# Patient Record
Sex: Female | Born: 1940 | Race: White | Hispanic: No | Marital: Married | Smoking: Never smoker
Health system: Southern US, Community
[De-identification: ages and names within clinical notes are randomized; demographics above are authoritative.]

## PROBLEM LIST (undated history)

## (undated) DIAGNOSIS — E785 Hyperlipidemia, unspecified: Secondary | ICD-10-CM

## (undated) DIAGNOSIS — I5189 Other ill-defined heart diseases: Secondary | ICD-10-CM

## (undated) DIAGNOSIS — I10 Essential (primary) hypertension: Secondary | ICD-10-CM

## (undated) DIAGNOSIS — K760 Fatty (change of) liver, not elsewhere classified: Secondary | ICD-10-CM

## (undated) DIAGNOSIS — I2510 Atherosclerotic heart disease of native coronary artery without angina pectoris: Secondary | ICD-10-CM

## (undated) DIAGNOSIS — M1990 Unspecified osteoarthritis, unspecified site: Secondary | ICD-10-CM

## (undated) DIAGNOSIS — I700 Atherosclerosis of aorta: Secondary | ICD-10-CM

## (undated) DIAGNOSIS — C801 Malignant (primary) neoplasm, unspecified: Secondary | ICD-10-CM

## (undated) DIAGNOSIS — Z87442 Personal history of urinary calculi: Secondary | ICD-10-CM

## (undated) DIAGNOSIS — K219 Gastro-esophageal reflux disease without esophagitis: Secondary | ICD-10-CM

## (undated) DIAGNOSIS — M5416 Radiculopathy, lumbar region: Secondary | ICD-10-CM

## (undated) DIAGNOSIS — E119 Type 2 diabetes mellitus without complications: Secondary | ICD-10-CM

## (undated) DIAGNOSIS — I739 Peripheral vascular disease, unspecified: Secondary | ICD-10-CM

## (undated) DIAGNOSIS — R471 Dysarthria and anarthria: Secondary | ICD-10-CM

## (undated) DIAGNOSIS — N200 Calculus of kidney: Secondary | ICD-10-CM

## (undated) DIAGNOSIS — G8929 Other chronic pain: Secondary | ICD-10-CM

## (undated) DIAGNOSIS — G458 Other transient cerebral ischemic attacks and related syndromes: Secondary | ICD-10-CM

## (undated) DIAGNOSIS — N183 Chronic kidney disease, stage 3 unspecified: Secondary | ICD-10-CM

## (undated) DIAGNOSIS — F325 Major depressive disorder, single episode, in full remission: Secondary | ICD-10-CM

## (undated) DIAGNOSIS — I6529 Occlusion and stenosis of unspecified carotid artery: Secondary | ICD-10-CM

## (undated) DIAGNOSIS — I209 Angina pectoris, unspecified: Secondary | ICD-10-CM

## (undated) DIAGNOSIS — Z90710 Acquired absence of both cervix and uterus: Secondary | ICD-10-CM

## (undated) HISTORY — DX: Occlusion and stenosis of unspecified carotid artery: I65.29

## (undated) HISTORY — PX: ABDOMINAL HYSTERECTOMY: SHX81

## (undated) HISTORY — DX: Essential (primary) hypertension: I10

## (undated) HISTORY — DX: Unspecified osteoarthritis, unspecified site: M19.90

## (undated) HISTORY — DX: Calculus of kidney: N20.0

## (undated) HISTORY — DX: Acquired absence of both cervix and uterus: Z90.710

## (undated) HISTORY — PX: CHOLECYSTECTOMY: SHX55

## (undated) HISTORY — DX: Gastro-esophageal reflux disease without esophagitis: K21.9

## (undated) HISTORY — DX: Type 2 diabetes mellitus without complications: E11.9

## (undated) HISTORY — DX: Other transient cerebral ischemic attacks and related syndromes: G45.8

---

## 1978-08-08 HISTORY — PX: AUGMENTATION MAMMAPLASTY: SUR837

## 2000-12-12 ENCOUNTER — Encounter: Payer: Self-pay | Admitting: Neurosurgery

## 2000-12-12 ENCOUNTER — Ambulatory Visit (HOSPITAL_COMMUNITY): Admission: RE | Admit: 2000-12-12 | Discharge: 2000-12-12 | Payer: Self-pay | Admitting: Neurosurgery

## 2000-12-22 ENCOUNTER — Encounter: Admission: RE | Admit: 2000-12-22 | Discharge: 2000-12-22 | Payer: Self-pay | Admitting: Neurosurgery

## 2000-12-22 ENCOUNTER — Encounter: Payer: Self-pay | Admitting: Neurosurgery

## 2001-01-05 ENCOUNTER — Encounter: Admission: RE | Admit: 2001-01-05 | Discharge: 2001-01-05 | Payer: Self-pay | Admitting: Neurosurgery

## 2001-01-05 ENCOUNTER — Encounter: Payer: Self-pay | Admitting: Neurosurgery

## 2001-01-18 ENCOUNTER — Encounter: Payer: Self-pay | Admitting: Neurosurgery

## 2001-01-18 ENCOUNTER — Encounter: Admission: RE | Admit: 2001-01-18 | Discharge: 2001-01-18 | Payer: Self-pay | Admitting: Neurosurgery

## 2004-07-21 ENCOUNTER — Ambulatory Visit: Payer: Self-pay | Admitting: Unknown Physician Specialty

## 2004-12-21 ENCOUNTER — Ambulatory Visit: Payer: Self-pay | Admitting: Unknown Physician Specialty

## 2004-12-23 ENCOUNTER — Ambulatory Visit: Payer: Self-pay | Admitting: Unknown Physician Specialty

## 2005-04-12 ENCOUNTER — Ambulatory Visit: Payer: Self-pay | Admitting: Unknown Physician Specialty

## 2005-08-03 ENCOUNTER — Ambulatory Visit: Payer: Self-pay | Admitting: Unknown Physician Specialty

## 2005-09-29 ENCOUNTER — Encounter: Admission: RE | Admit: 2005-09-29 | Discharge: 2005-09-29 | Payer: Self-pay | Admitting: Neurosurgery

## 2005-10-07 ENCOUNTER — Encounter: Admission: RE | Admit: 2005-10-07 | Discharge: 2005-10-07 | Payer: Self-pay | Admitting: Urology

## 2005-10-19 ENCOUNTER — Encounter: Admission: RE | Admit: 2005-10-19 | Discharge: 2005-10-19 | Payer: Self-pay | Admitting: Neurosurgery

## 2006-08-25 ENCOUNTER — Ambulatory Visit: Payer: Self-pay | Admitting: Unknown Physician Specialty

## 2007-09-19 ENCOUNTER — Ambulatory Visit: Payer: Self-pay | Admitting: Unknown Physician Specialty

## 2008-09-25 ENCOUNTER — Ambulatory Visit: Payer: Self-pay | Admitting: Unknown Physician Specialty

## 2009-09-30 ENCOUNTER — Ambulatory Visit: Payer: Self-pay | Admitting: Unknown Physician Specialty

## 2009-10-29 ENCOUNTER — Ambulatory Visit: Payer: Self-pay | Admitting: Ophthalmology

## 2009-11-11 ENCOUNTER — Ambulatory Visit: Payer: Self-pay | Admitting: Ophthalmology

## 2009-12-15 ENCOUNTER — Ambulatory Visit: Payer: Self-pay | Admitting: Unknown Physician Specialty

## 2010-01-06 DIAGNOSIS — I779 Disorder of arteries and arterioles, unspecified: Secondary | ICD-10-CM | POA: Insufficient documentation

## 2010-02-01 DIAGNOSIS — I6529 Occlusion and stenosis of unspecified carotid artery: Secondary | ICD-10-CM

## 2010-02-01 HISTORY — DX: Occlusion and stenosis of unspecified carotid artery: I65.29

## 2010-08-29 ENCOUNTER — Encounter: Payer: Self-pay | Admitting: Urology

## 2010-10-21 ENCOUNTER — Ambulatory Visit: Payer: Self-pay | Admitting: Unknown Physician Specialty

## 2010-11-18 ENCOUNTER — Ambulatory Visit: Payer: Self-pay | Admitting: Unknown Physician Specialty

## 2011-11-11 ENCOUNTER — Ambulatory Visit: Payer: Self-pay | Admitting: Internal Medicine

## 2012-05-11 ENCOUNTER — Ambulatory Visit: Payer: Self-pay

## 2012-11-14 ENCOUNTER — Ambulatory Visit: Payer: Self-pay

## 2013-01-29 ENCOUNTER — Ambulatory Visit: Payer: Self-pay | Admitting: Physician Assistant

## 2013-07-29 ENCOUNTER — Ambulatory Visit: Payer: Self-pay | Admitting: Podiatry

## 2013-11-15 ENCOUNTER — Ambulatory Visit: Payer: Self-pay

## 2014-01-15 ENCOUNTER — Ambulatory Visit: Payer: Self-pay | Admitting: Ophthalmology

## 2014-02-03 ENCOUNTER — Ambulatory Visit: Payer: Self-pay | Admitting: Ophthalmology

## 2014-04-19 ENCOUNTER — Emergency Department: Payer: Self-pay | Admitting: Emergency Medicine

## 2014-04-19 LAB — COMPREHENSIVE METABOLIC PANEL
Albumin: 4 g/dL (ref 3.4–5.0)
Alkaline Phosphatase: 65 U/L
Anion Gap: 8 (ref 7–16)
BUN: 21 mg/dL — ABNORMAL HIGH (ref 7–18)
Bilirubin,Total: 0.6 mg/dL (ref 0.2–1.0)
CHLORIDE: 102 mmol/L (ref 98–107)
CO2: 26 mmol/L (ref 21–32)
Calcium, Total: 8.6 mg/dL (ref 8.5–10.1)
Creatinine: 1.09 mg/dL (ref 0.60–1.30)
EGFR (Non-African Amer.): 50 — ABNORMAL LOW
GFR CALC AF AMER: 58 — AB
GLUCOSE: 131 mg/dL — AB (ref 65–99)
OSMOLALITY: 277 (ref 275–301)
POTASSIUM: 3.5 mmol/L (ref 3.5–5.1)
SGOT(AST): 26 U/L (ref 15–37)
SGPT (ALT): 71 U/L — ABNORMAL HIGH
SODIUM: 136 mmol/L (ref 136–145)
TOTAL PROTEIN: 8.1 g/dL (ref 6.4–8.2)

## 2014-04-19 LAB — URINALYSIS, COMPLETE
BLOOD: NEGATIVE
Bacteria: NONE SEEN
Bilirubin,UR: NEGATIVE
GLUCOSE, UR: NEGATIVE mg/dL (ref 0–75)
Hyaline Cast: 9
KETONE: NEGATIVE
Leukocyte Esterase: NEGATIVE
Nitrite: NEGATIVE
Ph: 5 (ref 4.5–8.0)
RBC,UR: 4 /HPF (ref 0–5)
SPECIFIC GRAVITY: 1.022 (ref 1.003–1.030)
WBC UR: 4 /HPF (ref 0–5)

## 2014-04-19 LAB — CBC WITH DIFFERENTIAL/PLATELET
BASOS PCT: 0.4 %
Basophil #: 0.1 10*3/uL (ref 0.0–0.1)
EOS PCT: 0.1 %
Eosinophil #: 0 10*3/uL (ref 0.0–0.7)
HCT: 46.4 % (ref 35.0–47.0)
HGB: 15.3 g/dL (ref 12.0–16.0)
Lymphocyte #: 1.8 10*3/uL (ref 1.0–3.6)
Lymphocyte %: 13.9 %
MCH: 30.5 pg (ref 26.0–34.0)
MCHC: 33 g/dL (ref 32.0–36.0)
MCV: 93 fL (ref 80–100)
MONO ABS: 0.8 x10 3/mm (ref 0.2–0.9)
MONOS PCT: 6.2 %
NEUTROS ABS: 10.5 10*3/uL — AB (ref 1.4–6.5)
Neutrophil %: 79.4 %
PLATELETS: 289 10*3/uL (ref 150–440)
RBC: 5.02 10*6/uL (ref 3.80–5.20)
RDW: 13.5 % (ref 11.5–14.5)
WBC: 13.2 10*3/uL — AB (ref 3.6–11.0)

## 2014-04-19 LAB — LIPASE, BLOOD: Lipase: 161 U/L (ref 73–393)

## 2014-11-18 ENCOUNTER — Ambulatory Visit
Admit: 2014-11-18 | Disposition: A | Discharge status: Home / Self Care | Payer: Self-pay | Attending: Infectious Diseases | Admitting: Infectious Diseases

## 2014-11-29 NOTE — Op Note (Signed)
PATIENT NAME:  Cassidy Hernandez, Cassidy Hernandez MR#:  [id number] DATE OF BIRTH:  [birth year]  DATE OF PROCEDURE:  02/03/2014  PREOPERATIVE DIAGNOSIS:  Cataract, right eye.   POSTOPERATIVE DIAGNOSIS:  Cataract, right eye.  PROCEDURE PERFORMED:  Extracapsular cataract extraction using phacoemulsification with placement of an Alcon [id number], 23.0-diopter posterior chamber lens, serial #[id number].  SURGEON:  Loura Back. Dingeldein, MD  ASSISTANT:  None.  ANESTHESIA:  4% lidocaine and 0.75% Marcaine in a 50/50 mixture with 10 units/mL of Hylenex added, given as a peribulbar.   ANESTHESIOLOGIST:  Dr. Julianne Handler   COMPLICATIONS:  None.  ESTIMATED BLOOD LOSS:  Less than 1 ml.  DESCRIPTION OF PROCEDURE:  The patient was brought to the operating room and given a peribulbar block.  The patient was then prepped and draped in the usual fashion.  The vertical rectus muscles were imbricated using 5-0 silk sutures.  These sutures were then clamped to the sterile drapes as bridle sutures.  A limbal peritomy was performed extending two clock hours and hemostasis was obtained with cautery.  A partial thickness scleral groove was made at the surgical limbus and dissected anteriorly in a lamellar dissection using an Alcon crescent knife.  The anterior chamber was entered superonasally with a Superblade and through the lamellar dissection with a 2.6 mm keratome.  DisCoVisc was used to replace the aqueous and a continuous tear capsulorrhexis was carried out.  Hydrodissection and hydrodelineation were carried out with balanced salt and a 27 gauge canula.  The nucleus was rotated to confirm the effectiveness of the hydrodissection.  Phacoemulsification was carried out using a divide-and-conquer technique.  Total ultrasound time was 1 minute and 6.1 seconds with an average power of 18.3 percent and CDE of 19.69.  Irrigation/aspiration was used to remove the residual cortex.  DisCoVisc was used to inflate the capsule and the  internal incision was enlarged to 3 mm with the crescent knife.  The intraocular lens was folded and inserted into the capsular bag using the AcrySert delivery system. Irrigation/aspiration was used to remove the residual DisCoVisc.  Miostat was injected into the anterior chamber through the paracentesis track to inflate the anterior chamber and induce miosis.  A tenth of a milliliter of cefuroxime was injected via the paracentesis tract containing 1 mg of drug. The wound was checked for leaks and none were found. The conjunctiva was closed with cautery and the bridle sutures were removed.  Two drops of 0.3% Vigamox were placed on the eye.   An eye shield was placed on the eye.  The patient was discharged to the recovery room in good condition.  ____________________________ Loura Back Dingeldein, MD sad:sb D: 02/03/2014 13:32:12 ET T: 02/03/2014 14:20:12 ET JOB#: [id number]  cc: Remo Lipps A. Dingeldein, MD, <Dictator> Martie Lee MD ELECTRONICALLY SIGNED 02/05/2014 11:28

## 2014-12-24 HISTORY — PX: CARDIAC CATHETERIZATION: SHX172

## 2014-12-29 ENCOUNTER — Encounter: Payer: Self-pay | Admitting: *Deleted

## 2014-12-29 ENCOUNTER — Encounter: Payer: Medicare Other | Attending: Internal Medicine | Admitting: *Deleted

## 2014-12-29 VITALS — Ht 61.1 in | Wt 182.3 lb

## 2014-12-29 DIAGNOSIS — I208 Other forms of angina pectoris: Secondary | ICD-10-CM | POA: Diagnosis present

## 2014-12-29 NOTE — Patient Instructions (Signed)
Stable Angina Exercise Prescription  Frequency: 3-7 d/wk Intensity: HR 10-15 contractions/min below ischemic threshold; threshold at which symptoms occur  Time: 10-60 minutes per exercise bout Type: Aerobic activity: walking, biking, NuStep  A prolonged warm-up and cool-down (10 min or more) has been shown to have an antianginal effect.   It is very important that the patient be able to define angina, describe his or her unique symptoms, identify provoking factors, describe the immediate treatment of angina (nitroglycerin protocol), and understand his or her upper limits of exercise (including HR, RPE, and angina).

## 2014-12-29 NOTE — Progress Notes (Signed)
Cardiac Individual Treatment Plan  Patient Details  Name: Cassidy Hernandez MRN: [id number] Date of Birth: [birth year] Referring Provider:  Luana Shu  Initial Encounter Date:  12/29/2014  Patient's Home Medications on Admission:  Current outpatient prescriptions:  .  amLODipine (NORVASC) 5 MG tablet, Take by mouth., Disp: , Rfl:  .  atenolol (TENORMIN) 50 MG tablet, Take by mouth., Disp: , Rfl:  .  chlorthalidone (HYGROTON) 25 MG tablet, TAKE 1/2 TABLET EVERY DAY, Disp: , Rfl:  .  nitroGLYCERIN (NITROSTAT) 0.4 MG SL tablet, Place under the tongue., Disp: , Rfl:  .  rosuvastatin (CRESTOR) 20 MG tablet, Take by mouth., Disp: , Rfl:  .  aspirin EC 81 MG tablet, Take by mouth., Disp: , Rfl:  .  losartan (COZAAR) 100 MG tablet, Take by mouth., Disp: , Rfl:  .  Multiple Vitamin (MULTIVITAMIN) capsule, Take by mouth., Disp: , Rfl:  .  omeprazole (PRILOSEC) 20 MG capsule, Take by mouth., Disp: , Rfl:  .  zolpidem (AMBIEN) 10 MG tablet, Take by mouth., Disp: , Rfl:   Past Medical History: No past medical history on file.  Tobacco Use: History  Smoking status  . Not on file  Smokeless tobacco  . Not on file    Labs: Recent Review Flowsheet Data    There is no flowsheet data to display.       Exercise Target Goals:    Exercise Program Goal: Individual exercise prescription set with THRR, safety & activity barriers. Participant demonstrates ability to understand and report RPE using BORG scale, to self-measure pulse accurately, and to acknowledge the importance of the exercise prescription.  Exercise Prescription Goal: Starting with aerobic activity 30 plus minutes a day, 3 days per week for initial exercise prescription. Provide home exercise prescription and guidelines that participant acknowledges understanding prior to discharge.  Activity Barriers & Risk Stratification:     Activity Barriers & Risk Stratification - 12/29/14 1422    Activity Barriers & Risk Stratification    Activity Barriers None   Risk Stratification High      6 Minute Walk:   Initial Exercise Prescription:   Exercise Prescription Changes:   Discharge Exercise Prescription:   Nutrition:  Target Goals: Understanding of nutrition guidelines, daily intake of sodium 1500mg , cholesterol 200mg , calories 30% from fat and 7% or less from saturated fats, daily to have 5 or more servings of fruits and vegetables.  Biometrics:    Nutrition Therapy Plan and Nutrition Goals:   Nutrition Discharge: Rate Your Plate Scores:   Nutrition Goals Re-Evaluation:   Psychosocial: Target Goals: Acknowledge presence or absence of depression, maximize coping skills, provide positive support system. Participant is able to verbalize types and ability to use techniques and skills needed for reducing stress and depression.  Initial Review & Psychosocial Screening:   Quality of Life Scores:   PHQ-9:     Recent Review Flowsheet Data    There is no flowsheet data to display.      Psychosocial Evaluation and Intervention:   Psychosocial Re-Evaluation:   Vocational Rehabilitation: Provide vocational rehab assistance to qualifying candidates.   Vocational Rehab Evaluation & Intervention:     Vocational Rehab - 12/29/14 1423    Initial Vocational Rehab Evaluation & Intervention   Assessment shows need for Vocational Rehabilitation No      Education: Education Goals: Education classes will be provided on a weekly basis, covering required topics. Participant will state understanding/return demonstration of topics presented.  Learning Barriers/Preferences:  Learning Barriers/Preferences - 12/29/14 1423    Learning Barriers/Preferences   Learning Barriers None;Reading      Education Topics: General Nutrition Guidelines/Fats and Fiber: -Group instruction provided by verbal, written material, models and posters to present the general guidelines for heart healthy nutrition.  Gives an explanation and review of dietary fats and fiber.   Controlling Sodium/Reading Food Labels: -Group verbal and written material supporting the discussion of sodium use in heart healthy nutrition. Review and explanation with models, verbal and written materials for utilization of the food label.   Exercise Physiology & Risk Factors: - Group verbal and written instruction with models to review the exercise physiology of the cardiovascular system and associated critical values. Details cardiovascular disease risk factors and the goals associated with each risk factor.   Aerobic Exercise & Resistance Training: - Gives group verbal and written discussion on the health impact of inactivity. On the components of aerobic and resistive training programs and the benefits of this training and how to safely progress through these programs.   Flexibility, Balance, General Exercise Guidelines: - Provides group verbal and written instruction on the benefits of flexibility and balance training programs. Provides general exercise guidelines with specific guidelines to those with heart or lung disease. Demonstration and skill practice provided.   Stress Management: - Provides group verbal and written instruction about the health risks of elevated stress, cause of high stress, and healthy ways to reduce stress.   Depression: - Provides group verbal and written instruction on the correlation between heart/lung disease and depressed mood, treatment options, and the stigmas associated with seeking treatment.   Anatomy & Physiology of the Heart: - Group verbal and written instruction and models provide basic cardiac anatomy and physiology, with the coronary electrical and arterial systems. Review of: AMI, Angina, Valve disease, Heart Failure, Cardiac Arrhythmia, Pacemakers, and the ICD.   Cardiac Procedures: - Group verbal and written instruction and models to describe the testing methods done to  diagnose heart disease. Reviews the outcomes of the test results. Describes the treatment choices: Medical Management, Angioplasty, or Coronary Bypass Surgery.   Cardiac Medications: - Group verbal and written instruction to review commonly prescribed medications for heart disease. Reviews the medication, class of the drug, and side effects. Includes the steps to properly store meds and maintain the prescription regimen.   Go Sex-Intimacy & Heart Disease, Get SMART - Goal Setting: - Group verbal and written instruction through game format to discuss heart disease and the return to sexual intimacy. Provides group verbal and written material to discuss and apply goal setting through the application of the S.M.A.R.T. Method.   Other Matters of the Heart: - Provides group verbal, written materials and models to describe Heart Failure, Angina, Valve Disease, and Diabetes in the realm of heart disease. Includes description of the disease process and treatment options available to the cardiac patient.   Exercise & Equipment Safety: - Individual verbal instruction and demonstration of equipment use and safety with use of the equipment.          Most Recent Value   Date  12/29/14   Educator  [email or web address]   Instruction Review Code  2- meets goals/outcomes      Infection Prevention: - Provides verbal and written material to individual with discussion of infection control including proper hand washing and proper equipment cleaning during exercise session.      Most Recent Value   Date  12/29/14   Educator  C.[place]   Instruction Review Code  2- meets goals/outcomes      Falls Prevention: - Provides verbal and written material to individual with discussion of falls prevention and safety.      Most Recent Value   Date  12/29/14   Educator  C. [place]   Instruction Review Code  2- meets goals/outcomes      Diabetes: - Individual verbal and written instruction to review  signs/symptoms of diabetes, desired ranges of glucose level fasting, after meals and with exercise. Advice that pre and post exercise glucose checks will be done for 3 sessions at entry of program.    Knowledge Questionnaire Score:   Personal Goals and Risk Factors at Admission:     Personal Goals and Risk Factors at Admission - 12/29/14 1445    Personal Goals and Risk Factors on Admission    Weight Management Yes   Intervention Learn and follow the exercise and diet guidelines while in the program. Utilize the nutrition and education classes to help gain knowledge of the diet and exercise expectations in the program   Diabetes No   Hypertension Yes   Goal Participant will see blood pressure controlled within the values of 140/29mm/Hg or within value directed by their physician.   Intervention Provide nutrition & aerobic exercise along with prescribed medications to achieve BP 140/90 or less.   Lipids Yes   Goal Cholesterol controlled with medications as prescribed, with individualized exercise RX and with personalized nutrition plan. Value goals: LDL < 70mg , HDL > 40mg . Participant states understanding of desired cholesterol values and following prescriptions.   Intervention Provide nutrition & aerobic exercise along with prescribed medications to achieve LDL 70mg , HDL >40mg .      Personal Goals and Risk Factors Review:    Personal Goals Discharge:     Comments: 5/23/

## 2014-12-30 ENCOUNTER — Encounter: Payer: Self-pay | Admitting: *Deleted

## 2014-12-30 NOTE — Progress Notes (Signed)
Cardiac Individual Treatment Plan  Patient Details  Name: Cassidy Hernandez MRN: [id number] Date of Birth: [birth year] Referring Provider:Dr. Loleta Chance Initial Encounter Date:  12/29/2014 DIagnosis Stable Angina  Patient's Home Medications on Admission:  Current outpatient prescriptions:  .  amLODipine (NORVASC) 5 MG tablet, Take by mouth., Disp: , Rfl:  .  aspirin EC 81 MG tablet, Take by mouth., Disp: , Rfl:  .  atenolol (TENORMIN) 50 MG tablet, Take by mouth., Disp: , Rfl:  .  chlorthalidone (HYGROTON) 25 MG tablet, TAKE 1/2 TABLET EVERY DAY, Disp: , Rfl:  .  losartan (COZAAR) 100 MG tablet, Take by mouth., Disp: , Rfl:  .  Multiple Vitamin (MULTIVITAMIN) capsule, Take by mouth., Disp: , Rfl:  .  nitroGLYCERIN (NITROSTAT) 0.4 MG SL tablet, Place under the tongue., Disp: , Rfl:  .  omeprazole (PRILOSEC) 20 MG capsule, Take by mouth., Disp: , Rfl:  .  rosuvastatin (CRESTOR) 20 MG tablet, Take by mouth., Disp: , Rfl:  .  zolpidem (AMBIEN) 10 MG tablet, Take by mouth., Disp: , Rfl:   Past Medical History: Past Medical History  Diagnosis Date  . Hypertension   . Kidney stone   . Subclavian steal syndrome   . 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 Degenerative arthritis   . GERD (gastroesophageal reflux disease)   . H/O: hysterectomy     Tobacco Use: History  Smoking status  . Never Smoker   Smokeless tobacco  . Not on file    Labs: Recent Review Flowsheet Data    There is no flowsheet data to display.       Exercise Target Goals:    Exercise Program Goal: Individual exercise prescription set with THRR, safety & activity barriers. Participant demonstrates ability to understand and report RPE using BORG scale, to self-measure pulse accurately, and to acknowledge the importance of the exercise prescription.  Exercise Prescription Goal: Starting with aerobic activity 30 plus minutes a day, 3 days per week for initial exercise prescription. Provide home exercise  prescription and guidelines that participant acknowledges understanding prior to discharge.  Activity Barriers & Risk Stratification:     Activity Barriers & Risk Stratification - 12/29/14 1422    Activity Barriers & Risk Stratification   Activity Barriers None   Risk Stratification High      6 Minute Walk:     6 Minute Walk      12/29/14 1510       6 Minute Walk   Phase Initial     Distance 1180 feet     Walk Time 6 minutes     Resting HR 65 bpm     Resting BP 142/78 mmHg     Max Ex. HR 95 bpm     Max Ex. BP 158/68 mmHg     RPE 11     Symptoms No        Initial Exercise Prescription:     Initial Exercise Prescription - 12/29/14 1500    Date of Initial Exercise Prescription   Date 12/29/14   Treadmill   MPH 2.2   Grade 0   Minutes 10   Bike   Level 0.4   Watts 12   Minutes 10   Recumbant Bike   Level 3   RPM 40   Watts 25   Minutes 15   NuStep   Level 3   Watts 40   Minutes 15   Arm Ergometer   Level 1   Watts 8  Minutes 10   Arm/Foot Ergometer   Level 4   Watts 12   Minutes 10   Cybex   Level 2   RPM 50   Minutes 10   Recumbant Elliptical   Level 1   Watts 10   Minutes 10   Elliptical   Level 1   Speed 3   Minutes 1   REL-XR   Level 2   Watts 30   Minutes 15   Prescription Details   Frequency (times per week) 3   Duration Progress to 30 minutes of continuous aerobic without signs/symptoms of physical distress   Intensity   THRR REST +  --  10-15 contractions below ischemic threshold    Ratings of Perceived Exertion 11-15   Progression Continue progressive overload as per policy without signs/symptoms or physical distress.   Resistance Training   Training Prescription Yes   Weight 2   Reps 10-12      Exercise Prescription Changes:   Discharge Exercise Prescription:   Nutrition:  Target Goals: Understanding of nutrition guidelines, daily intake of sodium 1500mg , cholesterol 200mg , calories 30% from fat and 7% or  less from saturated fats, daily to have 5 or more servings of fruits and vegetables.  Biometrics:     Pre Biometrics - 12/29/14 1511    Pre Biometrics   Height 5' 1.1" (1.552 m)   Weight 182 lb 4.8 oz (82.691 kg)   Waist Circumference 40.25 inches   Hip Circumference 45.5 inches   Waist to Hip Ratio 0.88 %   BMI (Calculated) 34.4       Nutrition Therapy Plan and Nutrition Goals:   Nutrition Discharge: Rate Your Plate Scores:   Nutrition Goals Re-Evaluation:   Psychosocial: Target Goals: Acknowledge presence or absence of depression, maximize coping skills, provide positive support system. Participant is able to verbalize types and ability to use techniques and skills needed for reducing stress and depression.  Initial Review & Psychosocial Screening:     Initial Psych Review & Screening - 12/29/14 [place]? Yes   Barriers   Psychosocial barriers to participate in program There are no identifiable barriers or psychosocial needs.   Screening Interventions   Interventions Encouraged to exercise      Quality of Life Scores:     Quality of Life - 12/29/14 1502    Quality of Life Scores   Health/Function Pre 26.57 %   Socioeconomic Pre 28.21 %   Psych/Spiritual Pre 29.14 %   Family Pre 30 %   GLOBAL Pre 27.92 %      PHQ-9:     Recent Review Flowsheet Data    Depression screen [facility] 2/9 12/29/2014   Decreased Interest 0   Down, Depressed, Hopeless 0   PHQ - 2 Score 0   Altered sleeping 0   Tired, decreased energy 1   Change in appetite 2   Feeling bad or failure about yourself  0   Trouble concentrating 0   Moving slowly or fidgety/restless 0   Suicidal thoughts 0   PHQ-9 Score 3   Difficult doing work/chores Not difficult at all      Psychosocial Evaluation and Intervention:   Psychosocial Re-Evaluation:   Vocational Rehabilitation: Provide vocational rehab assistance to qualifying candidates.   Vocational  Rehab Evaluation & Intervention:     Vocational Rehab - 12/29/14 1423    Initial Vocational Rehab Evaluation & Intervention   Assessment shows need for  Vocational Rehabilitation No      Education: Education Goals: Education classes will be provided on a weekly basis, covering required topics. Participant will state understanding/return demonstration of topics presented.  Learning Barriers/Preferences:     Learning Barriers/Preferences - 12/29/14 1423    Learning Barriers/Preferences   Learning Barriers None;Reading      Education Topics: General Nutrition Guidelines/Fats and Fiber: -Group instruction provided by verbal, written material, models and posters to present the general guidelines for heart healthy nutrition. Gives an explanation and review of dietary fats and fiber.   Controlling Sodium/Reading Food Labels: -Group verbal and written material supporting the discussion of sodium use in heart healthy nutrition. Review and explanation with models, verbal and written materials for utilization of the food label.   Exercise Physiology & Risk Factors: - Group verbal and written instruction with models to review the exercise physiology of the cardiovascular system and associated critical values. Details cardiovascular disease risk factors and the goals associated with each risk factor.   Aerobic Exercise & Resistance Training: - Gives group verbal and written discussion on the health impact of inactivity. On the components of aerobic and resistive training programs and the benefits of this training and how to safely progress through these programs.   Flexibility, Balance, General Exercise Guidelines: - Provides group verbal and written instruction on the benefits of flexibility and balance training programs. Provides general exercise guidelines with specific guidelines to those with heart or lung disease. Demonstration and skill practice provided.   Stress Management: -  Provides group verbal and written instruction about the health risks of elevated stress, cause of high stress, and healthy ways to reduce stress.   Depression: - Provides group verbal and written instruction on the correlation between heart/lung disease and depressed mood, treatment options, and the stigmas associated with seeking treatment.   Anatomy & Physiology of the Heart: - Group verbal and written instruction and models provide basic cardiac anatomy and physiology, with the coronary electrical and arterial systems. Review of: AMI, Angina, Valve disease, Heart Failure, Cardiac Arrhythmia, Pacemakers, and the ICD.   Cardiac Procedures: - Group verbal and written instruction and models to describe the testing methods done to diagnose heart disease. Reviews the outcomes of the test results. Describes the treatment choices: Medical Management, Angioplasty, or Coronary Bypass Surgery.   Cardiac Medications: - Group verbal and written instruction to review commonly prescribed medications for heart disease. Reviews the medication, class of the drug, and side effects. Includes the steps to properly store meds and maintain the prescription regimen.   Go Sex-Intimacy & Heart Disease, Get SMART - Goal Setting: - Group verbal and written instruction through game format to discuss heart disease and the return to sexual intimacy. Provides group verbal and written material to discuss and apply goal setting through the application of the S.M.A.R.T. Method.   Other Matters of the Heart: - Provides group verbal, written materials and models to describe Heart Failure, Angina, Valve Disease, and Diabetes in the realm of heart disease. Includes description of the disease process and treatment options available to the cardiac patient.   Exercise & Equipment Safety: - Individual verbal instruction and demonstration of equipment use and safety with use of the equipment.   Infection Prevention: -  Provides verbal and written material to individual with discussion of infection control including proper hand washing and proper equipment cleaning during exercise session.   Falls Prevention: - Provides verbal and written material to individual with discussion of falls prevention and safety.  Diabetes: - Individual verbal and written instruction to review signs/symptoms of diabetes, desired ranges of glucose level fasting, after meals and with exercise. Advice that pre and post exercise glucose checks will be done for 3 sessions at entry of program.    Knowledge Questionnaire Score:     Knowledge Questionnaire Score - 12/29/14 1504    Knowledge Questionnaire Score   Pre Score 27      Personal Goals and Risk Factors at Admission:     Personal Goals and Risk Factors at Admission - 12/29/14 1445    Personal Goals and Risk Factors on Admission    Weight Management Yes   Intervention Learn and follow the exercise and diet guidelines while in the program. Utilize the nutrition and education classes to help gain knowledge of the diet and exercise expectations in the program   Diabetes No   Hypertension Yes   Goal Participant will see blood pressure controlled within the values of 140/75mm/Hg or within value directed by their physician.   Intervention Provide nutrition & aerobic exercise along with prescribed medications to achieve BP 140/90 or less.   Lipids Yes   Goal Cholesterol controlled with medications as prescribed, with individualized exercise RX and with personalized nutrition plan. Value goals: LDL < 70mg , HDL > 40mg . Participant states understanding of desired cholesterol values and following prescriptions.   Intervention Provide nutrition & aerobic exercise along with prescribed medications to achieve LDL 70mg , HDL >40mg .      Personal Goals and Risk Factors Review:    Personal Goals Discharge:     Comments: 30 day review  Orientation completed.  To start program  soon.

## 2015-01-01 ENCOUNTER — Other Ambulatory Visit: Payer: Self-pay | Admitting: *Deleted

## 2015-01-01 DIAGNOSIS — I208 Other forms of angina pectoris: Secondary | ICD-10-CM | POA: Diagnosis not present

## 2015-01-01 NOTE — Progress Notes (Deleted)
Discharge Summary  Patient Details  Name: Cassidy Hernandez MRN: [id number] Date of Birth: 1941/02/23 Referring Provider:  Rusty Aus, MD   Number of Visits: 36  Reason for Discharge:  Patient reached a stable level of exercise. Patient independent in their exercise.  Smoking History:  History  Smoking status  . Never Smoker   Smokeless tobacco  . Not on file    Diagnosis:  Stable angina  Initial Exercise Prescription:     Initial Exercise Prescription - 12/29/14 1500    Date of Initial Exercise Prescription   Date 12/29/14   Treadmill   MPH 2.2   Grade 0   Minutes 10   Bike   Level 0.4   Watts 12   Minutes 10   Recumbant Bike   Level 3   RPM 40   Watts 25   Minutes 15   NuStep   Level 3   Watts 40   Minutes 15   Arm Ergometer   Level 1   Watts 8   Minutes 10   Arm/Foot Ergometer   Level 4   Watts 12   Minutes 10   Cybex   Level 2   RPM 50   Minutes 10   Recumbant Elliptical   Level 1   Watts 10   Minutes 10   Elliptical   Level 1   Speed 3   Minutes 1   REL-XR   Level 2   Watts 30   Minutes 15   Prescription Details   Frequency (times per week) 3   Duration Progress to 30 minutes of continuous aerobic without signs/symptoms of physical distress   Intensity   THRR REST +  --  10-15 contractions below ischemic threshold    Ratings of Perceived Exertion 11-15   Progression Continue progressive overload as per policy without signs/symptoms or physical distress.   Resistance Training   Training Prescription Yes   Weight 2   Reps 10-12      Discharge Exercise Prescription:   Functional Capacity:     6 Minute Walk      12/29/14 1510       6 Minute Walk   Phase Initial     Distance 1180 feet     Walk Time 6 minutes     Resting HR 65 bpm     Resting BP 142/78 mmHg     Max Ex. HR 95 bpm     Max Ex. BP 158/68 mmHg     RPE 11     Symptoms No        Psychological, QOL, Others - Outcomes: PHQ 2/9: Depression screen PHQ  2/9 12/29/2014  Decreased Interest 0  Down, Depressed, Hopeless 0  PHQ - 2 Score 0  Altered sleeping 0  Tired, decreased energy 1  Change in appetite 2  Feeling bad or failure about yourself  0  Trouble concentrating 0  Moving slowly or fidgety/restless 0  Suicidal thoughts 0  PHQ-9 Score 3  Difficult doing work/chores Not difficult at all    Quality of Life:     Quality of Life - 12/29/14 1502    Quality of Life Scores   Health/Function Pre 26.57 %   Socioeconomic Pre 28.21 %   Psych/Spiritual Pre 29.14 %   Family Pre 30 %   GLOBAL Pre 27.92 %      Personal Goals: Goals established at orientation with interventions provided to work toward goal.     Personal Goals  and Risk Factors at Admission - 12/29/14 1445    Personal Goals and Risk Factors on Admission    Weight Management Yes   Intervention Learn and follow the exercise and diet guidelines while in the program. Utilize the nutrition and education classes to help gain knowledge of the diet and exercise expectations in the program   Diabetes No   Hypertension Yes   Goal Participant will see blood pressure controlled within the values of 140/24mm/Hg or within value directed by their physician.   Intervention Provide nutrition & aerobic exercise along with prescribed medications to achieve BP 140/90 or less.   Lipids Yes   Goal Cholesterol controlled with medications as prescribed, with individualized exercise RX and with personalized nutrition plan. Value goals: LDL < 70mg , HDL > 40mg . Participant states understanding of desired cholesterol values and following prescriptions.   Intervention Provide nutrition & aerobic exercise along with prescribed medications to achieve LDL 70mg , HDL >40mg .       Personal Goals Discharge:   Nutrition & Weight - Outcomes:     Pre Biometrics - 12/29/14 1511    Pre Biometrics   Height 5' 1.1" (1.552 m)   Weight 182 lb 4.8 oz (82.691 kg)   Waist Circumference 40.25 inches   Hip  Circumference 45.5 inches   Waist to Hip Ratio 0.88 %   BMI (Calculated) 34.4       Nutrition:   Nutrition Discharge:     Rate Your Plate - 07/31/81 5003    Rate Your Plate Scores   Pre Score 70      Education Questionnaire Score:     Knowledge Questionnaire Score - 12/29/14 1504    Knowledge Questionnaire Score   Pre Score 27      Goals reviewed with patient; copy given to patient.

## 2015-01-01 NOTE — Progress Notes (Signed)
Daily Session Note  Patient Details  Name: Cassidy Hernandez MRN: [id number] Date of Birth: June 19, 1941 Referring Provider:  Rusty Aus, MD  Encounter Date: 01/01/2015  Check In:     Session Check In - 01/01/15 1637    Check-In   Staff Present Lestine Box BS, ACSM EP-C, Exercise Physiologist;Diane Joya Gaskins RN, BSN;Carroll Enterkin RN, BSN   ER physicians immediately available to respond to emergencies See telemetry face sheet for immediately available ER MD   Medication changes reported     No   Fall or balance concerns reported    No   Warm-up and Cool-down Performed on first and last piece of equipment   VAD Patient? No   Pain Assessment   Currently in Pain? No/denies         Goals Met:  Proper associated with RPD/PD & O2 Sat Exercise tolerated well No report of cardiac concerns or symptoms Strength training completed today  Goals Unmet:  Not Applicable  Goals Comments:    Dr. Emily Filbert is Medical Director for [facility] and [facility].

## 2015-01-07 ENCOUNTER — Encounter: Payer: Medicare Other | Attending: Internal Medicine | Admitting: *Deleted

## 2015-01-07 DIAGNOSIS — I208 Other forms of angina pectoris: Secondary | ICD-10-CM | POA: Diagnosis present

## 2015-01-07 NOTE — Progress Notes (Signed)
Daily Session Note  Patient Details  Name: Cassidy Hernandez MRN: [id number] Date of Birth: 04/29/1941 Referring Provider:  Miller, Mark F, MD  Encounter Date: 01/07/2015  Check In:     Session Check In - 01/07/15 1638    Check-In   Staff Present   MS, ACSM CEP Exercise Physiologist;Carroll Enterkin RN, BSN;Diane Wright RN, BSN   ER physicians immediately available to respond to emergencies See telemetry face sheet for immediately available ER MD   Medication changes reported     No   Fall or balance concerns reported    No   Warm-up and Cool-down Performed on first and last piece of equipment   VAD Patient? No   Pain Assessment   Currently in Pain? No/denies   Multiple Pain Sites No         Goals Met:  Independence with exercise equipment Exercise tolerated well Personal goals reviewed No report of cardiac concerns or symptoms Strength training completed today  Goals Unmet:  Not Applicable  Goals Comments:   Dr. Mark Miller is Medical Director for [facility] and [facility]. 

## 2015-01-08 ENCOUNTER — Encounter: Payer: Medicare Other | Admitting: *Deleted

## 2015-01-08 DIAGNOSIS — I208 Other forms of angina pectoris: Secondary | ICD-10-CM

## 2015-01-08 NOTE — Progress Notes (Signed)
Daily Session Note ° °Patient Details  °Name: Cassidy Hernandez °MRN: [id number] °Date of Birth: 11/17/1940 °Referring Provider:  Miller, Mark F, MD ° °Encounter Date: 01/08/2015 ° °Check In: °  °  °Session Check In - 01/08/15 1738   ° Check-In  ° Staff Present Mary Jo Abernethy RN;Susanne Bice RN, BSN, CCRP;Diane Wright RN, BSN  ° ER physicians immediately available to respond to emergencies See telemetry face sheet for immediately available ER MD  ° Medication changes reported     No  ° Fall or balance concerns reported    No  ° Warm-up and Cool-down Performed on first and last piece of equipment  ° VAD Patient? No  ° Pain Assessment  ° Currently in Pain? No/denies  °  ° ° ° ° ° °Goals Met:  °Independence with exercise equipment °Exercise tolerated well °No report of cardiac concerns or symptoms °Strength training completed today ° °Goals Unmet:  °Not Applicable ° °Goals Comments:  ° ° °Dr. Mark Miller is Medical Director for [facility] and [facility]. °

## 2015-01-12 DIAGNOSIS — I208 Other forms of angina pectoris: Secondary | ICD-10-CM

## 2015-01-12 NOTE — Progress Notes (Signed)
Daily Session Note  Patient Details  Name: JAYLEE FREEZE MRN: [id number] Date of Birth: 1941/04/19 Referring Provider:  Rusty Aus, MD  Encounter Date: 01/12/2015  Check In:     Session Check In - 01/12/15 1624    Check-In   Staff Present Heath Lark RN, BSN, CCRP;Carmita Boom BS, ACSM EP-C, Exercise Physiologist;Carroll Radio producer, BSN   ER physicians immediately available to respond to emergencies See telemetry face sheet for immediately available ER MD   Medication changes reported     No   Fall or balance concerns reported    No   Warm-up and Cool-down Performed on first and last piece of equipment   VAD Patient? No   Pain Assessment   Currently in Pain? No/denies         Goals Met:  Proper associated with RPD/PD & O2 Sat Exercise tolerated well No report of cardiac concerns or symptoms Strength training completed today  Goals Unmet:  Not Applicable  Goals Comments:    Dr. Emily Filbert is Medical Director for [employer] and [facility].

## 2015-01-14 ENCOUNTER — Encounter: Payer: Medicare Other | Admitting: *Deleted

## 2015-01-14 DIAGNOSIS — I208 Other forms of angina pectoris: Secondary | ICD-10-CM | POA: Diagnosis not present

## 2015-01-14 NOTE — Progress Notes (Signed)
Daily Session Note  Patient Details  Name: Cassidy Hernandez MRN: [id number] Date of Birth: October 12, 1940 Referring Provider:  Rusty Aus, MD  Encounter Date: 01/14/2015  Check In:     Session Check In - 01/14/15 1657    Check-In   Staff Present Gerlene Burdock RN, BSN;Diane Joya Gaskins RN, BSN;Renee Dillard [place] MS, ACSM CEP Exercise Physiologist   ER physicians immediately available to respond to emergencies See telemetry face sheet for immediately available ER MD   Medication changes reported     No   Fall or balance concerns reported    No   Warm-up and Cool-down Performed on first and last piece of equipment   VAD Patient? No   Pain Assessment   Currently in Pain? No/denies         Goals Met:  Proper associated with RPD/PD & O2 Sat Exercise tolerated well  Goals Unmet:  Not Applicable  Goals Comments:    Dr. Emily Filbert is Medical Director for [facility] and [facility].

## 2015-01-15 DIAGNOSIS — I208 Other forms of angina pectoris: Secondary | ICD-10-CM | POA: Diagnosis not present

## 2015-01-15 NOTE — Progress Notes (Signed)
Cardiac Individual Treatment Plan  Patient Details  Name: Cassidy Hernandez MRN: [id number] Date of Birth: 12/29/1940 Referring Provider:  Rusty Aus, MD  Initial Encounter Date:    Visit Diagnosis: Stable angina  Patient's Home Medications on Admission:  Current outpatient prescriptions:  .  amLODipine (NORVASC) 5 MG tablet, Take by mouth., Disp: , Rfl:  .  aspirin EC 81 MG tablet, Take by mouth., Disp: , Rfl:  .  atenolol (TENORMIN) 50 MG tablet, Take by mouth., Disp: , Rfl:  .  chlorthalidone (HYGROTON) 25 MG tablet, TAKE 1/2 TABLET EVERY DAY, Disp: , Rfl:  .  losartan (COZAAR) 100 MG tablet, Take by mouth., Disp: , Rfl:  .  Multiple Vitamin (MULTIVITAMIN) capsule, Take by mouth., Disp: , Rfl:  .  nitroGLYCERIN (NITROSTAT) 0.4 MG SL tablet, Place under the tongue., Disp: , Rfl:  .  omeprazole (PRILOSEC) 20 MG capsule, Take by mouth., Disp: , Rfl:  .  rosuvastatin (CRESTOR) 20 MG tablet, Take by mouth., Disp: , Rfl:  .  zolpidem (AMBIEN) 10 MG tablet, Take by mouth., Disp: , Rfl:   Past Medical History: Past Medical History  Diagnosis Date  . Hypertension   . Kidney stone   . Subclavian steal syndrome   . 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 Degenerative arthritis   . GERD (gastroesophageal reflux disease)   . H/O: hysterectomy     Tobacco Use: History  Smoking status  . Never Smoker   Smokeless tobacco  . Not on file    Labs: Recent Review Flowsheet Data    There is no flowsheet data to display.       Exercise Target Goals:    Exercise Program Goal: Individual exercise prescription set with THRR, safety & activity barriers. Participant demonstrates ability to understand and report RPE using BORG scale, to self-measure pulse accurately, and to acknowledge the importance of the exercise prescription.  Exercise Prescription Goal: Starting with aerobic activity 30 plus minutes a day, 3 days per week for initial exercise prescription. Provide home  exercise prescription and guidelines that participant acknowledges understanding prior to discharge.  Activity Barriers & Risk Stratification:     Activity Barriers & Risk Stratification - 12/29/14 1422    Activity Barriers & Risk Stratification   Activity Barriers None   Risk Stratification High      6 Minute Walk:     6 Minute Walk      12/29/14 1510       6 Minute Walk   Phase Initial     Distance 1180 feet     Walk Time 6 minutes     Resting HR 65 bpm     Resting BP 142/78 mmHg     Max Ex. HR 95 bpm     Max Ex. BP 158/68 mmHg     RPE 11     Symptoms No        Initial Exercise Prescription:     Initial Exercise Prescription - 12/29/14 1500    Date of Initial Exercise Prescription   Date 12/29/14   Treadmill   MPH 2.2   Grade 0   Minutes 10   Bike   Level 0.4   Watts 12   Minutes 10   Recumbant Bike   Level 3   RPM 40   Watts 25   Minutes 15   NuStep   Level 3   Watts 40   Minutes 15   Arm Ergometer  Level 1   Watts 8   Minutes 10   Arm/Foot Ergometer   Level 4   Watts 12   Minutes 10   Cybex   Level 2   RPM 50   Minutes 10   Recumbant Elliptical   Level 1   Watts 10   Minutes 10   Elliptical   Level 1   Speed 3   Minutes 1   REL-XR   Level 2   Watts 30   Minutes 15   Prescription Details   Frequency (times per week) 3   Duration Progress to 30 minutes of continuous aerobic without signs/symptoms of physical distress   Intensity   THRR REST +  --  10-15 contractions below ischemic threshold    Ratings of Perceived Exertion 11-15   Progression Continue progressive overload as per policy without signs/symptoms or physical distress.   Resistance Training   Training Prescription Yes   Weight 2   Reps 10-12      Exercise Prescription Changes:     Exercise Prescription Changes      01/07/15 1600 01/14/15 1600         Exercise Review   Progression  Yes      Treadmill   MPH  2.5      Grade  0      NuStep   Level 3  3      Watts 45 45      Minutes 20 20         Discharge Exercise Prescription:   Nutrition:  Target Goals: Understanding of nutrition guidelines, daily intake of sodium 1500mg , cholesterol 200mg , calories 30% from fat and 7% or less from saturated fats, daily to have 5 or more servings of fruits and vegetables.  Biometrics:     Pre Biometrics - 12/29/14 1511    Pre Biometrics   Height 5' 1.1" (1.552 m)   Weight 182 lb 4.8 oz (82.691 kg)   Waist Circumference 40.25 inches   Hip Circumference 45.5 inches   Waist to Hip Ratio 0.88 %   BMI (Calculated) 34.4       Nutrition Therapy Plan and Nutrition Goals:   Nutrition Discharge: Rate Your Plate Scores:     Rate Your Plate - 35/36/14 4315    Rate Your Plate Scores   Pre Score 70      Nutrition Goals Re-Evaluation:     Nutrition Goals Re-Evaluation      01/15/15 1719           Personal Goal #1 Re-Evaluation   Personal Goal #1 Appt made with dietician. Schuyler said she goes out to eat since her husband died 2 years ago. Eats out alot with her friend. She said she used to be "very small".        Goal Progress Seen Yes          Psychosocial: Target Goals: Acknowledge presence or absence of depression, maximize coping skills, provide positive support system. Participant is able to verbalize types and ability to use techniques and skills needed for reducing stress and depression.  Initial Review & Psychosocial Screening:     Initial Psych Review & Screening - 12/29/14 [place]? Yes   Barriers   Psychosocial barriers to participate in program There are no identifiable barriers or psychosocial needs.   Screening Interventions   Interventions Encouraged to exercise      Quality of Life Scores:  Quality of Life - 12/29/14 1502    Quality of Life Scores   Health/Function Pre 26.57 %   Socioeconomic Pre 28.21 %   Psych/Spiritual Pre 29.14 %   Family Pre 30 %   GLOBAL  Pre 27.92 %      PHQ-9:     Recent Review Flowsheet Data    Depression screen [facility] 2/9 12/29/2014   Decreased Interest 0   Down, Depressed, Hopeless 0   PHQ - 2 Score 0   Altered sleeping 0   Tired, decreased energy 1   Change in appetite 2   Feeling bad or failure about yourself  0   Trouble concentrating 0   Moving slowly or fidgety/restless 0   Suicidal thoughts 0   PHQ-9 Score 3   Difficult doing work/chores Not difficult at all      Psychosocial Evaluation and Intervention:   Psychosocial Re-Evaluation:   Vocational Rehabilitation: Provide vocational rehab assistance to qualifying candidates.   Vocational Rehab Evaluation & Intervention:     Vocational Rehab - 12/29/14 1423    Initial Vocational Rehab Evaluation & Intervention   Assessment shows need for Vocational Rehabilitation No      Education: Education Goals: Education classes will be provided on a weekly basis, covering required topics. Participant will state understanding/return demonstration of topics presented.  Learning Barriers/Preferences:     Learning Barriers/Preferences - 12/29/14 1423    Learning Barriers/Preferences   Learning Barriers None;Reading      Education Topics: General Nutrition Guidelines/Fats and Fiber: -Group instruction provided by verbal, written material, models and posters to present the general guidelines for heart healthy nutrition. Gives an explanation and review of dietary fats and fiber.   Controlling Sodium/Reading Food Labels: -Group verbal and written material supporting the discussion of sodium use in heart healthy nutrition. Review and explanation with models, verbal and written materials for utilization of the food label.   Exercise Physiology & Risk Factors: - Group verbal and written instruction with models to review the exercise physiology of the cardiovascular system and associated critical values. Details cardiovascular disease risk factors and the  goals associated with each risk factor.   Aerobic Exercise & Resistance Training: - Gives group verbal and written discussion on the health impact of inactivity. On the components of aerobic and resistive training programs and the benefits of this training and how to safely progress through these programs.   Flexibility, Balance, General Exercise Guidelines: - Provides group verbal and written instruction on the benefits of flexibility and balance training programs. Provides general exercise guidelines with specific guidelines to those with heart or lung disease. Demonstration and skill practice provided.   Stress Management: - Provides group verbal and written instruction about the health risks of elevated stress, cause of high stress, and healthy ways to reduce stress.   Depression: - Provides group verbal and written instruction on the correlation between heart/lung disease and depressed mood, treatment options, and the stigmas associated with seeking treatment.   Anatomy & Physiology of the Heart: - Group verbal and written instruction and models provide basic cardiac anatomy and physiology, with the coronary electrical and arterial systems. Review of: AMI, Angina, Valve disease, Heart Failure, Cardiac Arrhythmia, Pacemakers, and the ICD.   Cardiac Procedures: - Group verbal and written instruction and models to describe the testing methods done to diagnose heart disease. Reviews the outcomes of the test results. Describes the treatment choices: Medical Management, Angioplasty, or Coronary Bypass Surgery.   Cardiac Medications: - Group verbal  and written instruction to review commonly prescribed medications for heart disease. Reviews the medication, class of the drug, and side effects. Includes the steps to properly store meds and maintain the prescription regimen.   Go Sex-Intimacy & Heart Disease, Get SMART - Goal Setting: - Group verbal and written instruction through game  format to discuss heart disease and the return to sexual intimacy. Provides group verbal and written material to discuss and apply goal setting through the application of the S.M.A.R.T. Method.   Other Matters of the Heart: - Provides group verbal, written materials and models to describe Heart Failure, Angina, Valve Disease, and Diabetes in the realm of heart disease. Includes description of the disease process and treatment options available to the cardiac patient.   Exercise & Equipment Safety: - Individual verbal instruction and demonstration of equipment use and safety with use of the equipment.   Infection Prevention: - Provides verbal and written material to individual with discussion of infection control including proper hand washing and proper equipment cleaning during exercise session.   Falls Prevention: - Provides verbal and written material to individual with discussion of falls prevention and safety.   Diabetes: - Individual verbal and written instruction to review signs/symptoms of diabetes, desired ranges of glucose level fasting, after meals and with exercise. Advice that pre and post exercise glucose checks will be done for 3 sessions at entry of program.    Knowledge Questionnaire Score:     Knowledge Questionnaire Score - 12/29/14 1504    Knowledge Questionnaire Score   Pre Score 27      Personal Goals and Risk Factors at Admission:     Personal Goals and Risk Factors at Admission - 12/29/14 1445    Personal Goals and Risk Factors on Admission    Weight Management Yes   Intervention Learn and follow the exercise and diet guidelines while in the program. Utilize the nutrition and education classes to help gain knowledge of the diet and exercise expectations in the program   Diabetes No   Hypertension Yes   Goal Participant will see blood pressure controlled within the values of 140/59mm/Hg or within value directed by their physician.   Intervention  Provide nutrition & aerobic exercise along with prescribed medications to achieve BP 140/90 or less.   Lipids Yes   Goal Cholesterol controlled with medications as prescribed, with individualized exercise RX and with personalized nutrition plan. Value goals: LDL < 70mg , HDL > 40mg . Participant states understanding of desired cholesterol values and following prescriptions.   Intervention Provide nutrition & aerobic exercise along with prescribed medications to achieve LDL 70mg , HDL >40mg .      Personal Goals and Risk Factors Review:      Goals and Risk Factor Review      01/15/15 1720           Increase Aerobic Exercise and Physical Activity   Goals Progress/Improvement seen  Yes          Personal Goals Discharge:     Comments: Has an appt with Cardiac Rehab Registered dietician.

## 2015-01-15 NOTE — Progress Notes (Signed)
Daily Session Note  Patient Details  Name: TAESHA GOODELL MRN: [id number] Date of Birth: 31-Aug-1940 Referring Provider:  Rusty Aus, MD  Encounter Date: 01/15/2015  Check In:     Session Check In - 01/15/15 1615    Check-In   Staff Present Lestine Box BS, ACSM EP-C, Exercise Physiologist;Diane Joya Gaskins RN, BSN;Carroll Enterkin RN, BSN   ER physicians immediately available to respond to emergencies See telemetry face sheet for immediately available ER MD   Medication changes reported     No   Fall or balance concerns reported    No   Warm-up and Cool-down Performed on first and last piece of equipment   Pain Assessment   Currently in Pain? No/denies         Goals Met:  Proper associated with RPD/PD & O2 Sat Exercise tolerated well No report of cardiac concerns or symptoms Strength training completed today  Goals Unmet:  Not Applicable  Goals Comments:    Dr. Emily Filbert is Medical Director for [place] and [facility].

## 2015-01-19 DIAGNOSIS — I208 Other forms of angina pectoris: Secondary | ICD-10-CM

## 2015-01-19 NOTE — Progress Notes (Signed)
Daily Session Note  Patient Details  Name: Cassidy Hernandez MRN: [id number] Date of Birth: [birth year] Referring Provider:  Rusty Aus, MD  Encounter Date: 01/19/2015  Check In:     Session Check In - 01/19/15 1620    Check-In   Staff Present Heath Lark RN, BSN, CCRP;Carroll Enterkin RN, BSN;Justino Boze BS, ACSM EP-C, Exercise Physiologist   ER physicians immediately available to respond to emergencies See telemetry face sheet for immediately available ER MD   Medication changes reported     No   Fall or balance concerns reported    No   Warm-up and Cool-down Performed on first and last piece of equipment   VAD Patient? No   Pain Assessment   Currently in Pain? No/denies         Goals Met:  Proper associated with RPD/PD & O2 Sat Exercise tolerated well No report of cardiac concerns or symptoms Strength training completed today  Goals Unmet:  Not Applicable  Goals Comments:    Dr. Emily Filbert is Medical Director for [place] and [facility].

## 2015-01-19 NOTE — Progress Notes (Signed)
Daily Session Note  Patient Details  Name: Cassidy Hernandez MRN: [id number] Date of Birth: 08/22/1940 Referring Provider:  Rusty Aus, MD  Encounter Date: 01/19/2015  Check In:     Session Check In - 01/19/15 1620    Check-In   Staff Present Heath Lark RN, BSN, CCRP;Carroll Enterkin RN, BSN;Steven Way BS, ACSM EP-C, Exercise Physiologist   ER physicians immediately available to respond to emergencies See telemetry face sheet for immediately available ER MD   Medication changes reported     No   Fall or balance concerns reported    No   Warm-up and Cool-down Performed on first and last piece of equipment   VAD Patient? No   Pain Assessment   Currently in Pain? No/denies         Goals Met:  Exercise tolerated well No report of cardiac concerns or symptoms  Goals Unmet:  Not Applicable  Goals Comments:    Dr. Emily Filbert is Medical Director for [place] and [facility].

## 2015-01-21 ENCOUNTER — Encounter: Payer: Medicare Other | Admitting: *Deleted

## 2015-01-21 DIAGNOSIS — I208 Other forms of angina pectoris: Secondary | ICD-10-CM | POA: Diagnosis not present

## 2015-01-21 NOTE — Progress Notes (Signed)
Daily Session Note  Patient Details  Name: Cassidy Hernandez MRN: [id number] Date of Birth: 1941/03/14 Referring Provider:  Rusty Aus, MD  Encounter Date: 01/21/2015  Check In:     Session Check In - 01/21/15 1725    Check-In   Staff Present Nyoka Cowden RN;Carroll Enterkin RN, Drusilla Kanner MS, ACSM CEP Exercise Physiologist   ER physicians immediately available to respond to emergencies See telemetry face sheet for immediately available ER MD   Medication changes reported     No   Fall or balance concerns reported    No   Warm-up and Cool-down Performed on first and last piece of equipment   VAD Patient? No   Pain Assessment   Currently in Pain? No/denies   Multiple Pain Sites No           Exercise Prescription Changes - 01/21/15 1700    Exercise Review   Progression Yes   Resistance Training   Training Prescription Yes   Weight 2   Reps 10-15   Treadmill   MPH 2.5   Grade 0   NuStep   Level 4   Watts 60   Minutes 20      Goals Met:  Independence with exercise equipment Exercise tolerated well Personal goals reviewed No report of cardiac concerns or symptoms Strength training completed today  Goals Unmet:  Not Applicable  Goals Comments:   Dr. Emily Filbert is Medical Director for [place] and [facility].

## 2015-01-22 DIAGNOSIS — I208 Other forms of angina pectoris: Secondary | ICD-10-CM | POA: Diagnosis not present

## 2015-01-22 NOTE — Progress Notes (Signed)
Daily Session Note  Patient Details  Name: Cassidy Hernandez MRN: [id number] Date of Birth: [birth year] Referring Provider:  Rusty Aus, MD  Encounter Date: 01/22/2015  Check In:     Session Check In - 01/22/15 1641    Check-In   Staff Present Lestine Box BS, ACSM EP-C, Exercise Physiologist;Carroll Enterkin RN, BSN;Diane Joya Gaskins RN, BSN   ER physicians immediately available to respond to emergencies See telemetry face sheet for immediately available ER MD   Medication changes reported     No   Fall or balance concerns reported    No   Warm-up and Cool-down Performed on first and last piece of equipment   VAD Patient? No   Pain Assessment   Currently in Pain? No/denies           Exercise Prescription Changes - 01/21/15 1700    Exercise Review   Progression Yes   Resistance Training   Training Prescription Yes   Weight 2   Reps 10-15   Treadmill   MPH 2.5   Grade 0   NuStep   Level 4   Watts 60   Minutes 20      Goals Met:  Proper associated with RPD/PD & O2 Sat Exercise tolerated well No report of cardiac concerns or symptoms Strength training completed today  Goals Unmet:  Not Applicable  Goals Comments:    Dr. Emily Filbert is Medical Director for [place] and [facility].

## 2015-01-28 ENCOUNTER — Encounter: Payer: Self-pay | Admitting: *Deleted

## 2015-01-28 DIAGNOSIS — I208 Other forms of angina pectoris: Secondary | ICD-10-CM

## 2015-01-28 NOTE — Progress Notes (Signed)
Cardiac Individual Treatment Plan  Patient Details  Name: Cassidy Hernandez MRN: [id number] Date of Birth: October 08, 1940 Referring Provider:  Dr. Emily Filbert Initial Encounter Date:  12/29/2014  Visit Diagnosis: Stable angina  Patient's Home Medications on Admission:  Current outpatient prescriptions:  .  amLODipine (NORVASC) 5 MG tablet, Take by mouth., Disp: , Rfl:  .  aspirin EC 81 MG tablet, Take by mouth., Disp: , Rfl:  .  atenolol (TENORMIN) 50 MG tablet, Take by mouth., Disp: , Rfl:  .  chlorthalidone (HYGROTON) 25 MG tablet, TAKE 1/2 TABLET EVERY DAY, Disp: , Rfl:  .  losartan (COZAAR) 100 MG tablet, Take by mouth., Disp: , Rfl:  .  Multiple Vitamin (MULTIVITAMIN) capsule, Take by mouth., Disp: , Rfl:  .  nitroGLYCERIN (NITROSTAT) 0.4 MG SL tablet, Place under the tongue., Disp: , Rfl:  .  omeprazole (PRILOSEC) 20 MG capsule, Take by mouth., Disp: , Rfl:  .  rosuvastatin (CRESTOR) 20 MG tablet, Take by mouth., Disp: , Rfl:  .  zolpidem (AMBIEN) 10 MG tablet, Take by mouth., Disp: , Rfl:   Past Medical History: Past Medical History  Diagnosis Date  . Hypertension   . Kidney stone   . Subclavian steal syndrome   . 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 Degenerative arthritis   . GERD (gastroesophageal reflux disease)   . H/O: hysterectomy     Tobacco Use: History  Smoking status  . Never Smoker   Smokeless tobacco  . Not on file    Labs: Recent Review Flowsheet Data    There is no flowsheet data to display.       Exercise Target Goals:    Exercise Program Goal: Individual exercise prescription set with THRR, safety & activity barriers. Participant demonstrates ability to understand and report RPE using BORG scale, to self-measure pulse accurately, and to acknowledge the importance of the exercise prescription.  Exercise Prescription Goal: Starting with aerobic activity 30 plus minutes a day, 3 days per week for initial exercise prescription. Provide  home exercise prescription and guidelines that participant acknowledges understanding prior to discharge.  Activity Barriers & Risk Stratification:     Activity Barriers & Risk Stratification - 12/29/14 1422    Activity Barriers & Risk Stratification   Activity Barriers None   Risk Stratification High      6 Minute Walk:     6 Minute Walk      12/29/14 1510       6 Minute Walk   Phase Initial     Distance 1180 feet     Walk Time 6 minutes     Resting HR 65 bpm     Resting BP 142/78 mmHg     Max Ex. HR 95 bpm     Max Ex. BP 158/68 mmHg     RPE 11     Symptoms No        Initial Exercise Prescription:     Initial Exercise Prescription - 12/29/14 1500    Date of Initial Exercise Prescription   Date 12/29/14   Treadmill   MPH 2.2   Grade 0   Minutes 10   Bike   Level 0.4   Watts 12   Minutes 10   Recumbant Bike   Level 3   RPM 40   Watts 25   Minutes 15   NuStep   Level 3   Watts 40   Minutes 15   Arm Ergometer   Level 1  Watts 8   Minutes 10   Arm/Foot Ergometer   Level 4   Watts 12   Minutes 10   Cybex   Level 2   RPM 50   Minutes 10   Recumbant Elliptical   Level 1   Watts 10   Minutes 10   Elliptical   Level 1   Speed 3   Minutes 1   REL-XR   Level 2   Watts 30   Minutes 15   Prescription Details   Frequency (times per week) 3   Duration Progress to 30 minutes of continuous aerobic without signs/symptoms of physical distress   Intensity   THRR REST +  --  10-15 contractions below ischemic threshold    Ratings of Perceived Exertion 11-15   Progression Continue progressive overload as per policy without signs/symptoms or physical distress.   Resistance Training   Training Prescription Yes   Weight 2   Reps 10-12      Exercise Prescription Changes:     Exercise Prescription Changes      01/07/15 1600 01/14/15 1600 01/21/15 1700 01/27/15 1600     Exercise Review   Progression  Yes Yes Yes    Response to Exercise    Blood Pressure (Admit)    136/72 mmHg    Blood Pressure (Exercise)    144/78 mmHg    Blood Pressure (Exit)    158/70 mmHg    Heart Rate (Admit)    74 bpm    Heart Rate (Exercise)    97 bpm    Heart Rate (Exit)    66 bpm    Rating of Perceived Exertion (Exercise)    11    Progression    Continue progressive overload as per policy without signs/symptoms or physical distress.    Resistance Training   Training Prescription   Yes Yes    Weight   2 2    Reps   10-15 10-15    Treadmill   MPH  2.5 2.5 2.5    Grade  0 0 0    NuStep   Level '3 3 4 4    ' Watts 45 45 60 60    Minutes '20 20 20 20       ' Discharge Exercise Prescription (Final Exercise Prescription Changes):     Exercise Prescription Changes - 01/27/15 1600    Exercise Review   Progression Yes   Response to Exercise   Blood Pressure (Admit) 136/72 mmHg   Blood Pressure (Exercise) 144/78 mmHg   Blood Pressure (Exit) 158/70 mmHg   Heart Rate (Admit) 74 bpm   Heart Rate (Exercise) 97 bpm   Heart Rate (Exit) 66 bpm   Rating of Perceived Exertion (Exercise) 11   Progression Continue progressive overload as per policy without signs/symptoms or physical distress.   Resistance Training   Training Prescription Yes   Weight 2   Reps 10-15   Treadmill   MPH 2.5   Grade 0   NuStep   Level 4   Watts 60   Minutes 20      Nutrition:  Target Goals: Understanding of nutrition guidelines, daily intake of sodium <1570m, cholesterol <2041m calories 30% from fat and 7% or less from saturated fats, daily to have 5 or more servings of fruits and vegetables.  Biometrics:     Pre Biometrics - 12/29/14 1511    Pre Biometrics   Height 5' 1.1" (1.552 m)   Weight 182 lb 4.8 oz (82.691 kg)  Waist Circumference 40.25 inches   Hip Circumference 45.5 inches   Waist to Hip Ratio 0.88 %   BMI (Calculated) 34.4       Nutrition Therapy Plan and Nutrition Goals:     Nutrition Therapy & Goals - 01/21/15 1609    Nutrition Therapy    Diet DASH diet guidelines   Fruits and Vegetables 5 servings/day   Personal Nutrition Goals   Personal Goal #1 Try a sugar-free mix-in flavoring for water, or Propel flavored water to make sure you take in enough fluid.   Personal Goal #2 Choose fruit or carrots for at least one snack per day.   Personal Goal #3 Try yogurt and frozen fruit in place of ice cream, or sunchips or unsalted pretzels in place of potato chips.   Comments Patient reports some difficulty with stress eating and eats out frequently since husband's death 2 years ago.      Nutrition Discharge: Rate Your Plate Scores:     Rate Your Plate - 37/16/96 7893    Rate Your Plate Scores   Pre Score 64   Pre Score % 71 %      Nutrition Goals Re-Evaluation:     Nutrition Goals Re-Evaluation      01/15/15 1719           Personal Goal #1 Re-Evaluation   Personal Goal #1 Appt made with dietician. Zarriah said she goes out to eat since her husband died 2 years ago. Eats out alot with her friend. She said she used to be "very small".        Goal Progress Seen Yes          Psychosocial: Target Goals: Acknowledge presence or absence of depression, maximize coping skills, provide positive support system. Participant is able to verbalize types and ability to use techniques and skills needed for reducing stress and depression.  Initial Review & Psychosocial Screening:     Initial Psych Review & Screening - 12/29/14 [place]? Yes   Barriers   Psychosocial barriers to participate in program There are no identifiable barriers or psychosocial needs.   Screening Interventions   Interventions Encouraged to exercise      Quality of Life Scores:     Quality of Life - 12/29/14 1502    Quality of Life Scores   Health/Function Pre 26.57 %   Socioeconomic Pre 28.21 %   Psych/Spiritual Pre 29.14 %   Family Pre 30 %   GLOBAL Pre 27.92 %      PHQ-9:     Recent Review Flowsheet  Data    Depression screen [facility] 2/9 12/29/2014   Decreased Interest 0   Down, Depressed, Hopeless 0   PHQ - 2 Score 0   Altered sleeping 0   Tired, decreased energy 1   Change in appetite 2   Feeling bad or failure about yourself  0   Trouble concentrating 0   Moving slowly or fidgety/restless 0   Suicidal thoughts 0   PHQ-9 Score 3   Difficult doing work/chores Not difficult at all      Psychosocial Evaluation and Intervention:     Psychosocial Evaluation - 01/21/15 1703    Psychosocial Evaluation & Interventions   Interventions Stress management education;Relaxation education;Encouraged to exercise with the program and follow exercise prescription   Comments Counselor met with Ms. Ludolph today for initial psychosocial evaluation.  She is a 74 year old who has a  strong support system and is actively involved in her faith community.  She denies a history of depression or current symptoms and states that her mood is generally positive most of the time.  Ms. Learn states that her appetite is "too good" currently and has met with the dietician to begin working on changing this and losing some weight as one of her goals for the program.  She also reports that she has not been sleeping well the past two weeks with approx. 4 hours per night.  Counselor recommended speaking with Dr. or pharmacist about a natural OTC sleep aid that is sustained release since she reports no problem getting to sleep, only staying asleep.  Counselor will follow up with Ms. Manahan about this in the near future.     Continued Psychosocial Services Needed Yes  Ms. Crittendon will benefit from the psychoeducational components of this program, consistent exercise and improved sleep, which is being addressed.       Psychosocial Re-Evaluation:   Vocational Rehabilitation: Provide vocational rehab assistance to qualifying candidates.   Vocational Rehab Evaluation & Intervention:     Vocational Rehab - 12/29/14 1423     Initial Vocational Rehab Evaluation & Intervention   Assessment shows need for Vocational Rehabilitation No      Education: Education Goals: Education classes will be provided on a weekly basis, covering required topics. Participant will state understanding/return demonstration of topics presented.  Learning Barriers/Preferences:     Learning Barriers/Preferences - 12/29/14 1423    Learning Barriers/Preferences   Learning Barriers None;Reading      Education Topics: General Nutrition Guidelines/Fats and Fiber: -Group instruction provided by verbal, written material, models and posters to present the general guidelines for heart healthy nutrition. Gives an explanation and review of dietary fats and fiber.   Controlling Sodium/Reading Food Labels: -Group verbal and written material supporting the discussion of sodium use in heart healthy nutrition. Review and explanation with models, verbal and written materials for utilization of the food label.   Exercise Physiology & Risk Factors: - Group verbal and written instruction with models to review the exercise physiology of the cardiovascular system and associated critical values. Details cardiovascular disease risk factors and the goals associated with each risk factor.   Aerobic Exercise & Resistance Training: - Gives group verbal and written discussion on the health impact of inactivity. On the components of aerobic and resistive training programs and the benefits of this training and how to safely progress through these programs.   Flexibility, Balance, General Exercise Guidelines: - Provides group verbal and written instruction on the benefits of flexibility and balance training programs. Provides general exercise guidelines with specific guidelines to those with heart or lung disease. Demonstration and skill practice provided.   Stress Management: - Provides group verbal and written instruction about the health risks of  elevated stress, cause of high stress, and healthy ways to reduce stress.   Depression: - Provides group verbal and written instruction on the correlation between heart/lung disease and depressed mood, treatment options, and the stigmas associated with seeking treatment.   Anatomy & Physiology of the Heart: - Group verbal and written instruction and models provide basic cardiac anatomy and physiology, with the coronary electrical and arterial systems. Review of: AMI, Angina, Valve disease, Heart Failure, Cardiac Arrhythmia, Pacemakers, and the ICD.   Cardiac Procedures: - Group verbal and written instruction and models to describe the testing methods done to diagnose heart disease. Reviews the outcomes of the test results. Describes the  treatment choices: Medical Management, Angioplasty, or Coronary Bypass Surgery.   Cardiac Medications: - Group verbal and written instruction to review commonly prescribed medications for heart disease. Reviews the medication, class of the drug, and side effects. Includes the steps to properly store meds and maintain the prescription regimen.   Go Sex-Intimacy & Heart Disease, Get SMART - Goal Setting: - Group verbal and written instruction through game format to discuss heart disease and the return to sexual intimacy. Provides group verbal and written material to discuss and apply goal setting through the application of the S.M.A.R.T. Method.   Other Matters of the Heart: - Provides group verbal, written materials and models to describe Heart Failure, Angina, Valve Disease, and Diabetes in the realm of heart disease. Includes description of the disease process and treatment options available to the cardiac patient.   Exercise & Equipment Safety: - Individual verbal instruction and demonstration of equipment use and safety with use of the equipment.   Infection Prevention: - Provides verbal and written material to individual with discussion of  infection control including proper hand washing and proper equipment cleaning during exercise session.   Falls Prevention: - Provides verbal and written material to individual with discussion of falls prevention and safety.   Diabetes: - Individual verbal and written instruction to review signs/symptoms of diabetes, desired ranges of glucose level fasting, after meals and with exercise. Advice that pre and post exercise glucose checks will be done for 3 sessions at entry of program.    Knowledge Questionnaire Score:     Knowledge Questionnaire Score - 12/29/14 1504    Knowledge Questionnaire Score   Pre Score 27      Personal Goals and Risk Factors at Admission:     Personal Goals and Risk Factors at Admission - 12/29/14 1445    Personal Goals and Risk Factors on Admission    Weight Management Yes   Intervention Learn and follow the exercise and diet guidelines while in the program. Utilize the nutrition and education classes to help gain knowledge of the diet and exercise expectations in the program   Diabetes No   Hypertension Yes   Goal Participant will see blood pressure controlled within the values of 140/85m/Hg or within value directed by their physician.   Intervention Provide nutrition & aerobic exercise along with prescribed medications to achieve BP 140/90 or less.   Lipids Yes   Goal Cholesterol controlled with medications as prescribed, with individualized exercise RX and with personalized nutrition plan. Value goals: LDL < 716m HDL > 4055mParticipant states understanding of desired cholesterol values and following prescriptions.   Intervention Provide nutrition & aerobic exercise along with prescribed medications to achieve LDL <46m27mDL >40mg42m   Personal Goals and Risk Factors Review:      Goals and Risk Factor Review      01/15/15 1720           Increase Aerobic Exercise and Physical Activity   Goals Progress/Improvement seen  Yes           Personal Goals Discharge:     Comments: 30 day review. Continue with ITP.

## 2015-02-11 ENCOUNTER — Encounter: Payer: Medicare Other | Attending: Internal Medicine | Admitting: *Deleted

## 2015-02-11 DIAGNOSIS — I208 Other forms of angina pectoris: Secondary | ICD-10-CM | POA: Diagnosis present

## 2015-02-11 NOTE — Progress Notes (Signed)
Daily Session Note  Patient Details  Name: Cassidy Hernandez MRN: [id number] Date of Birth: Jan 03, 1941 Referring Provider:  Rusty Aus, MD  Encounter Date: 02/11/2015  Check In:     Session Check In - 02/11/15 1728    Check-In   Staff Present Heath Lark RN, BSN, CCRP;Amaiyah Nordhoff RN, Drusilla Kanner MS, ACSM CEP Exercise Physiologist   ER physicians immediately available to respond to emergencies See telemetry face sheet for immediately available ER MD   Medication changes reported     No   Fall or balance concerns reported    No   Warm-up and Cool-down Performed on first and last piece of equipment   VAD Patient? No   Pain Assessment   Currently in Pain? No/denies         Goals Met:  Proper associated with RPD/PD & O2 Sat Exercise tolerated well  Goals Unmet:  Not Applicable  Goals Comments:    Dr. Emily Filbert is Medical Director for [place] and [facility].

## 2015-02-12 DIAGNOSIS — I208 Other forms of angina pectoris: Secondary | ICD-10-CM

## 2015-02-12 NOTE — Progress Notes (Signed)
Daily Session Note  Patient Details  Name: Cassidy Hernandez MRN: [id number] Date of Birth: [birth year] Referring Provider:  Rusty Aus, MD  Encounter Date: 02/12/2015  Check In:     Session Check In - 02/11/15 1728    Check-In   Staff Present Heath Lark RN, BSN, CCRP;Carroll Enterkin RN, Drusilla Kanner MS, ACSM CEP Exercise Physiologist   ER physicians immediately available to respond to emergencies See telemetry face sheet for immediately available ER MD   Medication changes reported     No   Fall or balance concerns reported    No   Warm-up and Cool-down Performed on first and last piece of equipment   VAD Patient? No   Pain Assessment   Currently in Pain? No/denies         Goals Met:  Proper associated with RPD/PD & O2 Sat Exercise tolerated well No report of cardiac concerns or symptoms Strength training completed today  Goals Unmet:  Not Applicable  Goals Comments:    Dr. Emily Filbert is Medical Director for [place] and [facility].

## 2015-02-16 DIAGNOSIS — I208 Other forms of angina pectoris: Secondary | ICD-10-CM

## 2015-02-16 NOTE — Progress Notes (Signed)
Daily Session Note  Patient Details  Name: SORAYAH SCHRODT MRN: [id number] Date of Birth: 1941-08-03 Referring Provider:  Rusty Aus, MD  Encounter Date: 02/16/2015  Check In:     Session Check In - 02/16/15 1609    Check-In   Staff Present Heath Lark RN, BSN, CCRP;Calyse Murcia BS, ACSM EP-C, Exercise Physiologist;Other   ER physicians immediately available to respond to emergencies See telemetry face sheet for immediately available ER MD   Medication changes reported     No   Fall or balance concerns reported    No   Warm-up and Cool-down Performed on first and last piece of equipment   VAD Patient? No   Pain Assessment   Currently in Pain? No/denies         Goals Met:  Proper associated with RPD/PD & O2 Sat Exercise tolerated well No report of cardiac concerns or symptoms Strength training completed today  Goals Unmet:  Not Applicable  Goals Comments:    Dr. Emily Filbert is Medical Director for [place] [facility].

## 2015-02-17 ENCOUNTER — Encounter: Payer: Self-pay | Admitting: *Deleted

## 2015-02-18 ENCOUNTER — Encounter: Payer: Medicare Other | Admitting: *Deleted

## 2015-02-18 DIAGNOSIS — I208 Other forms of angina pectoris: Secondary | ICD-10-CM | POA: Diagnosis not present

## 2015-02-18 NOTE — Progress Notes (Signed)
Daily Session Note  Patient Details  Name: Cassidy Hernandez MRN: [id number] Date of Birth: 1941-04-13 Referring Provider:  Rusty Aus, MD  Encounter Date: 02/18/2015  Check In:     Session Check In - 02/18/15 1613    Check-In   Staff Present Gerlene Burdock RN, BSN;Renee Dillard [place] MS, ACSM CEP Exercise Physiologist;Llewelyn Sheaffer Mariana Arn, BSN   ER physicians immediately available to respond to emergencies See telemetry face sheet for immediately available ER MD   Medication changes reported     No   Fall or balance concerns reported    No   Warm-up and Cool-down Performed on first and last piece of equipment   VAD Patient? No   Pain Assessment   Currently in Pain? No/denies   Multiple Pain Sites No         Goals Met:  Exercise tolerated well No report of cardiac concerns or symptoms Strength training completed today  Goals Unmet:  Not Applicable  Goals Comments:    Dr. Emily Filbert is Medical Director for [place] [facility].

## 2015-02-19 DIAGNOSIS — I208 Other forms of angina pectoris: Secondary | ICD-10-CM

## 2015-02-19 NOTE — Progress Notes (Signed)
Daily Session Note  Patient Details  Name: FRANKLIN CLAPSADDLE MRN: [id number] Date of Birth: 1940-11-06 Referring Provider:  Rusty Aus, MD  Encounter Date: 02/19/2015  Check In:     Session Check In - 02/19/15 1643    Check-In   Staff Present Lestine Box BS, ACSM EP-C, Exercise Physiologist;Carroll Enterkin RN, BSN;Diane Joya Gaskins RN, BSN   ER physicians immediately available to respond to emergencies See telemetry face sheet for immediately available ER MD   Medication changes reported     No   Fall or balance concerns reported    No   Warm-up and Cool-down Performed on first and last piece of equipment   VAD Patient? No   Pain Assessment   Currently in Pain? No/denies         Goals Met:  Proper associated with RPD/PD & O2 Sat Exercise tolerated well No report of cardiac concerns or symptoms Strength training completed today  Goals Unmet:  Not Applicable  Goals Comments:    Dr. Emily Filbert is Medical Director for [place] [facility].

## 2015-02-23 DIAGNOSIS — I208 Other forms of angina pectoris: Secondary | ICD-10-CM

## 2015-02-23 NOTE — Progress Notes (Signed)
Daily Session Note  Patient Details  Name: HANSIKA LEAMING MRN: [id number] Date of Birth: [birth year] Referring Provider:  Rusty Aus, MD  Encounter Date: 02/23/2015  Check In:     Session Check In - 02/23/15 [place]    Check-In   Staff Present Nyoka Cowden RN;Susanne Bice RN, BSN, CCRP;Steven Way BS, ACSM EP-C, Exercise Physiologist   Medication changes reported     No   Fall or balance concerns reported    No   Warm-up and Cool-down Performed on first and last piece of equipment   VAD Patient? No   Pain Assessment   Currently in Pain? No/denies         Goals Met:  Independence with exercise equipment Exercise tolerated well No report of cardiac concerns or symptoms  Goals Unmet:  Not Applicable  Goals Comments: Progressing well with her exercise prescription.    Dr. Emily Filbert is Medical Director for [place] and [facility].

## 2015-02-24 ENCOUNTER — Encounter: Payer: Self-pay | Admitting: *Deleted

## 2015-02-24 DIAGNOSIS — I208 Other forms of angina pectoris: Secondary | ICD-10-CM

## 2015-02-24 NOTE — Progress Notes (Signed)
Cardiac Individual Treatment Plan  Patient Details  Name: Cassidy Hernandez MRN: [id number] Date of Birth: 1940/12/21 Referring Provider: Dr. Nicholaus Bloom Initial Encounter Date:  12/29/2014  Visit Diagnosis: Stable angina  Patient's Home Medications on Admission:  Current outpatient prescriptions:  .  amLODipine (NORVASC) 5 MG tablet, Take by mouth., Disp: , Rfl:  .  aspirin EC 81 MG tablet, Take by mouth., Disp: , Rfl:  .  atenolol (TENORMIN) 50 MG tablet, Take by mouth., Disp: , Rfl:  .  chlorthalidone (HYGROTON) 25 MG tablet, TAKE 1/2 TABLET EVERY DAY, Disp: , Rfl:  .  losartan (COZAAR) 100 MG tablet, Take by mouth., Disp: , Rfl:  .  Multiple Vitamin (MULTIVITAMIN) capsule, Take by mouth., Disp: , Rfl:  .  nitroGLYCERIN (NITROSTAT) 0.4 MG SL tablet, Place under the tongue., Disp: , Rfl:  .  omeprazole (PRILOSEC) 20 MG capsule, Take by mouth., Disp: , Rfl:  .  rosuvastatin (CRESTOR) 20 MG tablet, Take by mouth., Disp: , Rfl:  .  zolpidem (AMBIEN) 10 MG tablet, Take by mouth., Disp: , Rfl:   Past Medical History: Past Medical History  Diagnosis Date  . Hypertension   . Kidney stone   . Subclavian steal syndrome   . 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 Degenerative arthritis   . GERD (gastroesophageal reflux disease)   . H/O: hysterectomy     Tobacco Use: History  Smoking status  . Never Smoker   Smokeless tobacco  . Not on file    Labs: Recent Review Flowsheet Data    There is no flowsheet data to display.       Exercise Target Goals:    Exercise Program Goal: Individual exercise prescription set with THRR, safety & activity barriers. Participant demonstrates ability to understand and report RPE using BORG scale, to self-measure pulse accurately, and to acknowledge the importance of the exercise prescription.  Exercise Prescription Goal: Starting with aerobic activity 30 plus minutes a day, 3 days per week for initial exercise prescription. Provide home  exercise prescription and guidelines that participant acknowledges understanding prior to discharge.  Activity Barriers & Risk Stratification:     Activity Barriers & Risk Stratification - 12/29/14 1422    Activity Barriers & Risk Stratification   Activity Barriers None   Risk Stratification High      6 Minute Walk:     6 Minute Walk      12/29/14 1510       6 Minute Walk   Phase Initial     Distance 1180 feet     Walk Time 6 minutes     Resting HR 65 bpm     Resting BP 142/78 mmHg     Max Ex. HR 95 bpm     Max Ex. BP 158/68 mmHg     RPE 11     Symptoms No        Initial Exercise Prescription:     Initial Exercise Prescription - 12/29/14 1500    Date of Initial Exercise Prescription   Date 12/29/14   Treadmill   MPH 2.2   Grade 0   Minutes 10   Bike   Level 0.4   Watts 12   Minutes 10   Recumbant Bike   Level 3   RPM 40   Watts 25   Minutes 15   NuStep   Level 3   Watts 40   Minutes 15   Arm Ergometer   Level 1  Watts 8   Minutes 10   Arm/Foot Ergometer   Level 4   Watts 12   Minutes 10   Cybex   Level 2   RPM 50   Minutes 10   Recumbant Elliptical   Level 1   Watts 10   Minutes 10   Elliptical   Level 1   Speed 3   Minutes 1   REL-XR   Level 2   Watts 30   Minutes 15   Prescription Details   Frequency (times per week) 3   Duration Progress to 30 minutes of continuous aerobic without signs/symptoms of physical distress   Intensity   THRR REST +  --  10-15 contractions below ischemic threshold    Ratings of Perceived Exertion 11-15   Progression Continue progressive overload as per policy without signs/symptoms or physical distress.   Resistance Training   Training Prescription Yes   Weight 2   Reps 10-12      Exercise Prescription Changes:     Exercise Prescription Changes      01/07/15 1600 01/14/15 1600 01/21/15 1700 01/27/15 1600 02/17/15 0700   Exercise Review   Progression  Yes Yes Yes Yes   Response to  Exercise   Blood Pressure (Admit)    136/72 mmHg 122/72 mmHg   Blood Pressure (Exercise)    144/78 mmHg 142/64 mmHg   Blood Pressure (Exit)    158/70 mmHg 130/60 mmHg   Heart Rate (Admit)    74 bpm 61 bpm   Heart Rate (Exercise)    97 bpm 82 bpm   Heart Rate (Exit)    66 bpm 62 bpm   Rating of Perceived Exertion (Exercise)    11 13   Symptoms     No   Frequency     Add 1 additional day to program exercise sessions.   Duration     Progress to 30 minutes of continuous aerobic without signs/symptoms of physical distress   Intensity     Other (comment)  REST + 20; 10-15 beats below ischemic threshold   Progression    Continue progressive overload as per policy without signs/symptoms or physical distress. Continue progressive overload as per policy without signs/symptoms or physical distress.   Resistance Training   Training Prescription   Yes Yes Yes   Weight   _0 Reps   10-15 10-15 10-15   Interval Training   Interval Training     No   Treadmill   MPH  2.5 2.5 2.5 2.5   Grade  0 0 0 0   NuStep   Level _1 Watts 45 45 60 60 60   Minutes _2 Discharge Exercise Prescription (Final Exercise Prescription Changes):     Exercise Prescription Changes - 02/17/15 0700    Exercise Review   Progression Yes   Response to Exercise   Blood Pressure (Admit) 122/72 mmHg   Blood Pressure (Exercise) 142/64 mmHg   Blood Pressure (Exit) 130/60 mmHg   Heart Rate (Admit) 61 bpm   Heart Rate (Exercise) 82 bpm   Heart Rate (Exit) 62 bpm   Rating of Perceived Exertion (Exercise) 13   Symptoms No   Frequency Add 1 additional day to program exercise sessions.   Duration Progress to 30 minutes of continuous aerobic without signs/symptoms of physical distress   Intensity Other (comment)  REST + 20; 10-15 beats below  ischemic threshold   Progression Continue progressive overload as per policy without signs/symptoms or physical distress.   Resistance Training   Training  Prescription Yes   Weight 2   Reps 10-15   Interval Training   Interval Training No   Treadmill   MPH 2.5   Grade 0   NuStep   Level 4   Watts 60   Minutes 20      Nutrition:  Target Goals: Understanding of nutrition guidelines, daily intake of sodium <1545m, cholesterol <2021m calories 30% from fat and 7% or less from saturated fats, daily to have 5 or more servings of fruits and vegetables.  Biometrics:     Pre Biometrics - 12/29/14 1511    Pre Biometrics   Height 5' 1.1" (1.552 m)   Weight 182 lb 4.8 oz (82.691 kg)   Waist Circumference 40.25 inches   Hip Circumference 45.5 inches   Waist to Hip Ratio 0.88 %   BMI (Calculated) 34.4       Nutrition Therapy Plan and Nutrition Goals:     Nutrition Therapy & Goals - 01/21/15 1609    Nutrition Therapy   Diet DASH diet guidelines   Fruits and Vegetables 5 servings/day   Personal Nutrition Goals   Personal Goal #1 Try a sugar-free mix-in flavoring for water, or Propel flavored water to make sure you take in enough fluid.   Personal Goal #2 Choose fruit or carrots for at least one snack per day.   Personal Goal #3 Try yogurt and frozen fruit in place of ice cream, or sunchips or unsalted pretzels in place of potato chips.   Comments Patient reports some difficulty with stress eating and eats out frequently since husband's death 2 years ago.      Nutrition Discharge: Rate Your Plate Scores:     Rate Your Plate - 0615/52/0860223  Rate Your Plate Scores   Pre Score 64   Pre Score % 71 %      Nutrition Goals Re-Evaluation:     Nutrition Goals Re-Evaluation      01/15/15 1719 02/11/15 1730         Personal Goal #1 Re-Evaluation   Personal Goal #1 Appt made with dietician. LeTequitaaid she goes out to eat since her husband died 2 years ago. Eats out alot with her friend. She said she used to be "very small".  Has been out of town lately. Hard to eat healthy then.      Goal Progress Seen Yes No          Psychosocial: Target Goals: Acknowledge presence or absence of depression, maximize coping skills, provide positive support system. Participant is able to verbalize types and ability to use techniques and skills needed for reducing stress and depression.  Initial Review & Psychosocial Screening:     Initial Psych Review & Screening - 12/29/14 15[place]Yes   Barriers   Psychosocial barriers to participate in program There are no identifiable barriers or psychosocial needs.   Screening Interventions   Interventions Encouraged to exercise      Quality of Life Scores:     Quality of Life - 12/29/14 1502    Quality of Life Scores   Health/Function Pre 26.57 %   Socioeconomic Pre 28.21 %   Psych/Spiritual Pre 29.14 %   Family Pre 30 %   GLOBAL Pre 27.92 %      PHQ-9:  Recent Review Flowsheet Data    Depression screen [facility] 2/9 12/29/2014   Decreased Interest 0   Down, Depressed, Hopeless 0   PHQ - 2 Score 0   Altered sleeping 0   Tired, decreased energy 1   Change in appetite 2   Feeling bad or failure about yourself  0   Trouble concentrating 0   Moving slowly or fidgety/restless 0   Suicidal thoughts 0   PHQ-9 Score 3   Difficult doing work/chores Not difficult at all      Psychosocial Evaluation and Intervention:     Psychosocial Evaluation - 01/21/15 1703    Psychosocial Evaluation & Interventions   Interventions Stress management education;Relaxation education;Encouraged to exercise with the program and follow exercise prescription   Comments Counselor met with Cassidy Hernandez today for initial psychosocial evaluation.  She is a 74 year old who has a strong support system and is actively involved in her faith community.  She denies a history of depression or current symptoms and states that her mood is generally positive most of the time.  Cassidy Hernandez states that her appetite is "too good" currently and has met with the  dietician to begin working on changing this and losing some weight as one of her goals for the program.  She also reports that she has not been sleeping well the past two weeks with approx. 4 hours per night.  Counselor recommended speaking with Dr. or pharmacist about a natural OTC sleep aid that is sustained release since she reports no problem getting to sleep, only staying asleep.  Counselor will follow up with Cassidy Hernandez about this in the near future.     Continued Psychosocial Services Needed Yes  Cassidy Hernandez will benefit from the psychoeducational components of this program, consistent exercise and improved sleep, which is being addressed.       Psychosocial Re-Evaluation:   Vocational Rehabilitation: Provide vocational rehab assistance to qualifying candidates.   Vocational Rehab Evaluation & Intervention:     Vocational Rehab - 12/29/14 1423    Initial Vocational Rehab Evaluation & Intervention   Assessment shows need for Vocational Rehabilitation No      Education: Education Goals: Education classes will be provided on a weekly basis, covering required topics. Participant will state understanding/return demonstration of topics presented.  Learning Barriers/Preferences:     Learning Barriers/Preferences - 12/29/14 1423    Learning Barriers/Preferences   Learning Barriers None;Reading      Education Topics: General Nutrition Guidelines/Fats and Fiber: -Group instruction provided by verbal, written material, models and posters to present the general guidelines for heart healthy nutrition. Gives an explanation and review of dietary fats and fiber.   Controlling Sodium/Reading Food Labels: -Group verbal and written material supporting the discussion of sodium use in heart healthy nutrition. Review and explanation with models, verbal and written materials for utilization of the food label.   Exercise Physiology & Risk Factors: - Group verbal and written instruction with  models to review the exercise physiology of the cardiovascular system and associated critical values. Details cardiovascular disease risk factors and the goals associated with each risk factor.          Cardiac Rehab from 02/23/2015 in [facility] Cardiac Rehab   Date  02/18/15   Educator  R.M.    Instruction Review Code  2- meets goals/outcomes      Aerobic Exercise & Resistance Training: - Gives group verbal and written discussion on the health impact of inactivity. On the components  of aerobic and resistive training programs and the benefits of this training and how to safely progress through these programs.      Cardiac Rehab from 02/23/2015 in [facility] Cardiac Rehab   Date  02/23/15   Educator  SW   Instruction Review Code  2- meets goals/outcomes      Flexibility, Balance, General Exercise Guidelines: - Provides group verbal and written instruction on the benefits of flexibility and balance training programs. Provides general exercise guidelines with specific guidelines to those with heart or lung disease. Demonstration and skill practice provided.   Stress Management: - Provides group verbal and written instruction about the health risks of elevated stress, cause of high stress, and healthy ways to reduce stress.      Cardiac Rehab from 02/16/2015 in [facility] Cardiac Rehab   Date  01/07/15   Educator  [facility]   Instruction Review Code  2- meets goals/outcomes      Depression: - Provides group verbal and written instruction on the correlation between heart/lung disease and depressed mood, treatment options, and the stigmas associated with seeking treatment.   Anatomy & Physiology of the Heart: - Group verbal and written instruction and models provide basic cardiac anatomy and physiology, with the coronary electrical and arterial systems. Review of: AMI, Angina, Valve disease, Heart Failure, Cardiac Arrhythmia, Pacemakers, and the ICD.      Cardiac Rehab from 02/16/2015 in [facility] Cardiac Rehab    Date  01/12/15   Educator  SB   Instruction Review Code  2- meets goals/outcomes      Cardiac Procedures: - Group verbal and written instruction and models to describe the testing methods done to diagnose heart disease. Reviews the outcomes of the test results. Describes the treatment choices: Medical Management, Angioplasty, or Coronary Bypass Surgery.      Cardiac Rehab from 02/16/2015 in [facility] Cardiac Rehab   Date  02/11/15   Educator  C. Enterkin, RN   Instruction Review Code  2- meets goals/outcomes      Cardiac Medications: - Group verbal and written instruction to review commonly prescribed medications for heart disease. Reviews the medication, class of the drug, and side effects. Includes the steps to properly store meds and maintain the prescription regimen.      Cardiac Rehab from 02/16/2015 in [facility] Cardiac Rehab   Date  02/16/15   Educator  SB   Instruction Review Code  2- meets goals/outcomes      Go Sex-Intimacy & Heart Disease, Get SMART - Goal Setting: - Group verbal and written instruction through game format to discuss heart disease and the return to sexual intimacy. Provides group verbal and written material to discuss and apply goal setting through the application of the S.M.A.R.T. Method.      Cardiac Rehab from 02/16/2015 in [facility] Cardiac Rehab   Date  02/11/15   Educator  C. Enterkin, RN   Instruction Review Code  2- meets goals/outcomes      Other Matters of the Heart: - Provides group verbal, written materials and models to describe Heart Failure, Angina, Valve Disease, and Diabetes in the realm of heart disease. Includes description of the disease process and treatment options available to the cardiac patient.      Cardiac Rehab from 02/16/2015 in [facility] Cardiac Rehab   Date  01/21/15   Educator  CE   Instruction Review Code  2- meets goals/outcomes      Exercise & Equipment Safety: - Individual verbal instruction and demonstration of equipment use  and  safety with use of the equipment.      Cardiac Rehab from 02/16/2015 in [facility] Cardiac Rehab   Date  12/29/14   Educator  C.EnterkinRN   Instruction Review Code  2- meets goals/outcomes      Infection Prevention: - Provides verbal and written material to individual with discussion of infection control including proper hand washing and proper equipment cleaning during exercise session.      Cardiac Rehab from 02/16/2015 in [facility] Cardiac Rehab   Date  12/29/14   Educator  C.EnterkinRN   Instruction Review Code  2- meets goals/outcomes      Falls Prevention: - Provides verbal and written material to individual with discussion of falls prevention and safety.      Cardiac Rehab from 02/16/2015 in [facility] Cardiac Rehab   Date  12/29/14   Educator  C. McNeil   Instruction Review Code  2- meets goals/outcomes      Diabetes: - Individual verbal and written instruction to review signs/symptoms of diabetes, desired ranges of glucose level fasting, after meals and with exercise. Advice that pre and post exercise glucose checks will be done for 3 sessions at entry of program.    Knowledge Questionnaire Score:     Knowledge Questionnaire Score - 12/29/14 1504    Knowledge Questionnaire Score   Pre Score 27      Personal Goals and Risk Factors at Admission:     Personal Goals and Risk Factors at Admission - 12/29/14 1445    Personal Goals and Risk Factors on Admission    Weight Management Yes   Intervention Learn and follow the exercise and diet guidelines while in the program. Utilize the nutrition and education classes to help gain knowledge of the diet and exercise expectations in the program   Diabetes No   Hypertension Yes   Goal Participant will see blood pressure controlled within the values of 140/3m/Hg or within value directed by their physician.   Intervention Provide nutrition & aerobic exercise along with prescribed medications to achieve BP 140/90 or less.   Lipids Yes    Goal Cholesterol controlled with medications as prescribed, with individualized exercise RX and with personalized nutrition plan. Value goals: LDL < 724m HDL > 4039mParticipant states understanding of desired cholesterol values and following prescriptions.   Intervention Provide nutrition & aerobic exercise along with prescribed medications to achieve LDL <77m35mDL >40mg41m   Personal Goals and Risk Factors Review:      Goals and Risk Factor Review      01/15/15 1720 02/11/15 1730         Increase Aerobic Exercise and Physical Activity   Goals Progress/Improvement seen  Yes Yes         Personal Goals Discharge:     Comments: 30 day review. Continue with ITP.

## 2015-02-25 ENCOUNTER — Encounter: Payer: Medicare Other | Admitting: *Deleted

## 2015-02-25 ENCOUNTER — Other Ambulatory Visit: Payer: Self-pay | Admitting: *Deleted

## 2015-02-25 DIAGNOSIS — I208 Other forms of angina pectoris: Secondary | ICD-10-CM | POA: Diagnosis not present

## 2015-02-25 NOTE — Progress Notes (Signed)
Daily Session Note  Patient Details  Name: SHAHIDAH NESBITT MRN: [id number] Date of Birth: 07-07-1941 Referring Provider:  Rusty Aus, MD  Encounter Date: 02/25/2015  Check In:     Session Check In - 02/25/15 1617    Check-In   Staff Present Nyoka Cowden RN;Renee Madison MS, ACSM CEP Exercise Physiologist;Lakshya Mcgillicuddy Mariana Arn, BSN   ER physicians immediately available to respond to emergencies See telemetry face sheet for immediately available ER MD   Medication changes reported     No   Fall or balance concerns reported    No   Warm-up and Cool-down Performed on first and last piece of equipment   VAD Patient? No   Pain Assessment   Currently in Pain? No/denies   Multiple Pain Sites No         Goals Met:  Independence with exercise equipment Exercise tolerated well No report of cardiac concerns or symptoms Strength training completed today Exercise goals reviewed with pt.  Weights increased to 3 pounds today.   Goals Unmet:  Not Applicable  Goals Comments:    Dr. Emily Filbert is Medical Director for [place] and [facility].

## 2015-02-26 DIAGNOSIS — I208 Other forms of angina pectoris: Secondary | ICD-10-CM | POA: Diagnosis not present

## 2015-02-26 NOTE — Progress Notes (Signed)
Daily Session Note  Patient Details  Name: Cassidy Hernandez MRN: [id number] Date of Birth: 1941-01-25 Referring Provider:  Rusty Aus, MD  Encounter Date: 02/26/2015  Check In:     Session Check In - 02/26/15 1600    Check-In   Staff Present Lestine Box BS, ACSM EP-C, Exercise Physiologist;Carroll Enterkin RN, BSN;Diane Joya Gaskins RN, BSN   ER physicians immediately available to respond to emergencies See telemetry face sheet for immediately available ER MD   Medication changes reported     No   Fall or balance concerns reported    No   Warm-up and Cool-down Performed on first and last piece of equipment   VAD Patient? No   Pain Assessment   Currently in Pain? No/denies         Goals Met:  Proper associated with RPD/PD & O2 Sat Exercise tolerated well No report of cardiac concerns or symptoms Strength training completed today  Goals Unmet:  Not Applicable  Goals Comments:    Dr. Emily Filbert is Medical Director for [place] and [facility].

## 2015-03-02 ENCOUNTER — Encounter: Payer: Medicare Other | Admitting: *Deleted

## 2015-03-02 DIAGNOSIS — I208 Other forms of angina pectoris: Secondary | ICD-10-CM

## 2015-03-02 NOTE — Progress Notes (Signed)
Daily Session Note  Patient Details  Name: Cassidy Hernandez MRN: [id number] Date of Birth: [birth year] Referring Provider:  Rusty Aus, MD  Encounter Date: 03/02/2015  Check In:     Session Check In - 03/02/15 1837    Check-In   Staff Present Gerlene Burdock RN, BSN;Graves Nipp Joya Gaskins RN, BSN;Other  Other; Hessie Knows, Exercise Specialist   ER physicians immediately available to respond to emergencies See telemetry face sheet for immediately available ER MD   Medication changes reported     No   Fall or balance concerns reported    No   Warm-up and Cool-down Performed on first and last piece of equipment   VAD Patient? No   VAD patient   Has back up controller? No           Exercise Prescription Changes - 03/02/15 1800    Exercise Review   Progression Yes   Response to Exercise   Intensity --  REST + 20; 10-15 beats below ischemic threshold   Resistance Training   Training Prescription Yes   Weight 3   Reps 10-15   Interval Training   Interval Training Yes   Equipment NuStep   Comments NuStep L 4-5/40-60W   Treadmill   MPH 2.5   Grade 0   NuStep   Level --   Watts --   Minutes --      Goals Met:  Exercise tolerated well No report of cardiac concerns or symptoms Strength training completed today  Exercise goals reviewed with patient.    Goals Unmet:  Not Applicable  Goals Comments:    Dr. Emily Filbert is Medical Director for [place] and [facility].

## 2015-03-04 ENCOUNTER — Encounter: Payer: Medicare Other | Admitting: *Deleted

## 2015-03-04 DIAGNOSIS — I208 Other forms of angina pectoris: Secondary | ICD-10-CM | POA: Diagnosis not present

## 2015-03-04 NOTE — Progress Notes (Signed)
Daily Session Note  Patient Details  Name: Cassidy Hernandez MRN: [id number] Date of Birth: [birth year] Referring Provider:  Rusty Aus, MD  Encounter Date: 03/04/2015  Check In:     Session Check In - 03/04/15 1613    Check-In   Staff Present Candiss Norse MS, ACSM CEP Exercise Physiologist;Susanne Bice RN, BSN, CCRP;Carroll Enterkin RN, BSN   ER physicians immediately available to respond to emergencies See telemetry face sheet for immediately available ER MD   Medication changes reported     No   Fall or balance concerns reported    No   Warm-up and Cool-down Performed on first and last piece of equipment   VAD Patient? No   VAD patient   Has back up controller? No   Pain Assessment   Currently in Pain? No/denies         Goals Met:  Independence with exercise equipment Exercise tolerated well Personal goals reviewed No report of cardiac concerns or symptoms Strength training completed today  Goals Unmet:  Not Applicable  Goals Comments:   Dr. Emily Filbert is Medical Director for [place] and [facility].

## 2015-03-05 DIAGNOSIS — I208 Other forms of angina pectoris: Secondary | ICD-10-CM | POA: Diagnosis not present

## 2015-03-05 NOTE — Progress Notes (Signed)
Daily Session Note  Patient Details  Name: Cassidy Hernandez MRN: [id number] Date of Birth: [birth year] Referring Provider:  Rusty Aus, MD  Encounter Date: 03/05/2015  Check In:     Session Check In - 03/05/15 1611    Check-In   Staff Present Lestine Box BS, ACSM EP-C, Exercise Physiologist;Carroll Enterkin RN, BSN;Diane Joya Gaskins RN, BSN   ER physicians immediately available to respond to emergencies See telemetry face sheet for immediately available ER MD   Medication changes reported     No   Fall or balance concerns reported    No   Warm-up and Cool-down Performed on first and last piece of equipment   VAD Patient? No   Pain Assessment   Currently in Pain? No/denies         Goals Met:  Proper associated with RPD/PD & O2 Sat Exercise tolerated well No report of cardiac concerns or symptoms Strength training completed today  Goals Unmet:  Not Applicable  Goals Comments:    Dr. Emily Filbert is Medical Director for [place] and [facility].

## 2015-03-16 ENCOUNTER — Encounter: Payer: Medicare Other | Attending: Internal Medicine | Admitting: *Deleted

## 2015-03-16 ENCOUNTER — Encounter: Payer: Self-pay | Admitting: *Deleted

## 2015-03-16 DIAGNOSIS — I208 Other forms of angina pectoris: Secondary | ICD-10-CM

## 2015-03-16 NOTE — Progress Notes (Signed)
Daily Session Note  Patient Details  Name: Cassidy Hernandez MRN: [id number] Date of Birth: July 19, 1941 Referring Provider:  Rusty Aus, MD  Encounter Date: 03/16/2015  Check In:     Session Check In - 03/16/15 1816    Check-In   Staff Present Heath Lark RN, BSN, CCRP;Carroll Enterkin RN, BSN;Kelly Amedeo Plenty BS, ACSM CEP Exercise Physiologist   ER physicians immediately available to respond to emergencies See telemetry face sheet for immediately available ER MD   Medication changes reported     No   Fall or balance concerns reported    No   Warm-up and Cool-down Performed on first and last piece of equipment   VAD Patient? No   Pain Assessment   Currently in Pain? No/denies   Multiple Pain Sites No           Exercise Prescription Changes - 03/16/15 0700    Exercise Review   Progression Yes   Response to Exercise   Blood Pressure (Admit) 140/72 mmHg   Blood Pressure (Exercise) 144/74 mmHg   Blood Pressure (Exit) 126/68 mmHg   Heart Rate (Admit) 81 bpm   Heart Rate (Exercise) 100 bpm   Heart Rate (Exit) 68 bpm   Rating of Perceived Exertion (Exercise) 12   Symptoms No   Frequency Add 1 additional day to program exercise sessions.   Duration Progress to 30 minutes of continuous aerobic without signs/symptoms of physical distress   Intensity --  REST + 20; 10-15 beats below ischemic threshold   Progression Continue progressive overload as per policy without signs/symptoms or physical distress.   Resistance Training   Training Prescription Yes   Weight 3   Reps 10-15   Interval Training   Interval Training Yes   Equipment NuStep   Comments NuStep L 4-5/40-60W   Treadmill   MPH 2.5   Grade 0   NuStep   Level 5   Watts 60   Minutes 20      Goals Met:  Independence with exercise equipment Exercise tolerated well No report of cardiac concerns or symptoms Strength training completed today  Goals Unmet:  Not Applicable  Goals Comments: Doing well with exercise  prescription progression.    Dr. Emily Filbert is Medical Director for [place] and [facility].

## 2015-03-18 ENCOUNTER — Encounter: Payer: Medicare Other | Admitting: *Deleted

## 2015-03-18 VITALS — Ht 61.1 in | Wt 182.6 lb

## 2015-03-18 DIAGNOSIS — I208 Other forms of angina pectoris: Secondary | ICD-10-CM | POA: Diagnosis not present

## 2015-03-18 NOTE — Patient Instructions (Signed)
Discharge Instructions  Patient Details  Name: Cassidy Hernandez MRN: [id number] Date of Birth: [birth year] Referring Provider:  Rusty Aus, MD   Number of Visits: 36  Reason for Discharge:  Patient reached a stable level of exercise. Patient independent in their exercise.  Smoking History:  History  Smoking status  . Never Smoker   Smokeless tobacco  . Not on file    Diagnosis:  Stable angina  Initial Exercise Prescription:     Initial Exercise Prescription - 12/29/14 1500    Date of Initial Exercise Prescription   Date 12/29/14   Treadmill   MPH 2.2   Grade 0   Minutes 10   Bike   Level 0.4   Watts 12   Minutes 10   Recumbant Bike   Level 3   RPM 40   Watts 25   Minutes 15   NuStep   Level 3   Watts 40   Minutes 15   Arm Ergometer   Level 1   Watts 8   Minutes 10   Arm/Foot Ergometer   Level 4   Watts 12   Minutes 10   Cybex   Level 2   RPM 50   Minutes 10   Recumbant Elliptical   Level 1   Watts 10   Minutes 10   Elliptical   Level 1   Speed 3   Minutes 1   REL-XR   Level 2   Watts 30   Minutes 15   Prescription Details   Frequency (times per week) 3   Duration Progress to 30 minutes of continuous aerobic without signs/symptoms of physical distress   Intensity   THRR REST +  --  10-15 contractions below ischemic threshold    Ratings of Perceived Exertion 11-15   Progression Continue progressive overload as per policy without signs/symptoms or physical distress.   Resistance Training   Training Prescription Yes   Weight 2   Reps 10-12      Discharge Exercise Prescription (Final Exercise Prescription Changes):     Exercise Prescription Changes - 03/18/15 1700    Exercise Review   Progression Yes   Response to Exercise   Blood Pressure (Admit) 140/72 mmHg   Blood Pressure (Exercise) 144/74 mmHg   Blood Pressure (Exit) 126/68 mmHg   Heart Rate (Admit) 81 bpm   Heart Rate (Exercise) 100 bpm   Heart Rate (Exit) 68 bpm    Rating of Perceived Exertion (Exercise) 12   Symptoms No   Frequency Add 1 additional day to program exercise sessions.   Duration Progress to 30 minutes of continuous aerobic without signs/symptoms of physical distress   Intensity --  REST + 20; 10-15 beats below ischemic threshold   Progression Continue progressive overload as per policy without signs/symptoms or physical distress.   Resistance Training   Training Prescription Yes   Weight 3   Reps 10-15   Interval Training   Interval Training Yes   Equipment NuStep   Comments NuStep L 4-5/40-60W   Treadmill   MPH 2.5   Grade 0   NuStep   Level 5   Watts 60   Minutes 20   Home Exercise Plan   Plans to continue exercise at [facility] (comment)  [employer] [facility]      Functional Capacity:     6 Minute Walk      12/29/14 1510 03/18/15 1709     6 Minute Walk   Phase Initial Discharge  Distance 1180 feet 1420 feet    Distance % Change  20 %    Walk Time 6 minutes 6 minutes    Resting HR 65 bpm 69 bpm    Resting BP 142/78 mmHg 120/62 mmHg    Max Ex. HR 95 bpm 92 bpm    Max Ex. BP 158/68 mmHg 144/62 mmHg    RPE 11 12    Symptoms No No       Quality of Life:     Quality of Life - 03/11/15 1458    Quality of Life Scores   Health/Function Post 28.71 %   Health/Function % Change 8.1 %   Socioeconomic Post 28.21 %   Socioeconomic % Change 0 %   Psych/Spiritual Post 28.29 %   Psych/Spiritual % Change -2.9 %   Family Post 30 %   Family % Change 0 %   GLOBAL Post 28.63 %   GLOBAL % Change 2.5 %      Personal Goals: Goals established at orientation with interventions provided to work toward goal.     Personal Goals and Risk Factors at Admission - 12/29/14 1445    Personal Goals and Risk Factors on Admission    Weight Management Yes   Intervention Learn and follow the exercise and diet guidelines while in the program. Utilize the nutrition and education classes to help gain knowledge of the diet  and exercise expectations in the program   Diabetes No   Hypertension Yes   Goal Participant will see blood pressure controlled within the values of 140/26mm/Hg or within value directed by their physician.   Intervention Provide nutrition & aerobic exercise along with prescribed medications to achieve BP 140/90 or less.   Lipids Yes   Goal Cholesterol controlled with medications as prescribed, with individualized exercise RX and with personalized nutrition plan. Value goals: LDL < 70mg , HDL > 40mg . Participant states understanding of desired cholesterol values and following prescriptions.   Intervention Provide nutrition & aerobic exercise along with prescribed medications to achieve LDL 70mg , HDL >40mg .       Personal Goals Discharge:   Nutrition & Weight - Outcomes:     Pre Biometrics - 12/29/14 1511    Pre Biometrics   Height 5' 1.1" (1.552 m)   Weight 182 lb 4.8 oz (82.691 kg)   Waist Circumference 40.25 inches   Hip Circumference 45.5 inches   Waist to Hip Ratio 0.88 %   BMI (Calculated) 34.4         Post Biometrics - 03/18/15 1708     Post  Biometrics   Height 5' 1.1" (1.552 m)   Weight 182 lb 9.6 oz (82.827 kg)   Waist Circumference 39.25 inches   Hip Circumference 44.5 inches   Waist to Hip Ratio 0.88 %   BMI (Calculated) 34.5      Nutrition:     Nutrition Therapy & Goals - 01/21/15 1609    Nutrition Therapy   Diet DASH diet guidelines   Fruits and Vegetables 5 servings/day   Personal Nutrition Goals   Personal Goal #1 Try a sugar-free mix-in flavoring for water, or Propel flavored water to make sure you take in enough fluid.   Personal Goal #2 Choose fruit or carrots for at least one snack per day.   Personal Goal #3 Try yogurt and frozen fruit in place of ice cream, or sunchips or unsalted pretzels in place of potato chips.   Comments Patient reports some difficulty with stress eating and  eats out frequently since husband's death 2 years ago.       Nutrition Discharge:     Rate Your Plate - 09/90/68 9340    Rate Your Plate Scores   Post Score 63   Post Score % 70 %      Education Questionnaire Score:     Knowledge Questionnaire Score - 03/11/15 1501    Knowledge Questionnaire Score   Post Score 28/28      Goals reviewed with patient; copy given to patient.

## 2015-03-18 NOTE — Progress Notes (Signed)
Cardiac Individual Treatment Plan  Patient Details  Name: Cassidy Hernandez MRN: [id number] Date of Birth: 1940/11/01 Referring Provider:  Rusty Aus, MD  Initial Encounter Date:    Visit Diagnosis: Stable angina  Patient's Home Medications on Admission:  Current outpatient prescriptions:  .  amLODipine (NORVASC) 5 MG tablet, Take by mouth., Disp: , Rfl:  .  aspirin EC 81 MG tablet, Take by mouth., Disp: , Rfl:  .  atenolol (TENORMIN) 50 MG tablet, Take by mouth., Disp: , Rfl:  .  chlorthalidone (HYGROTON) 25 MG tablet, TAKE 1/2 TABLET EVERY DAY, Disp: , Rfl:  .  losartan (COZAAR) 100 MG tablet, Take by mouth., Disp: , Rfl:  .  Multiple Vitamin (MULTIVITAMIN) capsule, Take by mouth., Disp: , Rfl:  .  nitroGLYCERIN (NITROSTAT) 0.4 MG SL tablet, Place under the tongue., Disp: , Rfl:  .  omeprazole (PRILOSEC) 20 MG capsule, Take by mouth., Disp: , Rfl:  .  rosuvastatin (CRESTOR) 20 MG tablet, Take by mouth., Disp: , Rfl:  .  zolpidem (AMBIEN) 10 MG tablet, Take by mouth., Disp: , Rfl:   Past Medical History: Past Medical History  Diagnosis Date  . Hypertension   . Kidney stone   . Subclavian steal syndrome   . 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 Degenerative arthritis   . GERD (gastroesophageal reflux disease)   . H/O: hysterectomy     Tobacco Use: History  Smoking status  . Never Smoker   Smokeless tobacco  . Not on file    Labs: Recent Review Flowsheet Data    There is no flowsheet data to display.       Exercise Target Goals:    Exercise Program Goal: Individual exercise prescription set with THRR, safety & activity barriers. Participant demonstrates ability to understand and report RPE using BORG scale, to self-measure pulse accurately, and to acknowledge the importance of the exercise prescription.  Exercise Prescription Goal: Starting with aerobic activity 30 plus minutes a day, 3 days per week for initial exercise prescription. Provide home  exercise prescription and guidelines that participant acknowledges understanding prior to discharge.  Activity Barriers & Risk Stratification:     Activity Barriers & Risk Stratification - 12/29/14 1422    Activity Barriers & Risk Stratification   Activity Barriers None   Risk Stratification High      6 Minute Walk:     6 Minute Walk      12/29/14 1510 03/18/15 1709     6 Minute Walk   Phase Initial Discharge    Distance 1180 feet 1420 feet    Distance % Change  20 %    Walk Time 6 minutes 6 minutes    Resting HR 65 bpm 69 bpm    Resting BP 142/78 mmHg 120/62 mmHg    Max Ex. HR 95 bpm 92 bpm    Max Ex. BP 158/68 mmHg 144/62 mmHg    RPE 11 12    Symptoms No No       Initial Exercise Prescription:     Initial Exercise Prescription - 12/29/14 1500    Date of Initial Exercise Prescription   Date 12/29/14   Treadmill   MPH 2.2   Grade 0   Minutes 10   Bike   Level 0.4   Watts 12   Minutes 10   Recumbant Bike   Level 3   RPM 40   Watts 25   Minutes 15   NuStep   Level  3   Watts 40   Minutes 15   Arm Ergometer   Level 1   Watts 8   Minutes 10   Arm/Foot Ergometer   Level 4   Watts 12   Minutes 10   Cybex   Level 2   RPM 50   Minutes 10   Recumbant Elliptical   Level 1   Watts 10   Minutes 10   Elliptical   Level 1   Speed 3   Minutes 1   REL-XR   Level 2   Watts 30   Minutes 15   Prescription Details   Frequency (times per week) 3   Duration Progress to 30 minutes of continuous aerobic without signs/symptoms of physical distress   Intensity   THRR REST +  --  10-15 contractions below ischemic threshold    Ratings of Perceived Exertion 11-15   Progression Continue progressive overload as per policy without signs/symptoms or physical distress.   Resistance Training   Training Prescription Yes   Weight 2   Reps 10-12      Exercise Prescription Changes:     Exercise Prescription Changes      01/07/15 1600 01/14/15 1600 01/21/15  1700 01/27/15 1600 02/17/15 0700   Exercise Review   Progression  Yes Yes Yes Yes   Response to Exercise   Blood Pressure (Admit)    136/72 mmHg 122/72 mmHg   Blood Pressure (Exercise)    144/78 mmHg 142/64 mmHg   Blood Pressure (Exit)    158/70 mmHg 130/60 mmHg   Heart Rate (Admit)    74 bpm 61 bpm   Heart Rate (Exercise)    97 bpm 82 bpm   Heart Rate (Exit)    66 bpm 62 bpm   Rating of Perceived Exertion (Exercise)    11 13   Symptoms     No   Frequency     Add 1 additional day to program exercise sessions.   Duration     Progress to 30 minutes of continuous aerobic without signs/symptoms of physical distress   Intensity     Other (comment)  REST + 20; 10-15 beats below ischemic threshold   Progression    Continue progressive overload as per policy without signs/symptoms or physical distress. Continue progressive overload as per policy without signs/symptoms or physical distress.   Resistance Training   Training Prescription   Yes Yes Yes   Weight   '2 2 2   ' Reps   10-15 10-15 10-15   Interval Training   Interval Training     No   Treadmill   MPH  2.5 2.5 2.5 2.5   Grade  0 0 0 0   NuStep   Level '3 3 4 4 4   ' Watts 45 45 60 60 60   Minutes '20 20 20 20 20     ' 02/25/15 1600 03/02/15 1800 03/16/15 0700 03/18/15 1700     Exercise Review   Progression Yes Yes Yes Yes    Response to Exercise   Blood Pressure (Admit)   140/72 mmHg 140/72 mmHg    Blood Pressure (Exercise)   144/74 mmHg 144/74 mmHg    Blood Pressure (Exit)   126/68 mmHg 126/68 mmHg    Heart Rate (Admit)   81 bpm 81 bpm    Heart Rate (Exercise)   100 bpm 100 bpm    Heart Rate (Exit)   68 bpm 68 bpm    Rating of Perceived Exertion (  Exercise)   12 12    Symptoms   No No    Frequency   Add 1 additional day to program exercise sessions. Add 1 additional day to program exercise sessions.    Duration   Progress to 30 minutes of continuous aerobic without signs/symptoms of physical distress Progress to 30 minutes of  continuous aerobic without signs/symptoms of physical distress    Intensity --  REST + 20; 10-15 beats below ischemic threshold --  REST + 20; 10-15 beats below ischemic threshold --  REST + 20; 10-15 beats below ischemic threshold --  REST + 20; 10-15 beats below ischemic threshold    Progression   Continue progressive overload as per policy without signs/symptoms or physical distress. Continue progressive overload as per policy without signs/symptoms or physical distress.    Resistance Training   Training Prescription Yes Yes Yes Yes    Weight '3 3 3 3    ' Reps 10-15 10-15 10-15 10-15    Interval Training   Interval Training No Yes Yes Yes    Equipment  NuStep NuStep NuStep    Comments  NuStep L 4-5/40-60W NuStep L 4-5/40-60W NuStep L 4-5/40-60W    Treadmill   MPH 2.5 2.5 2.5 2.5    Grade 0 0 0 0    NuStep   Level 4 -- 5 5    Watts 60 -- 60 60    Minutes 20 -- 20 20    Home Exercise Plan   Plans to continue exercise at    [employer] (comment)  [facility]       Discharge Exercise Prescription (Final Exercise Prescription Changes):     Exercise Prescription Changes - 03/18/15 1700    Exercise Review   Progression Yes   Response to Exercise   Blood Pressure (Admit) 140/72 mmHg   Blood Pressure (Exercise) 144/74 mmHg   Blood Pressure (Exit) 126/68 mmHg   Heart Rate (Admit) 81 bpm   Heart Rate (Exercise) 100 bpm   Heart Rate (Exit) 68 bpm   Rating of Perceived Exertion (Exercise) 12   Symptoms No   Frequency Add 1 additional day to program exercise sessions.   Duration Progress to 30 minutes of continuous aerobic without signs/symptoms of physical distress   Intensity --  REST + 20; 10-15 beats below ischemic threshold   Progression Continue progressive overload as per policy without signs/symptoms or physical distress.   Resistance Training   Training Prescription Yes   Weight 3   Reps 10-15   Interval Training   Interval Training Yes   Equipment  NuStep   Comments NuStep L 4-5/40-60W   Treadmill   MPH 2.5   Grade 0   NuStep   Level 5   Watts 60   Minutes 20   Home Exercise Plan   Plans to continue exercise at [employer] (comment)  [employer] [place]      Nutrition:  Target Goals: Understanding of nutrition guidelines, daily intake of sodium <1549m, cholesterol <2047m calories 30% from fat and 7% or less from saturated fats, daily to have 5 or more servings of fruits and vegetables.  Biometrics:     Pre Biometrics - 12/29/14 1511    Pre Biometrics   Height 5' 1.1" (1.552 m)   Weight 182 lb 4.8 oz (82.691 kg)   Waist Circumference 40.25 inches   Hip Circumference 45.5 inches   Waist to Hip Ratio 0.88 %   BMI (Calculated) 34.4  Post Biometrics - 03/18/15 1708     Post  Biometrics   Height 5' 1.1" (1.552 m)   Weight 182 lb 9.6 oz (82.827 kg)   Waist Circumference 39.25 inches   Hip Circumference 44.5 inches   Waist to Hip Ratio 0.88 %   BMI (Calculated) 34.5      Nutrition Therapy Plan and Nutrition Goals:     Nutrition Therapy & Goals - 01/21/15 1609    Nutrition Therapy   Diet DASH diet guidelines   Fruits and Vegetables 5 servings/day   Personal Nutrition Goals   Personal Goal #1 Try a sugar-free mix-in flavoring for water, or Propel flavored water to make sure you take in enough fluid.   Personal Goal #2 Choose fruit or carrots for at least one snack per day.   Personal Goal #3 Try yogurt and frozen fruit in place of ice cream, or sunchips or unsalted pretzels in place of potato chips.   Comments Patient reports some difficulty with stress eating and eats out frequently since husband's death 2 years ago.      Nutrition Discharge: Rate Your Plate Scores:     Rate Your Plate - 48/54/62 7035    Rate Your Plate Scores   Post Score 63   Post Score % 70 %      Nutrition Goals Re-Evaluation:     Nutrition Goals Re-Evaluation      01/15/15 1719 02/11/15 1730 03/18/15 1834        Personal Goal #1 Re-Evaluation   Personal Goal #1 Appt made with dietician. Cassidy Hernandez said she goes out to eat since her husband died 2 years ago. Eats out alot with her friend. She said she used to be "very small".  Has been out of town lately. Hard to eat healthy then. Eating healthier     Goal Progress Seen Yes No Yes     Personal Goal #2 Re-Evaluation   Personal Goal #2   Yes eating more fruits     Goal Progress Seen   Yes     Personal Goal #3 Re-Evaluation   Goal Progress Seen   Yes        Psychosocial: Target Goals: Acknowledge presence or absence of depression, maximize coping skills, provide positive support system. Participant is able to verbalize types and ability to use techniques and skills needed for reducing stress and depression.  Initial Review & Psychosocial Screening:     Initial Psych Review & Screening - 12/29/14 [place]? Yes   Barriers   Psychosocial barriers to participate in program There are no identifiable barriers or psychosocial needs.   Screening Interventions   Interventions Encouraged to exercise      Quality of Life Scores:     Quality of Life - 03/11/15 1458    Quality of Life Scores   Health/Function Post 28.71 %   Health/Function % Change 8.1 %   Socioeconomic Post 28.21 %   Socioeconomic % Change 0 %   Psych/Spiritual Post 28.29 %   Psych/Spiritual % Change -2.9 %   Family Post 30 %   Family % Change 0 %   GLOBAL Post 28.63 %   GLOBAL % Change 2.5 %      PHQ-9:     Recent Review Flowsheet Data    Depression screen [facility] 2/9 03/11/2015 12/29/2014   Decreased Interest 0 0   Down, Depressed, Hopeless 0 0   PHQ - 2 Score 0  0   Altered sleeping 0 0   Tired, decreased energy 0 1   Change in appetite 1 2   Feeling bad or failure about yourself  0 0   Trouble concentrating 0 0   Moving slowly or fidgety/restless 0 0   Suicidal thoughts 0 0   PHQ-9 Score 1 3   Difficult doing work/chores Not  difficult at all Not difficult at all      Psychosocial Evaluation and Intervention:     Psychosocial Evaluation - 01/21/15 1703    Psychosocial Evaluation & Interventions   Interventions Stress management education;Relaxation education;Encouraged to exercise with the program and follow exercise prescription   Comments Counselor met with Cassidy Hernandez today for initial psychosocial evaluation.  She is a 74 year old who has a strong support system and is actively involved in her faith community.  She denies a history of depression or current symptoms and states that her mood is generally positive most of the time.  Cassidy Hernandez states that her appetite is "too good" currently and has met with the dietician to begin working on changing this and losing some weight as one of her goals for the program.  She also reports that she has not been sleeping well the past two weeks with approx. 4 hours per night.  Counselor recommended speaking with Dr. or pharmacist about a natural OTC sleep aid that is sustained release since she reports no problem getting to sleep, only staying asleep.  Counselor will follow up with Cassidy Hernandez about this in the near future.     Continued Psychosocial Services Needed Yes  Cassidy Hernandez will benefit from the psychoeducational components of this program, consistent exercise and improved sleep, which is being addressed.       Psychosocial Re-Evaluation:   Vocational Rehabilitation: Provide vocational rehab assistance to qualifying candidates.   Vocational Rehab Evaluation & Intervention:     Vocational Rehab - 12/29/14 1423    Initial Vocational Rehab Evaluation & Intervention   Assessment shows need for Vocational Rehabilitation No      Education: Education Goals: Education classes will be provided on a weekly basis, covering required topics. Participant will state understanding/return demonstration of topics presented.  Learning Barriers/Preferences:     Learning  Barriers/Preferences - 12/29/14 1423    Learning Barriers/Preferences   Learning Barriers None;Reading      Education Topics: General Nutrition Guidelines/Fats and Fiber: -Group instruction provided by verbal, written material, models and posters to present the general guidelines for heart healthy nutrition. Gives an explanation and review of dietary fats and fiber.   Controlling Sodium/Reading Food Labels: -Group verbal and written material supporting the discussion of sodium use in heart healthy nutrition. Review and explanation with models, verbal and written materials for utilization of the food label.   Exercise Physiology & Risk Factors: - Group verbal and written instruction with models to review the exercise physiology of the cardiovascular system and associated critical values. Details cardiovascular disease risk factors and the goals associated with each risk factor.          Cardiac Rehab from 03/18/2015 in [facility] Cardiac Rehab   Date  02/18/15   Educator  R.M.    Instruction Review Code  2- meets goals/outcomes      Aerobic Exercise & Resistance Training: - Gives group verbal and written discussion on the health impact of inactivity. On the components of aerobic and resistive training programs and the benefits of this training and how to safely progress  through these programs.      Cardiac Rehab from 03/18/2015 in [facility] Cardiac Rehab   Date  02/23/15   Educator  SW   Instruction Review Code  2- meets goals/outcomes      Flexibility, Balance, General Exercise Guidelines: - Provides group verbal and written instruction on the benefits of flexibility and balance training programs. Provides general exercise guidelines with specific guidelines to those with heart or lung disease. Demonstration and skill practice provided.      Cardiac Rehab from 03/18/2015 in [facility] Cardiac Rehab   Date  03/02/15   Educator  KM   Instruction Review Code  2- meets goals/outcomes      Stress  Management: - Provides group verbal and written instruction about the health risks of elevated stress, cause of high stress, and healthy ways to reduce stress.      Cardiac Rehab from 03/18/2015 in [facility] Cardiac Rehab   Date  03/04/15   Educator  [facility]   Instruction Review Code  2- meets goals/outcomes      Depression: - Provides group verbal and written instruction on the correlation between heart/lung disease and depressed mood, treatment options, and the stigmas associated with seeking treatment.   Anatomy & Physiology of the Heart: - Group verbal and written instruction and models provide basic cardiac anatomy and physiology, with the coronary electrical and arterial systems. Review of: AMI, Angina, Valve disease, Heart Failure, Cardiac Arrhythmia, Pacemakers, and the ICD.      Cardiac Rehab from 03/18/2015 in [facility] Cardiac Rehab   Date  01/12/15   Educator  SB   Instruction Review Code  2- meets goals/outcomes      Cardiac Procedures: - Group verbal and written instruction and models to describe the testing methods done to diagnose heart disease. Reviews the outcomes of the test results. Describes the treatment choices: Medical Management, Angioplasty, or Coronary Bypass Surgery.      Cardiac Rehab from 03/18/2015 in [facility] Cardiac Rehab   Date  02/11/15   Educator  C. Aliza Moret, RN   Instruction Review Code  2- meets goals/outcomes      Cardiac Medications: - Group verbal and written instruction to review commonly prescribed medications for heart disease. Reviews the medication, class of the drug, and side effects. Includes the steps to properly store meds and maintain the prescription regimen.      Cardiac Rehab from 03/18/2015 in [facility] Cardiac Rehab   Date  02/16/15   Educator  SB   Instruction Review Code  2- meets goals/outcomes      Go Sex-Intimacy & Heart Disease, Get SMART - Goal Setting: - Group verbal and written instruction through game format to discuss heart disease and  the return to sexual intimacy. Provides group verbal and written material to discuss and apply goal setting through the application of the S.M.A.R.T. Method.      Cardiac Rehab from 03/18/2015 in [facility] Cardiac Rehab   Date  02/11/15   Educator  C. Aryah Doering, RN   Instruction Review Code  2- meets goals/outcomes      Other Matters of the Heart: - Provides group verbal, written materials and models to describe Heart Failure, Angina, Valve Disease, and Diabetes in the realm of heart disease. Includes description of the disease process and treatment options available to the cardiac patient.      Cardiac Rehab from 03/18/2015 in [facility] Cardiac Rehab   Date  03/18/15   Educator  DW   Instruction Review Code  2- meets  goals/outcomes      Exercise & Equipment Safety: - Individual verbal instruction and demonstration of equipment use and safety with use of the equipment.      Cardiac Rehab from 03/18/2015 in  [facility] Cardiac Rehab   Date  12/29/14   Educator  C.EnterkinRN   Instruction Review Code  2- meets goals/outcomes      Infection Prevention: - Provides verbal and written material to individual with discussion of infection control including proper hand washing and proper equipment cleaning during exercise session.      Cardiac Rehab from 03/18/2015 in [facility] Cardiac Rehab   Date  12/29/14   Educator  C.EnterkinRN   Instruction Review Code  2- meets goals/outcomes      Falls Prevention: - Provides verbal and written material to individual with discussion of falls prevention and safety.      Cardiac Rehab from 03/18/2015 in [facility] Cardiac Rehab   Date  12/29/14   Educator  C. [place]   Instruction Review Code  2- meets goals/outcomes      Diabetes: - Individual verbal and written instruction to review signs/symptoms of diabetes, desired ranges of glucose level fasting, after meals and with exercise. Advice that pre and post exercise glucose checks will be done for 3 sessions at entry of  program.    Knowledge Questionnaire Score:     Knowledge Questionnaire Score - 03/11/15 1501    Knowledge Questionnaire Score   Post Score 28/28      Personal Goals and Risk Factors at Admission:     Personal Goals and Risk Factors at Admission - 12/29/14 1445    Personal Goals and Risk Factors on Admission    Weight Management Yes   Intervention Learn and follow the exercise and diet guidelines while in the program. Utilize the nutrition and education classes to help gain knowledge of the diet and exercise expectations in the program   Diabetes No   Hypertension Yes   Goal Participant will see blood pressure controlled within the values of 140/11m/Hg or within value directed by their physician.   Intervention Provide nutrition & aerobic exercise along with prescribed medications to achieve BP 140/90 or less.   Lipids Yes   Goal Cholesterol controlled with medications as prescribed, with individualized exercise RX and with personalized nutrition plan. Value goals: LDL < 758m HDL > 4040mParticipant states understanding of desired cholesterol values and following prescriptions.   Intervention Provide nutrition & aerobic exercise along with prescribed medications to achieve LDL <57m54mDL >40mg57m   Personal Goals and Risk Factors Review:      Goals and Risk Factor Review      01/15/15 1720 02/11/15 1730 03/18/15 1835       Increase Aerobic Exercise and Physical Activity   Goals Progress/Improvement seen  Yes Yes Yes        Personal Goals Discharge:     Comments: Ready for discharge. Will get a personal trainer here before she joins her local gym.

## 2015-03-18 NOTE — Progress Notes (Signed)
Daily Session Note  Patient Details  Name: Cassidy Hernandez MRN: [id number] Date of Birth: August 08, 1941 Referring Provider:  Rusty Aus, MD  Encounter Date: 03/18/2015  Check In:     Session Check In - 03/18/15 1623    Check-In   Staff Present Diane Joya Gaskins RN, BSN;Kanija Remmel Dillard Essex MS, ACSM CEP Exercise Physiologist;Carroll Enterkin RN, BSN   ER physicians immediately available to respond to emergencies See telemetry face sheet for immediately available ER MD   Medication changes reported     No   Fall or balance concerns reported    No   Warm-up and Cool-down Performed on first and last piece of equipment   VAD Patient? No   VAD patient   Has back up controller? No   Pain Assessment   Currently in Pain? No/denies   Multiple Pain Sites No         Goals Met:  Independence with exercise equipment Exercise tolerated well No report of cardiac concerns or symptoms Strength training completed today  Goals Unmet:  Not Applicable  Goals Comments: Patient is approaching graduation of the program and home exercise plans were discussed. Details of the patient's exercise prescription and what they need to do in order to continue the prescription and progress with exercise were outlined and the patient verbalized understanding. The patient plans to complete all exercise at [facility] in [place].  20% increase on post 6MW. Honesty was very encouraged by this.   Dr. Emily Filbert is Medical Director for [place] and [facility].

## 2015-03-18 NOTE — Progress Notes (Signed)
Discharge Summary  Patient Details  Name: Cassidy Hernandez MRN: [id number] Date of Birth: [birth year] Referring Provider:  Rusty Aus, MD   Number of Visits: 36  Reason for Discharge:  Patient reached a stable level of exercise. Patient independent in their exercise.  Smoking History:  History  Smoking status  . Never Smoker   Smokeless tobacco  . Not on file    Diagnosis:  Stable angina  ADL [facility]:   Initial Exercise Prescription:     Initial Exercise Prescription - 12/29/14 1500    Date of Initial Exercise Prescription   Date 12/29/14   Treadmill   MPH 2.2   Grade 0   Minutes 10   Bike   Level 0.4   Watts 12   Minutes 10   Recumbant Bike   Level 3   RPM 40   Watts 25   Minutes 15   NuStep   Level 3   Watts 40   Minutes 15   Arm Ergometer   Level 1   Watts 8   Minutes 10   Arm/Foot Ergometer   Level 4   Watts 12   Minutes 10   Cybex   Level 2   RPM 50   Minutes 10   Recumbant Elliptical   Level 1   Watts 10   Minutes 10   Elliptical   Level 1   Speed 3   Minutes 1   REL-XR   Level 2   Watts 30   Minutes 15   Prescription Details   Frequency (times per week) 3   Duration Progress to 30 minutes of continuous aerobic without signs/symptoms of physical distress   Intensity   THRR REST +  --  10-15 contractions below ischemic threshold    Ratings of Perceived Exertion 11-15   Progression Continue progressive overload as per policy without signs/symptoms or physical distress.   Resistance Training   Training Prescription Yes   Weight 2   Reps 10-12      Discharge Exercise Prescription (Final Exercise Prescription Changes):     Exercise Prescription Changes - 03/18/15 1700    Exercise Review   Progression Yes   Response to Exercise   Blood Pressure (Admit) 140/72 mmHg   Blood Pressure (Exercise) 144/74 mmHg   Blood Pressure (Exit) 126/68 mmHg   Heart Rate (Admit) 81 bpm   Heart Rate (Exercise) 100 bpm   Heart Rate (Exit) 68  bpm   Rating of Perceived Exertion (Exercise) 12   Symptoms No   Frequency Add 1 additional day to program exercise sessions.   Duration Progress to 30 minutes of continuous aerobic without signs/symptoms of physical distress   Intensity --  REST + 20; 10-15 beats below ischemic threshold   Progression Continue progressive overload as per policy without signs/symptoms or physical distress.   Resistance Training   Training Prescription Yes   Weight 3   Reps 10-15   Interval Training   Interval Training Yes   Equipment NuStep   Comments NuStep L 4-5/40-60W   Treadmill   MPH 2.5   Grade 0   NuStep   Level 5   Watts 60   Minutes 20   Home Exercise Plan   Plans to continue exercise at [facility] (comment)  [employer] [facility]      Functional Capacity:     6 Minute Walk      12/29/14 1510 03/18/15 1709     6 Minute Walk  Phase Initial Discharge    Distance 1180 feet 1420 feet    Distance % Change  20 %    Walk Time 6 minutes 6 minutes    Resting HR 65 bpm 69 bpm    Resting BP 142/78 mmHg 120/62 mmHg    Max Ex. HR 95 bpm 92 bpm    Max Ex. BP 158/68 mmHg 144/62 mmHg    RPE 11 12    Symptoms No No       Psychological, QOL, Others - Outcomes: PHQ 2/9: Depression screen [facility] 2/9 03/11/2015 12/29/2014  Decreased Interest 0 0  Down, Depressed, Hopeless 0 0  PHQ - 2 Score 0 0  Altered sleeping 0 0  Tired, decreased energy 0 1  Change in appetite 1 2  Feeling bad or failure about yourself  0 0  Trouble concentrating 0 0  Moving slowly or fidgety/restless 0 0  Suicidal thoughts 0 0  PHQ-9 Score 1 3  Difficult doing work/chores Not difficult at all Not difficult at all    Quality of Life:     Quality of Life - 03/11/15 1458    Quality of Life Scores   Health/Function Post 28.71 %   Health/Function % Change 8.1 %   Socioeconomic Post 28.21 %   Socioeconomic % Change 0 %   Psych/Spiritual Post 28.29 %   Psych/Spiritual % Change -2.9 %   Family Post 30  %   Family % Change 0 %   GLOBAL Post 28.63 %   GLOBAL % Change 2.5 %      Personal Goals: Goals established at orientation with interventions provided to work toward goal.     Personal Goals and Risk Factors at Admission - 12/29/14 1445    Personal Goals and Risk Factors on Admission    Weight Management Yes   Intervention Learn and follow the exercise and diet guidelines while in the program. Utilize the nutrition and education classes to help gain knowledge of the diet and exercise expectations in the program   Diabetes No   Hypertension Yes   Goal Participant will see blood pressure controlled within the values of 140/76mm/Hg or within value directed by their physician.   Intervention Provide nutrition & aerobic exercise along with prescribed medications to achieve BP 140/90 or less.   Lipids Yes   Goal Cholesterol controlled with medications as prescribed, with individualized exercise RX and with personalized nutrition plan. Value goals: LDL < 70mg , HDL > 40mg . Participant states understanding of desired cholesterol values and following prescriptions.   Intervention Provide nutrition & aerobic exercise along with prescribed medications to achieve LDL 70mg , HDL >40mg .       Personal Goals Discharge:   Nutrition & Weight - Outcomes:     Pre Biometrics - 12/29/14 1511    Pre Biometrics   Height 5' 1.1" (1.552 m)   Weight 182 lb 4.8 oz (82.691 kg)   Waist Circumference 40.25 inches   Hip Circumference 45.5 inches   Waist to Hip Ratio 0.88 %   BMI (Calculated) 34.4         Post Biometrics - 03/18/15 1708     Post  Biometrics   Height 5' 1.1" (1.552 m)   Weight 182 lb 9.6 oz (82.827 kg)   Waist Circumference 39.25 inches   Hip Circumference 44.5 inches   Waist to Hip Ratio 0.88 %   BMI (Calculated) 34.5      Nutrition:     Nutrition Therapy & Goals -  01/21/15 1609    Nutrition Therapy   Diet DASH diet guidelines   Fruits and Vegetables 5 servings/day    Personal Nutrition Goals   Personal Goal #1 Try a sugar-free mix-in flavoring for water, or Propel flavored water to make sure you take in enough fluid.   Personal Goal #2 Choose fruit or carrots for at least one snack per day.   Personal Goal #3 Try yogurt and frozen fruit in place of ice cream, or sunchips or unsalted pretzels in place of potato chips.   Comments Patient reports some difficulty with stress eating and eats out frequently since husband's death 2 years ago.      Nutrition Discharge:     Rate Your Plate - 70/48/88 9169    Rate Your Plate Scores   Post Score 63   Post Score % 70 %      Education Questionnaire Score:     Knowledge Questionnaire Score - 03/11/15 1501    Knowledge Questionnaire Score   Post Score 28/28      Goals reviewed with patient; copy given to patient.

## 2015-03-23 DIAGNOSIS — I208 Other forms of angina pectoris: Secondary | ICD-10-CM

## 2015-03-23 NOTE — Progress Notes (Signed)
Daily Session Note  Patient Details  Name: Cassidy Hernandez MRN: [id number] Date of Birth: 19-Jan-1941 Referring Provider:  Rusty Aus, MD  Encounter Date: 03/23/2015  Check In:     Session Check In - 03/23/15 1612    Check-In   Staff Present Lestine Box BS, ACSM EP-C, Exercise Physiologist;Carroll Enterkin RN, BSN;Mary Kellie Shropshire RN   ER physicians immediately available to respond to emergencies See telemetry face sheet for immediately available ER MD   Medication changes reported     No   Fall or balance concerns reported    No   Warm-up and Cool-down Performed on first and last piece of equipment   VAD Patient? No   VAD patient   Has back up controller? No   Pain Assessment   Currently in Pain? No/denies         Goals Met:  Proper associated with RPD/PD & O2 Sat Exercise tolerated well No report of cardiac concerns or symptoms Strength training completed today  Goals Unmet:  Not Applicable  Goals Comments:    Dr. Emily Filbert is Medical Director for [facility] and [facility].

## 2015-03-25 ENCOUNTER — Other Ambulatory Visit: Payer: Self-pay | Admitting: *Deleted

## 2015-03-25 DIAGNOSIS — I2089 Other forms of angina pectoris: Secondary | ICD-10-CM

## 2015-03-25 DIAGNOSIS — I208 Other forms of angina pectoris: Secondary | ICD-10-CM

## 2015-03-25 NOTE — Progress Notes (Signed)
Cardiac Individual Treatment Plan  Patient Details  Name: Cassidy Hernandez MRN: [id number] Date of Birth: 10/04/1940 Referring Provider:  Rusty Aus, MD  Initial Encounter Date:    Visit Diagnosis: Stable angina  Patient's Home Medications on Admission:  Current outpatient prescriptions:  .  amLODipine (NORVASC) 5 MG tablet, Take by mouth., Disp: , Rfl:  .  aspirin EC 81 MG tablet, Take by mouth., Disp: , Rfl:  .  atenolol (TENORMIN) 50 MG tablet, Take by mouth., Disp: , Rfl:  .  chlorthalidone (HYGROTON) 25 MG tablet, TAKE 1/2 TABLET EVERY DAY, Disp: , Rfl:  .  losartan (COZAAR) 100 MG tablet, Take by mouth., Disp: , Rfl:  .  Multiple Vitamin (MULTIVITAMIN) capsule, Take by mouth., Disp: , Rfl:  .  nitroGLYCERIN (NITROSTAT) 0.4 MG SL tablet, Place under the tongue., Disp: , Rfl:  .  omeprazole (PRILOSEC) 20 MG capsule, Take by mouth., Disp: , Rfl:  .  rosuvastatin (CRESTOR) 20 MG tablet, Take by mouth., Disp: , Rfl:  .  zolpidem (AMBIEN) 10 MG tablet, Take by mouth., Disp: , Rfl:   Past Medical History: Past Medical History  Diagnosis Date  . Hypertension   . Kidney stone   . Subclavian steal syndrome   . 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 Degenerative arthritis   . GERD (gastroesophageal reflux disease)   . H/O: hysterectomy     Tobacco Use: History  Smoking status  . Never Smoker   Smokeless tobacco  . Not on file    Labs: Recent Review Flowsheet Data    There is no flowsheet data to display.       Exercise Target Goals:    Exercise Program Goal: Individual exercise prescription set with THRR, safety & activity barriers. Participant demonstrates ability to understand and report RPE using BORG scale, to self-measure pulse accurately, and to acknowledge the importance of the exercise prescription.  Exercise Prescription Goal: Starting with aerobic activity 30 plus minutes a day, 3 days per week for initial exercise prescription. Provide home  exercise prescription and guidelines that participant acknowledges understanding prior to discharge.  Activity Barriers & Risk Stratification:     Activity Barriers & Risk Stratification - 12/29/14 1422    Activity Barriers & Risk Stratification   Activity Barriers None   Risk Stratification High      6 Minute Walk:     6 Minute Walk      12/29/14 1510 03/18/15 1709     6 Minute Walk   Phase Initial Discharge    Distance 1180 feet 1420 feet    Distance % Change  20 %    Walk Time 6 minutes 6 minutes    Resting HR 65 bpm 69 bpm    Resting BP 142/78 mmHg 120/62 mmHg    Max Ex. HR 95 bpm 92 bpm    Max Ex. BP 158/68 mmHg 144/62 mmHg    RPE 11 12    Symptoms No No       Initial Exercise Prescription:     Initial Exercise Prescription - 12/29/14 1500    Date of Initial Exercise Prescription   Date 12/29/14   Treadmill   MPH 2.2   Grade 0   Minutes 10   Bike   Level 0.4   Watts 12   Minutes 10   Recumbant Bike   Level 3   RPM 40   Watts 25   Minutes 15   NuStep   Level  3   Watts 40   Minutes 15   Arm Ergometer   Level 1   Watts 8   Minutes 10   Arm/Foot Ergometer   Level 4   Watts 12   Minutes 10   Cybex   Level 2   RPM 50   Minutes 10   Recumbant Elliptical   Level 1   Watts 10   Minutes 10   Elliptical   Level 1   Speed 3   Minutes 1   REL-XR   Level 2   Watts 30   Minutes 15   Prescription Details   Frequency (times per week) 3   Duration Progress to 30 minutes of continuous aerobic without signs/symptoms of physical distress   Intensity   THRR REST +  --  10-15 contractions below ischemic threshold    Ratings of Perceived Exertion 11-15   Progression Continue progressive overload as per policy without signs/symptoms or physical distress.   Resistance Training   Training Prescription Yes   Weight 2   Reps 10-12      Exercise Prescription Changes:     Exercise Prescription Changes      01/07/15 1600 01/14/15 1600 01/21/15  1700 01/27/15 1600 02/17/15 0700   Exercise Review   Progression  Yes Yes Yes Yes   Response to Exercise   Blood Pressure (Admit)    136/72 mmHg 122/72 mmHg   Blood Pressure (Exercise)    144/78 mmHg 142/64 mmHg   Blood Pressure (Exit)    158/70 mmHg 130/60 mmHg   Heart Rate (Admit)    74 bpm 61 bpm   Heart Rate (Exercise)    97 bpm 82 bpm   Heart Rate (Exit)    66 bpm 62 bpm   Rating of Perceived Exertion (Exercise)    11 13   Symptoms     No   Frequency     Add 1 additional day to program exercise sessions.   Duration     Progress to 30 minutes of continuous aerobic without signs/symptoms of physical distress   Intensity     Other (comment)  REST + 20; 10-15 beats below ischemic threshold   Progression    Continue progressive overload as per policy without signs/symptoms or physical distress. Continue progressive overload as per policy without signs/symptoms or physical distress.   Resistance Training   Training Prescription   Yes Yes Yes   Weight   '2 2 2   ' Reps   10-15 10-15 10-15   Interval Training   Interval Training     No   Treadmill   MPH  2.5 2.5 2.5 2.5   Grade  0 0 0 0   NuStep   Level '3 3 4 4 4   ' Watts 45 45 60 60 60   Minutes '20 20 20 20 20     ' 02/25/15 1600 03/02/15 1800 03/16/15 0700 03/18/15 1700     Exercise Review   Progression Yes Yes Yes Yes    Response to Exercise   Blood Pressure (Admit)   140/72 mmHg 140/72 mmHg    Blood Pressure (Exercise)   144/74 mmHg 144/74 mmHg    Blood Pressure (Exit)   126/68 mmHg 126/68 mmHg    Heart Rate (Admit)   81 bpm 81 bpm    Heart Rate (Exercise)   100 bpm 100 bpm    Heart Rate (Exit)   68 bpm 68 bpm    Rating of Perceived Exertion (  Exercise)   12 12    Symptoms   No No    Frequency   Add 1 additional day to program exercise sessions. Add 1 additional day to program exercise sessions.    Duration   Progress to 30 minutes of continuous aerobic without signs/symptoms of physical distress Progress to 30 minutes of  continuous aerobic without signs/symptoms of physical distress    Intensity --  REST + 20; 10-15 beats below ischemic threshold --  REST + 20; 10-15 beats below ischemic threshold --  REST + 20; 10-15 beats below ischemic threshold --  REST + 20; 10-15 beats below ischemic threshold    Progression   Continue progressive overload as per policy without signs/symptoms or physical distress. Continue progressive overload as per policy without signs/symptoms or physical distress.    Resistance Training   Training Prescription Yes Yes Yes Yes    Weight '3 3 3 3    ' Reps 10-15 10-15 10-15 10-15    Interval Training   Interval Training No Yes Yes Yes    Equipment  NuStep NuStep NuStep    Comments  NuStep L 4-5/40-60W NuStep L 4-5/40-60W NuStep L 4-5/40-60W    Treadmill   MPH 2.5 2.5 2.5 2.5    Grade 0 0 0 0    NuStep   Level 4 -- 5 5    Watts 60 -- 60 60    Minutes 20 -- 20 20    Home Exercise Plan   Plans to continue exercise at    [employer] (comment)  [place]       Discharge Exercise Prescription (Final Exercise Prescription Changes):     Exercise Prescription Changes - 03/18/15 1700    Exercise Review   Progression Yes   Response to Exercise   Blood Pressure (Admit) 140/72 mmHg   Blood Pressure (Exercise) 144/74 mmHg   Blood Pressure (Exit) 126/68 mmHg   Heart Rate (Admit) 81 bpm   Heart Rate (Exercise) 100 bpm   Heart Rate (Exit) 68 bpm   Rating of Perceived Exertion (Exercise) 12   Symptoms No   Frequency Add 1 additional day to program exercise sessions.   Duration Progress to 30 minutes of continuous aerobic without signs/symptoms of physical distress   Intensity --  REST + 20; 10-15 beats below ischemic threshold   Progression Continue progressive overload as per policy without signs/symptoms or physical distress.   Resistance Training   Training Prescription Yes   Weight 3   Reps 10-15   Interval Training   Interval Training Yes   Equipment  NuStep   Comments NuStep L 4-5/40-60W   Treadmill   MPH 2.5   Grade 0   NuStep   Level 5   Watts 60   Minutes 20   Home Exercise Plan   Plans to continue exercise at [employer] (comment)  [employer] [place]      Nutrition:  Target Goals: Understanding of nutrition guidelines, daily intake of sodium <1531m, cholesterol <2010m calories 30% from fat and 7% or less from saturated fats, daily to have 5 or more servings of fruits and vegetables.  Biometrics:     Pre Biometrics - 12/29/14 1511    Pre Biometrics   Height 5' 1.1" (1.552 m)   Weight 182 lb 4.8 oz (82.691 kg)   Waist Circumference 40.25 inches   Hip Circumference 45.5 inches   Waist to Hip Ratio 0.88 %   BMI (Calculated) 34.4  Post Biometrics - 03/18/15 1708     Post  Biometrics   Height 5' 1.1" (1.552 m)   Weight 182 lb 9.6 oz (82.827 kg)   Waist Circumference 39.25 inches   Hip Circumference 44.5 inches   Waist to Hip Ratio 0.88 %   BMI (Calculated) 34.5      Nutrition Therapy Plan and Nutrition Goals:     Nutrition Therapy & Goals - 01/21/15 1609    Nutrition Therapy   Diet DASH diet guidelines   Fruits and Vegetables 5 servings/day   Personal Nutrition Goals   Personal Goal #1 Try a sugar-free mix-in flavoring for water, or Propel flavored water to make sure you take in enough fluid.   Personal Goal #2 Choose fruit or carrots for at least one snack per day.   Personal Goal #3 Try yogurt and frozen fruit in place of ice cream, or sunchips or unsalted pretzels in place of potato chips.   Comments Patient reports some difficulty with stress eating and eats out frequently since husband's death 2 years ago.      Nutrition Discharge: Rate Your Plate Scores:     Rate Your Plate - 60/73/71 0626    Rate Your Plate Scores   Post Score 63   Post Score % 70 %      Nutrition Goals Re-Evaluation:     Nutrition Goals Re-Evaluation      01/15/15 1719 02/11/15 1730 03/18/15 1834  03/25/15 1124     Personal Goal #1 Re-Evaluation   Personal Goal #1 Appt made with dietician. Chasitty said she goes out to eat since her husband died 2 years ago. Eats out alot with her friend. She said she used to be "very small".  Has been out of town lately. Hard to eat healthy then. Eating healthier Doing well with her eating healthy.    Goal Progress Seen Yes No Yes Yes    Personal Goal #2 Re-Evaluation   Personal Goal #2   Yes eating more fruits     Goal Progress Seen   Yes     Personal Goal #3 Re-Evaluation   Goal Progress Seen   Yes        Psychosocial: Target Goals: Acknowledge presence or absence of depression, maximize coping skills, provide positive support system. Participant is able to verbalize types and ability to use techniques and skills needed for reducing stress and depression.  Initial Review & Psychosocial Screening:     Initial Psych Review & Screening - 12/29/14 [place]? Yes   Barriers   Psychosocial barriers to participate in program There are no identifiable barriers or psychosocial needs.   Screening Interventions   Interventions Encouraged to exercise      Quality of Life Scores:     Quality of Life - 03/11/15 1458    Quality of Life Scores   Health/Function Post 28.71 %   Health/Function % Change 8.1 %   Socioeconomic Post 28.21 %   Socioeconomic % Change 0 %   Psych/Spiritual Post 28.29 %   Psych/Spiritual % Change -2.9 %   Family Post 30 %   Family % Change 0 %   GLOBAL Post 28.63 %   GLOBAL % Change 2.5 %      PHQ-9:     Recent Review Flowsheet Data    Depression screen [facility] 2/9 03/11/2015 12/29/2014   Decreased Interest 0 0   Down, Depressed, Hopeless 0 0  PHQ - 2 Score 0 0   Altered sleeping 0 0   Tired, decreased energy 0 1   Change in appetite 1 2   Feeling bad or failure about yourself  0 0   Trouble concentrating 0 0   Moving slowly or fidgety/restless 0 0   Suicidal thoughts 0 0    PHQ-9 Score 1 3   Difficult doing work/chores Not difficult at all Not difficult at all      Psychosocial Evaluation and Intervention:     Psychosocial Evaluation - 01/21/15 1703    Psychosocial Evaluation & Interventions   Interventions Stress management education;Relaxation education;Encouraged to exercise with the program and follow exercise prescription   Comments Counselor met with Ms. Deshong today for initial psychosocial evaluation.  She is a 74 year old who has a strong support system and is actively involved in her faith community.  She denies a history of depression or current symptoms and states that her mood is generally positive most of the time.  Ms. Strollo states that her appetite is "too good" currently and has met with the dietician to begin working on changing this and losing some weight as one of her goals for the program.  She also reports that she has not been sleeping well the past two weeks with approx. 4 hours per night.  Counselor recommended speaking with Dr. or pharmacist about a natural OTC sleep aid that is sustained release since she reports no problem getting to sleep, only staying asleep.  Counselor will follow up with Ms. Gieger about this in the near future.     Continued Psychosocial Services Needed Yes  Ms. Coonrod will benefit from the psychoeducational components of this program, consistent exercise and improved sleep, which is being addressed.       Psychosocial Re-Evaluation:   Vocational Rehabilitation: Provide vocational rehab assistance to qualifying candidates.   Vocational Rehab Evaluation & Intervention:     Vocational Rehab - 12/29/14 1423    Initial Vocational Rehab Evaluation & Intervention   Assessment shows need for Vocational Rehabilitation No      Education: Education Goals: Education classes will be provided on a weekly basis, covering required topics. Participant will state understanding/return demonstration of topics  presented.  Learning Barriers/Preferences:     Learning Barriers/Preferences - 12/29/14 1423    Learning Barriers/Preferences   Learning Barriers None;Reading      Education Topics: General Nutrition Guidelines/Fats and Fiber: -Group instruction provided by verbal, written material, models and posters to present the general guidelines for heart healthy nutrition. Gives an explanation and review of dietary fats and fiber.          Cardiac Rehab from 03/23/2015 in [facility]   Date  03/23/15   Educator  PI   Instruction Review Code  2- meets goals/outcomes      Controlling Sodium/Reading Food Labels: -Group verbal and written material supporting the discussion of sodium use in heart healthy nutrition. Review and explanation with models, verbal and written materials for utilization of the food label.   Exercise Physiology & Risk Factors: - Group verbal and written instruction with models to review the exercise physiology of the cardiovascular system and associated critical values. Details cardiovascular disease risk factors and the goals associated with each risk factor.      Cardiac Rehab from 03/23/2015 in [facility] Cardiac Rehab   Date  02/18/15   Educator  R.M.    Instruction Review Code  2- meets goals/outcomes  Aerobic Exercise & Resistance Training: - Gives group verbal and written discussion on the health impact of inactivity. On the components of aerobic and resistive training programs and the benefits of this training and how to safely progress through these programs.      Cardiac Rehab from 03/23/2015 in [facility] Cardiac Rehab   Date  02/23/15   Educator  SW   Instruction Review Code  2- meets goals/outcomes      Flexibility, Balance, General Exercise Guidelines: - Provides group verbal and written instruction on the benefits of flexibility and balance training programs. Provides general exercise guidelines with specific guidelines to those with heart or lung  disease. Demonstration and skill practice provided.      Cardiac Rehab from 03/23/2015 in [facility] Cardiac Rehab   Date  03/02/15   Educator  KM   Instruction Review Code  2- meets goals/outcomes      Stress Management: - Provides group verbal and written instruction about the health risks of elevated stress, cause of high stress, and healthy ways to reduce stress.      Cardiac Rehab from 03/23/2015 in [facility] Cardiac Rehab   Date  03/04/15   Educator  [facility]   Instruction Review Code  2- meets goals/outcomes      Depression: - Provides group verbal and written instruction on the correlation between heart/lung disease and depressed mood, treatment options, and the stigmas associated with seeking treatment.   Anatomy & Physiology of the Heart: - Group verbal and written instruction and models provide basic cardiac anatomy and physiology, with the coronary electrical and arterial systems. Review of: AMI, Angina, Valve disease, Heart Failure, Cardiac Arrhythmia, Pacemakers, and the ICD.      Cardiac Rehab from 03/23/2015 in [facility] Cardiac Rehab   Date  01/12/15   Educator  SB   Instruction Review Code  2- meets goals/outcomes      Cardiac Procedures: - Group verbal and written instruction and models to describe the testing methods done to diagnose heart disease. Reviews the outcomes of the test results. Describes the treatment choices: Medical Management, Angioplasty, or Coronary Bypass Surgery.      Cardiac Rehab from 03/23/2015 in [facility] Cardiac Rehab   Date  02/11/15   Educator  C. Sevastian Witczak, RN   Instruction Review Code  2- meets goals/outcomes      Cardiac Medications: - Group verbal and written instruction to review commonly prescribed medications for heart disease. Reviews the medication, class of the drug, and side effects. Includes the steps to properly store meds and maintain the prescription regimen.      Cardiac Rehab from 03/23/2015 in [facility] Cardiac Rehab   Date  02/16/15   Educator   SB   Instruction Review Code  2- meets goals/outcomes      Go Sex-Intimacy & Heart Disease, Get SMART - Goal Setting: - Group verbal and written instruction through game format to discuss heart disease and the return to sexual intimacy. Provides group verbal and written material to discuss and apply goal setting through the application of the S.M.A.R.T. Method.      Cardiac Rehab from 03/23/2015 in [facility] Cardiac Rehab   Date  02/11/15   Educator  C. Eloni Darius, RN   Instruction Review Code  2- meets goals/outcomes      Other Matters of the Heart: - Provides group verbal, written materials and models to describe Heart Failure, Angina, Valve Disease, and Diabetes in the realm of heart disease. Includes description of the disease process and  treatment options available to the cardiac patient.      Cardiac Rehab from 03/23/2015 in [facility] Cardiac Rehab   Date  03/18/15   Educator  DW   Instruction Review Code  2- meets goals/outcomes      Exercise & Equipment Safety: - Individual verbal instruction and demonstration of equipment use and safety with use of the equipment.      Cardiac Rehab from 03/23/2015 in [facility] Cardiac Rehab   Date  12/29/14   Educator  C.EnterkinRN   Instruction Review Code  2- meets goals/outcomes      Infection Prevention: - Provides verbal and written material to individual with discussion of infection control including proper hand washing and proper equipment cleaning during exercise session.      Cardiac Rehab from 03/23/2015 in [facility] Cardiac Rehab   Date  12/29/14   Educator  C.EnterkinRN   Instruction Review Code  2- meets goals/outcomes      Falls Prevention: - Provides verbal and written material to individual with discussion of falls prevention and safety.      Cardiac Rehab from 03/23/2015 in [facility] Cardiac Rehab   Date  12/29/14   Educator  C. Newton   Instruction Review Code  2- meets goals/outcomes      Diabetes: - Individual verbal and written  instruction to review signs/symptoms of diabetes, desired ranges of glucose level fasting, after meals and with exercise. Advice that pre and post exercise glucose checks will be done for 3 sessions at entry of program.    Knowledge Questionnaire Score:     Knowledge Questionnaire Score - 03/11/15 1501    Knowledge Questionnaire Score   Post Score 28/28      Personal Goals and Risk Factors at Admission:     Personal Goals and Risk Factors at Admission - 12/29/14 1445    Personal Goals and Risk Factors on Admission    Weight Management Yes   Intervention Learn and follow the exercise and diet guidelines while in the program. Utilize the nutrition and education classes to help gain knowledge of the diet and exercise expectations in the program   Diabetes No   Hypertension Yes   Goal Participant will see blood pressure controlled within the values of 140/40m/Hg or within value directed by their physician.   Intervention Provide nutrition & aerobic exercise along with prescribed medications to achieve BP 140/90 or less.   Lipids Yes   Goal Cholesterol controlled with medications as prescribed, with individualized exercise RX and with personalized nutrition plan. Value goals: LDL < 761m HDL > 4060mParticipant states understanding of desired cholesterol values and following prescriptions.   Intervention Provide nutrition & aerobic exercise along with prescribed medications to achieve LDL <41m45mDL >40mg84m   Personal Goals and Risk Factors Review:      Goals and Risk Factor Review      01/15/15 1720 02/11/15 1730 03/18/15 1835       Increase Aerobic Exercise and Physical Activity   Goals Progress/Improvement seen  Yes Yes Yes        Personal Goals Discharge:      Personal Goals at Discharge - 03/25/15 1125    Increase Aerobic Exercise and Physical Activity   Goals Progress/Improvement seen  Yes       Comments: Discharged since completed 36/36 session on 03/23/2015.  Did well in Cardiac rehab.

## 2015-03-25 NOTE — Progress Notes (Signed)
Discharge Summary  Patient Details  Name: Cassidy Hernandez MRN: [id number] Date of Birth: 1940/11/06 Referring Provider:  Rusty Aus, MD   Number of Visits: 36  Reason for Discharge:  Patient reached a stable level of exercise. Patient independent in their exercise.  Smoking History:  History  Smoking status  . Never Smoker   Smokeless tobacco  . Not on file    Diagnosis:  Stable angina  ADL UCSD:   Initial Exercise Prescription:     Initial Exercise Prescription - 12/29/14 1500    Date of Initial Exercise Prescription   Date 12/29/14   Treadmill   MPH 2.2   Grade 0   Minutes 10   Bike   Level 0.4   Watts 12   Minutes 10   Recumbant Bike   Level 3   RPM 40   Watts 25   Minutes 15   NuStep   Level 3   Watts 40   Minutes 15   Arm Ergometer   Level 1   Watts 8   Minutes 10   Arm/Foot Ergometer   Level 4   Watts 12   Minutes 10   Cybex   Level 2   RPM 50   Minutes 10   Recumbant Elliptical   Level 1   Watts 10   Minutes 10   Elliptical   Level 1   Speed 3   Minutes 1   REL-XR   Level 2   Watts 30   Minutes 15   Prescription Details   Frequency (times per week) 3   Duration Progress to 30 minutes of continuous aerobic without signs/symptoms of physical distress   Intensity   THRR REST +  --  10-15 contractions below ischemic threshold    Ratings of Perceived Exertion 11-15   Progression Continue progressive overload as per policy without signs/symptoms or physical distress.   Resistance Training   Training Prescription Yes   Weight 2   Reps 10-12      Discharge Exercise Prescription (Final Exercise Prescription Changes):     Exercise Prescription Changes - 03/18/15 1700    Exercise Review   Progression Yes   Response to Exercise   Blood Pressure (Admit) 140/72 mmHg   Blood Pressure (Exercise) 144/74 mmHg   Blood Pressure (Exit) 126/68 mmHg   Heart Rate (Admit) 81 bpm   Heart Rate (Exercise) 100 bpm   Heart Rate (Exit) 68  bpm   Rating of Perceived Exertion (Exercise) 12   Symptoms No   Frequency Add 1 additional day to program exercise sessions.   Duration Progress to 30 minutes of continuous aerobic without signs/symptoms of physical distress   Intensity --  REST + 20; 10-15 beats below ischemic threshold   Progression Continue progressive overload as per policy without signs/symptoms or physical distress.   Resistance Training   Training Prescription Yes   Weight 3   Reps 10-15   Interval Training   Interval Training Yes   Equipment NuStep   Comments NuStep L 4-5/40-60W   Treadmill   MPH 2.5   Grade 0   NuStep   Level 5   Watts 60   Minutes 20   Home Exercise Plan   Plans to continue exercise at [facility] (comment)  [employer]      Functional Capacity:     6 Minute Walk      12/29/14 1510 03/18/15 1709     6 Minute Walk  Phase Initial Discharge    Distance 1180 feet 1420 feet    Distance % Change  20 %    Walk Time 6 minutes 6 minutes    Resting HR 65 bpm 69 bpm    Resting BP 142/78 mmHg 120/62 mmHg    Max Ex. HR 95 bpm 92 bpm    Max Ex. BP 158/68 mmHg 144/62 mmHg    RPE 11 12    Symptoms No No       Psychological, QOL, Others - Outcomes: PHQ 2/9: Depression screen [facility] 2/9 03/11/2015 12/29/2014  Decreased Interest 0 0  Down, Depressed, Hopeless 0 0  PHQ - 2 Score 0 0  Altered sleeping 0 0  Tired, decreased energy 0 1  Change in appetite 1 2  Feeling bad or failure about yourself  0 0  Trouble concentrating 0 0  Moving slowly or fidgety/restless 0 0  Suicidal thoughts 0 0  PHQ-9 Score 1 3  Difficult doing work/chores Not difficult at all Not difficult at all    Quality of Life:     Quality of Life - 03/11/15 1458    Quality of Life Scores   Health/Function Post 28.71 %   Health/Function % Change 8.1 %   Socioeconomic Post 28.21 %   Socioeconomic % Change 0 %   Psych/Spiritual Post 28.29 %   Psych/Spiritual % Change -2.9 %   Family Post 30  %   Family % Change 0 %   GLOBAL Post 28.63 %   GLOBAL % Change 2.5 %      Personal Goals: Goals established at orientation with interventions provided to work toward goal.     Personal Goals and Risk Factors at Admission - 12/29/14 1445    Personal Goals and Risk Factors on Admission    Weight Management Yes   Intervention Learn and follow the exercise and diet guidelines while in the program. Utilize the nutrition and education classes to help gain knowledge of the diet and exercise expectations in the program   Diabetes No   Hypertension Yes   Goal Participant will see blood pressure controlled within the values of 140/10mm/Hg or within value directed by their physician.   Intervention Provide nutrition & aerobic exercise along with prescribed medications to achieve BP 140/90 or less.   Lipids Yes   Goal Cholesterol controlled with medications as prescribed, with individualized exercise RX and with personalized nutrition plan. Value goals: LDL < 70mg , HDL > 40mg . Participant states understanding of desired cholesterol values and following prescriptions.   Intervention Provide nutrition & aerobic exercise along with prescribed medications to achieve LDL 70mg , HDL >40mg .       Personal Goals Discharge:     Personal Goals at Discharge - 03/25/15 1125    Increase Aerobic Exercise and Physical Activity   Goals Progress/Improvement seen  Yes      Nutrition & Weight - Outcomes:     Pre Biometrics - 12/29/14 1511    Pre Biometrics   Height 5' 1.1" (1.552 m)   Weight 182 lb 4.8 oz (82.691 kg)   Waist Circumference 40.25 inches   Hip Circumference 45.5 inches   Waist to Hip Ratio 0.88 %   BMI (Calculated) 34.4         Post Biometrics - 03/18/15 1708     Post  Biometrics   Height 5' 1.1" (1.552 m)   Weight 182 lb 9.6 oz (82.827 kg)   Waist Circumference 39.25 inches   Hip Circumference  44.5 inches   Waist to Hip Ratio 0.88 %   BMI (Calculated) 34.5       Nutrition:     Nutrition Therapy & Goals - 01/21/15 1609    Nutrition Therapy   Diet DASH diet guidelines   Fruits and Vegetables 5 servings/day   Personal Nutrition Goals   Personal Goal #1 Try a sugar-free mix-in flavoring for water, or Propel flavored water to make sure you take in enough fluid.   Personal Goal #2 Choose fruit or carrots for at least one snack per day.   Personal Goal #3 Try yogurt and frozen fruit in place of ice cream, or sunchips or unsalted pretzels in place of potato chips.   Comments Patient reports some difficulty with stress eating and eats out frequently since husband's death 2 years ago.      Nutrition Discharge:     Rate Your Plate - 97/02/63 7858    Rate Your Plate Scores   Post Score 63   Post Score % 70 %      Education Questionnaire Score:     Knowledge Questionnaire Score - 03/11/15 1501    Knowledge Questionnaire Score   Post Score 28/28      Goals reviewed with patient; copy given to patient.

## 2015-05-26 ENCOUNTER — Other Ambulatory Visit: Payer: Self-pay | Admitting: Infectious Diseases

## 2015-05-26 DIAGNOSIS — R198 Other specified symptoms and signs involving the digestive system and abdomen: Secondary | ICD-10-CM

## 2015-05-26 DIAGNOSIS — R1011 Right upper quadrant pain: Secondary | ICD-10-CM

## 2015-05-26 DIAGNOSIS — R1032 Left lower quadrant pain: Secondary | ICD-10-CM

## 2015-06-02 ENCOUNTER — Ambulatory Visit
Admission: RE | Admit: 2015-06-02 | Discharge: 2015-06-02 | Disposition: A | Discharge status: Home / Self Care | Payer: Medicare Other | Source: Ambulatory Visit | Attending: Infectious Diseases | Admitting: Infectious Diseases

## 2015-06-02 DIAGNOSIS — K5730 Diverticulosis of large intestine without perforation or abscess without bleeding: Secondary | ICD-10-CM | POA: Insufficient documentation

## 2015-06-02 DIAGNOSIS — N281 Cyst of kidney, acquired: Secondary | ICD-10-CM

## 2015-06-02 DIAGNOSIS — I2510 Atherosclerotic heart disease of native coronary artery without angina pectoris: Secondary | ICD-10-CM | POA: Insufficient documentation

## 2015-06-02 DIAGNOSIS — R1011 Right upper quadrant pain: Secondary | ICD-10-CM | POA: Diagnosis present

## 2015-06-02 DIAGNOSIS — R1032 Left lower quadrant pain: Secondary | ICD-10-CM | POA: Diagnosis present

## 2015-06-02 DIAGNOSIS — R198 Other specified symptoms and signs involving the digestive system and abdomen: Secondary | ICD-10-CM

## 2015-06-02 HISTORY — DX: Cyst of kidney, acquired: N28.1

## 2015-06-02 MED ORDER — IOHEXOL 300 MG/ML  SOLN
100.0000 mL | Freq: Once | INTRAMUSCULAR | Status: AC | PRN
  Administered 2015-06-02: 100 mL via INTRAVENOUS

## 2015-06-03 ENCOUNTER — Other Ambulatory Visit: Payer: Self-pay | Admitting: Infectious Diseases

## 2015-06-03 DIAGNOSIS — N2889 Other specified disorders of kidney and ureter: Secondary | ICD-10-CM

## 2015-06-16 ENCOUNTER — Ambulatory Visit
Admission: RE | Admit: 2015-06-16 | Discharge: 2015-06-16 | Disposition: A | Discharge status: Home / Self Care | Payer: Medicare Other | Source: Ambulatory Visit | Attending: Infectious Diseases | Admitting: Infectious Diseases

## 2015-06-16 DIAGNOSIS — R935 Abnormal findings on diagnostic imaging of other abdominal regions, including retroperitoneum: Secondary | ICD-10-CM | POA: Diagnosis present

## 2015-06-16 DIAGNOSIS — N2889 Other specified disorders of kidney and ureter: Secondary | ICD-10-CM | POA: Diagnosis not present

## 2015-06-16 MED ORDER — GADOBENATE DIMEGLUMINE 529 MG/ML IV SOLN
20.0000 mL | Freq: Once | INTRAVENOUS | Status: AC | PRN
  Administered 2015-06-16: 20 mL via INTRAVENOUS

## 2015-09-11 ENCOUNTER — Other Ambulatory Visit: Payer: Self-pay | Admitting: Infectious Diseases

## 2015-09-11 DIAGNOSIS — Z1231 Encounter for screening mammogram for malignant neoplasm of breast: Secondary | ICD-10-CM

## 2015-11-19 ENCOUNTER — Other Ambulatory Visit: Payer: Self-pay | Admitting: Infectious Diseases

## 2015-11-19 ENCOUNTER — Ambulatory Visit
Admission: RE | Admit: 2015-11-19 | Discharge: 2015-11-19 | Disposition: A | Discharge status: Home / Self Care | Payer: Medicare Other | Source: Ambulatory Visit | Attending: Infectious Diseases | Admitting: Infectious Diseases

## 2015-11-19 DIAGNOSIS — Z1231 Encounter for screening mammogram for malignant neoplasm of breast: Secondary | ICD-10-CM | POA: Insufficient documentation

## 2015-11-19 DIAGNOSIS — Z9882 Breast implant status: Secondary | ICD-10-CM | POA: Diagnosis not present

## 2015-11-23 DIAGNOSIS — K760 Fatty (change of) liver, not elsewhere classified: Secondary | ICD-10-CM | POA: Insufficient documentation

## 2016-02-11 ENCOUNTER — Encounter
Admission: RE | Disposition: A | Discharge status: Home / Self Care | Payer: Self-pay | Source: Ambulatory Visit | Attending: Unknown Physician Specialty

## 2016-02-11 ENCOUNTER — Ambulatory Visit: Payer: Medicare Other | Admitting: Anesthesiology

## 2016-02-11 ENCOUNTER — Encounter: Payer: Self-pay | Admitting: *Deleted

## 2016-02-11 ENCOUNTER — Ambulatory Visit
Admission: RE | Admit: 2016-02-11 | Discharge: 2016-02-11 | Disposition: A | Discharge status: Home / Self Care | Payer: Medicare Other | Source: Ambulatory Visit | Attending: Unknown Physician Specialty | Admitting: Unknown Physician Specialty

## 2016-02-11 DIAGNOSIS — K219 Gastro-esophageal reflux disease without esophagitis: Secondary | ICD-10-CM | POA: Insufficient documentation

## 2016-02-11 DIAGNOSIS — D120 Benign neoplasm of cecum: Secondary | ICD-10-CM | POA: Insufficient documentation

## 2016-02-11 DIAGNOSIS — Z9049 Acquired absence of other specified parts of digestive tract: Secondary | ICD-10-CM | POA: Insufficient documentation

## 2016-02-11 DIAGNOSIS — Z79899 Other long term (current) drug therapy: Secondary | ICD-10-CM | POA: Diagnosis not present

## 2016-02-11 DIAGNOSIS — Z90710 Acquired absence of both cervix and uterus: Secondary | ICD-10-CM | POA: Diagnosis not present

## 2016-02-11 DIAGNOSIS — Z1211 Encounter for screening for malignant neoplasm of colon: Secondary | ICD-10-CM | POA: Insufficient documentation

## 2016-02-11 DIAGNOSIS — Z7982 Long term (current) use of aspirin: Secondary | ICD-10-CM | POA: Diagnosis not present

## 2016-02-11 DIAGNOSIS — I10 Essential (primary) hypertension: Secondary | ICD-10-CM | POA: Insufficient documentation

## 2016-02-11 DIAGNOSIS — K5730 Diverticulosis of large intestine without perforation or abscess without bleeding: Secondary | ICD-10-CM | POA: Insufficient documentation

## 2016-02-11 DIAGNOSIS — D125 Benign neoplasm of sigmoid colon: Secondary | ICD-10-CM | POA: Diagnosis not present

## 2016-02-11 DIAGNOSIS — Z8249 Family history of ischemic heart disease and other diseases of the circulatory system: Secondary | ICD-10-CM | POA: Insufficient documentation

## 2016-02-11 DIAGNOSIS — Z87442 Personal history of urinary calculi: Secondary | ICD-10-CM | POA: Insufficient documentation

## 2016-02-11 DIAGNOSIS — K640 First degree hemorrhoids: Secondary | ICD-10-CM | POA: Insufficient documentation

## 2016-02-11 DIAGNOSIS — Z882 Allergy status to sulfonamides status: Secondary | ICD-10-CM | POA: Insufficient documentation

## 2016-02-11 HISTORY — PX: COLONOSCOPY: SHX5424

## 2016-02-11 SURGERY — COLONOSCOPY
Anesthesia: General

## 2016-02-11 MED ORDER — FENTANYL CITRATE (PF) 100 MCG/2ML IJ SOLN
INTRAMUSCULAR | Status: DC | PRN
  Administered 2016-02-11: 50 ug via INTRAVENOUS

## 2016-02-11 MED ORDER — SODIUM CHLORIDE 0.9 % IV SOLN: INTRAVENOUS | Status: DC

## 2016-02-11 MED ORDER — PHENYLEPHRINE HCL 10 MG/ML IJ SOLN
INTRAMUSCULAR | Status: DC | PRN
  Administered 2016-02-11 (×3): 100 ug via INTRAVENOUS

## 2016-02-11 MED ORDER — SODIUM CHLORIDE 0.9 % IV SOLN
INTRAVENOUS | Status: DC
  Administered 2016-02-11: 13:00:00 via INTRAVENOUS
  Administered 2016-02-11: 1000 mL via INTRAVENOUS

## 2016-02-11 MED ORDER — LIDOCAINE 2% (20 MG/ML) 5 ML SYRINGE
INTRAMUSCULAR | Status: DC | PRN
Start: 2016-02-11 — End: 2016-02-11
  Administered 2016-02-11: 40 mg via INTRAVENOUS

## 2016-02-11 MED ORDER — PROPOFOL 500 MG/50ML IV EMUL
INTRAVENOUS | Status: DC | PRN
  Administered 2016-02-11: 160 ug/kg/min via INTRAVENOUS

## 2016-02-11 MED ORDER — MIDAZOLAM HCL 5 MG/5ML IJ SOLN
INTRAMUSCULAR | Status: DC | PRN
  Administered 2016-02-11: 1 mg via INTRAVENOUS

## 2016-02-11 MED ORDER — PROPOFOL 10 MG/ML IV BOLUS
INTRAVENOUS | Status: DC | PRN
  Administered 2016-02-11: 100 mg via INTRAVENOUS

## 2016-02-11 NOTE — Transfer of Care (Signed)
Immediate Anesthesia Transfer of Care Note  Patient: Cassidy Hernandez  Procedure(s) Performed: Procedure(s): COLONOSCOPY (N/A)  Patient Location: PACU and Endoscopy Unit  Anesthesia Type:General  Level of Consciousness: awake and patient cooperative  Airway & Oxygen Therapy: Patient Spontanous Breathing and Patient connected to nasal cannula oxygen  Post-op Assessment: Report given to RN and Post -op Vital signs reviewed and stable  Post vital signs: stable  Last Vitals:  Filed Vitals:   02/11/16 1237  BP: 146/81  Pulse: 68  Temp: 36.9 C  Resp: 19    Last Pain: There were no vitals filed for this visit.       Complications: No apparent anesthesia complications

## 2016-02-11 NOTE — Anesthesia Preprocedure Evaluation (Signed)
Anesthesia Evaluation  Patient identified by MRN, date of birth, ID band Patient awake    Reviewed: Allergy & Precautions, H&P , NPO status , Patient's Chart, lab work & pertinent test results  History of Anesthesia Complications Negative for: history of anesthetic complications  Airway Mallampati: III  TM Distance: <3 FB Neck ROM: limited    Dental  (+) Poor Dentition   Pulmonary neg pulmonary ROS, neg shortness of breath,    Pulmonary exam normal breath sounds clear to auscultation       Cardiovascular Exercise Tolerance: Good hypertension, (-) angina+ Peripheral Vascular Disease  (-) Past MI and (-) DOE Normal cardiovascular exam Rhythm:regular Rate:Normal     Neuro/Psych negative neurological ROS  negative psych ROS   GI/Hepatic Neg liver ROS, GERD  Controlled,  Endo/Other  negative endocrine ROS  Renal/GU Renal disease  negative genitourinary   Musculoskeletal   Abdominal   Peds  Hematology negative hematology ROS (+)   Anesthesia Other Findings Past Medical History:   Hypertension                                                 Kidney stone                                                 Subclavian steal syndrome                                    Carotid artery stenosis                         60% right*   Degenerative arthritis                                       GERD (gastroesophageal reflux disease)                       H/O: hysterectomy                                           Past Surgical History:   CHOLECYSTECTOMY                                               ABDOMINAL HYSTERECTOMY                                        CARDIAC CATHETERIZATION                          12/24/2014      Comment:60% RCA large Proximal   AUGMENTATION MAMMAPLASTY  Bilateral 1980        BMI    Body Mass Index   31.89 kg/m 2    Signs and symptoms suggestive of sleep apnea    Reproductive/Obstetrics negative OB ROS                             Anesthesia Physical Anesthesia Plan  ASA: III  Anesthesia Plan: General   Post-op Pain Management:    Induction:   Airway Management Planned:   Additional Equipment:   Intra-op Plan:   Post-operative Plan:   Informed Consent: I have reviewed the patients History and Physical, chart, labs and discussed the procedure including the risks, benefits and alternatives for the proposed anesthesia with the patient or authorized representative who has indicated his/her understanding and acceptance.   Dental Advisory Given  Plan Discussed with: Anesthesiologist, CRNA and Surgeon  Anesthesia Plan Comments:         Anesthesia Quick Evaluation

## 2016-02-11 NOTE — Op Note (Signed)
[facility] Gastroenterology Patient Name: Cassidy Hernandez Procedure Date: 02/11/2016 1:49 PM MRN: [id number] Account #: [id number] Date of Birth: 1940/12/30 Admit Type: Outpatient Age: 75 Room: [facility] ENDO ROOM 1 Gender: Female Note Status: Finalized Procedure:            Colonoscopy Indications:          Screening for colorectal malignant neoplasm Providers:            Manya Silvas, MD Referring MD:         Adrian Prows (Referring MD) Medicines:            Propofol per Anesthesia Complications:        No immediate complications. Procedure:            Pre-Anesthesia Assessment:                       - After reviewing the risks and benefits, the patient                        was deemed in satisfactory condition to undergo the                        procedure.                       After obtaining informed consent, the colonoscope was                        passed under direct vision. Throughout the procedure,                        the patient's blood pressure, pulse, and oxygen                        saturations were monitored continuously. The                        Colonoscope was introduced through the anus and                        advanced to the the cecum, identified by appendiceal                        orifice and ileocecal valve. The colonoscopy was                        performed with difficulty due to restricted mobility of                        the colon and a tortuous colon. Successful completion                        of the procedure was aided by withdrawing the scope and                        replacing with the pediatric endoscope. The patient                        tolerated the procedure well. The quality of the bowel  preparation was good. Findings:      The colonoscope could not be passed beyond mid sigmoid, swithched to an       upper scope and got beyond there to likely mid transverse colon. Then       examined  the colon and removed the sigmoid polyp. Then I repearted an       attempt to pass the colon scope and was successful to the cecum where       the cecal polyp was removed.      A small polyp was found in the sigmoid colon. The polyp was sessile. The       polyp was removed with a hot snare. Resection and retrieval were       complete.      A small polyp was found in the cecum. The polyp was sessile. The polyp       was removed with a hot snare. Resection and retrieval were complete.      A few small-mouthed diverticula were found in the sigmoid colon.      Internal hemorrhoids were found during endoscopy. The hemorrhoids were       small and Grade I (internal hemorrhoids that do not prolapse). Impression:           - One small polyp in the sigmoid colon, removed with a                        hot snare. Resected and retrieved.                       - One small polyp in the cecum, removed with a hot                        snare. Resected and retrieved.                       - Diverticulosis in the sigmoid colon.                       - Internal hemorrhoids. Recommendation:       - Await pathology results. Manya Silvas, MD 02/11/2016 2:55:18 PM This report has been signed electronically. Number of Addenda: 0 Note Initiated On: 02/11/2016 1:49 PM Scope Withdrawal Time: 0 hours 7 minutes 57 seconds  Total Procedure Duration: 0 hours 31 minutes 15 seconds       [facility]

## 2016-02-11 NOTE — H&P (Signed)
Primary Care Physician:  Leonel Ramsay, MD Primary Gastroenterologist:  Dr. Vira Agar  Pre-Procedure History & Physical: HPI:  Cassidy Hernandez is a 75 y.o. female is here for an colonoscopy.   Past Medical History  Diagnosis Date  . Hypertension   . Kidney stone   . Subclavian steal syndrome   . 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 Degenerative arthritis   . GERD (gastroesophageal reflux disease)   . H/O: hysterectomy     Past Surgical History  Procedure Laterality Date  . Cholecystectomy    . Abdominal hysterectomy    . Cardiac catheterization  12/24/2014    60% RCA large Proximal  . Augmentation mammaplasty Bilateral 1980    Prior to Admission medications   Medication Sig Start Date End Date Taking? Authorizing Provider  amLODipine (NORVASC) 5 MG tablet Take by mouth. 12/04/14  Yes Historical Provider, MD  aspirin EC 81 MG tablet Take by mouth.   Yes Historical Provider, MD  atenolol (TENORMIN) 50 MG tablet Take by mouth. 09/26/14  Yes Historical Provider, MD  chlorthalidone (HYGROTON) 25 MG tablet TAKE 1/2 TABLET EVERY DAY 09/23/14  Yes Historical Provider, MD  losartan (COZAAR) 100 MG tablet Take by mouth.   Yes Historical Provider, MD  Multiple Vitamin (MULTIVITAMIN) capsule Take by mouth.   Yes Historical Provider, MD  nitroGLYCERIN (NITROSTAT) 0.4 MG SL tablet Place under the tongue. 05/26/14  Yes Historical Provider, MD  omeprazole (PRILOSEC) 20 MG capsule Take by mouth.   Yes Historical Provider, MD  rosuvastatin (CRESTOR) 20 MG tablet Take by mouth. 12/01/14  Yes Historical Provider, MD  zolpidem (AMBIEN) 10 MG tablet Take by mouth.   Yes Historical Provider, MD    Allergies as of 01/15/2016 - Review Complete 06/02/2015  Allergen Reaction Noted  . Sulfa antibiotics Other (See Comments) 12/29/2014    Family History  Problem Relation Age of Onset  . Heart attack Mother   . Heart disease Mother   . Heart attack Father   . Heart disease Father   .  Breast cancer Neg Hx     Social History   Social History  . Marital Status: Married    Spouse Name: N/A  . Number of Children: N/A  . Years of Education: N/A   Occupational History  . Not on file.   Social History Main Topics  . Smoking status: Never Smoker   . Smokeless tobacco: Not on file  . Alcohol Use: Not on file  . Drug Use: Not on file  . Sexual Activity: Not on file   Other Topics Concern  . Not on file   Social History Narrative    Review of Systems: See HPI, otherwise negative ROS  Physical Exam: BP 146/81 mmHg  Pulse 68  Temp(Src) 98.5 F (36.9 C) (Tympanic)  Resp 19  Ht 5\' 3"  (1.6 m)  Wt 81.647 kg (180 lb)  BMI 31.89 kg/m2  SpO2 97% General:   Alert,  pleasant and cooperative in NAD Head:  Normocephalic and atraumatic. Neck:  Supple; no masses or thyromegaly. Lungs:  Clear throughout to auscultation.    Heart:  Regular rate and rhythm. Abdomen:  Soft, nontender and nondistended. Normal bowel sounds, without guarding, and without rebound.   Neurologic:  Alert and  oriented x4;  grossly normal neurologically.  Impression/Plan: Cassidy Hernandez is here for an colonoscopy to be performed for screening colonoscopy  Risks, benefits, limitations, and alternatives regarding  colonoscopy have been reviewed with the  patient.  Questions have been answered.  All parties agreeable.   Gaylyn Cheers, MD  02/11/2016, 1:49 PM

## 2016-02-12 ENCOUNTER — Encounter: Payer: Self-pay | Admitting: Unknown Physician Specialty

## 2016-02-15 LAB — SURGICAL PATHOLOGY

## 2016-02-15 NOTE — Anesthesia Postprocedure Evaluation (Signed)
Anesthesia Post Note  Patient: Cassidy Hernandez  Procedure(s) Performed: Procedure(s) (LRB): COLONOSCOPY (N/A)  Patient location during evaluation: PACU Anesthesia Type: General Level of consciousness: awake Pain management: pain level controlled Vital Signs Assessment: post-procedure vital signs reviewed and stable Respiratory status: spontaneous breathing Cardiovascular status: blood pressure returned to baseline Anesthetic complications: no    Last Vitals:  Filed Vitals:   02/11/16 1526 02/11/16 1536  BP: 87/62 105/82  Pulse: 58 64  Temp:    Resp: 17 16    Last Pain:  Filed Vitals:   02/12/16 0732  PainSc: 0-No pain                 VAN STAVEREN,Sheilyn Boehlke

## 2016-12-07 ENCOUNTER — Other Ambulatory Visit: Payer: Self-pay | Admitting: Infectious Diseases

## 2016-12-07 DIAGNOSIS — Z1231 Encounter for screening mammogram for malignant neoplasm of breast: Secondary | ICD-10-CM

## 2016-12-07 DIAGNOSIS — G8929 Other chronic pain: Secondary | ICD-10-CM | POA: Insufficient documentation

## 2016-12-26 ENCOUNTER — Ambulatory Visit: Payer: Medicare Other | Attending: Unknown Physician Specialty | Admitting: Speech Pathology

## 2016-12-26 ENCOUNTER — Encounter: Payer: Self-pay | Admitting: Speech Pathology

## 2016-12-26 DIAGNOSIS — R490 Dysphonia: Secondary | ICD-10-CM

## 2016-12-26 NOTE — Therapy (Signed)
[place] MAIN [facility] SERVICES [address] [place], [place], [address] Phone: [phone]   Fax:  [phone]  Speech Language Pathology Evaluation  Patient Details  Name: Cassidy Hernandez MRN: [id number] Date of Birth: 21-Oct-1940 Referring Provider: Dr. 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          SLP Evaluation [facility] - 12/26/16 0001      SLP Visit Information   SLP Received On 12/26/16   Referring Provider Dr. Tami Ribas   Onset Date 12/19/2016   Medical Diagnosis Dysphonia     Subjective   Subjective "My voice just shuts off"   Patient/Family Stated Goal "I just want to be able to talk"     General Information   HPI 76 year old woman referred by Dr. Tami Ribas for voice therapy.  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  The patient describes her voice symptoms as "my voice just shuts off"     Prior Functional Status   Cognitive/Linguistic Baseline Within functional limits     Oral  Motor/Sensory Function   Overall Oral Motor/Sensory Function Appears within functional limits for tasks assessed     Motor Speech   Overall Motor Speech Impaired   Respiration Impaired   Level of Impairment Conversation   Phonation Hoarse;Other (comment)  Glottal fry, low habitual pitch   Resonance Within functional limits   Articulation Within functional limitis   Intelligibility Intelligible   Phonation Impaired   Vocal Abuses Habitual Hyperphonia;Vocal Fold Dehydration   Tension Present Jaw;Neck;Shoulder   Volume Soft   Pitch Low     Standardized Assessments   Standardized Assessments  Other Assessment  Perceptual Voice Evaluation       Perceptual Voice Evaluation Voice checklist:  Health risks: GERD   Characteristic voice use: no longer sings   Environmental risks: no significant environmental risks  Misuse: poor breath support, low habitual pitch  Abuse: some coughing/throat clearing  Vocal characteristics: breathy, hoarse, limited voice range, poor vocal projection, excessive pharyngeal resonance Maximum phonation time for sustained ah: 7 seconds Average fundamental frequency during sustained ah: 213 Hz (1 STD below average for age and gender) Highest dynamic pitch when altering pitch from a low note to a high note: 416 Hz Lowest dynamic pitch when altering from a high note to a low note: 132 Hz Highest dynamic pitch in conversational speech: 393 Hz Lowest dynamic  pitch in conversational speech: 86 Hz Average time patient was able to sustain /s/: 3.7 seconds Average time patient was able to sustain /z/: 3 seconds s/z ratio : 1.2 Visi-Pitch: Multi-Dimensional Voice Program (MDVP)  MDVP extracts objective quantitative values (Relative Average Perturbation, Shimmer, Voice Turbulence Index, and Noise to Harmonic Ratio) on sustained phonation, which are displayed graphically and numerically in comparison to a built-in normative database.  The patient exhibited  values outside the norm for Relative Average Perturbation and Voice Turbulence Index.  Average fundamental frequency was 1 STD below the average for age and gender. Perceptually, her voice was muffled. Patient improved all parameters with trial Resonant Therapy techniques.      SLP Education - 12/26/16 1100    Education provided Yes   Education Details Paient instructed breath support exercises and resonant therapy techniques   Person(s) Educated Patient   Methods Explanation;Demonstration;Handout   Comprehension Verbalized understanding            SLP Long Term Goals - 12/26/16 1107      SLP LONG TERM GOAL #1   Title The patient will demonstrate independent understanding of vocal hygiene concepts and extrinsic laryngeal muscle stretches.     Time 8   Period Weeks   Status New     SLP LONG TERM GOAL #2   Title The patient will be independent for abdominal breathing and breath support exercises.   Time 8   Period Weeks   Status New     SLP LONG TERM GOAL #3   Title The patient will maximize voice quality and loudness using breath support/oral resonance for paragraph length recitation with 80% accuracy.   Time 8   Period Weeks   Status New          Plan - 12/26/16 1102    Clinical Impression Statement This 76 year old woman, with voice breaks, is presenting with moderate dysphonia.  The patient demonstrates hoarse/glottal vocal quality, excessive pharyngeal resonance, strained/tense phonation, limited pitch range, and laryngeal tension. She will benefit from voice therapy for education, to improve breath control, reduce laryngeal tension, and improve tone focus.  Patient was able to improve vocal quality and resonance using Resonant Voice Therapy techniques.   Speech Therapy Frequency 2x / week   Duration Other (comment)  8 weeks   Treatment/Interventions Patient/family education;SLP instruction and feedback;Other (comment)  Voice therapy   Potential to Achieve Goals  Good   Potential Considerations Ability to learn/carryover information;Co-morbidities;Cooperation/participation level;Medical prognosis;Pain level;Previous level of function;Severity of impairments;Family/community support   SLP Home Exercise Plan Breath support, resonant voice   Consulted and Agree with Plan of Care Patient      Patient will benefit from skilled therapeutic intervention in order to improve the following deficits and impairments:   Dysphonia - Plan: SLP plan of care cert/re-cert      G-Codes - 70/26/37 1106    Functional Assessment Tool Used Perceptual Voice Evaluation, clinical judgment   Functional Limitations Voice   Voice Current Status (G9171) At least 40 percent but less than 60 percent impaired, limited or restricted   Voice Goal Status (G9172) At least 1 percent but less than 20 percent impaired, limited or restricted      Problem List There are no active problems to display for this patient.  [name], MS/CCC- SLP  Lou Miner 12/26/2016, 11:11 AM  [place] MAIN [facility] SERVICES [address] [place], [place], [address] Phone: [phone]   Fax:  [phone]  Name: YESIKA RISPOLI MRN: [id number] Date of Birth: November 09, 1940

## 2016-12-28 ENCOUNTER — Ambulatory Visit
Admission: RE | Admit: 2016-12-28 | Discharge: 2016-12-28 | Disposition: A | Discharge status: Home / Self Care | Payer: Medicare Other | Source: Ambulatory Visit | Attending: Infectious Diseases | Admitting: Infectious Diseases

## 2016-12-28 DIAGNOSIS — Z1231 Encounter for screening mammogram for malignant neoplasm of breast: Secondary | ICD-10-CM | POA: Diagnosis present

## 2017-01-03 ENCOUNTER — Encounter: Payer: Self-pay | Admitting: Speech Pathology

## 2017-01-03 ENCOUNTER — Ambulatory Visit: Payer: Medicare Other | Admitting: Speech Pathology

## 2017-01-03 DIAGNOSIS — R490 Dysphonia: Secondary | ICD-10-CM

## 2017-01-03 NOTE — Therapy (Signed)
[place] MAIN [facility] SERVICES [address] [place], [place], [address] Phone: [phone]   Fax:  [phone]  Speech Language Pathology Treatment  Patient Details  Name: Cassidy Hernandez MRN: [id number] Date of Birth: 1940-09-22 Referring Provider: Dr. Tami Ribas  Encounter Date: 01/03/2017      End of Session - 01/03/17 1528    Visit Number 2   Number of Visits 17   Date for SLP Re-Evaluation 02/25/17   SLP Start Time 1400   SLP Stop Time  1450   SLP Time Calculation (min) 50 min   Activity Tolerance Patient tolerated treatment well      Past Medical History:  Diagnosis Date  . 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 Degenerative arthritis   . GERD (gastroesophageal reflux disease)   . H/O: hysterectomy   . Hypertension   . Kidney stone   . Subclavian steal syndrome     Past Surgical History:  Procedure Laterality Date  . ABDOMINAL HYSTERECTOMY    . AUGMENTATION MAMMAPLASTY Bilateral 1980  . 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 CHOLECYSTECTOMY    . COLONOSCOPY N/A 02/11/2016   Procedure: COLONOSCOPY;  Surgeon: Manya Silvas, MD;  Location: [facility] END[facility]COPY;  Service: Endoscopy;  Laterality: N/A;    There were no vitals filed for this visit.      Subjective Assessment - 01/03/17 1525    Subjective "My voice just gives out on me"   Currently in Pain? No/denies               ADULT SLP TREATMENT - 01/03/17 0001      General Information   Behavior/Cognition Alert;Cooperative;Pleasant mood   HPI 76 year old woman referred by Dr. Tami Ribas for voice therapy.  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  The patient describes her voice symptoms as "my voice just shuts off"     Treatment Provided   Treatment provided Cognitive-Linquistic     Pain Assessment   Pain Assessment No/denies pain     Cognitive-Linquistic Treatment   Treatment focused on  Voice   Skilled Treatment The patient was provided with written and verbal teaching regarding neck, shoulder, tongue, and throat stretches exercises to promote relaxed phonation. The patient was provided with written and verbal teaching regarding breath support exercises.  Patient instructed in relaxed phonation / oral resonance. Maintains oral resonance in hum and initial /m/ words with 70% accuracy.     Assessment / Recommendations / Plan   Plan Continue with current plan of care     Progression Toward Goals   Progression toward goals Progressing toward goals          SLP Education - 01/03/17 1527    Education provided Yes   Education Details Basic voice production   Person(s) Educated Patient   Methods Explanation   Comprehension Verbalized understanding            SLP Long Term Goals - 12/26/16 1107      SLP LONG TERM GOAL #1   Title The patient will demonstrate independent understanding of vocal hygiene concepts and extrinsic laryngeal muscle stretches.     Time 8   Period Weeks   Status New     SLP LONG TERM GOAL #2   Title The patient will be independent for abdominal breathing and breath support exercises.   Time 8   Period Weeks   Status New  SLP LONG TERM GOAL #3   Title The patient will maximize voice quality and loudness using breath support/oral resonance for paragraph length recitation with 80% accuracy.   Time 8   Period Weeks   Status New          Plan - 01/03/17 1531    Clinical Impression Statement  Patient able to improve vocal quality with nasality to improve oral resonance and loudness to decrease laryngeal strain.   Speech Therapy Frequency 2x / week   Duration Other (comment)   Treatment/Interventions Patient/family education;SLP instruction and feedback;Other (comment)  Voice therapy   Potential to Achieve Goals Good   Potential Considerations Ability to learn/carryover information;Co-morbidities;Cooperation/participation  level;Medical prognosis;Pain level;Previous level of function;Severity of impairments;Family/community support   SLP Home Exercise Plan Breath support, resonant voice   Consulted and Agree with Plan of Care Patient      Patient will benefit from skilled therapeutic intervention in order to improve the following deficits and impairments:   Dysphonia    Problem List There are no active problems to display for this patient.  [name], [place], Susie 01/03/2017, 3:32 PM  [place] MAIN [facility] SERVICES [address] [place], [place], [address] Phone: [phone]   Fax:  [phone]   Name: Cassidy Hernandez MRN: [id number] Date of Birth: [birth year]

## 2017-01-04 ENCOUNTER — Encounter: Payer: Self-pay | Admitting: Anesthesiology

## 2017-01-04 ENCOUNTER — Ambulatory Visit: Payer: Medicare Other | Attending: Anesthesiology | Admitting: Anesthesiology

## 2017-01-04 VITALS — BP 160/64 | HR 62 | Temp 98.2°F | Resp 18 | Ht 63.0 in | Wt 180.0 lb

## 2017-01-04 DIAGNOSIS — M5136 Other intervertebral disc degeneration, lumbar region: Secondary | ICD-10-CM

## 2017-01-04 DIAGNOSIS — Z7982 Long term (current) use of aspirin: Secondary | ICD-10-CM | POA: Diagnosis not present

## 2017-01-04 DIAGNOSIS — Z90710 Acquired absence of both cervix and uterus: Secondary | ICD-10-CM | POA: Diagnosis not present

## 2017-01-04 DIAGNOSIS — K219 Gastro-esophageal reflux disease without esophagitis: Secondary | ICD-10-CM | POA: Insufficient documentation

## 2017-01-04 DIAGNOSIS — G458 Other transient cerebral ischemic attacks and related syndromes: Secondary | ICD-10-CM | POA: Insufficient documentation

## 2017-01-04 DIAGNOSIS — M4696 Unspecified inflammatory spondylopathy, lumbar region: Secondary | ICD-10-CM

## 2017-01-04 DIAGNOSIS — M47816 Spondylosis without myelopathy or radiculopathy, lumbar region: Secondary | ICD-10-CM

## 2017-01-04 DIAGNOSIS — Z8249 Family history of ischemic heart disease and other diseases of the circulatory system: Secondary | ICD-10-CM | POA: Insufficient documentation

## 2017-01-04 DIAGNOSIS — G894 Chronic pain syndrome: Secondary | ICD-10-CM | POA: Insufficient documentation

## 2017-01-04 DIAGNOSIS — M5432 Sciatica, left side: Secondary | ICD-10-CM | POA: Diagnosis not present

## 2017-01-04 DIAGNOSIS — I10 Essential (primary) hypertension: Secondary | ICD-10-CM | POA: Diagnosis not present

## 2017-01-04 DIAGNOSIS — Z87442 Personal history of urinary calculi: Secondary | ICD-10-CM | POA: Insufficient documentation

## 2017-01-04 DIAGNOSIS — M545 Low back pain: Secondary | ICD-10-CM | POA: Diagnosis present

## 2017-01-04 DIAGNOSIS — I6529 Occlusion and stenosis of unspecified carotid artery: Secondary | ICD-10-CM | POA: Insufficient documentation

## 2017-01-04 NOTE — Progress Notes (Signed)
Safety precautions to be maintained throughout the outpatient stay will include: orient to surroundings, keep bed in low position, maintain call bell within reach at all times, provide assistance with transfer out of bed and ambulation.  

## 2017-01-04 NOTE — Patient Instructions (Signed)
Epidural Steroid Injection Patient Information  Description: The epidural space surrounds the nerves as they exit the spinal cord.  In some patients, the nerves can be compressed and inflamed by a bulging disc or a tight spinal canal (spinal stenosis).  By injecting steroids into the epidural space, we can bring irritated nerves into direct contact with a potentially helpful medication.  These steroids act directly on the irritated nerves and can reduce swelling and inflammation which often leads to decreased pain.  Epidural steroids may be injected anywhere along the spine and from the neck to the low back depending upon the location of your pain.   After numbing the skin with local anesthetic (like Novocaine), a small needle is passed into the epidural space slowly.  You may experience a sensation of pressure while this is being done.  The entire block usually last less than 10 minutes.  Conditions which may be treated by epidural steroids:   Low back and leg pain  Neck and arm pain  Spinal stenosis  Post-laminectomy syndrome  Herpes zoster (shingles) pain  Pain from compression fractures  Preparation for the injection:  1. Do not eat any solid food or dairy products within 8 hours of your appointment.  2. You may drink clear liquids up to 3 hours before appointment.  Clear liquids include water, black coffee, juice or soda.  No milk or cream please. 3. You may take your regular medication, including pain medications, with a sip of water before your appointment  Diabetics should hold regular insulin (if taken separately) and take 1/2 normal NPH dos the morning of the procedure.  Carry some sugar containing items with you to your appointment. 4. A driver must accompany you and be prepared to drive you home after your procedure.  5. Bring all your current medications with your. 6. An IV may be inserted and sedation may be given at the discretion of the physician.   7. A blood pressure  cuff, EKG and other monitors will often be applied during the procedure.  Some patients may need to have extra oxygen administered for a short period. 8. You will be asked to provide medical information, including your allergies, prior to the procedure.  We must know immediately if you are taking blood thinners (like Coumadin/Warfarin)  Or if you are allergic to IV iodine contrast (dye). We must know if you could possible be pregnant.  Possible side-effects:  Bleeding from needle site  Infection (rare, may require surgery)  Nerve injury (rare)  Numbness & tingling (temporary)  Difficulty urinating (rare, temporary)  Spinal headache ( a headache worse with upright posture)  Light -headedness (temporary)  Pain at injection site (several days)  Decreased blood pressure (temporary)  Weakness in arm/leg (temporary)  Pressure sensation in back/neck (temporary)  Call if you experience:  Fever/chills associated with headache or increased back/neck pain.  Headache worsened by an upright position.  New onset weakness or numbness of an extremity below the injection site  Hives or difficulty breathing (go to the emergency room)  Inflammation or drainage at the infection site  Severe back/neck pain  Any new symptoms which are concerning to you  Please note:  Although the local anesthetic injected can often make your back or neck feel good for several hours after the injection, the pain will likely return.  It takes 3-7 days for steroids to work in the epidural space.  You may not notice any pain relief for at least that one week.    If effective, we will often do a series of three injections spaced 3-6 weeks apart to maximally decrease your pain.  After the initial series, we generally will wait several months before considering a repeat injection of the same type.  If you have any questions, please call [phone] [facility] 

## 2017-01-05 ENCOUNTER — Other Ambulatory Visit: Payer: Self-pay | Admitting: Anesthesiology

## 2017-01-05 ENCOUNTER — Ambulatory Visit: Payer: Medicare Other | Admitting: Speech Pathology

## 2017-01-05 ENCOUNTER — Ambulatory Visit (HOSPITAL_BASED_OUTPATIENT_CLINIC_OR_DEPARTMENT_OTHER): Payer: Medicare Other | Admitting: Anesthesiology

## 2017-01-05 ENCOUNTER — Encounter: Payer: Self-pay | Admitting: Anesthesiology

## 2017-01-05 ENCOUNTER — Ambulatory Visit
Admission: RE | Admit: 2017-01-05 | Discharge: 2017-01-05 | Disposition: A | Discharge status: Home / Self Care | Payer: Medicare Other | Source: Ambulatory Visit | Attending: Anesthesiology | Admitting: Anesthesiology

## 2017-01-05 VITALS — BP 154/74 | HR 63 | Temp 98.0°F | Resp 18 | Ht 63.0 in | Wt 180.0 lb

## 2017-01-05 DIAGNOSIS — Z79899 Other long term (current) drug therapy: Secondary | ICD-10-CM | POA: Insufficient documentation

## 2017-01-05 DIAGNOSIS — M5432 Sciatica, left side: Secondary | ICD-10-CM | POA: Insufficient documentation

## 2017-01-05 DIAGNOSIS — K219 Gastro-esophageal reflux disease without esophagitis: Secondary | ICD-10-CM | POA: Diagnosis not present

## 2017-01-05 DIAGNOSIS — Z90710 Acquired absence of both cervix and uterus: Secondary | ICD-10-CM | POA: Diagnosis not present

## 2017-01-05 DIAGNOSIS — Z8249 Family history of ischemic heart disease and other diseases of the circulatory system: Secondary | ICD-10-CM | POA: Diagnosis not present

## 2017-01-05 DIAGNOSIS — G894 Chronic pain syndrome: Secondary | ICD-10-CM

## 2017-01-05 DIAGNOSIS — R52 Pain, unspecified: Secondary | ICD-10-CM

## 2017-01-05 DIAGNOSIS — I6529 Occlusion and stenosis of unspecified carotid artery: Secondary | ICD-10-CM | POA: Diagnosis not present

## 2017-01-05 DIAGNOSIS — M5136 Other intervertebral disc degeneration, lumbar region: Secondary | ICD-10-CM

## 2017-01-05 DIAGNOSIS — Z9049 Acquired absence of other specified parts of digestive tract: Secondary | ICD-10-CM | POA: Diagnosis not present

## 2017-01-05 DIAGNOSIS — M51369 Other intervertebral disc degeneration, lumbar region without mention of lumbar back pain or lower extremity pain: Secondary | ICD-10-CM

## 2017-01-05 DIAGNOSIS — Z7982 Long term (current) use of aspirin: Secondary | ICD-10-CM | POA: Diagnosis not present

## 2017-01-05 DIAGNOSIS — Z87442 Personal history of urinary calculi: Secondary | ICD-10-CM | POA: Diagnosis not present

## 2017-01-05 MED ORDER — SODIUM CHLORIDE 0.9% FLUSH
10.0000 mL | Freq: Once | INTRAVENOUS | Status: AC
  Administered 2017-01-05: 5 mL

## 2017-01-05 MED ORDER — SODIUM CHLORIDE 0.9 % IJ SOLN
INTRAMUSCULAR | Status: AC
  Filled 2017-01-05: qty 10

## 2017-01-05 MED ORDER — ROPIVACAINE HCL 2 MG/ML IJ SOLN
10.0000 mL | Freq: Once | INTRAMUSCULAR | Status: AC
  Administered 2017-01-05: 1 mL via EPIDURAL
  Filled 2017-01-05: qty 10

## 2017-01-05 MED ORDER — TRIAMCINOLONE ACETONIDE 40 MG/ML IJ SUSP
INTRAMUSCULAR | Status: AC
  Filled 2017-01-05: qty 1

## 2017-01-05 MED ORDER — LIDOCAINE HCL (PF) 1 % IJ SOLN
5.0000 mL | Freq: Once | INTRAMUSCULAR | Status: AC
  Administered 2017-01-05: 5 mL via SUBCUTANEOUS
  Filled 2017-01-05: qty 5

## 2017-01-05 MED ORDER — TRIAMCINOLONE ACETONIDE 40 MG/ML IJ SUSP
40.0000 mg | Freq: Once | INTRAMUSCULAR | Status: AC
  Administered 2017-01-05: 40 mg
  Filled 2017-01-05: qty 1

## 2017-01-05 MED ORDER — IOPAMIDOL (ISOVUE-M 200) INJECTION 41%
20.0000 mL | Freq: Once | INTRAMUSCULAR | Status: DC | PRN
  Administered 2017-01-05: 10 mL
  Filled 2017-01-05: qty 20

## 2017-01-05 NOTE — Progress Notes (Signed)
Safety precautions to be maintained throughout the outpatient stay will include: orient to surroundings, keep bed in low position, maintain call bell within reach at all times, provide assistance with transfer out of bed and ambulation.  

## 2017-01-05 NOTE — Patient Instructions (Signed)
Pain Management Discharge Instructions  General Discharge Instructions :  If you need to reach your doctor call: Monday-Friday 8:00 am - 4:00 pm at [phone] or toll free [phone].  After clinic hours [phone] to have operator reach doctor.  Bring all of your medication bottles to all your appointments in the pain clinic.  To cancel or reschedule your appointment with Pain Management please remember to call 24 hours in advance to avoid a fee.  Refer to the educational materials which you have been given on: General Risks, I had my Procedure. Discharge Instructions, Post Sedation.  Post Procedure Instructions:  The drugs you were given will stay in your system until tomorrow, so for the next 24 hours you should not drive, make any legal decisions or drink any alcoholic beverages.  You may eat anything you prefer, but it is better to start with liquids then soups and crackers, and gradually work up to solid foods.  Please notify your doctor immediately if you have any unusual bleeding, trouble breathing or pain that is not related to your normal pain.  Depending on the type of procedure that was done, some parts of your body may feel week and/or numb.  This usually clears up by tonight or the next day.  Walk with the use of an assistive device or accompanied by an adult for the 24 hours.  You may use ice on the affected area for the first 24 hours.  Put ice in a Ziploc bag and cover with a towel and place against area 15 minutes on 15 minutes off.  You may switch to heat after 24 hours.GENERAL RISKS AND COMPLICATIONS  What are the risk, side effects and possible complications? Generally speaking, most procedures are safe.  However, with any procedure there are risks, side effects, and the possibility of complications.  The risks and complications are dependent upon the sites that are lesioned, or the type of nerve block to be performed.  The closer the procedure is to the spine,  the more serious the risks are.  Great care is taken when placing the radio frequency needles, block needles or lesioning probes, but sometimes complications can occur. 1. Infection: Any time there is an injection through the skin, there is a risk of infection.  This is why sterile conditions are used for these blocks.  There are four possible types of infection. 1. Localized skin infection. 2. Central Nervous System Infection-This can be in the form of Meningitis, which can be deadly. 3. Epidural Infections-This can be in the form of an epidural abscess, which can cause pressure inside of the spine, causing compression of the spinal cord with subsequent paralysis. This would require an emergency surgery to decompress, and there are no guarantees that the patient would recover from the paralysis. 4. Discitis-This is an infection of the intervertebral discs.  It occurs in about 1% of discography procedures.  It is difficult to treat and it may lead to surgery.        2. Pain: the needles have to go through skin and soft tissues, will cause soreness.       3. Damage to internal structures:  The nerves to be lesioned may be near blood vessels or    other nerves which can be potentially damaged.       4. Bleeding: Bleeding is more common if the patient is taking blood thinners such as  aspirin, Coumadin, Ticiid, Plavix, etc., or if he/she have some genetic predisposition  such as   hemophilia. Bleeding into the spinal canal can cause compression of the spinal  cord with subsequent paralysis.  This would require an emergency surgery to  decompress and there are no guarantees that the patient would recover from the  paralysis.       5. Pneumothorax:  Puncturing of a lung is a possibility, every time a needle is introduced in  the area of the chest or upper back.  Pneumothorax refers to free air around the  collapsed lung(s), inside of the thoracic cavity (chest cavity).  Another two possible  complications  related to a similar event would include: Hemothorax and Chylothorax.   These are variations of the Pneumothorax, where instead of air around the collapsed  lung(s), you may have blood or chyle, respectively.       6. Spinal headaches: They may occur with any procedures in the area of the spine.       7. Persistent CSF (Cerebro-Spinal Fluid) leakage: This is a rare problem, but may occur  with prolonged intrathecal or epidural catheters either due to the formation of a fistulous  track or a dural tear.       8. Nerve damage: By working so close to the spinal cord, there is always a possibility of  nerve damage, which could be as serious as a permanent spinal cord injury with  paralysis.       9. Death:  Although rare, severe deadly allergic reactions known as "Anaphylactic  reaction" can occur to any of the medications used.      10. Worsening of the symptoms:  We can always make thing worse.  What are the chances of something like this happening? Chances of any of this occuring are extremely low.  By statistics, you have more of a chance of getting killed in a motor vehicle accident: while driving to the hospital than any of the above occurring .  Nevertheless, you should be aware that they are possibilities.  In general, it is similar to taking a shower.  Everybody knows that you can slip, hit your head and get killed.  Does that mean that you should not shower again?  Nevertheless always keep in mind that statistics do not mean anything if you happen to be on the wrong side of them.  Even if a procedure has a 1 (one) in a 1,000,000 (million) chance of going wrong, it you happen to be that one..Also, keep in mind that by statistics, you have more of a chance of having something go wrong when taking medications.  Who should not have this procedure? If you are on a blood thinning medication (e.g. Coumadin, Plavix, see list of "Blood Thinners"), or if you have an active infection going on, you should not  have the procedure.  If you are taking any blood thinners, please inform your physician.  How should I prepare for this procedure?  Do not eat or drink anything at least six hours prior to the procedure.  Bring a driver with you .  It cannot be a taxi.  Come accompanied by an adult that can drive you back, and that is strong enough to help you if your legs get weak or numb from the local anesthetic.  Take all of your medicines the morning of the procedure with just enough water to swallow them.  If you have diabetes, make sure that you are scheduled to have your procedure done first thing in the morning, whenever possible.  If you have diabetes,   take only half of your insulin dose and notify our nurse that you have done so as soon as you arrive at the clinic.  If you are diabetic, but only take blood sugar pills (oral hypoglycemic), then do not take them on the morning of your procedure.  You may take them after you have had the procedure.  Do not take aspirin or any aspirin-containing medications, at least eleven (11) days prior to the procedure.  They may prolong bleeding.  Wear loose fitting clothing that may be easy to take off and that you would not mind if it got stained with Betadine or blood.  Do not wear any jewelry or perfume  Remove any nail coloring.  It will interfere with some of our monitoring equipment.  NOTE: Remember that this is not meant to be interpreted as a complete list of all possible complications.  Unforeseen problems may occur.  BLOOD THINNERS The following drugs contain aspirin or other products, which can cause increased bleeding during surgery and should not be taken for 2 weeks prior to and 1 week after surgery.  If you should need take something for relief of minor pain, you may take acetaminophen which is found in Tylenol,m Datril, Anacin-3 and Panadol. It is not blood thinner. The products listed below are.  Do not take any of the products listed below  in addition to any listed on your instruction sheet.  A.P.C or A.P.C with Codeine Codeine Phosphate Capsules #3 Ibuprofen Ridaura  ABC compound Congesprin Imuran rimadil  Advil Cope Indocin Robaxisal  Alka-Seltzer Effervescent Pain Reliever and Antacid Coricidin or Coricidin-D  Indomethacin Rufen  Alka-Seltzer plus Cold Medicine Cosprin Ketoprofen S-A-C Tablets  Anacin Analgesic Tablets or Capsules Coumadin Korlgesic Salflex  Anacin Extra Strength Analgesic tablets or capsules CP-2 Tablets Lanoril Salicylate  Anaprox Cuprimine Capsules Levenox Salocol  Anexsia-D Dalteparin Magan Salsalate  Anodynos Darvon compound Magnesium Salicylate Sine-off  Ansaid Dasin Capsules Magsal Sodium Salicylate  Anturane Depen Capsules Marnal Soma  APF Arthritis pain formula Dewitt's Pills Measurin Stanback  Argesic Dia-Gesic Meclofenamic Sulfinpyrazone  Arthritis Bayer Timed Release Aspirin Diclofenac Meclomen Sulindac  Arthritis pain formula Anacin Dicumarol Medipren Supac  Analgesic (Safety coated) Arthralgen Diffunasal Mefanamic Suprofen  Arthritis Strength Bufferin Dihydrocodeine Mepro Compound Suprol  Arthropan liquid Dopirydamole Methcarbomol with Aspirin Synalgos  ASA tablets/Enseals Disalcid Micrainin Tagament  Ascriptin Doan's Midol Talwin  Ascriptin A/D Dolene Mobidin Tanderil  Ascriptin Extra Strength Dolobid Moblgesic Ticlid  Ascriptin with Codeine Doloprin or Doloprin with Codeine Momentum Tolectin  Asperbuf Duoprin Mono-gesic Trendar  Aspergum Duradyne Motrin or Motrin IB Triminicin  Aspirin plain, buffered or enteric coated Durasal Myochrisine Trigesic  Aspirin Suppositories Easprin Nalfon Trillsate  Aspirin with Codeine Ecotrin Regular or Extra Strength Naprosyn Uracel  Atromid-S Efficin Naproxen Ursinus  Auranofin Capsules Elmiron Neocylate Vanquish  Axotal Emagrin Norgesic Verin  Azathioprine Empirin or Empirin with Codeine Normiflo Vitamin E  Azolid Emprazil Nuprin Voltaren  Bayer  Aspirin plain, buffered or children's or timed BC Tablets or powders Encaprin Orgaran Warfarin Sodium  Buff-a-Comp Enoxaparin Orudis Zorpin  Buff-a-Comp with Codeine Equegesic Os-Cal-Gesic   Buffaprin Excedrin plain, buffered or Extra Strength Oxalid   Bufferin Arthritis Strength Feldene Oxphenbutazone   Bufferin plain or Extra Strength Feldene Capsules Oxycodone with Aspirin   Bufferin with Codeine Fenoprofen Fenoprofen Pabalate or Pabalate-SF   Buffets II Flogesic Panagesic   Buffinol plain or Extra Strength Florinal or Florinal with Codeine Panwarfarin   Buf-Tabs Flurbiprofen Penicillamine   Butalbital Compound Four-way cold tablets   Penicillin   Butazolidin Fragmin Pepto-Bismol   Carbenicillin Geminisyn Percodan   Carna Arthritis Reliever Geopen Persantine   Carprofen Gold's salt Persistin   Chloramphenicol Goody's Phenylbutazone   Chloromycetin Haltrain Piroxlcam   Clmetidine heparin Plaquenil   Cllnoril Hyco-pap Ponstel   Clofibrate Hydroxy chloroquine Propoxyphen         Before stopping any of these medications, be sure to consult the physician who ordered them.  Some, such as Coumadin (Warfarin) are ordered to prevent or treat serious conditions such as "deep thrombosis", "pumonary embolisms", and other heart problems.  The amount of time that you may need off of the medication may also vary with the medication and the reason for which you were taking it.  If you are taking any of these medications, please make sure you notify your pain physician before you undergo any procedures.         Epidural Steroid Injection Patient Information  Description: The epidural space surrounds the nerves as they exit the spinal cord.  In some patients, the nerves can be compressed and inflamed by a bulging disc or a tight spinal canal (spinal stenosis).  By injecting steroids into the epidural space, we can bring irritated nerves into direct contact with a potentially helpful medication.   These steroids act directly on the irritated nerves and can reduce swelling and inflammation which often leads to decreased pain.  Epidural steroids may be injected anywhere along the spine and from the neck to the low back depending upon the location of your pain.   After numbing the skin with local anesthetic (like Novocaine), a small needle is passed into the epidural space slowly.  You may experience a sensation of pressure while this is being done.  The entire block usually last less than 10 minutes.  Conditions which may be treated by epidural steroids:   Low back and leg pain  Neck and arm pain  Spinal stenosis  Post-laminectomy syndrome  Herpes zoster (shingles) pain  Pain from compression fractures  Preparation for the injection:  1. Do not eat any solid food or dairy products within 8 hours of your appointment.  2. You may drink clear liquids up to 3 hours before appointment.  Clear liquids include water, black coffee, juice or soda.  No milk or cream please. 3. You may take your regular medication, including pain medications, with a sip of water before your appointment  Diabetics should hold regular insulin (if taken separately) and take 1/2 normal NPH dos the morning of the procedure.  Carry some sugar containing items with you to your appointment. 4. A driver must accompany you and be prepared to drive you home after your procedure.  5. Bring all your current medications with your. 6. An IV may be inserted and sedation may be given at the discretion of the physician.   7. A blood pressure cuff, EKG and other monitors will often be applied during the procedure.  Some patients may need to have extra oxygen administered for a short period. 8. You will be asked to provide medical information, including your allergies, prior to the procedure.  We must know immediately if you are taking blood thinners (like Coumadin/Warfarin)  Or if you are allergic to IV iodine contrast (dye). We  must know if you could possible be pregnant.  Possible side-effects:  Bleeding from needle site  Infection (rare, may require surgery)  Nerve injury (rare)  Numbness & tingling (temporary)  Difficulty urinating (rare, temporary)  Spinal headache (   a headache worse with upright posture)  Light -headedness (temporary)  Pain at injection site (several days)  Decreased blood pressure (temporary)  Weakness in arm/leg (temporary)  Pressure sensation in back/neck (temporary)  Call if you experience:  Fever/chills associated with headache or increased back/neck pain.  Headache worsened by an upright position.  New onset weakness or numbness of an extremity below the injection site  Hives or difficulty breathing (go to the emergency room)  Inflammation or drainage at the infection site  Severe back/neck pain  Any new symptoms which are concerning to you  Please note:  Although the local anesthetic injected can often make your back or neck feel good for several hours after the injection, the pain will likely return.  It takes 3-7 days for steroids to work in the epidural space.  You may not notice any pain relief for at least that one week.  If effective, we will often do a series of three injections spaced 3-6 weeks apart to maximally decrease your pain.  After the initial series, we generally will wait several months before considering a repeat injection of the same type.  If you have any questions, please call [phone] [facility] 

## 2017-01-06 ENCOUNTER — Telehealth: Payer: Self-pay

## 2017-01-06 NOTE — Telephone Encounter (Signed)
Post procedure phone call.  Patient states she is doing real good.  

## 2017-01-06 NOTE — Progress Notes (Signed)
Subjective:  Patient ID: Cassidy Hernandez, female    DOB: 06/16/1941  Age: 76 y.o. MRN: [id number]  CC: Back Pain (left hip and down the left leg.)   Service Provided on Last Visit: Evaluation PROCEDURE:L5-S1 epidural steroid under fluoroscopic as moderate sedation #1  HPI Cassidy Hernandez presents for her first epidural steroid injection today. The quality characteristic and distribution of her pain are unchanged from yesterday evaluation. Otherwise, she is in her usual state of health today.  By history she is a pleasant 76 year old white female with long-standing history of low back pain for 2 years. She describes a pain that starts in the left lower back with radiation in the posterior and lateral left leg radiating into the calf. The pain seems to be worse with standing or walking and has gradually gotten worse over the past several months. Her maximum pain score is an 8 and best a 1. It is worse with activity and aggravated by factor such as standing and walking. Alleviating factors include rest and relaxation. The quality of the pain is described as a shooting throbbing pain primarily in the left lumbar region radiating into the left calf with cramping associated. She has not experienced any numbness or tingling or weakness or problems with bowel or bladder dysfunction. 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   She has a past surgical history that includes Cholecystectomy; Abdominal hysterectomy; Cardiac catheterization (12/24/2014); Colonoscopy (N/A, 02/11/2016); and Augmentation mammaplasty (Bilateral, 1980).    Her family history includes Heart attack in her father and mother; Heart disease in her father and mother.She reports that she has never smoked. She has never used smokeless tobacco. Her alcohol and drug histories are not on file.    No results found for: TOXASSSELUR  Outpatient Medications Prior to Visit  Medication Sig Dispense Refill  . amLODipine (NORVASC) 5 MG tablet Take by mouth daily.     [name] aspirin EC 81 MG tablet Take by mouth daily.     [name] atenolol (TENORMIN) 50 MG tablet Take by mouth 2 (two) times daily.     . chlorthalidone (HYGROTON) 25 MG tablet TAKE 1/2 TABLET EVERY DAY    . losartan (COZAAR) 100 MG tablet Take 100 mg by mouth daily.     . Multiple Vitamin (MULTIVITAMIN) capsule Take by mouth daily.     . nitroGLYCERIN (NITROSTAT) 0.4 MG SL tablet Place under the tongue.    [name] omeprazole (PRILOSEC) 20 MG capsule Take by mouth daily.     . rosuvastatin (CRESTOR) 20 MG tablet Take by mouth daily.     [name] zolpidem (AMBIEN) 10 MG tablet Take by mouth at bedtime as needed.      No facility-administered medications prior to visit.    Lab Results  Component Value Date   WBC 13.2 (H) 04/19/2014   HGB 15.3 04/19/2014   HCT 46.4 04/19/2014   PLT 289 04/19/2014   GLUCOSE 131 (H) 04/19/2014   ALT 71 (H) 04/19/2014   AST 26 04/19/2014   NA 136 04/19/2014   K 3.5 04/19/2014   CL 102 04/19/2014   CREATININE 1.09 04/19/2014   BUN 21 (  H) 04/19/2014   CO2 26 04/19/2014    --------------------------------------------------------------------------------------------------------------------- Dg C-arm 1-60 Min-no Report  Result Date: 01/05/2017 Fluoroscopy was utilized by the requesting physician.  No radiographic interpretation.       ---------------------------------------------------------------------------------------------------------------------- Past Medical History:  Diagnosis Date  . 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 Degenerative arthritis   . GERD  (gastroesophageal reflux disease)   . H/O: hysterectomy   . Hypertension   . Kidney stone   . Subclavian steal syndrome     Past Surgical History:  Procedure Laterality Date  . ABDOMINAL HYSTERECTOMY    . AUGMENTATION MAMMAPLASTY Bilateral 1980  . 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 CHOLECYSTECTOMY    . COLONOSCOPY N/A 02/11/2016   Procedure: COLONOSCOPY;  Surgeon: Manya Silvas, MD;  Location: [facility] ENDOSCOPY;  Service: Endoscopy;  Laterality: N/A;    Family History  Problem Relation Age of Onset  . Heart attack Mother   . Heart disease Mother   . Heart attack Father   . Heart disease Father   . Breast cancer Neg Hx     Social History  Substance Use Topics  . Smoking status: Never Smoker  . Smokeless tobacco: Never Used  . Alcohol use Not on file    ---------------------------------------------------------------------------------------------------------------------  BP (!) 154/74   Pulse 63   Temp 98 F (36.7 C) (Oral)   Resp 18   Ht 5\' 3"  (1.6 m)   Wt 180 lb (81.6 kg)   SpO2 95%   BMI 31.89 kg/m    BP Readings from Last 3 Encounters:  01/05/17 (!) 154/74  01/04/17 (!) 160/64  02/11/16 105/82     Wt Readings from Last 3 Encounters:  01/05/17 180 lb (81.6 kg)  01/04/17 180 lb (81.6 kg)  02/11/16 180 lb (81.6 kg)     ----------------------------------------------------------------------------------------------------------------------  ROS Review of Systems  Cardiac: No chest pain Pulmonary: No shortness of breath or cyanosis Neurologic: As above  Objective:  BP (!) 154/74   Pulse 63   Temp 98 F (36.7 C) (Oral)   Resp 18   Ht 5\' 3"  (1.6 m)   Wt 180 lb (81.6 kg)   SpO2 95%   BMI 31.89 kg/m   Physical Exam Patient is alert oriented cooperative compliant and a good historian. Pupils are equally round reactive to light extraocular muscles intact Heart is regular rate and rhythm without murmur Lungs are clear  to auscultation No changes noted on musculoskeletal exam    Assessment & Plan:   Cassidy Hernandez was seen today for back pain.  Diagnoses and all orders for this visit:  DDD (degenerative disc disease), lumbar -     Lumbar Epidural Injection  Sciatica of left side -     triamcinolone acetonide (KENALOG-40) injection 40 mg; 1 mL (40 mg total) by Other route once. -     sodium chloride flush (NS) 0.9 % injection 10 mL; 10 mLs by Other route once. -     ropivacaine (PF) 2 mg/mL (0.2%) (NAROPIN) injection 10 mL; 10 mLs by Epidural route once. -     lidocaine (PF) (XYLOCAINE) 1 % injection 5 mL; Inject 5 mLs into the skin once. -     iopamidol (ISOVUE-M) 41 % intrathecal injection 20 mL; 20 mLs by Other route once as needed for contrast. -     Lumbar Epidural Injection -     Lumbar Epidural Injection; Future  Chronic pain syndrome     ----------------------------------------------------------------------------------------------------------------------  Problem List Items Addressed This Visit    None    Visit Diagnoses    DDD (degenerative disc disease), lumbar    -  Primary   Relevant Medications   triamcinolone acetonide (KENALOG-40) injection 40 mg (Completed)   Sciatica of left side       Relevant Medications   triamcinolone acetonide (KENALOG-40) injection 40 mg (Completed)   sodium chloride flush (NS) 0.9 % injection 10 mL (Completed)   ropivacaine (PF) 2 mg/mL (0.2%) (NAROPIN) injection 10 mL (Completed)   lidocaine (PF) (XYLOCAINE) 1 % injection 5 mL (Completed)   iopamidol (ISOVUE-M) 41 % intrathecal injection 20 mL   Other Relevant Orders   Lumbar Epidural Injection   Chronic pain syndrome          ----------------------------------------------------------------------------------------------------------------------  1. Chronic pain syndrome   2. DDD (degenerative disc disease), lumbar We'll proceed with first lumbar epidural steroid today. The risks and benefits of an  once again reviewed. Plan is for return to clinic in 1 month for reevaluation possible repeat injection. Continue with core strengthening exercises. 3. Facet arthritis of lumbar region ([place])   4. Sciatica of left side  - Lumbar Epidural Injection; Future    ----------------------------------------------------------------------------------------------------------------------  I am having Cassidy Hernandez maintain her amLODipine, aspirin EC, atenolol, chlorthalidone, losartan, multivitamin, nitroGLYCERIN, omeprazole, rosuvastatin, and zolpidem. We administered triamcinolone acetonide, sodium chloride flush, ropivacaine (PF) 2 mg/mL (0.2%), lidocaine (PF), and iopamidol.   Meds ordered this encounter  Medications  . triamcinolone acetonide (KENALOG-40) injection 40 mg  . sodium chloride flush (NS) 0.9 % injection 10 mL  . ropivacaine (PF) 2 mg/mL (0.2%) (NAROPIN) injection 10 mL  . lidocaine (PF) (XYLOCAINE) 1 % injection 5 mL  . iopamidol (ISOVUE-M) 41 % intrathecal injection 20 mL    Procedure: #1 L5-S1 epidural steroid under fluoroscopic guidance with no sedation   Procedure: L5-S1 LESI with fluoroscopic guidance and moderate sedation  NOTE: The risks, benefits, and expectations of the procedure have been discussed and explained to the patient who was understanding and in agreement with suggested treatment plan. No guarantees were made.  DESCRIPTION OF PROCEDURE: Lumbar epidural steroid injection with IV Versed, EKG, blood pressure, pulse, and pulse oximetry monitoring. The procedure was performed with the patient in the prone position under fluoroscopic guidance. I injected subcutaneous lidocaine overlying the L5-S1 site after its fluoroscopic identifictation.  Using strict aseptic technique, I then advanced an 18-gauge Tuohy epidural needle in the midline using interlaminar approach via loss-of-resistance to saline technique. There was negative aspiration for heme or  CSF.  I then confirmed  position with both AP and Lateral fluoroscan. 2 cc of Isovue were injected and a  total of 5 mL of Preservative-Free normal saline mixed with 40 mg of Kenalog and 1cc Ropicaine 0.2 percent were injected incrementally via the  epidurally placed needle. The needle was removed. The patient tolerated the injection well and was convalesced and discharged to home in stable condition. Should the patient have any post procedure difficulty they have been instructed on how to contact us for assistance.     Follow-up: Return for evaluation, procedure.    Molli Barrows, MD 9:18 AM  The [facility] practitioner database for opioid medications on this patient has been reviewed by me and my staff   Greater than 50% of the total encounter time was spent in counseling and / or coordination of care.     This dictation was performed utilizing Systems analyst.  Please excuse any  unintentional or mistaken typographical errors as a result.

## 2017-01-06 NOTE — Progress Notes (Signed)
Subjective:  Patient ID: Cassidy Hernandez, female    DOB: 1941/03/08  Age: 76 y.o. MRN: [id number]  CC: Back Pain (left, lower)     PROCEDURE:None  HPI [name] presents for a new patient evaluation. Patient is a pleasant 76 year old white female with long-standing history of low back pain for 2 years. She describes a pain that starts in the left lower back with radiation in the posterior and lateral left leg radiating into the calf. The pain seems to be worse with standing or walking and has gradually gotten worse over the past several months. Her maximum pain score is an 8 and best a 1. It is worse with activity and aggravated by factor such as standing and walking. Alleviating factors include rest and relaxation. The quality of the pain is described as a shooting throbbing pain primarily in the left lumbar region radiating into the left calf with cramping associated. She has not experienced any numbness or tingling or weakness or problems with bowel or bladder dysfunction. 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   She has a past surgical history that includes Cholecystectomy; Abdominal hysterectomy; Cardiac catheterization (12/24/2014); Colonoscopy (N/A, 02/11/2016); and Augmentation mammaplasty (Bilateral, 1980).   Her family history includes Heart attack in her father and mother; Heart disease in her father and mother.She reports that she has never smoked. She has never used smokeless tobacco. Her alcohol and drug histories are not on file.    No results found for: TOXASSSELUR  Outpatient Medications  Prior to Visit  Medication Sig Dispense Refill  . amLODipine (NORVASC) 5 MG tablet Take by mouth daily.     [name] aspirin EC 81 MG tablet Take by mouth daily.     [name] atenolol (TENORMIN) 50 MG tablet Take by mouth 2 (two) times daily.     . chlorthalidone (HYGROTON) 25 MG tablet TAKE 1/2 TABLET EVERY DAY    . losartan (COZAAR) 100 MG tablet Take 100 mg by mouth daily.     . Multiple Vitamin (MULTIVITAMIN) capsule Take by mouth daily.     . nitroGLYCERIN (NITROSTAT) 0.4 MG SL tablet Place under the tongue.    [name] omeprazole (PRILOSEC) 20 MG capsule Take by mouth daily.     . rosuvastatin (CRESTOR) 20 MG tablet Take by mouth daily.     [name] zolpidem (AMBIEN) 10 MG tablet Take by mouth at bedtime as needed.      No facility-administered medications prior to visit.    Lab Results  Component Value Date   WBC 13.2 (H) 04/19/2014   HGB 15.3 04/19/2014   HCT 46.4 04/19/2014   PLT 289 04/19/2014   GLUCOSE 131 (H) 04/19/2014   ALT 71 (H) 04/19/2014   AST 26 04/19/2014   NA 136 04/19/2014   K 3.5 04/19/2014   CL 102 04/19/2014   CREATININE 1.09 04/19/2014   BUN 21 (H) 04/19/2014   CO2 26 04/19/2014    --------------------------------------------------------------------------------------------------------------------- Dg C-arm 1-60 Min-no Report  Result Date: 01/05/2017 Fluoroscopy was utilized by the requesting physician.  No radiographic interpretation.       ---------------------------------------------------------------------------------------------------------------------- Past Medical History:  Diagnosis Date  .  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 Degenerative arthritis   . GERD (gastroesophageal reflux disease)   . H/O: hysterectomy   . Hypertension   . Kidney stone   . Subclavian steal syndrome     Past Surgical History:  Procedure Laterality Date  . ABDOMINAL HYSTERECTOMY    . AUGMENTATION MAMMAPLASTY Bilateral 1980  . CARDIAC CATHETERIZATION  12/24/2014   60%  RCA large Proximal  . CHOLECYSTECTOMY    . COLONOSCOPY N/A 02/11/2016   Procedure: COLONOSCOPY;  Surgeon: Manya Silvas, MD;  Location: [facility] ENDOSCOPY;  Service: Endoscopy;  Laterality: N/A;    Family History  Problem Relation Age of Onset  . Heart attack Mother   . Heart disease Mother   . Heart attack Father   . Heart disease Father   . Breast cancer Neg Hx     Social History  Substance Use Topics  . Smoking status: Never Smoker  . Smokeless tobacco: Never Used  . Alcohol use Not on file    ---------------------------------------------------------------------------------------------------------------------  BP (!) 160/64   Pulse 62   Temp 98.2 F (36.8 C) (Oral)   Resp 18   Ht 5\' 3"  (1.6 m)   Wt 180 lb (81.6 kg)   SpO2 95%   BMI 31.89 kg/m    BP Readings from Last 3 Encounters:  01/05/17 (!) 154/74  01/04/17 (!) 160/64  02/11/16 105/82     Wt Readings from Last 3 Encounters:  01/05/17 180 lb (81.6 kg)  01/04/17 180 lb (81.6 kg)  02/11/16 180 lb (81.6 kg)     ----------------------------------------------------------------------------------------------------------------------  ROS Review of Systems  Cardiac: No chest pain Pulmonary: No shortness of breath or cyanosis Neurologic: As above Psychologic: No history of drug abuse for anxiety or depression GI: Positive reflux  Objective:  BP (!) 160/64   Pulse 62   Temp 98.2 F (36.8 C) (Oral)   Resp 18   Ht 5\' 3"  (1.6 m)   Wt 180 lb (81.6 kg)   SpO2 95%   BMI 31.89 kg/m   Physical Exam Patient is alert oriented cooperative compliant and a good historian. Pupils are equally round reactive to light extraocular muscles intact Heart is regular rate and rhythm without murmur Lungs are clear to auscultation Inspection of the back reveals some paraspinous muscle tenderness but no overt trigger points. She does have a positive straight leg raise at approximately 45 on the left side and her strength  is slightly diminished at 5 minus over 5 to flexion at the left knee compared to right. Otherwise her muscle tone and bulk is good and she has slightly diminished sensation to base of the left foot.     Assessment & Plan:   Francies was seen today for back pain.  Diagnoses and all orders for this visit:  Chronic pain syndrome -     ToxASSURE Select 13 (MW), Urine  DDD (degenerative disc disease), lumbar -     Lumbar Epidural Injection; Future  Facet arthritis of lumbar region [facility])  Sciatica of left side -     Lumbar Epidural Injection; Future     ----------------------------------------------------------------------------------------------------------------------  Problem List Items Addressed This Visit    None    Visit Diagnoses    Chronic pain syndrome    -  Primary   Relevant Orders   ToxASSURE Select 13 (MW), Urine   DDD (degenerative disc disease), lumbar       Relevant Orders   Lumbar Epidural Injection   Facet  arthritis of lumbar region [facility])       Sciatica of left side       Relevant Orders   Lumbar Epidural Injection      ----------------------------------------------------------------------------------------------------------------------  1. Chronic pain syndrome   2. DDD (degenerative disc disease), lumbar We'll proceed with a series of lumbar epidural steroids as described to the patient. The risks and benefits of an reviewed with her in full detail and we'll try and worker and as soon as possible for her first injection. I have talked about some exercises including some stretches that might help with management of her low back pain. We have also talked about efforts at weight loss and core strengthening. - Lumbar Epidural Injection; Future  3. Facet arthritis of lumbar region ([place])   4. Sciatica of left side  - Lumbar Epidural Injection;  Future    ----------------------------------------------------------------------------------------------------------------------  I am having Ms. Schriner maintain her amLODipine, aspirin EC, atenolol, chlorthalidone, losartan, multivitamin, nitroGLYCERIN, omeprazole, rosuvastatin, and zolpidem.   No orders of the defined types were placed in this encounter.      Follow-up: Return in about 2 weeks (around 01/18/2017) for evaluation, procedure.    Molli Barrows, MD 9:02 AM  The [place] practitioner database for opioid medications on this patient has been reviewed by me and my staff   Greater than 50% of the total encounter time was spent in counseling and / or coordination of care.     This dictation was performed utilizing Systems analyst.  Please excuse any unintentional or mistaken typographical errors as a result.

## 2017-01-08 LAB — TOXASSURE SELECT 13 (MW), URINE

## 2017-01-10 ENCOUNTER — Ambulatory Visit: Payer: Medicare Other | Attending: Unknown Physician Specialty | Admitting: Speech Pathology

## 2017-01-10 DIAGNOSIS — R490 Dysphonia: Secondary | ICD-10-CM | POA: Diagnosis not present

## 2017-01-11 ENCOUNTER — Encounter: Payer: Self-pay | Admitting: Speech Pathology

## 2017-01-11 NOTE — Therapy (Signed)
[place] MAIN [facility] SERVICES [address] [place], [place], [address] Phone: [phone]   Fax:  [phone]  Speech Language Pathology Treatment  Patient Details  Name: TARONDA COMACHO MRN: [id number] Date of Birth: 1941-05-06 Referring Provider: Dr. Tami Ribas  Encounter Date: 01/10/2017      End of Session - 01/11/17 1249    Visit Number 3   Number of Visits 17   Date for SLP Re-Evaluation 02/25/17   SLP Start Time 1400   SLP Stop Time  1450   SLP Time Calculation (min) 50 min   Activity Tolerance Patient tolerated treatment well      Past Medical History:  Diagnosis Date  . 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 Degenerative arthritis   . GERD (gastroesophageal reflux disease)   . H/O: hysterectomy   . Hypertension   . Kidney stone   . Subclavian steal syndrome     Past Surgical History:  Procedure Laterality Date  . ABDOMINAL HYSTERECTOMY    . AUGMENTATION MAMMAPLASTY Bilateral 1980  . 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 CHOLECYSTECTOMY    . COLONOSCOPY N/A 02/11/2016   Procedure: COLONOSCOPY;  Surgeon: Manya Silvas, MD;  Location: [facility] ENDOSCOPY;  Service: Endoscopy;  Laterality: N/A;    There were no vitals filed for this visit.             ADULT SLP TREATMENT - 01/11/17 0001      General Information   Behavior/Cognition Alert;Cooperative;Pleasant mood   HPI 76 year old woman referred by Dr. Tami Ribas for voice therapy.  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  The patient describes her voice symptoms as "my voice just shuts off"     Treatment Provided   Treatment provided Cognitive-Linquistic     Pain Assessment   Pain Assessment No/denies pain     Cognitive-Linquistic Treatment   Treatment focused on Voice   Skilled Treatment The patient was provided with written and verbal teaching regarding neck, shoulder, tongue, and throat  stretches exercises to promote relaxed phonation. The patient was provided with written and verbal teaching regarding breath support exercises.  Patient observed to speak on residual air vs. taking replenishing breaths when needed.  Patient able to take deep breath before reading a sentence, exhale, and take a deep breath before reading next sentence with 50% accuracy.  As she continued the exercises, her self-monitoring improved.  Patient able to take deep breath before answering miscellaneous questions with 70% accuracy.     Assessment / Recommendations / Plan   Plan Continue with current plan of care     Progression Toward Goals   Progression toward goals Progressing toward goals          SLP Education - 01/11/17 1248    Education provided Yes   Education Details Role of good breath support and breathing habits to maintain phonation   Person(s) Educated Patient   Methods Explanation   Comprehension Verbalized understanding            SLP Long Term Goals - 12/26/16 1107      SLP LONG TERM GOAL #1   Title The patient will demonstrate independent understanding of vocal hygiene concepts and extrinsic laryngeal muscle stretches.     Time 8   Period Weeks   Status New     SLP LONG TERM GOAL #2   Title The patient will be independent for abdominal  breathing and breath support exercises.   Time 8   Period Weeks   Status New     SLP LONG TERM GOAL #3   Title The patient will maximize voice quality and loudness using breath support/oral resonance for paragraph length recitation with 80% accuracy.   Time 8   Period Weeks   Status New          Plan - 01/11/17 1250    Clinical Impression Statement  Patient able to improve vocal quality and vocal endurance with good breath support practices.  She appears to be habitually speaking on residual air vs. taking replenishing breaths as needed. She reports that her voice "gives out on me" while talking and walking.  Will plan to focus  on breathing and talking while walking next session.   Speech Therapy Frequency 2x / week   Duration Other (comment)   Treatment/Interventions Patient/family education;SLP instruction and feedback;Other (comment)  Voice therapy   Potential to Achieve Goals Good   Potential Considerations Ability to learn/carryover information;Co-morbidities;Cooperation/participation level;Medical prognosis;Pain level;Previous level of function;Severity of impairments;Family/community support   SLP Home Exercise Plan Breath support, resonant voice   Consulted and Agree with Plan of Care Patient      Patient will benefit from skilled therapeutic intervention in order to improve the following deficits and impairments:   Dysphonia    Problem List There are no active problems to display for this patient.  [name], [place], Susie 01/11/2017, 12:51 PM  [place] MAIN [facility] SERVICES [address] [place], [place], [address] Phone: [phone]   Fax:  [phone]   Name: MATHA MASSE MRN: [id number] Date of Birth: 11/25/1940

## 2017-01-12 ENCOUNTER — Ambulatory Visit: Payer: Medicare Other | Admitting: Speech Pathology

## 2017-01-12 DIAGNOSIS — R490 Dysphonia: Secondary | ICD-10-CM

## 2017-01-13 ENCOUNTER — Ambulatory Visit: Payer: No Typology Code available for payment source | Admitting: Speech Pathology

## 2017-01-13 ENCOUNTER — Encounter: Payer: Self-pay | Admitting: Speech Pathology

## 2017-01-13 NOTE — Therapy (Signed)
[place] MAIN [facility] SERVICES [address] [place], [place], [address] Phone: [phone]   Fax:  [phone]  Speech Language Pathology Treatment  Patient Details  Name: MARIBELLA KUNA MRN: [id number] Date of Birth: 03-01-1941 Referring Provider: Dr. Tami Ribas  Encounter Date: 01/12/2017      End of Session - 01/13/17 1110    Visit Number 4   Number of Visits 17   Date for SLP Re-Evaluation 02/25/17   SLP Start Time 1400   SLP Stop Time  1445   SLP Time Calculation (min) 45 min   Activity Tolerance Patient tolerated treatment well      Past Medical History:  Diagnosis Date  . 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 Degenerative arthritis   . GERD (gastroesophageal reflux disease)   . H/O: hysterectomy   . Hypertension   . Kidney stone   . Subclavian steal syndrome     Past Surgical History:  Procedure Laterality Date  . ABDOMINAL HYSTERECTOMY    . AUGMENTATION MAMMAPLASTY Bilateral 1980  . 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 CHOLECYSTECTOMY    . COLONOSCOPY N/A 02/11/2016   Procedure: COLONOSCOPY;  Surgeon: Manya Silvas, MD;  Location: [facility] ENDOSCOPY;  Service: Endoscopy;  Laterality: N/A;    There were no vitals filed for this visit.      Subjective Assessment - 01/13/17 1108    Subjective "I didn't loose my voice with my daughter"   Currently in Pain? No/denies               ADULT SLP TREATMENT - 01/13/17 0001      General Information   Behavior/Cognition Alert;Cooperative;Pleasant mood   HPI 76 year old woman referred by Dr. Tami Ribas for voice therapy.  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  The patient describes her voice symptoms as "my voice just shuts off"     Treatment Provided   Treatment provided Cognitive-Linquistic     Pain Assessment   Pain Assessment No/denies pain     Cognitive-Linquistic Treatment   Treatment  focused on Voice   Skilled Treatment The patient was provided with written and verbal teaching regarding neck, shoulder, tongue, and throat stretches exercises to promote relaxed phonation. The patient was provided with written and verbal teaching regarding breath support exercises.  Patient observed to speak on residual air vs. taking replenishing breaths when needed.  Patient able to take deep breath before reading a sentence, exhale, and take a deep breath before reading next sentence with 90% accuracy.  Patient able to take deep breath before answering miscellaneous questions with 90% accuracy.  Patient able to maintain good breath support for sitting conversation with 90% accuracy and reports doing well in conversation with her daughter.  Patient became out of breath while walking and talking.     Assessment / Recommendations / Plan   Plan Continue with current plan of care     Progression Toward Goals   Progression toward goals Progressing toward goals          SLP Education - 01/13/17 1109    Education provided Yes   Education Details Role of good breath support and breathing habits to maintain phonation   Person(s) Educated Patient   Methods Explanation   Comprehension Verbalized understanding            SLP Long Term Goals - 12/26/16 1107      SLP LONG  TERM GOAL #1   Title The patient will demonstrate independent understanding of vocal hygiene concepts and extrinsic laryngeal muscle stretches.     Time 8   Period Weeks   Status New     SLP LONG TERM GOAL #2   Title The patient will be independent for abdominal breathing and breath support exercises.   Time 8   Period Weeks   Status New     SLP LONG TERM GOAL #3   Title The patient will maximize voice quality and loudness using breath support/oral resonance for paragraph length recitation with 80% accuracy.   Time 8   Period Weeks   Status New          Plan - 01/13/17 1110    Clinical Impression Statement   Patient able to improve vocal quality and vocal endurance with good breath support practices.  She appears to improvement in habitual speaking on residual air vs. taking replenishing breaths as needed. Patient able to maintain good breath support for sitting conversation with 90% accuracy and reports doing well in conversation with her daughter.  Patient became out of breath while walking and talking.   Speech Therapy Frequency 2x / week   Duration Other (comment)   Treatment/Interventions Patient/family education;SLP instruction and feedback;Other (comment)   Potential to Achieve Goals Good   Potential Considerations Ability to learn/carryover information;Co-morbidities;Cooperation/participation level;Medical prognosis;Pain level;Previous level of function;Severity of impairments;Family/community support   SLP Home Exercise Plan Breath support, resonant voice   Consulted and Agree with Plan of Care Patient      Patient will benefit from skilled therapeutic intervention in order to improve the following deficits and impairments:   Dysphonia    Problem List There are no active problems to display for this patient.  [name], MS/CCC- SLP  Lou Miner 01/13/2017, 11:11 AM  [place] MAIN [facility] SERVICES [address] [place], [place], [address] Phone: [phone]   Fax:  [phone]   Name: PATRCIA SCHNEPP MRN: [id number] Date of Birth: 1940/09/14

## 2017-01-17 ENCOUNTER — Ambulatory Visit: Payer: Medicare Other | Admitting: Speech Pathology

## 2017-01-17 ENCOUNTER — Encounter: Payer: Self-pay | Admitting: Speech Pathology

## 2017-01-17 DIAGNOSIS — R490 Dysphonia: Secondary | ICD-10-CM | POA: Diagnosis not present

## 2017-01-17 NOTE — Therapy (Signed)
[place] MAIN [facility] SERVICES [address] [place], [place], [address] Phone: [phone]   Fax:  [phone]  Speech Language Pathology Treatment  Patient Details  Name: Cassidy Hernandez MRN: [id number] Date of Birth: Dec 01, 1940 Referring Provider: Dr. Tami Ribas  Encounter Date: 01/17/2017      End of Session - 01/17/17 1647    Visit Number 5   Number of Visits 17   Date for SLP Re-Evaluation 02/25/17   SLP Start Time 1330   SLP Stop Time  4315   SLP Time Calculation (min) 45 min   Activity Tolerance Patient tolerated treatment well      Past Medical History:  Diagnosis Date  . 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 Degenerative arthritis   . GERD (gastroesophageal reflux disease)   . H/O: hysterectomy   . Hypertension   . Kidney stone   . Subclavian steal syndrome     Past Surgical History:  Procedure Laterality Date  . ABDOMINAL HYSTERECTOMY    . AUGMENTATION MAMMAPLASTY Bilateral 1980  . 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 CHOLECYSTECTOMY    . COLONOSCOPY N/A 02/11/2016   Procedure: COLONOSCOPY;  Surgeon: Manya Silvas, MD;  Location: [facility] ENDOSCOPY;  Service: Endoscopy;  Laterality: N/A;    There were no vitals filed for this visit.      Subjective Assessment - 01/17/17 1646    Subjective Patient will be traveling for the next 2 weeks   Currently in Pain? No/denies               ADULT SLP TREATMENT - 01/17/17 0001      General Information   Behavior/Cognition Alert;Cooperative;Pleasant mood   HPI 76 year old woman referred by Dr. Tami Ribas for voice therapy.  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  The patient describes her voice symptoms as "my voice just shuts off"     Treatment Provided   Treatment provided Cognitive-Linquistic     Pain Assessment   Pain Assessment No/denies pain     Cognitive-Linquistic Treatment   Treatment focused on Voice   Skilled Treatment The patient was provided with written and verbal teaching regarding neck, shoulder, tongue, and throat stretches exercises to promote relaxed phonation. The patient was provided with written and verbal teaching regarding breath support exercises.  Patient observed to speak on residual air vs. taking replenishing breaths when needed.  Patient able to take deep breath before reading a sentence, exhale, and take a deep breath before reading next sentence with 90% accuracy.  Patient able to take deep breath before answering miscellaneous questions with 90% accuracy.  Patient able to maintain good breath support for sitting and walking conversation with 90% accuracy and reports doing well in conversation with her daughter.  Patient did not became out of breath while walking and talking.     Assessment / Recommendations / Plan   Plan Continue with current plan of care     Progression Toward Goals   Progression toward goals Progressing toward goals          SLP Education - 01/17/17 1647    Education provided Yes   Education Details Role of good breath support and breathing habits to maintain phonation   Person(s) Educated Patient   Methods Explanation   Comprehension Verbalized understanding            SLP Long Term Goals - 12/26/16 1107  SLP LONG TERM GOAL #1   Title The patient will demonstrate independent understanding of vocal hygiene concepts and extrinsic laryngeal muscle stretches.     Time 8   Period Weeks   Status New     SLP LONG TERM GOAL #2   Title The patient will be independent for abdominal breathing and breath support exercises.   Time 8   Period Weeks   Status New     SLP LONG TERM GOAL #3   Title The patient will maximize voice quality and loudness using breath support/oral resonance for paragraph length recitation with 80% accuracy.   Time 8   Period Weeks   Status New          Plan - 01/17/17 1647     Clinical Impression Statement  Patient able to improve vocal quality and vocal endurance with good breath support practices.  She demonstrates improvement in habitual speaking on residual air vs. taking replenishing breaths as needed. Patient able to maintain good breath support for sitting and walking conversation with 90% accuracy and reports doing well in conversation with her daughter.     Speech Therapy Frequency 2x / week   Duration Other (comment)   Treatment/Interventions Patient/family education;SLP instruction and feedback;Other (comment)  Voice therapy   Potential to Achieve Goals Good   Potential Considerations Ability to learn/carryover information;Co-morbidities;Cooperation/participation level;Medical prognosis;Pain level;Previous level of function;Severity of impairments;Family/community support   SLP Home Exercise Plan Breath support, resonant voice   Consulted and Agree with Plan of Care Patient      Patient will benefit from skilled therapeutic intervention in order to improve the following deficits and impairments:   Dysphonia    Problem List There are no active problems to display for this patient.  [name], MS/CCC- SLP  Lou Miner 01/17/2017, 4:48 PM  [place] MAIN [facility] SERVICES [address] [place], [place], [address] Phone: [phone]   Fax:  [phone]   Name: Cassidy Hernandez MRN: [id number] Date of Birth: 09-Jan-1941

## 2017-01-19 ENCOUNTER — Ambulatory Visit: Payer: No Typology Code available for payment source | Admitting: Speech Pathology

## 2017-01-24 ENCOUNTER — Ambulatory Visit: Payer: No Typology Code available for payment source | Admitting: Speech Pathology

## 2017-02-03 ENCOUNTER — Ambulatory Visit: Payer: No Typology Code available for payment source | Admitting: Speech Pathology

## 2017-02-06 ENCOUNTER — Ambulatory Visit: Payer: Medicare Other | Attending: Unknown Physician Specialty | Admitting: Speech Pathology

## 2017-02-06 ENCOUNTER — Encounter: Payer: Self-pay | Admitting: Speech Pathology

## 2017-02-06 DIAGNOSIS — R490 Dysphonia: Secondary | ICD-10-CM | POA: Insufficient documentation

## 2017-02-06 NOTE — Therapy (Signed)
[place] MAIN [facility] SERVICES [address] [place], [place], [address] Phone: [phone]   Fax:  [phone]  Speech Language Pathology Discharge Summary  Patient Details  Name: Cassidy Hernandez MRN: [id number] Date of Birth: June 10, 1941 Referring Provider: Dr. Tami Ribas  Encounter Date: 02/06/2017      End of Session - 02/06/17 1508    Visit Number 6   Number of Visits 17   Date for SLP Re-Evaluation 02/25/17   SLP Start Time 7   SLP Stop Time  1430   SLP Time Calculation (min) 30 min   Activity Tolerance Patient tolerated treatment well      Past Medical History:  Diagnosis Date  . 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 Degenerative arthritis   . GERD (gastroesophageal reflux disease)   . H/O: hysterectomy   . Hypertension   . Kidney stone   . Subclavian steal syndrome     Past Surgical History:  Procedure Laterality Date  . ABDOMINAL HYSTERECTOMY    . AUGMENTATION MAMMAPLASTY Bilateral 1980  . 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 CHOLECYSTECTOMY    . COLONOSCOPY N/A 02/11/2016   Procedure: COLONOSCOPY;  Surgeon: Manya Silvas, MD;  Location: [facility] ENDOSCOPY;  Service: Endoscopy;  Laterality: N/A;    There were no vitals filed for this visit.      Subjective Assessment - 02/06/17 1506    Subjective Patient said she is able to talk longer now than she could before speech therapy and is ready to be discharged.   Currently in Pain? No/denies               ADULT SLP TREATMENT - 02/06/17 0001      General Information   Behavior/Cognition Alert;Cooperative;Pleasant mood   HPI 76 year old woman referred by Dr. Tami Ribas for voice therapy.  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  The patient describes her voice symptoms as "my voice just shuts off"     Treatment Provided   Treatment provided Cognitive-Linquistic     Pain Assessment   Pain  Assessment No/denies pain     Cognitive-Linquistic Treatment   Treatment focused on Voice   Skilled Treatment  The patient was provided with written and verbal teaching regarding neck, shoulder, tongue, and throat stretches and exercises to promote relaxed phonation. The patient was provided with written and verbal teaching regarding breath support exercises. Patient able to take deep breath before reading a sentence, exhale, and take a deep breath before reading next sentence with 90% accuracy.  Patient able to take deep breath before answering miscellaneous questions with 90% accuracy. Patient able to maintain good breath support for conversational speech. Patient was able to read aloud for 10 minutes without becoming out of breath. Patient engaged in conversation with clinician for a total of 20 minutes and did not become out of breath.      Assessment / Recommendations / Plan   Plan All goals met;Discharge SLP treatment due to (comment)     Progression Toward Goals   Progression toward goals Goals met, education completed, patient discharged from SLP          SLP Education - 02/06/17 1507    Education provided Yes   Education Details Breath support in conversation   Person(s) Educated Patient   Methods Explanation   Comprehension Verbalized understanding            SLP Long  Term Goals - 02/06/17 1547      SLP LONG TERM GOAL #1   Title The patient will demonstrate independent understanding of vocal hygiene concepts and extrinsic laryngeal muscle stretches.     Time 8   Period Weeks   Status Achieved     SLP LONG TERM GOAL #2   Title The patient will be independent for abdominal breathing and breath support exercises.   Time 8   Period Weeks   Status Achieved     SLP LONG TERM GOAL #3   Title The patient will maximize voice quality and loudness using breath support/oral resonance for paragraph length recitation with 80% accuracy.   Time 8   Period Weeks   Status  Achieved          Plan - 02/06/17 1508    Clinical Impression Statement Patient was given written and verbal education on stretches and exercises to promote relaxed phonation and better breath support. Patient has continued to practice these exercises. Patient increased the time on her sustained "ah" and "s" sounds. Patient shows improvement in habitual speaking on residual air vs. taking replenishing breaths as needed. Patient says her voice no longer feels "tired" when she talks and she is able to talk for a longer period of time. She does not get out of breath while reading or while engaging in conversation. Patient and clinician agree that patient is ready to be discharged.   Speech Therapy Frequency Other (comment)   Duration Other (comment)   Treatment/Interventions Patient/family education;SLP instruction and feedback;Other (comment)   Potential to Achieve Goals Good   Potential Considerations Ability to learn/carryover information;Co-morbidities;Cooperation/participation level;Medical prognosis;Pain level;Previous level of function;Severity of impairments;Family/community support   SLP Home Exercise Plan Breath support, resonant voice   Consulted and Agree with Plan of Care Patient      Patient will benefit from skilled therapeutic intervention in order to improve the following deficits and impairments:   Dysphonia    Problem List There are no active problems to display for this patient.   Aline August 02/06/2017, 3:48 PM  [place] MAIN [facility] SERVICES [address] [place], [place], [address] Phone: [phone]   Fax:  [phone]   Name: Cassidy Hernandez MRN: [id number] Date of Birth: 29-May-1941

## 2017-02-09 ENCOUNTER — Ambulatory Visit: Payer: No Typology Code available for payment source | Admitting: Speech Pathology

## 2017-03-01 ENCOUNTER — Ambulatory Visit
Admission: RE | Admit: 2017-03-01 | Discharge: 2017-03-01 | Disposition: A | Discharge status: Home / Self Care | Payer: Medicare Other | Source: Ambulatory Visit | Attending: Anesthesiology | Admitting: Anesthesiology

## 2017-03-01 ENCOUNTER — Ambulatory Visit (HOSPITAL_BASED_OUTPATIENT_CLINIC_OR_DEPARTMENT_OTHER): Payer: Medicare Other | Admitting: Anesthesiology

## 2017-03-01 ENCOUNTER — Encounter: Payer: Self-pay | Admitting: Anesthesiology

## 2017-03-01 ENCOUNTER — Other Ambulatory Visit: Payer: Self-pay | Admitting: Anesthesiology

## 2017-03-01 VITALS — BP 118/80 | HR 61 | Temp 98.6°F | Resp 16 | Ht 63.0 in | Wt 180.0 lb

## 2017-03-01 DIAGNOSIS — Z90710 Acquired absence of both cervix and uterus: Secondary | ICD-10-CM | POA: Insufficient documentation

## 2017-03-01 DIAGNOSIS — Z87442 Personal history of urinary calculi: Secondary | ICD-10-CM | POA: Insufficient documentation

## 2017-03-01 DIAGNOSIS — M5432 Sciatica, left side: Secondary | ICD-10-CM

## 2017-03-01 DIAGNOSIS — G894 Chronic pain syndrome: Secondary | ICD-10-CM

## 2017-03-01 DIAGNOSIS — M25552 Pain in left hip: Secondary | ICD-10-CM | POA: Diagnosis not present

## 2017-03-01 DIAGNOSIS — M1990 Unspecified osteoarthritis, unspecified site: Secondary | ICD-10-CM | POA: Diagnosis not present

## 2017-03-01 DIAGNOSIS — Z7982 Long term (current) use of aspirin: Secondary | ICD-10-CM | POA: Diagnosis not present

## 2017-03-01 DIAGNOSIS — Z8249 Family history of ischemic heart disease and other diseases of the circulatory system: Secondary | ICD-10-CM | POA: Diagnosis not present

## 2017-03-01 DIAGNOSIS — G458 Other transient cerebral ischemic attacks and related syndromes: Secondary | ICD-10-CM | POA: Diagnosis not present

## 2017-03-01 DIAGNOSIS — M5136 Other intervertebral disc degeneration, lumbar region: Secondary | ICD-10-CM | POA: Insufficient documentation

## 2017-03-01 DIAGNOSIS — R52 Pain, unspecified: Secondary | ICD-10-CM

## 2017-03-01 DIAGNOSIS — K219 Gastro-esophageal reflux disease without esophagitis: Secondary | ICD-10-CM | POA: Diagnosis not present

## 2017-03-01 DIAGNOSIS — Z79899 Other long term (current) drug therapy: Secondary | ICD-10-CM | POA: Insufficient documentation

## 2017-03-01 DIAGNOSIS — I10 Essential (primary) hypertension: Secondary | ICD-10-CM | POA: Insufficient documentation

## 2017-03-01 DIAGNOSIS — Z9049 Acquired absence of other specified parts of digestive tract: Secondary | ICD-10-CM | POA: Diagnosis not present

## 2017-03-01 DIAGNOSIS — M4686 Other specified inflammatory spondylopathies, lumbar region: Secondary | ICD-10-CM | POA: Insufficient documentation

## 2017-03-01 DIAGNOSIS — M47816 Spondylosis without myelopathy or radiculopathy, lumbar region: Secondary | ICD-10-CM

## 2017-03-01 DIAGNOSIS — Z9889 Other specified postprocedural states: Secondary | ICD-10-CM | POA: Diagnosis not present

## 2017-03-01 DIAGNOSIS — M4696 Unspecified inflammatory spondylopathy, lumbar region: Secondary | ICD-10-CM

## 2017-03-01 MED ORDER — LIDOCAINE HCL (PF) 1 % IJ SOLN
5.0000 mL | Freq: Once | INTRAMUSCULAR | Status: AC
  Administered 2017-03-01: 5 mL via SUBCUTANEOUS

## 2017-03-01 MED ORDER — IOPAMIDOL (ISOVUE-M 200) INJECTION 41%
INTRAMUSCULAR | Status: AC
  Filled 2017-03-01: qty 10

## 2017-03-01 MED ORDER — LIDOCAINE HCL (PF) 1 % IJ SOLN
INTRAMUSCULAR | Status: AC
  Filled 2017-03-01: qty 5

## 2017-03-01 MED ORDER — TRIAMCINOLONE ACETONIDE 40 MG/ML IJ SUSP
40.0000 mg | Freq: Once | INTRAMUSCULAR | Status: AC
  Administered 2017-03-01: 40 mg

## 2017-03-01 MED ORDER — SODIUM CHLORIDE 0.9 % IJ SOLN
INTRAMUSCULAR | Status: AC
  Filled 2017-03-01: qty 10

## 2017-03-01 MED ORDER — ROPIVACAINE HCL 2 MG/ML IJ SOLN
10.0000 mL | Freq: Once | INTRAMUSCULAR | Status: AC
  Administered 2017-03-01: 1 mL via EPIDURAL

## 2017-03-01 MED ORDER — IOPAMIDOL (ISOVUE-M 200) INJECTION 41%
20.0000 mL | Freq: Once | INTRAMUSCULAR | Status: DC | PRN
  Administered 2017-03-01: 10 mL
  Filled 2017-03-01: qty 20

## 2017-03-01 MED ORDER — ROPIVACAINE HCL 2 MG/ML IJ SOLN
INTRAMUSCULAR | Status: AC
  Filled 2017-03-01: qty 10

## 2017-03-01 MED ORDER — TRIAMCINOLONE ACETONIDE 40 MG/ML IJ SUSP
INTRAMUSCULAR | Status: AC
  Filled 2017-03-01: qty 1

## 2017-03-01 MED ORDER — SODIUM CHLORIDE 0.9% FLUSH
10.0000 mL | Freq: Once | INTRAVENOUS | Status: AC
  Administered 2017-03-01: 10 mL

## 2017-03-01 NOTE — Progress Notes (Signed)
Subjective:  Patient ID: Cassidy Hernandez, female    DOB: 1941-03-27  Age: 76 y.o. MRN: [id number]  CC: Back Pain (low) and Hip Pain (left)   Service Provided on Last Visit: Procedure (lesi) Procedure: L3 4 epidural steroid No. 2 under fluoroscopic guidance with moderate sedation  Previous PROCEDURE:L5-S1 epidural steroid under fluoroscopic as moderate sedation #1  HPI Cassidy Hernandez presents today for a return visit. At her last visit she had an L5-S1 epidural steroid injection that did help with her left hip and low back pain. She is no longer experiencing significant left lower extremity pain or calf pain and this seems to have improved. Her primary complaint today is left lower back pain with radiation into the left hip. This is similar to what she has originally presented with. This is been problematic for her despite conservative management and stretching and strengthening do not seem to help. Medication management Has been insufficient. Otherwise no change in bowel or bladder function or lower extremity strength or function is noted at this time.   By history she is a pleasant 76 year old white female with long-standing history of low back pain for 2 years. She describes a pain that starts in the left lower back with radiation in the posterior and lateral left leg radiating into the calf. The pain seems to be worse with standing or walking and has gradually gotten worse over the past several months. Her maximum pain score is an 8 and best a 1. It is worse with activity and aggravated by factor such as standing and walking. Alleviating factors include rest and relaxation. The quality of the pain is described as a shooting throbbing pain primarily in the left lumbar region radiating into the left calf with cramping associated. She has not experienced any numbness or tingling or weakness or problems with bowel or bladder dysfunction. 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   She has a past surgical history that includes Cholecystectomy; Abdominal hysterectomy; Cardiac catheterization (12/24/2014); Colonoscopy (N/A, 02/11/2016); and Augmentation mammaplasty (Bilateral, 1980).   Her family history includes Heart attack in her father and mother; Heart disease in her father and mother.She reports that she has never smoked. She has never used smokeless tobacco. Her alcohol and drug histories are not on file.    No results found for: TOXASSSELUR  Outpatient Medications Prior to Visit  Medication Sig Dispense Refill  . amLODipine (NORVASC) 5 MG tablet Take by mouth daily.     [name] aspirin EC 81 MG tablet Take by mouth daily.     [name] atenolol (TENORMIN) 50 MG tablet Take by mouth 2 (two) times daily.     . chlorthalidone (HYGROTON) 25 MG tablet TAKE 1/2 TABLET EVERY DAY    . losartan (COZAAR) 100 MG tablet Take 100 mg by mouth daily.     . Multiple Vitamin (MULTIVITAMIN) capsule Take by mouth daily.     . nitroGLYCERIN (NITROSTAT) 0.4 MG SL tablet Place under the tongue.    [name] omeprazole (PRILOSEC) 20 MG capsule Take by mouth daily.     . rosuvastatin (CRESTOR) 20 MG tablet Take by mouth daily.     [name]  zolpidem (AMBIEN) 10 MG tablet Take by mouth at bedtime as needed.      No facility-administered medications prior to visit.    Lab Results  Component Value Date   WBC 13.2 (H) 04/19/2014   HGB 15.3 04/19/2014   HCT 46.4 04/19/2014   PLT 289 04/19/2014   GLUCOSE 131 (H) 04/19/2014   ALT 71 (H) 04/19/2014   AST 26 04/19/2014   NA 136 04/19/2014   K 3.5 04/19/2014   CL 102 04/19/2014   CREATININE 1.09  04/19/2014   BUN 21 (H) 04/19/2014   CO2 26 04/19/2014    --------------------------------------------------------------------------------------------------------------------- Dg C-arm 1-60 Min-no Report  Result Date: 01/05/2017 Fluoroscopy was utilized by the requesting physician.  No radiographic interpretation.       ---------------------------------------------------------------------------------------------------------------------- Past Medical History:  Diagnosis Date  . 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 Degenerative arthritis   . GERD (gastroesophageal reflux disease)   . H/O: hysterectomy   . Hypertension   . Kidney stone   . Subclavian steal syndrome     Past Surgical History:  Procedure Laterality Date  . ABDOMINAL HYSTERECTOMY    . AUGMENTATION MAMMAPLASTY Bilateral 1980  . 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 CHOLECYSTECTOMY    . COLONOSCOPY N/A 02/11/2016   Procedure: COLONOSCOPY;  Surgeon: Manya Silvas, MD;  Location: [facility] ENDOSCOPY;  Service: Endoscopy;  Laterality: N/A;    Family History  Problem Relation Age of Onset  . Heart attack Mother   . Heart disease Mother   . Heart attack Father   . Heart disease Father   . Breast cancer Neg Hx     Social History  Substance Use Topics  . Smoking status: Never Smoker  . Smokeless tobacco: Never Used  . Alcohol use Not on file    ---------------------------------------------------------------------------------------------------------------------  BP 118/80   Pulse 61   Temp 98.6 F (37 C) (Oral)   Resp 16   Ht 5\' 3"  (1.6 m)   Wt 180 lb (81.6 kg)   SpO2 98%   BMI 31.89 kg/m    BP Readings from Last 3 Encounters:  03/01/17 118/80  01/05/17 (!) 154/74  01/04/17 (!) 160/64     Wt Readings from Last 3 Encounters:  03/01/17 180 lb (81.6 kg)  01/05/17 180 lb (81.6 kg)  01/04/17 180 lb (81.6 kg)      ----------------------------------------------------------------------------------------------------------------------  ROS Review of Systems  Cardiac: No chest pain or ectopy GI: No constipation  Objective:  BP 118/80   Pulse 61   Temp 98.6 F (37 C) (Oral)   Resp 16   Ht 5\' 3"  (1.6 m)   Wt 180 lb (81.6 kg)   SpO2 98%   BMI 31.89 kg/m   Physical Exam Lungs are clear to auscultation Heart is regular rate and rhythm Inspection low back reveals some paraspinous muscle tenderness and some pain with extension in standing and left lateral rotation. Her muscle tone and bulk is unchanged.    Assessment & Plan:   Kaylean was seen today for back pain and hip pain.  Diagnoses and all orders for this visit:  DDD (degenerative disc disease), lumbar  Sciatica of left side -     Lumbar Epidural Injection -     triamcinolone acetonide (KENALOG-40) injection 40 mg; 1 mL (40 mg total) by Other route once. -     sodium chloride flush (NS) 0.9 % injection 10 mL; 10 mLs by Other route once. -  ropivacaine (PF) 2 mg/mL (0.2%) (NAROPIN) injection 10 mL; 10 mLs by Epidural route once. -     lidocaine (PF) (XYLOCAINE) 1 % injection 5 mL; Inject 5 mLs into the skin once. -     iopamidol (ISOVUE-M) 41 % intrathecal injection 20 mL; 20 mLs by Other route once as needed for contrast.  Facet arthritis of lumbar region ([place]) -     LUMBAR FACET(MEDIAL BRANCH NERVE BLOCK) MBNB; Future  Chronic pain syndrome     ----------------------------------------------------------------------------------------------------------------------  Problem List Items Addressed This Visit    None    Visit Diagnoses    DDD (degenerative disc disease), lumbar    -  Primary   Relevant Medications   triamcinolone acetonide (KENALOG-40) injection 40 mg (Completed)   Sciatica of left side       Relevant Medications   triamcinolone acetonide (KENALOG-40) injection 40 mg (Completed)   sodium chloride flush  (NS) 0.9 % injection 10 mL (Completed)   ropivacaine (PF) 2 mg/mL (0.2%) (NAROPIN) injection 10 mL (Completed)   lidocaine (PF) (XYLOCAINE) 1 % injection 5 mL (Completed)   iopamidol (ISOVUE-M) 41 % intrathecal injection 20 mL   Facet arthritis of lumbar region ([facility])       Relevant Medications   triamcinolone acetonide (KENALOG-40) injection 40 mg (Completed)   Other Relevant Orders   LUMBAR FACET(MEDIAL BRANCH NERVE BLOCK) MBNB   Chronic pain syndrome          ----------------------------------------------------------------------------------------------------------------------  1. Chronic pain syndrome   2. DDD (degenerative disc disease), lumbar She continues to have persistent pain in the left hip and buttock region. I'm going to perform a L3-4 epidural steroid today to see if this can get her better coverage and reduce the inflammation of the nerve roots. Much of her symptom complex seems to reside in the L2-L3 region.  3. Facet arthritis of lumbar region [facility]) We'll have her return to clinic in 1 month for reevaluation and we may proceed with a diagnostic lumbar facet block at that time. About over the risks and benefits of the procedure with her in full detail and all of her questions about answered. In the meantime on her to continue with her current medication regimen.   4. Sciatica of left side  - Lumbar Epidural Injection; Future    ----------------------------------------------------------------------------------------------------------------------  I am having Ms. Litchford maintain her amLODipine, aspirin EC, atenolol, chlorthalidone, losartan, multivitamin, nitroGLYCERIN, omeprazole, rosuvastatin, and zolpidem. We administered triamcinolone acetonide, sodium chloride flush, ropivacaine (PF) 2 mg/mL (0.2%), lidocaine (PF), and iopamidol.   Meds ordered this encounter  Medications  . triamcinolone acetonide (KENALOG-40) injection 40 mg  . sodium chloride flush (NS) 0.9  % injection 10 mL  . ropivacaine (PF) 2 mg/mL (0.2%) (NAROPIN) injection 10 mL  . lidocaine (PF) (XYLOCAINE) 1 % injection 5 mL  . iopamidol (ISOVUE-M) 41 % intrathecal injection 20 mL  Lumbar epidural steroid at L3-4 under fluoroscopic guidance without sedation.   Procedure: L3-L4 LESI with fluoroscopic guidance and moderate sedation  NOTE: The risks, benefits, and expectations of the procedure have been discussed and explained to the patient who was understanding and in agreement with suggested treatment plan. No guarantees were made.  DESCRIPTION OF PROCEDURE: Lumbar epidural steroid injection with no IV Versed, EKG, blood pressure, pulse, and pulse oximetry monitoring. The procedure was performed with the patient in the prone position under fluoroscopic guidance. I injected subcutaneous lidocaine overlying the L3-L4 site after its fluoroscopic identifictation.  Using strict aseptic technique, I then advanced  an 18-gauge Tuohy epidural needle in the midline using interlaminar approach via loss-of-resistance to saline technique. There was negative aspiration for heme or  CSF.  I then confirmed position with both AP and Lateral fluoroscan. 2 cc of Isovue were injected and a  total of 5 mL of Preservative-Free normal saline mixed with 40 mg of Kenalog and 1cc Ropicaine 0.2 percent were injected incrementally via the  epidurally placed needle. The needle was removed. The patient tolerated the injection well and was convalesced and discharged to home in stable condition. Should the patient have any post procedure difficulty they have been instructed on how to contact us for assistance.    Follow-up: Return for evaluation, procedure.    Molli Barrows, MD 3:46 PM  The [place] practitioner database for opioid medications on this patient has been reviewed by me and my staff   Greater than 50% of the total encounter time was spent in counseling and / or coordination of care.     This dictation was  performed utilizing Systems analyst.  Please excuse any unintentional or mistaken typographical errors as a result.

## 2017-03-01 NOTE — Patient Instructions (Signed)
Facet Joint Block The facet joints connect the bones of the spine (vertebrae). They make it possible for you to bend, twist, and make other movements with your spine. They also keep you from bending too far, twisting too far, and making other excessive movements. A facet joint block is a procedure where a numbing medicine (anesthetic) is injected into a facet joint. Often, a type of anti-inflammatory medicine called a steroid is also injected. A facet joint block may be done to diagnose neck or back pain. If the pain gets better after a facet joint block, it means the pain is probably coming from the facet joint. If the pain does not get better, it means the pain is probably not coming from the facet joint. A facet joint block may also be done to relieve neck or back pain caused by an inflamed facet joint. A facet joint block is only done to relieve pain if the pain does not improve with other methods, such as medicine, exercise programs, and physical therapy. Tell a health care provider about:  Any allergies you have.  All medicines you are taking, including vitamins, herbs, eye drops, creams, and over-the-counter medicines.  Any problems you or family members have had with anesthetic medicines.  Any blood disorders you have.  Any surgeries you have had.  Any medical conditions you have.  Whether you are pregnant or may be pregnant. What are the risks? Generally, this is a safe procedure. However, problems may occur, including:  Bleeding.  Injury to a nerve near the injection site.  Pain at the injection site.  Weakness or numbness in areas controlled by nerves near the injection site.  Infection.  Temporary fluid retention.  Allergic reactions to medicines or dyes.  Injury to other structures or organs near the injection site.  What happens before the procedure?  Follow instructions from your health care provider about eating or drinking restrictions.  Ask your health care  provider about: ? Changing or stopping your regular medicines. This is especially important if you are taking diabetes medicines or blood thinners. ? Taking medicines such as aspirin and ibuprofen. These medicines can thin your blood. Do not take these medicines before your procedure if your health care provider instructs you not to.  Do not take any new dietary supplements or medicines without asking your health care provider first.  Plan to have someone take you home after the procedure. What happens during the procedure?  You may need to remove your clothing and dress in an open-back gown.  The procedure will be done while you are lying on an X-ray table. You will most likely be asked to lie on your stomach, but you may be asked to lie in a different position if an injection will be made in your neck.  Machines will be used to monitor your oxygen levels, heart rate, and blood pressure.  If an injection will be made in your neck, an IV tube will be inserted into one of your veins. Fluids and medicine will flow directly into your body through the IV tube.  The area over the facet joint where the injection will be made will be cleaned with soap. The surrounding skin will be covered with clean drapes.  A numbing medicine (local anesthetic) will be applied to your skin. Your skin may sting or burn for a moment.  A video X-ray machine (fluoroscopy) will be used to locate the joint. In some cases, a CT scan may be used.  A  contrast dye may be injected into the facet joint area to help locate the joint.  When the joint is located, an anesthetic will be injected into the joint through the needle.  Your health care provider will ask you whether you feel pain relief. If you do feel relief, a steroid may be injected to provide pain relief for a longer period of time. If you do not feel relief or feel only partial relief, additional injections of an anesthetic may be made in other facet  joints.  The needle will be removed.  Your skin will be cleaned.  A bandage (dressing) will be applied over each injection site. The procedure may vary among health care providers and hospitals. What happens after the procedure?  You will be observed for 15-30 minutes before being allowed to go home. This information is not intended to replace advice given to you by your health care provider. Make sure you discuss any questions you have with your health care provider. Document Released: 12/14/2006 Document Revised: 08/26/2015 Document Reviewed: 04/20/2015 [employer] Interactive Patient Education  2018 [employer] GENERAL RISKS AND COMPLICATIONS  What are the risk, side effects and possible complications? Generally speaking, most procedures are safe.  However, with any procedure there are risks, side effects, and the possibility of complications.  The risks and complications are dependent upon the sites that are lesioned, or the type of nerve block to be performed.  The closer the procedure is to the spine, the more serious the risks are.  Great care is taken when placing the radio frequency needles, block needles or lesioning probes, but sometimes complications can occur. 1. Infection: Any time there is an injection through the skin, there is a risk of infection.  This is why sterile conditions are used for these blocks.  There are four possible types of infection. 1. Localized skin infection. 2. Central Nervous System Infection-This can be in the form of Meningitis, which can be deadly. 3. Epidural Infections-This can be in the form of an epidural abscess, which can cause pressure inside of the spine, causing compression of the spinal cord with subsequent paralysis. This would require an emergency surgery to decompress, and there are no guarantees that the patient would recover from the paralysis. 4. Discitis-This is an infection of the intervertebral discs.  It occurs in about 1% of  discography procedures.  It is difficult to treat and it may lead to surgery.        2. Pain: the needles have to go through skin and soft tissues, will cause soreness.       3. Damage to internal structures:  The nerves to be lesioned may be near blood vessels or    other nerves which can be potentially damaged.       4. Bleeding: Bleeding is more common if the patient is taking blood thinners such as  aspirin, Coumadin, Ticiid, Plavix, etc., or if he/she have some genetic predisposition  such as hemophilia. Bleeding into the spinal canal can cause compression of the spinal  cord with subsequent paralysis.  This would require an emergency surgery to  decompress and there are no guarantees that the patient would recover from the  paralysis.       5. Pneumothorax:  Puncturing of a lung is a possibility, every time a needle is introduced in  the area of the chest or upper back.  Pneumothorax refers to free air around the  collapsed lung(s), inside of the thoracic cavity (chest cavity).    Another two possible  complications related to a similar event would include: Hemothorax and Chylothorax.   These are variations of the Pneumothorax, where instead of air around the collapsed  lung(s), you may have blood or chyle, respectively.       6. Spinal headaches: They may occur with any procedures in the area of the spine.       7. Persistent CSF (Cerebro-Spinal Fluid) leakage: This is a rare problem, but may occur  with prolonged intrathecal or epidural catheters either due to the formation of a fistulous  track or a dural tear.       8. Nerve damage: By working so close to the spinal cord, there is always a possibility of  nerve damage, which could be as serious as a permanent spinal cord injury with  paralysis.       9. Death:  Although rare, severe deadly allergic reactions known as "Anaphylactic  reaction" can occur to any of the medications used.      10. Worsening of the symptoms:  We can always make thing  worse.  What are the chances of something like this happening? Chances of any of this occuring are extremely low.  By statistics, you have more of a chance of getting killed in a motor vehicle accident: while driving to the hospital than any of the above occurring .  Nevertheless, you should be aware that they are possibilities.  In general, it is similar to taking a shower.  Everybody knows that you can slip, hit your head and get killed.  Does that mean that you should not shower again?  Nevertheless always keep in mind that statistics do not mean anything if you happen to be on the wrong side of them.  Even if a procedure has a 1 (one) in a 1,000,000 (million) chance of going wrong, it you happen to be that one..Also, keep in mind that by statistics, you have more of a chance of having something go wrong when taking medications.  Who should not have this procedure? If you are on a blood thinning medication (e.g. Coumadin, Plavix, see list of "Blood Thinners"), or if you have an active infection going on, you should not have the procedure.  If you are taking any blood thinners, please inform your physician.  How should I prepare for this procedure?  Do not eat or drink anything at least six hours prior to the procedure.  Bring a driver with you .  It cannot be a taxi.  Come accompanied by an adult that can drive you back, and that is strong enough to help you if your legs get weak or numb from the local anesthetic.  Take all of your medicines the morning of the procedure with just enough water to swallow them.  If you have diabetes, make sure that you are scheduled to have your procedure done first thing in the morning, whenever possible.  If you have diabetes, take only half of your insulin dose and notify our nurse that you have done so as soon as you arrive at the clinic.  If you are diabetic, but only take blood sugar pills (oral hypoglycemic), then do not take them on the morning of your  procedure.  You may take them after you have had the procedure.  Do not take aspirin or any aspirin-containing medications, at least eleven (11) days prior to the procedure.  They may prolong bleeding.  Wear loose fitting clothing that may be easy to   take off and that you would not mind if it got stained with Betadine or blood.  Do not wear any jewelry or perfume  Remove any nail coloring.  It will interfere with some of our monitoring equipment.  NOTE: Remember that this is not meant to be interpreted as a complete list of all possible complications.  Unforeseen problems may occur.  BLOOD THINNERS The following drugs contain aspirin or other products, which can cause increased bleeding during surgery and should not be taken for 2 weeks prior to and 1 week after surgery.  If you should need take something for relief of minor pain, you may take acetaminophen which is found in Tylenol,m Datril, Anacin-3 and Panadol. It is not blood thinner. The products listed below are.  Do not take any of the products listed below in addition to any listed on your instruction sheet.  A.P.C or A.P.C with Codeine Codeine Phosphate Capsules #3 Ibuprofen Ridaura  ABC compound Congesprin Imuran rimadil  Advil Cope Indocin Robaxisal  Alka-Seltzer Effervescent Pain Reliever and Antacid Coricidin or Coricidin-D  Indomethacin Rufen  Alka-Seltzer plus Cold Medicine Cosprin Ketoprofen S-A-C Tablets  Anacin Analgesic Tablets or Capsules Coumadin Korlgesic Salflex  Anacin Extra Strength Analgesic tablets or capsules CP-2 Tablets Lanoril Salicylate  Anaprox Cuprimine Capsules Levenox Salocol  Anexsia-D Dalteparin Magan Salsalate  Anodynos Darvon compound Magnesium Salicylate Sine-off  Ansaid Dasin Capsules Magsal Sodium Salicylate  Anturane Depen Capsules Marnal Soma  APF Arthritis pain formula Dewitt's Pills Measurin Stanback  Argesic Dia-Gesic Meclofenamic Sulfinpyrazone  Arthritis Bayer Timed Release Aspirin  Diclofenac Meclomen Sulindac  Arthritis pain formula Anacin Dicumarol Medipren Supac  Analgesic (Safety coated) Arthralgen Diffunasal Mefanamic Suprofen  Arthritis Strength Bufferin Dihydrocodeine Mepro Compound Suprol  Arthropan liquid Dopirydamole Methcarbomol with Aspirin Synalgos  ASA tablets/Enseals Disalcid Micrainin Tagament  Ascriptin Doan's Midol Talwin  Ascriptin A/D Dolene Mobidin Tanderil  Ascriptin Extra Strength Dolobid Moblgesic Ticlid  Ascriptin with Codeine Doloprin or Doloprin with Codeine Momentum Tolectin  Asperbuf Duoprin Mono-gesic Trendar  Aspergum Duradyne Motrin or Motrin IB Triminicin  Aspirin plain, buffered or enteric coated Durasal Myochrisine Trigesic  Aspirin Suppositories Easprin Nalfon Trillsate  Aspirin with Codeine Ecotrin Regular or Extra Strength Naprosyn Uracel  Atromid-S Efficin Naproxen Ursinus  Auranofin Capsules Elmiron Neocylate Vanquish  Axotal Emagrin Norgesic Verin  Azathioprine Empirin or Empirin with Codeine Normiflo Vitamin E  Azolid Emprazil Nuprin Voltaren  Bayer Aspirin plain, buffered or children's or timed BC Tablets or powders Encaprin Orgaran Warfarin Sodium  Buff-a-Comp Enoxaparin Orudis Zorpin  Buff-a-Comp with Codeine Equegesic Os-Cal-Gesic   Buffaprin Excedrin plain, buffered or Extra Strength Oxalid   Bufferin Arthritis Strength Feldene Oxphenbutazone   Bufferin plain or Extra Strength Feldene Capsules Oxycodone with Aspirin   Bufferin with Codeine Fenoprofen Fenoprofen Pabalate or Pabalate-SF   Buffets II Flogesic Panagesic   Buffinol plain or Extra Strength Florinal or Florinal with Codeine Panwarfarin   Buf-Tabs Flurbiprofen Penicillamine   Butalbital Compound Four-way cold tablets Penicillin   Butazolidin Fragmin Pepto-Bismol   Carbenicillin Geminisyn Percodan   Carna Arthritis Reliever Geopen Persantine   Carprofen Gold's salt Persistin   Chloramphenicol Goody's Phenylbutazone   Chloromycetin Haltrain Piroxlcam    Clmetidine heparin Plaquenil   Cllnoril Hyco-pap Ponstel   Clofibrate Hydroxy chloroquine Propoxyphen         Before stopping any of these medications, be sure to consult the physician who ordered them.  Some, such as Coumadin (Warfarin) are ordered to prevent or treat serious conditions such as "deep thrombosis", "pumonary   embolisms", and other heart problems.  The amount of time that you may need off of the medication may also vary with the medication and the reason for which you were taking it.  If you are taking any of these medications, please make sure you notify your pain physician before you undergo any procedures.

## 2017-03-01 NOTE — Progress Notes (Signed)
Safety precautions to be maintained throughout the outpatient stay will include: orient to surroundings, keep bed in low position, maintain call bell within reach at all times, provide assistance with transfer out of bed and ambulation.  

## 2017-03-02 ENCOUNTER — Telehealth: Payer: Self-pay | Admitting: *Deleted

## 2017-03-02 NOTE — Telephone Encounter (Signed)
Attempted to call patient for post procedure follow-up. Unable to leave message.

## 2017-04-04 ENCOUNTER — Ambulatory Visit
Admission: RE | Admit: 2017-04-04 | Discharge: 2017-04-04 | Disposition: A | Discharge status: Home / Self Care | Payer: Medicare Other | Source: Ambulatory Visit | Attending: Anesthesiology | Admitting: Anesthesiology

## 2017-04-04 ENCOUNTER — Encounter: Payer: Self-pay | Admitting: Anesthesiology

## 2017-04-04 ENCOUNTER — Other Ambulatory Visit: Payer: Self-pay | Admitting: Anesthesiology

## 2017-04-04 ENCOUNTER — Ambulatory Visit (HOSPITAL_BASED_OUTPATIENT_CLINIC_OR_DEPARTMENT_OTHER): Payer: Medicare Other | Admitting: Anesthesiology

## 2017-04-04 VITALS — BP 134/63 | HR 71 | Temp 97.5°F | Resp 16 | Ht 63.0 in | Wt 180.0 lb

## 2017-04-04 DIAGNOSIS — I10 Essential (primary) hypertension: Secondary | ICD-10-CM | POA: Insufficient documentation

## 2017-04-04 DIAGNOSIS — Z8249 Family history of ischemic heart disease and other diseases of the circulatory system: Secondary | ICD-10-CM | POA: Insufficient documentation

## 2017-04-04 DIAGNOSIS — M47816 Spondylosis without myelopathy or radiculopathy, lumbar region: Secondary | ICD-10-CM | POA: Insufficient documentation

## 2017-04-04 DIAGNOSIS — M5136 Other intervertebral disc degeneration, lumbar region: Secondary | ICD-10-CM | POA: Insufficient documentation

## 2017-04-04 DIAGNOSIS — M4696 Unspecified inflammatory spondylopathy, lumbar region: Secondary | ICD-10-CM | POA: Diagnosis not present

## 2017-04-04 DIAGNOSIS — M545 Low back pain: Secondary | ICD-10-CM | POA: Diagnosis present

## 2017-04-04 DIAGNOSIS — Z79899 Other long term (current) drug therapy: Secondary | ICD-10-CM | POA: Diagnosis not present

## 2017-04-04 DIAGNOSIS — M5432 Sciatica, left side: Secondary | ICD-10-CM | POA: Insufficient documentation

## 2017-04-04 DIAGNOSIS — R52 Pain, unspecified: Secondary | ICD-10-CM

## 2017-04-04 DIAGNOSIS — Z87442 Personal history of urinary calculi: Secondary | ICD-10-CM | POA: Insufficient documentation

## 2017-04-04 DIAGNOSIS — K219 Gastro-esophageal reflux disease without esophagitis: Secondary | ICD-10-CM | POA: Diagnosis not present

## 2017-04-04 DIAGNOSIS — I6523 Occlusion and stenosis of bilateral carotid arteries: Secondary | ICD-10-CM | POA: Diagnosis not present

## 2017-04-04 DIAGNOSIS — Z7982 Long term (current) use of aspirin: Secondary | ICD-10-CM | POA: Insufficient documentation

## 2017-04-04 DIAGNOSIS — G894 Chronic pain syndrome: Secondary | ICD-10-CM | POA: Diagnosis not present

## 2017-04-04 DIAGNOSIS — M1990 Unspecified osteoarthritis, unspecified site: Secondary | ICD-10-CM | POA: Diagnosis not present

## 2017-04-04 MED ORDER — MIDAZOLAM HCL 5 MG/5ML IJ SOLN
5.0000 mg | Freq: Once | INTRAMUSCULAR | Status: AC
  Administered 2017-04-04: 1.5 mg via INTRAVENOUS
  Filled 2017-04-04: qty 5

## 2017-04-04 MED ORDER — LIDOCAINE HCL (PF) 1 % IJ SOLN
5.0000 mL | Freq: Once | INTRAMUSCULAR | Status: AC
  Administered 2017-04-04: 5 mL via SUBCUTANEOUS
  Filled 2017-04-04: qty 5

## 2017-04-04 MED ORDER — ROPIVACAINE HCL 2 MG/ML IJ SOLN
10.0000 mL | Freq: Once | INTRAMUSCULAR | Status: AC
  Administered 2017-04-04: 9 mL via EPIDURAL
  Filled 2017-04-04: qty 10

## 2017-04-04 MED ORDER — LACTATED RINGERS IV SOLN
1000.0000 mL | INTRAVENOUS | Status: DC
  Administered 2017-04-04: 1000 mL via INTRAVENOUS

## 2017-04-04 MED ORDER — LIDOCAINE HCL 2 % IJ SOLN
INTRAMUSCULAR | Status: AC
  Filled 2017-04-04: qty 20

## 2017-04-04 MED ORDER — SODIUM CHLORIDE 0.9% FLUSH: 10.0000 mL | Freq: Once | INTRAVENOUS | Status: DC

## 2017-04-04 MED ORDER — TRIAMCINOLONE ACETONIDE 40 MG/ML IJ SUSP
40.0000 mg | Freq: Once | INTRAMUSCULAR | Status: AC
  Administered 2017-04-04: 40 mg
  Filled 2017-04-04: qty 1

## 2017-04-04 NOTE — Progress Notes (Signed)
Subjective:  Patient ID: Cassidy Hernandez, female    DOB: May 05, 1941  Age: 76 y.o. MRN: [id number]  CC: Back Pain (low, bilateral)   Service Provided on Last Visit: Procedure (LESI) Procedure: Left side L2-3 L3-4 L4-5 L5-S1 medial branch block to the facets under fluoroscopic guidance with moderate sedation  Previous procedure L3 4 epidural steroid No. 2 under fluoroscopic guidance with moderate sedation  Previous PROCEDURE:L5-S1 epidural steroid under fluoroscopic as moderate sedation #1  HPI Mrs. Rogoff presents for reevaluation last seen a few months ago. At that point she had an L3-4 epidural that seems to have given her good relief in her lower extremity pain. She is no longer experiencing the gnawing aching cramping pain in her left leg. She still having low back pain worse on the left than right side. This is worse with prolonged standing and as the day progresses. She occasionally takes medications for pain relief but generally relies on rest for the pain to resolve. It primarily radiates into the left hip and posterior buttock and is worse with standing or prolonged walking. The strength in her lower extremities appears to be at baseline.   By history she is a pleasant 76 year old white female with long-standing history of low back pain for 2 years. She describes a pain that starts in the left lower back with radiation in the posterior and lateral left leg radiating into the calf. The pain seems to be worse with standing or walking and has gradually gotten worse over the past several months. Her maximum pain score is an 8 and best a 1. It is worse with activity and aggravated by factor such as standing and walking. Alleviating factors include rest and relaxation. The quality of the pain is described as a shooting throbbing pain primarily in the left lumbar region radiating into the left calf with cramping associated. She has not experienced any numbness or tingling or weakness or problems with  bowel or bladder dysfunction. 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   She has a past surgical history that includes Cholecystectomy; Abdominal hysterectomy; Cardiac catheterization (12/24/2014); Colonoscopy (N/A, 02/11/2016); and Augmentation mammaplasty (Bilateral, 1980).   Her family history includes Heart attack in her father and mother; Heart disease in her father and mother.She reports that she has never smoked. She has never used smokeless tobacco. Her alcohol and drug histories are not on file.    No results found for: TOXASSSELUR  Outpatient Medications Prior to Visit  Medication Sig Dispense Refill  . amLODipine (NORVASC) 5 MG tablet Take by mouth daily.     [name] aspirin EC 81 MG tablet Take by mouth daily.     [name] atenolol (TENORMIN) 50 MG tablet Take by mouth 2 (two) times daily.     . chlorthalidone (HYGROTON) 25 MG tablet TAKE 1/2 TABLET EVERY DAY    . losartan (COZAAR) 100 MG tablet Take 100 mg by mouth daily.     . Multiple Vitamin (MULTIVITAMIN) capsule Take by mouth daily.     . nitroGLYCERIN (NITROSTAT) 0.4 MG SL tablet Place under the tongue.    [name] omeprazole (PRILOSEC) 20 MG capsule Take by  mouth daily.     . rosuvastatin (CRESTOR) 20 MG tablet Take by mouth daily.     [name] zolpidem (AMBIEN) 10 MG tablet Take by mouth at bedtime as needed.      No facility-administered medications prior to visit.    Lab Results  Component Value Date   WBC 13.2 (H) 04/19/2014   HGB 15.3 04/19/2014   HCT 46.4 04/19/2014   PLT 289 04/19/2014   GLUCOSE 131 (H) 04/19/2014   ALT 71 (H) 04/19/2014   AST 26  04/19/2014   NA 136 04/19/2014   K 3.5 04/19/2014   CL 102 04/19/2014   CREATININE 1.09 04/19/2014   BUN 21 (H) 04/19/2014   CO2 26 04/19/2014    --------------------------------------------------------------------------------------------------------------------- Dg C-arm 1-60 Min-no Report  Result Date: 01/05/2017 Fluoroscopy was utilized by the requesting physician.  No radiographic interpretation.       ---------------------------------------------------------------------------------------------------------------------- Past Medical History:  Diagnosis Date  . 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 Degenerative arthritis   . GERD (gastroesophageal reflux disease)   . H/O: hysterectomy   . Hypertension   . Kidney stone   . Subclavian steal syndrome     Past Surgical History:  Procedure Laterality Date  . ABDOMINAL HYSTERECTOMY    . AUGMENTATION MAMMAPLASTY Bilateral 1980  . 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 CHOLECYSTECTOMY    . COLONOSCOPY N/A 02/11/2016   Procedure: COLONOSCOPY;  Surgeon: Manya Silvas, MD;  Location: [facility] ENDOSCOPY;  Service: Endoscopy;  Laterality: N/A;    Family History  Problem Relation Age of Onset  . Heart attack Mother   . Heart disease Mother   . Heart attack Father   . Heart disease Father   . Breast cancer Neg Hx     Social History  Substance Use Topics  . Smoking status: Never Smoker  . Smokeless tobacco: Never Used  . Alcohol use Not on file    ---------------------------------------------------------------------------------------------------------------------  BP 123/83   Pulse 71   Temp (!) 97.5 F (36.4 C)   Resp 20   Ht 5\' 3"  (1.6 m)   Wt 180 lb (81.6 kg)   SpO2 95%   BMI 31.89 kg/m    BP Readings from Last 3 Encounters:  04/04/17 123/83  03/01/17 118/80  01/05/17 (!) 154/74     Wt Readings from Last 3 Encounters:  04/04/17 180 lb (81.6 kg)  03/01/17 180 lb (81.6  kg)  01/05/17 180 lb (81.6 kg)     ----------------------------------------------------------------------------------------------------------------------  ROS Review of Systems  Cardiac: No chest pain or ectopy Pulmonary: No shortness of breath GI: No constipation  Objective:  BP 123/83   Pulse 71   Temp (!) 97.5 F (36.4 C)   Resp 20   Ht 5\' 3"  (1.6 m)   Wt 180 lb (81.6 kg)   SpO2 95%   BMI 31.89 kg/m   Physical Exam Lungs are clear to auscultation Heart is regular rate and rhythm Low back exam reveals some paraspinous muscle tenderness but no overt trigger points. She does have pain with extension and left lateral rotation greater than right lateral rotation in the low back. This radiates into the hip and buttock left side. Muscle tone and bulk is at baseline  Assessment & Plan:   Crytal was seen today for back pain.  Diagnoses and all orders for this visit:  Facet arthritis of lumbar region ([place]) -     [place]) MBNB -  triamcinolone acetonide (KENALOG-40) injection 40 mg; 1 mL (40 mg total) by Other route once. -     sodium chloride flush (NS) 0.9 % injection 10 mL; 10 mLs by Other route once. -     ropivacaine (PF) 2 mg/mL (0.2%) (NAROPIN) injection 10 mL; 10 mLs by Epidural route once. -     midazolam (VERSED) 5 MG/5ML injection 5 mg; Inject 5 mLs (5 mg total) into the vein once. -     lidocaine (PF) (XYLOCAINE) 1 % injection 5 mL; Inject 5 mLs into the skin once. -     lactated ringers infusion 1,000 mL; Inject 1,000 mLs into the vein continuous. -     LUMBAR FACET(MEDIAL BRANCH NERVE BLOCK) MBNB; Future  Chronic pain syndrome  Sciatica of left side  DDD (degenerative disc disease), lumbar     ----------------------------------------------------------------------------------------------------------------------  Problem List Items Addressed This Visit    None    Visit Diagnoses    Facet arthritis of lumbar region [facility])     -  Primary   Relevant Medications   triamcinolone acetonide (KENALOG-40) injection 40 mg (Completed)   sodium chloride flush (NS) 0.9 % injection 10 mL   ropivacaine (PF) 2 mg/mL (0.2%) (NAROPIN) injection 10 mL (Completed)   midazolam (VERSED) 5 MG/5ML injection 5 mg (Completed)   lidocaine (PF) (XYLOCAINE) 1 % injection 5 mL (Completed)   lactated ringers infusion 1,000 mL   Other Relevant Orders   LUMBAR FACET(MEDIAL BRANCH NERVE BLOCK) MBNB   Chronic pain syndrome       Sciatica of left side       Relevant Medications   midazolam (VERSED) 5 MG/5ML injection 5 mg (Completed)   DDD (degenerative disc disease), lumbar       Relevant Medications   triamcinolone acetonide (KENALOG-40) injection 40 mg (Completed)      ----------------------------------------------------------------------------------------------------------------------  1. Chronic pain syndromeContinue efforts at stretching strengthening   2. DDD (degenerative disc disease), lumbar Her lower extremity pain seems to be better with some resolution in her sciatica. We will defer on repeat epidural injection today.  3. Facet arthritis of lumbar region [facility] clinical findings that feel like facet genic pain is present and we will proceed with her first diagnostic lumbar facet block today. The risks and benefits of an reviewed and all questions answered. She is to return to clinic in 1 month for reevaluation and possible repeat injection at that time. 4. Sciatica of left side      ----------------------------------------------------------------------------------------------------------------------  I am having Ms. Milner maintain her amLODipine, aspirin EC, atenolol, chlorthalidone, losartan, multivitamin, nitroGLYCERIN, omeprazole, rosuvastatin, zolpidem, and losartan. We administered triamcinolone acetonide, ropivacaine (PF) 2 mg/mL (0.2%), midazolam, lidocaine (PF), and lactated ringers.   Meds ordered  this encounter  Medications  . losartan (COZAAR) 100 MG tablet    Sig: Take by mouth.  . triamcinolone acetonide (KENALOG-40) injection 40 mg  . sodium chloride flush (NS) 0.9 % injection 10 mL  . ropivacaine (PF) 2 mg/mL (0.2%) (NAROPIN) injection 10 mL  . midazolam (VERSED) 5 MG/5ML injection 5 mg  . lidocaine (PF) (XYLOCAINE) 1 % injection 5 mL  . lactated ringers infusion 1,000 mL  Procedure: Left side diagnostic L2-3 L3-4 L4-5 L5-S1 medial branch block under fluoroscopic guidance and moderate sedation   Patient was taken to the fluoroscopy suite and placed in prone position. A total dose of 1.5 mg of Versed with 0 cc of fentanyl were titrated for moderate sedation. Vital signs were stable throughout the procedure.  The  overlying area of skin on the  left side was prepped with Betadine 3 and strict aseptic technique was utilized throughout the procedure. Flouroscopy was used to I identified the areas overlying the aforementioned facets at L2-3 L3-4 L4-5 and L5-S1.   1% lidocaine 1 cc was infiltrated subcutaneously and into the fascia with a 25-gauge needle at each of these sites. I then advanced a 22-gauge 3-1/2 inch Quinckie needle with the needle tip to lie at the "[facility]" portion of the [employer] dog". There was negative aspiration for heme or CSF and  no paresthesia. I then injected 2 cc of ropivacaine 0.2% mixed with10 mg of triamcinolone at each of the aforementioned sites. These needles were withdrawn.   The patient was convalesced discharged home stable condition for follow-up as mentioned. Follow-up: Return for evaluation, procedure.    Molli Barrows, MD 2:02 PM  The [place] practitioner database for opioid medications on this patient has been reviewed by me and my staff   Greater than 50% of the total encounter time was spent in counseling and / or coordination of care.     This dictation was performed utilizing Systems analyst.  Please excuse any  unintentional or mistaken typographical errors as a result.

## 2017-04-04 NOTE — Patient Instructions (Signed)
Pain Management Discharge Instructions  General Discharge Instructions :  If you need to reach your doctor call: Monday-Friday 8:00 am - 4:00 pm at [phone] or toll free [phone].  After clinic hours [phone] to have operator reach doctor.  Bring all of your medication bottles to all your appointments in the pain clinic.  To cancel or reschedule your appointment with Pain Management please remember to call 24 hours in advance to avoid a fee.  Refer to the educational materials which you have been given on: General Risks, I had my Procedure. Discharge Instructions, Post Sedation.  Post Procedure Instructions:  The drugs you were given will stay in your system until tomorrow, so for the next 24 hours you should not drive, make any legal decisions or drink any alcoholic beverages.  You may eat anything you prefer, but it is better to start with liquids then soups and crackers, and gradually work up to solid foods.  Please notify your doctor immediately if you have any unusual bleeding, trouble breathing or pain that is not related to your normal pain.  Depending on the type of procedure that was done, some parts of your body may feel week and/or numb.  This usually clears up by tonight or the next day.  Walk with the use of an assistive device or accompanied by an adult for the 24 hours.  You may use ice on the affected area for the first 24 hours.  Put ice in a Ziploc bag and cover with a towel and place against area 15 minutes on 15 minutes off.  You may switch to heat after 24 hours.Facet Joint Block The facet joints connect the bones of the spine (vertebrae). They make it possible for you to bend, twist, and make other movements with your spine. They also keep you from bending too far, twisting too far, and making other excessive movements. A facet joint block is a procedure where a numbing medicine (anesthetic) is injected into a facet joint. Often, a type of  anti-inflammatory medicine called a steroid is also injected. A facet joint block may be done to diagnose neck or back pain. If the pain gets better after a facet joint block, it means the pain is probably coming from the facet joint. If the pain does not get better, it means the pain is probably not coming from the facet joint. A facet joint block may also be done to relieve neck or back pain caused by an inflamed facet joint. A facet joint block is only done to relieve pain if the pain does not improve with other methods, such as medicine, exercise programs, and physical therapy. Tell a health care provider about:  Any allergies you have.  All medicines you are taking, including vitamins, herbs, eye drops, creams, and over-the-counter medicines.  Any problems you or family members have had with anesthetic medicines.  Any blood disorders you have.  Any surgeries you have had.  Any medical conditions you have.  Whether you are pregnant or may be pregnant. What are the risks? Generally, this is a safe procedure. However, problems may occur, including:  Bleeding.  Injury to a nerve near the injection site.  Pain at the injection site.  Weakness or numbness in areas controlled by nerves near the injection site.  Infection.  Temporary fluid retention.  Allergic reactions to medicines or dyes.  Injury to other structures or organs near the injection site.  What happens before the procedure?  Follow instructions from your health   care provider about eating or drinking restrictions.  Ask your health care provider about: ? Changing or stopping your regular medicines. This is especially important if you are taking diabetes medicines or blood thinners. ? Taking medicines such as aspirin and ibuprofen. These medicines can thin your blood. Do not take these medicines before your procedure if your health care provider instructs you not to.  Do not take any new dietary supplements or  medicines without asking your health care provider first.  Plan to have someone take you home after the procedure. What happens during the procedure?  You may need to remove your clothing and dress in an open-back gown.  The procedure will be done while you are lying on an X-ray table. You will most likely be asked to lie on your stomach, but you may be asked to lie in a different position if an injection will be made in your neck.  Machines will be used to monitor your oxygen levels, heart rate, and blood pressure.  If an injection will be made in your neck, an IV tube will be inserted into one of your veins. Fluids and medicine will flow directly into your body through the IV tube.  The area over the facet joint where the injection will be made will be cleaned with soap. The surrounding skin will be covered with clean drapes.  A numbing medicine (local anesthetic) will be applied to your skin. Your skin may sting or burn for a moment.  A video X-ray machine (fluoroscopy) will be used to locate the joint. In some cases, a CT scan may be used.  A contrast dye may be injected into the facet joint area to help locate the joint.  When the joint is located, an anesthetic will be injected into the joint through the needle.  Your health care provider will ask you whether you feel pain relief. If you do feel relief, a steroid may be injected to provide pain relief for a longer period of time. If you do not feel relief or feel only partial relief, additional injections of an anesthetic may be made in other facet joints.  The needle will be removed.  Your skin will be cleaned.  A bandage (dressing) will be applied over each injection site. The procedure may vary among health care providers and hospitals. What happens after the procedure?  You will be observed for 15-30 minutes before being allowed to go home. This information is not intended to replace advice given to you by your health care  provider. Make sure you discuss any questions you have with your health care provider. Document Released: 12/14/2006 Document Revised: 08/26/2015 Document Reviewed: 04/20/2015 Elsevier Interactive Patient Education  2018 [place] Facet Joint Block, Care After Refer to this sheet in the next few weeks. These instructions provide you with information about caring for yourself after your procedure. Your health care provider may also give you more specific instructions. Your treatment has been planned according to current medical practices, but problems sometimes occur. Call your health care provider if you have any problems or questions after your procedure. What can I expect after the procedure? After the procedure, it is common to have:  Some tenderness over the injection sites for 2 days after the procedure.  A temporary increase in blood sugar if you have diabetes.  Follow these instructions at home:  Keep track of the amount of pain relief you feel and how long it lasts.  Take over-the-counter and prescription medicines  only as told by your health care provider. You may need to limit pain medicine within the first 4-6 hours after the procedure.  Remove your bandages (dressings) the morning after the procedure.  For the first 24 hours after the procedure: ? Do not apply heat near or over the injection sites. ? Do not take a bath or soak in water, such as in a pool or lake. ? Do not drive or operate heavy machinery unless approved by your health care provider. ? Avoid activities that require a lot of energy.  If the injection site is tender, try applying ice to the area. To do this: ? Put ice in a plastic bag. ? Place a towel between your skin and the bag. ? Leave the ice on for 20 minutes, 2-3 times a day.  Keep all follow-up visits as told by your health care provider. This is important. Contact a health care provider if:  Fluid is coming from an injection site.  There is  significant bleeding or swelling at an injection site.  You have diabetes and your blood sugar is above 180 mg/dL. Get help right away if:  You have a fever.  You have worsening pain or swelling around an injection site.  There are red streaks around an injection site.  You develop severe pain that is not controlled by your medicines.  You develop a headache, stiff neck, nausea, or vomiting.  Your eyes become very sensitive to light.  You have weakness, paralysis, or tingling in your arms or legs that was not present before the procedure.  You have difficulty urinating or breathing. This information is not intended to replace advice given to you by your health care provider. Make sure you discuss any questions you have with your health care provider. Document Released: 07/11/2012 Document Revised: 12/09/2015 Document Reviewed: 04/20/2015 Elsevier Interactive Patient Education  [employer].

## 2017-04-05 ENCOUNTER — Telehealth: Payer: Self-pay | Admitting: *Deleted

## 2017-04-05 NOTE — Telephone Encounter (Signed)
Attempted to call patient for post procedure follow-up. Unable to leave a message.

## 2017-05-10 ENCOUNTER — Encounter: Payer: Self-pay | Admitting: Anesthesiology

## 2017-05-10 ENCOUNTER — Other Ambulatory Visit: Payer: Self-pay | Admitting: Anesthesiology

## 2017-05-10 ENCOUNTER — Ambulatory Visit
Admission: RE | Admit: 2017-05-10 | Discharge: 2017-05-10 | Disposition: A | Discharge status: Home / Self Care | Payer: Medicare Other | Source: Ambulatory Visit | Attending: Anesthesiology | Admitting: Anesthesiology

## 2017-05-10 ENCOUNTER — Ambulatory Visit (HOSPITAL_BASED_OUTPATIENT_CLINIC_OR_DEPARTMENT_OTHER): Payer: Medicare Other | Admitting: Anesthesiology

## 2017-05-10 VITALS — BP 116/64 | HR 71 | Temp 98.3°F | Resp 16 | Ht 63.0 in | Wt 180.0 lb

## 2017-05-10 DIAGNOSIS — M5432 Sciatica, left side: Secondary | ICD-10-CM

## 2017-05-10 DIAGNOSIS — M545 Low back pain, unspecified: Secondary | ICD-10-CM

## 2017-05-10 DIAGNOSIS — M4696 Unspecified inflammatory spondylopathy, lumbar region: Secondary | ICD-10-CM

## 2017-05-10 DIAGNOSIS — M25552 Pain in left hip: Secondary | ICD-10-CM | POA: Insufficient documentation

## 2017-05-10 DIAGNOSIS — M5136 Other intervertebral disc degeneration, lumbar region: Secondary | ICD-10-CM | POA: Insufficient documentation

## 2017-05-10 DIAGNOSIS — R52 Pain, unspecified: Secondary | ICD-10-CM

## 2017-05-10 DIAGNOSIS — M47816 Spondylosis without myelopathy or radiculopathy, lumbar region: Secondary | ICD-10-CM

## 2017-05-10 DIAGNOSIS — G894 Chronic pain syndrome: Secondary | ICD-10-CM

## 2017-05-10 DIAGNOSIS — G8929 Other chronic pain: Secondary | ICD-10-CM

## 2017-05-10 MED ORDER — MIDAZOLAM HCL 5 MG/5ML IJ SOLN
5.0000 mg | Freq: Once | INTRAMUSCULAR | Status: AC
  Administered 2017-05-10: 2 mg via INTRAVENOUS

## 2017-05-10 MED ORDER — DEXAMETHASONE SODIUM PHOSPHATE 10 MG/ML IJ SOLN
4.0000 mg | Freq: Once | INTRAMUSCULAR | Status: AC
  Administered 2017-05-10: 4 mg

## 2017-05-10 MED ORDER — DEXAMETHASONE SODIUM PHOSPHATE 10 MG/ML IJ SOLN
INTRAMUSCULAR | Status: AC
  Filled 2017-05-10: qty 1

## 2017-05-10 MED ORDER — SODIUM CHLORIDE 0.9 % IJ SOLN
INTRAMUSCULAR | Status: AC
  Filled 2017-05-10: qty 10

## 2017-05-10 MED ORDER — TRIAMCINOLONE ACETONIDE 40 MG/ML IJ SUSP
40.0000 mg | Freq: Once | INTRAMUSCULAR | Status: AC
  Administered 2017-05-10: 40 mg

## 2017-05-10 MED ORDER — ROPIVACAINE HCL 2 MG/ML IJ SOLN
INTRAMUSCULAR | Status: AC
  Filled 2017-05-10: qty 10

## 2017-05-10 MED ORDER — LIDOCAINE HCL (PF) 1 % IJ SOLN
INTRAMUSCULAR | Status: AC
  Filled 2017-05-10: qty 5

## 2017-05-10 MED ORDER — MIDAZOLAM HCL 5 MG/5ML IJ SOLN
INTRAMUSCULAR | Status: AC
  Filled 2017-05-10: qty 5

## 2017-05-10 MED ORDER — SODIUM CHLORIDE 0.9% FLUSH: 10.0000 mL | Freq: Once | INTRAVENOUS | Status: DC

## 2017-05-10 MED ORDER — TRIAMCINOLONE ACETONIDE 40 MG/ML IJ SUSP
INTRAMUSCULAR | Status: AC
  Filled 2017-05-10: qty 1

## 2017-05-10 MED ORDER — ROPIVACAINE HCL 2 MG/ML IJ SOLN
10.0000 mL | Freq: Once | INTRAMUSCULAR | Status: AC
  Administered 2017-05-10: 10 mL via EPIDURAL

## 2017-05-10 MED ORDER — LIDOCAINE HCL (PF) 1 % IJ SOLN
5.0000 mL | Freq: Once | INTRAMUSCULAR | Status: AC
  Administered 2017-05-10: 5 mL via SUBCUTANEOUS
  Filled 2017-05-10: qty 5

## 2017-05-10 MED ORDER — LACTATED RINGERS IV SOLN
1000.0000 mL | INTRAVENOUS | Status: DC
  Administered 2017-05-10: 1000 mL via INTRAVENOUS

## 2017-05-10 NOTE — Progress Notes (Signed)
Subjective:  Patient ID: Cassidy Hernandez, female    DOB: 06/18/1941  Age: 76 y.o. MRN: [id number]  CC: Back Pain (lower) and Hip Pain (left)   Procedure: Left side medial branch block to the facets under fluoroscopic guidance at L2-3 L3-4 L4-5 L5-S1 with moderate sedation and left posterior buttock trigger point injection 1   HPI Cassidy Hernandez presents for reevaluation. She continues to have primarily left side lower back pain. This pain has been quite recalcitrant and has failed conservative therapy. She did have a previous facet block that did give her about a week to 2 weeks of relief rated at 50% improvement. Furthermore she's had previous epidural steroid injections that have been beneficial at eliminating her right side lower back pain and her left lower leg pain. Unfortunately she continues to have left-sided lower back pain it's been quite problematic. No change in quality characteristic or distribution of her noted. She's been taking over-the-counter medications as needed for pain control. She has tried to do her stretching exercises but these frequently cause an exacerbation in her left lower back pain. No change in bowel bladder function or strength are noted.  Outpatient Medications Prior to Visit  Medication Sig Dispense Refill  . amLODipine (NORVASC) 5 MG tablet Take by mouth daily.     [name] aspirin EC 81 MG tablet Take by mouth daily.     [name] atenolol (TENORMIN) 50 MG tablet Take by mouth 2 (two) times daily.     . chlorthalidone (HYGROTON) 25 MG tablet TAKE 1/2 TABLET EVERY DAY    . losartan (COZAAR) 100 MG tablet Take 100 mg by mouth daily.     [name] losartan (COZAAR) 100 MG tablet Take by mouth.    . Multiple Vitamin (MULTIVITAMIN) capsule Take by mouth daily.     . nitroGLYCERIN (NITROSTAT) 0.4 MG SL tablet Place under the tongue.    [name] omeprazole (PRILOSEC) 20 MG capsule Take by mouth daily.     . rosuvastatin (CRESTOR) 20 MG tablet Take by mouth daily.     [name] zolpidem (AMBIEN) 10 MG  tablet Take by mouth at bedtime as needed.      No facility-administered medications prior to visit.     Review of Systems CNS: No confusion or dizziness GI: No constipation or abdominal pain Cardiac: No angina or palpitations  Objective:  BP 116/64   Pulse 71   Temp 98.3 F (36.8 C) (Oral)   Resp 16   Ht 5\' 3"  (1.6 m)   Wt 180 lb (81.6 kg)   SpO2 99%   BMI 31.89 kg/m    BP Readings from Last 3 Encounters:  05/10/17 116/64  04/04/17 134/63  03/01/17 118/80     Wt Readings from Last 3 Encounters:  05/10/17 180 lb (81.6 kg)  04/04/17 180 lb (81.6 kg)  03/01/17 180 lb (81.6 kg)     Physical Exam Pt is alert and oriented PERRL EOMI HEART IS RRR no murmur or rub LCTA no wheezing or rhales MUSCULOSKELETALReveals some persistent sid reveals some persistent left-sided lower back pain with a trigger point noted in the left posterior buttocks. She has pain with extension and left lateral rotation and loading in the standing position. Her muscle tone and bulk to the lower extremities remains at baseline.  Labs  No results found for: HGBA1C Lab Results  Component Value Date   CREATININE 1.09 04/19/2014    -------------------------------------------------------------------------------------------------------------------- Lab Results  Component Value Date   WBC 13.2 (H) 04/19/2014  HGB 15.3 04/19/2014   HCT 46.4 04/19/2014   PLT 289 04/19/2014   GLUCOSE 131 (H) 04/19/2014   ALT 71 (H) 04/19/2014   AST 26 04/19/2014   NA 136 04/19/2014   K 3.5 04/19/2014   CL 102 04/19/2014   CREATININE 1.09 04/19/2014   BUN 21 (H) 04/19/2014   CO2 26 04/19/2014    --------------------------------------------------------------------------------------------------------------------- Dg C-arm 1-60 Min-no Report  Result Date: 05/10/2017 Fluoroscopy was utilized by the requesting physician.  No radiographic interpretation.     Assessment & Plan:   Cassidy Hernandez was seen today for  back pain and hip pain.  Diagnoses and all orders for this visit:  Facet arthritis of lumbar region ([place]) -     sodium chloride flush (NS) 0.9 % injection 10 mL; 10 mLs by Other route once. -     ropivacaine (PF) 2 mg/mL (0.2%) (NAROPIN) injection 10 mL; 10 mLs by Epidural route once. -     midazolam (VERSED) 5 MG/5ML injection 5 mg; Inject 5 mLs (5 mg total) into the vein once. -     lidocaine (PF) (XYLOCAINE) 1 % injection 5 mL; Inject 5 mLs into the skin once. -     lactated ringers infusion 1,000 mL; Inject 1,000 mLs into the vein continuous. -     dexamethasone (DECADRON) injection 4 mg; 0.4 mLs (4 mg total) by Other route once.  Chronic pain syndrome  Sciatica of left side  DDD (degenerative disc disease), lumbar  Chronic left-sided low back pain without sciatica -     triamcinolone acetonide (KENALOG-40) injection 40 mg; 1 mL (40 mg total) by Other route once.        ----------------------------------------------------------------------------------------------------------------------  Problem List Items Addressed This Visit    None    Visit Diagnoses    Facet arthritis of lumbar region [facility])    -  Primary   Relevant Medications   triamcinolone acetonide (KENALOG-40) injection 40 mg (Completed)   sodium chloride flush (NS) 0.9 % injection 10 mL   ropivacaine (PF) 2 mg/mL (0.2%) (NAROPIN) injection 10 mL (Completed)   midazolam (VERSED) 5 MG/5ML injection 5 mg (Completed)   lidocaine (PF) (XYLOCAINE) 1 % injection 5 mL (Completed)   lactated ringers infusion 1,000 mL   dexamethasone (DECADRON) injection 4 mg (Completed)   Chronic pain syndrome       Sciatica of left side       Relevant Medications   midazolam (VERSED) 5 MG/5ML injection 5 mg (Completed)   DDD (degenerative disc disease), lumbar       Relevant Medications   triamcinolone acetonide (KENALOG-40) injection 40 mg (Completed)   dexamethasone (DECADRON) injection 4 mg (Completed)   Chronic left-sided low  back pain without sciatica       Relevant Medications   triamcinolone acetonide (KENALOG-40) injection 40 mg (Completed)   dexamethasone (DECADRON) injection 4 mg (Completed)        ----------------------------------------------------------------------------------------------------------------------  1. Facet arthritis of lumbar region [facility]) I reviewed some possible options with Cassidy Hernandez today. Unfortunately she continues to have persistent left side lower back pain. Fortunately the left lower leg pain seems to be better and the pain on her right side seems to have improved. In regards to the persistent left-sided lower back pain she is decided pursue a repeat left side facet block to see if this can further improve her pain relief. In the meantime on her to continue with her stretching exercises and massage for the left lower back pain. We'll have her  return to clinic in 1 month for reevaluation. - sodium chloride flush (NS) 0.9 % injection 10 mL; 10 mLs by Other route once. - ropivacaine (PF) 2 mg/mL (0.2%) (NAROPIN) injection 10 mL; 10 mLs by Epidural route once. - midazolam (VERSED) 5 MG/5ML injection 5 mg; Inject 5 mLs (5 mg total) into the vein once. - lidocaine (PF) (XYLOCAINE) 1 % injection 5 mL; Inject 5 mLs into the skin once. - lactated ringers infusion 1,000 mL; Inject 1,000 mLs into the vein continuous. - dexamethasone (DECADRON) injection 4 mg; 0.4 mLs (4 mg total) by Other route once.  2. Chronic pain syndrome As above  3. Sciatica of left side Return to clinic in 1 month for possible lumbar epidural steroid depending on how she responds to today's therapy.  4. DDD (degenerative disc disease), lumbar As above  5. Chronic left-sided low back pain without sciatica We'll also perform a trigger point injection to the after mentioned trigger point in the left gluteal region today as some benefits of all these procedures been reviewed with her in detail. - triamcinolone  acetonide (KENALOG-40) injection 40 mg; 1 mL (40 mg total) by Other route once.    ----------------------------------------------------------------------------------------------------------------------  I am having Cassidy Hernandez maintain her amLODipine, aspirin EC, atenolol, chlorthalidone, losartan, multivitamin, nitroGLYCERIN, omeprazole, rosuvastatin, zolpidem, and losartan. We administered triamcinolone acetonide, ropivacaine (PF) 2 mg/mL (0.2%), midazolam, lidocaine (PF), lactated ringers, and dexamethasone.   Meds ordered this encounter  Medications  . triamcinolone acetonide (KENALOG-40) injection 40 mg  . sodium chloride flush (NS) 0.9 % injection 10 mL  . ropivacaine (PF) 2 mg/mL (0.2%) (NAROPIN) injection 10 mL  . midazolam (VERSED) 5 MG/5ML injection 5 mg  . lidocaine (PF) (XYLOCAINE) 1 % injection 5 mL  . lactated ringers infusion 1,000 mL  . dexamethasone (DECADRON) injection 4 mg   Patient's Medications  New Prescriptions   No medications on file  Previous Medications   AMLODIPINE (NORVASC) 5 MG TABLET    Take by mouth daily.    ASPIRIN EC 81 MG TABLET    Take by mouth daily.    ATENOLOL (TENORMIN) 50 MG TABLET    Take by mouth 2 (two) times daily.    CHLORTHALIDONE (HYGROTON) 25 MG TABLET    TAKE 1/2 TABLET EVERY DAY   LOSARTAN (COZAAR) 100 MG TABLET    Take 100 mg by mouth daily.    LOSARTAN (COZAAR) 100 MG TABLET    Take by mouth.   MULTIPLE VITAMIN (MULTIVITAMIN) CAPSULE    Take by mouth daily.    NITROGLYCERIN (NITROSTAT) 0.4 MG SL TABLET    Place under the tongue.   OMEPRAZOLE (PRILOSEC) 20 MG CAPSULE    Take by mouth daily.    ROSUVASTATIN (CRESTOR) 20 MG TABLET    Take by mouth daily.    ZOLPIDEM (AMBIEN) 10 MG TABLET    Take by mouth at bedtime as needed.   Modified Medications   No medications on file  Discontinued Medications   No medications on file    ---------------------------------------------------------------------------------------------------------------------- Procedure: Left side medial branch block to the facets under fluoroscopic guidance at L2-3 L3-4 L4-5 and L5-S1   Patient was taken to the fluoroscopy suite and placed in prone position. A total dose of 2 mg of Versed with 0 cc of fentanyl were titrated for moderate sedation. Vital signs were stable throughout the procedure. The  overlying area of skin on the  left side was prepped with Betadine 3 and strict aseptic technique was utilized  throughout the procedure. Flouroscopy was used to I identified the areas overlying the aforementioned facets at L2-3 L3-4 L4-5 and L5-S1.   1% lidocaine 1 cc was infiltrated subcutaneously and into the fascia with a 25-gauge needle at each of these sites. I then advanced a 22-gauge 3-1/2 inch Quinckie needle with the needle tip to lie at the "[facility]" portion of the [employer] dog". There was negative aspiration for heme or CSF and  no paresthesia. I then injected 2 cc of ropivacaine 0.2% mixed with10 mg of triamcinolone at each of the aforementioned sites. These needles were withdrawn.   The patient was convalesced discharged home stable condition for follow-up as mentioned.  Trigger point injection to the left gluteal trigger point 1  Trigger point injection: The area overlying the aforementioned trigger points were prepped with alcohol. They were then injected with a 25-gauge needle with 6 cc of ropivacaine 0.2% and Decadron 4 mg at each site after negative aspiration for heme. This was performed after informed consent was obtained and risks and benefits reviewed. She tolerated this procedure without difficulty and was convalesced and discharged to home in stable condition for follow-up as mentioned.  @Shun Pletz, MD@ Follow-up: Return for evaluation, procedure.    Molli Barrows, MD

## 2017-05-10 NOTE — Progress Notes (Signed)
Safety precautions to be maintained throughout the outpatient stay will include: orient to surroundings, keep bed in low position, maintain call bell within reach at all times, provide assistance with transfer out of bed and ambulation.  

## 2017-05-10 NOTE — Patient Instructions (Signed)
Pain Management Discharge Instructions  General Discharge Instructions :  If you need to reach your doctor call: Monday-Friday 8:00 am - 4:00 pm at [phone] or toll free [phone].  After clinic hours [phone] to have operator reach doctor.  Bring all of your medication bottles to all your appointments in the pain clinic.  To cancel or reschedule your appointment with Pain Management please remember to call 24 hours in advance to avoid a fee.  Refer to the educational materials which you have been given on: General Risks, I had my Procedure. Discharge Instructions, Post Sedation.  Post Procedure Instructions:  The drugs you were given will stay in your system until tomorrow, so for the next 24 hours you should not drive, make any legal decisions or drink any alcoholic beverages.  You may eat anything you prefer, but it is better to start with liquids then soups and crackers, and gradually work up to solid foods.  Please notify your doctor immediately if you have any unusual bleeding, trouble breathing or pain that is not related to your normal pain.  Depending on the type of procedure that was done, some parts of your body may feel week and/or numb.  This usually clears up by tonight or the next day.  Walk with the use of an assistive device or accompanied by an adult for the 24 hours.  You may use ice on the affected area for the first 24 hours.  Put ice in a Ziploc bag and cover with a towel and place against area 15 minutes on 15 minutes off.  You may switch to heat after 24 hours.GENERAL RISKS AND COMPLICATIONS  What are the risk, side effects and possible complications? Generally speaking, most procedures are safe.  However, with any procedure there are risks, side effects, and the possibility of complications.  The risks and complications are dependent upon the sites that are lesioned, or the type of nerve block to be performed.  The closer the procedure is to the spine,  the more serious the risks are.  Great care is taken when placing the radio frequency needles, block needles or lesioning probes, but sometimes complications can occur. 1. Infection: Any time there is an injection through the skin, there is a risk of infection.  This is why sterile conditions are used for these blocks.  There are four possible types of infection. 1. Localized skin infection. 2. Central Nervous System Infection-This can be in the form of Meningitis, which can be deadly. 3. Epidural Infections-This can be in the form of an epidural abscess, which can cause pressure inside of the spine, causing compression of the spinal cord with subsequent paralysis. This would require an emergency surgery to decompress, and there are no guarantees that the patient would recover from the paralysis. 4. Discitis-This is an infection of the intervertebral discs.  It occurs in about 1% of discography procedures.  It is difficult to treat and it may lead to surgery.        2. Pain: the needles have to go through skin and soft tissues, will cause soreness.       3. Damage to internal structures:  The nerves to be lesioned may be near blood vessels or    other nerves which can be potentially damaged.       4. Bleeding: Bleeding is more common if the patient is taking blood thinners such as  aspirin, Coumadin, Ticiid, Plavix, etc., or if he/she have some genetic predisposition  such as   hemophilia. Bleeding into the spinal canal can cause compression of the spinal  cord with subsequent paralysis.  This would require an emergency surgery to  decompress and there are no guarantees that the patient would recover from the  paralysis.       5. Pneumothorax:  Puncturing of a lung is a possibility, every time a needle is introduced in  the area of the chest or upper back.  Pneumothorax refers to free air around the  collapsed lung(s), inside of the thoracic cavity (chest cavity).  Another two possible  complications  related to a similar event would include: Hemothorax and Chylothorax.   These are variations of the Pneumothorax, where instead of air around the collapsed  lung(s), you may have blood or chyle, respectively.       6. Spinal headaches: They may occur with any procedures in the area of the spine.       7. Persistent CSF (Cerebro-Spinal Fluid) leakage: This is a rare problem, but may occur  with prolonged intrathecal or epidural catheters either due to the formation of a fistulous  track or a dural tear.       8. Nerve damage: By working so close to the spinal cord, there is always a possibility of  nerve damage, which could be as serious as a permanent spinal cord injury with  paralysis.       9. Death:  Although rare, severe deadly allergic reactions known as "Anaphylactic  reaction" can occur to any of the medications used.      10. Worsening of the symptoms:  We can always make thing worse.  What are the chances of something like this happening? Chances of any of this occuring are extremely low.  By statistics, you have more of a chance of getting killed in a motor vehicle accident: while driving to the hospital than any of the above occurring .  Nevertheless, you should be aware that they are possibilities.  In general, it is similar to taking a shower.  Everybody knows that you can slip, hit your head and get killed.  Does that mean that you should not shower again?  Nevertheless always keep in mind that statistics do not mean anything if you happen to be on the wrong side of them.  Even if a procedure has a 1 (one) in a 1,000,000 (million) chance of going wrong, it you happen to be that one..Also, keep in mind that by statistics, you have more of a chance of having something go wrong when taking medications.  Who should not have this procedure? If you are on a blood thinning medication (e.g. Coumadin, Plavix, see list of "Blood Thinners"), or if you have an active infection going on, you should not  have the procedure.  If you are taking any blood thinners, please inform your physician.  How should I prepare for this procedure?  Do not eat or drink anything at least six hours prior to the procedure.  Bring a driver with you .  It cannot be a taxi.  Come accompanied by an adult that can drive you back, and that is strong enough to help you if your legs get weak or numb from the local anesthetic.  Take all of your medicines the morning of the procedure with just enough water to swallow them.  If you have diabetes, make sure that you are scheduled to have your procedure done first thing in the morning, whenever possible.  If you have diabetes,   take only half of your insulin dose and notify our nurse that you have done so as soon as you arrive at the clinic.  If you are diabetic, but only take blood sugar pills (oral hypoglycemic), then do not take them on the morning of your procedure.  You may take them after you have had the procedure.  Do not take aspirin or any aspirin-containing medications, at least eleven (11) days prior to the procedure.  They may prolong bleeding.  Wear loose fitting clothing that may be easy to take off and that you would not mind if it got stained with Betadine or blood.  Do not wear any jewelry or perfume  Remove any nail coloring.  It will interfere with some of our monitoring equipment.  NOTE: Remember that this is not meant to be interpreted as a complete list of all possible complications.  Unforeseen problems may occur.  BLOOD THINNERS The following drugs contain aspirin or other products, which can cause increased bleeding during surgery and should not be taken for 2 weeks prior to and 1 week after surgery.  If you should need take something for relief of minor pain, you may take acetaminophen which is found in Tylenol,m Datril, Anacin-3 and Panadol. It is not blood thinner. The products listed below are.  Do not take any of the products listed below  in addition to any listed on your instruction sheet.  A.P.C or A.P.C with Codeine Codeine Phosphate Capsules #3 Ibuprofen Ridaura  ABC compound Congesprin Imuran rimadil  Advil Cope Indocin Robaxisal  Alka-Seltzer Effervescent Pain Reliever and Antacid Coricidin or Coricidin-D  Indomethacin Rufen  Alka-Seltzer plus Cold Medicine Cosprin Ketoprofen S-A-C Tablets  Anacin Analgesic Tablets or Capsules Coumadin Korlgesic Salflex  Anacin Extra Strength Analgesic tablets or capsules CP-2 Tablets Lanoril Salicylate  Anaprox Cuprimine Capsules Levenox Salocol  Anexsia-D Dalteparin Magan Salsalate  Anodynos Darvon compound Magnesium Salicylate Sine-off  Ansaid Dasin Capsules Magsal Sodium Salicylate  Anturane Depen Capsules Marnal Soma  APF Arthritis pain formula Dewitt's Pills Measurin Stanback  Argesic Dia-Gesic Meclofenamic Sulfinpyrazone  Arthritis Bayer Timed Release Aspirin Diclofenac Meclomen Sulindac  Arthritis pain formula Anacin Dicumarol Medipren Supac  Analgesic (Safety coated) Arthralgen Diffunasal Mefanamic Suprofen  Arthritis Strength Bufferin Dihydrocodeine Mepro Compound Suprol  Arthropan liquid Dopirydamole Methcarbomol with Aspirin Synalgos  ASA tablets/Enseals Disalcid Micrainin Tagament  Ascriptin Doan's Midol Talwin  Ascriptin A/D Dolene Mobidin Tanderil  Ascriptin Extra Strength Dolobid Moblgesic Ticlid  Ascriptin with Codeine Doloprin or Doloprin with Codeine Momentum Tolectin  Asperbuf Duoprin Mono-gesic Trendar  Aspergum Duradyne Motrin or Motrin IB Triminicin  Aspirin plain, buffered or enteric coated Durasal Myochrisine Trigesic  Aspirin Suppositories Easprin Nalfon Trillsate  Aspirin with Codeine Ecotrin Regular or Extra Strength Naprosyn Uracel  Atromid-S Efficin Naproxen Ursinus  Auranofin Capsules Elmiron Neocylate Vanquish  Axotal Emagrin Norgesic Verin  Azathioprine Empirin or Empirin with Codeine Normiflo Vitamin E  Azolid Emprazil Nuprin Voltaren  Bayer  Aspirin plain, buffered or children's or timed BC Tablets or powders Encaprin Orgaran Warfarin Sodium  Buff-a-Comp Enoxaparin Orudis Zorpin  Buff-a-Comp with Codeine Equegesic Os-Cal-Gesic   Buffaprin Excedrin plain, buffered or Extra Strength Oxalid   Bufferin Arthritis Strength Feldene Oxphenbutazone   Bufferin plain or Extra Strength Feldene Capsules Oxycodone with Aspirin   Bufferin with Codeine Fenoprofen Fenoprofen Pabalate or Pabalate-SF   Buffets II Flogesic Panagesic   Buffinol plain or Extra Strength Florinal or Florinal with Codeine Panwarfarin   Buf-Tabs Flurbiprofen Penicillamine   Butalbital Compound Four-way cold tablets   Penicillin   Butazolidin Fragmin Pepto-Bismol   Carbenicillin Geminisyn Percodan   Carna Arthritis Reliever Geopen Persantine   Carprofen Gold's salt Persistin   Chloramphenicol Goody's Phenylbutazone   Chloromycetin Haltrain Piroxlcam   Clmetidine heparin Plaquenil   Cllnoril Hyco-pap Ponstel   Clofibrate Hydroxy chloroquine Propoxyphen         Before stopping any of these medications, be sure to consult the physician who ordered them.  Some, such as Coumadin (Warfarin) are ordered to prevent or treat serious conditions such as "deep thrombosis", "pumonary embolisms", and other heart problems.  The amount of time that you may need off of the medication may also vary with the medication and the reason for which you were taking it.  If you are taking any of these medications, please make sure you notify your pain physician before you undergo any procedures.         Epidural Steroid Injection An epidural steroid injection is a shot of steroid medicine and numbing medicine that is given into the space between the spinal cord and the bones in your back (epidural space). The shot helps relieve pain caused by an irritated or swollen nerve root. The amount of pain relief you get from the injection depends on what is causing the nerve to be swollen and  irritated, and how long your pain lasts. You are more likely to benefit from this injection if your pain is strong and comes on suddenly rather than if you have had pain for a long time. Tell a health care provider about:  Any allergies you have.  All medicines you are taking, including vitamins, herbs, eye drops, creams, and over-the-counter medicines.  Any problems you or family members have had with anesthetic medicines.  Any blood disorders you have.  Any surgeries you have had.  Any medical conditions you have.  Whether you are pregnant or may be pregnant. What are the risks? Generally, this is a safe procedure. However, problems may occur, including:  Headache.  Bleeding.  Infection.  Allergic reaction to medicines.  Damage to your nerves.  What happens before the procedure? Staying hydrated Follow instructions from your health care provider about hydration, which may include:  Up to 2 hours before the procedure - you may continue to drink clear liquids, such as water, clear fruit juice, black coffee, and plain tea.  Eating and drinking restrictions Follow instructions from your health care provider about eating and drinking, which may include:  8 hours before the procedure - stop eating heavy meals or foods such as meat, fried foods, or fatty foods.  6 hours before the procedure - stop eating light meals or foods, such as toast or cereal.  6 hours before the procedure - stop drinking milk or drinks that contain milk.  2 hours before the procedure - stop drinking clear liquids.  Medicine  You may be given medicines to lower anxiety.  Ask your health care provider about: ? Changing or stopping your regular medicines. This is especially important if you are taking diabetes medicines or blood thinners. ? Taking medicines such as aspirin and ibuprofen. These medicines can thin your blood. Do not take these medicines before your procedure if your health care  provider instructs you not to. General instructions  Plan to have someone take you home from the hospital or clinic. What happens during the procedure?  You may receive a medicine to help you relax (sedative).  You will be asked to lie on your abdomen.  The  injection site will be cleaned.  A numbing medicine (local anesthetic) will be used to numb the injection site.  A needle will be inserted through your skin into the epidural space. You may feel some discomfort when this happens. An X-ray machine will be used to make sure the needle is put as close as possible to the affected nerve.  A steroid medicine and a local anesthetic will be injected into the epidural space.  The needle will be removed.  A bandage (dressing) will be put over the injection site. What happens after the procedure?  Your blood pressure, heart rate, breathing rate, and blood oxygen level will be monitored until the medicines you were given have worn off.  Your arm or leg may feel weak or numb for a few hours.  The injection site may feel sore.  Do not drive for 24 hours if you received a sedative. This information is not intended to replace advice given to you by your health care provider. Make sure you discuss any questions you have with your health care provider. Document Released: 11/01/2007 Document Revised: 01/06/2016 Document Reviewed: 11/10/2015 Elsevier Interactive Patient Education  2017 [employer].

## 2017-05-11 ENCOUNTER — Telehealth: Payer: Self-pay

## 2017-05-11 NOTE — Telephone Encounter (Signed)
Post procedure phone call.  States she is doing OK.   

## 2017-06-19 ENCOUNTER — Other Ambulatory Visit: Payer: Self-pay | Admitting: Anesthesiology

## 2017-06-19 ENCOUNTER — Other Ambulatory Visit: Payer: Self-pay

## 2017-06-19 ENCOUNTER — Ambulatory Visit (HOSPITAL_BASED_OUTPATIENT_CLINIC_OR_DEPARTMENT_OTHER): Payer: Medicare Other | Admitting: Anesthesiology

## 2017-06-19 ENCOUNTER — Encounter: Payer: Self-pay | Admitting: Anesthesiology

## 2017-06-19 ENCOUNTER — Ambulatory Visit
Admission: RE | Admit: 2017-06-19 | Discharge: 2017-06-19 | Disposition: A | Discharge status: Home / Self Care | Payer: Medicare Other | Source: Ambulatory Visit | Attending: Anesthesiology | Admitting: Anesthesiology

## 2017-06-19 VITALS — BP 172/104 | HR 77 | Temp 96.9°F | Resp 24 | Ht 63.0 in | Wt 180.0 lb

## 2017-06-19 DIAGNOSIS — M545 Low back pain, unspecified: Secondary | ICD-10-CM

## 2017-06-19 DIAGNOSIS — M5432 Sciatica, left side: Secondary | ICD-10-CM | POA: Diagnosis not present

## 2017-06-19 DIAGNOSIS — G894 Chronic pain syndrome: Secondary | ICD-10-CM | POA: Diagnosis not present

## 2017-06-19 DIAGNOSIS — R52 Pain, unspecified: Secondary | ICD-10-CM

## 2017-06-19 DIAGNOSIS — G8929 Other chronic pain: Secondary | ICD-10-CM

## 2017-06-19 DIAGNOSIS — M4696 Unspecified inflammatory spondylopathy, lumbar region: Secondary | ICD-10-CM

## 2017-06-19 DIAGNOSIS — M5136 Other intervertebral disc degeneration, lumbar region: Secondary | ICD-10-CM | POA: Diagnosis not present

## 2017-06-19 DIAGNOSIS — M47816 Spondylosis without myelopathy or radiculopathy, lumbar region: Secondary | ICD-10-CM

## 2017-06-19 MED ORDER — DEXAMETHASONE SODIUM PHOSPHATE 10 MG/ML IJ SOLN
10.0000 mg | Freq: Once | INTRAMUSCULAR | Status: AC
Start: 2017-06-19 — End: 2017-06-19
  Administered 2017-06-19: 10 mg

## 2017-06-19 MED ORDER — LIDOCAINE HCL (PF) 1 % IJ SOLN
5.0000 mL | Freq: Once | INTRAMUSCULAR | Status: AC
  Administered 2017-06-19: 5 mL via SUBCUTANEOUS

## 2017-06-19 MED ORDER — IOPAMIDOL (ISOVUE-M 200) INJECTION 41%
20.0000 mL | Freq: Once | INTRAMUSCULAR | Status: DC | PRN
  Administered 2017-06-19: 10 mL
  Filled 2017-06-19: qty 20

## 2017-06-19 MED ORDER — DEXAMETHASONE SODIUM PHOSPHATE 10 MG/ML IJ SOLN
INTRAMUSCULAR | Status: AC
  Filled 2017-06-19: qty 1

## 2017-06-19 MED ORDER — LIDOCAINE HCL (PF) 1 % IJ SOLN
INTRAMUSCULAR | Status: AC
  Filled 2017-06-19: qty 5

## 2017-06-19 MED ORDER — IOPAMIDOL (ISOVUE-M 200) INJECTION 41%
INTRAMUSCULAR | Status: AC
  Filled 2017-06-19: qty 10

## 2017-06-19 MED ORDER — TRIAMCINOLONE ACETONIDE 40 MG/ML IJ SUSP
40.0000 mg | Freq: Once | INTRAMUSCULAR | Status: AC
  Administered 2017-06-19: 40 mg

## 2017-06-19 MED ORDER — ROPIVACAINE HCL 2 MG/ML IJ SOLN
INTRAMUSCULAR | Status: AC
  Filled 2017-06-19: qty 20

## 2017-06-19 MED ORDER — TRIAMCINOLONE ACETONIDE 40 MG/ML IJ SUSP
INTRAMUSCULAR | Status: AC
  Filled 2017-06-19: qty 1

## 2017-06-19 MED ORDER — ROPIVACAINE HCL 2 MG/ML IJ SOLN
10.0000 mL | Freq: Once | INTRAMUSCULAR | Status: AC
  Administered 2017-06-19: 10 mL via EPIDURAL

## 2017-06-19 MED ORDER — SODIUM CHLORIDE 0.9% FLUSH
10.0000 mL | Freq: Once | INTRAVENOUS | Status: AC
  Administered 2017-06-19: 10 mL

## 2017-06-19 MED ORDER — SODIUM CHLORIDE 0.9 % IJ SOLN
INTRAMUSCULAR | Status: AC
  Filled 2017-06-19: qty 10

## 2017-06-19 NOTE — Progress Notes (Signed)
Safety precautions to be maintained throughout the outpatient stay will include: orient to surroundings, keep bed in low position, maintain call bell within reach at all times, provide assistance with transfer out of bed and ambulation.  

## 2017-06-19 NOTE — Patient Instructions (Signed)

## 2017-06-19 NOTE — Progress Notes (Signed)
Subjective:  Patient ID: Cassidy Hernandez, female    DOB: [birth year]  Age: 76 y.o. MRN: [id number]  CC: Back Pain (lower) and Hip Pain (left)   Procedure: L4-5 epidural steroid under fluoroscopic guidance out sedation Left gluteal trigger point injection x1  HPI [name] presents for reevaluation last seen in October.  At that point she had a left side facet block and feels that she has made approximately 50% overall improvement.  Her primary problem is a persistent left gluteal and left hip with less posterior lateral leg pain.  Her low back pain is approximate 50% improved overall.  No change in bowel or bladder function or lower extremity strength or function are noted at this time.  She presents today requesting a repeat epidural steroid injection as previously reviewed at her last visit.  Outpatient Medications Prior to Visit  Medication Sig Dispense Refill  . amLODipine (NORVASC) 5 MG tablet Take by mouth daily.     [name] aspirin EC 81 MG tablet Take by mouth daily.     [name] atenolol (TENORMIN) 50 MG tablet Take by mouth 2 (two) times daily.     . chlorthalidone (HYGROTON) 25 MG tablet TAKE 1/2 TABLET EVERY DAY    . losartan (COZAAR) 100 MG tablet Take 100 mg by mouth daily.     . Multiple Vitamin (MULTIVITAMIN) capsule Take by mouth daily.     . nitroGLYCERIN (NITROSTAT) 0.4 MG SL tablet Place under the tongue.    [name] omeprazole (PRILOSEC) 20 MG capsule Take by mouth daily.     . rosuvastatin (CRESTOR) 20 MG tablet Take by mouth daily.     [name] zolpidem (AMBIEN) 10 MG tablet Take by mouth at bedtime as needed.     [name] losartan (COZAAR) 100 MG tablet Take by mouth.     No facility-administered medications prior to visit.     Review of Systems CNS: No confusion or sedation Cardiac: No angina or palpitations GI: No constipation or abdominal pain Musculoskeletal: No lower extremity numbness or tingling  Objective:  BP (!) 172/104   Pulse 77   Temp (!) 96.9 F (36.1 C)   Resp (!) 24    Ht 5\' 3"  (1.6 m)   Wt 180 lb (81.6 kg)   SpO2 92%   BMI 31.89 kg/m    BP Readings from Last 3 Encounters:  06/19/17 (!) 172/104  05/10/17 116/64  04/04/17 134/63     Wt Readings from Last 3 Encounters:  06/19/17 180 lb (81.6 kg)  05/10/17 180 lb (81.6 kg)  04/04/17 180 lb (81.6 kg)     Physical Exam Pt is alert and oriented PERRL EOMI HEART IS RRR no murmur or rub LCTA no wheezing or rhales MUSCULOSKELETAL reveals a gluteal trigger point overlying the left SI joint.  Her muscle tone and bulk in the lower extremities is at baseline.  She continues to have a positive straight leg raise on the left side.  Negative on the right with no evidence of any trigger points  in the midline paraspinous muscles.  Labs  No results found for: HGBA1C Lab Results  Component Value Date   CREATININE 1.09 04/19/2014    -------------------------------------------------------------------------------------------------------------------- Lab Results  Component Value Date   WBC 13.2 (H) 04/19/2014   HGB 15.3 04/19/2014   HCT 46.4 04/19/2014   PLT 289 04/19/2014   GLUCOSE 131 (H) 04/19/2014   ALT 71 (H) 04/19/2014   AST 26 04/19/2014   NA 136 04/19/2014  K 3.5 04/19/2014   CL 102 04/19/2014   CREATININE 1.09 04/19/2014   BUN 21 (H) 04/19/2014   CO2 26 04/19/2014    --------------------------------------------------------------------------------------------------------------------- Dg C-arm 1-60 Min-no Report  Result Date: 06/19/2017 Fluoroscopy was utilized by the requesting physician.  No radiographic interpretation.     Assessment & Plan:   [name] was seen today for back pain and hip pain.  Diagnoses and all orders for this visit:  Facet arthritis of lumbar region [facility])  Chronic pain syndrome  Sciatica of left side -     triamcinolone acetonide (KENALOG-40) injection 40 mg -     sodium chloride flush (NS) 0.9 % injection 10 mL -     ropivacaine (PF) 2 mg/mL (0.2%)  (NAROPIN) injection 10 mL -     lidocaine (PF) (XYLOCAINE) 1 % injection 5 mL -     iopamidol (ISOVUE-M) 41 % intrathecal injection 20 mL  DDD (degenerative disc disease), lumbar -     dexamethasone (DECADRON) injection 10 mg  Chronic left-sided low back pain without sciatica        ----------------------------------------------------------------------------------------------------------------------  Problem List Items Addressed This Visit    None    Visit Diagnoses    Facet arthritis of lumbar region [facility])    -  Primary   Relevant Medications   triamcinolone acetonide (KENALOG-40) injection 40 mg (Completed)   dexamethasone (DECADRON) injection 10 mg (Completed)   Chronic pain syndrome       Sciatica of left side       Relevant Medications   triamcinolone acetonide (KENALOG-40) injection 40 mg (Completed)   sodium chloride flush (NS) 0.9 % injection 10 mL (Completed)   ropivacaine (PF) 2 mg/mL (0.2%) (NAROPIN) injection 10 mL (Completed)   lidocaine (PF) (XYLOCAINE) 1 % injection 5 mL (Completed)   iopamidol (ISOVUE-M) 41 % intrathecal injection 20 mL   DDD (degenerative disc disease), lumbar       Relevant Medications   triamcinolone acetonide (KENALOG-40) injection 40 mg (Completed)   dexamethasone (DECADRON) injection 10 mg (Completed)   Chronic left-sided low back pain without sciatica       Relevant Medications   triamcinolone acetonide (KENALOG-40) injection 40 mg (Completed)   dexamethasone (DECADRON) injection 10 mg (Completed)        ----------------------------------------------------------------------------------------------------------------------  1. Facet arthritis of lumbar region [facility]) Continue with core strengthening with return to clinic in 2 months for reevaluation.  2. Chronic pain syndrome Continue with conservative medication management and as above  3. Sciatica of left side Proceed with a L4-5 epidural steroid injection today to see if we  can help with some of the hip and posterior leg radicular symptoms.  We gone over the risk benefits of the procedure in full detail and all questions answered.  We will have her back in the clinic in 2 months for reevaluation. - triamcinolone acetonide (KENALOG-40) injection 40 mg - sodium chloride flush (NS) 0.9 % injection 10 mL - ropivacaine (PF) 2 mg/mL (0.2%) (NAROPIN) injection 10 mL - lidocaine (PF) (XYLOCAINE) 1 % injection 5 mL - iopamidol (ISOVUE-M) 41 % intrathecal injection 20 mL  4. DDD (degenerative disc disease), lumbar As above - dexamethasone (DECADRON) injection 10 mg  5. Chronic left-sided low back pain without sciatica As above    ----------------------------------------------------------------------------------------------------------------------  I am having Elfredia Nevins maintain her amLODipine, aspirin EC, atenolol, chlorthalidone, losartan, multivitamin, nitroGLYCERIN, omeprazole, rosuvastatin, zolpidem, and losartan. We administered triamcinolone acetonide, sodium chloride flush, ropivacaine (PF) 2 mg/mL (0.2%), lidocaine (PF), iopamidol,  and dexamethasone.   Meds ordered this encounter  Medications  . triamcinolone acetonide (KENALOG-40) injection 40 mg  . sodium chloride flush (NS) 0.9 % injection 10 mL  . ropivacaine (PF) 2 mg/mL (0.2%) (NAROPIN) injection 10 mL  . lidocaine (PF) (XYLOCAINE) 1 % injection 5 mL  . iopamidol (ISOVUE-M) 41 % intrathecal injection 20 mL  . dexamethasone (DECADRON) injection 10 mg     Medication List        Accurate as of 06/19/17  3:45 PM. Always use your most recent med list.          amLODipine 5 MG tablet Commonly known as:  NORVASC   aspirin EC 81 MG tablet   atenolol 50 MG tablet Commonly known as:  TENORMIN   chlorthalidone 25 MG tablet Commonly known as:  HYGROTON   * losartan 100 MG tablet Commonly known as:  COZAAR   * losartan 100 MG tablet Commonly known as:  COZAAR   multivitamin capsule     nitroGLYCERIN 0.4 MG SL tablet Commonly known as:  NITROSTAT   omeprazole 20 MG capsule Commonly known as:  PRILOSEC   rosuvastatin 20 MG tablet Commonly known as:  CRESTOR   zolpidem 10 MG tablet Commonly known as:  AMBIEN      * This list has 2 medication(s) that are the same as other medications prescribed for you. Read the directions carefully, and ask your doctor or other care provider to review them with you.         ---------------------------------------------------------------------------------------------------------------------- Procedure: L4-5 epidural steroid under fluoroscopic guidance without sedation and left gluteal SI trigger point injection   Procedure: L4-5 LESI with fluoroscopic guidance and moderate sedation  NOTE: The risks, benefits, and expectations of the procedure have been discussed and explained to the patient who was understanding and in agreement with suggested treatment plan. No guarantees were made.  DESCRIPTION OF PROCEDURE: Lumbar epidural steroid injection with IV Versed, EKG, blood pressure, pulse, and pulse oximetry monitoring. The procedure was performed with the patient in the prone position under fluoroscopic guidance. I injected subcutaneous lidocaine overlying the L4-5 site after its fluoroscopic identifictation.  Using strict aseptic technique, I then advanced an 18-gauge Tuohy epidural needle in the midline using interlaminar approach via loss-of-resistance to saline technique. There was negative aspiration for heme or  CSF.  I then confirmed position with both AP and Lateral fluoroscan.  2 cc of Isovue were injected and a  total of 5 mL of Preservative-Free normal saline mixed with 40 mg of Kenalog and 1cc Ropicaine 0.2 percent were injected incrementally via the  epidurally placed needle. The needle was removed. The patient tolerated the injection well and was convalesced and discharged to home in stable condition. Should the patient have any  post procedure difficulty they have been instructed on how to contact us for assistance.   Trigger point injection: The area overlying the aforementioned trigger points were prepped with alcohol. They were then injected with a 25-gauge needle with 7cc of ropivacaine 0.2% and Decadron 10 mg at  site after negative aspiration for heme. This was performed after informed consent was obtained and risks and benefits reviewed. She tolerated this procedure without difficulty and was convalesced and discharged to home in stable condition for follow-up as mentioned.  @James  Adams, MD@  Follow-up: Return in about 2 months (around 08/19/2017) for evaluation.    Molli Barrows, MD

## 2017-09-08 ENCOUNTER — Other Ambulatory Visit: Payer: Self-pay | Admitting: Internal Medicine

## 2017-09-08 DIAGNOSIS — R4781 Slurred speech: Secondary | ICD-10-CM

## 2017-09-13 ENCOUNTER — Ambulatory Visit: Payer: Medicare Other | Attending: Anesthesiology | Admitting: Anesthesiology

## 2017-09-13 ENCOUNTER — Other Ambulatory Visit: Payer: Self-pay

## 2017-09-13 ENCOUNTER — Encounter: Payer: Self-pay | Admitting: Anesthesiology

## 2017-09-13 VITALS — BP 150/85 | HR 72 | Temp 98.4°F | Resp 18 | Ht 63.0 in | Wt 180.0 lb

## 2017-09-13 DIAGNOSIS — Z7982 Long term (current) use of aspirin: Secondary | ICD-10-CM | POA: Diagnosis not present

## 2017-09-13 DIAGNOSIS — G8929 Other chronic pain: Secondary | ICD-10-CM | POA: Diagnosis not present

## 2017-09-13 DIAGNOSIS — Z79899 Other long term (current) drug therapy: Secondary | ICD-10-CM | POA: Insufficient documentation

## 2017-09-13 DIAGNOSIS — M545 Low back pain, unspecified: Secondary | ICD-10-CM

## 2017-09-13 DIAGNOSIS — M5432 Sciatica, left side: Secondary | ICD-10-CM | POA: Insufficient documentation

## 2017-09-13 DIAGNOSIS — G894 Chronic pain syndrome: Secondary | ICD-10-CM

## 2017-09-13 DIAGNOSIS — M533 Sacrococcygeal disorders, not elsewhere classified: Secondary | ICD-10-CM | POA: Diagnosis not present

## 2017-09-13 DIAGNOSIS — M47816 Spondylosis without myelopathy or radiculopathy, lumbar region: Secondary | ICD-10-CM

## 2017-09-13 DIAGNOSIS — M5136 Other intervertebral disc degeneration, lumbar region: Secondary | ICD-10-CM | POA: Insufficient documentation

## 2017-09-13 DIAGNOSIS — M4696 Unspecified inflammatory spondylopathy, lumbar region: Secondary | ICD-10-CM | POA: Diagnosis not present

## 2017-09-13 NOTE — Progress Notes (Signed)
Safety precautions to be maintained throughout the outpatient stay will include: orient to surroundings, keep bed in low position, maintain call bell within reach at all times, provide assistance with transfer out of bed and ambulation.  

## 2017-09-13 NOTE — Progress Notes (Signed)
Subjective:  Patient ID: Cassidy Hernandez, female    DOB: September 10, 1940  Age: 77 y.o. MRN: [id number]  CC: Back Pain (low)   Procedure: None  HPI Cassidy Hernandez presents for reevaluation.  She was last seen 2 months ago.  She has continued to have left hip low back pain of the same quality characteristic and distribution.  She has had this since 2002 and it previously responded well to injection therapy.  I reviewed her chart today and she reports that she had good success with an L3 selective nerve root block as reviewed with her and with her epidural steroids.  We have also reviewed her CT myelogram once again.  No lower extremity weakness or bowel bladder dysfunction is noted.  Is primarily just pain of the same quality characteristic and distribution affecting the left lower back radiating into the left hip when she is been standing for approximately 1 hour or more.  She can take Tylenol to keep the pain under decent control but generally breaks through and this limits her.  She sleeps well at night.  Otherwise she is in her usual state of health at this time.  She has had some reported speech dysfunction and is scheduled for a cerebral MRI to rule out any type of stroke pathology.  Outpatient Medications Prior to Visit  Medication Sig Dispense Refill  . amLODipine (NORVASC) 5 MG tablet Take by mouth daily.     [name] aspirin EC 81 MG tablet Take by mouth daily.     [name] atenolol (TENORMIN) 50 MG tablet Take by mouth 2 (two) times daily.     . chlorthalidone (HYGROTON) 25 MG tablet TAKE 1/2 TABLET EVERY DAY    . losartan (COZAAR) 100 MG tablet Take 100 mg by mouth daily.     [name] losartan (COZAAR) 100 MG tablet Take by mouth.    . Multiple Vitamin (MULTIVITAMIN) capsule Take by mouth daily.     . nitroGLYCERIN (NITROSTAT) 0.4 MG SL tablet Place under the tongue.    [name] omeprazole (PRILOSEC) 20 MG capsule Take by mouth daily.     . rosuvastatin (CRESTOR) 20 MG tablet Take by mouth daily.     [name] zolpidem  (AMBIEN) 10 MG tablet Take by mouth at bedtime as needed.      No facility-administered medications prior to visit.     Review of Systems CNS: No confusion or sedation she does report mildly slurred speech Cardiac: No angina or palpitations GI: No abdominal pain or constipation Constitutional: No nausea vomiting fever chills  Objective:  BP (!) 150/85   Pulse 72   Temp 98.4 F (36.9 C)   Resp 18   Ht 5\' 3"  (1.6 m)   Wt 180 lb (81.6 kg)   SpO2 92%   BMI 31.89 kg/m    BP Readings from Last 3 Encounters:  09/13/17 (!) 150/85  06/19/17 (!) 172/104  05/10/17 116/64     Wt Readings from Last 3 Encounters:  09/13/17 180 lb (81.6 kg)  06/19/17 180 lb (81.6 kg)  05/10/17 180 lb (81.6 kg)     Physical Exam Pt is alert and oriented PERRL EOMI HEART IS RRR no murmur or rub LCTA no wheezing or rales MUSCULOSKELETAL reveals no significant pain with the patient standing and with lumbar extension upon left or right lateral extension.  She has no significant paraspinous muscle tenderness.  With the patient supine she does not have any pain with compression over the anterior superior iliac  crest bilaterally.  She has good range of motion at the left hip with figure 4 motion and abduction abduction of the left hip.  This does not produce pain.  Her muscle tone and bulk and strength is good.  Labs  No results found for: HGBA1C Lab Results  Component Value Date   CREATININE 1.09 04/19/2014    -------------------------------------------------------------------------------------------------------------------- Lab Results  Component Value Date   WBC 13.2 (H) 04/19/2014   HGB 15.3 04/19/2014   HCT 46.4 04/19/2014   PLT 289 04/19/2014   GLUCOSE 131 (H) 04/19/2014   ALT 71 (H) 04/19/2014   AST 26 04/19/2014   NA 136 04/19/2014   K 3.5 04/19/2014   CL 102 04/19/2014   CREATININE 1.09 04/19/2014   BUN 21 (H) 04/19/2014   CO2 26 04/19/2014     --------------------------------------------------------------------------------------------------------------------- Dg C-arm 1-60 Min-no Report  Result Date: 06/19/2017 Fluoroscopy was utilized by the requesting physician.  No radiographic interpretation.     Assessment & Plan:   Cassidy Hernandez was seen today for back pain.  Diagnoses and all orders for this visit:  Facet arthritis of lumbar region [facility])  Chronic pain syndrome  Sciatica of left side  DDD (degenerative disc disease), lumbar  Chronic left-sided low back pain without sciatica  Sacro-iliac pain -     SACROILIAC JOINT INJECTION; Future        ----------------------------------------------------------------------------------------------------------------------  Problem List Items Addressed This Visit    None    Visit Diagnoses    Facet arthritis of lumbar region [facility])    -  Primary   Chronic pain syndrome       Sciatica of left side       DDD (degenerative disc disease), lumbar       Chronic left-sided low back pain without sciatica       Sacro-iliac pain       Relevant Orders   SACROILIAC JOINT INJECTION        ----------------------------------------------------------------------------------------------------------------------  1. Facet arthritis of lumbar region [facility]) Continue core stretching strengthening and efforts at weight loss.  2. Chronic pain syndrome Continue Tylenol as primary management for her pain  3. Sciatica of left side She ultimately may be a candidate for a left L3 selective nerve root block as described to her in detail today.  4. DDD (degenerative disc disease), lumbar   5. Chronic left-sided low back pain without sciatica   6. Sacro-iliac pain I am scheduling her for left side diagnostic SI joint injection at her next visit in approximately 2-3 weeks after she has had her cerebral MRI.  We have gone over the risks and benefits of the procedure with her in full detail  and all of her questions have been answered. - SACROILIAC JOINT INJECTION; Future    ----------------------------------------------------------------------------------------------------------------------  I am having Cassidy Hernandez maintain her amLODipine, aspirin EC, atenolol, chlorthalidone, losartan, multivitamin, nitroGLYCERIN, omeprazole, rosuvastatin, zolpidem, and losartan.   No orders of the defined types were placed in this encounter.  Patient's Medications  New Prescriptions   No medications on file  Previous Medications   AMLODIPINE (NORVASC) 5 MG TABLET    Take by mouth daily.    ASPIRIN EC 81 MG TABLET    Take by mouth daily.    ATENOLOL (TENORMIN) 50 MG TABLET    Take by mouth 2 (two) times daily.    CHLORTHALIDONE (HYGROTON) 25 MG TABLET    TAKE 1/2 TABLET EVERY DAY   LOSARTAN (COZAAR) 100 MG TABLET    Take  100 mg by mouth daily.    LOSARTAN (COZAAR) 100 MG TABLET    Take by mouth.   MULTIPLE VITAMIN (MULTIVITAMIN) CAPSULE    Take by mouth daily.    NITROGLYCERIN (NITROSTAT) 0.4 MG SL TABLET    Place under the tongue.   OMEPRAZOLE (PRILOSEC) 20 MG CAPSULE    Take by mouth daily.    ROSUVASTATIN (CRESTOR) 20 MG TABLET    Take by mouth daily.    ZOLPIDEM (AMBIEN) 10 MG TABLET    Take by mouth at bedtime as needed.   Modified Medications   No medications on file  Discontinued Medications   No medications on file   ----------------------------------------------------------------------------------------------------------------------  Follow-up: Return in about 3 weeks (around 10/04/2017) for evaluation, procedure.    Molli Barrows, MD

## 2017-09-18 ENCOUNTER — Ambulatory Visit
Admission: RE | Admit: 2017-09-18 | Discharge: 2017-09-18 | Disposition: A | Discharge status: Home / Self Care | Payer: Medicare Other | Source: Ambulatory Visit | Attending: Internal Medicine | Admitting: Internal Medicine

## 2017-09-18 DIAGNOSIS — R4781 Slurred speech: Secondary | ICD-10-CM | POA: Diagnosis present

## 2017-09-18 DIAGNOSIS — R9089 Other abnormal findings on diagnostic imaging of central nervous system: Secondary | ICD-10-CM | POA: Diagnosis not present

## 2017-10-09 ENCOUNTER — Ambulatory Visit
Admission: RE | Admit: 2017-10-09 | Discharge: 2017-10-09 | Disposition: A | Discharge status: Home / Self Care | Payer: Medicare Other | Source: Ambulatory Visit | Attending: Anesthesiology | Admitting: Anesthesiology

## 2017-10-09 ENCOUNTER — Other Ambulatory Visit: Payer: Self-pay

## 2017-10-09 ENCOUNTER — Encounter: Payer: Self-pay | Admitting: Anesthesiology

## 2017-10-09 ENCOUNTER — Other Ambulatory Visit: Payer: Self-pay | Admitting: Anesthesiology

## 2017-10-09 ENCOUNTER — Ambulatory Visit (HOSPITAL_BASED_OUTPATIENT_CLINIC_OR_DEPARTMENT_OTHER): Payer: Medicare Other | Admitting: Anesthesiology

## 2017-10-09 VITALS — BP 200/81 | HR 67 | Temp 98.0°F | Resp 18 | Ht 63.0 in | Wt 180.0 lb

## 2017-10-09 DIAGNOSIS — M5432 Sciatica, left side: Secondary | ICD-10-CM | POA: Diagnosis not present

## 2017-10-09 DIAGNOSIS — G894 Chronic pain syndrome: Secondary | ICD-10-CM

## 2017-10-09 DIAGNOSIS — M5136 Other intervertebral disc degeneration, lumbar region: Secondary | ICD-10-CM | POA: Insufficient documentation

## 2017-10-09 DIAGNOSIS — M47816 Spondylosis without myelopathy or radiculopathy, lumbar region: Secondary | ICD-10-CM

## 2017-10-09 DIAGNOSIS — M4696 Unspecified inflammatory spondylopathy, lumbar region: Secondary | ICD-10-CM

## 2017-10-09 DIAGNOSIS — Z79899 Other long term (current) drug therapy: Secondary | ICD-10-CM | POA: Insufficient documentation

## 2017-10-09 DIAGNOSIS — R52 Pain, unspecified: Secondary | ICD-10-CM

## 2017-10-09 DIAGNOSIS — M533 Sacrococcygeal disorders, not elsewhere classified: Secondary | ICD-10-CM

## 2017-10-09 DIAGNOSIS — M51369 Other intervertebral disc degeneration, lumbar region without mention of lumbar back pain or lower extremity pain: Secondary | ICD-10-CM

## 2017-10-09 MED ORDER — TRIAMCINOLONE ACETONIDE 40 MG/ML IJ SUSP: 40.0000 mg | Freq: Once | INTRAMUSCULAR | Status: DC

## 2017-10-09 MED ORDER — IOPAMIDOL (ISOVUE-M 200) INJECTION 41%
20.0000 mL | Freq: Once | INTRAMUSCULAR | Status: DC | PRN
  Administered 2017-10-09: 10 mL
  Filled 2017-10-09: qty 20

## 2017-10-09 MED ORDER — LIDOCAINE HCL (PF) 1 % IJ SOLN: 5.0000 mL | Freq: Once | INTRAMUSCULAR | Status: DC

## 2017-10-09 MED ORDER — TRIAMCINOLONE ACETONIDE 40 MG/ML IJ SUSP
INTRAMUSCULAR | Status: AC
  Filled 2017-10-09: qty 1

## 2017-10-09 MED ORDER — SODIUM CHLORIDE 0.9 % IJ SOLN
INTRAMUSCULAR | Status: AC
  Filled 2017-10-09: qty 10

## 2017-10-09 MED ORDER — ROPIVACAINE HCL 2 MG/ML IJ SOLN
INTRAMUSCULAR | Status: AC
  Filled 2017-10-09: qty 10

## 2017-10-09 MED ORDER — ROPIVACAINE HCL 2 MG/ML IJ SOLN
10.0000 mL | Freq: Once | INTRAMUSCULAR | Status: AC
  Administered 2017-10-09: 10 mL via EPIDURAL

## 2017-10-09 MED ORDER — IOPAMIDOL (ISOVUE-M 200) INJECTION 41%
INTRAMUSCULAR | Status: AC
  Filled 2017-10-09: qty 10

## 2017-10-09 MED ORDER — DEXAMETHASONE SODIUM PHOSPHATE 10 MG/ML IJ SOLN
10.0000 mg | Freq: Once | INTRAMUSCULAR | Status: AC
  Administered 2017-10-09: 10 mg
  Filled 2017-10-09: qty 1

## 2017-10-09 MED ORDER — SODIUM CHLORIDE 0.9% FLUSH: 10.0000 mL | Freq: Once | INTRAVENOUS | Status: DC

## 2017-10-09 NOTE — Patient Instructions (Signed)
Pain Management Discharge Instructions  General Discharge Instructions :  If you need to reach your doctor call: Monday-Friday 8:00 am - 4:00 pm at [phone] or toll free [phone].  After clinic hours [phone] to have operator reach doctor.  Bring all of your medication bottles to all your appointments in the pain clinic.  To cancel or reschedule your appointment with Pain Management please remember to call 24 hours in advance to avoid a fee.  Refer to the educational materials which you have been given on: General Risks, I had my Procedure. Discharge Instructions, Post Sedation.  Post Procedure Instructions:  Please notify your doctor immediately if you have any unusual bleeding, trouble breathing or pain that is not related to your normal pain.  Depending on the type of procedure that was done, some parts of your body may feel week and/or numb.  This usually clears up by tonight or the next day.  Walk with the use of an assistive device or accompanied by an adult for the 24 hours.  You may use ice on the affected area for the first 24 hours.  Put ice in a Ziploc bag and cover with a towel and place against area 15 minutes on 15 minutes off.  You may switch to heat after 24 hours. 

## 2017-10-09 NOTE — Progress Notes (Signed)
Subjective:  Patient ID: Cassidy Hernandez, female    DOB: 1941/03/30  Age: 77 y.o. MRN: [id number]  CC: Back Pain (lower)  Procedure: L4 left side selective nerve root block without sedation under fluoroscopic guidance  HPI Cassidy Hernandez presents for reevaluation.  Cassidy Hernandez continues to have severe pain that is unremitting in nature radiating from her low back into the left hip and down the posterior lateral aspect of the leg.  This is all left-sided with occasional cramping in her left calf and at the base of the left foot.  In the past at a previous pain clinic she had a selective nerve root block at L4 with significant improvement in her symptoms lasting almost a year.  She is requesting consideration for repeat L4 selective nerve root block today.  Otherwise no changes in lower extremity strength or function or bowel bladder function are noted at this time.  She had previous epidural steroid injections that have given her some temporary relief from a intralaminar approach but nothing sustained.    Outpatient Medications Prior to Visit  Medication Sig Dispense Refill  . amLODipine (NORVASC) 5 MG tablet Take by mouth daily.     [name] aspirin EC 81 MG tablet Take by mouth daily.     [name] atenolol (TENORMIN) 50 MG tablet Take by mouth 2 (two) times daily.     . chlorthalidone (HYGROTON) 25 MG tablet TAKE 1/2 TABLET EVERY DAY    . losartan (COZAAR) 100 MG tablet Take 100 mg by mouth daily.     [name] losartan (COZAAR) 100 MG tablet Take by mouth.    . Multiple Vitamin (MULTIVITAMIN) capsule Take by mouth daily.     [name] omeprazole (PRILOSEC) 20 MG capsule Take by mouth daily.     . rosuvastatin (CRESTOR) 20 MG tablet Take by mouth daily.     [name] zolpidem (AMBIEN) 10 MG tablet Take by mouth at bedtime as needed.     . nitroGLYCERIN (NITROSTAT) 0.4 MG SL tablet Place under the tongue.     No facility-administered medications prior to visit.     Review of Systems CNS: No confusion or sedation Cardiac: No  angina or palpitations GI: No abdominal pain or constipation   Objective:  BP (!) 200/81   Pulse 67   Temp 98 F (36.7 C)   Resp 18   Ht 5\' 3"  (1.6 m)   Wt 180 lb (81.6 kg)   SpO2 96%   BMI 31.89 kg/m    BP Readings from Last 3 Encounters:  10/09/17 (!) 200/81  09/13/17 (!) 150/85  06/19/17 (!) 172/104     Wt Readings from Last 3 Encounters:  10/09/17 180 lb (81.6 kg)  09/13/17 180 lb (81.6 kg)  06/19/17 180 lb (81.6 kg)     Physical Exam Pt is alert and oriented PERRL EOMI HEART IS RRR no murmur or rub LCTA no wheezing or rales MUSCULOSKELETAl Positive SLR Left and neg right.  Muscle tone and bulk is at baseline.   Labs  No results found for: HGBA1C Lab Results  Component Value Date   CREATININE 1.09 04/19/2014    -------------------------------------------------------------------------------------------------------------------- Lab Results  Component Value Date   WBC 13.2 (H) 04/19/2014   HGB 15.3 04/19/2014   HCT 46.4 04/19/2014   PLT 289 04/19/2014   GLUCOSE 131 (H) 04/19/2014   ALT 71 (H) 04/19/2014   AST 26 04/19/2014   NA 136 04/19/2014   K 3.5 04/19/2014   CL 102 04/19/2014  CREATININE 1.09 04/19/2014   BUN 21 (H) 04/19/2014   CO2 26 04/19/2014    --------------------------------------------------------------------------------------------------------------------- Dg C-arm 1-60 Min-no Report  Result Date: 10/09/2017 Fluoroscopy was utilized by the requesting physician.  No radiographic interpretation.     Assessment & Plan:   Cassidy Hernandez was seen today for back pain.  Diagnoses and all orders for this visit:  Sciatica of left side  Chronic pain syndrome  Facet arthritis of lumbar region ([place])  DDD (degenerative disc disease), lumbar  Sacro-iliac pain  Other orders -     triamcinolone acetonide (KENALOG-40) injection 40 mg -     sodium chloride flush (NS) 0.9 % injection 10 mL -     ropivacaine (PF) 2 mg/mL (0.2%) (NAROPIN)  injection 10 mL -     lidocaine (PF) (XYLOCAINE) 1 % injection 5 mL -     iopamidol (ISOVUE-M) 41 % intrathecal injection 20 mL -     dexamethasone (DECADRON) injection 10 mg        ----------------------------------------------------------------------------------------------------------------------  Problem List Items Addressed This Visit    None    Visit Diagnoses    Sciatica of left side    -  Primary   Chronic pain syndrome       Facet arthritis of lumbar region [facility])       Relevant Medications   triamcinolone acetonide (KENALOG-40) injection 40 mg   dexamethasone (DECADRON) injection 10 mg (Completed)   DDD (degenerative disc disease), lumbar       Relevant Medications   triamcinolone acetonide (KENALOG-40) injection 40 mg   dexamethasone (DECADRON) injection 10 mg (Completed)   Sacro-iliac pain       Relevant Medications   triamcinolone acetonide (KENALOG-40) injection 40 mg   dexamethasone (DECADRON) injection 10 mg (Completed)        ----------------------------------------------------------------------------------------------------------------------  1. Sciatica of left side We will proceed with a selective nerve root block left at L4.   The risks and benefits of the procedure were described in detail .  Plan is for RTC 2 months.    2. Chronic pain syndrome   3. Facet arthritis of lumbar region ([place])   4. DDD (degenerative disc disease), lumbar   5. Sacro-iliac pain     ----------------------------------------------------------------------------------------------------------------------  I am having Cassidy Hernandez maintain her amLODipine, aspirin EC, atenolol, chlorthalidone, losartan, multivitamin, nitroGLYCERIN, omeprazole, rosuvastatin, zolpidem, and losartan. We administered ropivacaine (PF) 2 mg/mL (0.2%), iopamidol, and dexamethasone.   Meds ordered this encounter  Medications  . triamcinolone acetonide (KENALOG-40) injection 40 mg  . sodium  chloride flush (NS) 0.9 % injection 10 mL  . ropivacaine (PF) 2 mg/mL (0.2%) (NAROPIN) injection 10 mL  . lidocaine (PF) (XYLOCAINE) 1 % injection 5 mL  . iopamidol (ISOVUE-M) 41 % intrathecal injection 20 mL  . dexamethasone (DECADRON) injection 10 mg   Patient's Medications  New Prescriptions   No medications on file  Previous Medications   AMLODIPINE (NORVASC) 5 MG TABLET    Take by mouth daily.    ASPIRIN EC 81 MG TABLET    Take by mouth daily.    ATENOLOL (TENORMIN) 50 MG TABLET    Take by mouth 2 (two) times daily.    CHLORTHALIDONE (HYGROTON) 25 MG TABLET    TAKE 1/2 TABLET EVERY DAY   LOSARTAN (COZAAR) 100 MG TABLET    Take 100 mg by mouth daily.    LOSARTAN (COZAAR) 100 MG TABLET    Take by mouth.   MULTIPLE VITAMIN (MULTIVITAMIN) CAPSULE  Take by mouth daily.    NITROGLYCERIN (NITROSTAT) 0.4 MG SL TABLET    Place under the tongue.   OMEPRAZOLE (PRILOSEC) 20 MG CAPSULE    Take by mouth daily.    ROSUVASTATIN (CRESTOR) 20 MG TABLET    Take by mouth daily.    ZOLPIDEM (AMBIEN) 10 MG TABLET    Take by mouth at bedtime as needed.   Modified Medications   No medications on file  Discontinued Medications   No medications on file   ----------------------------------------------------------------------------------------------------------------------  Follow-up: Return in about 2 months (around 12/09/2017) for evaluation, procedure.   Procedure: L4 Selective nerve root block fluoroscopic guidance  NOTE: The risks, benefits, and expectations of the procedure have been discussed and explained to the patient who was understanding and in agreement with suggested treatment plan. No guarantees were made.  DESCRIPTION OF PROCEDURE:  L4 Selective Nerve root block with steroid injection with no IV Versed, EKG, blood pressure, pulse, and pulse oximetry monitoring. The procedure was performed with the patient in the prone position under fluoroscopic guidance.  Sterile prep x3 was initiated and  I then injected subcutaneous lidocaine to the overlying skin above the   Left L4L5 Facet joint site after its fluoroscopic identifictation.  Using strict aseptic technique, I then advanced an 22 gauge quincke needle using a lateral  Approach.. There was negative aspiration for heme or  CSF, neg paresthesia with needle confirmation in the AP Lat and oblique views..  1.5.  cc of Isovue were injected yielding a good spread along the L4 nerve root and no evidence of vascular uptake.  This was followed by an injection of a  0.5cc Ropicaine 0.2 percent with 10mg  Decadron  injected incrementally. The needle was removed. The patient tolerated the injection well and was convalesced and discharged to home in stable condition. Should the patient have any post procedure difficulty they have been instructed on how to contact us for assistance.   Molli Barrows, MD No

## 2017-10-11 ENCOUNTER — Encounter: Payer: Self-pay | Admitting: Neurology

## 2017-10-18 ENCOUNTER — Other Ambulatory Visit: Payer: Self-pay | Admitting: Infectious Diseases

## 2017-10-18 DIAGNOSIS — Z1239 Encounter for other screening for malignant neoplasm of breast: Secondary | ICD-10-CM

## 2017-12-01 ENCOUNTER — Other Ambulatory Visit: Payer: Self-pay | Admitting: Student

## 2017-12-01 DIAGNOSIS — M5416 Radiculopathy, lumbar region: Secondary | ICD-10-CM

## 2017-12-01 DIAGNOSIS — G8929 Other chronic pain: Secondary | ICD-10-CM

## 2017-12-01 DIAGNOSIS — M545 Low back pain: Principal | ICD-10-CM

## 2017-12-07 ENCOUNTER — Ambulatory Visit: Payer: No Typology Code available for payment source

## 2017-12-07 ENCOUNTER — Ambulatory Visit: Payer: Medicare Other | Attending: Student | Admitting: Physical Therapy

## 2017-12-07 DIAGNOSIS — M5442 Lumbago with sciatica, left side: Secondary | ICD-10-CM | POA: Insufficient documentation

## 2017-12-07 DIAGNOSIS — G8929 Other chronic pain: Secondary | ICD-10-CM | POA: Insufficient documentation

## 2017-12-07 NOTE — Therapy (Signed)
[facility] [address] [place], [address] Phone: [phone]   Fax:  [phone]  Physical Therapy Treatment  Patient Details  Name: Cassidy Hernandez MRN: [id number] Date of Birth: [birth year] Referring Provider: Marin Olp   Encounter Date: 12/07/2017  PT End of Session - 12/07/17 1324    Visit Number  1    Number of Visits  17    Date for PT Re-Evaluation  02/01/18    PT Start Time  0104    PT Stop Time  0210    PT Time Calculation (min)  66 min    Activity Tolerance  Patient tolerated treatment well    Behavior During Therapy  [facility] for tasks assessed/performed       Past Medical History:  Diagnosis Date  . 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 Degenerative arthritis   . GERD (gastroesophageal reflux disease)   . H/O: hysterectomy   . Hypertension   . Kidney stone   . Subclavian steal syndrome     Past Surgical History:  Procedure Laterality Date  . ABDOMINAL HYSTERECTOMY    . AUGMENTATION MAMMAPLASTY Bilateral 1980  . 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 CHOLECYSTECTOMY    . COLONOSCOPY N/A 02/11/2016   Procedure: COLONOSCOPY;  Surgeon: Manya Silvas, MD;  Location: [facility] ENDOSCOPY;  Service: Endoscopy;  Laterality: N/A;    There were no vitals filed for this visit.  Subjective Assessment - 12/07/17 1309    Subjective  LBP w/ left side sciatica     Pertinent History  Patient is a 77 year old female with LBP that she reports radiates down the L hip and reports the radiating pain is "electrical" burning and shooting. 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 Patient reports pain has been going on for 20 years, but has been exacerbated over the past 3-4 months after lifting a sofa. Patient reports she is scheduled for an MRI Monday for the back pain. Patient reports deficits in prolonged sitting  and standing. Patient reports her worst pain in the past week 10/10 best: 0/10 at rest. Pt denies N/V, unexplained weight fluctuation, saddle paresthesia, fever, night sweats, or unrelenting night pain at this time.    Limitations  Lifting;Standing;Walking;House hold activities    How long can you sit comfortably?  Reports she can only sit in a soft chair for 45 min before pain    How long can you stand comfortably?  10-14min    How long can you walk comfortably?  73min    Diagnostic tests  MRI scheduled 12/11/17; Xray 01/17/17: Grade 1 anterolisthesis @ L4-5, disc space narrowing in L3-5, and facet arthropathy in lower lumbar spine    Patient Stated Goals  Decrease pain, walk for exercise without pain, travel without pain    Currently in Pain?  Yes    Pain Score  0-No pain    Pain Location  Back    Pain Orientation  Lower;Mid    Pain Descriptors / Indicators  Aching;Dull    Pain Type  Chronic pain    Pain Radiating Towards  Down posterior L LE with burning/shooting sensation    Pain Onset  More than a month ago    Pain Frequency  Intermittent    Aggravating Factors   Prolonged walking and standing    Pain Relieving Factors  Tylenol gives short term  pain relief     Effect of Pain on Daily Activities  Unable travel (planning trip to [place] in July), complete errands    Multiple Pain Sites  No         [facility] PT Assessment - 12/07/17 0001      Assessment   Medical Diagnosis  low back pain    Referring Provider  Marin Olp    Onset Date/Surgical Date  07/09/17    Hand Dominance  Right    Next MD Visit  MRI 12/11/17    Prior Therapy  none      Precautions   Precautions  -- Spondy      Balance Screen   Has the patient fallen in the past 6 months  No    Has the patient had a decrease in activity level because of a fear of falling?   No    Is the patient reluctant to leave their home because of a fear of falling?   No      Home Social worker  Private residence     Living Arrangements  Alone    Available Help at Discharge  Family      Prior Function   Level of [place]  Retired    Leisure  -- Traveling, walking, shopping       ROM Lumbar flexion 50% limited  Lumbar rotation R: wnl L: 25% limited with pain in L glute Lumbar side bending wnl with pain in L glute with L sidebending Lumbar ext not tested d/t anterolisthesis Hip IR 50% limited bilat Hip ext 50% limited bilat Hip add 25% limited bilat  FADIR limited with "pulling" in bilat glutes Hip flex + knee ext limited by hamstring tightness  Strength Hip flex: 4+/5 bilar Hip abd :4-/5 bilat Hip Ext in prone: L 3/5 R 4/5 Hip IR: 4+/5 bilat Hip ER: 4/5 bilat      Special Tests/ Other Reflexes L patellar/acheilles 1+ R patellar/acheilles 2+ (+) slump on L (-) on R (-) SLR with "some pain in L groin area (-) Crossed SLR (+) Elys bilaton L for hip flexor tightness (+) 90/90 bilat (+) FABER on L (+) Obers bilat (-) Scour bilat 5xSTS 15sec 6MWT 119ft   Ther-Ex -Seated piriformis stretch 4x  30sec holds (HEP) -Seated bilat glute stretch 4x 30sec holds (HEP) -Seated bilat hamstring stretch 4x 30sec holds (HEP) -Bilat Standing hip flexor stretch 4x 30sec holds (HEP) -PT assisted patient with supine sciatic nerve glide which centralized pain elicited from 6MWT -Education on PT role and there-ex role on pain and strengthening muscles surrounding the spine to increase stability and decrease pain                       PT Education - 12/07/17 1323    Education provided  Yes    Education Details  Patient was educated on diagnosis, anatomy and pathology involved, prognosis, role of PT, and was given an HEP, demonstrating exercise with proper form following verbal and tactile cues, and was given a paper hand out to continue exercise at home. Pt was educated on and agreed to plan of care.    Person(s) Educated  Patient    Methods   Explanation;Demonstration;Tactile cues;Verbal cues;Handout    Comprehension  Verbal cues required;Tactile cues required;Returned demonstration;Verbalized understanding       PT Short Term Goals - 12/07/17 1510      PT SHORT TERM  GOAL #1   Title  Pt will be independent with HEP in order to improve strength and balance in order to decrease fall risk and improve function at home     Time  2    Period  Weeks    Status  New        PT Long Term Goals - 12/07/17 1510      PT LONG TERM GOAL #1   Title   Pt will increase 6MWT by at least 47m (144ft) in order to demonstrate clinically significant improvement in cardiopulmonary endurance and community ambulation    Baseline  12/07/17: 1134ft with L radiating hip pain following    Time  8    Period  Weeks    Status  New      PT LONG TERM GOAL #2   Title  Pt will decrease worst pain as reported on NPRS by at least 3 points in order to demonstrate clinically significant reduction in pain.    Baseline  12/07/17: worst pain 10/10 with walking    Time  8    Period  Weeks    Status  New      PT LONG TERM GOAL #3   Title  Pt will decrease 5TSTS by at least 3 seconds in order to demonstrate clinically significant improvement in LE strength    Baseline  12/07/17: 15sec    Time  8    Period  Weeks    Status  New      PT LONG TERM GOAL #4   Title  Patient will increase FOTO score to 55 to demonstrate predicted increase in functional mobility to complete ADLs    Baseline  12/07/17 41    Time  8    Period  Weeks    Status  New            Plan - 12/07/17 1452    Clinical Impression Statement  Pt is a 77 year old female with chronic LBP with referral into LLE past knee to proximal calf/foot. Activity limitations in prolonged walking, prolonged standing, squatting, bending to retrieve items. Impairments including decreased truncal and hip ROM,  LBP with LLE referral, piriformis/glute/hip flexor tightness/hypertonicity, decreased core strength, and  decreased hip extensor strength. Participation restriction: walking for exercise and traveling (patient has a trip to [place] this July w/ family), and household chores (cooking, doing hair d/t prolonged standing). Pt will benefit from skilled PT intervention to address the aforementioned impairments and activity limitations for best return to PLOF.    History and Personal Factors relevant to plan of care:  HTN, Grade 1 anterolisthesis @ L4-5, disc space narrowing in L3-5, facet arthropathy in lower lumbar spine    Clinical Presentation  Evolving    Clinical Presentation due to:  2 personal factors/comorbidities, 3 body systems/activity limitations/participation restrictions     Clinical Decision Making  Moderate    Rehab Potential  Good    Clinical Impairments Affecting Rehab Potential  (+) motivation, social support (-) age, chronicity of pain, fairly sedentary lifestyle    PT Frequency  2x / week    PT Duration  8 weeks    PT Treatment/Interventions  Aquatic Therapy;Moist Heat;Functional mobility training;Dry needling;Manual techniques;Neuromuscular re-education;Passive range of motion;Traction;Ultrasound;Iontophoresis 4mg /ml Dexamethasone;ADLs/Self Care Home Management;Cryotherapy;Electrical Stimulation;Therapeutic exercise;Therapeutic activities;Patient/family education;Taping    PT Next Visit Plan  Pain management (manual and modalities), glute/hip extensor and core strengthening as tolerated    PT Home Exercise Plan  Seated piriformis, glute, and hamstring stretch, standing  hip flexor stretch, heat application 82-50 min 3x/day    Consulted and Agree with Plan of Care  Patient       Patient will benefit from skilled therapeutic intervention in order to improve the following deficits and impairments:  Increased fascial restricitons, Pain, Improper body mechanics, Impaired tone, Postural dysfunction, Decreased mobility, Decreased endurance, Decreased range of motion, Decreased strength, Decreased  activity tolerance, Difficulty walking, Impaired flexibility  Visit Diagnosis: Chronic left-sided low back pain with left-sided sciatica          Problem List There are no active problems to display for this patient.  Shelton Silvas PT, DPT Shelton Silvas 12/07/2017, 3:17 PM  [facility] [address] [place], [address] Phone: [phone]   Fax:  [phone]  Name: Cassidy Hernandez MRN: [id number] Date of Birth: 1941/08/03

## 2017-12-08 ENCOUNTER — Ambulatory Visit: Payer: No Typology Code available for payment source

## 2017-12-11 ENCOUNTER — Ambulatory Visit
Admission: RE | Admit: 2017-12-11 | Discharge: 2017-12-11 | Disposition: A | Discharge status: Home / Self Care | Payer: Medicare Other | Source: Ambulatory Visit | Attending: Student | Admitting: Student

## 2017-12-11 DIAGNOSIS — M5416 Radiculopathy, lumbar region: Secondary | ICD-10-CM

## 2017-12-11 DIAGNOSIS — G8929 Other chronic pain: Secondary | ICD-10-CM | POA: Diagnosis not present

## 2017-12-11 DIAGNOSIS — M545 Low back pain: Secondary | ICD-10-CM | POA: Insufficient documentation

## 2017-12-11 DIAGNOSIS — M48061 Spinal stenosis, lumbar region without neurogenic claudication: Secondary | ICD-10-CM | POA: Diagnosis not present

## 2017-12-12 ENCOUNTER — Ambulatory Visit: Payer: Medicare Other | Admitting: Physical Therapy

## 2017-12-12 ENCOUNTER — Encounter: Payer: Self-pay | Admitting: Physical Therapy

## 2017-12-12 ENCOUNTER — Ambulatory Visit: Payer: No Typology Code available for payment source | Admitting: Anesthesiology

## 2017-12-12 DIAGNOSIS — M5442 Lumbago with sciatica, left side: Secondary | ICD-10-CM | POA: Diagnosis not present

## 2017-12-12 DIAGNOSIS — G8929 Other chronic pain: Secondary | ICD-10-CM

## 2017-12-12 NOTE — Therapy (Signed)
[facility] [address] [place], [address] Phone: [phone]   Fax:  [phone]  Physical Therapy Treatment  Patient Details  Name: Cassidy Hernandez MRN: [id number] Date of Birth: 1941/02/11 Referring Provider: Marin Olp   Encounter Date: 12/12/2017  PT End of Session - 12/12/17 1216    Visit Number  2    Number of Visits  17    Date for PT Re-Evaluation  02/01/18    PT Start Time  1116    PT Stop Time  1158    PT Time Calculation (min)  42 min    Activity Tolerance  Patient tolerated treatment well    Behavior During Therapy  [facility] for tasks assessed/performed       Past Medical History:  Diagnosis Date  . 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 Degenerative arthritis   . GERD (gastroesophageal reflux disease)   . H/O: hysterectomy   . Hypertension   . Kidney stone   . Subclavian steal syndrome     Past Surgical History:  Procedure Laterality Date  . ABDOMINAL HYSTERECTOMY    . AUGMENTATION MAMMAPLASTY Bilateral 1980  . 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 CHOLECYSTECTOMY    . COLONOSCOPY N/A 02/11/2016   Procedure: COLONOSCOPY;  Surgeon: Manya Silvas, MD;  Location: [facility] ENDOSCOPY;  Service: Endoscopy;  Laterality: N/A;    There were no vitals filed for this visit.  Subjective Assessment - 12/12/17 1125    Subjective  Patient reports her pain increased Sunday following exercises to 10/10 in the hip with "nerve pain" down posterior leg that disallowed her from being able to go to church. Patient reports she is continuing to have this pain today 8/10 with walking. Patient reports she thinks the heat is the most helpful for her pain.     Pertinent History  Patient is a 77 year old female with LBP that she reports radiates down the L hip and reports the radiating pain is "electrical" burning and shooting. 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 Patient reports pain has been going on for 20 years, but has been exacerbated over the past 3-4 months after lifting a sofa. Patient reports she is scheduled for an MRI Monday for the back pain. Patient reports deficits in prolonged sitting and standing. Patient reports her worst pain in the past week 10/10 best: 0/10 at rest. Pt denies N/V, unexplained weight fluctuation, saddle paresthesia, fever, night sweats, or unrelenting night pain at this time.    Limitations  Lifting;Standing;Walking;House hold activities    How long can you sit comfortably?  Reports she can only sit in a soft chair for 45 min before pain    How long can you stand comfortably?  10-44min    How long can you walk comfortably?  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    Patient Stated Goals  Decrease pain, walk for exercise without pain, travel without pain    Pain Onset  More than a month ago            Manual -CPA mobs GI-II at L4-S3 30sec bouts 6 bouts each segment for pain relief at scaitic nerve roots -STM w/ trigger point release at L piriformis and glute (  prolonged time spent here as this is chief compliant) with 75% tension relief following -Manual 90/90 stretch with patient performing ankle pumps for sciatic nerve glide 4x 30sec holds increasing ROM each bout -Manual supine glute stretch 3x 30sec holds -Manual ober's stretch 3x 30sec holds Following manual techniques pt reports only 3/10 "managable pain" with ambulation    Ther-Ex -Seated glute stretch 2x 30sec holds HEP review -Standing hip flexor stretch 2x 30sec holds HEP review -Seated sciatic nerve glide x15 with extensive education on HEP revision to discontinue hamstring and piriformis at this time, adding in nerve glide, and keeping hip flexor stretch and  glute stretch. Patient required clarification on frequency of stretching and hold duration. PT educated patient to attempt seated nerve glide as "nerve pain" comes on. PT educated patient to continue utilizing heat as this is helping her pain. Education on sciatic nerve tension causes, symptoms and treatments                  PT Education - 12/12/17 1214    Education provided  Yes    Education Details  HEP update    Person(s) Educated  Patient    Methods  Explanation;Verbal cues    Comprehension  Verbalized understanding;Returned demonstration;Verbal cues required       PT Short Term Goals - 12/07/17 1510      PT SHORT TERM GOAL #1   Title  Pt will be independent with HEP in order to improve strength and balance in order to decrease fall risk and improve function at home     Time  2    Period  Weeks    Status  New        PT Long Term Goals - 12/07/17 1510      PT LONG TERM GOAL #1   Title   Pt will increase 6MWT by at least 43m (129ft) in order to demonstrate clinically significant improvement in cardiopulmonary endurance and community ambulation    Baseline  12/07/17: 1126ft with L radiating hip pain following    Time  8    Period  Weeks    Status  New      PT LONG TERM GOAL #2   Title  Pt will decrease worst pain as reported on NPRS by at least 3 points in order to demonstrate clinically significant reduction in pain.    Baseline  12/07/17: worst pain 10/10 with walking    Time  8    Period  Weeks    Status  New      PT LONG TERM GOAL #3   Title  Pt will decrease 5TSTS by at least 3 seconds in order to demonstrate clinically significant improvement in LE strength    Baseline  12/07/17: 15sec    Time  8    Period  Weeks    Status  New      PT LONG TERM GOAL #4   Title  Patient will increase FOTO score to 55 to demonstrate predicted increase in functional mobility to complete ADLs    Baseline  12/07/17 41    Time  8    Period  Weeks    Status  New             Plan - 12/12/17 1217    Clinical Impression Statement  Pt demonstratd increased tightness in L piriformis/glute which required prolonged manual techniques to subside. Pt responded well to lumbar mobilization with centralization noted. Following manual techniques patient is able  to ambulate 59ft with only 3/10 pain which she reports is "very manageable". PT reviewd HEP with patient, and noted the hamstring stretch is the most "painful" to pt. If pt is experiencing true sciatica, stretching the sciatic nerve may be causing more irritation, so PT replaced this with seated sciatic nerve glide which patient reports is not painful. Will follow up with effectiveness of new HEP next visit.                                          Clinical Impairments Affecting Rehab Potential  (+) motivation, social support (-) age, chronicity of pain, fairly sedentary lifestyle    PT Frequency  2x / week    PT Duration  8 weeks    PT Treatment/Interventions  Aquatic Therapy;Moist Heat;Functional mobility training;Dry needling;Manual techniques;Neuromuscular re-education;Passive range of motion;Traction;Ultrasound;Iontophoresis 4mg /ml Dexamethasone;ADLs/Self Care Home Management;Cryotherapy;Electrical Stimulation;Therapeutic exercise;Therapeutic activities;Patient/family education;Taping    PT Next Visit Plan  Pain management (manual and modalities), glute/hip extensor and core strengthening as tolerated    PT Home Exercise Plan  seated sciatic nerve glide, seated glute stretch,  standing hip flexor stretch, heat application 48-54 min 3x/day    Consulted and Agree with Plan of Care  Patient       Patient will benefit from skilled therapeutic intervention in order to improve the following deficits and impairments:  Increased fascial restricitons, Pain, Improper body mechanics, Impaired tone, Postural dysfunction, Decreased mobility, Decreased endurance, Decreased range of motion, Decreased strength, Decreased  activity tolerance, Difficulty walking, Impaired flexibility  Visit Diagnosis: Chronic left-sided low back pain with left-sided sciatica     Problem List There are no active problems to display for this patient.   Shelton Silvas 12/12/2017, 3:05 PM  [facility] [address], [place], [address] Phone: [phone]   Fax:  [phone]  Name: JAZIA FARACI MRN: [id number] Date of Birth: 01-May-1941

## 2017-12-14 ENCOUNTER — Ambulatory Visit: Payer: Medicare Other

## 2017-12-15 ENCOUNTER — Encounter: Payer: No Typology Code available for payment source | Admitting: Physical Therapy

## 2017-12-19 ENCOUNTER — Ambulatory Visit: Payer: Medicare Other | Admitting: Physical Therapy

## 2017-12-19 ENCOUNTER — Encounter: Payer: Self-pay | Admitting: Physical Therapy

## 2017-12-19 DIAGNOSIS — G8929 Other chronic pain: Secondary | ICD-10-CM

## 2017-12-19 DIAGNOSIS — M5442 Lumbago with sciatica, left side: Principal | ICD-10-CM

## 2017-12-19 NOTE — Therapy (Signed)
[facility] [address] [place], [address] Phone: [phone]   Fax:  [phone]  Physical Therapy Treatment  Patient Details  Name: Cassidy Hernandez MRN: [id number] Date of Birth: 1940-10-25 Referring Provider: Marin Olp   Encounter Date: 12/19/2017  PT End of Session - 12/19/17 0946    Visit Number  3    Number of Visits  17    Date for PT Re-Evaluation  02/01/18    Activity Tolerance  Patient tolerated treatment well    Behavior During Therapy  [facility] for tasks assessed/performed       Past Medical History:  Diagnosis Date  . 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 Degenerative arthritis   . GERD (gastroesophageal reflux disease)   . H/O: hysterectomy   . Hypertension   . Kidney stone   . Subclavian steal syndrome     Past Surgical History:  Procedure Laterality Date  . ABDOMINAL HYSTERECTOMY    . AUGMENTATION MAMMAPLASTY Bilateral 1980  . 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 CHOLECYSTECTOMY    . COLONOSCOPY N/A 02/11/2016   Procedure: COLONOSCOPY;  Surgeon: Manya Silvas, MD;  Location: [facility] ENDOSCOPY;  Service: Endoscopy;  Laterality: N/A;    There were no vitals filed for this visit.  Subjective Assessment - 12/19/17 0934    Subjective  Patient reports she recieved results of her MRI yesterday, which revealed moderate spinal stenosis moderate spinal stenosis L3-4 with subarticular stenosis on the right and moderate spinal stenosis L4-5 with severe subarticular foraminal. Patient reports over the past week her pain has continued to increase at LB and left posterior hip into calf, in the am, but subsides after "a while of walking around". Patient reports 9/10 pain when she wakes up in the morning, and that it comes down to about a 3 at the lowest after walking around for a while. Patient reports 4/10 pain walking into the clinic today. Patient reports  compliance with her HEP    Pertinent History  Patient is a 77 year old female with LBP that she reports radiates down the L hip and reports the radiating pain is "electrical" burning and shooting. 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 Patient reports pain has been going on for 20 years, but has been exacerbated over the past 3-4 months after lifting a sofa. Patient reports she is scheduled for an MRI Monday for the back pain. Patient reports deficits in prolonged sitting and standing. Patient reports her worst pain in the past week 10/10 best: 0/10 at rest. Pt denies N/V, unexplained weight fluctuation, saddle paresthesia, fever, night sweats, or unrelenting night pain at this time.    Limitations  Lifting;Standing;Walking;House hold activities    How long can you sit comfortably?  Reports she can only sit in a soft chair for 45 min before pain    How long can you stand comfortably?  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    Patient Stated Goals  Decrease pain, walk for exercise without pain, travel without pain    Pain Onset  More than a month ago  Manual -STM w/ trigger point release at L piriformis and glute (prolonged time spent here as this is chief compliant) with 75% tension relief following -STM w/ trigger point release at bilat lumbar paraspinals L>R with 75% tension relief following -Manual 90/90 stretch bilat 30sec holds x3 -L hamstring contract-relax 10sec contract 20sec relax with stretch -Manual supine L glute stretch 3x 30sec holds  Ther-Ex -Education on repeated flexion for pain reduction with spinal stenosis. Patient ambulated 3ft  with pain increased to 4/10 and after 15 repeated ext reported only 1/10 pain. Education given on anatomy and physiology involved, and the importance of core strengthening and posterior spine/hip tension release on proper length/tension relationship -Posterior pelvic 3x 15 with 3 sec holds and cuing for eccentric control and TC needed initially for proper form -TA marches 3x 10 (5e foot) with demo and TC initially needed for proper form -Lower lumbar rotations 20x                          PT Education - 12/19/17 1028    Education provided  Yes    Education Details  Stenosis anatomy and physiology involved and purpose of core strengthening; and repeated flexion    Person(s) Educated  Patient    Methods  Explanation;Verbal cues;Handout;Tactile cues;Demonstration    Comprehension  Verbal cues required;Returned demonstration;Verbalized understanding;Tactile cues required       PT Short Term Goals - 12/07/17 1510      PT SHORT TERM GOAL #1   Title  Pt will be independent with HEP in order to improve strength and balance in order to decrease fall risk and improve function at home     Time  2    Period  Weeks    Status  New        PT Long Term Goals - 12/07/17 1510      PT LONG TERM GOAL #1   Title   Pt will increase 6MWT by at least 36m (142ft) in order to demonstrate clinically significant improvement in cardiopulmonary endurance and community ambulation    Baseline  12/07/17: 1166ft with L radiating hip pain following    Time  8    Period  Weeks    Status  New      PT LONG TERM GOAL #2   Title  Pt will decrease worst pain as reported on NPRS by at least 3 points in order to demonstrate clinically significant reduction in pain.    Baseline  12/07/17: worst pain 10/10 with walking    Time  8    Period  Weeks    Status  New      PT LONG TERM GOAL #3   Title  Pt will decrease 5TSTS by at least 3 seconds in order to demonstrate clinically significant improvement in  LE strength    Baseline  12/07/17: 15sec    Time  8    Period  Weeks    Status  New      PT LONG TERM GOAL #4   Title  Patient will increase FOTO score to 55 to demonstrate predicted increase in functional mobility to complete ADLs    Baseline  12/07/17 41    Time  8    Period  Weeks    Status  New              Patient will benefit from skilled therapeutic intervention in order to improve the following deficits and impairments:  Visit Diagnosis: Chronic left-sided low back pain with left-sided sciatica     Problem List There are no active problems to display for this patient.  Shelton Silvas PT, DPT Shelton Silvas 12/19/2017, 12:29 PM  [facility] [address], [place], [address] Phone: [phone]   Fax:  [phone]  Name: HARRIET SUTPHEN MRN: [id number] Date of Birth: Aug 10, 1940

## 2017-12-21 ENCOUNTER — Encounter: Payer: No Typology Code available for payment source | Admitting: Physical Therapy

## 2017-12-25 ENCOUNTER — Encounter: Payer: Self-pay | Admitting: Physical Therapy

## 2017-12-25 ENCOUNTER — Ambulatory Visit: Payer: Medicare Other | Admitting: Physical Therapy

## 2017-12-25 DIAGNOSIS — G8929 Other chronic pain: Secondary | ICD-10-CM

## 2017-12-25 DIAGNOSIS — M5442 Lumbago with sciatica, left side: Principal | ICD-10-CM

## 2017-12-25 NOTE — Therapy (Signed)
[facility] [address], [place], [address] Phone: [phone]   Fax:  [phone]  Physical Therapy Treatment  Patient Details  Name: Cassidy Hernandez MRN: [id number] Date of Birth: 03/08/1941 Referring Provider: Marin Olp   Encounter Date: 12/25/2017  PT End of Session - 12/25/17 1408    Visit Number  4    Number of Visits  17    Date for PT Re-Evaluation  02/01/18    PT Start Time  0145    PT Stop Time  0230    PT Time Calculation (min)  45 min    Activity Tolerance  Patient tolerated treatment well    Behavior During Therapy  [facility] for tasks assessed/performed       Past Medical History:  Diagnosis Date  . 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 Degenerative arthritis   . GERD (gastroesophageal reflux disease)   . H/O: hysterectomy   . Hypertension   . Kidney stone   . Subclavian steal syndrome     Past Surgical History:  Procedure Laterality Date  . ABDOMINAL HYSTERECTOMY    . AUGMENTATION MAMMAPLASTY Bilateral 1980  . 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 CHOLECYSTECTOMY    . COLONOSCOPY N/A 02/11/2016   Procedure: COLONOSCOPY;  Surgeon: Manya Silvas, MD;  Location: [facility] ENDOSCOPY;  Service: Endoscopy;  Laterality: N/A;    There were no vitals filed for this visit.  Subjective Assessment - 12/25/17 1354    Subjective  Patient reports she was able to walk around a little better at her grandson's graduation this weekend. Patient reports she is having less pain in the morning, which she reports is an improvement. Patient reports her worst pain over the weekend was a 5/10.     Pertinent History  Patient is a 77 year old female with LBP that she reports radiates down the L hip and reports the radiating pain is "electrical" burning and shooting. 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 Patient  reports pain has been going on for 20 years, but has been exacerbated over the past 3-4 months after lifting a sofa. Patient reports she is scheduled for an MRI Monday for the back pain. Patient reports deficits in prolonged sitting and standing. Patient reports her worst pain in the past week 10/10 best: 0/10 at rest. Pt denies N/V, unexplained weight fluctuation, saddle paresthesia, fever, night sweats, or unrelenting night pain at this time.    Limitations  Lifting;Standing;Walking;House hold activities    How long can you sit comfortably?  Reports she can only sit in a soft chair for 45 min before pain    How long can you stand comfortably?  10-48min    How long can you walk comfortably?  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    Patient Stated Goals  Decrease pain, walk for exercise without pain, travel without pain    Pain Onset  More than a month ago       Manual -STM w/trigger point releaseat L piriformis and glute (prolonged time spent here as this is chief compliant) with 75% tension relief following -STM  w/trigger point releaseat bilat lumbar paraspinals L>R with 75% tension relief following -L hamstring contract-relax 10sec contract 20sec relax with stretch -Manual supine L glute stretch 3x 30sec holds -Manual FABER stretch 2x 80min w/ PT overpressure  Ther-Ex -Bridge exercise 3x 8 with patient reporting some paraspinal activation with first set so PT instructed prior 5x glute set, which patient reports decreases paraspinal activation -TA marches 3x 10each  with demo from PT for form correction -Lower lumbar rotations 20x with min cuing to keep pelvis in contact with  mat                        PT Education - 12/25/17 1404    Education provided  Yes    Education Details  Exercise form    Person(s) Educated  Patient    Methods  Explanation;Verbal cues    Comprehension  Verbal cues required;Verbalized understanding       PT Short Term Goals - 12/07/17 1510      PT SHORT TERM GOAL #1   Title  Pt will be independent with HEP in order to improve strength and balance in order to decrease fall risk and improve function at home     Time  2    Period  Weeks    Status  New        PT Long Term Goals - 12/07/17 1510      PT LONG TERM GOAL #1   Title   Pt will increase 6MWT by at least 4m (128ft) in order to demonstrate clinically significant improvement in cardiopulmonary endurance and community ambulation    Baseline  12/07/17: 1186ft with L radiating hip pain following    Time  8    Period  Weeks    Status  New      PT LONG TERM GOAL #2   Title  Pt will decrease worst pain as reported on NPRS by at least 3 points in order to demonstrate clinically significant reduction in pain.    Baseline  12/07/17: worst pain 10/10 with walking    Time  8    Period  Weeks    Status  New      PT LONG TERM GOAL #3   Title  Pt will decrease 5TSTS by at least 3 seconds in order to demonstrate clinically significant improvement in LE strength    Baseline  12/07/17: 15sec    Time  8    Period  Weeks    Status  New      PT LONG TERM GOAL #4   Title  Patient will increase FOTO score to 55 to demonstrate predicted increase in functional mobility to complete ADLs    Baseline  12/07/17 41    Time  8    Period  Weeks    Status  New            Plan - 12/25/17 1424    Clinical Impression Statement  Pt returns today with increased pain following grandson's graduation over the weekend, requiring prolonged manual techniques to modulate pain. PT was still able to progress therex following manual techniques, which patient was able to tolerate well  with no increased pain only noted fatigue, with min cuing needed for proper form    PT Treatment/Interventions  Aquatic Therapy;Moist Heat;Functional mobility training;Dry needling;Manual techniques;Neuromuscular re-education;Passive range of motion;Traction;Ultrasound;Iontophoresis 4mg /ml Dexamethasone;ADLs/Self Care Home Management;Cryotherapy;Electrical Stimulation;Therapeutic exercise;Therapeutic activities;Patient/family education;Taping    PT Next Visit Plan  Pain management (manual  and modalities), glute/hip extensor and core strengthening as tolerated    PT Home Exercise Plan  5/14: repeated flexion; eval: seated sciatic nerve glide, seated glute stretch,  standing hip flexor stretch, heat application 74-25 min 3x/day    Consulted and Agree with Plan of Care  Patient       Patient will benefit from skilled therapeutic intervention in order to improve the following deficits and impairments:  Increased fascial restricitons, Pain, Improper body mechanics, Impaired tone, Postural dysfunction, Decreased mobility, Decreased endurance, Decreased range of motion, Decreased strength, Decreased activity tolerance, Difficulty walking, Impaired flexibility  Visit Diagnosis: Chronic left-sided low back pain with left-sided sciatica     Problem List There are no active problems to display for this patient.  Shelton Silvas PT, DPT Shelton Silvas 12/25/2017, 2:34 PM  [facility] [address] [place], [address] Phone: [phone]   Fax:  [phone]  Name: NAOMIE CROW MRN: [id number] Date of Birth: 02-14-1941

## 2017-12-28 ENCOUNTER — Encounter: Payer: Self-pay | Admitting: Physical Therapy

## 2017-12-28 ENCOUNTER — Ambulatory Visit: Payer: Medicare Other | Admitting: Physical Therapy

## 2017-12-28 DIAGNOSIS — M5442 Lumbago with sciatica, left side: Principal | ICD-10-CM

## 2017-12-28 DIAGNOSIS — G8929 Other chronic pain: Secondary | ICD-10-CM

## 2017-12-28 NOTE — Therapy (Signed)
[facility] [address], [place], [address] Phone: [phone]   Fax:  [phone]  Physical Therapy Treatment  Patient Details  Name: Cassidy Hernandez MRN: [id number] Date of Birth: 07/12/1941 Referring Provider: Marin Olp   Encounter Date: 12/28/2017  PT End of Session - 12/28/17 1433    Visit Number  5    Number of Visits  17    Date for PT Re-Evaluation  02/01/18    PT Start Time  0145    PT Stop Time  0230    PT Time Calculation (min)  45 min    Activity Tolerance  Patient tolerated treatment well    Behavior During Therapy  [facility] for tasks assessed/performed       Past Medical History:  Diagnosis Date  . 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 Degenerative arthritis   . GERD (gastroesophageal reflux disease)   . H/O: hysterectomy   . Hypertension   . Kidney stone   . Subclavian steal syndrome     Past Surgical History:  Procedure Laterality Date  . ABDOMINAL HYSTERECTOMY    . AUGMENTATION MAMMAPLASTY Bilateral 1980  . 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 CHOLECYSTECTOMY    . COLONOSCOPY N/A 02/11/2016   Procedure: COLONOSCOPY;  Surgeon: Manya Silvas, MD;  Location: [facility] ENDOSCOPY;  Service: Endoscopy;  Laterality: N/A;    There were no vitals filed for this visit.  Subjective Assessment - 12/28/17 1351    Subjective  Patient reports her pain is increased today, and reports she had a lot of pain getting in/out of the shower this am and had to lay down for 10-15 mins following. Patient reports only 3/10 pain this afternoon. Patient reports she had decreased pain following Tuesday's session for 24 hours. Patient reports she is getting "some pain relief" with repeated flexion    Pertinent History  Patient is a 77 year old female with LBP that she reports radiates down the L hip and reports the radiating pain is "electrical" burning and shooting. 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 Patient reports pain has been going on for 20 years, but has been exacerbated over the past 3-4 months after lifting a sofa. Patient reports she is scheduled for an MRI Monday for the back pain. Patient reports deficits in prolonged sitting and standing. Patient reports her worst pain in the past week 10/10 best: 0/10 at rest. Pt denies N/V, unexplained weight fluctuation, saddle paresthesia, fever, night sweats, or unrelenting night pain at this time.    Limitations  Lifting;Standing;Walking;House hold activities    How long can you sit comfortably?  Reports she can only sit in a soft chair for 45 min before pain    How long can you stand comfortably?  10-65min    How long can you walk comfortably?  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    Patient Stated Goals  Decrease pain, walk for exercise without pain, travel without pain    Pain Onset  More than a month ago          Manual -STM w/trigger point releaseat  L piriformis and glute (prolonged time spent here as this is chief compliant) with 75% tension relief following -STM w/trigger point releaseatbilat lumbar paraspinals L>Rwith 75% tension relief following - L4-S3 CPA grade I-II 30sec bouts 4 bouts each segment for pain modulation - L4-S3 R UPA grade III-IV 30sec bouts 4 bouts each segment to increase L lumbar rotation -Manual supine glute stretch (following therex) 3x 30sec holds  Ther-Ex -Prone hip ext 3x 8e with cuing for eccentric control, patient able to utilize 50% of available passive ROM with cuing for proper glute contraction without lumbar  involvement -Bridge 3x 9 with min cuing for proper ROM, with patient reporting no lumbar activation, only glute                         PT Education - 12/28/17 1353    Education provided  Yes    Education Details  Exercise form    Person(s) Educated  Patient    Methods  Explanation;Verbal cues    Comprehension  Verbalized understanding;Returned demonstration       PT Short Term Goals - 12/07/17 1510      PT SHORT TERM GOAL #1   Title  Pt will be independent with HEP in order to improve strength and balance in order to decrease fall risk and improve function at home     Time  2    Period  Weeks    Status  New        PT Long Term Goals - 12/07/17 1510      PT LONG TERM GOAL #1   Title   Pt will increase 6MWT by at least 1m (171ft) in order to demonstrate clinically significant improvement in cardiopulmonary endurance and community ambulation    Baseline  12/07/17: 1161ft with L radiating hip pain following    Time  8    Period  Weeks    Status  New      PT LONG TERM GOAL #2   Title  Pt will decrease worst pain as reported on NPRS by at least 3 points in order to demonstrate clinically significant reduction in pain.    Baseline  12/07/17: worst pain 10/10 with walking    Time  8    Period  Weeks    Status  New      PT LONG TERM GOAL #3   Title  Pt will decrease 5TSTS by at least 3 seconds in order to demonstrate clinically significant improvement in LE strength    Baseline  12/07/17: 15sec    Time  8    Period  Weeks    Status  New      PT LONG TERM GOAL #4   Title  Patient will increase FOTO score to 55 to demonstrate predicted increase in functional mobility to complete ADLs    Baseline  12/07/17 41    Time  8    Period  Weeks    Status  New            Plan - 12/28/17 1434    Clinical Impression Statement  Patient reports no pain following manual techniques, reporting it is "a miracle". PT encouraged patient to continue HEP diligently to  maintain this progress. PT led patient through slight progression in hip ext therex, which patient was able to complete with no increased pain, only muscle fatigue. PT spent time educating patient on the importance of supportive footwear, and advised patient to not  wear her heels for a few days to see if this helps her pain.     Rehab Potential  Good    Clinical Impairments Affecting Rehab Potential  (+) motivation, social support (-) age, chronicity of pain, fairly sedentary lifestyle    PT Frequency  2x / week    PT Duration  8 weeks    PT Treatment/Interventions  Aquatic Therapy;Moist Heat;Functional mobility training;Dry needling;Manual techniques;Neuromuscular re-education;Passive range of motion;Traction;Ultrasound;Iontophoresis 4mg /ml Dexamethasone;ADLs/Self Care Home Management;Cryotherapy;Electrical Stimulation;Therapeutic exercise;Therapeutic activities;Patient/family education;Taping    PT Next Visit Plan  Assess pain response to ceasing wearing heels, Pain management (manual and modalities), glute/hip extensor and core strengthening as tolerated    PT Home Exercise Plan  5/14: repeated flexion; eval: seated sciatic nerve glide, seated glute stretch,  standing hip flexor stretch, heat application 09-81 min 3x/day    Consulted and Agree with Plan of Care  Patient       Patient will benefit from skilled therapeutic intervention in order to improve the following deficits and impairments:  Increased fascial restricitons, Pain, Improper body mechanics, Impaired tone, Postural dysfunction, Decreased mobility, Decreased endurance, Decreased range of motion, Decreased strength, Decreased activity tolerance, Difficulty walking, Impaired flexibility  Visit Diagnosis: Chronic left-sided low back pain with left-sided sciatica     Problem List There are no active problems to display for this patient.  Shelton Silvas PT, DPT  Shelton Silvas 12/28/2017, 2:37 PM  [facility] [address] [place], [address] Phone: [phone]   Fax:  [phone]  Name: GREIDY SHERARD MRN: [id number] Date of Birth: 1941-01-23

## 2018-01-02 ENCOUNTER — Encounter: Payer: Self-pay | Admitting: Physical Therapy

## 2018-01-02 ENCOUNTER — Ambulatory Visit: Payer: Medicare Other | Admitting: Physical Therapy

## 2018-01-02 ENCOUNTER — Ambulatory Visit
Admission: RE | Admit: 2018-01-02 | Discharge: 2018-01-02 | Disposition: A | Discharge status: Home / Self Care | Payer: Medicare Other | Source: Ambulatory Visit | Attending: Infectious Diseases | Admitting: Infectious Diseases

## 2018-01-02 DIAGNOSIS — Z1231 Encounter for screening mammogram for malignant neoplasm of breast: Secondary | ICD-10-CM | POA: Insufficient documentation

## 2018-01-02 DIAGNOSIS — G8929 Other chronic pain: Secondary | ICD-10-CM

## 2018-01-02 DIAGNOSIS — Z1239 Encounter for other screening for malignant neoplasm of breast: Secondary | ICD-10-CM

## 2018-01-02 DIAGNOSIS — M5442 Lumbago with sciatica, left side: Secondary | ICD-10-CM | POA: Diagnosis not present

## 2018-01-02 NOTE — Therapy (Signed)
[facility] [address], [place], [address] Phone: [phone]   Fax:  [phone]  Physical Therapy Treatment  Patient Details  Name: Cassidy Hernandez MRN: [id number] Date of Birth: 1940-11-11 Referring Provider: Marin Olp   Encounter Date: 01/02/2018  PT End of Session - 01/02/18 1308    Visit Number  6    Number of Visits  17    Date for PT Re-Evaluation  02/01/18    PT Start Time  1307    PT Stop Time  1348    PT Time Calculation (min)  41 min    Activity Tolerance  Patient tolerated treatment well    Behavior During Therapy  [facility] for tasks assessed/performed       Past Medical History:  Diagnosis Date  . 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 Degenerative arthritis   . GERD (gastroesophageal reflux disease)   . H/O: hysterectomy   . Hypertension   . Kidney stone   . Subclavian steal syndrome     Past Surgical History:  Procedure Laterality Date  . ABDOMINAL HYSTERECTOMY    . AUGMENTATION MAMMAPLASTY Bilateral 1980  . 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 CHOLECYSTECTOMY    . COLONOSCOPY N/A 02/11/2016   Procedure: COLONOSCOPY;  Surgeon: Manya Silvas, MD;  Location: [facility] ENDOSCOPY;  Service: Endoscopy;  Laterality: N/A;    There were no vitals filed for this visit.  Subjective Assessment - 01/02/18 1310    Subjective  Pt reports her back and L hip are bothering her today.  Usually by this time of day her pain is decreasing.  Pt denies doing any unusual or different activity to cause pain to linger.  Pt has been completing her HEP each day but has not yet done these today.      Pertinent History  Patient is a 77 year old female with LBP that she reports radiates down the L hip and reports the radiating pain is "electrical" burning and shooting. 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 Patient  reports pain has been going on for 20 years, but has been exacerbated over the past 3-4 months after lifting a sofa. Patient reports she is scheduled for an MRI Monday for the back pain. Patient reports deficits in prolonged sitting and standing. Patient reports her worst pain in the past week 10/10 best: 0/10 at rest. Pt denies N/V, unexplained weight fluctuation, saddle paresthesia, fever, night sweats, or unrelenting night pain at this time.    Limitations  Lifting;Standing;Walking;House hold activities    How long can you sit comfortably?  Reports she can only sit in a soft chair for 45 min before pain    How long can you stand comfortably?  10-30min    How long can you walk comfortably?  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  Yes    Pain Score  4     Pain Location  Back and L hip    Pain Orientation  Left    Pain Descriptors / Indicators  Aching    Pain Type  Chronic pain    Pain Onset  More than a month ago        TREATMENT   STM w/ trigger point release at L piriformis and glute with referred pain down posterior LLE down to her ankle, thus more time spent on this region as it reproduced pt's pain.   STM w/ trigger point release at bilat lumbar paraspinals L>R   Prone hip ext 2x10 each LE with cuing for glute squeeze before extension for greater glute recruitment   Manual piriformis stretch with pt in supine 3x30 seconds LLE   Single knee to chest with 10 second holds x10 each LE   Bridge x15 with cues for glute squeeze for greater glute recruitment   Lower lumbar trunk rotations x10 L and R  with cues to control movement to work on motor control while performing a gentle stretch   Bil hip Abd/ER with RTB around knees with cues for core activation throughout with pt performing posterior pelvic tilt x20   Log roll technique for supine>sit with cues for back protection. Verbal instructions for technique to return to supine. Pt declined handout with instructions.   Prayer stretch forward onto theraball in sitting with 10 second holds x10     Pt reports she no longer has pain in her back at the end of the session, only some pain in her L buttocks region.       PT Education - 01/02/18 1308    Education provided  Yes    Education Details  Exercise technique    Person(s) Educated  Patient    Methods  Explanation;Demonstration;Verbal cues    Comprehension  Verbalized understanding;Returned demonstration;Verbal cues required;Need further instruction       PT Short Term Goals - 12/07/17 1510      PT SHORT TERM GOAL #1   Title  Pt will be independent with HEP in order to improve strength and balance in order to decrease fall risk and improve function at home     Time  2    Period  Weeks    Status  New        PT Long Term Goals - 12/07/17 1510      PT LONG TERM GOAL #1   Title   Pt will increase 6MWT by at least 59m (129ft) in order to demonstrate clinically significant improvement in cardiopulmonary endurance and community ambulation    Baseline  12/07/17: 1168ft with L radiating hip pain following    Time  8    Period  Weeks    Status  New      PT LONG TERM GOAL #2   Title  Pt will decrease worst pain as reported on NPRS by at least 3 points in order to demonstrate clinically significant reduction in pain.    Baseline  12/07/17: worst pain 10/10 with walking    Time  8    Period  Weeks    Status  New      PT LONG TERM GOAL #3   Title  Pt will decrease 5TSTS by at least 3 seconds in order to demonstrate clinically significant improvement in LE strength    Baseline   12/07/17: 15sec    Time  8    Period  Weeks    Status  New      PT LONG TERM GOAL #4   Title  Patient will  increase FOTO score to 55 to demonstrate predicted increase in functional mobility to complete ADLs    Baseline  12/07/17 41    Time  8    Period  Weeks    Status  New            Plan - 01/02/18 1313    Clinical Impression Statement  Pt demonstrated significant trigger points and increased tension in L glute and piriformis musculature with pain reproduced with trigger point and STM to this region. Pt reported partial relief of her pain following this. Pt with very minimal tightness in Bil lumbar paraspinals so not much time focused on STM to this region, this appears to have improved since evaluation.  Pt continues to demonstrate significant glute weakness BLE and will benefit from continued efforts with strengthening.  Pt demonstrates tightness in lower back and hips and thus focused efforts on stretching to reduce tension placed on low back and hip.  Pt will benefit from continued skilled PT interventions for decreased pain and improved QOL.     Rehab Potential  Good    Clinical Impairments Affecting Rehab Potential  (+) motivation, social support (-) age, chronicity of pain, fairly sedentary lifestyle    PT Frequency  2x / week    PT Duration  8 weeks    PT Treatment/Interventions  Aquatic Therapy;Moist Heat;Functional mobility training;Dry needling;Manual techniques;Neuromuscular re-education;Passive range of motion;Traction;Ultrasound;Iontophoresis 4mg /ml Dexamethasone;ADLs/Self Care Home Management;Cryotherapy;Electrical Stimulation;Therapeutic exercise;Therapeutic activities;Patient/family education;Taping    PT Next Visit Plan  Assess pain response to ceasing wearing heels, Pain management (manual and modalities), glute/hip extensor and core strengthening as tolerated    PT Home Exercise Plan  5/14: repeated flexion; eval: seated sciatic nerve glide, seated glute stretch,   standing hip flexor stretch, heat application 25-42 min 3x/day    Consulted and Agree with Plan of Care  Patient       Patient will benefit from skilled therapeutic intervention in order to improve the following deficits and impairments:  Increased fascial restricitons, Pain, Improper body mechanics, Impaired tone, Postural dysfunction, Decreased mobility, Decreased endurance, Decreased range of motion, Decreased strength, Decreased activity tolerance, Difficulty walking, Impaired flexibility  Visit Diagnosis: Chronic left-sided low back pain with left-sided sciatica     Problem List There are no active problems to display for this patient.   Collie Siad PT, DPT 01/02/2018, 1:46 PM  [facility] [address] [place], [address] Phone: [phone]   Fax:  [phone]  Name: Cassidy Hernandez MRN: [id number] Date of Birth: Apr 10, 1941

## 2018-01-04 ENCOUNTER — Ambulatory Visit: Payer: Medicare Other | Admitting: Physical Therapy

## 2018-01-08 ENCOUNTER — Ambulatory Visit: Payer: Medicare Other | Admitting: Physical Therapy

## 2018-01-09 ENCOUNTER — Encounter: Payer: Self-pay | Admitting: Anesthesiology

## 2018-01-09 ENCOUNTER — Other Ambulatory Visit: Payer: Self-pay | Admitting: Anesthesiology

## 2018-01-09 ENCOUNTER — Other Ambulatory Visit: Payer: Self-pay

## 2018-01-09 ENCOUNTER — Ambulatory Visit: Admission: RE | Admit: 2018-01-09 | Payer: Medicare Other | Source: Ambulatory Visit

## 2018-01-09 ENCOUNTER — Ambulatory Visit: Payer: Medicare Other | Attending: Anesthesiology | Admitting: Anesthesiology

## 2018-01-09 VITALS — BP 183/66 | HR 73 | Temp 97.9°F | Resp 18 | Ht 63.0 in | Wt 180.0 lb

## 2018-01-09 DIAGNOSIS — M5136 Other intervertebral disc degeneration, lumbar region: Secondary | ICD-10-CM | POA: Diagnosis not present

## 2018-01-09 DIAGNOSIS — M5432 Sciatica, left side: Secondary | ICD-10-CM | POA: Diagnosis not present

## 2018-01-09 DIAGNOSIS — Z79899 Other long term (current) drug therapy: Secondary | ICD-10-CM | POA: Diagnosis not present

## 2018-01-09 DIAGNOSIS — M5116 Intervertebral disc disorders with radiculopathy, lumbar region: Secondary | ICD-10-CM | POA: Diagnosis not present

## 2018-01-09 DIAGNOSIS — M47817 Spondylosis without myelopathy or radiculopathy, lumbosacral region: Secondary | ICD-10-CM

## 2018-01-09 DIAGNOSIS — G894 Chronic pain syndrome: Secondary | ICD-10-CM | POA: Diagnosis not present

## 2018-01-09 DIAGNOSIS — M47816 Spondylosis without myelopathy or radiculopathy, lumbar region: Secondary | ICD-10-CM

## 2018-01-09 DIAGNOSIS — M51369 Other intervertebral disc degeneration, lumbar region without mention of lumbar back pain or lower extremity pain: Secondary | ICD-10-CM

## 2018-01-09 DIAGNOSIS — Z7982 Long term (current) use of aspirin: Secondary | ICD-10-CM | POA: Insufficient documentation

## 2018-01-09 DIAGNOSIS — M545 Low back pain: Secondary | ICD-10-CM | POA: Diagnosis present

## 2018-01-09 DIAGNOSIS — R52 Pain, unspecified: Secondary | ICD-10-CM

## 2018-01-09 NOTE — Progress Notes (Signed)
Subjective:  Patient ID: Cassidy Hernandez, female    DOB: 15-Apr-1941  Age: 77 y.o. MRN: [id number]  CC: Back Pain (low) and Hip Pain (left)   Procedure: None  HPI [name] presents for reevaluation.  This borders had a very difficult time with her low back and left hip and left posterior lateral leg pain.  In the past she has had previous facet injections that gave her 50% improvement in her left lower leg pain.  She most recently had a selective nerve root block on the left side which seemed to aggravate her pain.  In the past she is also had epidural steroids that gave her some transient relief but nothing is been as effective as the facet injection she reports.  Otherwise she is in her usual state of health today.  The pain bothers her most notably for the first hour she is up in the morning and throughout the day but seems to be better late at night.  She seems to sleep okay.  She is not taking any significant pain medications for relief at this point.  She has been doing physical therapy exercises without significant improvement in her pain and she has failed conservative therapy.  Outpatient Medications Prior to Visit  Medication Sig Dispense Refill  . amLODipine (NORVASC) 5 MG tablet Take by mouth daily.     [name] aspirin EC 81 MG tablet Take by mouth daily.     [name] atenolol (TENORMIN) 50 MG tablet Take by mouth 2 (two) times daily.     . chlorthalidone (HYGROTON) 25 MG tablet TAKE 1/2 TABLET EVERY DAY    . losartan (COZAAR) 100 MG tablet Take 100 mg by mouth daily.     . Multiple Vitamin (MULTIVITAMIN) capsule Take by mouth daily.     . nitroGLYCERIN (NITROSTAT) 0.4 MG SL tablet Place under the tongue.    [name] omeprazole (PRILOSEC) 20 MG capsule Take by mouth daily.     . rosuvastatin (CRESTOR) 20 MG tablet Take by mouth daily.     . diphenhydramine-acetaminophen (TYLENOL PM EXTRA STRENGTH) 25-500 MG TABS tablet Take by mouth.    . losartan (COZAAR) 100 MG tablet Take by mouth.    .  zolpidem (AMBIEN) 10 MG tablet Take by mouth at bedtime as needed.      No facility-administered medications prior to visit.     Review of Systems CNS: No confusion or sedation Cardiac: No angina or palpitations GI: No abdominal pain or constipation Constitutional: No nausea vomiting fevers or chills  Objective:  BP (!) 183/66   Pulse 73   Temp 97.9 F (36.6 C)   Resp 18   Ht 5\' 3"  (1.6 m)   Wt 180 lb (81.6 kg)   SpO2 96%   BMI 31.89 kg/m    BP Readings from Last 3 Encounters:  01/09/18 (!) 183/66  10/09/17 (!) 200/81  09/13/17 (!) 150/85     Wt Readings from Last 3 Encounters:  01/09/18 180 lb (81.6 kg)  10/09/17 180 lb (81.6 kg)  09/13/17 180 lb (81.6 kg)     Physical Exam Pt is alert and oriented PERRL EOMI HEART IS RRR no murmur or rub LCTA no wheezing or rales MUSCULOSKELETAL reveals persistent paraspinous muscle tenderness in the lumbar region.  She walks with a mildly antalgic gait with slight difficulty going from seated to standing.  Her muscle tone and bulk is good.  Labs  No results found for: HGBA1C Lab Results  Component  Value Date   CREATININE 1.09 04/19/2014    -------------------------------------------------------------------------------------------------------------------- Lab Results  Component Value Date   WBC 13.2 (H) 04/19/2014   HGB 15.3 04/19/2014   HCT 46.4 04/19/2014   PLT 289 04/19/2014   GLUCOSE 131 (H) 04/19/2014   ALT 71 (H) 04/19/2014   AST 26 04/19/2014   NA 136 04/19/2014   K 3.5 04/19/2014   CL 102 04/19/2014   CREATININE 1.09 04/19/2014   BUN 21 (H) 04/19/2014   CO2 26 04/19/2014    --------------------------------------------------------------------------------------------------------------------- Mm Screening Breast W/implant Tomo Bilateral  Result Date: 01/02/2018 CLINICAL DATA:  Screening. EXAM: DIGITAL SCREENING BILATERAL MAMMOGRAM WITH IMPLANTS, CAD AND TOMO The patient has retroglandular implants.  Standard and implant displaced views were performed. COMPARISON:  Previous exam(s). ACR Breast Density Category b: There are scattered areas of fibroglandular density. FINDINGS: There are no findings suspicious for malignancy. Images were processed with CAD. IMPRESSION: No mammographic evidence of malignancy. A result letter of this screening mammogram will be mailed directly to the patient. RECOMMENDATION: Screening mammogram in one year. (Code:SM-B-01Y) BI-RADS CATEGORY  1:  Negative. Electronically Signed   By: Margarette Canada M.D.   On: 01/02/2018 16:06     Assessment & Plan:   Naiyana was seen today for back pain and hip pain.  Diagnoses and all orders for this visit:  Sciatica of left side  Chronic pain syndrome  Facet arthritis of lumbar region -     LUMBAR FACET(MEDIAL BRANCH NERVE BLOCK) MBNB; Future  DDD (degenerative disc disease), lumbar  Lumbosacral spondylosis without myelopathy -     LUMBAR FACET(MEDIAL BRANCH NERVE BLOCK) MBNB; Future        ----------------------------------------------------------------------------------------------------------------------  Problem List Items Addressed This Visit    None    Visit Diagnoses    Sciatica of left side    -  Primary   Relevant Medications   diphenhydramine-acetaminophen (TYLENOL PM EXTRA STRENGTH) 25-500 MG TABS tablet   Chronic pain syndrome       Facet arthritis of lumbar region       Relevant Orders   LUMBAR FACET(MEDIAL BRANCH NERVE BLOCK) MBNB   DDD (degenerative disc disease), lumbar       Lumbosacral spondylosis without myelopathy       Relevant Orders   LUMBAR FACET(MEDIAL BRANCH NERVE BLOCK) MBNB        ----------------------------------------------------------------------------------------------------------------------  1. Sciatica of left side I want her to continue with her stretching strengthening exercises as previously reviewed.  I have discussed options regarding neurosurgical evaluation with  possible intervention.  She refuses this.  2. Chronic pain syndrome Continue with current physical therapy exercises and non-opioid pain medications as reviewed  3. Facet arthritis of lumbar region We will schedule her for a return to clinic in 4 weeks for a lumbar facet therapeutic block.  I have talked her about options for possible radiofrequency.  She has most notable L4-5 severe facet degeneration left side on her most recent MRI. - LUMBAR FACET(MEDIAL BRANCH NERVE BLOCK) MBNB; Future  4. DDD (degenerative disc disease), lumbar As above  5. Lumbosacral spondylosis without myelopathy As above - LUMBAR FACET(MEDIAL BRANCH NERVE BLOCK) MBNB; Future    ----------------------------------------------------------------------------------------------------------------------  I am having Elfredia Nevins maintain her amLODipine, aspirin EC, atenolol, chlorthalidone, losartan, multivitamin, nitroGLYCERIN, omeprazole, rosuvastatin, zolpidem, losartan, and diphenhydramine-acetaminophen.   No orders of the defined types were placed in this encounter.  Patient's Medications  New Prescriptions   No medications on file  Previous Medications   AMLODIPINE (NORVASC)  5 MG TABLET    Take by mouth daily.    ASPIRIN EC 81 MG TABLET    Take by mouth daily.    ATENOLOL (TENORMIN) 50 MG TABLET    Take by mouth 2 (two) times daily.    CHLORTHALIDONE (HYGROTON) 25 MG TABLET    TAKE 1/2 TABLET EVERY DAY   DIPHENHYDRAMINE-ACETAMINOPHEN (TYLENOL PM EXTRA STRENGTH) 25-500 MG TABS TABLET    Take by mouth.   LOSARTAN (COZAAR) 100 MG TABLET    Take 100 mg by mouth daily.    LOSARTAN (COZAAR) 100 MG TABLET    Take by mouth.   MULTIPLE VITAMIN (MULTIVITAMIN) CAPSULE    Take by mouth daily.    NITROGLYCERIN (NITROSTAT) 0.4 MG SL TABLET    Place under the tongue.   OMEPRAZOLE (PRILOSEC) 20 MG CAPSULE    Take by mouth daily.    ROSUVASTATIN (CRESTOR) 20 MG TABLET    Take by mouth daily.    ZOLPIDEM (AMBIEN) 10 MG  TABLET    Take by mouth at bedtime as needed.   Modified Medications   No medications on file  Discontinued Medications   No medications on file   ----------------------------------------------------------------------------------------------------------------------  Follow-up: Return in about 1 month (around 02/08/2018) for evaluation, procedure.    Molli Barrows, MD

## 2018-01-10 ENCOUNTER — Ambulatory Visit: Payer: Medicare Other | Admitting: Physical Therapy

## 2018-01-15 ENCOUNTER — Ambulatory Visit: Payer: Medicare Other | Attending: Student | Admitting: Physical Therapy

## 2018-01-15 ENCOUNTER — Encounter: Payer: Self-pay | Admitting: Physical Therapy

## 2018-01-15 DIAGNOSIS — G8929 Other chronic pain: Secondary | ICD-10-CM | POA: Diagnosis present

## 2018-01-15 DIAGNOSIS — M5442 Lumbago with sciatica, left side: Secondary | ICD-10-CM | POA: Diagnosis present

## 2018-01-15 NOTE — Therapy (Signed)
[facility] [address], [place], [address] Phone: [phone]   Fax:  [phone]  Physical Therapy Treatment  Patient Details  Name: Cassidy Hernandez MRN: [id number] Date of Birth: 1940-12-10 Referring Provider: Marin Olp   Encounter Date: 01/15/2018  PT End of Session - 01/15/18 1506    Visit Number  7    Number of Visits  17    Date for PT Re-Evaluation  02/01/18    PT Start Time  0230    PT Stop Time  0315    PT Time Calculation (min)  45 min    Activity Tolerance  Patient tolerated treatment well    Behavior During Therapy  [facility] for tasks assessed/performed       Past Medical History:  Diagnosis Date  . 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 Degenerative arthritis   . GERD (gastroesophageal reflux disease)   . H/O: hysterectomy   . Hypertension   . Kidney stone   . Subclavian steal syndrome     Past Surgical History:  Procedure Laterality Date  . ABDOMINAL HYSTERECTOMY    . AUGMENTATION MAMMAPLASTY Bilateral 1980  . 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 CHOLECYSTECTOMY    . COLONOSCOPY N/A 02/11/2016   Procedure: COLONOSCOPY;  Surgeon: Manya Silvas, MD;  Location: [facility] ENDOSCOPY;  Service: Endoscopy;  Laterality: N/A;    There were no vitals filed for this visit.  Subjective Assessment - 01/15/18 1439    Subjective  Patient reports she is having 8/10 pain in the low back and L hip today. Patient does report that her pain is not radiating down to the L foot, but that it is radiating into the L hip/buttock.     Pertinent History  Patient is a 77 year old female with LBP that she reports radiates down the L hip and reports the radiating pain is "electrical" burning and shooting. 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 Patient reports pain has been going on for 20 years, but has been  exacerbated over the past 3-4 months after lifting a sofa. Patient reports she is scheduled for an MRI Monday for the back pain. Patient reports deficits in prolonged sitting and standing. Patient reports her worst pain in the past week 10/10 best: 0/10 at rest. Pt denies N/V, unexplained weight fluctuation, saddle paresthesia, fever, night sweats, or unrelenting night pain at this time.    Limitations  Lifting;Standing;Walking;House hold activities    How long can you sit comfortably?  Reports she can only sit in a soft chair for 45 min before pain    How long can you stand comfortably?  10-4min    How long can you walk comfortably?  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 with subarticular stenosis on the right; Moderate spinal stenosis L4-5 with severe subarticular foraminal stenosis on the left; Xray 01/17/17: Grade 1 anterolisthesis @ L4-5, disc space narrowing in L3-5, and facet arthropathy in lower lumbar spine    Patient Stated Goals  Decrease pain, walk for exercise without pain, travel without pain    Pain Onset  More than a month ago          Manual -STM w/trigger point releaseat L piriformis and glute (prolonged time spent here as this is chief compliant) with 75% tension relief following -STM w/trigger point releaseatbilat  lumbar paraspinals L>Rwith 75% tension relief following following prolonged techniques - L4-S3 CPA grade I-II 30sec bouts 4 bouts each segment for pain modulation - L4-S3 R UPA grade III-IV 30sec bouts 4 bouts each segment to increase L lumbar rotation -Manual glute stretch 3x 30sec hold -Manual hamstring stretch 3x 30sec hold  Ther-Ex -Hooklying lower lumbar rotation x20 for increased lumbar rotation following manual techniques -Posterior pelvic tilt 3x 10 with max TC and VC with demonstration needed initially for proper form with core activation. Patient able to demonstrate posterior pelvic tilt in sitting as well following  exercise with patient educating patient to attempt to find neutral pelvic alignment with sitting/standing  Heat pack at the end of session 45min unbilled                      PT Education - 01/15/18 1504    Education provided  Yes    Education Details  Exercise form    Person(s) Educated  Patient    Methods  Explanation;Tactile cues;Verbal cues;Demonstration    Comprehension  Verbalized understanding;Returned demonstration;Verbal cues required;Tactile cues required       PT Short Term Goals - 12/07/17 1510      PT SHORT TERM GOAL #1   Title  Pt will be independent with HEP in order to improve strength and balance in order to decrease fall risk and improve function at home     Time  2    Period  Weeks    Status  New        PT Long Term Goals - 12/07/17 1510      PT LONG TERM GOAL #1   Title   Pt will increase 6MWT by at least 22m (122ft) in order to demonstrate clinically significant improvement in cardiopulmonary endurance and community ambulation    Baseline  12/07/17: 1153ft with L radiating hip pain following    Time  8    Period  Weeks    Status  New      PT LONG TERM GOAL #2   Title  Pt will decrease worst pain as reported on NPRS by at least 3 points in order to demonstrate clinically significant reduction in pain.    Baseline  12/07/17: worst pain 10/10 with walking    Time  8    Period  Weeks    Status  New      PT LONG TERM GOAL #3   Title  Pt will decrease 5TSTS by at least 3 seconds in order to demonstrate clinically significant improvement in LE strength    Baseline  12/07/17: 15sec    Time  8    Period  Weeks    Status  New      PT LONG TERM GOAL #4   Title  Patient will increase FOTO score to 55 to demonstrate predicted increase in functional mobility to complete ADLs    Baseline  12/07/17 41    Time  8    Period  Weeks    Status  New            Plan - 01/15/18 1535    Clinical Impression Statement  Pt with increased pain today,  but more centralized than at previous sessions. Following session patient reports no pain with walking, which she is happy about. PT continued to encourage patient to wear more supportive shoes, but patient reports that she "likes her pretty shoes",, but patient compromised to wear more supportive shows around  the house. PT educated and encouraged patient to sit and stand with core active, which patient reported she would try to focus on over the next weekend.     Rehab Potential  Good    Clinical Impairments Affecting Rehab Potential  (+) motivation, social support (-) age, chronicity of pain, fairly sedentary lifestyle    PT Frequency  2x / week    PT Treatment/Interventions  Aquatic Therapy;Moist Heat;Functional mobility training;Dry needling;Manual techniques;Neuromuscular re-education;Passive range of motion;Traction;Ultrasound;Iontophoresis 4mg /ml Dexamethasone;ADLs/Self Care Home Management;Cryotherapy;Electrical Stimulation;Therapeutic exercise;Therapeutic activities;Patient/family education;Taping    PT Next Visit Plan  Assess pain response to ceasing wearing heels, Pain management (manual and modalities), glute/hip extensor and core strengthening as tolerated    PT Home Exercise Plan  5/14: repeated flexion; eval: seated sciatic nerve glide, seated glute stretch,  standing hip flexor stretch, heat application 59-45 min 3x/day    Consulted and Agree with Plan of Care  Patient       Patient will benefit from skilled therapeutic intervention in order to improve the following deficits and impairments:  Increased fascial restricitons, Pain, Improper body mechanics, Impaired tone, Postural dysfunction, Decreased mobility, Decreased endurance, Decreased range of motion, Decreased strength, Decreased activity tolerance, Difficulty walking, Impaired flexibility  Visit Diagnosis: Chronic left-sided low back pain with left-sided sciatica     Problem List There are no active problems to display for  this patient.  Shelton Silvas PT, DPT Shelton Silvas 01/15/2018, 3:55 PM  [facility] [address], [place], [address] Phone: [phone]   Fax:  [phone]  Name: Cassidy Hernandez MRN: [id number] Date of Birth: 05-29-1941

## 2018-01-17 ENCOUNTER — Ambulatory Visit: Payer: Medicare Other | Admitting: Physical Therapy

## 2018-01-17 ENCOUNTER — Encounter: Payer: Self-pay | Admitting: Physical Therapy

## 2018-01-17 DIAGNOSIS — G8929 Other chronic pain: Secondary | ICD-10-CM

## 2018-01-17 DIAGNOSIS — M5442 Lumbago with sciatica, left side: Secondary | ICD-10-CM | POA: Diagnosis not present

## 2018-01-17 NOTE — Therapy (Signed)
[facility] [address] [place], [address] Phone: [phone]   Fax:  [phone]  Physical Therapy Treatment  Patient Details  Name: Cassidy Hernandez MRN: [id number] Date of Birth: 11-14-1940 Referring Provider: Marin Olp   Encounter Date: 01/17/2018  PT End of Session - 01/17/18 1400    Visit Number  8    Number of Visits  17    Date for PT Re-Evaluation  02/01/18    PT Start Time  0145    PT Stop Time  0230    PT Time Calculation (min)  45 min    Activity Tolerance  Patient tolerated treatment well    Behavior During Therapy  [facility] for tasks assessed/performed       Past Medical History:  Diagnosis Date  . 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 Degenerative arthritis   . GERD (gastroesophageal reflux disease)   . H/O: hysterectomy   . Hypertension   . Kidney stone   . Subclavian steal syndrome     Past Surgical History:  Procedure Laterality Date  . ABDOMINAL HYSTERECTOMY    . AUGMENTATION MAMMAPLASTY Bilateral 1980  . 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 CHOLECYSTECTOMY    . COLONOSCOPY N/A 02/11/2016   Procedure: COLONOSCOPY;  Surgeon: Manya Silvas, MD;  Location: [facility] ENDOSCOPY;  Service: Endoscopy;  Laterality: N/A;    There were no vitals filed for this visit.  Subjective Assessment - 01/17/18 1353    Subjective  Patient reports her pain has been decreased following last session, and is only having 2/10 pain today. Patient reports she is feeling better after each session.     Pertinent History  Patient is a 77 year old female with LBP that she reports radiates down the L hip and reports the radiating pain is "electrical" burning and shooting. 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 Patient reports pain has been going on for 20 years, but has been exacerbated over the past 3-4 months  after lifting a sofa. Patient reports she is scheduled for an MRI Monday for the back pain. Patient reports deficits in prolonged sitting and standing. Patient reports her worst pain in the past week 10/10 best: 0/10 at rest. Pt denies N/V, unexplained weight fluctuation, saddle paresthesia, fever, night sweats, or unrelenting night pain at this time.    Limitations  Lifting;Standing;Walking;House hold activities    How long can you sit comfortably?  Reports she can only sit in a soft chair for 45 min before pain    How long can you stand comfortably?  10-69min    How long can you walk comfortably?  35min    Diagnostic tests  MRI 12/11/17; Moderate spinal stenosis L3-4 with subarticular stenosis on the right; Moderate spinal stenosis L4-5 with severe subarticular foraminal stenosis on the left; Xray 01/17/17: Grade 1 anterolisthesis @ L4-5, disc space narrowing in L3-5, and facet arthropathy in lower lumbar spine    Patient Stated Goals  Decrease pain, walk for exercise without pain, travel without pain    Pain Onset  More than a month ago              Manual -STM w/trigger point releaseat L piriformis and glute (prolonged time spent here as this is chief compliant) with 75% tension relief following -STM w/trigger point releaseatbilat lumbar paraspinals L>Rwith 75% tension relief following following  prolonged techniques -L4-S3 CPA grade I-II 30sec bouts 4 bouts each segment for pain modulation - L4-S3 R UPA grade III-IV 30sec bouts 4 bouts each segment to increase L lumbar rotation   Ther-Ex -Hooklying lower lumbar rotation 3x 12 with oblique contraction to return to neutral; with max cuing needed initially for proper oblique contraction with TC/VC to draw "hip bone to rib cage" -Posterior pelvic tilt 3x 10 with 75% carry over from previous session, requiring only min TC and VC with demonstration needed initially for proper form with core activation.  -TA marches 3x 5/6/6each with max  cuing initially to maintain posterior pelvic tilt throughout leg lifts -Seated glute stretch 2x 30sec hold -Seated hamstring stretch 3x 30sec hold                     PT Education - 01/17/18 1407    Education provided  Yes    Education Details  Exercise form    Person(s) Educated  Patient    Methods  Explanation;Demonstration;Tactile cues;Verbal cues    Comprehension  Returned demonstration;Verbalized understanding;Verbal cues required;Tactile cues required       PT Short Term Goals - 12/07/17 1510      PT SHORT TERM GOAL #1   Title  Pt will be independent with HEP in order to improve strength and balance in order to decrease fall risk and improve function at home     Time  2    Period  Weeks    Status  New        PT Long Term Goals - 12/07/17 1510      PT LONG TERM GOAL #1   Title   Pt will increase 6MWT by at least 61m (132ft) in order to demonstrate clinically significant improvement in cardiopulmonary endurance and community ambulation    Baseline  12/07/17: 1185ft with L radiating hip pain following    Time  8    Period  Weeks    Status  New      PT LONG TERM GOAL #2   Title  Pt will decrease worst pain as reported on NPRS by at least 3 points in order to demonstrate clinically significant reduction in pain.    Baseline  12/07/17: worst pain 10/10 with walking    Time  8    Period  Weeks    Status  New      PT LONG TERM GOAL #3   Title  Pt will decrease 5TSTS by at least 3 seconds in order to demonstrate clinically significant improvement in LE strength    Baseline  12/07/17: 15sec    Time  8    Period  Weeks    Status  New      PT LONG TERM GOAL #4   Title  Patient will increase FOTO score to 55 to demonstrate predicted increase in functional mobility to complete ADLs    Baseline  12/07/17 41    Time  8    Period  Weeks    Status  New            Plan - 01/17/18 1415    Clinical Impression Statement  Pt is continuing to report decreased  pain allowing PT to progress therex with min cuing needed without pain. Patient demonstrated good carry over of proper core contraction allowing PT ot further challenge core stability with LE movement. Patient is progressing well, PT will continue to progress core strengthening as able, and utilizing manual techniques  to control pain.     Rehab Potential  Good    Clinical Impairments Affecting Rehab Potential  (+) motivation, social support (-) age, chronicity of pain, fairly sedentary lifestyle    PT Frequency  2x / week    PT Duration  8 weeks    PT Treatment/Interventions  Aquatic Therapy;Moist Heat;Functional mobility training;Dry needling;Manual techniques;Neuromuscular re-education;Passive range of motion;Traction;Ultrasound;Iontophoresis 4mg /ml Dexamethasone;ADLs/Self Care Home Management;Cryotherapy;Electrical Stimulation;Therapeutic exercise;Therapeutic activities;Patient/family education;Taping    PT Next Visit Plan  Assess pain response to ceasing wearing heels, Pain management (manual and modalities), glute/hip extensor and core strengthening as tolerated    PT Home Exercise Plan  5/14: repeated flexion; eval: seated sciatic nerve glide, seated glute stretch,  standing hip flexor stretch, heat application 79-02 min 3x/day    Consulted and Agree with Plan of Care  Patient       Patient will benefit from skilled therapeutic intervention in order to improve the following deficits and impairments:  Increased fascial restricitons, Pain, Improper body mechanics, Impaired tone, Postural dysfunction, Decreased mobility, Decreased endurance, Decreased range of motion, Decreased strength, Decreased activity tolerance, Difficulty walking, Impaired flexibility  Visit Diagnosis: Chronic left-sided low back pain with left-sided sciatica     Problem List There are no active problems to display for this patient.  Shelton Silvas PT, DPT Shelton Silvas 01/17/2018, 2:29 PM  [facility] [address], [place], [address] Phone: [phone]   Fax:  [phone]  Name: Cassidy Hernandez MRN: [id number] Date of Birth: 1941-02-01

## 2018-01-22 ENCOUNTER — Ambulatory Visit: Payer: Medicare Other | Admitting: Physical Therapy

## 2018-01-22 ENCOUNTER — Encounter: Payer: Self-pay | Admitting: Physical Therapy

## 2018-01-22 DIAGNOSIS — M5442 Lumbago with sciatica, left side: Secondary | ICD-10-CM | POA: Diagnosis not present

## 2018-01-22 DIAGNOSIS — G8929 Other chronic pain: Secondary | ICD-10-CM

## 2018-01-22 NOTE — Therapy (Signed)
[facility] [address], [place], [address] Phone: [phone]   Fax:  [phone]  Physical Therapy Treatment  Patient Details  Name: Cassidy Hernandez MRN: [id number] Date of Birth: [birth year] Referring Provider: Marin Olp   Encounter Date: 01/22/2018  PT End of Session - 01/22/18 1400    Visit Number  9    Number of Visits  17    Date for PT Re-Evaluation  02/01/18    PT Start Time  0148    PT Stop Time  0230    PT Time Calculation (min)  42 min    Activity Tolerance  Patient tolerated treatment well    Behavior During Therapy  [facility] for tasks assessed/performed       Past Medical History:  Diagnosis Date  . 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 Degenerative arthritis   . GERD (gastroesophageal reflux disease)   . H/O: hysterectomy   . Hypertension   . Kidney stone   . Subclavian steal syndrome     Past Surgical History:  Procedure Laterality Date  . ABDOMINAL HYSTERECTOMY    . AUGMENTATION MAMMAPLASTY Bilateral 1980  . 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 CHOLECYSTECTOMY    . COLONOSCOPY N/A 02/11/2016   Procedure: COLONOSCOPY;  Surgeon: Manya Silvas, MD;  Location: [facility] ENDOSCOPY;  Service: Endoscopy;  Laterality: N/A;    There were no vitals filed for this visit.  Subjective Assessment - 01/22/18 1358    Subjective  Patient reports her pain is continuing to decrease. Patient reports her pain is only radiating down to the R posterior knee, as opposed to down to the foot, which she is pleased with. Patinet reports compliance with HEP.     Pertinent History  Patient is a 77 year old female with LBP that she reports radiates down the L hip and reports the radiating pain is "electrical" burning and shooting. 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 Patient reports pain has been going on for 20  years, but has been exacerbated over the past 3-4 months after lifting a sofa. Patient reports she is scheduled for an MRI Monday for the back pain. Patient reports deficits in prolonged sitting and standing. Patient reports her worst pain in the past week 10/10 best: 0/10 at rest. Pt denies N/V, unexplained weight fluctuation, saddle paresthesia, fever, night sweats, or unrelenting night pain at this time.    Limitations  Lifting;Standing;Walking;House hold activities    How long can you sit comfortably?  Reports she can only sit in a soft chair for 45 min before pain    How long can you stand comfortably?  10-75min    How long can you walk comfortably?  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    Patient Stated Goals  Decrease pain, walk for exercise without pain, travel without pain    Pain Onset  More than a month ago           Manual -STM w/trigger point releaseat L piriformis and glute (prolonged time spent here as this is chief compliant) with 75% tension relief following -STM w/trigger point  releaseatbilat lumbar paraspinals L>Rwith 75% tension relief followingfollowing prolonged techniques -L4-S3 CPA grade I-II 30sec bouts 4 bouts each segment for pain modulation    Ther-Ex -Hooklying lower lumbar rotation 3x 5 each  with oblique contraction to return to neutral; with manual resistance from PT  -Posterior pelvic tilt 1x 10 with good carry over from previous session,  -TA marches 3x 6/7/7each with max cuing initially to maintain posterior pelvic tilt throughout leg lifts -Bridge 3x 8 with min cuing for maintained core activation and eccentric control  -Seated glute stretch 2x 30sec hold -Seated  hamstring stretch 2x 30sec hold                      PT Education - 01/22/18 1412    Education provided  Yes    Education Details  Exercise form    Person(s) Educated  Patient    Methods  Explanation;Demonstration;Tactile cues    Comprehension  Returned demonstration;Verbalized understanding;Tactile cues required       PT Short Term Goals - 12/07/17 1510      PT SHORT TERM GOAL #1   Title  Pt will be independent with HEP in order to improve strength and balance in order to decrease fall risk and improve function at home     Time  2    Period  Weeks    Status  New        PT Long Term Goals - 12/07/17 1510      PT LONG TERM GOAL #1   Title   Pt will increase 6MWT by at least 87m (171ft) in order to demonstrate clinically significant improvement in cardiopulmonary endurance and community ambulation    Baseline  12/07/17: 1158ft with L radiating hip pain following    Time  8    Period  Weeks    Status  New      PT LONG TERM GOAL #2   Title  Pt will decrease worst pain as reported on NPRS by at least 3 points in order to demonstrate clinically significant reduction in pain.    Baseline  12/07/17: worst pain 10/10 with walking    Time  8    Period  Weeks    Status  New      PT LONG TERM GOAL #3   Title  Pt will decrease 5TSTS by at least 3 seconds in order to demonstrate clinically significant improvement in LE strength    Baseline  12/07/17: 15sec    Time  8    Period  Weeks    Status  New      PT LONG TERM GOAL #4   Title  Patient will increase FOTO score to 55 to demonstrate predicted increase in functional mobility to complete ADLs    Baseline  12/07/17 41    Time  8    Period  Weeks    Status  New            Plan - 01/22/18 1423    Clinical Impression Statement  Pt is continuing to report decreased pain, and a better ability to manage pain on her own. PT lead patient through therex progression utilizing bridge exercise to combine glute and core  activation, which patient was able to comoplete with minimal cuing from PT. Patient is demonst of core activationbetween session, requiring less cuing for proper activation. Following session patient reports no pain. rating good carry-ove    Rehab Potential  Good    Clinical  Impairments Affecting Rehab Potential  (+) motivation, social support (-) age, chronicity of pain, fairly sedentary lifestyle    PT Treatment/Interventions  Aquatic Therapy;Moist Heat;Functional mobility training;Dry needling;Manual techniques;Neuromuscular re-education;Passive range of motion;Traction;Ultrasound;Iontophoresis 4mg /ml Dexamethasone;ADLs/Self Care Home Management;Cryotherapy;Electrical Stimulation;Therapeutic exercise;Therapeutic activities;Patient/family education;Taping    PT Next Visit Plan  Assess pain response to ceasing wearing heels, Pain management (manual and modalities), glute/hip extensor and core strengthening as tolerated    PT Home Exercise Plan  5/14: repeated flexion; eval: seated sciatic nerve glide, seated glute stretch,  standing hip flexor stretch, heat application 62-69 min 3x/day    Consulted and Agree with Plan of Care  Patient       Patient will benefit from skilled therapeutic intervention in order to improve the following deficits and impairments:  Increased fascial restricitons, Pain, Improper body mechanics, Impaired tone, Postural dysfunction, Decreased mobility, Decreased endurance, Decreased range of motion, Decreased strength, Decreased activity tolerance, Difficulty walking, Impaired flexibility  Visit Diagnosis: Chronic left-sided low back pain with left-sided sciatica     Problem List There are no active problems to display for this patient.  Shelton Silvas PT, DPT Shelton Silvas 01/22/2018, 2:31 PM  [facility] [address] [place], [address] Phone: [phone]   Fax:  [phone]  Name: Cassidy Hernandez MRN: [id number] Date of Birth: 09-25-1940

## 2018-01-25 ENCOUNTER — Ambulatory Visit: Payer: Medicare Other | Admitting: Physical Therapy

## 2018-01-25 ENCOUNTER — Encounter: Payer: Self-pay | Admitting: Physical Therapy

## 2018-01-25 DIAGNOSIS — M5442 Lumbago with sciatica, left side: Secondary | ICD-10-CM | POA: Diagnosis not present

## 2018-01-25 DIAGNOSIS — G8929 Other chronic pain: Secondary | ICD-10-CM

## 2018-01-25 NOTE — Therapy (Signed)
[facility] [address] [place], [address] Phone: [phone]   Fax:  [phone]  Physical Therapy Treatment  Patient Details  Name: TANNIS BURSTEIN MRN: [id number] Date of Birth: 1940/12/20 Referring Provider: Marin Olp   Encounter Date: 01/25/2018  PT End of Session - 01/25/18 1414    Visit Number  10    Number of Visits  17    Date for PT Re-Evaluation  02/01/18    PT Start Time  0145    PT Stop Time  0230    PT Time Calculation (min)  45 min    Activity Tolerance  Patient tolerated treatment well    Behavior During Therapy  [facility] for tasks assessed/performed       Past Medical History:  Diagnosis Date  . 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 Degenerative arthritis   . GERD (gastroesophageal reflux disease)   . H/O: hysterectomy   . Hypertension   . Kidney stone   . Subclavian steal syndrome     Past Surgical History:  Procedure Laterality Date  . ABDOMINAL HYSTERECTOMY    . AUGMENTATION MAMMAPLASTY Bilateral 1980  . 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 CHOLECYSTECTOMY    . COLONOSCOPY N/A 02/11/2016   Procedure: COLONOSCOPY;  Surgeon: Manya Silvas, MD;  Location: [facility] ENDOSCOPY;  Service: Endoscopy;  Laterality: N/A;    There were no vitals filed for this visit.  Subjective Assessment - 01/25/18 1351    Subjective  Patient reports pain is 2/10 pain today. She reports she is having little to no pain at rest but that she is continuing to have some pain with walking, reports pain comes from LB and radiates down posterior leg but not past knee. Patient reports she is encouraged by this. Patient reports compliance with HEP with no questions or concerns.     Pertinent History  Patient is a 77 year old female with LBP that she reports radiates down the L hip and reports the radiating pain is "electrical" burning and shooting. 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 Patient reports pain has been going on for 20 years, but has been exacerbated over the past 3-4 months after lifting a sofa. Patient reports she is scheduled for an MRI Monday for the back pain. Patient reports deficits in prolonged sitting and standing. Patient reports her worst pain in the past week 10/10 best: 0/10 at rest. Pt denies N/V, unexplained weight fluctuation, saddle paresthesia, fever, night sweats, or unrelenting night pain at this time.    Limitations  Lifting;Standing;Walking;House hold activities    How long can you sit comfortably?  Reports she can only sit in a soft chair for 45 min before pain    How long can you stand comfortably?  10-64min    How long can you walk comfortably?  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    Patient Stated Goals  Decrease pain, walk for exercise without pain, travel without pain    Pain Onset  More than a month ago          Manual -STM w/trigger point releaseat  L piriformis and glute (prolonged time spent here as this is chief compliant) with 75% tension relief following -STM w/trigger point releaseatbilat lumbar paraspinals L>Rwith 75% tension relief followingfollowing prolonged techniques -L4-S3 CPA grade I-II 30sec bouts 4 bouts each segment for pain modulation - Supine manual glute stretch 3x 45sec holds   Ther-Ex -Bridge exercise 3x 10 with min cuing for full glute contraction with full hip ext -Posterior pelvic tilt x10 with 3 sec holds -TA marches 3x 6/7/8 each with cuing to decrease pelvic rotation -Hooklying abduction 3x 10 with cuing for eccentric control and symmetrical LE abd   -Seated glute stretch 2x 45sec hold with opposite rotation cuing to intensify stretch                       PT Education - 01/25/18 1420    Education provided  Yes    Education Details  Exercise form    Person(s) Educated  Patient    Methods  Explanation;Demonstration;Tactile cues;Verbal cues    Comprehension  Verbal cues required;Returned demonstration;Verbalized understanding;Tactile cues required       PT Short Term Goals - 12/07/17 1510      PT SHORT TERM GOAL #1   Title  Pt will be independent with HEP in order to improve strength and balance in order to decrease fall risk and improve function at home     Time  2    Period  Weeks    Status  New        PT Long Term Goals - 12/07/17 1510      PT LONG TERM GOAL #1   Title   Pt will increase 6MWT by at least 24m (151ft) in order to demonstrate clinically significant improvement in cardiopulmonary endurance and community ambulation    Baseline  12/07/17: 1136ft with L radiating hip pain following    Time  8    Period  Weeks    Status  New      PT LONG TERM GOAL #2   Title  Pt will decrease worst pain as reported on NPRS by at least 3 points in order to demonstrate clinically significant reduction in pain.    Baseline  12/07/17: worst pain 10/10 with walking    Time  8    Period  Weeks    Status  New      PT LONG TERM GOAL #3   Title  Pt will decrease 5TSTS by at least 3 seconds in order to demonstrate clinically significant improvement in LE strength    Baseline  12/07/17: 15sec    Time  8    Period  Weeks    Status  New      PT LONG TERM GOAL #4   Title  Patient will increase FOTO score to 55 to demonstrate predicted increase in functional mobility to complete ADLs    Baseline  12/07/17 41    Time  8    Period  Weeks    Status  New            Plan - 01/25/18 1509    Clinical Impression Statement  Pt is continuing to report decreased pain, with lingering pain with walking in LBP and R hip. PT  led patient through progressive therex today, which patient was able to tolerate with no increased pain, requiring cuing for proper form/muscle activation. Patient and PT are pleased with progress so far and will continue working toward hip and core stabiilization to  decrease LBP.     Clinical Impairments Affecting Rehab Potential  (+) motivation, social support (-) age, chronicity of pain, fairly sedentary lifestyle    PT Frequency  2x / week    PT Duration  8 weeks    PT Treatment/Interventions  Aquatic Therapy;Moist Heat;Functional mobility training;Dry needling;Manual techniques;Neuromuscular re-education;Passive range of motion;Traction;Ultrasound;Iontophoresis 4mg /ml Dexamethasone;ADLs/Self Care Home Management;Cryotherapy;Electrical Stimulation;Therapeutic exercise;Therapeutic activities;Patient/family education;Taping    PT Next Visit Plan  Assess pain response to ceasing wearing heels, Pain management (manual and modalities), glute/hip extensor and core strengthening as tolerated    PT Home Exercise Plan  5/14: repeated flexion; eval: seated sciatic nerve glide, seated glute stretch,  standing hip flexor stretch, heat application 32-54 min 3x/day    Consulted and Agree with Plan of Care  Patient       Patient will benefit from skilled therapeutic intervention in order to improve the following deficits and impairments:  Increased fascial restricitons, Pain, Improper body mechanics, Impaired tone, Postural dysfunction, Decreased mobility, Decreased endurance, Decreased range of motion, Decreased strength, Decreased activity tolerance, Difficulty walking, Impaired flexibility  Visit Diagnosis: Chronic left-sided low back pain with left-sided sciatica     Problem List There are no active problems to display for this patient.  Shelton Silvas PT, DPT Shelton Silvas 01/25/2018, 3:16 PM  [facility] [address] [place], [address] Phone: [phone]   Fax:  [phone]  Name: ALYZA ARTIAGA MRN: [id number] Date of Birth: [birth year]

## 2018-01-30 ENCOUNTER — Ambulatory Visit: Payer: Medicare Other | Admitting: Physical Therapy

## 2018-01-30 ENCOUNTER — Encounter: Payer: Self-pay | Admitting: Physical Therapy

## 2018-01-30 DIAGNOSIS — E1122 Type 2 diabetes mellitus with diabetic chronic kidney disease: Secondary | ICD-10-CM | POA: Insufficient documentation

## 2018-01-30 DIAGNOSIS — G8929 Other chronic pain: Secondary | ICD-10-CM

## 2018-01-30 DIAGNOSIS — M5442 Lumbago with sciatica, left side: Principal | ICD-10-CM

## 2018-01-30 NOTE — Therapy (Signed)
[facility] [address], [place], [address] Phone: [phone]   Fax:  [phone]  Physical Therapy Treatment  Patient Details  Name: Cassidy Hernandez MRN: [id number] Date of Birth: [birth year] Referring Provider: Marin Olp   Encounter Date: 01/30/2018  PT End of Session - 01/30/18 1527    Visit Number  11    Number of Visits  17    Date for PT Re-Evaluation  02/01/18    PT Start Time  0315    PT Stop Time  0400    PT Time Calculation (min)  45 min    Activity Tolerance  Patient tolerated treatment well    Behavior During Therapy  [facility] for tasks assessed/performed       Past Medical History:  Diagnosis Date  . 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 Degenerative arthritis   . GERD (gastroesophageal reflux disease)   . H/O: hysterectomy   . Hypertension   . Kidney stone   . Subclavian steal syndrome     Past Surgical History:  Procedure Laterality Date  . ABDOMINAL HYSTERECTOMY    . AUGMENTATION MAMMAPLASTY Bilateral 1980  . 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 CHOLECYSTECTOMY    . COLONOSCOPY N/A 02/11/2016   Procedure: COLONOSCOPY;  Surgeon: Manya Silvas, MD;  Location: [facility] ENDOSCOPY;  Service: Endoscopy;  Laterality: N/A;    There were no vitals filed for this visit.  Ther-Ex Subjective Assessment - 01/30/18 1524    Subjective  Patient reports no pain today. Patient reports she thinks she is able to walk further before pain comes on. Patient reports no pain radiating down LEs. Patient reports compliance with her HEP.     Pertinent History  Patient is a 77 year old female with LBP that she reports radiates down the L hip and reports the radiating pain is "electrical" burning and shooting. 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 Patient reports pain has been going on for 20 years, but has  been exacerbated over the past 3-4 months after lifting a sofa. Patient reports she is scheduled for an MRI Monday for the back pain. Patient reports deficits in prolonged sitting and standing. Patient reports her worst pain in the past week 10/10 best: 0/10 at rest. Pt denies N/V, unexplained weight fluctuation, saddle paresthesia, fever, night sweats, or unrelenting night pain at this time.    Limitations  Lifting;Standing;Walking;House hold activities    How long can you sit comfortably?  Reports she can only sit in a soft chair for 45 min before pain    How long can you stand comfortably?  10-73min    How long can you walk comfortably?  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    Patient Stated Goals  Decrease pain, walk for exercise without pain, travel without pain    Pain Onset  More than a month ago     -Led patient through 6MWT where patient is able to ambulate with noramlized gait pattern with bilat hip/LBP noted after 5 min of ambulation patient reported 4/10 which is quickly resolved after 10x repeated flexion -5x STS 2  trials with patient able to stand w/o use of UE in 11 sec and 10sec  -SLR 2x 12 each with min cuing for proper ROM -Sidelying hip abd 2x 12 each side with min cuing to prevent hip rotation -Bridge review -Print out given of bridge, SLR, and  sidelying abd with education on continuing these exercises at rep range of 26-83 in order to elicit endurance gains, for patient to be able to walk further distances. Education on the difference of power strength needed for STS vs. Endurance strength needed for prolonged aerobic activity (ie walking)                          PT Education -  01/30/18 1527    Education provided  Yes    Education Details  endurance aimed therex approach    Person(s) Educated  Patient    Methods  Explanation;Demonstration;Tactile cues;Verbal cues    Comprehension  Verbal cues required;Returned demonstration;Verbalized understanding       PT Short Term Goals - 01/30/18 1528      PT SHORT TERM GOAL #1   Title  Pt will be independent with HEP in order to improve strength and balance in order to decrease fall risk and improve function at home     Time  2    Period  Weeks    Status  Achieved        PT Long Term Goals - 01/30/18 1531      PT LONG TERM GOAL #1   Title   Pt will increase 6MWT by at least 82m (140ft) in order to demonstrate clinically significant improvement in cardiopulmonary endurance and community ambulation    Baseline  01/30/18 1278ft with 4/10 pain in LBP/bilat hips without radiation     Time  8    Period  Weeks    Status  On-going      PT LONG TERM GOAL #2   Title  Pt will decrease worst pain as reported on NPRS by at least 79 points in order to demonstrate clinically significant reduction in pain.    Baseline  01/30/18 worst pain 3/10    Period  Weeks    Status  Achieved      PT LONG TERM GOAL #3   Title  Pt will decrease 5TSTS by at least 3 seconds in order to demonstrate clinically significant improvement in LE strength    Baseline  01/30/18 10.5 seconds    Time  8    Period  Weeks    Status  Achieved      PT LONG TERM GOAL #4   Title  Patient will increase FOTO score to 55 to demonstrate predicted increase in functional mobility to complete ADLs    Baseline  01/30/18 55    Time  8    Period  Weeks    Status  Achieved            Plan - 01/30/18 1628    Clinical Impression Statement  Patient was led through reassessment today, where she is demonstrating improvement in decreased pain, increased LE strngth, with improvement with lingering deficits in endurance in walking without pain. PT educated patient on  these findings, adding endurance therex to HEP, educating patient on rep/set range needed to make muscular endurance improvements. Patient will continue to benefit from skilled PT to address remaining impairments to return to PLOF and for pain management as needed.     Rehab Potential  Good    Clinical Impairments Affecting Rehab Potential  (+) motivation, social support (-) age, chronicity of pain, fairly sedentary lifestyle    PT Frequency  2x / week    PT Duration  8 weeks    PT Treatment/Interventions  Aquatic Therapy;Moist Heat;Functional mobility training;Dry needling;Manual techniques;Neuromuscular re-education;Passive range of motion;Traction;Ultrasound;Iontophoresis 4mg /ml Dexamethasone;ADLs/Self Care Home Management;Cryotherapy;Electrical Stimulation;Therapeutic exercise;Therapeutic activities;Patient/family education;Taping    PT Next Visit Plan  , Pain management (manual and modalities), LE strenghtening (hip focus) at endurance rep/set range and core strengthening as tolerated    PT Home Exercise Plan  01/30/18: SLR, sidelying hip abd, bridge 5/14: repeated flexion; eval: seated sciatic nerve glide, seated glute stretch,  standing hip flexor stretch, heat application 53-66 min 3x/day    Consulted and Agree with Plan of Care  Patient       Patient will benefit from skilled therapeutic intervention in order to improve the following deficits and impairments:  Increased fascial restricitons, Pain, Improper body mechanics, Impaired tone, Postural dysfunction, Decreased mobility, Decreased endurance, Decreased range of motion, Decreased strength, Decreased activity tolerance, Difficulty walking, Impaired flexibility  Visit Diagnosis: Chronic left-sided low back pain with left-sided sciatica     Problem List There are no active problems to display for this patient.   Shelton Silvas 01/30/2018, 4:31 PM   [facility] [address], [place], [address] Phone: [phone]   Fax:  [phone]  Name: LYNIAH FUJITA MRN: [id number] Date of Birth: [birth year]

## 2018-02-05 ENCOUNTER — Encounter: Payer: Self-pay | Admitting: Physical Therapy

## 2018-02-05 ENCOUNTER — Ambulatory Visit: Payer: Medicare Other | Attending: Student | Admitting: Physical Therapy

## 2018-02-05 DIAGNOSIS — M5442 Lumbago with sciatica, left side: Secondary | ICD-10-CM | POA: Insufficient documentation

## 2018-02-05 DIAGNOSIS — G8929 Other chronic pain: Secondary | ICD-10-CM

## 2018-02-05 NOTE — Therapy (Signed)
[facility] [address] [place], [address] Phone: [phone]   Fax:  [phone]  Physical Therapy Treatment  Patient Details  Name: Cassidy Hernandez MRN: [id number] Date of Birth: Oct 23, 1940 Referring Provider: Marin Olp   Encounter Date: 02/05/2018  PT End of Session - 02/05/18 1403    Visit Number  12    Number of Visits  29    Date for PT Re-Evaluation  03/13/18    PT Start Time  0145    PT Stop Time  0230    PT Time Calculation (min)  45 min    Activity Tolerance  Patient tolerated treatment well    Behavior During Therapy  [facility] for tasks assessed/performed       Past Medical History:  Diagnosis Date  . 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 Degenerative arthritis   . GERD (gastroesophageal reflux disease)   . H/O: hysterectomy   . Hypertension   . Kidney stone   . Subclavian steal syndrome     Past Surgical History:  Procedure Laterality Date  . ABDOMINAL HYSTERECTOMY    . AUGMENTATION MAMMAPLASTY Bilateral 1980  . 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 CHOLECYSTECTOMY    . COLONOSCOPY N/A 02/11/2016   Procedure: COLONOSCOPY;  Surgeon: Manya Silvas, MD;  Location: [facility] ENDOSCOPY;  Service: Endoscopy;  Laterality: N/A;    There were no vitals filed for this visit.  Subjective Assessment - 02/05/18 1353    Subjective  Patient reports she does not think her LBP is getting much better, but she is not having any pain down the LE (with some L buttock). Patient reports LBP pain is 2/10 now and over the weekend she had increased pain to 4-5/10 pain with prolonged walking. Reposrts diligence with her HEP with no questions/concerns.     Pertinent History  Patient is a 77 year old female with LBP that she reports radiates down the L hip and reports the radiating pain is "electrical" burning and shooting. 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 Patient reports pain has been going on for 20 years, but has been exacerbated over the past 3-4 months after lifting a sofa. Patient reports she is scheduled for an MRI Monday for the back pain. Patient reports deficits in prolonged sitting and standing. Patient reports her worst pain in the past week 10/10 best: 0/10 at rest. Pt denies N/V, unexplained weight fluctuation, saddle paresthesia, fever, night sweats, or unrelenting night pain at this time.    Limitations  Lifting;Standing;Walking;House hold activities    How long can you sit comfortably?  Reports she can only sit in a soft chair for 45 min before pain    How long can you stand comfortably?  10-49min    How long can you walk comfortably?  13min    Diagnostic tests  MRI 12/11/17; Moderate spinal stenosis L3-4 with subarticular stenosis on the right; Moderate spinal stenosis L4-5 with severe subarticular foraminal stenosis on the left; Xray 01/17/17: Grade 1 anterolisthesis @ L4-5, disc space narrowing in L3-5, and facet arthropathy in lower lumbar spine    Patient Stated Goals  Decrease pain, walk for exercise without pain, travel without pain    Pain Onset  More than a month ago            ESTIM + heat pack Hi[employer] ESTIM 15 min  at patient tolerated 160V increased to 185V through treatment at bilat lumbar paraspinals . Attempted to decrease soft tissue restrictions causing pain in this area. Educated patient on risks/benefitis, and purpose of modality use; pt consents to treatment. With PT assessing patient tolerance throughout (decreasing intensity as needed), monitoring skin integrity (normal), with decreased pain noted from patient following. PT utilized this time to reinforce concept of   Manual -L4-S3 CPA grade I-II 30sec bouts 4 bouts each segment for pain modulation - Supine manual glute stretch 3x 45sec holds - Supine manual 90/90 hamstring stretch 3x 45sec holds   Ther-Ex -TA  marches 3x 10 each with cuing to decrease pelvic rotation w/ LE movement -Hooklying lower lumbar rotations x20 -Mini squat 3x 10 from elevated mat table (lowering table each set) with cuing for proper start position and for glute activation. Education to adopt this squat form with ADLs                      PT Education - 02/05/18 1403    Education provided  Yes    Education Details  ESTIM education    Person(s) Educated  Patient    Methods  Explanation    Comprehension  Verbalized understanding       PT Short Term Goals - 01/30/18 1528      PT SHORT TERM GOAL #1   Title  Pt will be independent with HEP in order to improve strength and balance in order to decrease fall risk and improve function at home     Time  2    Period  Weeks    Status  Achieved        PT Long Term Goals - 01/30/18 1531      PT LONG TERM GOAL #1   Title   Pt will increase 6MWT by at least 72m (149ft) in order to demonstrate clinically significant improvement in cardiopulmonary endurance and community ambulation    Baseline  01/30/18 1265ft with 4/10 pain in LBP/bilat hips without radiation     Time  8    Period  Weeks    Status  On-going      PT LONG TERM GOAL #2   Title  Pt will decrease worst pain as reported on NPRS by at least 3 points in order to demonstrate clinically significant reduction in pain.    Baseline  01/30/18 worst pain 3/10    Period  Weeks    Status  Achieved      PT LONG TERM GOAL #3   Title  Pt will decrease 5TSTS by at least 3 seconds in order to demonstrate clinically significant improvement in LE strength    Baseline  01/30/18 10.5 seconds    Time  8    Period  Weeks    Status  Achieved      PT LONG TERM GOAL #4   Title  Patient will increase FOTO score to 55 to demonstrate predicted increase in functional mobility to complete ADLs    Baseline  01/30/18 55    Time  8    Period  Weeks    Status  Achieved            Plan - 02/05/18 1441    Clinical  Impression Statement  Patient is continuing to report pain in the LB following prolonged ambulation with tightness in bilat lumbar paraspinals. PT introduced modality treatment to attempt to further relieve muscle tension in this area, ensuring patient was  educated on the importance of utilizi ng her HEP for maintainence of gains made during session; as patient reports no pain following with ambulation.     Rehab Potential  Good    Clinical Impairments Affecting Rehab Potential  (+) motivation, social support (-) age, chronicity of pain, fairly sedentary lifestyle    PT Frequency  2x / week    PT Duration  8 weeks    PT Treatment/Interventions  Aquatic Therapy;Moist Heat;Functional mobility training;Dry needling;Manual techniques;Neuromuscular re-education;Passive range of motion;Traction;Ultrasound;Iontophoresis 4mg /ml Dexamethasone;ADLs/Self Care Home Management;Cryotherapy;Electrical Stimulation;Therapeutic exercise;Therapeutic activities;Patient/family education;Taping    PT Next Visit Plan  , Pain management (manual and modalities), LE strenghtening (hip focus) at endurance rep/set range and core strengthening as tolerated    PT Home Exercise Plan  01/30/18: SLR, sidelying hip abd, bridge 5/14: repeated flexion; eval: seated sciatic nerve glide, seated glute stretch,  standing hip flexor stretch, heat application 94-49 min 3x/day    Consulted and Agree with Plan of Care  Patient       Patient will benefit from skilled therapeutic intervention in order to improve the following deficits and impairments:  Increased fascial restricitons, Pain, Improper body mechanics, Impaired tone, Postural dysfunction, Decreased mobility, Decreased endurance, Decreased range of motion, Decreased strength, Decreased activity tolerance, Difficulty walking, Impaired flexibility  Visit Diagnosis: Chronic left-sided low back pain with left-sided sciatica     Problem List There are no active problems to display for  this patient.  Shelton Silvas PT, DPT Shelton Silvas 02/05/2018, 2:44 PM  [facility] [address] [place], [address] Phone: [phone]   Fax:  [phone]  Name: Cassidy Hernandez MRN: [id number] Date of Birth: Jun 25, 1941

## 2018-02-07 ENCOUNTER — Encounter: Payer: No Typology Code available for payment source | Admitting: Physical Therapy

## 2018-02-07 ENCOUNTER — Ambulatory Visit (HOSPITAL_BASED_OUTPATIENT_CLINIC_OR_DEPARTMENT_OTHER): Payer: Medicare Other | Admitting: Anesthesiology

## 2018-02-07 ENCOUNTER — Telehealth: Payer: Self-pay | Admitting: *Deleted

## 2018-02-07 ENCOUNTER — Ambulatory Visit
Admission: RE | Admit: 2018-02-07 | Discharge: 2018-02-07 | Disposition: A | Discharge status: Home / Self Care | Payer: Medicare Other | Source: Ambulatory Visit | Attending: Anesthesiology | Admitting: Anesthesiology

## 2018-02-07 ENCOUNTER — Other Ambulatory Visit: Payer: Self-pay

## 2018-02-07 ENCOUNTER — Other Ambulatory Visit: Payer: Self-pay | Admitting: Anesthesiology

## 2018-02-07 ENCOUNTER — Encounter: Payer: Self-pay | Admitting: Anesthesiology

## 2018-02-07 VITALS — BP 182/76 | HR 65 | Temp 97.8°F | Resp 16 | Ht 63.0 in | Wt 180.0 lb

## 2018-02-07 DIAGNOSIS — Z79899 Other long term (current) drug therapy: Secondary | ICD-10-CM | POA: Insufficient documentation

## 2018-02-07 DIAGNOSIS — M47817 Spondylosis without myelopathy or radiculopathy, lumbosacral region: Secondary | ICD-10-CM | POA: Insufficient documentation

## 2018-02-07 DIAGNOSIS — G894 Chronic pain syndrome: Secondary | ICD-10-CM | POA: Insufficient documentation

## 2018-02-07 DIAGNOSIS — M47816 Spondylosis without myelopathy or radiculopathy, lumbar region: Secondary | ICD-10-CM

## 2018-02-07 DIAGNOSIS — M4686 Other specified inflammatory spondylopathies, lumbar region: Secondary | ICD-10-CM | POA: Diagnosis not present

## 2018-02-07 DIAGNOSIS — M5432 Sciatica, left side: Secondary | ICD-10-CM | POA: Diagnosis not present

## 2018-02-07 DIAGNOSIS — M5442 Lumbago with sciatica, left side: Secondary | ICD-10-CM | POA: Insufficient documentation

## 2018-02-07 DIAGNOSIS — M4696 Unspecified inflammatory spondylopathy, lumbar region: Secondary | ICD-10-CM

## 2018-02-07 DIAGNOSIS — R52 Pain, unspecified: Secondary | ICD-10-CM | POA: Diagnosis present

## 2018-02-07 DIAGNOSIS — Z7982 Long term (current) use of aspirin: Secondary | ICD-10-CM | POA: Diagnosis not present

## 2018-02-07 MED ORDER — TRIAMCINOLONE ACETONIDE 40 MG/ML IJ SUSP
40.0000 mg | Freq: Once | INTRAMUSCULAR | Status: AC
  Administered 2018-02-07: 40 mg
  Filled 2018-02-07: qty 1

## 2018-02-07 MED ORDER — LIDOCAINE HCL (PF) 1 % IJ SOLN
INTRAMUSCULAR | Status: AC
  Filled 2018-02-07: qty 10

## 2018-02-07 MED ORDER — ROPIVACAINE HCL 2 MG/ML IJ SOLN
10.0000 mL | Freq: Once | INTRAMUSCULAR | Status: AC
  Administered 2018-02-07: 10 mL via EPIDURAL
  Filled 2018-02-07: qty 10

## 2018-02-07 MED ORDER — SODIUM CHLORIDE 0.9% FLUSH: 10.0000 mL | Freq: Once | INTRAVENOUS | Status: DC

## 2018-02-07 MED ORDER — LIDOCAINE HCL (PF) 1 % IJ SOLN
10.0000 mL | Freq: Once | INTRAMUSCULAR | Status: AC
  Administered 2018-02-07: 10 mL via SUBCUTANEOUS

## 2018-02-07 NOTE — Patient Instructions (Signed)
Pain Management Discharge Instructions  General Discharge Instructions :  If you need to reach your doctor call: Monday-Friday 8:00 am - 4:00 pm at [phone] or toll free [phone].  After clinic hours [phone] to have operator reach doctor.  Bring all of your medication bottles to all your appointments in the pain clinic.  To cancel or reschedule your appointment with Pain Management please remember to call 24 hours in advance to avoid a fee.  Refer to the educational materials which you have been given on: General Risks, I had my Procedure. Discharge Instructions, Post Sedation.  Post Procedure Instructions:  The drugs you were given will stay in your system until tomorrow, so for the next 24 hours you should not drive, make any legal decisions or drink any alcoholic beverages.  You may eat anything you prefer, but it is better to start with liquids then soups and crackers, and gradually work up to solid foods.  Please notify your doctor immediately if you have any unusual bleeding, trouble breathing or pain that is not related to your normal pain.  Depending on the type of procedure that was done, some parts of your body may feel week and/or numb.  This usually clears up by tonight or the next day.  Walk with the use of an assistive device or accompanied by an adult for the 24 hours.  You may use ice on the affected area for the first 24 hours.  Put ice in a Ziploc bag and cover with a towel and place against area 15 minutes on 15 minutes off.  You may switch to heat after 24 hours.Facet Joint Block The facet joints connect the bones of the spine (vertebrae). They make it possible for you to bend, twist, and make other movements with your spine. They also keep you from bending too far, twisting too far, and making other excessive movements. A facet joint block is a procedure where a numbing medicine (anesthetic) is injected into a facet joint. Often, a type of  anti-inflammatory medicine called a steroid is also injected. A facet joint block may be done to diagnose neck or back pain. If the pain gets better after a facet joint block, it means the pain is probably coming from the facet joint. If the pain does not get better, it means the pain is probably not coming from the facet joint. A facet joint block may also be done to relieve neck or back pain caused by an inflamed facet joint. A facet joint block is only done to relieve pain if the pain does not improve with other methods, such as medicine, exercise programs, and physical therapy. Tell a health care provider about:  Any allergies you have.  All medicines you are taking, including vitamins, herbs, eye drops, creams, and over-the-counter medicines.  Any problems you or family members have had with anesthetic medicines.  Any blood disorders you have.  Any surgeries you have had.  Any medical conditions you have.  Whether you are pregnant or may be pregnant. What are the risks? Generally, this is a safe procedure. However, problems may occur, including:  Bleeding.  Injury to a nerve near the injection site.  Pain at the injection site.  Weakness or numbness in areas controlled by nerves near the injection site.  Infection.  Temporary fluid retention.  Allergic reactions to medicines or dyes.  Injury to other structures or organs near the injection site.  What happens before the procedure?  Follow instructions from your health   care provider about eating or drinking restrictions.  Ask your health care provider about: ? Changing or stopping your regular medicines. This is especially important if you are taking diabetes medicines or blood thinners. ? Taking medicines such as aspirin and ibuprofen. These medicines can thin your blood. Do not take these medicines before your procedure if your health care provider instructs you not to.  Do not take any new dietary supplements or  medicines without asking your health care provider first.  Plan to have someone take you home after the procedure. What happens during the procedure?  You may need to remove your clothing and dress in an open-back gown.  The procedure will be done while you are lying on an X-ray table. You will most likely be asked to lie on your stomach, but you may be asked to lie in a different position if an injection will be made in your neck.  Machines will be used to monitor your oxygen levels, heart rate, and blood pressure.  If an injection will be made in your neck, an IV tube will be inserted into one of your veins. Fluids and medicine will flow directly into your body through the IV tube.  The area over the facet joint where the injection will be made will be cleaned with soap. The surrounding skin will be covered with clean drapes.  A numbing medicine (local anesthetic) will be applied to your skin. Your skin may sting or burn for a moment.  A video X-ray machine (fluoroscopy) will be used to locate the joint. In some cases, a CT scan may be used.  A contrast dye may be injected into the facet joint area to help locate the joint.  When the joint is located, an anesthetic will be injected into the joint through the needle.  Your health care provider will ask you whether you feel pain relief. If you do feel relief, a steroid may be injected to provide pain relief for a longer period of time. If you do not feel relief or feel only partial relief, additional injections of an anesthetic may be made in other facet joints.  The needle will be removed.  Your skin will be cleaned.  A bandage (dressing) will be applied over each injection site. The procedure may vary among health care providers and hospitals. What happens after the procedure?  You will be observed for 15-30 minutes before being allowed to go home. This information is not intended to replace advice given to you by your health care  provider. Make sure you discuss any questions you have with your health care provider. Document Released: 12/14/2006 Document Revised: 08/26/2015 Document Reviewed: 04/20/2015 Elsevier Interactive Patient Education  2018 [employer]  

## 2018-02-07 NOTE — Progress Notes (Signed)
Subjective:  Patient ID: Cassidy Hernandez, female    DOB: 1940-11-10  Age: 77 y.o. MRN: [id number]  CC: Back Pain (lower)   Procedure: Left side L3-4 L4-5 L5-S1 intra-articular facet injection under fluoroscopic guidance without sedation  HPI Cassidy Hernandez presents for reevaluation.  She was last seen approximately month ago and presents today for a repeat injection.  In the distant past she had some facet injections which gave her good relief of her low back pain.  She has severe left lower back pain with radiation into the hip and buttocks on the left side.  This is worse with standing and certain rotational and strain activities for the low back.  She rates this as a severe pain.  She has been able to avoid opioid medication.  She has been through physical therapy and more conservative therapy with anti-inflammatories without significant improvement.  She also had a selective nerve root block at L4 which was ineffective and actually worsened some of her low back pain.  Otherwise she is in her usual state of health and no new changes in bowel or bladder function at this time.  Outpatient Medications Prior to Visit  Medication Sig Dispense Refill  . amLODipine (NORVASC) 5 MG tablet Take by mouth daily.     [name] aspirin EC 81 MG tablet Take by mouth daily.     [name] atenolol (TENORMIN) 50 MG tablet Take by mouth 2 (two) times daily.     . chlorthalidone (HYGROTON) 25 MG tablet TAKE 1/2 TABLET EVERY DAY    . diphenhydramine-acetaminophen (TYLENOL PM EXTRA STRENGTH) 25-500 MG TABS tablet Take by mouth.    . losartan (COZAAR) 100 MG tablet Take 100 mg by mouth daily.     [name] losartan (COZAAR) 100 MG tablet Take by mouth.    . Multiple Vitamin (MULTIVITAMIN) capsule Take by mouth daily.     . nitroGLYCERIN (NITROSTAT) 0.4 MG SL tablet Place under the tongue.    [name] omeprazole (PRILOSEC) 20 MG capsule Take by mouth daily.     . rosuvastatin (CRESTOR) 20 MG tablet Take by mouth daily.     [name] zolpidem (AMBIEN)  10 MG tablet Take by mouth at bedtime as needed.      No facility-administered medications prior to visit.     Review of Systems CNS: No confusion or sedation Cardiac: No angina or palpitations GI: No abdominal pain or constipation Constitutional: No nausea vomiting fevers or chills  Objective:  BP (!) 182/76   Pulse 65   Temp 97.8 F (36.6 C)   Resp 16   Ht 5\' 3"  (1.6 m)   Wt 180 lb (81.6 kg)   SpO2 96%   BMI 31.89 kg/m    BP Readings from Last 3 Encounters:  02/07/18 (!) 182/76  01/09/18 (!) 183/66  10/09/17 (!) 200/81     Wt Readings from Last 3 Encounters:  02/07/18 180 lb (81.6 kg)  01/09/18 180 lb (81.6 kg)  10/09/17 180 lb (81.6 kg)     Physical Exam Pt is alert and oriented PERRL EOMI HEART IS RRR no murmur or rub LCTA no wheezing or rales MUSCULOSKELETAL reveals some paraspinous muscle tenderness but no overt trigger points.  She has pain with standing and especially left lateral rotation and loading of the left lumbar facets.  This does reproduce pain into the left hip and buttock region.  Muscle tone and bulk is at baseline.  Labs  No results found for: HGBA1C Lab Results  Component Value Date   CREATININE 1.09 04/19/2014    -------------------------------------------------------------------------------------------------------------------- Lab Results  Component Value Date   WBC 13.2 (H) 04/19/2014   HGB 15.3 04/19/2014   HCT 46.4 04/19/2014   PLT 289 04/19/2014   GLUCOSE 131 (H) 04/19/2014   ALT 71 (H) 04/19/2014   AST 26 04/19/2014   NA 136 04/19/2014   K 3.5 04/19/2014   CL 102 04/19/2014   CREATININE 1.09 04/19/2014   BUN 21 (H) 04/19/2014   CO2 26 04/19/2014    --------------------------------------------------------------------------------------------------------------------- No results found.   Assessment & Plan:   Cassidy Hernandez was seen today for back pain.  Diagnoses and all orders for this visit:  Sciatica of left  side  Chronic pain syndrome -     LUMBAR FACET(MEDIAL BRANCH NERVE BLOCK) MBNB; Future  Facet arthritis of lumbar region -     LUMBAR FACET(MEDIAL BRANCH NERVE BLOCK) MBNB -     LUMBAR FACET(MEDIAL BRANCH NERVE BLOCK) MBNB; Future  Lumbosacral spondylosis without myelopathy -     LUMBAR FACET(MEDIAL BRANCH NERVE BLOCK) MBNB -     LUMBAR FACET(MEDIAL BRANCH NERVE BLOCK) MBNB; Future  Other orders -     triamcinolone acetonide (KENALOG-40) injection 40 mg -     sodium chloride flush (NS) 0.9 % injection 10 mL -     ropivacaine (PF) 2 mg/mL (0.2%) (NAROPIN) injection 10 mL -     lidocaine (PF) (XYLOCAINE) 1 % injection 10 mL        ----------------------------------------------------------------------------------------------------------------------  Problem List Items Addressed This Visit    None    Visit Diagnoses    Sciatica of left side    -  Primary   Chronic pain syndrome       Relevant Orders   LUMBAR FACET(MEDIAL BRANCH NERVE BLOCK) MBNB   Facet arthritis of lumbar region       Relevant Medications   triamcinolone acetonide (KENALOG-40) injection 40 mg (Completed)   Other Relevant Orders   LUMBAR FACET(MEDIAL BRANCH NERVE BLOCK) MBNB   Lumbosacral spondylosis without myelopathy       Relevant Medications   triamcinolone acetonide (KENALOG-40) injection 40 mg (Completed)   Other Relevant Orders   LUMBAR FACET(MEDIAL BRANCH NERVE BLOCK) MBNB        ----------------------------------------------------------------------------------------------------------------------  1. Sciatica of left side Continue with core stretching strengthening exercises.  2. Chronic pain syndrome As above - LUMBAR FACET(MEDIAL BRANCH NERVE BLOCK) MBNB; Future  3. Facet arthritis of lumbar region We will proceed with a intra-articular lumbar facet injection as reviewed with her today.  I am going to do this for therapeutic purpose at L3-4 L4-5 and L5-S1. - LUMBAR FACET(MEDIAL BRANCH  NERVE BLOCK) MBNB - LUMBAR FACET(MEDIAL BRANCH NERVE BLOCK) MBNB; Future  4. Lumbosacral spondylosis without myelopathy We have gone over the risks and benefits of the above procedure with her in full detail and all questions were answered.  She is to return to clinic in 1 month for possible repeat injection if indicated at that time.  Continue with core stretching strengthening exercises. - LUMBAR FACET(MEDIAL BRANCH NERVE BLOCK) MBNB - LUMBAR FACET(MEDIAL BRANCH NERVE BLOCK) MBNB; Future    ----------------------------------------------------------------------------------------------------------------------  I am having Cassidy Hernandez maintain her amLODipine, aspirin EC, atenolol, chlorthalidone, losartan, multivitamin, nitroGLYCERIN, omeprazole, rosuvastatin, zolpidem, losartan, and diphenhydramine-acetaminophen. We administered triamcinolone acetonide, ropivacaine (PF) 2 mg/mL (0.2%), and lidocaine (PF).   Meds ordered this encounter  Medications  . triamcinolone acetonide (KENALOG-40) injection 40 mg  . sodium chloride flush (NS) 0.9 %  injection 10 mL  . ropivacaine (PF) 2 mg/mL (0.2%) (NAROPIN) injection 10 mL  . lidocaine (PF) (XYLOCAINE) 1 % injection 10 mL   Patient's Medications  New Prescriptions   No medications on file  Previous Medications   AMLODIPINE (NORVASC) 5 MG TABLET    Take by mouth daily.    ASPIRIN EC 81 MG TABLET    Take by mouth daily.    ATENOLOL (TENORMIN) 50 MG TABLET    Take by mouth 2 (two) times daily.    CHLORTHALIDONE (HYGROTON) 25 MG TABLET    TAKE 1/2 TABLET EVERY DAY   DIPHENHYDRAMINE-ACETAMINOPHEN (TYLENOL PM EXTRA STRENGTH) 25-500 MG TABS TABLET    Take by mouth.   LOSARTAN (COZAAR) 100 MG TABLET    Take 100 mg by mouth daily.    LOSARTAN (COZAAR) 100 MG TABLET    Take by mouth.   MULTIPLE VITAMIN (MULTIVITAMIN) CAPSULE    Take by mouth daily.    NITROGLYCERIN (NITROSTAT) 0.4 MG SL TABLET    Place under the tongue.   OMEPRAZOLE (PRILOSEC) 20  MG CAPSULE    Take by mouth daily.    ROSUVASTATIN (CRESTOR) 20 MG TABLET    Take by mouth daily.    ZOLPIDEM (AMBIEN) 10 MG TABLET    Take by mouth at bedtime as needed.   Modified Medications   No medications on file  Discontinued Medications   No medications on file   ----------------------------------------------------------------------------------------------------------------------  Follow-up: Return for evaluation, procedure.  Procedure: Left side lumbar intra-articular facet injection under fluoroscopic guidance at L3-4 L4-5 L5-S1 with sedation Patient was taken to the fluoroscopy suite and placed in the prone position.Vital signs were stable throughout the procedure. The  overlying area of skin on the left side was prepped with Betadine 3 and strict aseptic technique was utilized throughout the procedure. Flouroscopy was used to  identify the areas overlying the aforementioned facets at  L3-4  L4-5 and  L5-S1. 1% lidocaine 1 cc was infiltrated subcutaneously and into the fascia with a 25-gauge needle at each of these sites. I then advanced a 22-gauge 3-1/2 inch Quinckie needle with the needle tip to lie intra-articular at the after mentioned levels.  There was negative aspiration for heme or CSF and  no paresthesia. I then injected 1.5 cc of ropivacaine 0.2% mixed with 10 mg of triamcinolone at each of the aforementioned sites. These needles were withdrawn.  The patient tolerated the procedure without any difficulty and was convalesced discharged home in stable condition for follow-up as mentioned  Molli Barrows, MD

## 2018-02-07 NOTE — Progress Notes (Signed)
Safety precautions to be maintained throughout the outpatient stay will include: orient to surroundings, keep bed in low position, maintain call bell within reach at all times, provide assistance with transfer out of bed and ambulation.  

## 2018-02-12 ENCOUNTER — Ambulatory Visit: Payer: Medicare Other | Admitting: Physical Therapy

## 2018-02-12 ENCOUNTER — Encounter: Payer: Self-pay | Admitting: Physical Therapy

## 2018-02-12 DIAGNOSIS — M5442 Lumbago with sciatica, left side: Principal | ICD-10-CM

## 2018-02-12 DIAGNOSIS — G8929 Other chronic pain: Secondary | ICD-10-CM

## 2018-02-12 NOTE — Therapy (Signed)
[facility] [address], [place], [address] Phone: [phone]   Fax:  [phone]  Physical Therapy Treatment  Patient Details  Name: Cassidy Hernandez MRN: [id number] Date of Birth: 1941/07/17 Referring Provider: Marin Olp   Encounter Date: 02/12/2018  PT End of Session - 02/12/18 1351    Visit Number  13    Number of Visits  29    Date for PT Re-Evaluation  03/13/18    PT Start Time  0145    PT Stop Time  0230    PT Time Calculation (min)  45 min    Activity Tolerance  Patient tolerated treatment well    Behavior During Therapy  [facility] for tasks assessed/performed       Past Medical History:  Diagnosis Date  . 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 Degenerative arthritis   . GERD (gastroesophageal reflux disease)   . H/O: hysterectomy   . Hypertension   . Kidney stone   . Subclavian steal syndrome     Past Surgical History:  Procedure Laterality Date  . ABDOMINAL HYSTERECTOMY    . AUGMENTATION MAMMAPLASTY Bilateral 1980  . 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 CHOLECYSTECTOMY    . COLONOSCOPY N/A 02/11/2016   Procedure: COLONOSCOPY;  Surgeon: Manya Silvas, MD;  Location: [facility] ENDOSCOPY;  Service: Endoscopy;  Laterality: N/A;    There were no vitals filed for this visit.  Subjective Assessment - 02/12/18 1348    Subjective  Patient reports she had "an injection Tuesday" that helped "a lot". Patient reports she has had minimal pain through the weekend and today. Patient reports she was able to walk uphill Saturday night at [facility] and was able to do so without pain, which she is happy with. Patient reports no sx down the LEs over the weekend. Patient reports compliance with HEP with no questions or concerns.     Pertinent History  Patient is a 77 year old female with LBP that she reports radiates down the L hip and reports the radiating pain is "electrical"  burning and shooting. 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 Patient reports pain has been going on for 20 years, but has been exacerbated over the past 3-4 months after lifting a sofa. Patient reports she is scheduled for an MRI Monday for the back pain. Patient reports deficits in prolonged sitting and standing. Patient reports her worst pain in the past week 10/10 best: 0/10 at rest. Pt denies N/V, unexplained weight fluctuation, saddle paresthesia, fever, night sweats, or unrelenting night pain at this time.    Limitations  Lifting;Standing;Walking;House hold activities    How long can you sit comfortably?  Reports she can only sit in a soft chair for 45 min before pain    How long can you stand comfortably?  10-33min    How long can you walk comfortably?  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 Advisement to cease resistance training for duration of trip as patient will be walking a lot. PT re-printed stretches only for patient to take on her trip: -Seated glute stretch 30sec hold each -Seated piriformis stretch 30sec hold each with demo given initially -Seated trunk rotation stretch 30sec hold each side with demo -Seated hamstring stretch 30sec hold each side -Hooklying lumbar rotation stretch 30sec  hold each side with TC needed initially -Mini squat 3x 10 from elevated mat table with good carry over from last session with cuing for foot placement and proper knee alignment. Patient demonstrating better core control with exercise this session -Education on proper posture with core activation to reduce pain throughout trip                      PT Education - 02/12/18 1432    Education provided  Yes    Education Details  Stretches for duration of vacation for pain management     Person(s) Educated  Patient    Methods  Explanation;Demonstration;Tactile cues;Verbal cues;Handout    Comprehension  Tactile cues required;Verbal cues required;Returned demonstration;Verbalized understanding       PT Short Term Goals - 01/30/18 1528      PT SHORT TERM GOAL #1   Title  Pt will be independent with HEP in order to improve strength and balance in order to decrease fall risk and improve function at home     Time  2    Period  Weeks    Status  Achieved        PT Long Term Goals - 01/30/18 1531      PT LONG TERM GOAL #1   Title   Pt will increase 6MWT by at least 30m (139ft) in order to demonstrate clinically significant improvement in cardiopulmonary endurance and community ambulation    Baseline  01/30/18 1226ft with 4/10 pain in LBP/bilat hips without radiation     Time  8    Period  Weeks    Status  On-going      PT LONG TERM GOAL #2   Title  Pt will decrease worst pain as reported on NPRS by at least 3 points in order to demonstrate clinically significant reduction in pain.    Baseline  01/30/18 worst pain 3/10    Period  Weeks    Status  Achieved      PT LONG TERM GOAL #3   Title  Pt will decrease 5TSTS by at least 3 seconds in order to demonstrate clinically significant improvement in LE strength    Baseline  01/30/18 10.5 seconds    Time  8    Period  Weeks    Status  Achieved      PT LONG TERM GOAL #4   Title  Patient will increase FOTO score to 55 to  demonstrate predicted increase in functional mobility to complete ADLs    Baseline  01/30/18 55    Time  8    Period  Weeks    Status  Achieved            Plan - 02/12/18 1548    Clinical Impression Statement  Patient reports that post-injection her pain has been greatly reduced. PT utilized session to educate patient on pain management  while on vacation, as patient will be gone on a cruise for a week. Patient was able to complete all stretching with proper form and verbalized understanding of postural education and imprtance of stretching as patient will be increasing phsical activity over the next week    Rehab Potential  Good    Clinical Impairments Affecting Rehab Potential  (+) motivation, social support (-) age, chronicity of pain, fairly sedentary lifestyle    PT Frequency  2x / week    PT Duration  8 weeks    PT Treatment/Interventions  Aquatic Therapy;Moist Heat;Functional mobility training;Dry needling;Manual techniques;Neuromuscular re-education;Passive range of motion;Traction;Ultrasound;Iontophoresis 4mg /ml Dexamethasone;ADLs/Self Care Home Management;Cryotherapy;Electrical Stimulation;Therapeutic exercise;Therapeutic activities;Patient/family education;Taping    PT Next Visit Plan  , Pain management (manual and modalities), LE strenghtening (hip focus) at endurance rep/set range and core strengthening as tolerated    PT Home Exercise Plan  01/30/18: SLR, sidelying hip abd, bridge 5/14: repeated flexion; eval: seated sciatic nerve glide, seated glute stretch,  standing hip flexor stretch, heat application 73-71 min 3x/day    Consulted and Agree with Plan of Care  Patient       Patient will benefit from skilled therapeutic intervention in order to improve the following deficits and impairments:  Increased fascial restricitons, Pain, Improper body mechanics, Impaired tone, Postural dysfunction, Decreased mobility, Decreased endurance, Decreased range of motion, Decreased strength,  Decreased activity tolerance, Difficulty walking, Impaired flexibility  Visit Diagnosis: Chronic left-sided low back pain with left-sided sciatica     Problem List There are no active problems to display for this patient.  Shelton Silvas PT, DPT Shelton Silvas 02/12/2018, 4:14 PM  [place] [facility] [address] [place], [address] Phone: [phone]   Fax:  [phone]  Name: Cassidy Hernandez MRN: [id number] Date of Birth: May 13, 1941

## 2018-02-14 ENCOUNTER — Encounter: Payer: No Typology Code available for payment source | Admitting: Physical Therapy

## 2018-02-19 ENCOUNTER — Encounter: Payer: No Typology Code available for payment source | Admitting: Physical Therapy

## 2018-02-21 ENCOUNTER — Encounter: Payer: No Typology Code available for payment source | Admitting: Physical Therapy

## 2018-02-27 ENCOUNTER — Ambulatory Visit: Payer: Medicare Other | Admitting: Physical Therapy

## 2018-03-01 ENCOUNTER — Encounter: Payer: Self-pay | Admitting: Physical Therapy

## 2018-03-01 ENCOUNTER — Ambulatory Visit: Payer: Medicare Other | Admitting: Physical Therapy

## 2018-03-01 DIAGNOSIS — M5442 Lumbago with sciatica, left side: Secondary | ICD-10-CM | POA: Diagnosis not present

## 2018-03-01 DIAGNOSIS — G8929 Other chronic pain: Secondary | ICD-10-CM

## 2018-03-01 NOTE — Therapy (Signed)
[place] [facility] [address] [place], [address] Phone: [phone]   Fax:  [phone]  Physical Therapy Treatment  Patient Details  Name: Cassidy Hernandez MRN: [id number] Date of Birth: Feb 08, 1941 Referring Provider: Marin Olp   Encounter Date: 03/01/2018  PT End of Session - 03/01/18 1121    Visit Number  14    Number of Visits  29    Date for PT Re-Evaluation  03/13/18    PT Start Time  1115    PT Stop Time  1200    PT Time Calculation (min)  45 min    Activity Tolerance  Patient tolerated treatment well    Behavior During Therapy  [facility] for tasks assessed/performed       Past Medical History:  Diagnosis Date  . 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 Degenerative arthritis   . GERD (gastroesophageal reflux disease)   . H/O: hysterectomy   . Hypertension   . Kidney stone   . Subclavian steal syndrome     Past Surgical History:  Procedure Laterality Date  . ABDOMINAL HYSTERECTOMY    . AUGMENTATION MAMMAPLASTY Bilateral 1980  . 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 CHOLECYSTECTOMY    . COLONOSCOPY N/A 02/11/2016   Procedure: COLONOSCOPY;  Surgeon: Manya Silvas, MD;  Location: [facility] ENDOSCOPY;  Service: Endoscopy;  Laterality: N/A;    There were no vitals filed for this visit.  Subjective Assessment - 03/01/18 1119    Subjective  Patient reports she does not think she needs to come back to PT after today, as she was able to walk on her cruise and all around the ports without pain. Patient reports no LBP since last visit. Patient reports some compliance with HEP with no questions or concerns.     Pertinent History  Patient is a 77 year old female with LBP that she reports radiates down the L hip and reports the radiating pain is "electrical" burning and shooting. 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  Patient reports pain has been going on for 20 years, but has been exacerbated over the past 3-4 months after lifting a sofa. Patient reports she is scheduled for an MRI Monday for the back pain. Patient reports deficits in prolonged sitting and standing. Patient reports her worst pain in the past week 10/10 best: 0/10 at rest. Pt denies N/V, unexplained weight fluctuation, saddle paresthesia, fever, night sweats, or unrelenting night pain at this time.    Limitations  Lifting;Standing;Walking;House hold activities    How long can you sit comfortably?  Reports she can only sit in a soft chair for 45 min before pain    How long can you stand comfortably?  10-89mn    How long can you walk comfortably?  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 with subarticular stenosis on the right; Moderate spinal stenosis L4-5 with severe subarticular foraminal stenosis on the left; Xray 01/17/17: Grade 1 anterolisthesis @ L4-5, disc space narrowing in L3-5, and facet arthropathy in lower lumbar spine    Patient Stated Goals  Decrease pain, walk for exercise without pain, travel without pain    Pain Onset  More than a month ago            Ther-Ex -6MWT with patient demonstrating increased LE endurance to ambulate without pain over 6  minutes totaling 1368f -Review of stretching with demonstration of each and advisement to continue daily stretching 3x 45-60sec holds of: seated glute stretch, seated hamstring stretch and standing hip flexor stretch -Review of LE strengthening with 1 set 10x demonstration of each to ensure proper form, and advisement to continue 2x/week at 3sets of 10 of: bridge exercise, standing hip abd, standing hip ext, STS, and heel raises -Review of core strengthening with 1 set 10x demonstration of each to ensure proper form, and advisement to continue 2x/week at 3sets of 10 of: posterior pelvic tilt, TA marches, and palloff press  - Education on continuing walking  program (patient reports she enjoys walking in the park or in the mall) 362m/day 5 days a week   Therapeutic Activity -Patient reports that she is worried about not being able to "get up if she falls down" . PT led patient through safely rising from floor (mat on ground). PT attempted to teach patient safe rising without support, but patient was unable to complete. PT utilized "crawling" on hands and knees to chair or other stable support. Patient is able to come to half kneeling and utilize unilateral UE support on thigh and other UE support on chair, and patient is able to safely stand this way. PT encouraged patient and gave resource info on continuing a silver sneakers course to aid in strengthening and balance to ensure she continues to be able to rise from the floor for fall safety.               PT Education - 03/01/18 1121    Education provided  Yes    Education Details  D/C reccommendations; exercise form    Person(s) Educated  Patient    Methods  Explanation;Tactile cues;Verbal cues    Comprehension  Verbal cues required;Verbalized understanding;Tactile cues required       PT Short Term Goals - 01/30/18 1528      PT SHORT TERM GOAL #1   Title  Pt will be independent with HEP in order to improve strength and balance in order to decrease fall risk and improve function at home     Time  2    Period  Weeks    Status  Achieved        PT Long Term Goals - 03/01/18 1122      PT LONG TERM GOAL #1   Title   Pt will increase 6MWT by at least [id number] order to demonstrate clinically significant improvement in cardiopulmonary endurance and community ambulation    Baseline  03/01/18 1320ft58fh no pain noted following    Time  8    Period  Weeks    Status  On-going      PT LONG TERM GOAL #2   Title  Pt will decrease worst pain as reported on NPRS by at least 3 points in order to demonstrate clinically significant reduction in pain.    Baseline  03/01/18 0/10 pain  in LB and bilat hips over past 2 weeks    Time  8    Period  Weeks    Status  Achieved      PT LONG TERM GOAL #3   Title  Pt will decrease 5TSTS by at least 3 seconds in order to demonstrate clinically significant improvement in LE strength    Baseline  01/30/18 10.5 seconds    Time  8    Period  Weeks    Status  Achieved  PT LONG TERM GOAL #4   Title  Patient will increase FOTO score to 55 to demonstrate predicted increase in functional mobility to complete ADLs    Baseline  01/30/18 55    Time  8    Period  Weeks    Status  Achieved            Plan - 03/01/18 1405    Clinical Impression Statement  Patient is met all goals at this time and is able to d/c to HEP for maintainence of PT gains and to continue healthy lifestyle modifications. Patient was able to demonstrate HEP exercises with 100% accuracy following PT cuing with handout provided and PT instruction on frequency/rep-set range/duration/intensity. Patient verbalized understanding of all education provided. Patient was also able to correctly demonstrate rise from floor for fall safety, with PT educating patient on importance of maintaining LE strength to continue to be able to rise from the floor. Pt provided PT clinic contact info should any new issues/concerns arise.    Clinical Presentation  Stable    Rehab Potential  Good    Clinical Impairments Affecting Rehab Potential  (+) motivation, social support (-) age, chronicity of pain, fairly sedentary lifestyle    PT Frequency  2x / week    PT Duration  8 weeks    PT Treatment/Interventions  Aquatic Therapy;Moist Heat;Functional mobility training;Dry needling;Manual techniques;Neuromuscular re-education;Passive range of motion;Traction;Ultrasound;Iontophoresis 5m/ml Dexamethasone;ADLs/Self Care Home Management;Cryotherapy;Electrical Stimulation;Therapeutic exercise;Therapeutic activities;Patient/family education;Taping    PT Next Visit Plan  , Pain management (manual and  modalities), LE strenghtening (hip focus) at endurance rep/set range and core strengthening as tolerated    PT Home Exercise Plan  01/30/18: SLR, sidelying hip abd, bridge 5/14: repeated flexion; eval: seated sciatic nerve glide, seated glute stretch,  standing hip flexor stretch, heat application 168-37min 3x/day    Consulted and Agree with Plan of Care  Patient       Patient will benefit from skilled therapeutic intervention in order to improve the following deficits and impairments:  Increased fascial restricitons, Pain, Improper body mechanics, Impaired tone, Postural dysfunction, Decreased mobility, Decreased endurance, Decreased range of motion, Decreased strength, Decreased activity tolerance, Difficulty walking, Impaired flexibility  Visit Diagnosis: Chronic left-sided low back pain with left-sided sciatica     Problem List There are no active problems to display for this patient.  CShelton SilvasPT, DPT CShelton Silvas7/25/2019, 2:16 PM  [facility] [address] [place] [address]Phone: [phone]  Fax:  [phone] Name: Cassidy TUCCIARONEMRN: [id number] of Birth: [birth year]

## 2018-03-05 ENCOUNTER — Ambulatory Visit: Payer: Medicare Other | Admitting: Physical Therapy

## 2018-03-07 ENCOUNTER — Encounter: Payer: No Typology Code available for payment source | Admitting: Physical Therapy

## 2018-03-27 ENCOUNTER — Ambulatory Visit
Admission: RE | Admit: 2018-03-27 | Discharge: 2018-03-27 | Disposition: A | Discharge status: Home / Self Care | Payer: Medicare Other | Source: Ambulatory Visit | Attending: Anesthesiology | Admitting: Anesthesiology

## 2018-03-27 ENCOUNTER — Other Ambulatory Visit: Payer: Self-pay

## 2018-03-27 ENCOUNTER — Other Ambulatory Visit: Payer: Self-pay | Admitting: Anesthesiology

## 2018-03-27 ENCOUNTER — Encounter: Payer: Self-pay | Admitting: Anesthesiology

## 2018-03-27 ENCOUNTER — Ambulatory Visit (HOSPITAL_BASED_OUTPATIENT_CLINIC_OR_DEPARTMENT_OTHER): Payer: Medicare Other | Admitting: Anesthesiology

## 2018-03-27 VITALS — BP 147/94 | HR 70 | Temp 98.0°F | Resp 16 | Ht 63.0 in | Wt 177.0 lb

## 2018-03-27 DIAGNOSIS — G894 Chronic pain syndrome: Secondary | ICD-10-CM | POA: Insufficient documentation

## 2018-03-27 DIAGNOSIS — M47816 Spondylosis without myelopathy or radiculopathy, lumbar region: Secondary | ICD-10-CM

## 2018-03-27 DIAGNOSIS — M47817 Spondylosis without myelopathy or radiculopathy, lumbosacral region: Secondary | ICD-10-CM

## 2018-03-27 DIAGNOSIS — Z79899 Other long term (current) drug therapy: Secondary | ICD-10-CM | POA: Insufficient documentation

## 2018-03-27 DIAGNOSIS — N920 Excessive and frequent menstruation with regular cycle: Secondary | ICD-10-CM | POA: Diagnosis not present

## 2018-03-27 DIAGNOSIS — M25551 Pain in right hip: Secondary | ICD-10-CM | POA: Insufficient documentation

## 2018-03-27 DIAGNOSIS — M5432 Sciatica, left side: Secondary | ICD-10-CM

## 2018-03-27 DIAGNOSIS — R52 Pain, unspecified: Secondary | ICD-10-CM

## 2018-03-27 DIAGNOSIS — M25552 Pain in left hip: Secondary | ICD-10-CM | POA: Diagnosis present

## 2018-03-27 MED ORDER — LIDOCAINE HCL (PF) 1 % IJ SOLN
5.0000 mL | Freq: Once | INTRAMUSCULAR | Status: AC
  Administered 2018-03-27: 5 mL via SUBCUTANEOUS

## 2018-03-27 MED ORDER — ROPIVACAINE HCL 2 MG/ML IJ SOLN
10.0000 mL | Freq: Once | INTRAMUSCULAR | Status: AC
  Administered 2018-03-27: 10 mL via EPIDURAL

## 2018-03-27 MED ORDER — TRIAMCINOLONE ACETONIDE 40 MG/ML IJ SUSP
INTRAMUSCULAR | Status: AC
  Filled 2018-03-27: qty 1

## 2018-03-27 MED ORDER — LIDOCAINE HCL (PF) 1 % IJ SOLN
INTRAMUSCULAR | Status: AC
  Filled 2018-03-27: qty 5

## 2018-03-27 MED ORDER — ROPIVACAINE HCL 2 MG/ML IJ SOLN
INTRAMUSCULAR | Status: AC
  Filled 2018-03-27: qty 10

## 2018-03-27 MED ORDER — SODIUM CHLORIDE 0.9% FLUSH: 10.0000 mL | Freq: Once | INTRAVENOUS | Status: DC

## 2018-03-27 MED ORDER — TRIAMCINOLONE ACETONIDE 40 MG/ML IJ SUSP
40.0000 mg | Freq: Once | INTRAMUSCULAR | Status: AC
  Administered 2018-03-27: 40 mg

## 2018-03-27 NOTE — Patient Instructions (Signed)
Pain Management Discharge Instructions  General Discharge Instructions :  If you need to reach your doctor call: Monday-Friday 8:00 am - 4:00 pm at [phone] or toll free [phone].  After clinic hours [phone] to have operator reach doctor.  Bring all of your medication bottles to all your appointments in the pain clinic.  To cancel or reschedule your appointment with Pain Management please remember to call 24 hours in advance to avoid a fee.  Refer to the educational materials which you have been given on: General Risks, I had my Procedure. Discharge Instructions, Post Sedation.  Post Procedure Instructions:  The drugs you were given will stay in your system until tomorrow, so for the next 24 hours you should not drive, make any legal decisions or drink any alcoholic beverages.  You may eat anything you prefer, but it is better to start with liquids then soups and crackers, and gradually work up to solid foods.  Please notify your doctor immediately if you have any unusual bleeding, trouble breathing or pain that is not related to your normal pain.  Depending on the type of procedure that was done, some parts of your body may feel week and/or numb.  This usually clears up by tonight or the next day.  Walk with the use of an assistive device or accompanied by an adult for the 24 hours.  You may use ice on the affected area for the first 24 hours.  Put ice in a Ziploc bag and cover with a towel and place against area 15 minutes on 15 minutes off.  You may switch to heat after 24 hours.Facet Joint Block The facet joints connect the bones of the spine (vertebrae). They make it possible for you to bend, twist, and make other movements with your spine. They also keep you from bending too far, twisting too far, and making other excessive movements. A facet joint block is a procedure where a numbing medicine (anesthetic) is injected into a facet joint. Often, a type of  anti-inflammatory medicine called a steroid is also injected. A facet joint block may be done to diagnose neck or back pain. If the pain gets better after a facet joint block, it means the pain is probably coming from the facet joint. If the pain does not get better, it means the pain is probably not coming from the facet joint. A facet joint block may also be done to relieve neck or back pain caused by an inflamed facet joint. A facet joint block is only done to relieve pain if the pain does not improve with other methods, such as medicine, exercise programs, and physical therapy. Tell a health care provider about:  Any allergies you have.  All medicines you are taking, including vitamins, herbs, eye drops, creams, and over-the-counter medicines.  Any problems you or family members have had with anesthetic medicines.  Any blood disorders you have.  Any surgeries you have had.  Any medical conditions you have.  Whether you are pregnant or may be pregnant. What are the risks? Generally, this is a safe procedure. However, problems may occur, including:  Bleeding.  Injury to a nerve near the injection site.  Pain at the injection site.  Weakness or numbness in areas controlled by nerves near the injection site.  Infection.  Temporary fluid retention.  Allergic reactions to medicines or dyes.  Injury to other structures or organs near the injection site.  What happens before the procedure?  Follow instructions from your health   care provider about eating or drinking restrictions.  Ask your health care provider about: ? Changing or stopping your regular medicines. This is especially important if you are taking diabetes medicines or blood thinners. ? Taking medicines such as aspirin and ibuprofen. These medicines can thin your blood. Do not take these medicines before your procedure if your health care provider instructs you not to.  Do not take any new dietary supplements or  medicines without asking your health care provider first.  Plan to have someone take you home after the procedure. What happens during the procedure?  You may need to remove your clothing and dress in an open-back gown.  The procedure will be done while you are lying on an X-ray table. You will most likely be asked to lie on your stomach, but you may be asked to lie in a different position if an injection will be made in your neck.  Machines will be used to monitor your oxygen levels, heart rate, and blood pressure.  If an injection will be made in your neck, an IV tube will be inserted into one of your veins. Fluids and medicine will flow directly into your body through the IV tube.  The area over the facet joint where the injection will be made will be cleaned with soap. The surrounding skin will be covered with clean drapes.  A numbing medicine (local anesthetic) will be applied to your skin. Your skin may sting or burn for a moment.  A video X-ray machine (fluoroscopy) will be used to locate the joint. In some cases, a CT scan may be used.  A contrast dye may be injected into the facet joint area to help locate the joint.  When the joint is located, an anesthetic will be injected into the joint through the needle.  Your health care provider will ask you whether you feel pain relief. If you do feel relief, a steroid may be injected to provide pain relief for a longer period of time. If you do not feel relief or feel only partial relief, additional injections of an anesthetic may be made in other facet joints.  The needle will be removed.  Your skin will be cleaned.  A bandage (dressing) will be applied over each injection site. The procedure may vary among health care providers and hospitals. What happens after the procedure?  You will be observed for 15-30 minutes before being allowed to go home. This information is not intended to replace advice given to you by your health care  provider. Make sure you discuss any questions you have with your health care provider. Document Released: 12/14/2006 Document Revised: 08/26/2015 Document Reviewed: 04/20/2015 Elsevier Interactive Patient Education  2018 [employer]  

## 2018-03-27 NOTE — Progress Notes (Signed)
Safety precautions to be maintained throughout the outpatient stay will include: orient to surroundings, keep bed in low position, maintain call bell within reach at all times, provide assistance with transfer out of bed and ambulation.  

## 2018-03-27 NOTE — Progress Notes (Signed)
Subjective:  Patient ID: Cassidy Hernandez, female    DOB: 12-10-1940  Age: 77 y.o. MRN: [id number]  CC: Hip Pain (bilateral)   Procedure: L2-3 L3-4 L4-5 L5-S1 medial branch block to the left side facets under fluoroscopic guidance with no sedation  HPI Cassidy Hernandez presents for menorrhagia.  She was last seen in July and had her first diagnostic left side lumbar facet block.  She reports a 75% improvement since her injection.  She was able to recently travel and be active with left pain.  She feels like she is made good progress and this is the most effective therapy that she has experienced.  She presents today with diminished left-sided low back pain and some occasional pain that radiates into the left posterior upper leg and buttocks region.  No bowel or bladder dysfunction is noted and her strength is good.  Otherwise she is in her usual state of health at this time.  Outpatient Medications Prior to Visit  Medication Sig Dispense Refill  . amLODipine (NORVASC) 5 MG tablet Take by mouth daily.     [name] aspirin EC 81 MG tablet Take by mouth daily.     [name] atenolol (TENORMIN) 50 MG tablet Take by mouth 2 (two) times daily.     . chlorthalidone (HYGROTON) 25 MG tablet TAKE 1/2 TABLET EVERY DAY    . diphenhydramine-acetaminophen (TYLENOL PM EXTRA STRENGTH) 25-500 MG TABS tablet Take by mouth.    . losartan (COZAAR) 100 MG tablet Take 100 mg by mouth daily.     [name] losartan (COZAAR) 100 MG tablet Take by mouth.    . Multiple Vitamin (MULTIVITAMIN) capsule Take by mouth daily.     . nitroGLYCERIN (NITROSTAT) 0.4 MG SL tablet Place under the tongue.    [name] omeprazole (PRILOSEC) 20 MG capsule Take by mouth daily.     . rosuvastatin (CRESTOR) 20 MG tablet Take by mouth daily.     [name] zolpidem (AMBIEN) 10 MG tablet Take by mouth at bedtime as needed.      No facility-administered medications prior to visit.     Review of Systems CNS: No confusion or sedation Cardiac: No angina or palpitations GI: No  abdominal pain or constipation Constitutional: No nausea vomiting fevers or chills  Objective:  BP (!) 147/94   Pulse 70   Temp 98 F (36.7 C)   Resp 16   Ht 5\' 3"  (1.6 m)   Wt 177 lb (80.3 kg)   SpO2 95%   BMI 31.35 kg/m    BP Readings from Last 3 Encounters:  03/27/18 (!) 147/94  02/07/18 (!) 182/76  01/09/18 (!) 183/66     Wt Readings from Last 3 Encounters:  03/27/18 177 lb (80.3 kg)  02/07/18 180 lb (81.6 kg)  01/09/18 180 lb (81.6 kg)     Physical Exam Pt is alert and oriented PERRL EOMI HEART IS RRR no murmur or rub LCTA no wheezing or rales MUSCULOSKELETAL reveals some tenderness in the left lumbar spine but no overt trigger points.  Her muscle tone and bulk is at baseline  Labs  No results found for: HGBA1C Lab Results  Component Value Date   CREATININE 1.09 04/19/2014    -------------------------------------------------------------------------------------------------------------------- Lab Results  Component Value Date   WBC 13.2 (H) 04/19/2014   HGB 15.3 04/19/2014   HCT 46.4 04/19/2014   PLT 289 04/19/2014   GLUCOSE 131 (H) 04/19/2014   ALT 71 (H) 04/19/2014   AST 26 04/19/2014   NA  136 04/19/2014   K 3.5 04/19/2014   CL 102 04/19/2014   CREATININE 1.09 04/19/2014   BUN 21 (H) 04/19/2014   CO2 26 04/19/2014    --------------------------------------------------------------------------------------------------------------------- Dg C-arm 1-60 Min-no Report  Result Date: 03/27/2018 Fluoroscopy was utilized by the requesting physician.  No radiographic interpretation.     Assessment & Plan:   Cassidy Hernandez was seen today for hip pain.  Diagnoses and all orders for this visit:  Sciatica of left side  Chronic pain syndrome -     LUMBAR FACET(MEDIAL BRANCH NERVE BLOCK) MBNB  Facet arthritis of lumbar region -     LUMBAR FACET(MEDIAL BRANCH NERVE BLOCK) MBNB -     LUMBAR FACET(MEDIAL BRANCH NERVE BLOCK) MBNB; Future  Lumbosacral  spondylosis without myelopathy -     LUMBAR FACET(MEDIAL BRANCH NERVE BLOCK) MBNB -     LUMBAR FACET(MEDIAL BRANCH NERVE BLOCK) MBNB; Future  Other orders -     triamcinolone acetonide (KENALOG-40) injection 40 mg -     sodium chloride flush (NS) 0.9 % injection 10 mL -     ropivacaine (PF) 2 mg/mL (0.2%) (NAROPIN) injection 10 mL -     lidocaine (PF) (XYLOCAINE) 1 % injection 5 mL        ----------------------------------------------------------------------------------------------------------------------  Problem List Items Addressed This Visit    None    Visit Diagnoses    Sciatica of left side    -  Primary   Chronic pain syndrome       Facet arthritis of lumbar region       Relevant Medications   triamcinolone acetonide (KENALOG-40) injection 40 mg (Completed)   Other Relevant Orders   LUMBAR FACET(MEDIAL BRANCH NERVE BLOCK) MBNB   Lumbosacral spondylosis without myelopathy       Relevant Medications   triamcinolone acetonide (KENALOG-40) injection 40 mg (Completed)   Other Relevant Orders   LUMBAR FACET(MEDIAL BRANCH NERVE BLOCK) MBNB        ----------------------------------------------------------------------------------------------------------------------  1. Sciatica of left side Continue with stretching strengthening exercises as previously reviewed  2. Chronic pain syndrome As above - LUMBAR FACET(MEDIAL BRANCH NERVE BLOCK) MBNB  3. Facet arthritis of lumbar region As above - LUMBAR FACET(MEDIAL BRANCH NERVE BLOCK) MBNB - LUMBAR FACET(MEDIAL BRANCH NERVE BLOCK) MBNB; Future  4. Lumbosacral spondylosis without myelopathy We will proceed with a lumbar facet therapeutic block #2 today.  We have gone over the risks and benefits of the procedure with her in full detail.  I will have her return to clinic in approximately 1 to 2 months for possible repeat injection and she may be a candidate for radiofrequency ablation.  Continue stretching strengthening  exercises as previously reviewed. - LUMBAR FACET(MEDIAL BRANCH NERVE BLOCK) MBNB - LUMBAR FACET(MEDIAL BRANCH NERVE BLOCK) MBNB; Future    ----------------------------------------------------------------------------------------------------------------------  I am having Cassidy Hernandez maintain her amLODipine, aspirin EC, atenolol, chlorthalidone, losartan, multivitamin, nitroGLYCERIN, omeprazole, rosuvastatin, zolpidem, losartan, and diphenhydramine-acetaminophen. We administered triamcinolone acetonide, ropivacaine (PF) 2 mg/mL (0.2%), and lidocaine (PF).   Meds ordered this encounter  Medications  . triamcinolone acetonide (KENALOG-40) injection 40 mg  . sodium chloride flush (NS) 0.9 % injection 10 mL  . ropivacaine (PF) 2 mg/mL (0.2%) (NAROPIN) injection 10 mL  . lidocaine (PF) (XYLOCAINE) 1 % injection 5 mL   Patient's Medications  New Prescriptions   No medications on file  Previous Medications   AMLODIPINE (NORVASC) 5 MG TABLET    Take by mouth daily.    ASPIRIN EC 81 MG TABLET  Take by mouth daily.    ATENOLOL (TENORMIN) 50 MG TABLET    Take by mouth 2 (two) times daily.    CHLORTHALIDONE (HYGROTON) 25 MG TABLET    TAKE 1/2 TABLET EVERY DAY   DIPHENHYDRAMINE-ACETAMINOPHEN (TYLENOL PM EXTRA STRENGTH) 25-500 MG TABS TABLET    Take by mouth.   LOSARTAN (COZAAR) 100 MG TABLET    Take 100 mg by mouth daily.    LOSARTAN (COZAAR) 100 MG TABLET    Take by mouth.   MULTIPLE VITAMIN (MULTIVITAMIN) CAPSULE    Take by mouth daily.    NITROGLYCERIN (NITROSTAT) 0.4 MG SL TABLET    Place under the tongue.   OMEPRAZOLE (PRILOSEC) 20 MG CAPSULE    Take by mouth daily.    ROSUVASTATIN (CRESTOR) 20 MG TABLET    Take by mouth daily.    ZOLPIDEM (AMBIEN) 10 MG TABLET    Take by mouth at bedtime as needed.   Modified Medications   No medications on file  Discontinued Medications   No medications on file    ----------------------------------------------------------------------------------------------------------------------  Follow-up: Return for procedure.  Procedure: Left side lumbar medial branch block under fluoroscopic guidance at L2-3 L3-4 L4-5 L5-S1 with sedation Patient was taken to the fluoroscopy suite and placed in the prone position.Vital signs were stable throughout the procedure. The  overlying area of skin on the left side was prepped with Betadine 3 and strict aseptic technique was utilized throughout the procedure. Flouroscopy was used to  identify the areas overlying the aforementioned facets at  L2-3 L3-4  L4-5 and  L5-S1. 1% lidocaine 1 cc was infiltrated subcutaneously and into the fascia with a 25-gauge needle at each of these sites. I then advanced a 25-gauge 3-1/2 inch Quinckie needle with the needle tip to lie at the "[facility]" portion of the [employer] dog". There was negative aspiration for heme or CSF and  no paresthesia. I then injected 2 cc of ropivacaine 0.2% mixed with 10 mg of triamcinolone at each of the aforementioned sites. These needles were withdrawn.  The patient tolerated the procedure without any difficulty and was convalesced discharged home in stable condition for follow-up as mentioned  Molli Barrows, MD

## 2018-05-02 DIAGNOSIS — Z0000 Encounter for general adult medical examination without abnormal findings: Secondary | ICD-10-CM | POA: Insufficient documentation

## 2018-05-14 ENCOUNTER — Other Ambulatory Visit: Payer: Self-pay | Admitting: Anesthesiology

## 2018-05-14 ENCOUNTER — Encounter: Payer: Self-pay | Admitting: Anesthesiology

## 2018-05-14 ENCOUNTER — Ambulatory Visit (HOSPITAL_BASED_OUTPATIENT_CLINIC_OR_DEPARTMENT_OTHER): Payer: Medicare Other | Admitting: Anesthesiology

## 2018-05-14 ENCOUNTER — Ambulatory Visit
Admission: RE | Admit: 2018-05-14 | Discharge: 2018-05-14 | Disposition: A | Discharge status: Home / Self Care | Payer: Medicare Other | Source: Ambulatory Visit | Attending: Anesthesiology | Admitting: Anesthesiology

## 2018-05-14 VITALS — BP 170/82 | HR 97 | Temp 98.1°F | Resp 21 | Ht 63.0 in | Wt 177.0 lb

## 2018-05-14 DIAGNOSIS — G894 Chronic pain syndrome: Secondary | ICD-10-CM | POA: Insufficient documentation

## 2018-05-14 DIAGNOSIS — M47817 Spondylosis without myelopathy or radiculopathy, lumbosacral region: Secondary | ICD-10-CM | POA: Diagnosis not present

## 2018-05-14 DIAGNOSIS — M47819 Spondylosis without myelopathy or radiculopathy, site unspecified: Secondary | ICD-10-CM

## 2018-05-14 DIAGNOSIS — R52 Pain, unspecified: Secondary | ICD-10-CM

## 2018-05-14 DIAGNOSIS — M549 Dorsalgia, unspecified: Secondary | ICD-10-CM | POA: Diagnosis present

## 2018-05-14 DIAGNOSIS — Z79899 Other long term (current) drug therapy: Secondary | ICD-10-CM | POA: Insufficient documentation

## 2018-05-14 DIAGNOSIS — M47816 Spondylosis without myelopathy or radiculopathy, lumbar region: Secondary | ICD-10-CM

## 2018-05-14 MED ORDER — ROPIVACAINE HCL 2 MG/ML IJ SOLN
INTRAMUSCULAR | Status: AC
  Filled 2018-05-14: qty 10

## 2018-05-14 MED ORDER — TRIAMCINOLONE ACETONIDE 40 MG/ML IJ SUSP
INTRAMUSCULAR | Status: AC
  Filled 2018-05-14: qty 1

## 2018-05-14 MED ORDER — LIDOCAINE HCL (PF) 1 % IJ SOLN
INTRAMUSCULAR | Status: AC
  Filled 2018-05-14: qty 5

## 2018-05-14 MED ORDER — SODIUM CHLORIDE 0.9% FLUSH: 10.0000 mL | Freq: Once | INTRAVENOUS | Status: DC

## 2018-05-14 MED ORDER — ROPIVACAINE HCL 2 MG/ML IJ SOLN
10.0000 mL | Freq: Once | INTRAMUSCULAR | Status: AC
  Administered 2018-05-14: 10 mL via EPIDURAL

## 2018-05-14 MED ORDER — LIDOCAINE HCL (PF) 1 % IJ SOLN
INTRAMUSCULAR | Status: AC
  Filled 2018-05-14: qty 10

## 2018-05-14 MED ORDER — TRIAMCINOLONE ACETONIDE 40 MG/ML IJ SUSP
40.0000 mg | Freq: Once | INTRAMUSCULAR | Status: AC
  Administered 2018-05-14: 40 mg

## 2018-05-14 MED ORDER — LIDOCAINE HCL (PF) 1 % IJ SOLN
15.0000 mL | Freq: Once | INTRAMUSCULAR | Status: AC
  Administered 2018-05-14: 15 mL via SUBCUTANEOUS

## 2018-05-14 NOTE — Patient Instructions (Signed)
____________________________________________________________________________________________  Post-procedure Information What to expect: Most procedures involve the use of a local anesthetic (numbing medicine), and a steroid (anti-inflammatory medicine).  The local anesthetics may cause temporary numbness and weakness of the legs or arms, depending on the location of the block. This numbness/weakness may last 4-6 hours, depending on the local anesthetic used. In rare instances, it can last up to 24 hours. While numb, you must be very careful not to injure the extremity.  After any procedure, you could expect the pain to get better within 15-20 minutes. This relief is temporary and may last 4-6 hours. Once the local anesthetics wears off, you could experience discomfort, possibly more than usual, for up to 10 (ten) days. In the case of radiofrequencies, it may last up to 6 weeks. Surgeries may take up to 8 weeks for the healing process. The discomfort is due to the irritation caused by needles going through skin and muscle. To minimize the discomfort, we recommend using ice the first day, and heat from then on. The ice should be applied for 15 minutes on, and 15 minutes off. Keep repeating this cycle until bedtime. Avoid applying the ice directly to the skin, to prevent frostbite. Heat should be used daily, until the pain improves (4-10 days). Be careful not to burn yourself.  Occasionally you may experience muscle spasms or cramps. These occur as a consequence of the irritation caused by the needle sticks to the muscle and the blood that will inevitably be lost into the surrounding muscle tissue. Blood tends to be very irritating to tissues, which tend to react by going into spasm. These spasms may start the same day of your procedure, but they may also take days to develop. This late onset type of spasm or cramp is usually caused by electrolyte imbalances triggered by the steroids, at the level of the  kidney. Cramps and spasms tend to respond well to muscle relaxants, multivitamins (some are triggered by the procedure, but may have their origins in vitamin deficiencies), and "Gatorade", or any sports drinks that can replenish any electrolyte imbalances. (If you are a diabetic, ask your pharmacist to get you a sugar-free brand.) Warm showers or baths may also be helpful. Stretching exercises are highly recommended.  General Instructions:  Be alert for signs of possible infection: redness, swelling, heat, red streaks, elevated temperature, and/or fever. These typically appear 4 to 6 days after the procedure. Immediately notify your doctor if you experience unusual bleeding, difficulty breathing, or loss of bowel or bladder control. If you experience increased pain, do not increase your pain medicine intake, unless instructed by your pain physician.  Post-Procedure Care:  Be careful in moving about. Muscle spasms in the area of the injection may occur. Applying ice or heat to the area is often helpful. The incidence of spinal headaches after epidural injections ranges between 1.4% and 6%. If you develop a headache that does not seem to respond to conservative therapy, please let your physician know. This can be treated with an epidural blood patch.   Post-procedure numbness or redness is to be expected, however it should average 4 to 6 hours. If numbness and weakness of your extremities begins to develop 4 to 6 hours after your procedure, and is felt to be progressing and worsening, immediately contact your physician.  Diet:  If you experience nausea, do not eat until this sensation goes away. If you had a "Stellate Ganglion Block" for upper extremity "Reflex Sympathetic Dystrophy", do not eat or   drink until your hoarseness goes away. In any case, always start with liquids first and if you tolerate them well, then slowly progress to more solid foods.  Activity:  For the first 4 to 6 hours after the  procedure, use caution in moving about as you may experience numbness and/or weakness. Use caution in cooking, using household electrical appliances, and climbing steps. If you need to reach your Doctor call our office: [phone] (During business hours) or [phone] (After business hours).  Business Hours: Monday-Thursday 8:00 am - 4:00 PM    Fridays: Closed     In case of an emergency: In case of emergency, call 911 or go to the nearest emergency room and have the physician there call us.  Interpretation of Procedure Every nerve block has two components: a diagnostic component, and a treatment component. Unrealistic expectations are the most common causes of "perceived failure".  In a perfect world, a single nerve block should be able to completely and permanently eliminate the pain. Sadly, the world is not perfect.  Most pain management nerve blocks are performed using local anesthetics and steroids. Steroids are responsible for any long-term benefit that you may experience. Their purpose is to decrease any chronic swelling that may exist in the area. Steroids begin to work immediately after being injected. However, most patients will not experience any benefits until 5 to 10 days after the injection, when the swelling has come down to the point where they can tell a difference. Steroids will only help if there is swelling to be treated. As such, they can assist with the diagnosis. If effective, they suggest an inflammatory component to the pain, and if ineffective, they rule out inflammation as the main cause or component of the problem. If the problem is one of mechanical compression, you will get no benefit from those steroids.   In the case of local anesthetics, they have a crucial role in the diagnosis of your condition. Most will begin to work within15 to 20 minutes after injection. The duration will depend on the type used (short- vs. Long-acting). It is of outmost importance that  patients keep tract of their pain, after the procedure. To assist with this matter, a "Post-procedure Pain Diary" is provided. Make sure to complete it and to bring it back to your follow-up appointment.  As long as the patient keeps accurate, detailed records of their symptoms after every procedure, and returns to have those interpreted, every procedure will provide us with invaluable information. Even a block that does not provide the patient with any relief, will always provide us with information about the mechanism and the origin of the pain. The only time a nerve block can be considered a waste of time is when patients do not keep track of the results, or do not keep their post-procedure appointment.  Reporting the results back to your physician The Pain Score  Pain is a subjective complaint. It cannot be seen, touched, or measured. We depend entirely on the patient's report of the pain in order to assess your condition and treatment. To evaluate the pain, we use a pain scale, where "0" means "No Pain", and a "10" is "the worst possible pain that you can even imagine" (i.e. something like been eaten alive by a shark or being torn apart by a lion).   Use the Pain Scale provided. You will frequently be asked to rate your pain. Please be accurate, remember that medical decisions will be based on your   responses. Please do not rate your pain above a 10. Doing so is actually interpreted as "symptom magnification" (exaggeration). To put this into perspective, when you tell us that your pain is at a 10 (ten), what you are saying is that there is nothing we can do to make this pain any worse. (Carefully think about that.) ____________________________________________________________________________________________  Pain Management Discharge Instructions  General Discharge Instructions :  If you need to reach your doctor call: Monday-Friday 8:00 am - 4:00 pm at [phone] or toll free [phone].   After clinic hours [phone] to have operator reach doctor.  Bring all of your medication bottles to all your appointments in the pain clinic.  To cancel or reschedule your appointment with Pain Management please remember to call 24 hours in advance to avoid a fee.  Refer to the educational materials which you have been given on: General Risks, I had my Procedure. Discharge Instructions, Post Sedation.  Post Procedure Instructions:  The drugs you were given will stay in your system until tomorrow, so for the next 24 hours you should not drive, make any legal decisions or drink any alcoholic beverages.  You may eat anything you prefer, but it is better to start with liquids then soups and crackers, and gradually work up to solid foods.  Please notify your doctor immediately if you have any unusual bleeding, trouble breathing or pain that is not related to your normal pain.  Depending on the type of procedure that was done, some parts of your body may feel week and/or numb.  This usually clears up by tonight or the next day.  Walk with the use of an assistive device or accompanied by an adult for the 24 hours.  You may use ice on the affected area for the first 24 hours.  Put ice in a Ziploc bag and cover with a towel and place against area 15 minutes on 15 minutes off.  You may switch to heat after 24 hours.Radiofrequency Lesioning Radiofrequency lesioning is a procedure that is performed to relieve pain. The procedure is often used for back, neck, or arm pain. Radiofrequency lesioning involves the use of a machine that creates radio waves to make heat. During the procedure, the heat is applied to the nerve that carries the pain signal. The heat damages the nerve and interferes with the pain signal. Pain relief usually starts about 2 weeks after the procedure and lasts for 6 months to 1 year. Tell a health care provider about:  Any allergies you have.  All medicines you are taking,  including vitamins, herbs, eye drops, creams, and over-the-counter medicines.  Any problems you or family members have had with anesthetic medicines.  Any blood disorders you have.  Any surgeries you have had.  Any medical conditions you have.  Whether you are pregnant or may be pregnant. What are the risks? Generally, this is a safe procedure. However, problems may occur, including:  Pain or soreness at the injection site.  Infection at the injection site.  Damage to nerves or blood vessels.  What happens before the procedure?  Ask your health care provider about: ? Changing or stopping your regular medicines. This is especially important if you are taking diabetes medicines or blood thinners. ? Taking medicines such as aspirin and ibuprofen. These medicines can thin your blood. Do not take these medicines before your procedure if your health care provider instructs you not to.  Follow instructions from your health care provider about eating or drinking  restrictions.  Plan to have someone take you home after the procedure.  If you go home right after the procedure, plan to have someone with you for 24 hours. What happens during the procedure?  You will be given one or more of the following: ? A medicine to help you relax (sedative). ? A medicine to numb the area (local anesthetic).  You will be awake during the procedure. You will need to be able to talk with the health care provider during the procedure.  With the help of a type of X-ray (fluoroscopy), the health care provider will insert a radiofrequency needle into the area to be treated.  Next, a wire that carries the radio waves (electrode) will be put through the radiofrequency needle. An electrical pulse will be sent through the electrode to verify the correct nerve. You will feel a tingling sensation, and you may have muscle twitching.  Then, the tissue that is around the needle tip will be heated by an electric  current that is passed using the radiofrequency machine. This will numb the nerves.  A bandage (dressing) will be put on the insertion area after the procedure is done. The procedure may vary among health care providers and hospitals. What happens after the procedure?  Your blood pressure, heart rate, breathing rate, and blood oxygen level will be monitored often until the medicines you were given have worn off.  Return to your normal activities as directed by your health care provider. This information is not intended to replace advice given to you by your health care provider. Make sure you discuss any questions you have with your health care provider. Document Released: 03/23/2011 Document Revised: 12/31/2015 Document Reviewed: 09/01/2014 Elsevier Interactive Patient Education  [employer].

## 2018-05-14 NOTE — Progress Notes (Signed)
Safety precautions to be maintained throughout the outpatient stay will include: orient to surroundings, keep bed in low position, maintain call bell within reach at all times, provide assistance with transfer out of bed and ambulation.  

## 2018-05-15 ENCOUNTER — Telehealth: Payer: Self-pay | Admitting: *Deleted

## 2018-05-15 NOTE — Telephone Encounter (Signed)
Denies complications post procedure. 

## 2018-05-16 NOTE — Progress Notes (Signed)
Subjective:  Patient ID: Cassidy Hernandez, female    DOB: Mar 19, 1941  Age: 77 y.o. MRN: [id number]  CC: Back Pain (lumbar left is worse )   Procedure: Left L2-3 L3-4 L4-5 L5-S1 medial branch block to the after mentioned facets under fluoroscopic guidance with no sedation  HPI Cassidy Hernandez presents for reevaluation.  She was last seen in August and had a left side facet.  She reports approximately a 70% improvement in her left lower back pain following the series of facet injections.  She generally gets approximately 6 to 8 weeks of relief with these.  It is been the most effective therapy for her.  She is previously had epidural injections in the past which have been ineffective.  Physical therapy unfortunately gave her no significant improvement and medication managements have failed her.  She feels that at this point the facet injections have given her substantial relief and she is making good progress with this.  She questions today whether a longer acting procedure such as the radiofrequency as previously described to her would be worthwhile.  No change in lower extremity strength or function or bowel bladder function is noted at this time.  Outpatient Medications Prior to Visit  Medication Sig Dispense Refill  . amLODipine (NORVASC) 5 MG tablet Take by mouth daily.     [name] aspirin EC 81 MG tablet Take by mouth daily.     . carvedilol (COREG) 6.25 MG tablet Take 6.25 mg by mouth daily.    . chlorthalidone (HYGROTON) 25 MG tablet TAKE 1/2 TABLET EVERY DAY    . diphenhydramine-acetaminophen (TYLENOL PM EXTRA STRENGTH) 25-500 MG TABS tablet Take by mouth.    . isosorbide mononitrate (IMDUR) 30 MG 24 hr tablet Take 30 mg by mouth daily.    [name] losartan (COZAAR) 100 MG tablet Take 100 mg by mouth daily.     . Multiple Vitamin (MULTIVITAMIN) capsule Take by mouth daily.     . nitroGLYCERIN (NITROSTAT) 0.4 MG SL tablet Place under the tongue.    [name] omeprazole (PRILOSEC) 20 MG capsule Take by mouth  daily.     . rosuvastatin (CRESTOR) 20 MG tablet Take by mouth daily.     [name] atenolol (TENORMIN) 50 MG tablet Take by mouth 2 (two) times daily.     [name] losartan (COZAAR) 100 MG tablet Take by mouth.    . zolpidem (AMBIEN) 10 MG tablet Take by mouth at bedtime as needed.      No facility-administered medications prior to visit.     Review of Systems CNS: No confusion or sedation Cardiac: No angina or palpitations GI: No abdominal pain or constipation Constitutional: No nausea vomiting fevers or chills  Objective:  BP (!) 170/82   Pulse 97   Temp 98.1 F (36.7 C) (Oral)   Resp (!) 21   Ht 5\' 3"  (1.6 m)   Wt 177 lb (80.3 kg)   SpO2 96%   BMI 31.35 kg/m    BP Readings from Last 3 Encounters:  05/14/18 (!) 170/82  03/27/18 (!) 147/94  02/07/18 (!) 182/76     Wt Readings from Last 3 Encounters:  05/14/18 177 lb (80.3 kg)  03/27/18 177 lb (80.3 kg)  02/07/18 180 lb (81.6 kg)     Physical Exam Pt is alert and oriented PERRL EOMI HEART IS RRR no murmur or rub MUSCULOSKELETAL reveals persistent paraspinous muscle tenderness in the left side.  It appears less severe than historically.  She does have pain  with extension left lateral rotation provoking her pain into the left posterior hip buttock and leg.  This is less prevalent on the right side.  Her muscle tone and bulk is at baseline.  Labs  No results found for: HGBA1C Lab Results  Component Value Date   CREATININE 1.09 04/19/2014    -------------------------------------------------------------------------------------------------------------------- Lab Results  Component Value Date   WBC 13.2 (H) 04/19/2014   HGB 15.3 04/19/2014   HCT 46.4 04/19/2014   PLT 289 04/19/2014   GLUCOSE 131 (H) 04/19/2014   ALT 71 (H) 04/19/2014   AST 26 04/19/2014   NA 136 04/19/2014   K 3.5 04/19/2014   CL 102 04/19/2014   CREATININE 1.09 04/19/2014   BUN 21 (H) 04/19/2014   CO2 26 04/19/2014     --------------------------------------------------------------------------------------------------------------------- Dg C-arm 1-60 Min-no Report  Result Date: 05/14/2018 Fluoroscopy was utilized by the requesting physician.  No radiographic interpretation.     Assessment & Plan:   Thu was seen today for back pain.  Diagnoses and all orders for this visit:  Chronic pain syndrome  Facet arthritis of lumbar region -     LUMBAR FACET(MEDIAL BRANCH NERVE BLOCK) MBNB  Lumbosacral spondylosis without myelopathy -     LUMBAR FACET(MEDIAL BRANCH NERVE BLOCK) MBNB  Other orders -     triamcinolone acetonide (KENALOG-40) injection 40 mg -     sodium chloride flush (NS) 0.9 % injection 10 mL -     ropivacaine (PF) 2 mg/mL (0.2%) (NAROPIN) injection 10 mL -     lidocaine (PF) (XYLOCAINE) 1 % injection 15 mL        ----------------------------------------------------------------------------------------------------------------------  Problem List Items Addressed This Visit    None    Visit Diagnoses    Chronic pain syndrome    -  Primary   Facet arthritis of lumbar region       Relevant Medications   triamcinolone acetonide (KENALOG-40) injection 40 mg (Completed)   Lumbosacral spondylosis without myelopathy       Relevant Medications   triamcinolone acetonide (KENALOG-40) injection 40 mg (Completed)        ----------------------------------------------------------------------------------------------------------------------  1. Chronic pain syndrome   2. Facet arthritis of lumbar region We will proceed with a left side lumbar facet block as reviewed once again today.  She seems to be making good progress with this.  Hopefully we can proceed with a radiofrequency which might give her a chance of longer acting relief.  We have gone over the risks and benefits of the procedure with her in full detail today and her questions have been answered.  I want her to continue with  stretching strengthening exercises as tolerated.  Our plan at this point is for a return in the next 2 months for a radiofrequency ablation of the left lumbar facets - LUMBAR FACET(MEDIAL BRANCH NERVE BLOCK) MBNB  3. Lumbosacral spondylosis without myelopathy  - LUMBAR FACET(MEDIAL BRANCH NERVE BLOCK) MBNB    ----------------------------------------------------------------------------------------------------------------------  I am having Elfredia Nevins maintain her amLODipine, aspirin EC, atenolol, chlorthalidone, losartan, multivitamin, nitroGLYCERIN, omeprazole, rosuvastatin, zolpidem, losartan, diphenhydramine-acetaminophen, carvedilol, and isosorbide mononitrate. We administered triamcinolone acetonide, ropivacaine (PF) 2 mg/mL (0.2%), and lidocaine (PF).   Meds ordered this encounter  Medications  . triamcinolone acetonide (KENALOG-40) injection 40 mg  . sodium chloride flush (NS) 0.9 % injection 10 mL  . ropivacaine (PF) 2 mg/mL (0.2%) (NAROPIN) injection 10 mL  . lidocaine (PF) (XYLOCAINE) 1 % injection 15 mL   Patient's Medications  New Prescriptions  No medications on file  Previous Medications   AMLODIPINE (NORVASC) 5 MG TABLET    Take by mouth daily.    ASPIRIN EC 81 MG TABLET    Take by mouth daily.    ATENOLOL (TENORMIN) 50 MG TABLET    Take by mouth 2 (two) times daily.    CARVEDILOL (COREG) 6.25 MG TABLET    Take 6.25 mg by mouth daily.   CHLORTHALIDONE (HYGROTON) 25 MG TABLET    TAKE 1/2 TABLET EVERY DAY   DIPHENHYDRAMINE-ACETAMINOPHEN (TYLENOL PM EXTRA STRENGTH) 25-500 MG TABS TABLET    Take by mouth.   ISOSORBIDE MONONITRATE (IMDUR) 30 MG 24 HR TABLET    Take 30 mg by mouth daily.   LOSARTAN (COZAAR) 100 MG TABLET    Take 100 mg by mouth daily.    LOSARTAN (COZAAR) 100 MG TABLET    Take by mouth.   MULTIPLE VITAMIN (MULTIVITAMIN) CAPSULE    Take by mouth daily.    NITROGLYCERIN (NITROSTAT) 0.4 MG SL TABLET    Place under the tongue.   OMEPRAZOLE (PRILOSEC) 20  MG CAPSULE    Take by mouth daily.    ROSUVASTATIN (CRESTOR) 20 MG TABLET    Take by mouth daily.    ZOLPIDEM (AMBIEN) 10 MG TABLET    Take by mouth at bedtime as needed.   Modified Medications   No medications on file  Discontinued Medications   No medications on file   ----------------------------------------------------------------------------------------------------------------------  Follow-up: No follow-ups on file.  Procedure: Left side lumbar medial branch block under fluoroscopic guidance at L2-3 L3-4 L4-5 L5-S1 with sedation Patient was taken to the fluoroscopy suite and placed in the prone position.Vital signs were stable throughout the procedure. The  overlying area of skin on the left side was prepped with Betadine 3 and strict aseptic technique was utilized throughout the procedure. Flouroscopy was used to  identify the areas overlying the aforementioned facets at  L2-3 L3-4  L4-5 and  L5-S1. 1% lidocaine 1 cc was infiltrated subcutaneously and into the fascia with a 25-gauge needle at each of these sites. I then advanced a 22-gauge 3-1/2 inch Quinckie needle with the needle tip to lie at the "[facility]" portion of the [employer] dog". There was negative aspiration for heme or CSF and  no paresthesia. I then injected 2 cc of ropivacaine 0.2% mixed with 10 mg of triamcinolone at each of the aforementioned sites. These needles were withdrawn.  The patient tolerated the procedure without any difficulty and was convalesced discharged home in stable condition for follow-up as mentioned  Molli Barrows, MD

## 2018-06-04 ENCOUNTER — Telehealth: Payer: Self-pay | Admitting: *Deleted

## 2018-06-07 ENCOUNTER — Ambulatory Visit: Payer: No Typology Code available for payment source | Admitting: Anesthesiology

## 2018-06-11 ENCOUNTER — Ambulatory Visit: Payer: No Typology Code available for payment source | Admitting: Anesthesiology

## 2018-06-12 IMAGING — MG MM  DIGITAL SCREENING BREAST BILAT IMPLANT W/ TOMO W/ CAD
9 of 16 series · 9 of 32 positions shown · non-contrast
Comparison: Previous exam(s).

CLINICAL DATA: Screening.

EXAM:
DIGITAL SCREENING BILATERAL MAMMOGRAM WITH IMPLANTS, CAD AND TOMO
The patient has retroglandular implants. Standard and implant
displaced views were performed.

[L CC]
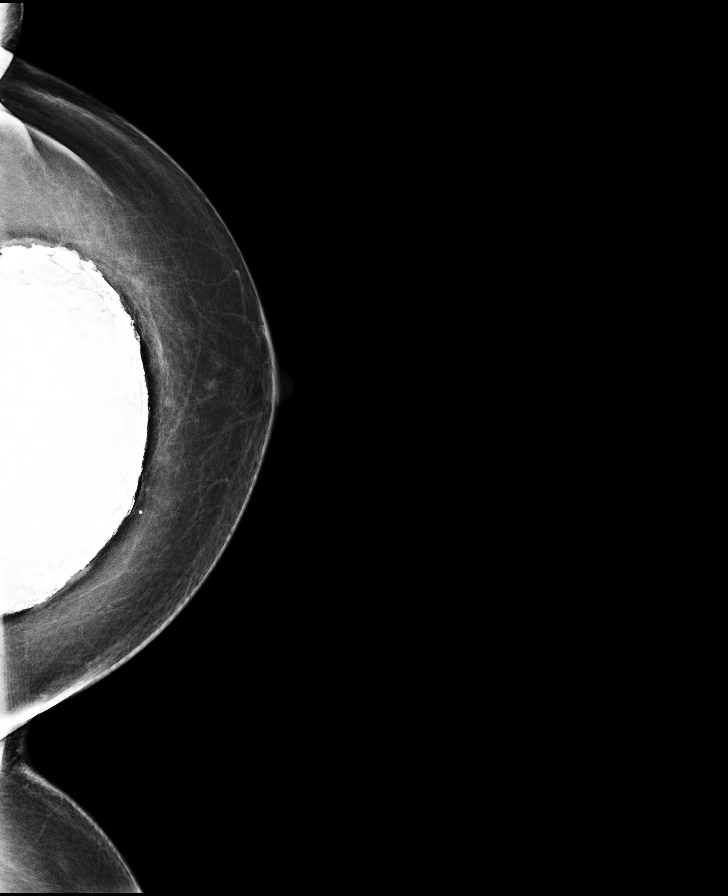

[R MLO (1 of 2)]
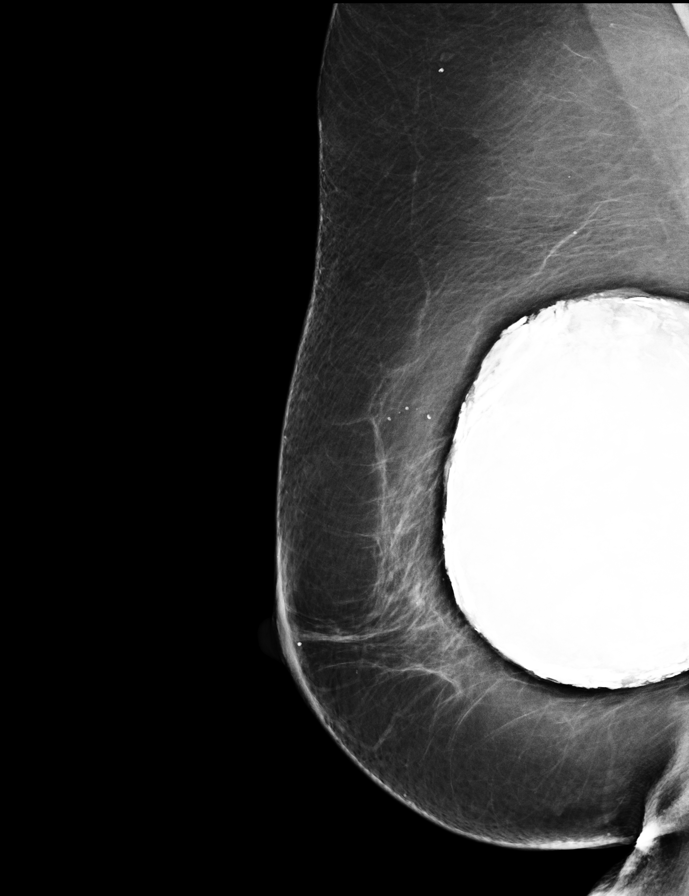

[R CC (1 of 2)]
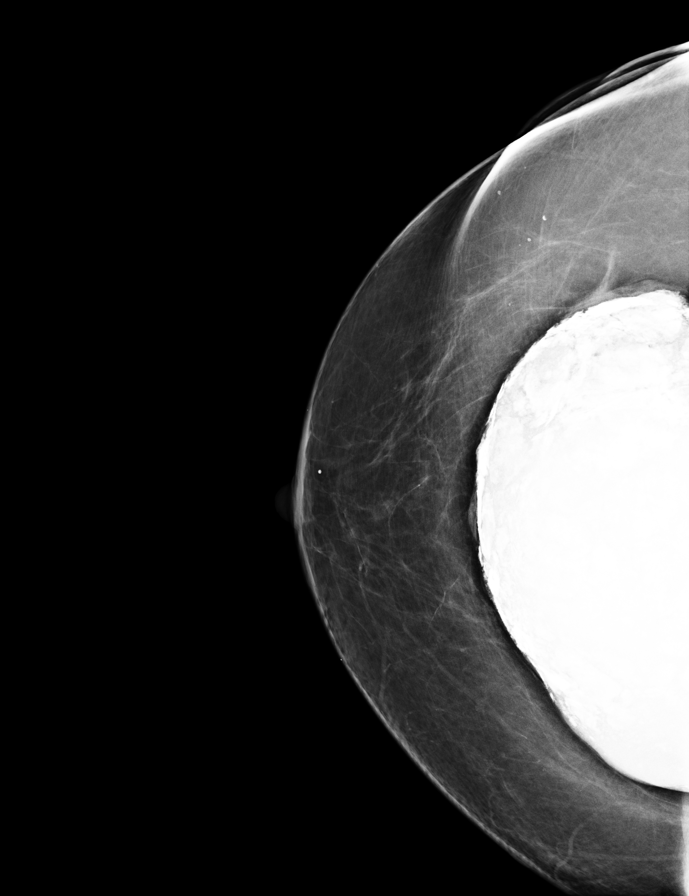

[L MLO (1 of 2)]
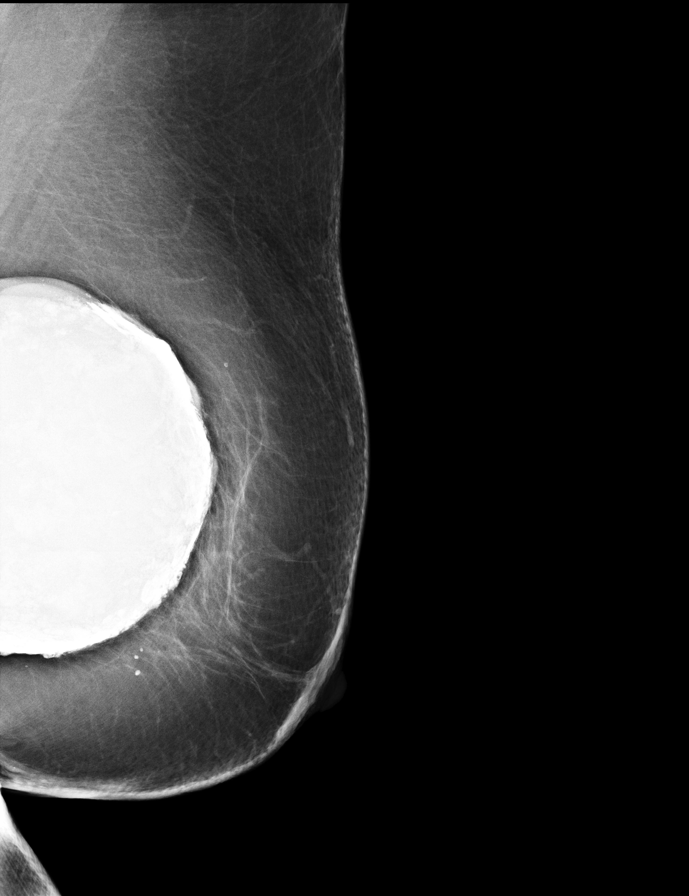

[R MLO (2 of 2)]
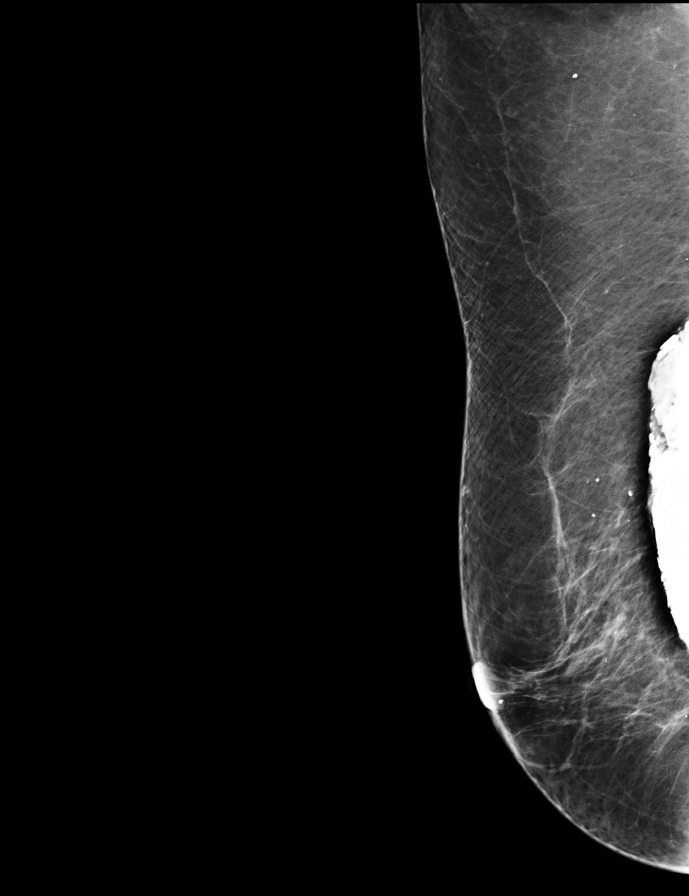

[R CC (2 of 2)]
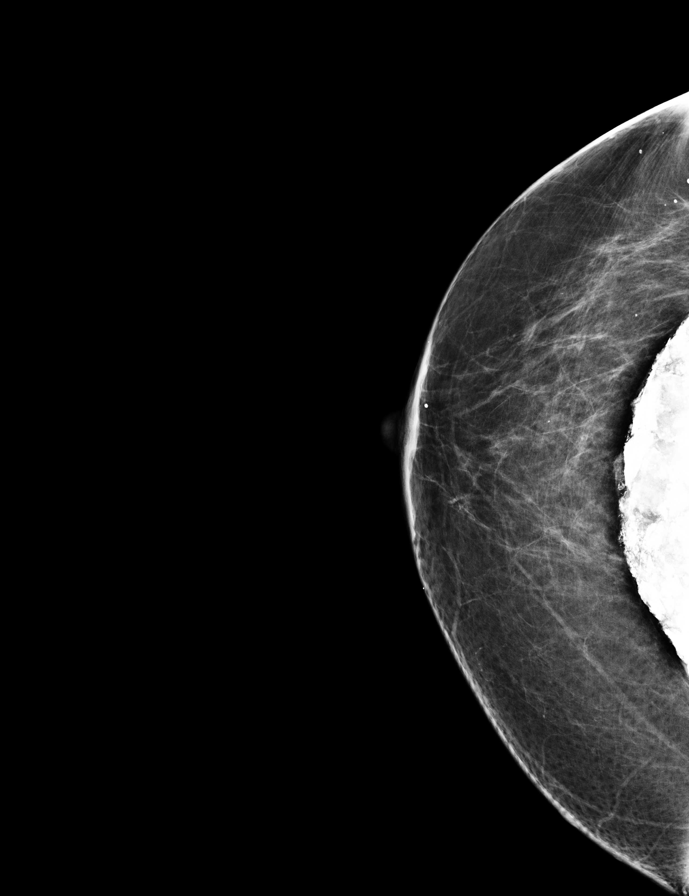

[L CC synth-2D]
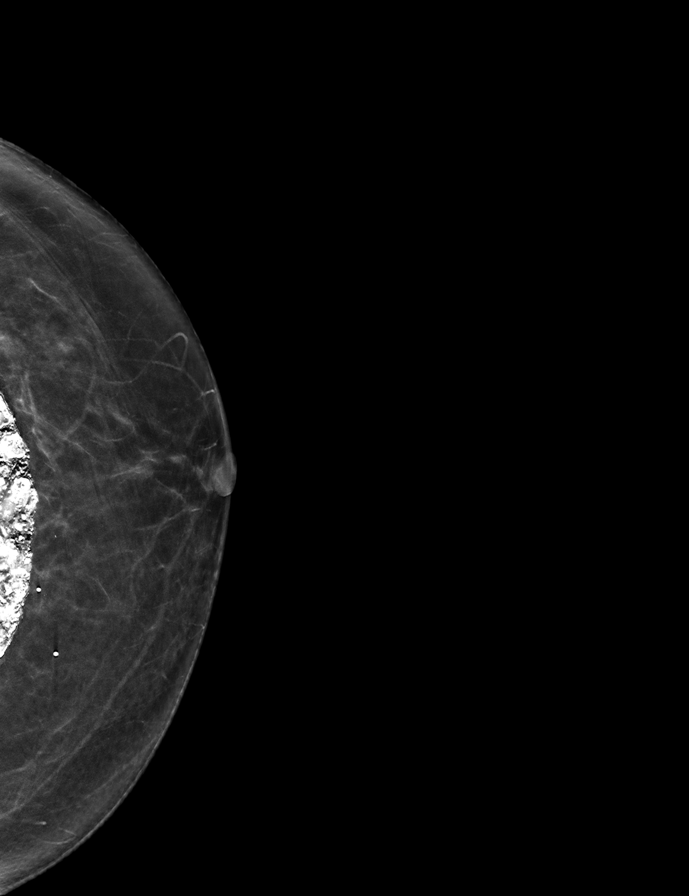

[L MLO synth-2D]
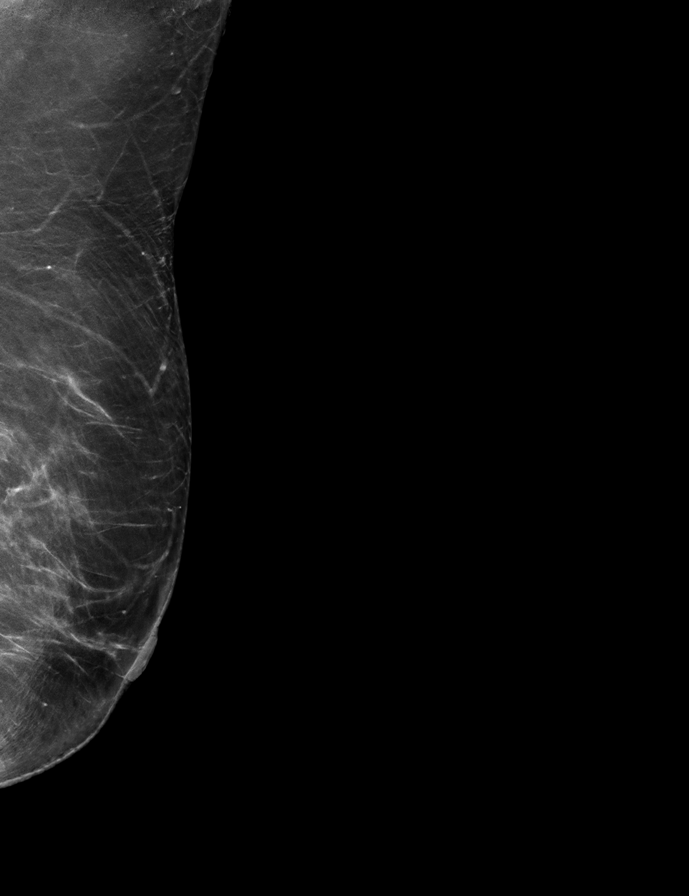

[L MLO (2 of 2)]
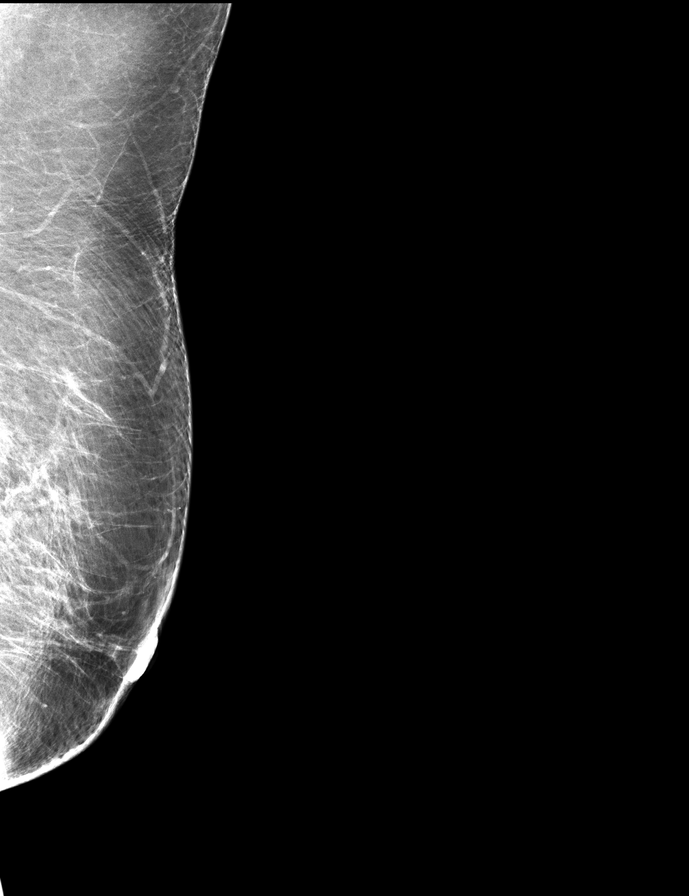

[9 of 32 positions shown; findings below may reference images not displayed]

ACR Breast Density Category b: There are scattered areas of
fibroglandular density.
FINDINGS: There are no findings suspicious for malignancy. Images were
processed with CAD.
IMPRESSION: No mammographic evidence of malignancy. A result letter of this
screening mammogram will be mailed directly to the patient.

RECOMMENDATION:
Screening mammogram in one year. (Code:VZ-A-PDF)

BI-RADS CATEGORY  1:  Negative.

## 2018-07-03 ENCOUNTER — Other Ambulatory Visit: Payer: Self-pay | Admitting: Anesthesiology

## 2018-07-03 ENCOUNTER — Ambulatory Visit (HOSPITAL_BASED_OUTPATIENT_CLINIC_OR_DEPARTMENT_OTHER): Payer: Medicare Other | Admitting: Anesthesiology

## 2018-07-03 ENCOUNTER — Encounter: Payer: Self-pay | Admitting: Anesthesiology

## 2018-07-03 ENCOUNTER — Ambulatory Visit
Admission: RE | Admit: 2018-07-03 | Discharge: 2018-07-03 | Disposition: A | Discharge status: Home / Self Care | Payer: Medicare Other | Source: Ambulatory Visit | Attending: Anesthesiology | Admitting: Anesthesiology

## 2018-07-03 ENCOUNTER — Other Ambulatory Visit: Payer: Self-pay

## 2018-07-03 VITALS — BP 132/68 | HR 75 | Temp 98.4°F | Resp 16 | Ht 63.0 in | Wt 177.0 lb

## 2018-07-03 DIAGNOSIS — M47816 Spondylosis without myelopathy or radiculopathy, lumbar region: Secondary | ICD-10-CM

## 2018-07-03 DIAGNOSIS — M545 Low back pain, unspecified: Secondary | ICD-10-CM

## 2018-07-03 DIAGNOSIS — G8929 Other chronic pain: Secondary | ICD-10-CM

## 2018-07-03 DIAGNOSIS — G894 Chronic pain syndrome: Secondary | ICD-10-CM | POA: Diagnosis present

## 2018-07-03 DIAGNOSIS — R52 Pain, unspecified: Secondary | ICD-10-CM | POA: Insufficient documentation

## 2018-07-03 DIAGNOSIS — M47817 Spondylosis without myelopathy or radiculopathy, lumbosacral region: Secondary | ICD-10-CM | POA: Diagnosis present

## 2018-07-03 MED ORDER — DEXAMETHASONE SODIUM PHOSPHATE 10 MG/ML IJ SOLN
10.0000 mg | Freq: Once | INTRAMUSCULAR | Status: AC
  Administered 2018-07-03: 10 mg

## 2018-07-03 MED ORDER — ROPIVACAINE HCL 2 MG/ML IJ SOLN
INTRAMUSCULAR | Status: AC
  Filled 2018-07-03: qty 10

## 2018-07-03 MED ORDER — SODIUM CHLORIDE 0.9% FLUSH: 10.0000 mL | Freq: Once | INTRAVENOUS | Status: DC

## 2018-07-03 MED ORDER — FENTANYL CITRATE (PF) 100 MCG/2ML IJ SOLN
100.0000 ug | Freq: Once | INTRAMUSCULAR | Status: AC
  Administered 2018-07-03: 25 ug via INTRAVENOUS

## 2018-07-03 MED ORDER — HYDROCODONE-ACETAMINOPHEN 5-325 MG PO TABS: 1.0000 | ORAL_TABLET | Freq: Four times a day (QID) | ORAL | 0 refills | Status: AC | PRN

## 2018-07-03 MED ORDER — MIDAZOLAM HCL 5 MG/5ML IJ SOLN
INTRAMUSCULAR | Status: AC
  Filled 2018-07-03: qty 5

## 2018-07-03 MED ORDER — LIDOCAINE HCL (PF) 1 % IJ SOLN
INTRAMUSCULAR | Status: AC
  Filled 2018-07-03: qty 10

## 2018-07-03 MED ORDER — LIDOCAINE HCL (PF) 1 % IJ SOLN
5.0000 mL | Freq: Once | INTRAMUSCULAR | Status: AC
  Administered 2018-07-03: 10 mL via SUBCUTANEOUS

## 2018-07-03 MED ORDER — FENTANYL CITRATE (PF) 100 MCG/2ML IJ SOLN
INTRAMUSCULAR | Status: AC
  Filled 2018-07-03: qty 2

## 2018-07-03 MED ORDER — DEXAMETHASONE SODIUM PHOSPHATE 10 MG/ML IJ SOLN
INTRAMUSCULAR | Status: AC
  Filled 2018-07-03: qty 1

## 2018-07-03 MED ORDER — ROPIVACAINE HCL 2 MG/ML IJ SOLN
10.0000 mL | Freq: Once | INTRAMUSCULAR | Status: AC
  Administered 2018-07-03: 9 mL via EPIDURAL

## 2018-07-03 MED ORDER — TRIAMCINOLONE ACETONIDE 40 MG/ML IJ SUSP
INTRAMUSCULAR | Status: AC
  Filled 2018-07-03: qty 1

## 2018-07-03 MED ORDER — LACTATED RINGERS IV SOLN
1000.0000 mL | INTRAVENOUS | Status: DC
  Administered 2018-07-03: 1000 mL via INTRAVENOUS

## 2018-07-03 MED ORDER — MIDAZOLAM HCL 5 MG/5ML IJ SOLN
5.0000 mg | Freq: Once | INTRAMUSCULAR | Status: AC
  Administered 2018-07-03: 1 mg via INTRAVENOUS

## 2018-07-03 NOTE — Progress Notes (Signed)
Safety precautions to be maintained throughout the outpatient stay will include: orient to surroundings, keep bed in low position, maintain call bell within reach at all times, provide assistance with transfer out of bed and ambulation.  

## 2018-07-03 NOTE — Patient Instructions (Addendum)
You have been given prescription for Hydrocodone-acetaminophen to last 10 days following your procedure. ___________________________________________________________________________________________  Post-Radiofrequency (RF) Discharge Instructions  You have just completed a Radiofrequency Neurotomy.  The following instructions will provide you with information and guidelines for self-care upon discharge.  If at any time you have questions or concerns please call your physician. DO NOT DRIVE YOURSELF!!  Instructions:  Apply ice: Fill a plastic sandwich bag with crushed ice. Cover it with a small towel and apply to injection site. Apply for 15 minutes then remove x 15 minutes. Repeat sequence on day of procedure, until you go to bed. The purpose is to minimize swelling and discomfort after procedure.  Apply heat: Apply heat to procedure site starting the day following the procedure. The purpose is to treat any soreness and discomfort from the procedure.  Food intake: No eating limitations, unless stipulated above.  Nevertheless, if you have had sedation, you may experience some nausea.  In this case, it may be wise to wait at least two hours prior to resuming regular diet.  Physical activities: Keep activities to a minimum for the first 8 hours after the procedure. For the first 24 hours after the procedure, do not drive a motor vehicle,  Operate heavy machinery, power tools, or handle any weapons.  Consider walking with the use of an assistive device or accompanied by an adult for the first 24 hours.  Do not drink alcoholic beverages including beer.  Do not make any important decisions or sign any legal documents. Go home and rest today.  Resume activities tomorrow, as tolerated.  Use caution in moving about as you may experience mild leg weakness.  Use caution in cooking, use of household electrical appliances and climbing steps.  Driving: If you have received any sedation, you are not allowed to  drive for 24 hours after your procedure.  Blood thinner: Restart your blood thinner 6 hours after your procedure. (Only for those taking blood thinners)  Insulin: As soon as you can eat, you may resume your normal dosing schedule. (Only for those taking insulin)  Medications: May resume pre-procedure medications.  Do not take any drugs, other than what has been prescribed to you.  Infection prevention: Keep procedure site clean and dry.  Post-procedure Pain Diary: Extremely important that this be done correctly and accurately. Recorded information will be used to determine the next step in treatment.  Pain evaluated is that of treated area only. Do not include pain from an untreated area.  Complete every hour, on the hour, for the initial 8 hours. Set an alarm to help you do this part accurately.  Do not go to sleep and have it completed later. It will not be accurate.  Follow-up appointment: Keep your follow-up appointment after the procedure. Usually 2 weeks for most procedures. (6 weeks in the case of radiofrequency.) Bring you pain diary.   Expect:  From numbing medicine (AKA: Local Anesthetics): Numbness or decrease in pain.  Onset: Full effect within 15 minutes of injected.  Duration: It will depend on the type of local anesthetic used. On the average, 1 to 8 hours.   From steroids: Decrease in swelling or inflammation. Once inflammation is improved, relief of the pain will follow.  Onset of benefits: Depends on the amount of swelling present. The more swelling, the longer it will take for the benefits to be seen. In some cases, up to 10 days.  Duration: Steroids will stay in the system x 2 weeks. Duration of  benefits will depend on multiple posibilities including persistent irritating factors.  From procedure: Some discomfort is to be expected once the numbing medicine wears off. This should be minimal if ice and heat are applied as instructed.  Call if:  You experience  numbness and weakness that gets worse with time, as opposed to wearing off.  He experience any unusual bleeding, difficulty breathing, or loss of the ability to control your bowel and bladder. (This applies to Spinal procedures only)  You experience any redness, swelling, heat, red streaks, elevated temperature, fever, or any other signs of a possible infection.  Emergency Numbers:  [place] hours (Monday - Thursday, 8:00 AM - 4:00 PM) (Friday, 9:00 AM - 12:00 Noon): [phone]  After hours: [phone] ____________________________________________________________________________________________   ______________________________________________________________________________________________  Specialty Pain Scale  Introduction:  There are significant differences in how pain is reported. The word pain usually refers to physical pain, but it is also a common synonym of suffering. The medical community uses a scale from 0 (zero) to 10 (ten) to report pain level. Zero (0) is described as "no pain", while ten (10) is described as "the worse pain you can imagine". The problem with this scale is that physical pain is reported along with suffering. Suffering refers to mental pain, or more often yet it refers to any unpleasant feeling, emotion or aversion associated with the perception of harm or threat of harm. It is the psychological component of pain.  Pain Specialists prefer to separate the two components. The pain scale used by this practice is the Verbal Numerical Rating Scale (VNRS-11). This scale is for the physical pain only. DO NOT INCLUDE how your pain psychologically affects you. This scale is for adults 52 years of age and older. It has 11 (eleven) levels. The 1st level is 0/10. This means: "right now, I have no pain". In the context of pain management, it also means: "right now, my physical pain is under control with the current therapy".  General Information:  The scale  should reflect your current level of pain. Unless you are specifically asked for the level of your worst pain, or your average pain. If you are asked for one of these two, then it should be understood that it is over the past 24 hours.  Levels 1 (one) through 5 (five) are described below, and can be treated as an outpatient. Ambulatory pain management facilities such as ours are more than adequate to treat these levels. Levels 6 (six) through 10 (ten) are also described below, however, these must be treated as a hospitalized patient. While levels 6 (six) and 7 (seven) may be evaluated at an urgent care facility, levels 8 (eight) through 10 (ten) constitute medical emergencies and as such, they belong in a hospital's emergency department. When having these levels (as described below), do not come to our office. Our facility is not equipped to manage these levels. Go directly to an urgent care facility or an emergency department to be evaluated.  Definitions:  Activities of Daily Living (ADL): Activities of daily living (ADL or ADLs) is a term used in healthcare to refer to people's daily self-care activities. Health professionals often use a person's ability or inability to perform ADLs as a measurement of their functional status, particularly in regard to people post injury, with disabilities and the elderly. There are two ADL levels: Basic and Instrumental. Basic Activities of Daily Living (BADL  or BADLs) consist of self-care tasks that include: Bathing and showering; personal  hygiene and grooming (including brushing/combing/styling hair); dressing; Toilet hygiene (getting to the toilet, cleaning oneself, and getting back up); eating and self-feeding (not including cooking or chewing and swallowing); functional mobility, often referred to as "transferring", as measured by the ability to walk, get in and out of bed, and get into and out of a chair; the broader definition (moving from one place to another  while performing activities) is useful for people with different physical abilities who are still able to get around independently. Basic ADLs include the things many people do when they get up in the morning and get ready to go out of the house: get out of bed, go to the toilet, bathe, dress, groom, and eat. On the average, loss of function typically follows a particular order. Hygiene is the first to go, followed by loss of toilet use and locomotion. The last to go is the ability to eat. When there is only one remaining area in which the person is independent, there is a 62.9% chance that it is eating and only a 3.5% chance that it is hygiene. Instrumental Activities of Daily Living (IADL or IADLs) are not necessary for fundamental functioning, but they let an individual live independently in a community. IADL consist of tasks that include: cleaning and maintaining the house; home establishment and maintenance; care of others (including selecting and supervising caregivers); care of pets; child rearing; managing money; managing financials (investments, etc.); meal preparation and cleanup; shopping for groceries and necessities; moving within the community; safety procedures and emergency responses; health management and maintenance (taking prescribed medications); and using the telephone or other form of communication.  Instructions:  Most patients tend to report their pain as a combination of two factors, their physical pain and their psychosocial pain. This last one is also known as "suffering" and it is reflection of how physical pain affects you socially and psychologically. From now on, report them separately.  From this point on, when asked to report your pain level, report only your physical pain. Use the following table for reference.  Pain Clinic Pain Levels (0-5/10)  Pain Level Score  Description  No Pain 0   Mild pain 1 Nagging, annoying, but does not interfere with basic activities of daily  living (ADL). Patients are able to eat, bathe, get dressed, toileting (being able to get on and off the toilet and perform personal hygiene functions), transfer (move in and out of bed or a chair without assistance), and maintain continence (able to control bladder and bowel functions). Blood pressure and heart rate are unaffected. A normal heart rate for a healthy adult ranges from 60 to 100 bpm (beats per minute).   Mild to moderate pain 2 Noticeable and distracting. Impossible to hide from other people. More frequent flare-ups. Still possible to adapt and function close to normal. It can be very annoying and may have occasional stronger flare-ups. With discipline, patients may get used to it and adapt.   Moderate pain 3 Interferes significantly with activities of daily living (ADL). It becomes difficult to feed, bathe, get dressed, get on and off the toilet or to perform personal hygiene functions. Difficult to get in and out of bed or a chair without assistance. Very distracting. With effort, it can be ignored when deeply involved in activities.   Moderately severe pain 4 Impossible to ignore for more than a few minutes. With effort, patients may still be able to manage work or participate in some social activities. Very difficult to concentrate.  Signs of autonomic nervous system discharge are evident: dilated pupils (mydriasis); mild sweating (diaphoresis); sleep interference. Heart rate becomes elevated (>115 bpm). Diastolic blood pressure (lower number) rises above 100 mmHg. Patients find relief in laying down and not moving.   Severe pain 5 Intense and extremely unpleasant. Associated with frowning face and frequent crying. Pain overwhelms the senses.  Ability to do any activity or maintain social relationships becomes significantly limited. Conversation becomes difficult. Pacing back and forth is common, as getting into a comfortable position is nearly impossible. Pain wakes you up from deep sleep.  Physical signs will be obvious: pupillary dilation; increased sweating; goosebumps; brisk reflexes; cold, clammy hands and feet; nausea, vomiting or dry heaves; loss of appetite; significant sleep disturbance with inability to fall asleep or to remain asleep. When persistent, significant weight loss is observed due to the complete loss of appetite and sleep deprivation.  Blood pressure and heart rate becomes significantly elevated. Caution: If elevated blood pressure triggers a pounding headache associated with blurred vision, then the patient should immediately seek attention at an urgent or emergency care unit, as these may be signs of an impending stroke.    Emergency Department Pain Levels (6-10/10)  Emergency Room Pain 6 Severely limiting. Requires emergency care and should not be seen or managed at an outpatient pain management facility. Communication becomes difficult and requires great effort. Assistance to reach the emergency department may be required. Facial flushing and profuse sweating along with potentially dangerous increases in heart rate and blood pressure will be evident.   Distressing pain 7 Self-care is very difficult. Assistance is required to transport, or use restroom. Assistance to reach the emergency department will be required. Tasks requiring coordination, such as bathing and getting dressed become very difficult.   Disabling pain 8 Self-care is no longer possible. At this level, pain is disabling. The individual is unable to do even the most "basic" activities such as walking, eating, bathing, dressing, transferring to a bed, or toileting. Fine motor skills are lost. It is difficult to think clearly.   Incapacitating pain 9 Pain becomes incapacitating. Thought processing is no longer possible. Difficult to remember your own name. Control of movement and coordination are lost.   The worst pain imaginable 10 At this level, most patients pass out from pain. When this level is  reached, collapse of the autonomic nervous system occurs, leading to a sudden drop in blood pressure and heart rate. This in turn results in a temporary and dramatic drop in blood flow to the brain, leading to a loss of consciousness. Fainting is one of the body's self defense mechanisms. Passing out puts the brain in a calmed state and causes it to shut down for a while, in order to begin the healing process.    Summary: 1. Refer to this scale when providing [place] with your pain level. 2. Be accurate and careful when reporting your pain level. This will help with your care. 3. Over-reporting your pain level will lead to loss of credibility. 4. Even a level of 1/10 means that there is pain and will be treated at our facility. 5. High, inaccurate reporting will be documented as "Symptom Exaggeration", leading to loss of credibility and suspicions of possible secondary gains such as obtaining more narcotics, or wanting to appear disabled, for fraudulent reasons. 6. Only pain levels of 5 or below will be seen at our facility. 7. Pain levels of 6 and above will be sent to the Emergency Department and the  appointment cancelled. ______________________________________________________________________________________________  Radiofrequency Lesioning Radiofrequency lesioning is a procedure that is performed to relieve pain. The procedure is often used for back, neck, or arm pain. Radiofrequency lesioning involves the use of a machine that creates radio waves to make heat. During the procedure, the heat is applied to the nerve that carries the pain signal. The heat damages the nerve and interferes with the pain signal. Pain relief usually starts about 2 weeks after the procedure and lasts for 6 months to 1 year. Tell a health care provider about:  Any allergies you have.  All medicines you are taking, including vitamins, herbs, eye drops, creams, and over-the-counter medicines.  Any problems you or family  members have had with anesthetic medicines.  Any blood disorders you have.  Any surgeries you have had.  Any medical conditions you have.  Whether you are pregnant or may be pregnant. What are the risks? Generally, this is a safe procedure. However, problems may occur, including:  Pain or soreness at the injection site.  Infection at the injection site.  Damage to nerves or blood vessels.  What happens before the procedure?  Ask your health care provider about: ? Changing or stopping your regular medicines. This is especially important if you are taking diabetes medicines or blood thinners. ? Taking medicines such as aspirin and ibuprofen. These medicines can thin your blood. Do not take these medicines before your procedure if your health care provider instructs you not to.  Follow instructions from your health care provider about eating or drinking restrictions.  Plan to have someone take you home after the procedure.  If you go home right after the procedure, plan to have someone with you for 24 hours. What happens during the procedure?  You will be given one or more of the following: ? A medicine to help you relax (sedative). ? A medicine to numb the area (local anesthetic).  You will be awake during the procedure. You will need to be able to talk with the health care provider during the procedure.  With the help of a type of X-ray (fluoroscopy), the health care provider will insert a radiofrequency needle into the area to be treated.  Next, a wire that carries the radio waves (electrode) will be put through the radiofrequency needle. An electrical pulse will be sent through the electrode to verify the correct nerve. You will feel a tingling sensation, and you may have muscle twitching.  Then, the tissue that is around the needle tip will be heated by an electric current that is passed using the radiofrequency machine. This will numb the nerves.  A bandage (dressing)  will be put on the insertion area after the procedure is done. The procedure may vary among health care providers and hospitals. What happens after the procedure?  Your blood pressure, heart rate, breathing rate, and blood oxygen level will be monitored often until the medicines you were given have worn off.  Return to your normal activities as directed by your health care provider. This information is not intended to replace advice given to you by your health care provider. Make sure you discuss any questions you have with your health care provider. Document Released: 03/23/2011 Document Revised: 12/31/2015 Document Reviewed: 09/01/2014 [employer] Interactive Patient Education  [employer].

## 2018-07-03 NOTE — Progress Notes (Signed)
Subjective:  Patient ID: Cassidy Hernandez, female    DOB: [birth year]  Age: 77 y.o. MRN: [id number]  CC: Procedure (RFA)   Procedure: Left lumbar radiofrequency ablation to the medial branches of L3-4 L4-5 and L5-S1 under fluoroscopic guidance with moderate sedation  HPI BETRICE WANAT presents for reevaluation.  She was last seen a few weeks ago and presents today for her radiofrequency ablation of the lumbar facet nerve for her chronic low back pain left side.  The quality characteristic distribution pain of been otherwise stable in nature with no new changes in lower extremity strength or function or bowel bladder function.  She is been doing her physical therapy exercises with no significant improvement noted.  This is been a chronic problem for her and she is responded favorably to previous diagnostic and therapeutic lumbar facet blocks.  She generally gets approximately 50 to 75% relief lasting 6 to 8 weeks.  Outpatient Medications Prior to Visit  Medication Sig Dispense Refill  . amLODipine (NORVASC) 5 MG tablet Take by mouth daily.     [name] aspirin EC 81 MG tablet Take by mouth daily.     [name] atenolol (TENORMIN) 50 MG tablet Take by mouth 2 (two) times daily.     . carvedilol (COREG) 6.25 MG tablet Take 6.25 mg by mouth daily.    . chlorthalidone (HYGROTON) 25 MG tablet TAKE 1/2 TABLET EVERY DAY    . diphenhydramine-acetaminophen (TYLENOL PM EXTRA STRENGTH) 25-500 MG TABS tablet Take by mouth.    . ezetimibe (ZETIA) 10 MG tablet TK 1 T PO QD  10  . isosorbide mononitrate (IMDUR) 30 MG 24 hr tablet Take 30 mg by mouth daily.    [name] losartan (COZAAR) 100 MG tablet Take 100 mg by mouth daily.     [name] losartan (COZAAR) 100 MG tablet Take by mouth.    . Multiple Vitamin (MULTIVITAMIN) capsule Take by mouth daily.     . nitroGLYCERIN (NITROSTAT) 0.4 MG SL tablet Place under the tongue.    [name] omeprazole (PRILOSEC) 20 MG capsule Take by mouth daily.     . rosuvastatin (CRESTOR) 20 MG tablet Take by  mouth daily.     [name] zolpidem (AMBIEN) 10 MG tablet Take by mouth at bedtime as needed.      No facility-administered medications prior to visit.     Review of Systems CNS: No confusion or sedation Cardiac: No angina or palpitations GI: No abdominal pain or constipation Constitutional: No nausea vomiting fevers or chills  Objective:  BP (!) 156/89   Pulse 91   Temp 98.4 F (36.9 C)   Resp 16   Ht 5\' 3"  (1.6 m)   Wt 177 lb (80.3 kg)   SpO2 98%   BMI 31.35 kg/m    BP Readings from Last 3 Encounters:  07/03/18 (!) 156/89  05/14/18 (!) 170/82  03/27/18 (!) 147/94     Wt Readings from Last 3 Encounters:  07/03/18 177 lb (80.3 kg)  05/14/18 177 lb (80.3 kg)  03/27/18 177 lb (80.3 kg)     Physical Exam Pt is alert and oriented PERRL EOMI HEART IS RRR no murmur or rub LCTA no wheezing or rales MUSCULOSKELETAL reveals some paraspinous muscle tenderness but no overt trigger points.  Her lower extremity muscle tone and bulk is at baseline.  Labs  No results found for: HGBA1C Lab Results  Component Value Date   CREATININE 1.09 04/19/2014    -------------------------------------------------------------------------------------------------------------------- Lab Results  Component Value Date  WBC 13.2 (H) 04/19/2014   HGB 15.3 04/19/2014   HCT 46.4 04/19/2014   PLT 289 04/19/2014   GLUCOSE 131 (H) 04/19/2014   ALT 71 (H) 04/19/2014   AST 26 04/19/2014   NA 136 04/19/2014   K 3.5 04/19/2014   CL 102 04/19/2014   CREATININE 1.09 04/19/2014   BUN 21 (H) 04/19/2014   CO2 26 04/19/2014    --------------------------------------------------------------------------------------------------------------------- Dg C-arm 1-60 Min-no Report  Result Date: 07/03/2018 Fluoroscopy was utilized by the requesting physician.  No radiographic interpretation.     Assessment & Plan:   Lauralei was seen today for procedure.  Diagnoses and all orders for this visit:  Chronic  pain syndrome  Facet arthritis of lumbar region  Lumbosacral spondylosis without myelopathy  Chronic left-sided low back pain without sciatica  Other orders -     sodium chloride flush (NS) 0.9 % injection 10 mL -     ropivacaine (PF) 2 mg/mL (0.2%) (NAROPIN) injection 10 mL -     midazolam (VERSED) 5 MG/5ML injection 5 mg -     lidocaine (PF) (XYLOCAINE) 1 % injection 5 mL -     lactated ringers infusion 1,000 mL -     fentaNYL (SUBLIMAZE) injection 100 mcg -     dexamethasone (DECADRON) injection 10 mg -     HYDROcodone-acetaminophen (NORCO/VICODIN) 5-325 MG tablet; Take 1 tablet by mouth every 6 (six) hours as needed for up to 10 days for moderate pain or severe pain.        ----------------------------------------------------------------------------------------------------------------------  Problem List Items Addressed This Visit    None    Visit Diagnoses    Chronic pain syndrome    -  Primary   Facet arthritis of lumbar region       Relevant Medications   fentaNYL (SUBLIMAZE) injection 100 mcg (Completed)   dexamethasone (DECADRON) injection 10 mg (Completed)   HYDROcodone-acetaminophen (NORCO/VICODIN) 5-325 MG tablet   Lumbosacral spondylosis without myelopathy       Relevant Medications   fentaNYL (SUBLIMAZE) injection 100 mcg (Completed)   dexamethasone (DECADRON) injection 10 mg (Completed)   HYDROcodone-acetaminophen (NORCO/VICODIN) 5-325 MG tablet   Chronic left-sided low back pain without sciatica       Relevant Medications   fentaNYL (SUBLIMAZE) injection 100 mcg (Completed)   dexamethasone (DECADRON) injection 10 mg (Completed)   HYDROcodone-acetaminophen (NORCO/VICODIN) 5-325 MG tablet        ----------------------------------------------------------------------------------------------------------------------  1. Chronic pain syndrome   2. Facet arthritis of lumbar region We will proceed with lumbar radiofrequency ablation as mentioned today.   This will be for the left lower lumbar facets.  We have gone over the risks and benefits of the procedure with her in full detail and all questions have been answered.  I am also going to send her home with Vicodin for breakthrough pain at 5 mg tablets 1 p.o. 3 times daily for breakthrough pain.  We will see her back in approximately 1 to 2 months for reevaluation and she is been instructed to contact us directly if there are any questions regarding her care.  3. Lumbosacral spondylosis without myelopathy As above.  4. Chronic left-sided low back pain without sciatica     ----------------------------------------------------------------------------------------------------------------------  I am having Elfredia Nevins start on HYDROcodone-acetaminophen. I am also having her maintain her amLODipine, aspirin EC, atenolol, chlorthalidone, losartan, multivitamin, nitroGLYCERIN, omeprazole, rosuvastatin, zolpidem, losartan, diphenhydramine-acetaminophen, carvedilol, isosorbide mononitrate, and ezetimibe. We administered ropivacaine (PF) 2 mg/mL (0.2%), midazolam, lidocaine (PF), lactated ringers, fentaNYL, and  dexamethasone.   Meds ordered this encounter  Medications  . sodium chloride flush (NS) 0.9 % injection 10 mL  . ropivacaine (PF) 2 mg/mL (0.2%) (NAROPIN) injection 10 mL  . midazolam (VERSED) 5 MG/5ML injection 5 mg  . lidocaine (PF) (XYLOCAINE) 1 % injection 5 mL  . lactated ringers infusion 1,000 mL  . fentaNYL (SUBLIMAZE) injection 100 mcg  . dexamethasone (DECADRON) injection 10 mg  . HYDROcodone-acetaminophen (NORCO/VICODIN) 5-325 MG tablet    Sig: Take 1 tablet by mouth every 6 (six) hours as needed for up to 10 days for moderate pain or severe pain.    Dispense:  15 tablet    Refill:  0   Patient's Medications  New Prescriptions   HYDROCODONE-ACETAMINOPHEN (NORCO/VICODIN) 5-325 MG TABLET    Take 1 tablet by mouth every 6 (six) hours as needed for up to 10 days for moderate pain  or severe pain.  Previous Medications   AMLODIPINE (NORVASC) 5 MG TABLET    Take by mouth daily.    ASPIRIN EC 81 MG TABLET    Take by mouth daily.    ATENOLOL (TENORMIN) 50 MG TABLET    Take by mouth 2 (two) times daily.    CARVEDILOL (COREG) 6.25 MG TABLET    Take 6.25 mg by mouth daily.   CHLORTHALIDONE (HYGROTON) 25 MG TABLET    TAKE 1/2 TABLET EVERY DAY   DIPHENHYDRAMINE-ACETAMINOPHEN (TYLENOL PM EXTRA STRENGTH) 25-500 MG TABS TABLET    Take by mouth.   EZETIMIBE (ZETIA) 10 MG TABLET    TK 1 T PO QD   ISOSORBIDE MONONITRATE (IMDUR) 30 MG 24 HR TABLET    Take 30 mg by mouth daily.   LOSARTAN (COZAAR) 100 MG TABLET    Take 100 mg by mouth daily.    LOSARTAN (COZAAR) 100 MG TABLET    Take by mouth.   MULTIPLE VITAMIN (MULTIVITAMIN) CAPSULE    Take by mouth daily.    NITROGLYCERIN (NITROSTAT) 0.4 MG SL TABLET    Place under the tongue.   OMEPRAZOLE (PRILOSEC) 20 MG CAPSULE    Take by mouth daily.    ROSUVASTATIN (CRESTOR) 20 MG TABLET    Take by mouth daily.    ZOLPIDEM (AMBIEN) 10 MG TABLET    Take by mouth at bedtime as needed.   Modified Medications   No medications on file  Discontinued Medications   No medications on file   ----------------------------------------------------------------------------------------------------------------------  Follow-up: Return for evaluation.  Procedure note. After informed consent was obtained with the risks and benefits reviewed the patient chose to pursue radiofrequency ablation of the left lumbar facet joints in the lumbar region.  She was taken to the fluoroscopy suite and placed in the prone position with an IV in place and we titrated 1.5 mg Versed for sedation with a one half cc of fentanyl as documented with vital signs stable throughout the procedure.  Her back was prepped in the usual fashion with a cleanse x3 and I identified areas overlying the L3-4, L4-5, L5-S1 facets on the left side. I advanced a 22-gauge radiofrequency 3-1/2 inch length  RF needles in the usual fashion. These were advanced with the needle tip to lie at the [place] eye portion of the " Scotty dog". This was done after lidocaine 1.5% injected to the subcutaneous and fascia in each of the aforementioned levels. Needle confirmation was both in the AP and oblique and lateral view. Needle tip depth and positioning was confirmed. We proceeded with sensory interrogation  which yielded appropriate reproduction of pain between 0.2 and 0.5 Volts. A normal motor stimulus was performed at each of the aforementioned levels at 2 times the sensory stimulus with no evidence of active stimulation to the lower extremity based on clinical findings and based on patient history and response.  Needle tip placement was confirmed with lateral view.  I then injected 2 cc of ropivacaine 0.2% mixed with 3 mg of Decadron at each level and then a radiofrequency burn at [facility]  for 1 minutes was performed and tolerated well.  The needles were removed and the patient was convalesced discharged home in stable condition for follow-up as mentioned.  Dr. Vashti Hey, M.D.   Molli Barrows, MD

## 2018-07-04 ENCOUNTER — Telehealth: Payer: Self-pay

## 2018-07-04 NOTE — Telephone Encounter (Signed)
Post procedure phone call.  Patient states she is doing great 

## 2018-09-08 HISTORY — PX: BACK SURGERY: SHX140

## 2018-09-20 DIAGNOSIS — M5416 Radiculopathy, lumbar region: Secondary | ICD-10-CM | POA: Insufficient documentation

## 2018-09-20 DIAGNOSIS — R471 Dysarthria and anarthria: Secondary | ICD-10-CM | POA: Insufficient documentation

## 2018-09-20 DIAGNOSIS — K219 Gastro-esophageal reflux disease without esophagitis: Secondary | ICD-10-CM | POA: Insufficient documentation

## 2018-11-27 ENCOUNTER — Other Ambulatory Visit: Payer: Self-pay | Admitting: Internal Medicine

## 2018-11-27 DIAGNOSIS — Z1231 Encounter for screening mammogram for malignant neoplasm of breast: Secondary | ICD-10-CM

## 2019-01-22 ENCOUNTER — Other Ambulatory Visit: Payer: Self-pay

## 2019-01-22 ENCOUNTER — Ambulatory Visit
Admission: RE | Admit: 2019-01-22 | Discharge: 2019-01-22 | Disposition: A | Discharge status: Home / Self Care | Payer: Medicare Other | Source: Ambulatory Visit | Attending: Internal Medicine | Admitting: Internal Medicine

## 2019-01-22 DIAGNOSIS — Z1231 Encounter for screening mammogram for malignant neoplasm of breast: Secondary | ICD-10-CM | POA: Insufficient documentation

## 2019-10-17 ENCOUNTER — Encounter: Payer: Self-pay | Admitting: *Deleted

## 2019-10-17 ENCOUNTER — Encounter: Payer: Medicare Other | Attending: Internal Medicine | Admitting: *Deleted

## 2019-10-17 ENCOUNTER — Other Ambulatory Visit: Payer: Self-pay

## 2019-10-17 VITALS — BP 110/76 | Ht 63.0 in | Wt 164.9 lb

## 2019-10-17 DIAGNOSIS — N183 Chronic kidney disease, stage 3 unspecified: Secondary | ICD-10-CM | POA: Insufficient documentation

## 2019-10-17 DIAGNOSIS — Z713 Dietary counseling and surveillance: Secondary | ICD-10-CM | POA: Insufficient documentation

## 2019-10-17 DIAGNOSIS — E1122 Type 2 diabetes mellitus with diabetic chronic kidney disease: Secondary | ICD-10-CM | POA: Diagnosis not present

## 2019-10-17 DIAGNOSIS — E119 Type 2 diabetes mellitus without complications: Secondary | ICD-10-CM

## 2019-10-17 NOTE — Patient Instructions (Addendum)
Exercise: Begin walking for  10-15  minutes  3 days a week  and gradually increase as tolerated  Eat 3 meals day,  1-2  snacks a day Space meals 4-6 hours apart Don't skip meals Avoid sugar sweetened drinks (tea, juices) Try diet cranberry juice Limit desserts/sweets Complete 3 Day Food Record and bring to next appt  Return for appointment on:  Wednesday November 13, 2019 at 11:00 am with Freda Munro (nurse)

## 2019-10-17 NOTE — Progress Notes (Signed)
Diabetes Self-Management Education  Visit Type: First/Initial  Appt. Start Time: 1325 Appt. End Time: 1440  10/17/2019  [name], identified by name and date of birth, is a 79 y.o. female with a diagnosis of Diabetes: Type 2.   ASSESSMENT  Blood pressure 110/76, height 5\' 3"  (1.6 m), weight 164 lb 14.4 oz (74.8 kg). Body mass index is 29.21 kg/m.  Diabetes Self-Management Education - 10/17/19 1517      Visit Information   Visit Type  First/Initial      Initial Visit   Diabetes Type  Type 2    Are you currently following a meal plan?  No    Are you taking your medications as prescribed?  Yes    Date Diagnosed  "recently" - A1C from 2019 was 6.9%      Health Coping   How would you rate your overall health?  Good      Psychosocial Assessment   Patient Belief/Attitude about Diabetes  Motivated to manage diabetes   "sad that I have it"   Self-care barriers  None    Self-management support  Doctor's office;Family    Patient Concerns  Nutrition/Meal planning;Glycemic Control;Monitoring;Weight Control    Special Needs  None    Preferred Learning Style  Visual;Auditory    Learning Readiness  Ready    How often do you need to have someone help you when you read instructions, pamphlets, or other written materials from your doctor or pharmacy?  1 - Never    What is the last grade level you completed in school?  1 year [employer]      Pre-Education Assessment   Patient understands the diabetes disease and treatment process.  Needs Instruction    Patient understands incorporating nutritional management into lifestyle.  Needs Instruction    Patient undertands incorporating physical activity into lifestyle.  Needs Instruction    Patient understands using medications safely.  Needs Instruction    Patient understands monitoring blood glucose, interpreting and using results  Needs Instruction    Patient understands prevention, detection, and treatment of acute complications.  Needs  Instruction    Patient understands prevention, detection, and treatment of chronic complications.  Needs Instruction    Patient understands how to develop strategies to address psychosocial issues.  Needs Instruction    Patient understands how to develop strategies to promote health/change behavior.  Needs Instruction      Complications   Last HgB A1C per patient/outside source  7.2 %   09/18/2019   How often do you check your blood sugar?  Patient declines   BG in the office today was 99 mg/dL at 2:25 pm - 2 hrs after snack.   Have you had a dilated eye exam in the past 12 months?  Yes    Have you had a dental exam in the past 12 months?  Yes    Are you checking your feet?  No      Dietary Intake   Breakfast  egg and toast with occasional bacon; cereal and dab of milk; oatmeal    Snack (morning)  she reports 2-3 snacks/day - fruit, cookie, ice cream    Lunch  skips or eats fruit (apple, banana)    Dinner  salmon and chicken; potatoes, green beans, peas, beans, corn, cabbage, baked spaghetti, salad with lettuce, carrots, tomatoes, cuccumbers, low fat Ranch dressing    Snack (evening)  ice cream at bedtime    Beverage(s)  water, unsweetened tea, sweet tea when eating  out, diet soda, cranberry juice      Exercise   Exercise Type  ADL's      Patient Education   Previous Diabetes Education  No    Disease state   Definition of diabetes, type 1 and 2, and the diagnosis of diabetes;Factors that contribute to the development of diabetes    Nutrition management   Role of diet in the treatment of diabetes and the relationship between the three main macronutrients and blood glucose level;Food label reading, portion sizes and measuring food.    Physical activity and exercise   Role of exercise on diabetes management, blood pressure control and cardiac health.    Monitoring  Taught/discussed recording of test results and interpretation of SMBG.;Identified appropriate SMBG and/or A1C goals.     Chronic complications  Relationship between chronic complications and blood glucose control    Psychosocial adjustment  Identified and addressed patients feelings and concerns about diabetes      Individualized Goals (developed by patient)   Reducing Risk  Other (comment)   improve blood sugars, prevent diabetes complications, lose weight     Outcomes   Expected Outcomes  Demonstrated interest in learning. Expect positive outcomes    Future DMSE  4-6 wks       Individualized Plan for Diabetes Self-Management Training:   Learning Objective:  Patient will have a greater understanding of diabetes self-management. Patient education plan is to attend individual and/or group sessions per assessed needs and concerns.   Plan:   Patient Instructions  Exercise: Begin walking for  10-15  minutes  3 days a week  and gradually increase as tolerated Eat 3 meals day,  1-2  snacks a day Space meals 4-6 hours apart Don't skip meals Avoid sugar sweetened drinks (tea, juices) Try diet cranberry juice Limit desserts/sweets Complete 3 Day Food Record and bring to next appt Return for appointment on:  Wednesday November 13, 2019 at 11:00 am with Freda Munro (nurse)  Expected Outcomes:  Demonstrated interest in learning. Expect positive outcomes  Education material provided: General Meal Planning Guidelines Simple Meal Plan 3 Day Food Record  If problems or questions, patient to contact team via:  Johny Drilling, RN, CCM, CDE [phone]  Future DSME appointment: 4-6 wks  The patient is not interested in sitting for Diabetes classes but she agreed to attend the 2 Hour Refresher Program. Her next appointment is scheduled for November 13, 2019 with this nurse. She didn't want to see a dietitian at this time.

## 2019-11-13 ENCOUNTER — Other Ambulatory Visit: Payer: Self-pay

## 2019-11-13 ENCOUNTER — Encounter: Payer: Self-pay | Admitting: *Deleted

## 2019-11-13 ENCOUNTER — Encounter: Payer: Medicare Other | Attending: Internal Medicine | Admitting: *Deleted

## 2019-11-13 VITALS — BP 136/80 | Wt 164.3 lb

## 2019-11-13 DIAGNOSIS — N183 Chronic kidney disease, stage 3 unspecified: Secondary | ICD-10-CM | POA: Insufficient documentation

## 2019-11-13 DIAGNOSIS — Z713 Dietary counseling and surveillance: Secondary | ICD-10-CM | POA: Insufficient documentation

## 2019-11-13 DIAGNOSIS — E119 Type 2 diabetes mellitus without complications: Secondary | ICD-10-CM

## 2019-11-13 DIAGNOSIS — E1122 Type 2 diabetes mellitus with diabetic chronic kidney disease: Secondary | ICD-10-CM | POA: Insufficient documentation

## 2019-11-13 NOTE — Patient Instructions (Addendum)
Exercise: Begin walking for  10-15  minutes  3 days a week and gradually increase as tolerated  Eat 3 meals day,  1-2  snacks a day Space meals 4-6 hours apart Include 1 serving of protein with meals and when having fruit for a snack Continue to limit desserts/sweets Continue to avoid sugar sweetened drinks (tea, juices)  Call back if you have any questions.

## 2019-11-13 NOTE — Progress Notes (Signed)
Diabetes Self-Management Education  Visit Type: Follow-up  Appt. Start Time: 1105 Appt. End Time: 1215  11/13/2019  [name], identified by name and date of birth, is a 79 y.o. female with a diagnosis of Diabetes:  Type 2.   ASSESSMENT  Blood pressure 136/80, weight 164 lb 4.8 oz (74.5 kg). Body mass index is 29.1 kg/m.  Diabetes Self-Management Education - 11/13/19 1233      Visit Information   Visit Type  Follow-up      Complications   How often do you check your blood sugar?  Patient declines   Blood sugar  in the office today was 104 mg/dL at 12:05 pm - 4 hrs pp.   Have you had a dilated eye exam in the past 12 months?  Yes    Have you had a dental exam in the past 12 months?  Yes    Are you checking your feet?  Yes    How many days per week are you checking your feet?  7      Dietary Intake   Breakfast  has 3 meals and 2 snacks/day - (see 3 Day Food Record) doesn't always include protein with breakfast    Snack (afternoon)  fruit for afternoon snack - doesn't have protein with fruit    Beverage(s)  has switched to diet cranberry juice, occasionally has sweet tea when out to eat - will try 1/2 and 1/2      Exercise   Exercise Type  ADL's      Patient Education   Disease state   Definition of diabetes, type 1 and 2, and the diagnosis of diabetes    Nutrition management   Role of diet in the treatment of diabetes and the relationship between the three main macronutrients and blood glucose level;Food label reading, portion sizes and measuring food.;Meal options for control of blood glucose level and chronic complications.    Physical activity and exercise   Role of exercise on diabetes management, blood pressure control and cardiac health.    Monitoring  Taught/discussed recording of test results and interpretation of SMBG.;Identified appropriate SMBG and/or A1C goals.;Daily foot exams    Chronic complications  Relationship between chronic complications and blood  glucose control    Psychosocial adjustment  Role of stress on diabetes;Identified and addressed patients feelings and concerns about diabetes      Individualized Goals (developed by patient)   Nutrition  Follow meal plan discussed    Physical Activity  Exercise 3-5 times per week      Post-Education Assessment   Patient understands the diabetes disease and treatment process.  Demonstrates understanding / competency    Patient understands incorporating nutritional management into lifestyle.  Demonstrates understanding / competency    Patient undertands incorporating physical activity into lifestyle.  Needs Review    Patient understands using medications safely.  Needs Review    Patient understands monitoring blood glucose, interpreting and using results  Demonstrates understanding / competency    Patient understands prevention, detection, and treatment of acute complications.  Needs Review    Patient understands prevention, detection, and treatment of chronic complications.  Demonstrates understanding / competency    Patient understands how to develop strategies to address psychosocial issues.  Demonstrates understanding / competency    Patient understands how to develop strategies to promote health/change behavior.  Demonstrates understanding / competency      Outcomes   Expected Outcomes  Demonstrated interest in learning. Expect positive outcomes  Future DMSE  PRN    Program Status  Completed      Subsequent Visit   Since your last visit have you continued or begun to take your medications as prescribed?  Yes    Since your last visit have you had your blood pressure checked?  No    Since your last visit have you experienced any weight changes?  No change    Since your last visit, are you checking your blood glucose at least once a day?  N/A       Individualized Plan for Diabetes Self-Management Training:   Learning Objective:  Patient will have a greater understanding of diabetes  self-management. Patient education plan is to attend individual and/or group sessions per assessed needs and concerns.   Plan:   Patient Instructions  Exercise: Begin walking for  10-15  minutes  3 days a week and gradually increase as tolerated Eat 3 meals day,  1-2  snacks a day Space meals 4-6 hours apart Include 1 serving of protein with meals and when having fruit for a snack Continue to limit desserts/sweets Continue to avoid sugar sweetened drinks (tea, juices) Call back if you have any questions.  Expected Outcomes:  Demonstrated interest in learning. Expect positive outcomes  Education material provided:  Planning a Balanced Meal  If problems or questions, patient to contact team via:  Cassidy Drilling, RN, [place], [place] [phone]  Future DSME appointment: PRN

## 2019-12-17 ENCOUNTER — Other Ambulatory Visit: Payer: Self-pay | Admitting: Internal Medicine

## 2019-12-17 DIAGNOSIS — Z1231 Encounter for screening mammogram for malignant neoplasm of breast: Secondary | ICD-10-CM

## 2020-01-24 ENCOUNTER — Ambulatory Visit
Admission: RE | Admit: 2020-01-24 | Discharge: 2020-01-24 | Disposition: A | Discharge status: Home / Self Care | Payer: Medicare Other | Source: Ambulatory Visit | Attending: Internal Medicine | Admitting: Internal Medicine

## 2020-01-24 DIAGNOSIS — Z1231 Encounter for screening mammogram for malignant neoplasm of breast: Secondary | ICD-10-CM | POA: Diagnosis not present

## 2020-06-25 DIAGNOSIS — F325 Major depressive disorder, single episode, in full remission: Secondary | ICD-10-CM | POA: Insufficient documentation

## 2020-12-13 ENCOUNTER — Other Ambulatory Visit: Payer: Self-pay

## 2020-12-13 ENCOUNTER — Emergency Department
Admission: EM | Admit: 2020-12-13 | Discharge: 2020-12-13 | Disposition: A | Discharge status: Home / Self Care | Payer: Medicare Other | Attending: Emergency Medicine | Admitting: Emergency Medicine

## 2020-12-13 ENCOUNTER — Emergency Department: Payer: Medicare Other

## 2020-12-13 DIAGNOSIS — M25511 Pain in right shoulder: Secondary | ICD-10-CM | POA: Insufficient documentation

## 2020-12-13 DIAGNOSIS — Z7982 Long term (current) use of aspirin: Secondary | ICD-10-CM | POA: Insufficient documentation

## 2020-12-13 DIAGNOSIS — S39012A Strain of muscle, fascia and tendon of lower back, initial encounter: Secondary | ICD-10-CM | POA: Insufficient documentation

## 2020-12-13 DIAGNOSIS — W010XXA Fall on same level from slipping, tripping and stumbling without subsequent striking against object, initial encounter: Secondary | ICD-10-CM | POA: Insufficient documentation

## 2020-12-13 DIAGNOSIS — E119 Type 2 diabetes mellitus without complications: Secondary | ICD-10-CM | POA: Diagnosis not present

## 2020-12-13 DIAGNOSIS — W19XXXA Unspecified fall, initial encounter: Secondary | ICD-10-CM

## 2020-12-13 DIAGNOSIS — I10 Essential (primary) hypertension: Secondary | ICD-10-CM | POA: Diagnosis not present

## 2020-12-13 DIAGNOSIS — Z79899 Other long term (current) drug therapy: Secondary | ICD-10-CM | POA: Insufficient documentation

## 2020-12-13 DIAGNOSIS — S3992XA Unspecified injury of lower back, initial encounter: Secondary | ICD-10-CM | POA: Diagnosis present

## 2020-12-13 MED ORDER — NAPROXEN 500 MG PO TABS
500.0000 mg | ORAL_TABLET | Freq: Once | ORAL | Status: AC
  Administered 2020-12-13: 500 mg via ORAL
  Filled 2020-12-13: qty 1

## 2020-12-13 MED ORDER — NAPROXEN SODIUM 550 MG PO TABS: 550.0000 mg | ORAL_TABLET | Freq: Two times a day (BID) | ORAL | 0 refills | Status: AC

## 2020-12-13 NOTE — ED Triage Notes (Signed)
Pt arrives to ER after tripping on a rug and falling on floor. C/o R upper arm pain and back pain. A&O, ambulatory. Denies hitting head. Denies LOC.

## 2020-12-13 NOTE — ED Provider Notes (Signed)
[facility] Emergency Department Provider Note  ____________________________________________   None    (approximate)  I have reviewed the triage vital signs and the nursing notes.   HISTORY  Chief Complaint Fall and Arm Pain  HPI Cassidy Hernandez is a 80 y.o. female presents to the ED from home for evaluation of injury sustained following mechanical fall.  Patient describes tripping over her rug, and falling to the floor.  She describes falling primarily onto her right side, but denies any head injury or LOC.  She complains primarily of pain to the right upper arm and shoulder, as well as some right-sided low back pain.  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  She denies any bladder or bowel incontinence, foot drop, or saddle anesthesia.  She also denies any new upper extremity paresthesias.  She does report pain with attempted range of motion to the right upper extremity.   Past Medical History:  Diagnosis Date  . 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 Degenerative arthritis   . Diabetes mellitus without complication ([place])   . GERD (gastroesophageal reflux disease)   . H/O: hysterectomy   . Hypertension   . Kidney stone   . Subclavian steal syndrome     There are no problems to display for this patient.   Past Surgical History:  Procedure Laterality Date  . ABDOMINAL HYSTERECTOMY    . AUGMENTATION MAMMAPLASTY Bilateral 1980  . 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 CHOLECYSTECTOMY    . COLONOSCOPY N/A 02/11/2016   Procedure: COLONOSCOPY;  Surgeon: Manya Silvas, MD;  Location: [facility] ENDOSCOPY;  Service: Endoscopy;  Laterality: N/A;    Prior to Admission medications   Medication Sig Start Date End Date Taking? Authorizing Provider  naproxen sodium (ANAPROX) 550 MG tablet Take 1 tablet (550 mg total) by mouth 2 (two) times daily with a meal for 10 days. 12/13/20 12/23/20 Yes  Shuna Tabor, Dannielle Karvonen, PA-C  acetaminophen (TYLENOL) 325 MG tablet Take 650 mg by mouth every 8 (eight) hours as needed.    [provider]  amLODipine (NORVASC) 5 MG tablet Take 5 mg by mouth daily.  12/04/14   [provider]  aspirin EC 81 MG tablet Take 81 mg by mouth daily.     [provider]  carvedilol (COREG) 3.125 MG tablet Take 3.125 mg by mouth 2 (two) times daily. 10/05/19   [provider]  chlorthalidone (HYGROTON) 25 MG tablet TAKE 1/2 TABLET EVERY DAY 09/23/14   [provider]  clindamycin (CLEOCIN T) 1 % external solution APPLY TO AFFECTED AREA TWICE A DAY 10/31/19   [provider]  doxycycline (VIBRAMYCIN) 100 MG capsule Take 100 mg by mouth 2 (two) times daily. 10/15/19   [provider]  ezetimibe (ZETIA) 10 MG tablet TK 1 T PO QD 06/23/18   [provider]  isosorbide mononitrate (IMDUR) 30 MG 24 hr tablet Take 30 mg by mouth daily.    [provider]  losartan (COZAAR) 100 MG tablet Take 100 mg by mouth daily.     [provider]  magnesium chloride (SLOW-MAG) 64 MG TBEC SR tablet Take 1 tablet by mouth daily. 1 or 2 tablets extra per week    [provider]  Multiple Vitamin (MULTIVITAMIN) capsule Take by mouth daily.     [provider]  nitroGLYCERIN (NITROSTAT) 0.4 MG SL tablet Place under the tongue.  04/13/18 09/24/28  [provider]  omeprazole (PRILOSEC) 20 MG capsule Take 20 mg by mouth daily.     [provider]  rosuvastatin (CRESTOR) 20 MG tablet Take 20 mg by mouth daily.  12/01/14   [provider]    Allergies Sulfa antibiotics  Family History  Problem Relation Age of Onset  . Heart attack Mother   . Heart disease Mother   . Heart attack Father   . Heart disease Father   . Breast cancer Neg Hx     Social History Social History   Tobacco Use  . Smoking status: Never Smoker  . Smokeless tobacco: Never Used  Substance  Use Topics  . Alcohol use: Never    Review of Systems  Constitutional: No fever/chills Eyes: No visual changes. ENT: No sore throat. Cardiovascular: Denies chest pain. Respiratory: Denies shortness of breath. Gastrointestinal: No abdominal pain.  No nausea, no vomiting.  No diarrhea.  No constipation. Genitourinary: Negative for dysuria. Musculoskeletal: Positive for back and right upper arm pain. Skin: Negative for rash. Neurological: Negative for headaches, focal weakness or numbness. ____________________________________________   PHYSICAL EXAM:  VITAL SIGNS: ED Triage Vitals [12/13/20 1624]  Enc Vitals Group     BP (!) 120/97     Pulse Rate 95     Resp 18     Temp 97.7 F (36.5 C)     Temp Source Oral     SpO2 98 %     Weight 165 lb (74.8 kg)     Height 5\' 3"  (1.6 m)     Head Circumference      Peak Flow      Pain Score 4     Pain Loc      Pain Edu?      Excl. in [place]?     Constitutional: Alert and oriented. Well appearing and in no acute distress. Eyes: Conjunctivae are normal. PERRL. EOMI. Head: Atraumatic. Nose: No congestion/rhinnorhea. Mouth/Throat: Mucous membranes are moist.  Oropharynx non-erythematous. Neck: No stridor. No cervical spine tenderness to palpation. Cardiovascular: Normal rate, regular rhythm. Grossly normal heart sounds.  Good peripheral circulation. Respiratory: Normal respiratory effort.  No retractions. Lungs CTAB. Gastrointestinal: Soft and nontender. No distention. No abdominal bruits. No CVA tenderness. Musculoskeletal: Normal spinal alignment without midline tenderness, spasm, vomiting, or step-off.  Patient transitions from sit to stand without assistance.  Right shoulder without obvious deformity, dislocation, or sulcus sign.  Patient is Limited in her shoulder range.  Full active range of motion of the elbow with normal pronation supination of the forearm.  Normal composite fist bilaterally.  No lower extremity tenderness nor edema.   No joint effusions. Neurologic: Cranial nerves II through XII grossly intact.  No cerebellar ataxia suspected.  Normal speech and language. No gross focal neurologic deficits are appreciated. No gait instability. Skin:  Skin is warm, dry and intact. No rash noted. Psychiatric: Mood and affect are normal. Speech and behavior are normal.  ____________________________________________   LABS (all labs ordered are listed, but only abnormal results are displayed)  Labs Reviewed - No data to display ____________________________________________  EKG  ____________________________________________  RADIOLOGY I, Melvenia Needles, personally viewed and evaluated these images (plain radiographs) as part of my medical decision making, as well as reviewing the written report by the radiologist.  ED MD interpretation:  Agree with reports  Official radiology report(s): DG Shoulder Right  Result Date: 12/13/2020 CLINICAL DATA:  Trip and fall with right shoulder pain, initial encounter EXAM:  RIGHT SHOULDER - 2+ VIEW COMPARISON:  None. FINDINGS: Degenerative changes of the acromioclavicular joint are noted. No acute fracture or dislocation is seen. No soft tissue abnormality is noted. The underlying bony thorax appears within normal limits. IMPRESSION: Degenerative change without acute abnormality. Electronically Signed   By: Inez Catalina M.D.   On: 12/13/2020 17:27   CT Lumbar Spine Wo Contrast  Result Date: 12/13/2020 CLINICAL DATA:  Low back pain and trauma.  Fall. EXAM: CT LUMBAR SPINE WITHOUT CONTRAST TECHNIQUE: Multidetector CT imaging of the lumbar spine was performed without intravenous contrast administration. Multiplanar CT image reconstructions were also generated. COMPARISON:  None. FINDINGS: Segmentation: Standard Alignment: Grade 1 anterolisthesis at L4-5 Vertebrae: L4-5 PLIF without hardware abnormality. Paraspinal and other soft tissues: Calcific aortic atherosclerosis. Status post  cholecystectomy. Hemorrhagic cyst of the right kidney is unchanged compared to 06/16/2015 and 06/02/2015 Disc levels: L1-2: Unremarkable. L2-3: Small disc bulge.  No spinal canal stenosis. L3-4: Small disc bulge and facet hypertrophy. No spinal canal stenosis. L4-5: PLIF with mild left foraminal narrowing. No spinal canal stenosis. Remote left L4 laminectomy. L5-S1: Mild facet hypertrophy.  Otherwise unremarkable. IMPRESSION: 1. No acute abnormality of the lumbar spine. 2. L4-5 PLIF with mild left foraminal narrowing. Aortic Atherosclerosis (ICD10-I70.0). Electronically Signed   By: Ulyses Jarred M.D.   On: 12/13/2020 19:28   CT Shoulder Right Wo Contrast  Result Date: 12/13/2020 CLINICAL DATA:  Recent fall with shoulder pain and negative x-rays, initial encounter EXAM: CT OF THE UPPER RIGHT EXTREMITY WITHOUT CONTRAST TECHNIQUE: Multidetector CT imaging of the upper right extremity was performed according to the standard protocol. COMPARISON:  Plain films from earlier in the same day. FINDINGS: Bones/Joint/Cartilage Proximal humerus is well visualized and within normal limits. The humeral head is well seated within the glenoid no acute fracture or dislocation is noted. The scapula is unremarkable. Visualized portions of the clavicle are within normal limits. Some degenerative changes of the acromioclavicular and glenohumeral joints are seen. Underlying ribs are limited due to patient motion artifact. Ligaments Suboptimally assessed by CT. Muscles and Tendons Underlying musculature appears within normal limits. Rotator cuff as visualized appears within normal limits although sensitivity is low on CT. Soft tissues Surrounding soft tissue structures are within normal limits. No focal hematoma is seen. Labral injury cannot be evaluated on this exam. IMPRESSION: Mild degenerative change without acute abnormality. Electronically Signed   By: Inez Catalina M.D.   On: 12/13/2020 19:44   DG Humerus Right  Result Date:  12/13/2020 CLINICAL DATA:  Recent fall with right arm pain, initial encounter EXAM: RIGHT HUMERUS - 2+ VIEW COMPARISON:  None. FINDINGS: Degenerative changes of the acromioclavicular joint are noted. The humerus is well visualized and within normal limits. No fracture or dislocation is seen. Calcified right breast implant is noted. IMPRESSION: No acute bony abnormality noted. Electronically Signed   By: Inez Catalina M.D.   On: 12/13/2020 17:27    ____________________________________________   PROCEDURES  Procedure(s) performed (including Critical Care):  Procedures   ____________________________________________   INITIAL IMPRESSION / ASSESSMENT AND PLAN / ED COURSE  As part of my medical decision making, I reviewed the following data within the electronic MEDICAL RECORD NUMBER Notes from prior ED visits   Geriatric patient ED evaluation of injury sustained following a mechanical fall.  Patient presented with complaints of pain to the right shoulder and upper arm.  She also reported some right-sided low back pain.  She is status post PLIF the lumbar spine secondary to  DDD.  No red flags on exam.  Right upper extremity exam is limited secondary to the patient's pain response and guarding.  She is overall reassured by her normal x-rays and CT imaging.  She is discharged with an arm sling for comfort.  Prescription for naproxen.  She will follow-up with Ortho or primary provider for ongoing symptoms.  Return precautions have been discussed. ____________________________________________   FINAL CLINICAL IMPRESSION(S) / ED DIAGNOSES  Final diagnoses:  Fall in home, initial encounter  Acute pain of right shoulder  Strain of lumbar region, initial encounter     ED Discharge Orders         Ordered    naproxen sodium (ANAPROX) 550 MG tablet  2 times daily with meals        12/13/20 1949          *Please note:  TERE MCCONAUGHEY was evaluated in Emergency Department on 12/13/2020 for the symptoms  described in the history of present illness. She was evaluated in the context of the global COVID-19 pandemic, which necessitated consideration that the patient might be at risk for infection with the SARS-CoV-2 virus that causes COVID-19. Institutional protocols and algorithms that pertain to the evaluation of patients at risk for COVID-19 are in a state of rapid change based on information released by regulatory bodies including the CDC and federal and state organizations. These policies and algorithms were followed during the patient's care in the ED.  Some ED evaluations and interventions may be delayed as a result of limited staffing during and the pandemic.*   Note:  This document was prepared using Dragon voice recognition software and may include unintentional dictation errors.    Melvenia Needles, PA-C 12/13/20 Pecolia Ades    Duffy Bruce, MD 12/18/20 [phone]

## 2020-12-13 NOTE — ED Notes (Signed)
Pt states she fell Friday and c/o right arm and right lower back pain. Pt is AOX4, NAD, ambulatory to the room.

## 2020-12-13 NOTE — Discharge Instructions (Signed)
Your exam, x-rays, and CT imaging do not reveal any acute fracture or dislocation.  You do have symptoms consistent with a shoulder sprain, and may experience stiffness and decreased range of motion.  You should take the prescription naproxen as needed for pain relief.  Follow-up with your primary provider or orthopedics for ongoing shoulder pain symptoms.  Return to the ED if necessary.

## 2021-01-08 ENCOUNTER — Other Ambulatory Visit: Payer: Self-pay | Admitting: Orthopedic Surgery

## 2021-01-08 DIAGNOSIS — S46001A Unspecified injury of muscle(s) and tendon(s) of the rotator cuff of right shoulder, initial encounter: Secondary | ICD-10-CM

## 2021-01-08 DIAGNOSIS — M25311 Other instability, right shoulder: Secondary | ICD-10-CM

## 2021-01-22 ENCOUNTER — Other Ambulatory Visit: Payer: Self-pay

## 2021-01-22 ENCOUNTER — Ambulatory Visit
Admission: RE | Admit: 2021-01-22 | Discharge: 2021-01-22 | Disposition: A | Discharge status: Home / Self Care | Payer: Medicare Other | Source: Ambulatory Visit | Attending: Orthopedic Surgery | Admitting: Orthopedic Surgery

## 2021-01-22 DIAGNOSIS — M25311 Other instability, right shoulder: Secondary | ICD-10-CM | POA: Insufficient documentation

## 2021-01-22 DIAGNOSIS — S46001A Unspecified injury of muscle(s) and tendon(s) of the rotator cuff of right shoulder, initial encounter: Secondary | ICD-10-CM | POA: Diagnosis not present

## 2021-02-26 DIAGNOSIS — S46011A Strain of muscle(s) and tendon(s) of the rotator cuff of right shoulder, initial encounter: Secondary | ICD-10-CM | POA: Insufficient documentation

## 2021-03-05 ENCOUNTER — Other Ambulatory Visit: Payer: Self-pay | Admitting: Internal Medicine

## 2021-03-05 DIAGNOSIS — Z1231 Encounter for screening mammogram for malignant neoplasm of breast: Secondary | ICD-10-CM

## 2021-03-16 ENCOUNTER — Ambulatory Visit
Admission: RE | Admit: 2021-03-16 | Discharge: 2021-03-16 | Disposition: A | Discharge status: Home / Self Care | Payer: Medicare Other | Source: Ambulatory Visit | Attending: Internal Medicine | Admitting: Internal Medicine

## 2021-03-16 ENCOUNTER — Other Ambulatory Visit: Payer: Self-pay

## 2021-03-16 DIAGNOSIS — Z1231 Encounter for screening mammogram for malignant neoplasm of breast: Secondary | ICD-10-CM | POA: Insufficient documentation

## 2021-05-28 ENCOUNTER — Other Ambulatory Visit: Payer: Self-pay | Admitting: Surgery

## 2021-06-03 ENCOUNTER — Other Ambulatory Visit: Payer: Self-pay

## 2021-06-03 ENCOUNTER — Encounter
Admission: RE | Admit: 2021-06-03 | Discharge: 2021-06-03 | Disposition: A | Discharge status: Home / Self Care | Payer: Medicare Other | Source: Ambulatory Visit | Attending: Surgery | Admitting: Surgery

## 2021-06-03 VITALS — BP 149/94 | HR 75 | Resp 20 | Ht 63.0 in | Wt 178.1 lb

## 2021-06-03 DIAGNOSIS — Z01818 Encounter for other preprocedural examination: Secondary | ICD-10-CM | POA: Insufficient documentation

## 2021-06-03 DIAGNOSIS — Z01810 Encounter for preprocedural cardiovascular examination: Secondary | ICD-10-CM | POA: Diagnosis not present

## 2021-06-03 DIAGNOSIS — Z01812 Encounter for preprocedural laboratory examination: Secondary | ICD-10-CM

## 2021-06-03 HISTORY — DX: Malignant (primary) neoplasm, unspecified: C80.1

## 2021-06-03 HISTORY — DX: Personal history of urinary calculi: Z87.442

## 2021-06-03 HISTORY — DX: Atherosclerotic heart disease of native coronary artery without angina pectoris: I25.10

## 2021-06-03 LAB — URINALYSIS, ROUTINE W REFLEX MICROSCOPIC
Bacteria, UA: NONE SEEN
Bilirubin Urine: NEGATIVE
Glucose, UA: >=500 mg/dL — AB
Hgb urine dipstick: NEGATIVE
Ketones, ur: 5 mg/dL — AB
Leukocytes,Ua: NEGATIVE
Nitrite: NEGATIVE
Protein, ur: NEGATIVE mg/dL
Specific Gravity, Urine: 1.023 (ref 1.005–1.030)
pH: 6 (ref 5.0–8.0)

## 2021-06-03 LAB — COMPREHENSIVE METABOLIC PANEL
ALT: 21 U/L (ref 0–44)
AST: 21 U/L (ref 15–41)
Albumin: 3.9 g/dL (ref 3.5–5.0)
Alkaline Phosphatase: 44 U/L (ref 38–126)
Anion gap: 8 (ref 5–15)
BUN: 22 mg/dL (ref 8–23)
CO2: 27 mmol/L (ref 22–32)
Calcium: 9.2 mg/dL (ref 8.9–10.3)
Chloride: 104 mmol/L (ref 98–111)
Creatinine, Ser: 1.06 mg/dL — ABNORMAL HIGH (ref 0.44–1.00)
GFR, Estimated: 52 mL/min — ABNORMAL LOW (ref >60)
Glucose, Bld: 161 mg/dL — ABNORMAL HIGH (ref 70–99)
Potassium: 3.7 mmol/L (ref 3.5–5.1)
Sodium: 139 mmol/L (ref 135–145)
Total Bilirubin: 0.8 mg/dL (ref 0.3–1.2)
Total Protein: 7.1 g/dL (ref 6.5–8.1)

## 2021-06-03 LAB — CBC WITH DIFFERENTIAL/PLATELET
Abs Immature Granulocytes: 0.03 10*3/uL (ref 0.00–0.07)
Basophils Absolute: 0.1 10*3/uL (ref 0.0–0.1)
Basophils Relative: 1 %
Eosinophils Absolute: 0.3 10*3/uL (ref 0.0–0.5)
Eosinophils Relative: 3 %
HCT: 39.2 % (ref 36.0–46.0)
Hemoglobin: 13.5 g/dL (ref 12.0–15.0)
Immature Granulocytes: 0 %
Lymphocytes Relative: 39 %
Lymphs Abs: 3.4 10*3/uL (ref 0.7–4.0)
MCH: 31.4 pg (ref 26.0–34.0)
MCHC: 34.4 g/dL (ref 30.0–36.0)
MCV: 91.2 fL (ref 80.0–100.0)
Monocytes Absolute: 0.8 10*3/uL (ref 0.1–1.0)
Monocytes Relative: 9 %
Neutro Abs: 4.1 10*3/uL (ref 1.7–7.7)
Neutrophils Relative %: 48 %
Platelets: 323 10*3/uL (ref 150–400)
RBC: 4.3 MIL/uL (ref 3.87–5.11)
RDW: 12.6 % (ref 11.5–15.5)
WBC: 8.7 10*3/uL (ref 4.0–10.5)
nRBC: 0 % (ref 0.0–0.2)

## 2021-06-03 LAB — TYPE AND SCREEN
ABO/RH(D): A POS
Antibody Screen: NEGATIVE

## 2021-06-03 LAB — SURGICAL PCR SCREEN
MRSA, PCR: NEGATIVE
Staphylococcus aureus: NEGATIVE

## 2021-06-03 NOTE — Patient Instructions (Addendum)
Your procedure is scheduled on: Thursday June 10, 2021. Report to Day Surgery inside [facility] 2nd floor. To find out your arrival time please call [phone] between 1PM - 3PM on Wednesday June 09, 2021.  Remember: Instructions that are not followed completely may result in serious medical risk,  up to and including death, or upon the discretion of your surgeon and anesthesiologist your  surgery may need to be rescheduled.     _X__ 1. Do not eat food after midnight the night before your procedure.                 No chewing gum or hard candies. You may drink clear liquids up to 2 hours                 before you are scheduled to arrive for your surgery- DO not drink clear                 liquids within 2 hours of the start of your surgery.                 Clear Liquids include:  water, apple juice without pulp, clear Gatorade, G2 or                  Gatorade Zero (avoid Red/Purple/Blue), Black Coffee or Tea (Do not add                 anything to coffee or tea).  __X__2.   Complete the "Ensure Clear Pre-surgery Clear Carbohydrate Drink" provided to you, 2 hours before arrival. **If you are diabetic you will be provided with an alternative drink, Gatorade Zero or G2.  __X__3.  On the morning of surgery brush your teeth with toothpaste and water, you                may rinse your mouth with mouthwash if you wish.  Do not swallow any toothpaste of mouthwash.     _X__ 4.  No Alcohol for 24 hours before or after surgery.   _X__ 5.  Do Not Smoke or use e-cigarettes For 24 Hours Prior to Your Surgery.                 Do not use any chewable tobacco products for at least 6 hours prior to                 Surgery.  _X__  6.  Do not use any recreational drugs (marijuana, cocaine, heroin, ecstasy, MDMA or other)                For at least one week prior to your surgery.  Combination of these drugs with anesthesia                May have life threatening  results.  __X_ 7.  Notify your doctor if there is any change in your medical condition      (cold, fever, infections).     Do not wear jewelry, make-up, hairpins, clips or nail polish. Do not wear lotions, powders, or perfumes. You may wear deodorant. Do not shave 48 hours prior to surgery. Men may shave face and neck. Do not bring valuables to the hospital.    [facility] is not responsible for any belongings or valuables.  Contacts, dentures or bridgework may not be worn into surgery. Leave your suitcase in the car. After surgery it may be brought to your room. For patients  admitted to the hospital, discharge time is determined by your treatment team.   Patients discharged the day of surgery will not be allowed to drive home.   Make arrangements for someone to be with you for the first 24 hours of your Same Day Discharge.    Please read over the following fact sheets that you were given:   Total Joint Packet    __X__ Take these medicines the morning of surgery with A SIP OF WATER:    1. amLODipine (NORVASC) 5 MG   2. carvedilol (COREG) 3.125 MG  3. isosorbide mononitrate (IMDUR) 60 MG  4. omeprazole (PRILOSEC) 20 MG     ____ Fleet Enema (as directed)   __X__ Use CHG Soap (or wipes) as directed  __X__ Use Benzoyl Peroxide Gel as instructed  ____ Use inhalers on the day of surgery  ____ Stop metformin 2 days prior to surgery    ____ Take 1/2 of usual insulin dose the night before surgery. No insulin the morning          of surgery.   ____ Call your PCP, cardiologist, or Pulmonologist if taking aspirin and ask when to stop before your surgery.   __X__ One Week prior to surgery- Stop Anti-inflammatories such as Ibuprofen, Aleve, Advil, Motrin, meloxicam (MOBIC), diclofenac, etodolac, ketorolac, Toradol, Daypro, piroxicam, Goody's or BC powders. OK TO USE TYLENOL IF NEEDED   __X__ Stop supplements until after surgery.    ____ Bring C-Pap to the hospital.    If you  have any questions regarding your pre-procedure instructions,  Please call Pre-admit Testing at [phone].

## 2021-06-06 ENCOUNTER — Encounter: Payer: Self-pay | Admitting: Surgery

## 2021-06-06 NOTE — Progress Notes (Signed)
Perioperative Services  Pre-Admission/Anesthesia Testing Clinical Review  Date: 06/07/21  Patient Demographics:  Name: Cassidy Hernandez DOB:   10/23/1940 MRN:   [id number]  Planned Surgical Procedure(s):    Case: [id number] Date/Time: 06/10/21 1014   Procedure: REVERSE SHOULDER ARTHROPLASTY (Right: Shoulder)   Anesthesia type: Choice   Pre-op diagnosis:      Traumatic complete tear of right rotator cuff, initial encounter S46.011A     Rotator cuff tendinitis, right M75.81   Location: [facility] OR ROOM 03 / [place] [facility]   Surgeons: Corky Mull, MD   NOTE: Available PAT nursing documentation and vital signs have been reviewed. Clinical nursing staff has updated patient's PMH/PSHx, current medication list, and drug allergies/intolerances to ensure comprehensive history available to assist in medical decision making as it pertains to the aforementioned surgical procedure and anticipated anesthetic course. Extensive review of available clinical information performed. Cassidy Hernandez PMH and PSHx updated with any diagnoses/procedures that  may have been inadvertently omitted during her intake with the pre-admission testing department's nursing staff.  Clinical Discussion:  Cassidy Hernandez is a 80 y.o. female who is submitted for pre-surgical anesthesia review and clearance prior to her undergoing the above procedure. Patient has never been a smoker. 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  Patient is followed by cardiology Cassidy Ramp, MD). She was last seen in the cardiology clinic on 01/22/2021; notes reviewed.  At the time of her clinic visit, patient reported very mild exertional angina and shortness of breath.  Symptoms associated with walking, however patient medicates  this symptom by taking a SL NTG tablet which allows her to ambulate further. She denied any episodes of PND, orthopnea, palpitations, significant peripheral edema, vertiginous symptoms, or presyncope/syncope.  PMH significant for cardiovascular diagnoses.  Patient with known significant carotid artery stenosis and subclavian steal syndrome.  Carotid Doppler performed back in 01/2010 revealed 72% stenosis of the LICA and 13% stenosis of the RICA.  Patient has had serial monitoring of her carotid artery disease.  Last carotid Doppler performed on 07/14/2020 revealed severe atherosclerotic plaque burden with severe (>70%) stenosis within the BILATERAL ICAs.  Patient underwent diagnostic left heart catheterization on 12/24/2014 revealing a small LAD system.  There was diffuse chronic obstruction of the LAD.  Additionally, there was a 60% stenosis of the RCA.  LM and LCx normal.  LVEDP 18 mmHg.  LEFT subclavian artery was noted to have a 90% ostial stenosis. No intervention was deemed necessary by cardiology at that time; opted for medical management and ongoing ASCVD prevention.  Patient is on GDMT for her HTN and HLD diagnoses.  Blood pressure mildly elevated 130/60 on currently prescribed CCB, beta-blocker, diuretic, nitrate, and ARB therapies.  Patient is on a statin + ezetimibe therapies for her HLD diagnosis.  T2DM controlled on currently with diet lifestyle modification; last Hgb A1c was 7.0% when checked on 02/16/2021.  Functional capacity limited by arthritides and current angina/anginal equivalent symptoms; patient unable to achieve at least 4 METS of activity.  No changes were made to her medication regimen.  Patient to follow-up with outpatient cardiology in 6 months or sooner if needed.  Cassidy Hernandez is scheduled for an elective RIGHT REVERSE SHOULDER ARTHROPLASTY on 06/10/2021 with Dr. Milagros Evener, MD. Given patient's past medical history significant for cardiovascular diagnoses, presurgical cardiac  clearance  was sought by the PAT team. Per cardiology, "this patient is optimized for surgery and may proceed with the planned procedural course with a MODERATE risk of significant perioperative cardiovascular complications. For surgery, avoid major drops in blood pressure. Continue all medications". This patient is on daily antiplatelet therapy. She has been instructed on recommendations for continue her daily low-dose ASA throughout the perioperative period.  Patient denies previous perioperative complications with anesthesia in the past. In review of the available records, it is noted that patient underwent a general anesthetic course at [facility] ([facility]) in 09/2018 without documented complications.   Vitals with BMI 06/03/2021 12/13/2020 11/13/2019  Height 5\' 3"  5\' 3"  -  Weight 178 lbs 2 oz 165 lbs 164 lbs 5 oz  BMI 31.56 97.35 32.99  Systolic 242 683 419  Diastolic 94 97 80  Pulse 75 95 -    Providers/Specialists:   NOTE: Primary physician provider listed below. Patient may have been seen by APP or partner within same practice.   PROVIDER ROLE / SPECIALTY LAST OV  Cassidy Hernandez, Cassidy Cork, MD ORTHOPEDICS 06/03/2021  Cassidy Ruths, MD PRIMARY CARE PROVIDER 05/03/2021  Cassidy Aly, MD CARDIOLOGY 01/22/2021  Cassidy Hey, MD ANESTHESIOLOGY 07/03/2018   Allergies:  Sulfa antibiotics  Current Home Medications:   No current facility-administered medications for this encounter.    acetaminophen (TYLENOL) 325 MG tablet   amLODipine (NORVASC) 5 MG tablet   aspirin EC 81 MG tablet   carvedilol (COREG) 3.125 MG tablet   chlorthalidone (HYGROTON) 25 MG tablet   escitalopram (LEXAPRO) 10 MG tablet   ezetimibe (ZETIA) 10 MG tablet   isosorbide mononitrate (IMDUR) 60 MG 24 hr tablet   losartan (COZAAR) 100 MG tablet   magnesium chloride (SLOW-MAG) 64 MG TBEC SR tablet   Multiple Vitamin (MULTIVITAMIN) capsule   nitroGLYCERIN (NITROSTAT) 0.4 MG SL tablet   omeprazole  (PRILOSEC) 20 MG capsule   rosuvastatin (CRESTOR) 20 MG tablet   History:   Past Medical History:  Diagnosis Date   Anginal pain (HCC)    Aortic atherosclerosis    Cancer ([place])    Carotid artery stenosis 02/01/2010   a.) Doppler 62/22/9798: 92% LICA and 11% RICA. b.) Doppler 94/17/4081: >44% LICA and 81% RICA. c.) Doppler 10/25/16: >70% Bilater ICAs   Chronic bilateral low back pain with left-sided sciatica    CKD (chronic kidney disease), stage III (HCC)    Coronary artery disease    a.) LHC 12/24/2014 -> small LAD system; diffuse CTO of LAD. 60% RCA. LM and LCx with no sig disease. no intervention; med mgmt.   Degenerative arthritis    Diastolic dysfunction    a.) TTE 09/20/2018: EF 55%, G1DD, mild LAE and RVE, triv-mild panvalvular regurgitation.   Dysarthria    GERD (gastroesophageal reflux disease)    Hepatic steatosis    History of kidney stones    HLD (hyperlipidemia)    Hypertension    Kidney stone    Lumbar radiculopathy    Major depression in remission ([place])    PVD (peripheral vascular disease) ([place])    Renal cyst, right 06/02/2015   a.) CT 06/02/2015 --> 4.4 cm RIGHT renal mass; MRI recommended. b.) MRI 06/16/2015 --> 3.8 x 4.2 x 4.0 cm proteinaceous/hemmorhagic cyst.   Subclavian steal syndrome    a.) LHC 12/24/2014 --> tight eccentric 90% ostial stenosis with damping.   T2DM (type 2 diabetes mellitus) ([place])    Past Surgical History:  Procedure Laterality Date   ABDOMINAL  HYSTERECTOMY     AUGMENTATION MAMMAPLASTY Bilateral 1980   BACK SURGERY  09/2018   CARDIAC CATHETERIZATION Left 12/24/2014   Procedure: LEFT HEARD CATHETERIZATION; Location: [facility]; Surgeon: Cassidy Aly, MD   CHOLECYSTECTOMY     COLONOSCOPY N/A 02/11/2016   Procedure: COLONOSCOPY;  Surgeon: Manya Silvas, MD;  Location: [facility] ENDOSCOPY;  Service: Endoscopy;  Laterality: N/A;   Family History  Problem Relation Age of Onset   Heart attack Mother    Heart disease Mother    Heart attack  Father    Heart disease Father    Breast cancer Neg Hx    Social History   Tobacco Use   Smoking status: Never   Smokeless tobacco: Never  Vaping Use   Vaping Use: Never used  Substance Use Topics   Alcohol use: Never   Drug use: Never    Pertinent Clinical Results:  LABS: Labs reviewed: Acceptable for surgery.  No visits with results within 3 Day(s) from this visit.  Latest known visit with results is:  Hospital Outpatient Visit on 06/03/2021  Component Date Value Ref Range Status   WBC 06/03/2021 8.7  4.0 - 10.5 K/uL Final   RBC 06/03/2021 4.30  3.87 - 5.11 MIL/uL Final   Hemoglobin 06/03/2021 13.5  12.0 - 15.0 g/dL Final   HCT 06/03/2021 39.2  36.0 - 46.0 % Final   MCV 06/03/2021 91.2  80.0 - 100.0 fL Final   MCH 06/03/2021 31.4  26.0 - 34.0 pg Final   MCHC 06/03/2021 34.4  30.0 - 36.0 g/dL Final   RDW 06/03/2021 12.6  11.5 - 15.5 % Final   Platelets 06/03/2021 323  150 - 400 K/uL Final   nRBC 06/03/2021 0.0  0.0 - 0.2 % Final   Neutrophils Relative % 06/03/2021 48  % Final   Neutro Abs 06/03/2021 4.1  1.7 - 7.7 K/uL Final   Lymphocytes Relative 06/03/2021 39  % Final   Lymphs Abs 06/03/2021 3.4  0.7 - 4.0 K/uL Final   Monocytes Relative 06/03/2021 9  % Final   Monocytes Absolute 06/03/2021 0.8  0.1 - 1.0 K/uL Final   Eosinophils Relative 06/03/2021 3  % Final   Eosinophils Absolute 06/03/2021 0.3  0.0 - 0.5 K/uL Final   Basophils Relative 06/03/2021 1  % Final   Basophils Absolute 06/03/2021 0.1  0.0 - 0.1 K/uL Final   Immature Granulocytes 06/03/2021 0  % Final   Abs Immature Granulocytes 06/03/2021 0.03  0.00 - 0.07 K/uL Final   Performed at [facility], [address] [place],  [address]   Sodium 06/03/2021 139  135 - 145 mmol/L Final   Potassium 06/03/2021 3.7  3.5 - 5.1 mmol/L Final   Chloride 06/03/2021 104  98 - 111 mmol/L Final   CO2 06/03/2021 27  22 - 32 mmol/L Final   Glucose, Bld 06/03/2021 161 (A)  70 - 99 mg/dL Final   Glucose  reference range applies only to samples taken after fasting for at least 8 hours.   BUN 06/03/2021 22  8 - 23 mg/dL Final   Creatinine, Ser 06/03/2021 1.06 (A)  0.44 - 1.00 mg/dL Final   Calcium 06/03/2021 9.2  8.9 - 10.3 mg/dL Final   Total Protein 06/03/2021 7.1  6.5 - 8.1 g/dL Final   Albumin 06/03/2021 3.9  3.5 - 5.0 g/dL Final   AST 06/03/2021 21  15 - 41 U/L Final   ALT 06/03/2021 21  0 - 44 U/L Final   Alkaline  Phosphatase 06/03/2021 44  38 - 126 U/L Final   Total Bilirubin 06/03/2021 0.8  0.3 - 1.2 mg/dL Final   GFR, Estimated 06/03/2021 52 (A)  >60 mL/min Final   Comment: (NOTE) Calculated using the CKD-EPI Creatinine Equation (2021)    Anion gap 06/03/2021 8  5 - 15 Final   Performed at [facility], [address] [place], [place] [address]   Color, Urine 06/03/2021 STRAW (A)  YELLOW Final   APPearance 06/03/2021 CLEAR (A)  CLEAR Final   Specific Gravity, Urine 06/03/2021 1.023  1.005 - 1.030 Final   pH 06/03/2021 6.0  5.0 - 8.0 Final   Glucose, UA 06/03/2021 >=500 (A)  NEGATIVE mg/dL Final   Hgb urine dipstick 06/03/2021 NEGATIVE  NEGATIVE Final   Bilirubin Urine 06/03/2021 NEGATIVE  NEGATIVE Final   Ketones, ur 06/03/2021 5 (A)  NEGATIVE mg/dL Final   Protein, ur 06/03/2021 NEGATIVE  NEGATIVE mg/dL Final   Nitrite 06/03/2021 NEGATIVE  NEGATIVE Final   Leukocytes,Ua 06/03/2021 NEGATIVE  NEGATIVE Final   RBC / HPF 06/03/2021 0-5  0 - 5 RBC/hpf Final   WBC, UA 06/03/2021 0-5  0 - 5 WBC/hpf Final   Bacteria, UA 06/03/2021 NONE SEEN  NONE SEEN Final   Squamous Epithelial / LPF 06/03/2021 0-5  0 - 5 Final   Mucus 06/03/2021 PRESENT   Final   Performed at [facility], [address] [place], [place] [address]   ABO/RH(D) 06/03/2021 A POS   Final   Antibody Screen 06/03/2021 NEG   Final   Sample Expiration 06/03/2021 06/17/2021,2359   Final   Extend sample reason 06/03/2021    Final                   Value:NO TRANSFUSIONS OR PREGNANCY IN THE PAST 3  MONTHS Performed at [facility], [address] [place], [place] [address]    MRSA, PCR 06/03/2021 NEGATIVE  NEGATIVE Final   Staphylococcus aureus 06/03/2021 NEGATIVE  NEGATIVE Final   Comment: (NOTE) The Xpert SA Assay (FDA approved for NASAL specimens in patients 61 years of age and older), is one component of a comprehensive surveillance program. It is not intended to diagnose infection nor to guide or monitor treatment. Performed at [facility], [address] [place], [place] [address]     ECG: Date: 06/03/2021 Time ECG obtained: 1310 PM Rate: 78 bpm Rhythm:  Sinus rhythm with first-degree AV block with occasional PVCs Axis (leads I and aVF): Normal Intervals: PR 226 ms. QRS 66 ms. QTc 451 ms. ST segment and T wave changes: No evidence of acute ST segment elevation or depression Comparison: Similar to previous tracing obtained on 08/29/2018   IMAGING / PROCEDURES: MRI SHOULDER RIGHT WO CONTRAST performed on 01/22/2021 Complete tear of the supraspinatus and infraspinatus tendons with 2.8 cm of retraction Large joint effusion  BILATERAL CAROTID DOPPLER performed on 07/14/2020 There is severe atherosclerotic plaque burden with severe (>70%) stenosis in the right internal carotid artery.  There is severe atherosclerotic plaque burden with severe (>70%) stenosis in the left internal carotid artery.  There is antegrade flow noted in the right vertebral artery.   There is retrograde flow noted in the left vertebral artery and asymmetric blood pressures (right >left) consistent with subclavian steal.  Elevated velocities at the origin of the right subclavian artery consistent with >50% stenosis.  Elevated velocities at the right external carotid artery consistent with greater than 50% stenosis.  Compared with prior study  report dated 05/2018 there has been progression of stenosis in the bilateral internal carotid arteries based on increased velocities  bilaterally.  Remainder of findings are unchanged compared to prior study.   TRANSTHORACIC ECHOCARDIOGRAM performed on 09/20/2018 LVEF 55% Moderate concentric LVH with borderline hyperdynamic systolic function Grade 1 diastolic dysfunction Mild left atrial enlargement Right ventricular enlargement with normal function Trivial AR, MR, and TR Mild PR No evidence of valvular stenosis No pericardial effusion  Impression and Plan:  Cassidy Hernandez has been referred for pre-anesthesia review and clearance prior to her undergoing the planned anesthetic and procedural courses. Available labs, pertinent testing, and imaging results were personally reviewed by me. This patient has been appropriately cleared by cardiology with an overall MODERATE risk of significant perioperative cardiovascular complications.  Based on clinical review performed today (06/07/21), barring any significant acute changes in the patient's overall condition, it is anticipated that she will be able to proceed with the planned surgical intervention. Any acute changes in clinical condition may necessitate her procedure being postponed and/or cancelled. Patient will meet with anesthesia team (MD and/or CRNA) on the day of her procedure for preoperative evaluation/assessment. Questions regarding anesthetic course will be fielded at that time.   Pre-surgical instructions were reviewed with the patient during her PAT appointment and questions were fielded by PAT clinical staff. Patient was advised that if any questions or concerns arise prior to her procedure then she should return a call to PAT and/or her surgeon's office to discuss.  Honor Loh, MSN, APRN, FNP-C, CEN [facility]  Peri-operative Services Nurse Practitioner Phone: [phone] Fax: [phone] 06/07/21 8:44 AM  NOTE: This note has been prepared using Dragon dictation software. Despite my best ability to proofread, there is always the potential  that unintentional transcriptional errors may still occur from this process.

## 2021-06-08 ENCOUNTER — Other Ambulatory Visit
Admission: RE | Admit: 2021-06-08 | Discharge: 2021-06-08 | Disposition: A | Discharge status: Home / Self Care | Payer: Medicare Other | Source: Ambulatory Visit | Attending: Surgery | Admitting: Surgery

## 2021-06-08 ENCOUNTER — Other Ambulatory Visit: Payer: Self-pay

## 2021-06-08 DIAGNOSIS — Z20822 Contact with and (suspected) exposure to covid-19: Secondary | ICD-10-CM | POA: Insufficient documentation

## 2021-06-08 DIAGNOSIS — Z01812 Encounter for preprocedural laboratory examination: Secondary | ICD-10-CM | POA: Insufficient documentation

## 2021-06-08 LAB — SARS CORONAVIRUS 2 (TAT 6-24 HRS): SARS Coronavirus 2: NEGATIVE

## 2021-06-10 ENCOUNTER — Inpatient Hospital Stay: Payer: Medicare Other

## 2021-06-10 ENCOUNTER — Inpatient Hospital Stay: Payer: Medicare Other | Admitting: Urgent Care

## 2021-06-10 ENCOUNTER — Inpatient Hospital Stay
Admission: RE | Admit: 2021-06-10 | Discharge: 2021-06-14 | DRG: 483 | Disposition: A | Discharge status: Skilled Nursing Facility | Payer: Medicare Other | Attending: Surgery | Admitting: Surgery

## 2021-06-10 ENCOUNTER — Encounter
Admission: RE | Disposition: A | Discharge status: Skilled Nursing Facility | Payer: Self-pay | Source: Home / Self Care | Attending: Surgery

## 2021-06-10 ENCOUNTER — Other Ambulatory Visit: Payer: Self-pay

## 2021-06-10 ENCOUNTER — Encounter: Payer: Self-pay | Admitting: Surgery

## 2021-06-10 ENCOUNTER — Observation Stay: Payer: Medicare Other

## 2021-06-10 DIAGNOSIS — E7800 Pure hypercholesterolemia, unspecified: Secondary | ICD-10-CM | POA: Diagnosis present

## 2021-06-10 DIAGNOSIS — Z87442 Personal history of urinary calculi: Secondary | ICD-10-CM

## 2021-06-10 DIAGNOSIS — E1122 Type 2 diabetes mellitus with diabetic chronic kidney disease: Secondary | ICD-10-CM | POA: Diagnosis present

## 2021-06-10 DIAGNOSIS — Z882 Allergy status to sulfonamides status: Secondary | ICD-10-CM

## 2021-06-10 DIAGNOSIS — Z96611 Presence of right artificial shoulder joint: Secondary | ICD-10-CM

## 2021-06-10 DIAGNOSIS — S46011A Strain of muscle(s) and tendon(s) of the rotator cuff of right shoulder, initial encounter: Principal | ICD-10-CM | POA: Diagnosis present

## 2021-06-10 DIAGNOSIS — Z86010 Personal history of colonic polyps: Secondary | ICD-10-CM | POA: Diagnosis not present

## 2021-06-10 DIAGNOSIS — F419 Anxiety disorder, unspecified: Secondary | ICD-10-CM | POA: Diagnosis present

## 2021-06-10 DIAGNOSIS — Z981 Arthrodesis status: Secondary | ICD-10-CM

## 2021-06-10 DIAGNOSIS — F325 Major depressive disorder, single episode, in full remission: Secondary | ICD-10-CM | POA: Diagnosis present

## 2021-06-10 DIAGNOSIS — Z9049 Acquired absence of other specified parts of digestive tract: Secondary | ICD-10-CM | POA: Diagnosis not present

## 2021-06-10 DIAGNOSIS — Z8249 Family history of ischemic heart disease and other diseases of the circulatory system: Secondary | ICD-10-CM | POA: Diagnosis not present

## 2021-06-10 DIAGNOSIS — Z85828 Personal history of other malignant neoplasm of skin: Secondary | ICD-10-CM

## 2021-06-10 DIAGNOSIS — N183 Chronic kidney disease, stage 3 unspecified: Secondary | ICD-10-CM | POA: Diagnosis present

## 2021-06-10 DIAGNOSIS — Z90710 Acquired absence of both cervix and uterus: Secondary | ICD-10-CM | POA: Diagnosis not present

## 2021-06-10 DIAGNOSIS — G4700 Insomnia, unspecified: Secondary | ICD-10-CM | POA: Diagnosis present

## 2021-06-10 DIAGNOSIS — Z20822 Contact with and (suspected) exposure to covid-19: Secondary | ICD-10-CM | POA: Diagnosis present

## 2021-06-10 DIAGNOSIS — E1151 Type 2 diabetes mellitus with diabetic peripheral angiopathy without gangrene: Secondary | ICD-10-CM | POA: Diagnosis present

## 2021-06-10 DIAGNOSIS — K219 Gastro-esophageal reflux disease without esophagitis: Secondary | ICD-10-CM | POA: Diagnosis present

## 2021-06-10 DIAGNOSIS — G8929 Other chronic pain: Secondary | ICD-10-CM | POA: Diagnosis present

## 2021-06-10 DIAGNOSIS — I129 Hypertensive chronic kidney disease with stage 1 through stage 4 chronic kidney disease, or unspecified chronic kidney disease: Secondary | ICD-10-CM | POA: Diagnosis present

## 2021-06-10 DIAGNOSIS — K760 Fatty (change of) liver, not elsewhere classified: Secondary | ICD-10-CM | POA: Diagnosis present

## 2021-06-10 DIAGNOSIS — M5416 Radiculopathy, lumbar region: Secondary | ICD-10-CM | POA: Diagnosis present

## 2021-06-10 DIAGNOSIS — W19XXXA Unspecified fall, initial encounter: Secondary | ICD-10-CM | POA: Diagnosis present

## 2021-06-10 DIAGNOSIS — I700 Atherosclerosis of aorta: Secondary | ICD-10-CM | POA: Diagnosis present

## 2021-06-10 DIAGNOSIS — I2510 Atherosclerotic heart disease of native coronary artery without angina pectoris: Secondary | ICD-10-CM | POA: Diagnosis present

## 2021-06-10 DIAGNOSIS — Z7982 Long term (current) use of aspirin: Secondary | ICD-10-CM | POA: Diagnosis not present

## 2021-06-10 DIAGNOSIS — Z79899 Other long term (current) drug therapy: Secondary | ICD-10-CM | POA: Diagnosis not present

## 2021-06-10 DIAGNOSIS — Z419 Encounter for procedure for purposes other than remedying health state, unspecified: Secondary | ICD-10-CM

## 2021-06-10 HISTORY — DX: Chronic kidney disease, stage 3 unspecified: N18.30

## 2021-06-10 HISTORY — DX: Other chronic pain: G89.29

## 2021-06-10 HISTORY — DX: Other ill-defined heart diseases: I51.89

## 2021-06-10 HISTORY — DX: Major depressive disorder, single episode, in full remission: F32.5

## 2021-06-10 HISTORY — DX: Peripheral vascular disease, unspecified: I73.9

## 2021-06-10 HISTORY — DX: Hyperlipidemia, unspecified: E78.5

## 2021-06-10 HISTORY — PX: BICEPT TENODESIS: SHX5116

## 2021-06-10 HISTORY — DX: Atherosclerosis of aorta: I70.0

## 2021-06-10 HISTORY — PX: REVERSE SHOULDER ARTHROPLASTY: SHX5054

## 2021-06-10 HISTORY — DX: Fatty (change of) liver, not elsewhere classified: K76.0

## 2021-06-10 HISTORY — DX: Type 2 diabetes mellitus without complications: E11.9

## 2021-06-10 HISTORY — DX: Radiculopathy, lumbar region: M54.16

## 2021-06-10 HISTORY — DX: Dysarthria and anarthria: R47.1

## 2021-06-10 HISTORY — DX: Angina pectoris, unspecified: I20.9

## 2021-06-10 LAB — GLUCOSE, CAPILLARY
Glucose-Capillary: 116 mg/dL — ABNORMAL HIGH (ref 70–99)
Glucose-Capillary: 164 mg/dL — ABNORMAL HIGH (ref 70–99)
Glucose-Capillary: 146 mg/dL — ABNORMAL HIGH (ref 70–99)

## 2021-06-10 LAB — ABO/RH: ABO/RH(D): A POS

## 2021-06-10 SURGERY — ARTHROPLASTY, SHOULDER, TOTAL, REVERSE
Anesthesia: General | Site: Shoulder | Laterality: Right

## 2021-06-10 MED ORDER — TRANEXAMIC ACID 1000 MG/10ML IV SOLN
INTRAVENOUS | Status: DC | PRN
  Administered 2021-06-10: 1000 mg via TOPICAL

## 2021-06-10 MED ORDER — CEFAZOLIN SODIUM-DEXTROSE 2-4 GM/100ML-% IV SOLN
INTRAVENOUS | Status: AC
  Filled 2021-06-10: qty 100

## 2021-06-10 MED ORDER — BUPIVACAINE HCL (PF) 0.5 % IJ SOLN
INTRAMUSCULAR | Status: AC
  Filled 2021-06-10: qty 20

## 2021-06-10 MED ORDER — BUPIVACAINE-EPINEPHRINE (PF) 0.5% -1:200000 IJ SOLN
INTRAMUSCULAR | Status: DC | PRN
  Administered 2021-06-10: 30 mL

## 2021-06-10 MED ORDER — ENOXAPARIN SODIUM 40 MG/0.4ML IJ SOSY
40.0000 mg | PREFILLED_SYRINGE | INTRAMUSCULAR | Status: DC
  Administered 2021-06-12 – 2021-06-14 (×3): 40 mg via SUBCUTANEOUS
  Filled 2021-06-10 (×3): qty 0.4

## 2021-06-10 MED ORDER — PANTOPRAZOLE SODIUM 40 MG PO TBEC
40.0000 mg | DELAYED_RELEASE_TABLET | Freq: Every day | ORAL | Status: DC
  Administered 2021-06-11 – 2021-06-14 (×4): 40 mg via ORAL
  Filled 2021-06-10 (×5): qty 1

## 2021-06-10 MED ORDER — CHLORTHALIDONE 25 MG PO TABS
12.5000 mg | ORAL_TABLET | Freq: Every day | ORAL | Status: DC
  Administered 2021-06-11 – 2021-06-14 (×4): 12.5 mg via ORAL
  Filled 2021-06-10 (×4): qty 0.5

## 2021-06-10 MED ORDER — LOSARTAN POTASSIUM 50 MG PO TABS
100.0000 mg | ORAL_TABLET | Freq: Every day | ORAL | Status: DC
  Administered 2021-06-12: 100 mg via ORAL
  Filled 2021-06-10 (×5): qty 2

## 2021-06-10 MED ORDER — ACETAMINOPHEN 325 MG PO TABS
325.0000 mg | ORAL_TABLET | Freq: Four times a day (QID) | ORAL | Status: DC | PRN
  Administered 2021-06-12: 325 mg via ORAL

## 2021-06-10 MED ORDER — SUGAMMADEX SODIUM 500 MG/5ML IV SOLN
INTRAVENOUS | Status: DC | PRN
  Administered 2021-06-10: 500 mg via INTRAVENOUS

## 2021-06-10 MED ORDER — EZETIMIBE 10 MG PO TABS
10.0000 mg | ORAL_TABLET | Freq: Every day | ORAL | Status: DC
  Administered 2021-06-11 – 2021-06-14 (×4): 10 mg via ORAL
  Filled 2021-06-10 (×4): qty 1

## 2021-06-10 MED ORDER — ROSUVASTATIN CALCIUM 10 MG PO TABS
20.0000 mg | ORAL_TABLET | Freq: Every day | ORAL | Status: DC
  Administered 2021-06-10 – 2021-06-13 (×4): 20 mg via ORAL
  Filled 2021-06-10: qty 1
  Filled 2021-06-10: qty 2
  Filled 2021-06-10: qty 1
  Filled 2021-06-10 (×2): qty 2
  Filled 2021-06-10: qty 1

## 2021-06-10 MED ORDER — CHLORHEXIDINE GLUCONATE 0.12 % MT SOLN: 15.0000 mL | Freq: Once | OROMUCOSAL | Status: DC

## 2021-06-10 MED ORDER — EPHEDRINE SULFATE 50 MG/ML IJ SOLN
INTRAMUSCULAR | Status: DC | PRN
  Administered 2021-06-10 (×5): 5 mg via INTRAVENOUS

## 2021-06-10 MED ORDER — DEXAMETHASONE SODIUM PHOSPHATE 10 MG/ML IJ SOLN
INTRAMUSCULAR | Status: DC | PRN
  Administered 2021-06-10: 10 mg via INTRAVENOUS

## 2021-06-10 MED ORDER — VASOPRESSIN 20 UNIT/ML IV SOLN
INTRAVENOUS | Status: DC | PRN
  Administered 2021-06-10 (×3): 1 [IU] via INTRAVENOUS
  Administered 2021-06-10: 2 [IU] via INTRAVENOUS

## 2021-06-10 MED ORDER — ACETAMINOPHEN 500 MG PO TABS
ORAL_TABLET | ORAL | Status: AC
  Administered 2021-06-10: 1000 mg via ORAL
  Filled 2021-06-10: qty 2

## 2021-06-10 MED ORDER — LIDOCAINE HCL (PF) 1 % IJ SOLN
INTRAMUSCULAR | Status: AC
  Filled 2021-06-10: qty 5

## 2021-06-10 MED ORDER — EPHEDRINE 5 MG/ML INJ
INTRAVENOUS | Status: AC
  Filled 2021-06-10: qty 5

## 2021-06-10 MED ORDER — BUPIVACAINE LIPOSOME 1.3 % IJ SUSP
INTRAMUSCULAR | Status: DC | PRN
  Administered 2021-06-10: 15 mL via PERINEURAL

## 2021-06-10 MED ORDER — TRANEXAMIC ACID 1000 MG/10ML IV SOLN
INTRAVENOUS | Status: AC
  Filled 2021-06-10: qty 10

## 2021-06-10 MED ORDER — PROPOFOL 10 MG/ML IV BOLUS
INTRAVENOUS | Status: DC | PRN
  Administered 2021-06-10: 120 mg via INTRAVENOUS

## 2021-06-10 MED ORDER — METOCLOPRAMIDE HCL 10 MG PO TABS: 5.0000 mg | ORAL_TABLET | Freq: Three times a day (TID) | ORAL | Status: DC | PRN

## 2021-06-10 MED ORDER — ORAL CARE MOUTH RINSE: 15.0000 mL | Freq: Once | OROMUCOSAL | Status: DC

## 2021-06-10 MED ORDER — FENTANYL CITRATE PF 50 MCG/ML IJ SOSY
PREFILLED_SYRINGE | INTRAMUSCULAR | Status: AC
  Filled 2021-06-10: qty 1

## 2021-06-10 MED ORDER — SODIUM CHLORIDE 0.9 % IV SOLN: INTRAVENOUS | Status: DC

## 2021-06-10 MED ORDER — AMLODIPINE BESYLATE 5 MG PO TABS
5.0000 mg | ORAL_TABLET | Freq: Every day | ORAL | Status: DC
  Administered 2021-06-12 – 2021-06-13 (×2): 5 mg via ORAL
  Filled 2021-06-10 (×5): qty 1

## 2021-06-10 MED ORDER — ONDANSETRON HCL 4 MG/2ML IJ SOLN: 4.0000 mg | Freq: Once | INTRAMUSCULAR | Status: DC | PRN

## 2021-06-10 MED ORDER — MIDAZOLAM HCL 2 MG/2ML IJ SOLN: 1.0000 mg | Freq: Once | INTRAMUSCULAR | Status: AC

## 2021-06-10 MED ORDER — DOCUSATE SODIUM 100 MG PO CAPS
100.0000 mg | ORAL_CAPSULE | Freq: Two times a day (BID) | ORAL | Status: DC
  Administered 2021-06-10 – 2021-06-14 (×8): 100 mg via ORAL
  Filled 2021-06-10 (×9): qty 1

## 2021-06-10 MED ORDER — LIDOCAINE HCL (CARDIAC) PF 100 MG/5ML IV SOSY
PREFILLED_SYRINGE | INTRAVENOUS | Status: DC | PRN
  Administered 2021-06-10: 40 mg via INTRAVENOUS

## 2021-06-10 MED ORDER — ADULT MULTIVITAMIN W/MINERALS CH
1.0000 | ORAL_TABLET | Freq: Every day | ORAL | Status: DC
  Administered 2021-06-11 – 2021-06-14 (×4): 1 via ORAL
  Filled 2021-06-10 (×4): qty 1

## 2021-06-10 MED ORDER — CEFAZOLIN SODIUM-DEXTROSE 2-4 GM/100ML-% IV SOLN: 2.0000 g | Freq: Four times a day (QID) | INTRAVENOUS | Status: AC

## 2021-06-10 MED ORDER — ACETAMINOPHEN 500 MG PO TABS
ORAL_TABLET | ORAL | Status: AC
  Filled 2021-06-10: qty 2

## 2021-06-10 MED ORDER — BUPIVACAINE LIPOSOME 1.3 % IJ SUSP
INTRAMUSCULAR | Status: AC
  Filled 2021-06-10: qty 20

## 2021-06-10 MED ORDER — SODIUM CHLORIDE FLUSH 0.9 % IV SOLN
INTRAVENOUS | Status: AC
  Filled 2021-06-10: qty 40

## 2021-06-10 MED ORDER — SODIUM CHLORIDE 0.9 % IR SOLN
Status: DC | PRN
  Administered 2021-06-10: 3000 mL

## 2021-06-10 MED ORDER — MULTIVITAMINS PO CAPS: 1.0000 | ORAL_CAPSULE | Freq: Every day | ORAL | Status: DC

## 2021-06-10 MED ORDER — CEFAZOLIN SODIUM-DEXTROSE 2-4 GM/100ML-% IV SOLN
INTRAVENOUS | Status: AC
  Administered 2021-06-10: 2 g via INTRAVENOUS
  Filled 2021-06-10: qty 100

## 2021-06-10 MED ORDER — CHLORHEXIDINE GLUCONATE 0.12 % MT SOLN
OROMUCOSAL | Status: AC
  Filled 2021-06-10: qty 15

## 2021-06-10 MED ORDER — LACTATED RINGERS IV SOLN: INTRAVENOUS | Status: DC | PRN

## 2021-06-10 MED ORDER — CARVEDILOL 3.125 MG PO TABS
3.1250 mg | ORAL_TABLET | Freq: Two times a day (BID) | ORAL | Status: DC
  Administered 2021-06-10 – 2021-06-14 (×7): 3.125 mg via ORAL
  Filled 2021-06-10 (×9): qty 1

## 2021-06-10 MED ORDER — ACETAMINOPHEN 500 MG PO TABS
1000.0000 mg | ORAL_TABLET | Freq: Four times a day (QID) | ORAL | Status: AC
  Administered 2021-06-10: 1000 mg via ORAL

## 2021-06-10 MED ORDER — ROCURONIUM BROMIDE 100 MG/10ML IV SOLN
INTRAVENOUS | Status: DC | PRN
  Administered 2021-06-10: 30 mg via INTRAVENOUS

## 2021-06-10 MED ORDER — PHENYLEPHRINE HCL-NACL 20-0.9 MG/250ML-% IV SOLN
INTRAVENOUS | Status: DC | PRN
  Administered 2021-06-10: 60 ug/min via INTRAVENOUS
  Administered 2021-06-10: 25 ug/min via INTRAVENOUS

## 2021-06-10 MED ORDER — MAGNESIUM CHLORIDE 64 MG PO TBEC
1.0000 | DELAYED_RELEASE_TABLET | Freq: Every day | ORAL | Status: DC
  Administered 2021-06-11 – 2021-06-14 (×4): 64 mg via ORAL
  Filled 2021-06-10 (×4): qty 1

## 2021-06-10 MED ORDER — FENTANYL CITRATE PF 50 MCG/ML IJ SOSY: 50.0000 ug | PREFILLED_SYRINGE | Freq: Once | INTRAMUSCULAR | Status: DC

## 2021-06-10 MED ORDER — TRAMADOL HCL 50 MG PO TABS
50.0000 mg | ORAL_TABLET | Freq: Four times a day (QID) | ORAL | Status: DC | PRN
  Administered 2021-06-12: 50 mg via ORAL
  Filled 2021-06-10: qty 1

## 2021-06-10 MED ORDER — PROPOFOL 500 MG/50ML IV EMUL
INTRAVENOUS | Status: DC | PRN
  Administered 2021-06-10: 75 ug/kg/min via INTRAVENOUS

## 2021-06-10 MED ORDER — ONDANSETRON HCL 4 MG/2ML IJ SOLN: 4.0000 mg | Freq: Four times a day (QID) | INTRAMUSCULAR | Status: DC | PRN

## 2021-06-10 MED ORDER — CEFAZOLIN SODIUM-DEXTROSE 2-4 GM/100ML-% IV SOLN
2.0000 g | INTRAVENOUS | Status: AC
  Administered 2021-06-10: 2 g via INTRAVENOUS

## 2021-06-10 MED ORDER — ASPIRIN EC 81 MG PO TBEC
81.0000 mg | DELAYED_RELEASE_TABLET | Freq: Every day | ORAL | Status: DC
  Administered 2021-06-12 – 2021-06-14 (×3): 81 mg via ORAL
  Filled 2021-06-10 (×4): qty 1

## 2021-06-10 MED ORDER — DIPHENHYDRAMINE HCL 12.5 MG/5ML PO ELIX
12.5000 mg | ORAL_SOLUTION | ORAL | Status: DC | PRN
  Filled 2021-06-10 (×2): qty 10

## 2021-06-10 MED ORDER — OXYCODONE HCL 5 MG PO TABS
5.0000 mg | ORAL_TABLET | ORAL | Status: DC | PRN
  Administered 2021-06-12 (×3): 10 mg via ORAL
  Administered 2021-06-13 – 2021-06-14 (×5): 5 mg via ORAL
  Filled 2021-06-10: qty 2
  Filled 2021-06-10: qty 1
  Filled 2021-06-10: qty 2
  Filled 2021-06-10 (×2): qty 1
  Filled 2021-06-10: qty 2
  Filled 2021-06-10 (×2): qty 1

## 2021-06-10 MED ORDER — MIDAZOLAM HCL 2 MG/2ML IJ SOLN
INTRAMUSCULAR | Status: AC
  Administered 2021-06-10: 1 mg via INTRAVENOUS
  Filled 2021-06-10: qty 2

## 2021-06-10 MED ORDER — LIDOCAINE HCL (PF) 1 % IJ SOLN
INTRAMUSCULAR | Status: DC | PRN
  Administered 2021-06-10: 3 mL via SUBCUTANEOUS

## 2021-06-10 MED ORDER — PROPOFOL 10 MG/ML IV BOLUS: INTRAVENOUS | Status: DC | PRN

## 2021-06-10 MED ORDER — BISACODYL 10 MG RE SUPP
10.0000 mg | Freq: Every day | RECTAL | Status: DC | PRN
  Administered 2021-06-13: 10 mg via RECTAL
  Filled 2021-06-10 (×2): qty 1

## 2021-06-10 MED ORDER — FLEET ENEMA 7-19 GM/118ML RE ENEM: 1.0000 | ENEMA | Freq: Once | RECTAL | Status: DC | PRN

## 2021-06-10 MED ORDER — ISOSORBIDE MONONITRATE ER 30 MG PO TB24
60.0000 mg | ORAL_TABLET | Freq: Every day | ORAL | Status: DC
  Administered 2021-06-11 – 2021-06-13 (×3): 60 mg via ORAL
  Filled 2021-06-10: qty 1
  Filled 2021-06-10 (×4): qty 2

## 2021-06-10 MED ORDER — BUPIVACAINE-EPINEPHRINE (PF) 0.5% -1:200000 IJ SOLN
INTRAMUSCULAR | Status: AC
  Filled 2021-06-10: qty 30

## 2021-06-10 MED ORDER — METOCLOPRAMIDE HCL 5 MG/ML IJ SOLN: 5.0000 mg | Freq: Three times a day (TID) | INTRAMUSCULAR | Status: DC | PRN

## 2021-06-10 MED ORDER — ONDANSETRON HCL 4 MG PO TABS: 4.0000 mg | ORAL_TABLET | Freq: Four times a day (QID) | ORAL | Status: DC | PRN

## 2021-06-10 MED ORDER — ESCITALOPRAM OXALATE 10 MG PO TABS
10.0000 mg | ORAL_TABLET | Freq: Every day | ORAL | Status: DC
  Administered 2021-06-11 – 2021-06-14 (×4): 10 mg via ORAL
  Filled 2021-06-10 (×4): qty 1

## 2021-06-10 MED ORDER — MAGNESIUM HYDROXIDE 400 MG/5ML PO SUSP: 30.0000 mL | Freq: Every day | ORAL | Status: DC | PRN

## 2021-06-10 MED ORDER — PROPOFOL 10 MG/ML IV BOLUS
INTRAVENOUS | Status: AC
  Filled 2021-06-10: qty 20

## 2021-06-10 MED ORDER — PHENYLEPHRINE HCL (PRESSORS) 10 MG/ML IV SOLN
INTRAVENOUS | Status: DC | PRN
  Administered 2021-06-10: 160 ug via INTRAVENOUS

## 2021-06-10 MED ORDER — BUPIVACAINE HCL (PF) 0.5 % IJ SOLN
INTRAMUSCULAR | Status: DC | PRN
  Administered 2021-06-10: 12 mL via PERINEURAL

## 2021-06-10 MED ORDER — NITROGLYCERIN 0.4 MG SL SUBL: 0.4000 mg | SUBLINGUAL_TABLET | SUBLINGUAL | Status: DC | PRN

## 2021-06-10 MED ORDER — 0.9 % SODIUM CHLORIDE (POUR BTL) OPTIME
TOPICAL | Status: DC | PRN
  Administered 2021-06-10: 100 mL

## 2021-06-10 MED ORDER — FENTANYL CITRATE (PF) 100 MCG/2ML IJ SOLN: 25.0000 ug | INTRAMUSCULAR | Status: DC | PRN

## 2021-06-10 MED ORDER — ONDANSETRON HCL 4 MG/2ML IJ SOLN
INTRAMUSCULAR | Status: DC | PRN
  Administered 2021-06-10: 4 mg via INTRAVENOUS

## 2021-06-10 SURGICAL SUPPLY — 71 items
BASEPLATE BOSS DRILL (MISCELLANEOUS) ×3 IMPLANT
BIT DRILL 2.5 (BIT) ×3
BIT DRILL 2.5X4.5XSCR (BIT) ×2 IMPLANT
BIT DRILL BASEPLATE CENTRAL S (BIT) ×3 IMPLANT
BIT DRL 2.5X4.5XSCR (BIT) ×2
BLADE SAW SAG 25X90X1.19 (BLADE) ×3 IMPLANT
BNDG COHESIVE 4X5 TAN ST LF (GAUZE/BANDAGES/DRESSINGS) ×3 IMPLANT
CHLORAPREP W/TINT 26 (MISCELLANEOUS) ×3 IMPLANT
COOLER POLAR GLACIER W/PUMP (MISCELLANEOUS) ×3 IMPLANT
COVER BACK TABLE REUSABLE LG (DRAPES) ×3 IMPLANT
DRAPE 3/4 80X56 (DRAPES) ×6 IMPLANT
DRAPE IMP U-DRAPE 54X76 (DRAPES) ×6 IMPLANT
DRAPE INCISE IOBAN 66X45 STRL (DRAPES) ×6 IMPLANT
DRSG OPSITE POSTOP 4X8 (GAUZE/BANDAGES/DRESSINGS) ×3 IMPLANT
ELECT BLADE 6.5 EXT (BLADE) IMPLANT
ELECT CAUTERY BLADE 6.4 (BLADE) ×3 IMPLANT
ELECT REM PT RETURN 9FT ADLT (ELECTROSURGICAL) ×3
ELECTRODE REM PT RTRN 9FT ADLT (ELECTROSURGICAL) ×2 IMPLANT
GAUZE XEROFORM 1X8 LF (GAUZE/BANDAGES/DRESSINGS) ×3 IMPLANT
GLENOSPHERE RSS 2 CONCENTRIC (Shoulder) ×3 IMPLANT
GLOVE SRG 8 PF TXTR STRL LF DI (GLOVE) ×4 IMPLANT
GLOVE SURG ENC MOIS LTX SZ7.5 (GLOVE) ×12 IMPLANT
GLOVE SURG ENC MOIS LTX SZ8 (GLOVE) ×12 IMPLANT
GLOVE SURG UNDER LTX SZ8 (GLOVE) ×3 IMPLANT
GLOVE SURG UNDER POLY LF SZ8 (GLOVE) ×6
GOWN STRL REUS W/ TWL LRG LVL3 (GOWN DISPOSABLE) ×2 IMPLANT
GOWN STRL REUS W/ TWL XL LVL3 (GOWN DISPOSABLE) ×2 IMPLANT
GOWN STRL REUS W/TWL LRG LVL3 (GOWN DISPOSABLE) ×3
GOWN STRL REUS W/TWL XL LVL3 (GOWN DISPOSABLE) ×3
GUIDE PIN 2.0 X 150 (WIRE) ×3 IMPLANT
ILLUMINATOR WAVEGUIDE N/F (MISCELLANEOUS) IMPLANT
IV NS IRRIG 3000ML ARTHROMATIC (IV SOLUTION) ×3 IMPLANT
KIT STABILIZATION SHOULDER (MISCELLANEOUS) ×3 IMPLANT
KIT TURNOVER KIT A (KITS) ×3 IMPLANT
LINER STD +3S RSS HXL (Liner) ×3 IMPLANT
MANIFOLD NEPTUNE II (INSTRUMENTS) ×3 IMPLANT
MASK FACE SPIDER DISP (MASK) ×3 IMPLANT
MAT ABSORB  FLUID 56X50 GRAY (MISCELLANEOUS) ×3
MAT ABSORB FLUID 56X50 GRAY (MISCELLANEOUS) ×2 IMPLANT
NDL SAFETY ECLIPSE 18X1.5 (NEEDLE) ×2 IMPLANT
NEEDLE HYPO 18GX1.5 SHARP (NEEDLE) ×3
NEEDLE HYPO 22GX1.5 SAFETY (NEEDLE) ×3 IMPLANT
NEEDLE SPNL 20GX3.5 QUINCKE YW (NEEDLE) ×3 IMPLANT
NS IRRIG 500ML POUR BTL (IV SOLUTION) ×3 IMPLANT
PACK ARTHROSCOPY SHOULDER (MISCELLANEOUS) ×3 IMPLANT
PAD ARMBOARD 7.5X6 YLW CONV (MISCELLANEOUS) ×3 IMPLANT
PAD WRAPON POLAR SHDR UNIV (MISCELLANEOUS) ×2 IMPLANT
PENCIL SMOKE EVACUATOR (MISCELLANEOUS) ×3 IMPLANT
PLATE BASE REVERSE RSS S (Plate) ×3 IMPLANT
PULSAVAC PLUS IRRIG FAN TIP (DISPOSABLE) ×3
SCREW 4.5X15 RSS W CAP (Screw) ×9 IMPLANT
SCREW 4.5X25 RSS W CAP (Screw) ×3 IMPLANT
SCREW 4.5X50 RSS W CAP (Screw) ×3 IMPLANT
SCREW BN 40X4.5XSTAR CAP (Screw) ×2 IMPLANT
SCREW BODY REVERSE STD (Screw) ×3 IMPLANT
SLING ULTRA II M (MISCELLANEOUS) ×3 IMPLANT
SPONGE T-LAP 18X18 ~~LOC~~+RFID (SPONGE) ×6 IMPLANT
STAPLER SKIN PROX 35W (STAPLE) ×3 IMPLANT
STEM PRESS FIT SZ 07 TSS (Stem) ×3 IMPLANT
SUT ETHIBOND 0 MO6 C/R (SUTURE) ×3 IMPLANT
SUT FIBERWIRE #2 38 BLUE 1/2 (SUTURE) ×12
SUT VIC AB 0 CT1 36 (SUTURE) ×3 IMPLANT
SUT VIC AB 2-0 CT1 27 (SUTURE) ×6
SUT VIC AB 2-0 CT1 TAPERPNT 27 (SUTURE) ×4 IMPLANT
SUTURE FIBERWR #2 38 BLUE 1/2 (SUTURE) ×8 IMPLANT
SYR 10ML LL (SYRINGE) ×3 IMPLANT
SYR 30ML LL (SYRINGE) ×3 IMPLANT
TIP FAN IRRIG PULSAVAC PLUS (DISPOSABLE) ×2 IMPLANT
TOWEL OR 17X26 4PK STRL BLUE (TOWEL DISPOSABLE) ×3 IMPLANT
WATER STERILE IRR 500ML POUR (IV SOLUTION) ×3 IMPLANT
WRAPON POLAR PAD SHDR UNIV (MISCELLANEOUS) ×3

## 2021-06-10 NOTE — H&P (Signed)
History of Present Illness:  Cassidy Hernandez is a 80 y.o. female who presents for follow-up of her right shoulder pain and weakness secondary to impingement/tendinopathy with an MRI documented large rotator cuff tear. The patient was last seen for the symptoms 2 months ago. At this visit, she received a steroid injection and started in formal physical therapy which she states has provided moderate improvement in her symptoms and functional capabilities. However, she still has difficulty with activities at or above shoulder level. She is sleeping better at night and can reach behind her back without too much difficulty. She rates her pain at 0/10 at rest, but notes that her pain can go up to a 5/10 with activities. She has been taking Tylenol with limited benefit. She denies any reinjury to the shoulder, and denies any numbness or paresthesias down her arm to her hand. She is sleeping well at night in that her shoulder is not waking her up.  Current Outpatient Medications:  amLODIPine (NORVASC) 5 MG tablet TAKE 1 TABLET BY MOUTH EVERY DAY 90 tablet 1   aspirin 81 MG EC tablet Take 81 mg by mouth daily.   carvediloL (COREG) 3.125 MG tablet TAKE 1 TABLET BY MOUTH TWICE A DAY WITH MEALS 180 tablet 3   chlorthalidone 25 MG tablet TAKE 1/2 TABLET BY MOUTH EVERY DAY 45 tablet 8   escitalopram oxalate (LEXAPRO) 10 MG tablet Take 1 tablet (10 mg total) by mouth once daily 90 tablet 3   ezetimibe (ZETIA) 10 mg tablet Take 1 tablet (10 mg total) by mouth once daily 90 tablet 3   isosorbide mononitrate (IMDUR) 60 MG ER tablet TAKE 1 TABLET BY MOUTH EVERY DAY 90 tablet 3   losartan (COZAAR) 100 MG tablet TAKE 1 TABLET BY MOUTH EVERY DAY 90 tablet 1   magnesium chloride (SLOW-MAG) 71.5 mg DR tablet Take 1 tablet by mouth once daily Each tablet contains 71.5 mg of magnesium , one or two extra a week also   multivitamin capsule Take 1 capsule by mouth daily.   nitroGLYcerin (NITROSTAT) 0.4 MG SL tablet Place 1 tablet  (0.4 mg total) under the tongue every 5 (five) minutes as needed for Chest pain May take up to 3 doses. 25 tablet 11   omeprazole (PRILOSEC) 20 MG DR capsule TAKE 1 CAPSULE BY MOUTH EVERY DAY 90 capsule 1   rosuvastatin (CRESTOR) 20 MG tablet TAKE 1 TABLET BY MOUTH EVERY DAY 90 tablet 1   Allergies:   Sulfa (Sulfonamide Antibiotics) Unknown   Past Medical History:   Anxiety (Situational stress/anxiety)   Bilateral carpal tunnel syndrome   CAD ([facility]normal [facility] 5/11 anterior, normal ef. SE 10/12:THA (-)echo, (+) ekg no angina. Normal ef)   Carotid artery stenosis 01/2010 ([place] 60%right, 70%left)   Chest pain, unspecified   Costochondritis   Degenerative arthritis   Diabetes mellitus without complication (CMS-HCC)   Fatty liver   GERD (gastroesophageal reflux disease)   History of colon polyps   Hypercholesteremia   Hypertension   Insomnia   Kidney disease   Kidney stone   Laryngeal granuloma   History of left lower lobe pneumonia   Poor intravenous access   Renal mass, right (noted on CT 05/2015- needs MRI)   Subclavian steal syndrome left   Urinary incontinence and frequency   Past Surgical History:   AUGMENTATION MAMMOPLASTY BILATERAL W/O PROSTHESIS   bladder polps removal   CATARACT EXTRACTION (extraction-left)   CHOLECYSTECTOMY   COLONOSCOPY 10/06/1998 (1 Rectal Polyp w/unknown  pathology)   COLONOSCOPY 11/18/2010  12/23/2004; 1 Rectal Polyp w/unknown pathology in 2006: CBF 11/2015; Recall Ltr mailed 10/01/2015 (dw)   COLONOSCOPY 02/11/2016 (Sessile Serrated Adenoma: CBF 02/2021)   FLEXIBLE SIGMOIDOSCOPY 08/04/1998 (Small Rectal Polyp - Schedule Colonoscopy)   HYSTERECTOMY - Total   INSERTION MORSELIZED BONE ALLOGRAFT FOR SPINE SURGERY N/A 10/04/2018  Procedure: ALLOGRAFT, MORSELIZED, OR PLACEMENT OF OSTEOPROMOTIVE MATERIAL, FOR SPINE SURGERY ONLY (LIST IN ADDITION TO PRIMARY PROCEDURE); Surgeon: Malen Gauze, MD; Location: [place]; Service: Neurosurgery;  Laterality: N/A;   INTRAOPERATIVE FLUOROSCOPY N/A 10/04/2018  Procedure: FLUOROSCOPY; Surgeon: Malen Gauze, MD; Location: [place]; Service: Neurosurgery; Laterality: N/A;   laryngeal biopsy 01/1998   SEPTOPLASTY   skin cancer removal 09/18/2019 (Face)   TONSILLECTOMY   TRANSFORAMINAL LUMBAR INTERBODY FUSION MINIMALLY INVASIVE N/A 10/04/2018 (Procedure: MAS L4-L5 TLIF; Surgeon: Dr. Lacinda Axon; Location: [place] [facility]; Service: Neurosurgery)   Family History:   Myocardial Infarction (Heart attack) Father   Coronary Artery Disease (Blocked arteries around heart) Father   Coronary Artery Disease (Blocked arteries around heart) Mother   Myocardial Infarction (Heart attack) Mother   Social History:   Socioeconomic History:   Marital status: Widowed  Tobacco Use   Smoking status: Never Smoker   Smokeless tobacco: Never Used  Vaping Use   Vaping Use: Never used  Substance and Sexual Activity   Alcohol use: No  Alcohol/week: 0.0 standard drinks   Drug use: Not Currently   Sexual activity: Not Currently   Review of Systems:  A comprehensive 14 point ROS was performed, reviewed, and the pertinent orthopaedic findings are documented in the HPI.  Physical Exam: Vitals:  04/30/21 1237  BP: 132/78  Weight: 80.4 kg (177 lb 3.2 oz)  Height: 160 cm (5\' 3" )  PainSc: 0-No pain  PainLoc: Shoulder   General/Constitutional: The patient appears to be well-nourished, well-developed, and in no acute distress. Neuro/Psych: Normal mood and affect, oriented to person, place and time.  Right shoulder exam: SKIN: Normal SWELLING: None WARMTH: None LYMPH NODES: No adenopathy palpable CREPITUS: None TENDERNESS: Minimally tender along lateral acromion ROM (active):  Forward flexion: 105 degrees Abduction: 100 degrees Internal rotation: L1 ROM (passive):  Forward flexion: 145 degrees Abduction: 140 degrees ER/IR at 90 abd: 90 degrees/60 degrees   She experiences mild pain at the extremes of all  motions.   STRENGTH: Forward flexion: 3-3+/5 Abduction: 3/5 External rotation: 3-3+/5 Internal rotation: 4-4+/5 Pain with RC testing: Mild-moderate pain with resisted forward flexion and abduction, and mild pain with resisted external rotation.   STABILITY: Normal   SPECIAL TESTS: Luan Pulling' test: Mildly positive Speed's test: Not evaluated Capsulitis - pain w/ passive ER: No Crossed arm test: Minimally positive Crank: Not evaluated Anterior apprehension: Negative Posterior apprehension: Not evaluated   She remains neurovascularly intact to the right upper extremity.  Assessment:  Traumatic complete tear of right rotator cuff.   Rotator cuff tendinitis, right.  Plan: The treatment options were discussed with the patient, as well as with her daughter by phone. In addition, patient educational materials were provided regarding the diagnosis and treatment options. The patient is quite frustrated by her persistent symptoms and function limitations, and is at least willing to listen to more aggressive treatment options. We had a detailed discussion as to the potential treatment options including both repair of the large rotator cuff tear, which would carry a low success rate, as well as a reverse total shoulder arthroplasty which I would recommend in her situation. We discussed the procedure itself as  well as the postoperative regimen. The patient would like to think about these options and discuss them with her family in more detail. She also would like to get clearance from her cardiologist before committing to the surgery. Meanwhile, I have recommended that she continue with physical therapy and her home exercise program. She may gradually progress in her activities as symptoms are made, but is to avoid offending activities. She may take over-the-counter medications as needed for discomfort. All of the patient's questions and concerns were answered. She can call any time with further concerns.  She will follow up on an as necessary basis per her request. She is encouraged to call if she would like to proceed with scheduling the reverse total shoulder replacement surgery.   H&P reviewed and patient re-examined. No changes.

## 2021-06-10 NOTE — Evaluation (Signed)
Physical Therapy Evaluation Patient Details Name: Cassidy Hernandez MRN: [id number] DOB: [birth year] Today's Date: 06/10/2021  History of Present Illness  Pt is an 80 y.o. 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  Clinical Impression  Prior to surgery, pt was independent with ambulation; lives alone in a 1 level home with level entry.  Currently pt is mod assist with bed mobility; min assist with transfers; and min assist ambulating (with hand hold assist) 25 feet x2.  No R shoulder pain reported during session (pt received interscalene block for surgery).  Pt would benefit from skilled PT to address noted impairments and functional limitations (see below for any additional details).  Upon hospital discharge, pt would benefit from continued physical therapy.    Recommendations for follow up therapy are one component of a multi-disciplinary discharge planning process, led by the attending physician.  Recommendations may be updated based on patient status, additional functional criteria and insurance authorization.  Follow Up Recommendations Follow physician's recommendations for discharge plan and follow up therapies    Assistance Recommended at Discharge Frequent or constant Supervision/Assistance  Functional Status Assessment Patient has had a recent decline in their functional status and demonstrates the ability to make significant improvements in function in a reasonable and predictable amount of time.  Equipment Recommendations  Cane;3in1 (PT)    Recommendations for Other Services OT consult     Precautions / Restrictions Precautions Precautions: Fall;Shoulder Shoulder Interventions: Shoulder sling/immobilizer;Shoulder  abduction pillow Restrictions Weight Bearing Restrictions: Yes RUE Weight Bearing: Non weight bearing Other Position/Activity Restrictions: NWB through R UE or hand      Mobility  Bed Mobility Overal bed mobility: Needs Assistance Bed Mobility: Supine to Sit;Sit to Supine     Supine to sit: Mod assist;HOB elevated Sit to supine: Mod assist; HOB elevated   General bed mobility comments: assist for trunk semi-supine to sitting edge of bed; assist for LE's sit to semi-supine in bed; vc's for technique    Transfers Overall transfer level: Needs assistance Equipment used: 1 person hand held assist Transfers: Sit to/from Stand;Stand Pivot Transfers Sit to Stand: Min assist Stand pivot transfers: Min assist         General transfer comment: L hand hold assist; vc's for technique; x2 trials from bed and x1 trial from toilet    Ambulation/Gait Ambulation/Gait assistance: Min assist Gait Distance (Feet):  (25 feet x2) Assistive device: 1 person hand held assist   Gait velocity: decreased   General Gait Details: wider BOS; decreased B LE step length; increased B lateral sway  Stairs            Wheelchair Mobility    Modified Rankin (Stroke Patients Only)       Balance Overall balance assessment: Needs assistance Sitting-balance support: No upper extremity supported;Feet supported Sitting balance-Leahy Scale: Good Sitting balance - Comments: steady sitting reaching within BOS with L UE   Standing balance support: Single extremity supported Standing balance-Leahy Scale: Fair Standing balance comment: pt requiring at least single UE support for static standing balance                             Pertinent Vitals/Pain Pain Assessment: No/denies pain HR WFL during sessions activities.  O2 sats decreased to 80-81% on room air with activity (nurse reports anticipate d/t anesthesia) so pt placed back on 2 L O2 via nasal cannula and O2 sats 93% at rest  end of session.    Home Living Family/patient expects to be discharged to:: Private residence Living Arrangements: Alone Available Help at Discharge: Family;Available PRN/intermittently (pt's sister) Type of Home: House Home Access: Level entry       Home Layout: One level Home Equipment: Grab bars - tub/shower      Prior Function Prior Level of Function : Independent/Modified Independent             Mobility Comments: Fall on May 7th 2022 (causing R shoulder issues)       Hand Dominance        Extremity/Trunk Assessment   Upper Extremity Assessment Upper Extremity Assessment: Defer to OT evaluation;RUE deficits/detail RUE Deficits / Details: R UE in shoulder immobilizer with abduction pillow (able to actively move fingers on R hand) RUE: Unable to fully assess due to immobilization    Lower Extremity Assessment Lower Extremity Assessment: Generalized weakness    Cervical / Trunk Assessment Cervical / Trunk Assessment: Other exceptions Cervical / Trunk Exceptions: forward head/shoulders  Communication   Communication: No difficulties  Cognition Arousal/Alertness: Awake/alert Behavior During Therapy: WFL for tasks assessed/performed Overall Cognitive Status: Within Functional Limits for tasks assessed                                          General Comments  Nursing cleared pt for participation in physical therapy.  Pt agreeable to PT session but requesting to toilet.  Pt's daughter and pt's sister present end of session.    Exercises  Transfer/mobility training   Assessment/Plan    PT Assessment Patient needs continued PT services  PT Problem List Decreased strength;Decreased range of motion;Decreased activity tolerance;Decreased balance;Decreased mobility;Decreased knowledge of use of DME;Decreased knowledge of precautions;Decreased skin integrity;Pain       PT Treatment Interventions DME instruction;Gait training;Functional  mobility training;Therapeutic activities;Therapeutic exercise;Balance training;Patient/family education    PT Goals (Current goals can be found in the Care Plan section)  Acute Rehab PT Goals Patient Stated Goal: to improve strength and mobility PT Goal Formulation: With patient Time For Goal Achievement: 06/24/21 Potential to Achieve Goals: Good    Frequency BID   Barriers to discharge Decreased caregiver support      Co-evaluation               AM-PAC PT "6 Clicks" Mobility  Outcome Measure Help needed turning from your back to your side while in a flat bed without using bedrails?: A Little Help needed moving from lying on your back to sitting on the side of a flat bed without using bedrails?: A Lot Help needed moving to and from a bed to a chair (including a wheelchair)?: A Little Help needed standing up from a chair using your arms (e.g., wheelchair or bedside chair)?: A Little Help needed to walk in hospital room?: A Little Help needed climbing 3-5 steps with a railing? : A Lot 6 Click Score: 16    End of Session Equipment Utilized During Treatment: Gait belt;Other (comment) (R shoulder immobilizer) Activity Tolerance: Patient tolerated treatment well Patient left: in bed;with call bell/phone within reach;with bed alarm set;with nursing/sitter in room;with family/visitor present;with SCD's reapplied;Other (comment) (B heels floating via pillow; polar care  in place) Nurse Communication: Mobility status;Precautions;Other (comment) (pt's O2 sats on room air) PT Visit Diagnosis: Unsteadiness on feet (R26.81);Other abnormalities of gait and mobility (R26.89);Muscle weakness (generalized) (M62.81);History of falling (Z91.81);Pain Pain - Right/Left: Right Pain - part of body: Shoulder    Time: 1645-1710 PT Time Calculation (min) (ACUTE ONLY): 25 min   Charges:   PT Evaluation $PT Eval Low Complexity: 1 Low PT Treatments $Therapeutic Activity: 8-22 mins       Leitha Bleak, PT 06/10/21, 5:37 PM

## 2021-06-10 NOTE — Transfer of Care (Signed)
Immediate Anesthesia Transfer of Care Note  Patient: Cassidy Hernandez  Procedure(s) Performed: REVERSE SHOULDER ARTHROPLASTY (Right: Shoulder)  Patient Location: PACU  Anesthesia Type:MAC  Level of Consciousness: awake  Airway & Oxygen Therapy: Patient Spontanous Breathing  Post-op Assessment: Report given to RN  Post vital signs: stable  Last Vitals:  Vitals Value Taken Time  BP 98/61 06/10/21 1401  Temp    Pulse 57 06/10/21 1403  Resp 23 06/10/21 1403  SpO2 83 % 06/10/21 1403  Vitals shown include unvalidated device data.  Last Pain:  Vitals:   06/10/21 1052  TempSrc:   PainSc: 0-No pain         Complications: No notable events documented.

## 2021-06-10 NOTE — Anesthesia Preprocedure Evaluation (Addendum)
Anesthesia Evaluation  Patient identified by MRN, date of birth, ID band Patient awake    Reviewed: Allergy & Precautions, H&P , NPO status , Patient's Chart, lab work & pertinent test results, reviewed documented beta blocker date and time   Airway Mallampati: III  TM Distance: >3 FB Neck ROM: full    Dental  (+) Teeth Intact, Chipped, Poor Dentition   Pulmonary neg pulmonary ROS,    Pulmonary exam normal        Cardiovascular Exercise Tolerance: Good hypertension, On Medications + angina with exertion + CAD and + Peripheral Vascular Disease  Normal cardiovascular exam Rhythm:regular Rate:Normal  Previous Cath report noted and no prior angioplasty or stent indicated.ja   Neuro/Psych  Neuromuscular disease negative psych ROS   GI/Hepatic Neg liver ROS, GERD  ,  Endo/Other  negative endocrine ROSdiabetes  Renal/GU CRFRenal disease  negative genitourinary   Musculoskeletal  (+) Arthritis ,   Abdominal   Peds  Hematology negative hematology ROS (+)   Anesthesia Other Findings Past Medical History: No date: Anginal pain ([place]) No date: Aortic atherosclerosis No date: Cancer ([place]) 02/01/2010: Carotid artery stenosis     Comment:  a.) Doppler 29/79/8921: 19% LICA and 41% RICA. b.)               Doppler 74/03/1447: >18% LICA and 56% RICA. c.) Doppler               10/25/16: >70% Bilater ICAs No date: Chronic bilateral low back pain with left-sided sciatica No date: CKD (chronic kidney disease), stage III (HCC) No date: Coronary artery disease     Comment:  a.) LHC 12/24/2014 -> small LAD system; diffuse CTO of               LAD. 60% RCA. LM and LCx with no sig disease. no               intervention; med mgmt. No date: Degenerative arthritis No date: Diastolic dysfunction     Comment:  a.) TTE 09/20/2018: EF 55%, G1DD, mild LAE and RVE,               triv-mild panvalvular regurgitation. No date: Dysarthria No date:  GERD (gastroesophageal reflux disease) No date: Hepatic steatosis No date: History of kidney stones No date: HLD (hyperlipidemia) No date: Hypertension No date: Kidney stone No date: Lumbar radiculopathy No date: Major depression in remission ([place]) No date: PVD (peripheral vascular disease) ([place]) 06/02/2015: Renal cyst, right     Comment:  a.) CT 06/02/2015 --> 4.4 cm RIGHT renal mass; MRI               recommended. b.) MRI 06/16/2015 --> 3.8 x 4.2 x 4.0 cm               proteinaceous/hemmorhagic cyst. No date: Subclavian steal syndrome     Comment:  a.) LHC 12/24/2014 --> tight eccentric 90% ostial               stenosis with damping. No date: T2DM (type 2 diabetes mellitus) ([place]) Past Surgical History: No date: ABDOMINAL HYSTERECTOMY 1980: AUGMENTATION MAMMAPLASTY; Bilateral 09/2018: BACK SURGERY 12/24/2014: CARDIAC CATHETERIZATION; Left     Comment:  Procedure: LEFT HEARD CATHETERIZATION; Location: [facility];               Surgeon: Laurence Aly, MD No date: CHOLECYSTECTOMY 02/11/2016: COLONOSCOPY; N/A     Comment:  Procedure: COLONOSCOPY;  Surgeon: Manya Silvas, MD;  Location: [facility] ENDOSCOPY;  Service: Endoscopy;                Laterality: N/A; BMI    Body Mass Index: 31.55 kg/m     Reproductive/Obstetrics negative OB ROS                            Anesthesia Physical Anesthesia Plan  ASA: 4  Anesthesia Plan: General ETT   Post-op Pain Management:  Regional for Post-op pain   Induction: Intravenous  PONV Risk Score and Plan:   Airway Management Planned: Video Laryngoscope Planned  Additional Equipment:   Intra-op Plan:   Post-operative Plan:   Informed Consent: I have reviewed the patients History and Physical, chart, labs and discussed the procedure including the risks, benefits and alternatives for the proposed anesthesia with the patient or authorized representative who has indicated his/her understanding and acceptance.      Dental Advisory Given  Plan Discussed with: CRNA  Anesthesia Plan Comments: (Role of CRNA discussed and accepted and plan for Post op pain block discussed and accepted. ja)       Anesthesia Quick Evaluation

## 2021-06-10 NOTE — Anesthesia Procedure Notes (Addendum)
Procedure Name: Intubation Date/Time: 06/10/2021 11:20 AM Performed by: Kerri Perches, CRNA Pre-anesthesia Checklist: Patient identified, Patient being monitored, Emergency Drugs available and Suction available Patient Re-evaluated:Patient Re-evaluated prior to induction Oxygen Delivery Method: Circle system utilized Preoxygenation: Pre-oxygenation with 100% oxygen Induction Type: IV induction Ventilation: Mask ventilation without difficulty Laryngoscope Size: 3 and McGraph Grade View: Grade I Tube type: Oral Tube size: 7.0 mm Number of attempts: 1 Airway Equipment and Method: Stylet Placement Confirmation: ETT inserted through vocal cords under direct vision, positive ETCO2 and breath sounds checked- equal and bilateral Secured at: 21 cm Tube secured with: Tape Dental Injury: Teeth and Oropharynx as per pre-operative assessment

## 2021-06-10 NOTE — Anesthesia Procedure Notes (Signed)
Anesthesia Regional Block: Interscalene brachial plexus block   Pre-Anesthetic Checklist: , timeout performed,  Correct Patient, Correct Site, Correct Laterality,  Correct Procedure, Correct Position, site marked,  Risks and benefits discussed,  Surgical consent,  Pre-op evaluation,  At surgeon's request and post-op pain management  Laterality: Right  Prep: chloraprep       Needles:  Injection technique: Single-shot  Needle Type: Echogenic Stimulator Needle     Needle Length: 10cm  Needle Gauge: 20     Additional Needles:   Procedures:,,,, ultrasound used (permanent image in chart),,    Narrative:  End time: 06/10/2021 3:07 PM Injection made incrementally with aspirations every 5 mL.  Performed by: Personally  Anesthesiologist: Molli Barrows, MD  Additional Notes: Functioning IV was confirmed and monitors were applied.Sterile prep and drape,hand hygiene and sterile gloves were used.  Negative aspiration and negative test dose prior to incremental administration of local anesthetic. The patient tolerated the procedure well.

## 2021-06-10 NOTE — TOC Progression Note (Signed)
Transition of Care [facility]) - Progression Note    Patient Details  Name: Cassidy Hernandez MRN: [id number] Date of Birth: 04/30/1941  Transition of [facility]) CM/SW [place], RN Phone Number: 06/10/2021, 4:21 PM  Clinical Narrative:   Received Notification that the patient would like to go to [facility] [place], Spoke with Magda Paganini at [facility], there are no beds available until Monday, I notified Dr Roland Rack and PA [place], Obtained PASSR number [id number], Fl2 to be completed once PT notes available. TOC to follow and assist with DC         Expected Discharge Plan and Services                                                 Social Determinants of Health (SDOH) Interventions    Readmission Risk Interventions No flowsheet data found.

## 2021-06-10 NOTE — Op Note (Signed)
06/10/2021  1:30 PM  Patient:   Cassidy Hernandez  Pre-Op Diagnosis:   Massive irreparable rotator cuff tear with early cuff arthropathy, right shoulder.  Post-Op Diagnosis:   Same.  Procedure:   Reverse right total shoulder arthroplasty with biceps tenodesis.  Surgeon:   Pascal Lux, MD  Assistant:   Cameron Proud, PA-C  Anesthesia:   General endotracheal with an interscalene block using Exparel placed preoperatively by the anesthesiologist.  Findings:   As above.  Complications:   None  EBL:   200 cc  Fluids:   1300 cc crystalloid  UOP:   None  TT:   None  Drains:   None  Closure:   Staples  Implants:   All press-fit Integra system with a 7 mm stem, a standard metaphyseal body, a +3 mm humeral platform, a mini baseplate, and a 38 mm concentric +2 mm laterally offset glenosphere.  Brief Clinical Note:   The patient is an 80 year old female with a 16-month history of right shoulder pain and weakness following an injury when she tripped over a throw rug in her home and fell onto her right shoulder. Her symptoms have persisted despite medications, activity modification, therapy, etc. Her history and examination consistent with a massive rotator cuff tear, confirmed by MRI. The patient presents at this time for a reverse right total shoulder arthroplasty.  Procedure:   The patient underwent placement of an interscalene block by the anesthesiologist in the preoperative holding area before being brought into the operating room and lain in the supine position. The patient then underwent general endotracheal intubation and anesthesia before the patient was repositioned in the beach chair position using the beach chair positioner. The right shoulder and upper extremity were prepped with ChloraPrep solution before being draped sterilely. Preoperative antibiotics were administered.   A timeout was performed to verify the appropriate surgical site before a standard anterior approach to the  shoulder was made through an approximately 4-5 inch incision. The incision was carried down through the subcutaneous tissues to expose the deltopectoral fascia. The interval between the deltoid and pectoralis muscles was identified and this plane developed, retracting the cephalic vein laterally with the deltoid muscle. The conjoined tendon was identified. Its lateral margin was dissected and the Kolbel self-retraining retractor inserted. The "three sisters" were identified and cauterized. Bursal tissues were removed to improve visualization.   The biceps tendon was identified near the inferior aspect of the bicipital groove. A soft tissue tenodesis was performed by attaching the biceps tendon to the adjacent pectoralis major tendon using two #0 Ethibond interrupted sutures. The biceps tendon was then transected just proximal to the tenodesis site. The subscapularis tendon was released from its attachment to the lesser tuberosity 1 cm proximal to its insertion and several tagging sutures placed. The inferior capsule was released with care after identifying and protecting the axillary nerve. The proximal humeral cut was made at approximately 20 of retroversion using the extra-medullary guide.   Attention was redirected to the glenoid. The labrum was debrided circumferentially before the center of the glenoid was identified. The guidewire was drilled into the glenoid neck using the appropriate guide. After verifying its position, it was overreamed with the mini-baseplate reamer to create a flat surface before the stem reamer was utilized. The superior and inferior peg sites were reamed using the appropriate guide to complete the glenoid preparation. The permanent mini-baseplate was impacted into place. It was stabilized with a 25 x 4.5 mm central screw and four  peripheral screws. Locking caps were placed over the superior and inferior screws. The permanent 38 mm concentric glenosphere with +2 mm of lateral offset  was then impacted into place and its Morse taper locking mechanism verified using manual distraction.  Attention was directed to the humeral side. The humeral canal was prepared utilizing the tapered stem reamers sequentially beginning with the 6 mm stem and progressing to a 7 mm stem. This demonstrated a good tight fit. The metaphyseal region was then prepared using the appropriate planar device. The trial stem and standard metaphyseal body were put together on the back table and a trial reduction performed using the +0 mm and +3 mm inserts. With the +3 mm insert, the arm demonstrated excellent range of motion as the hand could be brought across the chest to the opposite shoulder and brought to the top of the patient's head and to the patient's ear. The shoulder appeared stable throughout this range of motion. The joint was dislocated and the trial components removed. The permanent 7 mm stem with the standard body was impacted into place with care taken to maintain the appropriate version. A repeat trial reduction with the +3 mm insert again demonstrated excellent stability with the findings as described above. Therefore, the shoulder was re-dislocated and, after inserting the locking screw to secure the body to the stem, the permanent +3 mm insert impacted into place. After verifying its locking mechanism, the shoulder was relocated using two finger pressure and again placed through a range of motion with the findings as described above.  The wound was copiously irrigated with sterile saline solution using the jet lavage system before a total of 20 cc of Exparel diluted out to 60 cc with normal saline and 30 cc of 0.5% Sensorcaine with epinephrine was injected into the pericapsular and peri-incisional tissues to help with postoperative analgesia. The subscapularis tendon was reapproximated using #2 FiberWire interrupted sutures. The deltopectoral interval was closed using #0 Vicryl interrupted sutures before  the subcutaneous tissues were closed using 2-0 Vicryl interrupted sutures. The skin was closed using staples. Prior to closing the skin, 1 g of transexemic acid in 10 cc of normal saline was injected intra-articularly to help with postoperative bleeding. A sterile occlusive dressing was applied to the wound before the arm was placed into a shoulder immobilizer with an abduction pillow. A Polar Care system also was applied to the shoulder. The patient was then transferred back to a hospital bed before being awakened, extubated, and returned to the recovery room in satisfactory condition after tolerating the procedure well.

## 2021-06-11 ENCOUNTER — Encounter: Payer: Self-pay | Admitting: Surgery

## 2021-06-11 LAB — CBC
HCT: 41.3 % (ref 36.0–46.0)
Hemoglobin: 13.6 g/dL (ref 12.0–15.0)
MCH: 31 pg (ref 26.0–34.0)
MCHC: 32.9 g/dL (ref 30.0–36.0)
MCV: 94.1 fL (ref 80.0–100.0)
Platelets: 251 10*3/uL (ref 150–400)
RBC: 4.39 MIL/uL (ref 3.87–5.11)
RDW: 12.8 % (ref 11.5–15.5)
WBC: 18 10*3/uL — ABNORMAL HIGH (ref 4.0–10.5)
nRBC: 0 % (ref 0.0–0.2)

## 2021-06-11 LAB — BASIC METABOLIC PANEL
Anion gap: 11 (ref 5–15)
BUN: 20 mg/dL (ref 8–23)
CO2: 24 mmol/L (ref 22–32)
Calcium: 8.6 mg/dL — ABNORMAL LOW (ref 8.9–10.3)
Chloride: 104 mmol/L (ref 98–111)
Creatinine, Ser: 1.02 mg/dL — ABNORMAL HIGH (ref 0.44–1.00)
GFR, Estimated: 55 mL/min — ABNORMAL LOW (ref >60)
Glucose, Bld: 153 mg/dL — ABNORMAL HIGH (ref 70–99)
Potassium: 4.3 mmol/L (ref 3.5–5.1)
Sodium: 139 mmol/L (ref 135–145)

## 2021-06-11 MED ORDER — ASPIRIN 81 MG PO CHEW
CHEWABLE_TABLET | ORAL | Status: AC
  Administered 2021-06-11: 81 mg
  Filled 2021-06-11: qty 1

## 2021-06-11 MED ORDER — ENOXAPARIN SODIUM 40 MG/0.4ML IJ SOSY
PREFILLED_SYRINGE | INTRAMUSCULAR | Status: AC
  Administered 2021-06-11: 40 mg via SUBCUTANEOUS
  Filled 2021-06-11: qty 0.4

## 2021-06-11 MED ORDER — ACETAMINOPHEN 500 MG PO TABS
ORAL_TABLET | ORAL | Status: AC
  Filled 2021-06-11: qty 2

## 2021-06-11 MED ORDER — CEFAZOLIN SODIUM-DEXTROSE 2-4 GM/100ML-% IV SOLN
INTRAVENOUS | Status: AC
  Administered 2021-06-11: 2 g via INTRAVENOUS
  Filled 2021-06-11: qty 100

## 2021-06-11 MED ORDER — DOCUSATE SODIUM 100 MG PO CAPS: 100.0000 mg | ORAL_CAPSULE | Freq: Two times a day (BID) | ORAL | 0 refills | Status: DC

## 2021-06-11 MED ORDER — ONDANSETRON HCL 4 MG PO TABS: 4.0000 mg | ORAL_TABLET | Freq: Four times a day (QID) | ORAL | 0 refills | Status: DC | PRN

## 2021-06-11 MED ORDER — ASPIRIN EC 325 MG PO TBEC: 325.0000 mg | DELAYED_RELEASE_TABLET | Freq: Every day | ORAL | 0 refills | Status: DC

## 2021-06-11 MED ORDER — TRAMADOL HCL 50 MG PO TABS: 50.0000 mg | ORAL_TABLET | Freq: Four times a day (QID) | ORAL | 0 refills | Status: DC | PRN

## 2021-06-11 MED ORDER — OXYCODONE HCL 5 MG PO TABS: 5.0000 mg | ORAL_TABLET | ORAL | 0 refills | Status: DC | PRN

## 2021-06-11 MED ORDER — ACETAMINOPHEN 500 MG PO TABS
ORAL_TABLET | ORAL | Status: AC
  Administered 2021-06-11: 1000 mg via ORAL
  Filled 2021-06-11: qty 2

## 2021-06-11 NOTE — Progress Notes (Signed)
Physical Therapy Treatment Patient Details Name: Cassidy Hernandez MRN: [id number] DOB: 08/16/1940 Today's Date: 06/11/2021   History of Present Illness Pt is an 80 y.o. 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    PT Comments    Patient tolerated session well and was agreeable to treatment. Patient was resting in bed upon arrival. Patient was able to demonstrate improved distance with gait. Was able to progress from [place] A/ Left Hand held support to CGA at the waist. When patient starts to fatigue, hand held assist is required again for balance. Prior to ambulating, Ebony Hail, OT was called to fix sling immobilizer with abduction pillow to improve fit. 2L of O2 through nasal cannula throughout session and with activity (O2 was 94% prior to activity, 91% at conclusion of activity, and 93% at conclusion of session). Patient was educated on how to properly put nasal cannula on. Patient would continue to benefit from skilled physical therapy in order to improve strength and balance.    Recommendations for follow up therapy are one component of a multi-disciplinary discharge planning process, led by the attending physician.  Recommendations may be updated based on patient status, additional functional criteria and insurance authorization.  Follow Up Recommendations  Follow physician's recommendations for discharge plan and follow up therapies     Assistance Recommended at Discharge Frequent or constant Supervision/Assistance  Equipment Recommendations  Cane;3in1 (PT)    Recommendations for Other Services       Precautions / Restrictions Precautions Precautions: Fall;Shoulder Shoulder Interventions: Shoulder sling/immobilizer;Shoulder  abduction pillow Restrictions Weight Bearing Restrictions: Yes RUE Weight Bearing: Non weight bearing Other Position/Activity Restrictions: NWB through R UE or hand     Mobility  Bed Mobility Overal bed mobility: Needs Assistance Bed Mobility: Supine to Sit     Supine to sit: Supervision;HOB elevated Sit to supine: Min guard (required LUE support via L bed rail)   General bed mobility comments: increased effort/time to perform on own    Transfers Overall transfer level: Needs assistance Equipment used: 1 person hand held assist   Sit to Stand: Min guard           General transfer comment: verbal cueing to stay in bed until both patient and therapist were ready to stand; patient very eager to stand up    Ambulation/Gait Ambulation/Gait assistance:  (Min Assist/ L hand held support to start however patient progressed to just CGA at the waist after 185 feet. Following bathroom break, patient required L hand held assist again due to fatigue.) Gait Distance (Feet):  (x370, x25 feet to bathroom, x25 feet from bathroom to bed) Assistive device: 1 person hand held assist   Gait velocity: decreased   General Gait Details: wider BOS; decreased B LE step length; increased B lateral sway noted with making turns and when fatigued; mildly unsteady requiring single UE support; verbal cuing to be aware of external obstacles to prevent hitting RUE   Stairs             Wheelchair Mobility    Modified Rankin (Stroke Patients Only)       Balance Overall balance assessment: Needs assistance Sitting-balance support: Single extremity supported (LUE on bed rail) Sitting balance-Leahy Scale: Good     Standing balance support: Single extremity supported  Standing balance-Leahy Scale: Fair Standing balance comment: pt requiring at least single UE support for static standing balance                            Cognition Arousal/Alertness: Awake/alert Behavior During  Therapy: WFL for tasks assessed/performed Overall Cognitive Status: Within Functional Limits for tasks assessed                                          Exercises General Exercises - Lower Extremity Ankle Circles/Pumps: Supine;AROM;Both (30 reps) Short Arc Quad: Seated;AROM;Both;10 reps Hip Flexion/Marching: AROM;Strengthening;Both;10 reps;Seated Other Exercises Other Exercises: Standard BOS lateral head turns x10; LUE hand support and CGA at waist    General Comments        Pertinent Vitals/Pain Pain Assessment: No/denies pain    Home Living                          Prior Function            PT Goals (current goals can now be found in the care plan section) Acute Rehab PT Goals Patient Stated Goal: to improve strength and mobility PT Goal Formulation: With patient Time For Goal Achievement: 06/24/21 Potential to Achieve Goals: Good Progress towards PT goals: Progressing toward goals    Frequency    BID      PT Plan Current plan remains appropriate    Co-evaluation              AM-PAC PT "6 Clicks" Mobility   Outcome Measure  Help needed turning from your back to your side while in a flat bed without using bedrails?: A Little Help needed moving from lying on your back to sitting on the side of a flat bed without using bedrails?: A Little Help needed moving to and from a bed to a chair (including a wheelchair)?: A Little Help needed standing up from a chair using your arms (e.g., wheelchair or bedside chair)?: A Little Help needed to walk in hospital room?: A Little Help needed climbing 3-5 steps with a railing? : A Lot 6 Click Score: 17    End of Session Equipment Utilized During Treatment: Gait belt;Oxygen Activity Tolerance: Patient tolerated treatment well Patient left: in bed;with call bell/phone within reach;with bed alarm set;with SCD's reapplied;Other (comment) (B floating heels)   PT Visit Diagnosis: Unsteadiness  on feet (R26.81);Other abnormalities of gait and mobility (R26.89);Muscle weakness (generalized) (M62.81);History of falling (Z91.81);Pain     Time: 7482-7078 PT Time Calculation (min) (ACUTE ONLY): 43 min  Charges:  $Gait Training: 23-37 mins $Therapeutic Exercise: 8-22 mins                     Iva Boop, PT  06/11/21. 2:53 PM

## 2021-06-11 NOTE — TOC Progression Note (Addendum)
Transition of Care [facility]) - Progression Note    Patient Details  Name: Cassidy Hernandez MRN: [id number] Date of Birth: 02-11-1941  Transition of [facility]) CM/SW [place], LCSW Phone Number: 06/11/2021, 9:56 AM  Clinical Narrative:   Met with patient at bedside. Explained [facility] cannot take patient until Monday and patient would need to consider other SNFs that may be able to take her earlier. Patient is refusing other SNFs. Also refusing HH. Stated she was told she can wait at the hospital until Monday for [facility]. Updated MD, PA, TOC Supervisor.  Patient stated she has had 2 COVID vaccines + 2 boosters.  Magda Paganini with [facility] confirmed she will have a bed for patient on Monday. CSW started [place] today.   12:10- Call from [facility] with [facility] asking why the Josem Kaufmann was for Monday. Explained this is due to availability at patient's chosen SNF. She verbalized understanding.        Expected Discharge Plan and Services                                                 Social Determinants of Health (SDOH) Interventions    Readmission Risk Interventions No flowsheet data found.

## 2021-06-11 NOTE — Discharge Summary (Signed)
Physician Discharge Summary  Patient ID: Cassidy Hernandez MRN: [id number] DOB/AGE: 1941-01-11 80 y.o.  Admit date: 06/10/2021 Discharge date: 06/14/21  Admission Diagnoses:  Status post reverse arthroplasty of shoulder, right [Z96.611]  Discharge Diagnoses: Patient Active Problem List   Diagnosis Date Noted   Status post reverse arthroplasty of shoulder, right 06/10/2021   Past Medical History:  Diagnosis Date   Anginal pain ([place])    Aortic atherosclerosis    Cancer ([place])    Carotid artery stenosis 02/01/2010   a.) Doppler 88/89/1694: 50% LICA and 38% RICA. b.) Doppler 88/28/0034: >91% LICA and 79% RICA. c.) Doppler 10/25/16: >70% Bilater ICAs   Chronic bilateral low back pain with left-sided sciatica    CKD (chronic kidney disease), stage III (HCC)    Coronary artery disease    a.) LHC 12/24/2014 -> small LAD system; diffuse CTO of LAD. 60% RCA. LM and LCx with no sig disease. no intervention; med mgmt.   Degenerative arthritis    Diastolic dysfunction    a.) TTE 09/20/2018: EF 55%, G1DD, mild LAE and RVE, triv-mild panvalvular regurgitation.   Dysarthria    GERD (gastroesophageal reflux disease)    Hepatic steatosis    History of kidney stones    HLD (hyperlipidemia)    Hypertension    Kidney stone    Lumbar radiculopathy    Major depression in remission ([place])    PVD (peripheral vascular disease) ([place])    Renal cyst, right 06/02/2015   a.) CT 06/02/2015 --> 4.4 cm RIGHT renal mass; MRI recommended. b.) MRI 06/16/2015 --> 3.8 x 4.2 x 4.0 cm proteinaceous/hemmorhagic cyst.   Subclavian steal syndrome    a.) LHC 12/24/2014 --> tight eccentric 90% ostial stenosis with damping.   T2DM (type 2 diabetes mellitus) ([place])    Transfusion: None.   Consultants (if any):   Discharged Condition: Improved  Hospital Course: Cassidy Hernandez is an 80 y.o. female who was admitted 06/10/2021 with a diagnosis of a massive irreparable rotator cuff tear with early cuff arthropathy of the right  shoulder and went to the operating room on 06/10/2021 and underwent the above named procedures.    Surgeries: Procedure(s): REVERSE SHOULDER ARTHROPLASTY BICEPS TENODESIS on 06/10/2021 Patient tolerated the surgery well. Taken to PACU where she was stabilized and then transferred to the orthopedic floor.  Started on Lovenox 40mg  q 24 hrs. Foot pumps applied bilaterally at 80 mm. Heels elevated on bed with rolled towels. No evidence of DVT. Negative [place]. Physical therapy started on day #1 for gait training and transfer. OT started day #1 for ADL and assisted devices.  Patient's IV was removed on POD4  On POD4 insurance approval was obtained for discharge to SNF and the patient had meet goals for safe discharge.  Plan for discharge to [facility] today.  Patient did require supplemental O2 during her admission, required 2L during ambulation with therapy.  Will wean off prior to discharge today.  Implants: All press-fit Integra system with a 7 mm stem, a standard metaphyseal body, a +3 mm humeral platform, a mini baseplate, and a 38 mm concentric +2 mm laterally offset glenosphere.   She was given perioperative antibiotics:  Anti-infectives (From admission, onward)    Start     Dose/Rate Route Frequency Ordered Stop   06/10/21 1723  ceFAZolin (ANCEF) 2-4 GM/100ML-% IVPB       Note to Pharmacy: Tippett, Amy   : cabinet override      06/10/21 1723 06/10/21 1807   06/10/21  1700  ceFAZolin (ANCEF) IVPB 2g/100 mL premix        2 g 200 mL/hr over 30 Minutes Intravenous Every 6 hours 06/10/21 1600 06/11/21 0611   06/10/21 1023  ceFAZolin (ANCEF) 2-4 GM/100ML-% IVPB       Note to Pharmacy: Norton Blizzard  : cabinet override      06/10/21 1023 06/10/21 1807   06/10/21 0600  ceFAZolin (ANCEF) IVPB 2g/100 mL premix        2 g 200 mL/hr over 30 Minutes Intravenous On call to O.R. 06/10/21 0143 06/10/21 1122     .  She was given sequential compression devices, early ambulation, and  Lovenox for DVT prophylaxis.  She benefited maximally from the hospital stay and there were no complications.    Recent vital signs:  Vitals:   06/13/21 2316 06/14/21 0425  BP: 122/70 (!) 106/59  Pulse: 98 87  Resp:  20  Temp:  98.1 F (36.7 C)  SpO2: 96% 96%    Recent laboratory studies:  Lab Results  Component Value Date   HGB 11.4 (L) 06/12/2021   HGB 13.6 06/11/2021   HGB 13.5 06/03/2021   Lab Results  Component Value Date   WBC 15.9 (H) 06/12/2021   PLT 227 06/12/2021   No results found for: INR Lab Results  Component Value Date   NA 136 06/13/2021   K 4.2 06/13/2021   CL 100 06/13/2021   CO2 29 06/13/2021   BUN 33 (H) 06/13/2021   CREATININE 1.17 (H) 06/13/2021   GLUCOSE 137 (H) 06/13/2021   Discharge Medications:   Allergies as of 06/14/2021       Reactions   Sulfa Antibiotics Other (See Comments)        Medication List     TAKE these medications    acetaminophen 325 MG tablet Commonly known as: TYLENOL Take 650 mg by mouth every 8 (eight) hours as needed for moderate pain or mild pain (Back).   amLODipine 5 MG tablet Commonly known as: NORVASC Take 5 mg by mouth daily.   aspirin EC 325 MG tablet Take 1 tablet (325 mg total) by mouth daily. What changed:  medication strength how much to take   carvedilol 3.125 MG tablet Commonly known as: COREG Take 3.125 mg by mouth 2 (two) times daily.   chlorthalidone 25 MG tablet Commonly known as: HYGROTON Take 12.5 mg by mouth daily.   docusate sodium 100 MG capsule Commonly known as: COLACE Take 1 capsule (100 mg total) by mouth 2 (two) times daily.   escitalopram 10 MG tablet Commonly known as: LEXAPRO Take 10 mg by mouth daily.   ezetimibe 10 MG tablet Commonly known as: ZETIA Take 10 mg by mouth daily.   isosorbide mononitrate 60 MG 24 hr tablet Commonly known as: IMDUR Take 60 mg by mouth daily.   losartan 100 MG tablet Commonly known as: COZAAR Take 100 mg by mouth daily.    magnesium chloride 64 MG Tbec SR tablet Commonly known as: SLOW-MAG Take 1 tablet by mouth daily.   multivitamin capsule Take 1 capsule by mouth daily.   nitroGLYCERIN 0.4 MG SL tablet Commonly known as: NITROSTAT Place 0.4 mg under the tongue every 5 (five) minutes as needed for chest pain.   omeprazole 20 MG capsule Commonly known as: PRILOSEC Take 20 mg by mouth daily.   ondansetron 4 MG tablet Commonly known as: ZOFRAN Take 1 tablet (4 mg total) by mouth every 6 (six) hours as needed for  nausea.   oxyCODONE 5 MG immediate release tablet Commonly known as: Oxy IR/ROXICODONE Take 1-2 tablets (5-10 mg total) by mouth every 4 (four) hours as needed for moderate pain (pain score 4-6).   rosuvastatin 20 MG tablet Commonly known as: CRESTOR Take 20 mg by mouth daily.   traMADol 50 MG tablet Commonly known as: ULTRAM Take 1 tablet (50 mg total) by mouth every 6 (six) hours as needed for moderate pain.       Diagnostic Studies: DG Shoulder Right Port  Result Date: 06/10/2021 CLINICAL DATA:  Post surgery. EXAM: PORTABLE RIGHT SHOULDER COMPARISON:  MRI right shoulder dated January 22, 2021. FINDINGS: The right shoulder demonstrates a reverse total arthroplasty without evidence of hardware failure or complication. There is expected intra-articular air. There is no fracture or dislocation. The alignment is anatomic. Post-surgical changes noted in the surrounding soft tissues. IMPRESSION: 1. Interval right reverse total shoulder arthroplasty without acute postoperative complication. Electronically Signed   By: Titus Dubin M.D.   On: 06/10/2021 14:11   [place] OR NERVE BLOCK-IMAGE ONLY [facility])  Result Date: 06/10/2021 There is no interpretation for this exam.  This order is for images obtained during a surgical procedure.  Please See "Surgeries" Tab for more information regarding the procedure.    Disposition: Plan for discharge to [facility] later today pending COVID test.    Follow-up Information     Lattie Corns, PA-C Follow up in 14 day(s).   Specialty: Physician Assistant Why: Electa Sniff information: [place] [address] [phone]                Signed: Judson Roch PA-C 06/14/2021, 8:03 AM

## 2021-06-11 NOTE — NC FL2 (Signed)
[place] LEVEL OF CARE SCREENING TOOL     IDENTIFICATION  Patient Name: Cassidy Hernandez Birthdate: 05-11-1941 Sex: female Admission Date (Current Location): 06/10/2021  [facility] and [place] Number:  Engineering geologist and Address:  [facility], [address], [place], [place] [address]      Provider Number: [id number]  Attending Physician Name and Address:  Corky Mull, MD  Relative Name and Phone Number:  Maryelizabeth Kaufmann (Daughter)   [phone] [facility] Phone)    Current Level of Care: Hospital Recommended Level of Care: [place] Prior Approval Number:    Date Approved/Denied:   PASRR Number:    Discharge Plan:      Current Diagnoses: Patient Active Problem List   Diagnosis Date Noted   Status post reverse arthroplasty of shoulder, right 06/10/2021    Orientation RESPIRATION BLADDER Height & Weight     Self, Time, Situation, Place  O2 Continent Weight: 178 lb 1.4 oz (80.8 kg) Height:  5\' 3"  (160 cm)  BEHAVIORAL SYMPTOMS/MOOD NEUROLOGICAL BOWEL NUTRITION STATUS        Diet (heart diet, thin liquids)  AMBULATORY STATUS COMMUNICATION OF NEEDS Skin   Limited Assist Verbally Surgical wounds (R shoulder)                       Personal Care Assistance Level of Assistance  Bathing, Feeding, Dressing Bathing Assistance: Limited assistance Feeding assistance: Independent Dressing Assistance: Limited assistance     Functional Limitations Info             SPECIAL CARE FACTORS FREQUENCY  PT (By licensed PT), OT (By licensed OT)     PT Frequency: 5 times per week OT Frequency: 5 times per week            Contractures      Additional Factors Info  Code Status, Allergies Code Status Info: full Allergies Info: sulfa antibiotics           Current Medications (06/11/2021):  This is the current hospital active medication list Current Facility-Administered Medications  Medication Dose Route  Frequency Provider Last Rate Last Admin   0.9 %  sodium chloride infusion   Intravenous Continuous Poggi, Marshall Cork, MD 75 mL/hr at 06/10/21 1622 New Bag at 06/10/21 1622   acetaminophen (TYLENOL) tablet 325-650 mg  325-650 mg Oral Q6H PRN Poggi, Marshall Cork, MD       amLODipine (NORVASC) tablet 5 mg  5 mg Oral Daily Poggi, Marshall Cork, MD       aspirin EC tablet 81 mg  81 mg Oral Daily Poggi, Marshall Cork, MD       bisacodyl (DULCOLAX) suppository 10 mg  10 mg Rectal Daily PRN Poggi, Marshall Cork, MD       carvedilol (COREG) tablet 3.125 mg  3.125 mg Oral BID Corky Mull, MD   3.125 mg at 06/11/21 7096   chlorthalidone (HYGROTON) tablet 12.5 mg  12.5 mg Oral Daily Poggi, Marshall Cork, MD   12.5 mg at 06/11/21 0949   diphenhydrAMINE (BENADRYL) 12.5 MG/5ML elixir 12.5-25 mg  12.5-25 mg Oral Q4H PRN Poggi, Marshall Cork, MD       docusate sodium (COLACE) capsule 100 mg  100 mg Oral BID Corky Mull, MD   100 mg at 06/11/21 0937   enoxaparin (LOVENOX) injection 40 mg  40 mg Subcutaneous Q24H Poggi, Marshall Cork, MD   40 mg at 06/11/21 0954   escitalopram (LEXAPRO) tablet  10 mg  10 mg Oral Daily Poggi, Marshall Cork, MD   10 mg at 06/11/21 0943   ezetimibe (ZETIA) tablet 10 mg  10 mg Oral Daily Poggi, Marshall Cork, MD   10 mg at 06/11/21 0942   fentaNYL (SUBLIMAZE) injection 50 mcg  50 mcg Intravenous Once Molli Barrows, MD       isosorbide mononitrate (IMDUR) 24 hr tablet 60 mg  60 mg Oral Daily Poggi, Marshall Cork, MD   60 mg at 06/11/21 0942   losartan (COZAAR) tablet 100 mg  100 mg Oral Daily Poggi, Marshall Cork, MD       magnesium chloride (SLOW-MAG) 64 MG SR tablet 64 mg  1 tablet Oral Daily Poggi, Marshall Cork, MD   64 mg at 06/11/21 0941   magnesium hydroxide (MILK OF MAGNESIA) suspension 30 mL  30 mL Oral Daily PRN Poggi, Marshall Cork, MD       metoCLOPramide (REGLAN) tablet 5-10 mg  5-10 mg Oral Q8H PRN Poggi, Marshall Cork, MD       Or   metoCLOPramide (REGLAN) injection 5-10 mg  5-10 mg Intravenous Q8H PRN Poggi, Marshall Cork, MD       multivitamin with minerals tablet 1  tablet  1 tablet Oral Daily Poggi, Marshall Cork, MD   1 tablet at 06/11/21 0940   nitroGLYCERIN (NITROSTAT) SL tablet 0.4 mg  0.4 mg Sublingual Q5 min PRN Poggi, Marshall Cork, MD       ondansetron [facility]) tablet 4 mg  4 mg Oral Q6H PRN Poggi, Marshall Cork, MD       Or   ondansetron (ZOFRAN) injection 4 mg  4 mg Intravenous Q6H PRN Poggi, Marshall Cork, MD       oxyCODONE (Oxy IR/ROXICODONE) immediate release tablet 5-10 mg  5-10 mg Oral Q4H PRN Poggi, Marshall Cork, MD       pantoprazole (PROTONIX) EC tablet 40 mg  40 mg Oral Daily Poggi, Marshall Cork, MD   40 mg at 06/11/21 0940   rosuvastatin (CRESTOR) tablet 20 mg  20 mg Oral QHS Poggi, Marshall Cork, MD   20 mg at 06/10/21 2207   sodium phosphate (FLEET) 7-19 GM/118ML enema 1 enema  1 enema Rectal Once PRN Poggi, Marshall Cork, MD       traMADol Veatrice [place]bon) tablet 50 mg  50 mg Oral Q6H PRN Poggi, Marshall Cork, MD         Discharge Medications: Please see discharge summary for a list of discharge medications.  Relevant Imaging Results:  Relevant Lab Results:   Additional Information SS #: [id number]  [place], LCSW

## 2021-06-11 NOTE — Evaluation (Signed)
Occupational Therapy Evaluation Patient Details Name: Cassidy Hernandez MRN: [id number] DOB: 1940-09-24 Today's Date: 06/11/2021   History of Present Illness Pt is an 80 y.o. 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   Clinical Impression   Pt seen for OT evaluation this date in setting of acute hospitalization s/p reverse R TSA. Pt reports being INDEP and living alone at baseline. She presents this date requiring increased assist for LB and UB ADLs including MIN/MOD A to don a shirt and MOD A to don socks. Pt tolerates session well. Stands with CGA with no LOB and hand held assist. She is somewhat groggy from sx still, but overall appropriate. OT provides below-listed education and provides handout. Pt requires MAX A to manage polar care and sling and will require frequent assistance while recovering from surgery as well as to improve INDEP with self care. Recommend pt d/c to STR.      Recommendations for follow up therapy are one component of a multi-disciplinary discharge planning process, led by the attending physician.  Recommendations may be updated based on patient status, additional functional criteria and insurance authorization.   Follow Up Recommendations  Skilled nursing-short term rehab (<3 hours/day)    Assistance Recommended at Discharge Intermittent Supervision/Assistance  Functional Status Assessment  Patient has had a recent decline in their functional status and demonstrates the ability to make significant improvements in function in a reasonable and predictable amount of time.  Equipment Recommendations  None recommended by OT    Recommendations for Other Services       Precautions / Restrictions  Precautions Precautions: Fall;Shoulder Shoulder Interventions: Shoulder sling/immobilizer;Shoulder abduction pillow Restrictions Weight Bearing Restrictions: Yes RUE Weight Bearing: Non weight bearing Other Position/Activity Restrictions: NWB through R UE or hand      Mobility Bed Mobility Overal bed mobility: Needs Assistance Bed Mobility: Supine to Sit;Sit to Supine     Supine to sit: Supervision Sit to supine: Min assist   General bed mobility comments: increasd time    Transfers Overall transfer level: Needs assistance Equipment used: 1 person hand held assist Transfers: Sit to/from Stand Sit to Stand: Min guard           General transfer comment: increased time, cues for safety      Balance Overall balance assessment: Needs assistance Sitting-balance support: Single extremity supported;Feet supported Sitting balance-Leahy Scale: Good     Standing balance support: Single extremity supported Standing balance-Leahy Scale: Fair Standing balance comment: some light sway, no gross LOB                           ADL either performed or assessed with clinical judgement   ADL                                         General ADL Comments: MIN A for seated UB and LB ADLs, MAX A to apply polar care and sling     Vision Patient Visual Report: No change from baseline       Perception     Praxis      Pertinent Vitals/Pain Pain Assessment: No/denies pain (  still somewhat numb, but able to move)     Hand Dominance     Extremity/Trunk Assessment Upper Extremity Assessment Upper Extremity Assessment: Defer to OT evaluation;RUE deficits/detail;LUE deficits/detail RUE Deficits / Details: R UE in shoulder immobilizer with abduction pillow, able to flex/ext digits, sup/pronate wrist, flex/ext wrist, sensation returning. LUE Deficits / Details: WFL   Lower Extremity Assessment Lower Extremity Assessment: Defer to PT evaluation;Overall  WFL for tasks assessed;Generalized weakness       Communication Communication Communication: No difficulties   Cognition Arousal/Alertness: Awake/alert Behavior During Therapy: WFL for tasks assessed/performed Overall Cognitive Status: Within Functional Limits for tasks assessed                                 General Comments: somewhat drowsy from anesthesia     General Comments       Exercises General Exercises - Lower Extremity Ankle Circles/Pumps: Supine;AROM;Both (30 reps) Short Arc Quad: Seated;AROM;Both;10 reps Hip Flexion/Marching: AROM;Strengthening;Both;10 reps;Seated Other Exercises Other Exercises: ed re: modified techniques, polar care and sling application, hand, writs, digit ROM.   Shoulder Instructions      Home Living Family/patient expects to be discharged to:: Private residence Living Arrangements: Alone Available Help at Discharge: Family;Available PRN/intermittently (pt's sister) Type of Home: House Home Access: Level entry     Home Layout: One level     Bathroom Shower/Tub: Teacher, early years/pre: Handicapped height     Home Equipment: Grab bars - tub/shower          Prior Functioning/Environment Prior Level of Function : Independent/Modified Independent             Mobility Comments: Fall on May 7th 2022 (causing R shoulder issues)          OT Problem List: Decreased strength;Decreased range of motion;Decreased activity tolerance;Decreased knowledge of use of DME or AE;Impaired UE functional use      OT Treatment/Interventions: Self-care/ADL training;Therapeutic exercise;DME and/or AE instruction;Therapeutic activities;Patient/family education;Balance training    OT Goals(Current goals can be found in the care plan section) Acute Rehab OT Goals Patient Stated Goal: to get stronger at rehab, then go home OT Goal Formulation: With patient Time For Goal Achievement: 06/25/21 Potential to Achieve Goals:  Good ADL Goals Pt Will Perform Upper Body Bathing: with set-up;with supervision;sitting (seesaw) Pt Will Perform Upper Body Dressing: with modified independence;sitting Pt Will Perform Lower Body Dressing: with supervision;sitting/lateral leans (using one hand technique to don sock) Pt Will Transfer to Toilet: with supervision;ambulating Pt Will Perform Toileting - Clothing Manipulation and hygiene: with supervision;sitting/lateral leans (with modified use of L hand despite being R handed) Pt/caregiver will Perform Home Exercise Program: Increased ROM;Right Upper extremity;With Supervision (wrist, hand, digits)  OT Frequency: Min 2X/week   Barriers to D/C:            Co-evaluation              AM-PAC OT "6 Clicks" Daily Activity     Outcome Measure Help from another person eating meals?: A Little Help from another person taking care of personal grooming?: A Little Help from another person toileting, which includes using toliet, bedpan, or urinal?: A Little Help from another person bathing (including washing, rinsing, drying)?: A Little Help from another person to put on and taking off regular upper body clothing?: A Little Help from another person to put on and taking off regular lower body clothing?: A Little 6  Click Score: 18   End of Session Equipment Utilized During Treatment: Gait belt;Other (comment) (sling) Nurse Communication: Mobility status  Activity Tolerance: Patient tolerated treatment well Patient left: in bed;with call bell/phone within reach  OT Visit Diagnosis: Unsteadiness on feet (R26.81);History of falling (Z91.81)                Time: 0721-8288 OT Time Calculation (min): 35 min Charges:  OT General Charges $OT Visit: 1 Visit OT Evaluation $OT Eval Moderate Complexity: 1 Mod OT Treatments $Self Care/Home Management : 8-22 mins $Therapeutic Activity: 8-22 mins  Gerrianne Scale, MS, OTR/L ascom [phone] 06/11/21, 6:01 PM

## 2021-06-11 NOTE — Progress Notes (Signed)
Physical Therapy Treatment Patient Details Name: Cassidy Hernandez MRN: [id number] DOB: 1940-12-08 Today's Date: 06/11/2021   History of Present Illness Pt is an 80 y.o. 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    PT Comments    Pt resting in bed upon PT arrival; agreeable to PT session and requesting to toilet.  SBA semi-supine to sitting edge of bed; min assist with transfers; min assist/hand hold assist to ambulate 25 feet x2 (to bathroom and back) and then to ambulate 110 feet with hand hold assist (pt unsteady requiring hand hold assist for balance).  O2 sats 91-92% on 2 L O2 via nasal cannula with activity and 93% at rest (on 2 L) end of session.  Will continue to focus on strengthening, balance, and progressive functional mobility during hospitalization.    Recommendations for follow up therapy are one component of a multi-disciplinary discharge planning process, led by the attending physician.  Recommendations may be updated based on patient status, additional functional criteria and insurance authorization.  Follow Up Recommendations  Follow physician's recommendations for discharge plan and follow up therapies     Assistance Recommended at Discharge Frequent or constant Supervision/Assistance  Equipment Recommendations  Cane;3in1 (PT)    Recommendations for Other Services OT consult     Precautions / Restrictions Precautions Precautions: Fall;Shoulder Shoulder Interventions: Shoulder sling/immobilizer;Shoulder abduction pillow Restrictions Weight Bearing Restrictions: Yes RUE Weight Bearing: Non weight bearing Other Position/Activity Restrictions: NWB through R UE or hand     Mobility  Bed Mobility Overal  bed mobility: Needs Assistance Bed Mobility: Supine to Sit     Supine to sit: Supervision;HOB elevated     General bed mobility comments: increased effort/time to perform on own    Transfers Overall transfer level: Needs assistance Equipment used: 1 person hand held assist Transfers: Sit to/from [facility] Sit to Stand: Min assist Stand pivot transfers: Min assist         General transfer comment: L hand hold assist; vc's for technique; x2 trials from bed and x1 trial from toilet    Ambulation/Gait Ambulation/Gait assistance: Min assist Gait Distance (Feet):  (25 feet x2; 110 feet) Assistive device: 1 person hand held assist   Gait velocity: decreased   General Gait Details: wider BOS; decreased B LE step length; increased B lateral sway; mildly unsteady requiring single UE support   Stairs             Wheelchair Mobility    Modified Rankin (Stroke Patients Only)       Balance Overall balance assessment: Needs assistance Sitting-balance support: No upper extremity supported;Feet supported Sitting balance-Leahy Scale: Good Sitting balance - Comments: steady sitting reaching within BOS with L UE   Standing balance support: Single extremity supported Standing balance-Leahy Scale: Fair Standing balance comment: pt requiring at least single UE support for static standing balance                            Cognition Arousal/Alertness: Awake/alert Behavior During Therapy: WFL for tasks assessed/performed Overall Cognitive Status: Within Functional Limits for tasks assessed  Exercises General Exercises - Lower Extremity Ankle Circles/Pumps: AROM;Strengthening;Both;10 reps;Seated Long Arc Quad: AROM;Strengthening;Both;10 reps;Seated Hip Flexion/Marching: AROM;Strengthening;Both;10 reps;Seated    General Comments General comments (skin integrity, edema, etc.): R UE in  shoulder sling/immobilizer/abduction pillow.  Nursing cleared pt for participation in physical therapy.  Pt agreeable to PT session.      Pertinent Vitals/Pain Pain Assessment: No/denies pain HR WFL during sessions activities.    Home Living                          Prior Function            PT Goals (current goals can now be found in the care plan section) Acute Rehab PT Goals Patient Stated Goal: to improve strength and mobility PT Goal Formulation: With patient Time For Goal Achievement: 06/24/21 Potential to Achieve Goals: Good Progress towards PT goals: Progressing toward goals    Frequency    BID      PT Plan Current plan remains appropriate    Co-evaluation              AM-PAC PT "6 Clicks" Mobility   Outcome Measure  Help needed turning from your back to your side while in a flat bed without using bedrails?: A Little Help needed moving from lying on your back to sitting on the side of a flat bed without using bedrails?: A Little Help needed moving to and from a bed to a chair (including a wheelchair)?: A Little Help needed standing up from a chair using your arms (e.g., wheelchair or bedside chair)?: A Little Help needed to walk in hospital room?: A Little Help needed climbing 3-5 steps with a railing? : A Lot 6 Click Score: 17    End of Session Equipment Utilized During Treatment: Gait belt;Other (comment) (R shoulder immobilizer/abduction pillow) Activity Tolerance: Patient tolerated treatment well Patient left: in chair;with call bell/phone within reach;with nursing/sitter in room;with SCD's reapplied;Other (comment) (B heels floating via pillow support) Nurse Communication: Mobility status;Precautions;Other (comment) (pt's O2 sats during session) PT Visit Diagnosis: Unsteadiness on feet (R26.81);Other abnormalities of gait and mobility (R26.89);Muscle weakness (generalized) (M62.81);History of falling (Z91.81);Pain Pain - Right/Left:  Right Pain - part of body: Shoulder     Time: 3875-6433 PT Time Calculation (min) (ACUTE ONLY): 38 min  Charges:  $Gait Training: 8-22 mins $Therapeutic Exercise: 8-22 mins $Therapeutic Activity: 8-22 mins                     Leitha Bleak, PT 06/11/21, 10:35 AM

## 2021-06-11 NOTE — Progress Notes (Signed)
Subjective: 1 Day Post-Op Procedure(s) (LRB): REVERSE SHOULDER ARTHROPLASTY (Right) BICEPS TENODESIS Patient reports pain as mild.   Patient is well, and has had no acute complaints or problems PT and care management to assist with discharge planning. Patient lives at home alone, hopeful to discharge to [facility]. Negative for chest pain and shortness of breath Fever: no Gastrointestinal:Negative for nausea and vomiting Patient currenly on 2L of O2, current SpO2 at 96%  Objective: Vital signs in last 24 hours: Temp:  [96.8 F (36 C)-98.1 F (36.7 C)] 97.6 F (36.4 C) (11/04 0743) Pulse Rate:  [78-102] 91 (11/04 0743) Resp:  [12-29] 15 (11/04 0743) BP: (95-148)/(52-83) 132/74 (11/04 0743) SpO2:  [91 %-100 %] 96 % (11/04 0743) Weight:  [80.8 kg] 80.8 kg (11/03 1003)  Intake/Output from previous day:  Intake/Output Summary (Last 24 hours) at 06/11/2021 0754 Last data filed at 06/11/2021 0715 Gross per 24 hour  Intake 2400 ml  Output 1000 ml  Net 1400 ml    Intake/Output this shift: Total I/O In: -  Out: 150 [Urine:150]  Labs: Recent Labs    06/11/21 0652  HGB 13.6   Recent Labs    06/11/21 0652  WBC 18.0*  RBC 4.39  HCT 41.3  PLT 251   Recent Labs    06/11/21 0619  NA 139  K 4.3  CL 104  CO2 24  BUN 20  CREATININE 1.02*  GLUCOSE 153*  CALCIUM 8.6*   No results for input(s): LABPT, INR in the last 72 hours.   EXAM General - Patient is Alert, Appropriate, and Oriented Extremity - ABD soft Incision: dressing C/D/I No cellulitis present Patient is flexing and extending all fingers to the right hand without pain, intact wrist flexion and extension as well. Patient reports she is intact to light touch over the lateral aspect of the deltoid. Honeycomb dressing intact without any drainage noted. Abdomen soft with normal bowel sounds.  Past Medical History:  Diagnosis Date   Anginal pain ([place])    Aortic atherosclerosis    Cancer ( [place])     Carotid artery stenosis 02/01/2010   a.) Doppler 05/24/5101: 58% LICA and 52% RICA. b.) Doppler 77/82/4235: >36% LICA and 14% RICA. c.) Doppler 10/25/16: >70% Bilater ICAs   Chronic bilateral low back pain with left-sided sciatica    CKD (chronic kidney disease), stage III (HCC)    Coronary artery disease    a.) LHC 12/24/2014 -> small LAD system; diffuse CTO of LAD. 60% RCA. LM and LCx with no sig disease. no intervention; med mgmt.   Degenerative arthritis    Diastolic dysfunction    a.) TTE 09/20/2018: EF 55%, G1DD, mild LAE and RVE, triv-mild panvalvular regurgitation.   Dysarthria    GERD (gastroesophageal reflux disease)    Hepatic steatosis    History of kidney stones    HLD (hyperlipidemia)    Hypertension    Kidney stone    Lumbar radiculopathy    Major depression in remission ([place])    PVD (peripheral vascular disease) ([place])    Renal cyst, right 06/02/2015   a.) CT 06/02/2015 --> 4.4 cm RIGHT renal mass; MRI recommended. b.) MRI 06/16/2015 --> 3.8 x 4.2 x 4.0 cm proteinaceous/hemmorhagic cyst.   Subclavian steal syndrome    a.) LHC 12/24/2014 --> tight eccentric 90% ostial stenosis with damping.   T2DM (type 2 diabetes mellitus) (HCC)     Assessment/Plan: 1 Day Post-Op Procedure(s) (LRB): REVERSE SHOULDER ARTHROPLASTY (Right) BICEPS TENODESIS Active Problems:   Status  post reverse arthroplasty of shoulder, right  Estimated body mass index is 31.55 kg/m as calculated from the following:   Height as of this encounter: 5\' 3"  (1.6 m).   Weight as of this encounter: 80.8 kg. Advance diet Up with therapy D/C IV fluids when tolerating po intake.  Labs reviewed, WBC 18 this AM, likely reactive from surgery. Patient currently on supplemental O2, wean off today as tolerated. Up with therapy today.  Patient is hopeful for discharge to [facility]. Per care management, bed not available until Monday.  Will see how she performs with therapy, if she performs well enough she  may be able to discharge home however she does live on her own.  DVT Prophylaxis - Lovenox, Foot Pumps, and TED hose Non-weightbearing to the right arm.  Raquel Lamine Laton, PA-C [facility] 06/11/2021, 7:54 AM

## 2021-06-11 NOTE — Discharge Instructions (Signed)
Diet: As you were doing prior to hospitalization   Shower:  May shower but keep the wounds dry, use an occlusive plastic wrap, NO SOAKING IN TUB.  If the bandage gets wet, change with a clean dry gauze.  Dressing:  You may change your dressing as needed. Change the dressing with sterile gauze dressing.    Activity:  Increase activity slowly as tolerated, but follow the weight bearing instructions below.  No lifting or driving for 6 weeks.  Weight Bearing:   Non-weightbearing to the right arm.  Blood Clot Prevention: Take 1 325mg  aspirin daily following discharge  To prevent constipation: you may use a stool softener such as -  Colace (over the counter) 100 mg by mouth twice a day  Drink plenty of fluids (prune juice may be helpful) and high fiber foods Miralax (over the counter) for constipation as needed.    Itching:  If you experience itching with your medications, try taking only a single pain pill, or even half a pain pill at a time.  You may take up to 10 pain pills per day, and you can also use benadryl over the counter for itching or also to help with sleep.   Precautions:  If you experience chest pain or shortness of breath - call 911 immediately for transfer to the hospital emergency department!!  If you develop a fever greater that 101 F, purulent drainage from wound, increased redness or drainage from wound, or calf pain-Call [facility]                                              Follow- Up Appointment:  Please call for an appointment to be seen in 2 weeks at [facility]

## 2021-06-12 LAB — CBC
HCT: 34.1 % — ABNORMAL LOW (ref 36.0–46.0)
Hemoglobin: 11.4 g/dL — ABNORMAL LOW (ref 12.0–15.0)
MCH: 31.1 pg (ref 26.0–34.0)
MCHC: 33.4 g/dL (ref 30.0–36.0)
MCV: 92.9 fL (ref 80.0–100.0)
Platelets: 227 10*3/uL (ref 150–400)
RBC: 3.67 MIL/uL — ABNORMAL LOW (ref 3.87–5.11)
RDW: 13 % (ref 11.5–15.5)
WBC: 15.9 10*3/uL — ABNORMAL HIGH (ref 4.0–10.5)
nRBC: 0 % (ref 0.0–0.2)

## 2021-06-12 LAB — BASIC METABOLIC PANEL
Anion gap: 8 (ref 5–15)
BUN: 25 mg/dL — ABNORMAL HIGH (ref 8–23)
CO2: 28 mmol/L (ref 22–32)
Calcium: 8.9 mg/dL (ref 8.9–10.3)
Chloride: 101 mmol/L (ref 98–111)
Creatinine, Ser: 0.99 mg/dL (ref 0.44–1.00)
GFR, Estimated: 57 mL/min — ABNORMAL LOW (ref >60)
Glucose, Bld: 128 mg/dL — ABNORMAL HIGH (ref 70–99)
Potassium: 4.3 mmol/L (ref 3.5–5.1)
Sodium: 137 mmol/L (ref 135–145)

## 2021-06-12 NOTE — Progress Notes (Signed)
Subjective: 2 Days Post-Op Procedure(s) (LRB): REVERSE SHOULDER ARTHROPLASTY (Right) BICEPS TENODESIS Patient reports pain as mild.   Patient is well, and has had no acute complaints or problems PT and care management to assist with discharge planning. Patient lives at home alone, hopeful to discharge to [facility] Monday Negative for chest pain and shortness of breath Fever: no Gastrointestinal:Negative for nausea and vomiting  Objective: Vital signs in last 24 hours: Temp:  [97.4 F (36.3 C)-97.7 F (36.5 C)] 97.7 F (36.5 C) (11/05 0535) Pulse Rate:  [90-100] 90 (11/05 0535) Resp:  [15-20] 16 (11/05 0535) BP: (100-132)/(63-74) 127/70 (11/05 0535) SpO2:  [94 %-96 %] 94 % (11/05 0535)  Intake/Output from previous day:  Intake/Output Summary (Last 24 hours) at 06/12/2021 0658 Last data filed at 06/11/2021 1705 Gross per 24 hour  Intake 809.94 ml  Output 350 ml  Net 459.94 ml    Intake/Output this shift: No intake/output data recorded.  Labs: Recent Labs    06/11/21 0652 06/12/21 0352  HGB 13.6 11.4*   Recent Labs    06/11/21 0652 06/12/21 0352  WBC 18.0* 15.9*  RBC 4.39 3.67*  HCT 41.3 34.1*  PLT 251 227   Recent Labs    06/11/21 0619 06/12/21 0352  NA 139 137  K 4.3 4.3  CL 104 101  CO2 24 28  BUN 20 25*  CREATININE 1.02* 0.99  GLUCOSE 153* 128*  CALCIUM 8.6* 8.9   No results for input(s): LABPT, INR in the last 72 hours.   EXAM General - Patient is Alert, Appropriate, and Oriented Extremity - ABD soft Incision: dressing C/D/I No cellulitis present Patient is flexing and extending all fingers to the right hand without pain, intact wrist flexion and extension as well. Patient reports she is intact to light touch over the lateral aspect of the deltoid. Honeycomb dressing intact without any drainage noted.   Past Medical History:  Diagnosis Date   Anginal pain ([place])    Aortic atherosclerosis    Cancer ([place])    Carotid artery  stenosis 02/01/2010   a.) Doppler 62/26/3335: 45% LICA and 62% RICA. b.) Doppler 56/38/9373: >42% LICA and 87% RICA. c.) Doppler 10/25/16: >70% Bilater ICAs   Chronic bilateral low back pain with left-sided sciatica    CKD (chronic kidney disease), stage III (HCC)    Coronary artery disease    a.) LHC 12/24/2014 -> small LAD system; diffuse CTO of LAD. 60% RCA. LM and LCx with no sig disease. no intervention; med mgmt.   Degenerative arthritis    Diastolic dysfunction    a.) TTE 09/20/2018: EF 55%, G1DD, mild LAE and RVE, triv-mild panvalvular regurgitation.   Dysarthria    GERD (gastroesophageal reflux disease)    Hepatic steatosis    History of kidney stones    HLD (hyperlipidemia)    Hypertension    Kidney stone    Lumbar radiculopathy    Major depression in remission ([place])    PVD (peripheral vascular disease) ([place])    Renal cyst, right 06/02/2015   a.) CT 06/02/2015 --> 4.4 cm RIGHT renal mass; MRI recommended. b.) MRI 06/16/2015 --> 3.8 x 4.2 x 4.0 cm proteinaceous/hemmorhagic cyst.   Subclavian steal syndrome    a.) LHC 12/24/2014 --> tight eccentric 90% ostial stenosis with damping.   T2DM (type 2 diabetes mellitus) (HCC)     Assessment/Plan: 2 Days Post-Op Procedure(s) (LRB): REVERSE SHOULDER ARTHROPLASTY (Right) BICEPS TENODESIS Active Problems:   Status post reverse arthroplasty of shoulder, right  Estimated body mass index is 31.55 kg/m as calculated from the following:   Height as of this encounter: 5\' 3"  (1.6 m).   Weight as of this encounter: 80.8 kg. Advance diet Up with therapy D/C IV fluids when tolerating po intake.  Labs reviewed, WBC 15.9, trending down.  Vital signs are stable  Up with therapy today.  Patient is hopeful for discharge to [facility]. Per care management, bed not available until Monday.  Will see how she performs with therapy, if she performs well enough she may be able to discharge home however she does live on her own and we  anticipate patient will need to go to rehab.  DVT Prophylaxis - Lovenox, Foot Pumps, and TED hose Non-weightbearing to the right arm.  Ronney Asters, PA-C [facility] 06/12/2021, 6:58 AM

## 2021-06-12 NOTE — Progress Notes (Signed)
Occupational Therapy Treatment Patient Details Name: Cassidy Hernandez MRN: [id number] DOB: [birth year] Today's Date: 06/12/2021   History of present illness Pt is an 80 y.o. 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   OT comments  Pt seen for OT tx this date to f/u re: safety with ADLs/ADL mobility. Pt up to chair pre/post session and requests to stay up to chair to finish lunch. OT engages pt in wrist pro/sup x10, flex/ext x10, digit flex/ext x10 and walks pt through sling application and removal with 2 demosntartions. Pt still requries MOD assist to don sling. OT will continue to progress pt with self care ADLs and continue to educate modified techniques give post-op status to L shoulder. OT will continue to follow.    Recommendations for follow up therapy are one component of a multi-disciplinary discharge planning process, led by the attending physician.  Recommendations may be updated based on patient status, additional functional criteria and insurance authorization.    Follow Up Recommendations  Skilled nursing-short term rehab (<3 hours/day)    Assistance Recommended at Discharge Intermittent Supervision/Assistance  Equipment Recommendations  None recommended by OT    Recommendations for Other Services      Precautions / Restrictions Precautions Precautions: Fall;Shoulder Shoulder Interventions: Shoulder sling/immobilizer;Shoulder abduction pillow Restrictions Weight Bearing Restrictions: Yes RUE Weight Bearing: Non weight bearing       Mobility Bed Mobility Overal bed mobility: Needs Assistance Bed Mobility: Supine to Sit;Sit to Supine     Supine to sit: Supervision Sit to supine: Min assist   General bed  mobility comments: deferred, up to chair pre/post    Transfers Overall transfer level: Needs assistance Equipment used: 1 person hand held assist Transfers: Sit to/from Stand Sit to Stand: Min assist           General transfer comment: deferred     Balance Overall balance assessment: Needs assistance Sitting-balance support: Single extremity supported;Feet supported Sitting balance-Leahy Scale: Good Sitting balance - Comments: deferred, up to chair   Standing balance support: Single extremity supported Standing balance-Leahy Scale: Fair Standing balance comment: deferred                           ADL either performed or assessed with clinical judgement   ADL Overall ADL's : Needs assistance/impaired                 Upper Body Dressing : Moderate assistance;Sitting Upper Body Dressing Details (indicate cue type and reason): cues, education, demonstration re: modified technique                         Vision Patient Visual Report: No change from baseline     Perception     Praxis      Cognition Arousal/Alertness: Awake/alert Behavior During Therapy: WFL for tasks assessed/performed Overall Cognitive Status: Within Functional Limits for tasks assessed                                 General Comments: Pt is A and O x 4 and cooperative and pleasant          Exercises Other Exercises  Other Exercises: OT engages pt in wrist pro/sup x10, flex/ext x10, digit flex/ext x10 and walks pt through sling application and removal with 2 demosntartions. Pt still requries MOD assist to don sling.   Shoulder Instructions       General Comments      Pertinent Vitals/ Pain       Pain Assessment: 0-10 Pain Score: 6  Pain Location: R shoulder Pain Descriptors / Indicators: Burning;Discomfort;Grimacing;Guarding Pain Intervention(s): Limited activity within patient's tolerance;Monitored during session;Premedicated before  session  Home Living                                          Prior Functioning/Environment              Frequency  Min 2X/week        Progress Toward Goals  OT Goals(current goals can now be found in the care plan section)  Progress towards OT goals: Progressing toward goals  Acute Rehab OT Goals Patient Stated Goal: get stronger at rehab and then go home OT Goal Formulation: With patient Time For Goal Achievement: 06/25/21 Potential to Achieve Goals: Good  Plan Discharge plan remains appropriate    Co-evaluation                 AM-PAC OT "6 Clicks" Daily Activity     Outcome Measure   Help from another person eating meals?: A Little Help from another person taking care of personal grooming?: A Little Help from another person toileting, which includes using toliet, bedpan, or urinal?: A Little Help from another person bathing (including washing, rinsing, drying)?: A Little Help from another person to put on and taking off regular upper body clothing?: A Lot Help from another person to put on and taking off regular lower body clothing?: A Little 6 Click Score: 17    End of Session Equipment Utilized During Treatment:  (sling)  OT Visit Diagnosis: History of falling (Z91.81);Pain Pain - Right/Left: Right Pain - part of body: Shoulder   Activity Tolerance Patient tolerated treatment well   Patient Left in bed;with call bell/phone within reach   Nurse Communication Mobility status        Time: 2458-0998 OT Time Calculation (min): 14 min  Charges: OT General Charges $OT Visit: 1 Visit OT Treatments $Self Care/Home Management : 8-22 mins  Gerrianne Scale, MS, OTR/L ascom [phone] 06/12/21, 2:29 PM

## 2021-06-12 NOTE — Progress Notes (Signed)
Physical Therapy Treatment Patient Details Name: Cassidy Hernandez MRN: [id number] DOB: [birth year] Today's Date: 06/12/2021   History of Present Illness Pt is an 80 y.o. 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    PT Comments    Pt was long sitting in bed with supportive daughter at bedside. Discussed need for assistance at DC however daughter confirms she will not have any. Acute PT  recommends DC to SNF to address deficits while maximizing independence with ADLs. Rehab prior to returning home I'ly seems appropriate. Daughter did state that pt can come stay at her house after Dc'd from rehab. She requires assistance to exit bed safely, stand with +1 LUE support, and ambulate 400 ft with HHA +1. Pt does have unsteadiness noted. Poor balance observed without at least +1 UE support. She continues to require O2 even when attempted to wean to rm air. She returned to 2 L O2 [place] and was repositioned back into bed post session. Acute PT will continue to follow and progress pt as able per current POC.    Recommendations for follow up therapy are one component of a multi-disciplinary discharge planning process, led by the attending physician.  Recommendations may be updated based on patient status, additional functional criteria and insurance authorization.  Follow Up Recommendations  Skilled nursing-short term rehab (<3 hours/day)     Assistance Recommended at Discharge Frequent or constant Supervision/Assistance  Equipment Recommendations  Other (comment) (defer to next level of care)       Precautions / Restrictions Precautions Precautions: Fall;Shoulder Shoulder Interventions: Shoulder sling/immobilizer;Shoulder abduction  pillow Restrictions Weight Bearing Restrictions: Yes RUE Weight Bearing: Non weight bearing Other Position/Activity Restrictions: NWB through R UE or hand     Mobility  Bed Mobility Overal bed mobility: Needs Assistance Bed Mobility: Supine to Sit;Sit to Supine     Supine to sit: Min assist;Mod assist Sit to supine: Min assist   General bed mobility comments: deferred, up to chair pre/post    Transfers Overall transfer level: Needs assistance Equipment used: 1 person hand held assist Transfers: Sit to/from Stand Sit to Stand: Min assist           General transfer comment: deferred    Ambulation/Gait Ambulation/Gait assistance: Min assist Gait Distance (Feet): 400 Feet Assistive device: 1 person hand held assist Gait Pattern/deviations: Step-through pattern Gait velocity: decreased   General Gait Details: pt ambulated 2 laps in hallway wioth +1 HHA. does have some unsteadiness noted. Attempted to wean O2 however pt continues to desaturate. Ambulated on 2 L o2 [place]     Balance Overall balance assessment: Needs assistance Sitting-balance support: Single extremity supported;Feet supported Sitting balance-Leahy Scale: Good Sitting balance - Comments: deferred, up to chair   Standing balance support: Single extremity supported Standing balance-Leahy Scale: Fair Standing balance comment: does continue to have some unsteadiness with +1 UE support and without UE support      Cognition Arousal/Alertness: Awake/alert Behavior During Therapy: WFL for tasks assessed/performed Overall Cognitive Status: Within Functional Limits for tasks assessed          General Comments: Pt is A and O x 4 and cooperative and pleasant        Exercises Other Exercises Other Exercises: OT engages pt in wrist pro/sup x10, flex/ext  x10, digit flex/ext x10 and walks pt through sling application and removal with 2 demosntartions. Pt still requries MOD assist to don sling.     Pertinent  Vitals/Pain Pain Assessment: 0-10 Pain Score: 3  Pain Location: R shoulder Pain Descriptors / Indicators: Burning;Discomfort;Grimacing;Guarding Pain Intervention(s): Limited activity within patient's tolerance;Monitored during session;Premedicated before session;Repositioned;Ice applied     PT Goals (current goals can now be found in the care plan section) Acute Rehab PT Goals Patient Stated Goal: rehab then home Progress towards PT goals: Progressing toward goals    Frequency    BID      PT Plan Current plan remains appropriate       AM-PAC PT "6 Clicks" Mobility   Outcome Measure  Help needed turning from your back to your side while in a flat bed without using bedrails?: A Little Help needed moving from lying on your back to sitting on the side of a flat bed without using bedrails?: A Lot Help needed moving to and from a bed to a chair (including a wheelchair)?: A Little Help needed standing up from a chair using your arms (e.g., wheelchair or bedside chair)?: A Little Help needed to walk in hospital room?: A Little Help needed climbing 3-5 steps with a railing? : A Little 6 Click Score: 17    End of Session Equipment Utilized During Treatment: Gait belt;Oxygen Activity Tolerance: Patient tolerated treatment well Patient left: in bed;with call bell/phone within reach;with bed alarm set;with family/visitor present;with SCD's reapplied Nurse Communication: Mobility status PT Visit Diagnosis: Unsteadiness on feet (R26.81);Other abnormalities of gait and mobility (R26.89);Muscle weakness (generalized) (M62.81);History of falling (Z91.81);Pain Pain - Right/Left: Right Pain - part of body: Shoulder     Time: 1355-1409 PT Time Calculation (min) (ACUTE ONLY): 14 min  Charges:  $Gait Training: 8-22 mins                     Julaine Fusi PTA 06/12/21, 5:27 PM

## 2021-06-12 NOTE — Progress Notes (Signed)
Physical Therapy Treatment Patient Details Name: Cassidy Hernandez MRN: [id number] DOB: [birth year] Today's Date: 06/12/2021   History of Present Illness Pt is an 80 y.o. 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    PT Comments    Pt was long sitting in bed upon arriving." Its starting to hurt today." RN aware and issued pain meds during session. Discussed with pt, DC disposition. She states she does not have anyone who can come stay with her or someone she can stay with. Requesting rehab until she is completely independent. She required min assist to exit bed with HOB elevated. Author adjusted sling to proper position prior to pt standing and ambulating 200 ft with HHA +1. Pt desaturates without O2 [place] in place. Reapplied 2 L o2 and is able to maintain > 90%. Reapplied polar care and repositioned pt in recliner. She will continue to benefit from skilled PT to progress strength, ROM, and safety with all ADLs. Pt is R handed and unable to perform most daily task L handed. Recommend DC to rehab due to lack of support at home.     Recommendations for follow up therapy are one component of a multi-disciplinary discharge planning process, led by the attending physician.  Recommendations may be updated based on patient status, additional functional criteria and insurance authorization.  Follow Up Recommendations  Follow physician's recommendations for discharge plan and follow up therapies     Assistance Recommended at Discharge Frequent or constant Supervision/Assistance  Equipment Recommendations  Other (comment) (defer to next level of care)       Precautions / Restrictions Precautions Precautions: Fall;Shoulder Shoulder  Interventions: Shoulder sling/immobilizer;Shoulder abduction pillow Restrictions Weight Bearing Restrictions: Yes RUE Weight Bearing: Non weight bearing     Mobility  Bed Mobility Overal bed mobility: Needs Assistance Bed Mobility: Supine to Sit;Sit to Supine     Supine to sit: Supervision Sit to supine: Min assist   General bed mobility comments: HOB elevated + min assist to exit bed. vcs throughout for sequencing and technique improvements    Transfers Overall transfer level: Needs assistance Equipment used: 1 person hand held assist Transfers: Sit to/from Stand Sit to Stand: Min assist           General transfer comment: CGA for safety with vcs for fwd wt shift    Ambulation/Gait Ambulation/Gait assistance: Min assist Gait Distance (Feet): 200 Feet Assistive device: 1 person hand held assist Gait Pattern/deviations: Step-through pattern Gait velocity: decreased   General Gait Details: Pt was able to ambulate 200 ft. attempted wean form O2 however pt desaturates to 86%. returned to 2 L O2 [place] andpt able to recover to >90%    Balance Overall balance assessment: Needs assistance Sitting-balance support: Single extremity supported;Feet supported Sitting balance-Leahy Scale: Good     Standing balance support: Single extremity supported Standing balance-Leahy Scale: Fair Standing balance comment: some light sway, no gross LOB       Cognition Arousal/Alertness: Awake/alert Behavior During Therapy: WFL for tasks assessed/performed Overall Cognitive Status: Within Functional Limits for tasks assessed        General Comments: Pt is A and O x 4 and cooperative and pleasant               Pertinent Vitals/Pain Pain  Assessment: 0-10 Pain Score: 6  Pain Location: R shoulder Pain Descriptors / Indicators: Burning;Discomfort;Grimacing;Guarding Pain Intervention(s): Limited activity within patient's tolerance;Monitored during session;Premedicated before session      PT Goals (current goals can now be found in the care plan section) Acute Rehab PT Goals Patient Stated Goal: to improve strength and mobility Progress towards PT goals: Progressing toward goals    Frequency    BID      PT Plan Current plan remains appropriate       AM-PAC PT "6 Clicks" Mobility   Outcome Measure  Help needed turning from your back to your side while in a flat bed without using bedrails?: A Little Help needed moving from lying on your back to sitting on the side of a flat bed without using bedrails?: A Little Help needed moving to and from a bed to a chair (including a wheelchair)?: A Little Help needed standing up from a chair using your arms (e.g., wheelchair or bedside chair)?: A Little Help needed to walk in hospital room?: A Little Help needed climbing 3-5 steps with a railing? : A Lot 6 Click Score: 17    End of Session Equipment Utilized During Treatment: Gait belt;Oxygen Activity Tolerance: Patient tolerated treatment well Patient left: in chair;with call bell/phone within reach;with chair alarm set;with nursing/sitter in room Nurse Communication: Mobility status;Precautions;Other (comment) PT Visit Diagnosis: Unsteadiness on feet (R26.81);Other abnormalities of gait and mobility (R26.89);Muscle weakness (generalized) (M62.81);History of falling (Z91.81);Pain Pain - Right/Left: Right Pain - part of body: Shoulder     Time: 5686-1683 PT Time Calculation (min) (ACUTE ONLY): 17 min  Charges:  $Gait Training: 8-22 mins                    Julaine Fusi PTA 06/12/21, 1:05 PM

## 2021-06-13 LAB — BASIC METABOLIC PANEL
Anion gap: 7 (ref 5–15)
BUN: 33 mg/dL — ABNORMAL HIGH (ref 8–23)
CO2: 29 mmol/L (ref 22–32)
Calcium: 8.4 mg/dL — ABNORMAL LOW (ref 8.9–10.3)
Chloride: 100 mmol/L (ref 98–111)
Creatinine, Ser: 1.17 mg/dL — ABNORMAL HIGH (ref 0.44–1.00)
GFR, Estimated: 47 mL/min — ABNORMAL LOW (ref >60)
Glucose, Bld: 137 mg/dL — ABNORMAL HIGH (ref 70–99)
Potassium: 4.2 mmol/L (ref 3.5–5.1)
Sodium: 136 mmol/L (ref 135–145)

## 2021-06-13 NOTE — Progress Notes (Signed)
Physical Therapy Treatment Patient Details Name: Cassidy Hernandez MRN: [id number] DOB: 11/27/1940 Today's Date: 06/13/2021   History of Present Illness Pt is an 80 y.o. 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    PT Comments    In bathroom with nursing  upon arrival.  Room air sats 88% seated on commode after several minutes.  O2 was reapplied at 2 lpm.  She is able to walk x 2 laps with HHA and slow hesitant gait.  Does not want to try a cane as she feels more confident holding my hand.  She does state a fear of falling.  Remained in recliner after session with needs met.  Sats 94% with gait on 2 lpm.   Recommendations for follow up therapy are one component of a multi-disciplinary discharge planning process, led by the attending physician.  Recommendations may be updated based on patient status, additional functional criteria and insurance authorization.  Follow Up Recommendations  Skilled nursing-short term rehab (<3 hours/day)     Assistance Recommended at Discharge    Equipment Recommendations       Recommendations for Other Services       Precautions / Restrictions Precautions Precautions: Fall;Shoulder Shoulder Interventions: Shoulder sling/immobilizer;Shoulder abduction pillow Restrictions Weight Bearing Restrictions: Yes RUE Weight Bearing: Non weight bearing Other Position/Activity Restrictions: NWB through R UE or hand     Mobility  Bed Mobility               General bed mobility comments: on commode then remained in recliner after session    Transfers Overall transfer level: Needs assistance Equipment used: 1 person hand held assist   Sit to Stand: Min assist           General  transfer comment: deferred    Ambulation/Gait Ambulation/Gait assistance: Min assist Gait Distance (Feet): 400 Feet Assistive device: 1 person hand held assist   Gait velocity: decreased   General Gait Details: pt ambulated 2 laps in hallway wioth +1 HHA. does have some unsteadiness noted.   Stairs             Wheelchair Mobility    Modified Rankin (Stroke Patients Only)       Balance Overall balance assessment: Needs assistance Sitting-balance support: Single extremity supported;Feet supported Sitting balance-Leahy Scale: Good     Standing balance support: Single extremity supported Standing balance-Leahy Scale: Fair Standing balance comment: does continue to have some unsteadiness with +1 UE support and without UE support                            Cognition Arousal/Alertness: Awake/alert Behavior During Therapy: WFL for tasks assessed/performed Overall Cognitive Status: Within Functional Limits for tasks assessed                                          Exercises      General Comments        Pertinent Vitals/Pain Pain Assessment: Faces Faces Pain Scale: Hurts a little bit Pain Location: R shoulder Pain Descriptors / Indicators: Burning;Discomfort;Grimacing;Guarding Pain Intervention(s): Limited activity within patient's tolerance;Monitored during session;Repositioned;Ice applied  Home Living                          Prior Function            PT Goals (current goals can now be found in the care plan section) Progress towards PT goals: Progressing toward goals    Frequency    BID      PT Plan Current plan remains appropriate    Co-evaluation              AM-PAC PT "6 Clicks" Mobility   Outcome Measure  Help needed turning from your back to your side while in a flat bed without using bedrails?: A Little Help needed moving from lying on your back to sitting on the side of a flat bed  without using bedrails?: A Lot Help needed moving to and from a bed to a chair (including a wheelchair)?: A Little Help needed standing up from a chair using your arms (e.g., wheelchair or bedside chair)?: A Little Help needed to walk in hospital room?: A Little Help needed climbing 3-5 steps with a railing? : A Little 6 Click Score: 17    End of Session Equipment Utilized During Treatment: Gait belt;Oxygen Activity Tolerance: Patient tolerated treatment well Patient left: in chair;with chair alarm set;with call bell/phone within reach Nurse Communication: Mobility status PT Visit Diagnosis: Unsteadiness on feet (R26.81);Other abnormalities of gait and mobility (R26.89);Muscle weakness (generalized) (M62.81);History of falling (Z91.81);Pain Pain - Right/Left: Right Pain - part of body: Shoulder     Time: 7142-3200 PT Time Calculation (min) (ACUTE ONLY): 24 min  Charges:  $Gait Training: 8-22 mins $Therapeutic Activity: 8-22 mins                    Chesley Noon, PTA 06/13/21, 9:37 AM

## 2021-06-13 NOTE — Progress Notes (Signed)
Subjective: 3 Days Post-Op Procedure(s) (LRB): REVERSE SHOULDER ARTHROPLASTY (Right) BICEPS TENODESIS Patient reports pain as mild.   Patient is well, and has had no acute complaints or problems PT and care management to assist with discharge planning. Patient lives at home alone, hopeful to discharge to [facility] Monday Negative for chest pain and shortness of breath Fever: no Gastrointestinal:Negative for nausea and vomiting Slow progress with physical therapy.  Patient still very unsteady with poor balance and requiring 2 L of O2  Objective: Vital signs in last 24 hours: Temp:  [97.9 F (36.6 C)-98.8 F (37.1 C)] 98.8 F (37.1 C) (11/05 2118) Pulse Rate:  [82-89] 82 (11/05 2118) Resp:  [16-18] 18 (11/05 2118) BP: (103-122)/(47-60) 103/55 (11/05 2118) SpO2:  [90 %-99 %] 90 % (11/05 2118)  Intake/Output from previous day:  Intake/Output Summary (Last 24 hours) at 06/13/2021 0734 Last data filed at 06/12/2021 1416 Gross per 24 hour  Intake 120 ml  Output --  Net 120 ml    Intake/Output this shift: No intake/output data recorded.  Labs: Recent Labs    06/11/21 0652 06/12/21 0352  HGB 13.6 11.4*   Recent Labs    06/11/21 0652 06/12/21 0352  WBC 18.0* 15.9*  RBC 4.39 3.67*  HCT 41.3 34.1*  PLT 251 227   Recent Labs    06/12/21 0352 06/13/21 0343  NA 137 136  K 4.3 4.2  CL 101 100  CO2 28 29  BUN 25* 33*  CREATININE 0.99 1.17*  GLUCOSE 128* 137*  CALCIUM 8.9 8.4*   No results for input(s): LABPT, INR in the last 72 hours.   EXAM General - Patient is Alert, Appropriate, and Oriented Extremity - ABD soft Incision: dressing C/D/I No cellulitis present Patient is flexing and extending all fingers to the right hand without pain, intact wrist flexion and extension as well. Patient reports she is intact to light touch over the lateral aspect of the deltoid. Honeycomb dressing intact without any drainage noted.   Past Medical History:   Diagnosis Date   Anginal pain ([place])    Aortic atherosclerosis    Cancer ([place])    Carotid artery stenosis 02/01/2010   a.) Doppler 50/04/3817: 29% LICA and 93% RICA. b.) Doppler 71/69/6789: >38% LICA and 10% RICA. c.) Doppler 10/25/16: >70% Bilater ICAs   Chronic bilateral low back pain with left-sided sciatica    CKD (chronic kidney disease), stage III (HCC)    Coronary artery disease    a.) LHC 12/24/2014 -> small LAD system; diffuse CTO of LAD. 60% RCA. LM and LCx with no sig disease. no intervention; med mgmt.   Degenerative arthritis    Diastolic dysfunction    a.) TTE 09/20/2018: EF 55%, G1DD, mild LAE and RVE, triv-mild panvalvular regurgitation.   Dysarthria    GERD (gastroesophageal reflux disease)    Hepatic steatosis    History of kidney stones    HLD (hyperlipidemia)    Hypertension    Kidney stone    Lumbar radiculopathy    Major depression in remission ([place])    PVD (peripheral vascular disease) ([place])    Renal cyst, right 06/02/2015   a.) CT 06/02/2015 --> 4.4 cm RIGHT renal mass; MRI recommended. b.) MRI 06/16/2015 --> 3.8 x 4.2 x 4.0 cm proteinaceous/hemmorhagic cyst.   Subclavian steal syndrome    a.) LHC 12/24/2014 --> tight eccentric 90% ostial stenosis with damping.   T2DM (type 2 diabetes mellitus) (HCC)     Assessment/Plan: 3 Days Post-Op Procedure(s) (LRB):  REVERSE SHOULDER ARTHROPLASTY (Right) BICEPS TENODESIS Active Problems:   Status post reverse arthroplasty of shoulder, right  Estimated body mass index is 31.55 kg/m as calculated from the following:   Height as of this encounter: 5\' 3"  (1.6 m).   Weight as of this encounter: 80.8 kg. Advance diet Up with therapy D/C IV fluids when tolerating po intake.  Vital signs are stable. Patient still requiring 2 L of O2 via nasal cannula.  Pain well controlled  Up with therapy today.  Patient is hopeful for discharge to [facility]. Per care management, bed not available until Monday.  Will see  how she performs with therapy, if she performs well enough she may be able to discharge home however she does live on her own and we anticipate patient will need to go to rehab.  DVT Prophylaxis - Lovenox, Foot Pumps, and TED hose Non-weightbearing to the right arm.  Ronney Asters, PA-C [facility]opa[facility] 06/13/2021, 7:34 AM

## 2021-06-13 NOTE — TOC Progression Note (Signed)
Transition of Care [facility]) - Progression Note    Patient Details  Name: Cassidy Hernandez MRN: [id number] Date of Birth: June 29, 1941  Transition of Care [facility]) CM/SW [place], LCSW Phone Number: 06/13/2021, 3:48 PM  Clinical Narrative:    Patient approved for SNF. Candace Cruise ID [id number]. Call from [place] with [facility] who confirmed they have a bed for patient tomorrow 06/14/21.        Expected Discharge Plan and Services                                                 Social Determinants of Health (SDOH) Interventions    Readmission Risk Interventions No flowsheet data found.

## 2021-06-14 DIAGNOSIS — G4700 Insomnia, unspecified: Secondary | ICD-10-CM | POA: Insufficient documentation

## 2021-06-14 DIAGNOSIS — K5900 Constipation, unspecified: Secondary | ICD-10-CM | POA: Insufficient documentation

## 2021-06-14 DIAGNOSIS — F419 Anxiety disorder, unspecified: Secondary | ICD-10-CM | POA: Insufficient documentation

## 2021-06-14 DIAGNOSIS — G5603 Carpal tunnel syndrome, bilateral upper limbs: Secondary | ICD-10-CM | POA: Insufficient documentation

## 2021-06-14 DIAGNOSIS — I739 Peripheral vascular disease, unspecified: Secondary | ICD-10-CM | POA: Insufficient documentation

## 2021-06-14 DIAGNOSIS — Z7982 Long term (current) use of aspirin: Secondary | ICD-10-CM | POA: Insufficient documentation

## 2021-06-14 DIAGNOSIS — F339 Major depressive disorder, recurrent, unspecified: Secondary | ICD-10-CM | POA: Insufficient documentation

## 2021-06-14 DIAGNOSIS — F321 Major depressive disorder, single episode, moderate: Secondary | ICD-10-CM | POA: Insufficient documentation

## 2021-06-14 LAB — RESP PANEL BY RT-PCR (FLU A&B, COVID) ARPGX2
Influenza A by PCR: NEGATIVE
Influenza B by PCR: NEGATIVE
SARS Coronavirus 2 by RT PCR: NEGATIVE

## 2021-06-14 LAB — SURGICAL PATHOLOGY

## 2021-06-14 NOTE — Progress Notes (Signed)
Blood pressure 103/65, pulse 92, temperature 98.1 F (36.7 C), resp. rate 15, height 5\' 3"  (1.6 m), weight 80.8 kg, SpO2 92 %. IV removed site c/d/I. Discharge packet discussed pt verbalizes understanding. Pt sent with polar car and scripts placed in packet to [facility] daughter is driving pt there. Pt transported with all belongings via w/c down to private car.

## 2021-06-14 NOTE — Progress Notes (Signed)
Subjective: 4 Days Post-Op Procedure(s) (LRB): REVERSE SHOULDER ARTHROPLASTY (Right) BICEPS TENODESIS Patient reports pain as mild.   Patient is well, and has had no acute complaints or problems Current plan is for discharge to SNF, plan for d/c to [facility] today. Negative for chest pain and shortness of breath, patient is still requiring 2L of O2 with ambulation, will hopefully wean off today. Fever: no Gastrointestinal:Negative for nausea and vomiting, she has had a BM.  Objective: Vital signs in last 24 hours: Temp:  [98 F (36.7 C)-98.7 F (37.1 C)] 98.1 F (36.7 C) (11/07 0425) Pulse Rate:  [84-98] 87 (11/07 0425) Resp:  [19-20] 20 (11/07 0425) BP: (100-122)/(52-70) 106/59 (11/07 0425) SpO2:  [94 %-97 %] 96 % (11/07 0425)  Intake/Output from previous day:  Intake/Output Summary (Last 24 hours) at 06/14/2021 0758 Last data filed at 06/13/2021 2112 Gross per 24 hour  Intake 360 ml  Output --  Net 360 ml    Intake/Output this shift: No intake/output data recorded.  Labs: Recent Labs    06/12/21 0352  HGB 11.4*   Recent Labs    06/12/21 0352  WBC 15.9*  RBC 3.67*  HCT 34.1*  PLT 227   Recent Labs    06/12/21 0352 06/13/21 0343  NA 137 136  K 4.3 4.2  CL 101 100  CO2 28 29  BUN 25* 33*  CREATININE 0.99 1.17*  GLUCOSE 128* 137*  CALCIUM 8.9 8.4*   No results for input(s): LABPT, INR in the last 72 hours.   EXAM General - Patient is Alert, Appropriate, and Oriented Extremity - ABD soft Incision: dressing C/D/I No cellulitis present Patient is flexing and extending all fingers to the right hand without pain, intact wrist flexion and extension as well. Patient reports she is intact to light touch over the lateral aspect of the deltoid. Honeycomb dressing intact without any drainage noted. Abdomen soft with normal bowel sounds this AM.   Past Medical History:  Diagnosis Date   Anginal pain ([place])    Aortic atherosclerosis    Cancer ([place])     Carotid artery stenosis 02/01/2010   a.) Doppler 27/01/2375: 28% LICA and 31% RICA. b.) Doppler 51/76/1607: >37% LICA and 10% RICA. c.) Doppler 10/25/16: >70% Bilater ICAs   Chronic bilateral low back pain with left-sided sciatica    CKD (chronic kidney disease), stage III (HCC)    Coronary artery disease    a.) LHC 12/24/2014 -> small LAD system; diffuse CTO of LAD. 60% RCA. LM and LCx with no sig disease. no intervention; med mgmt.   Degenerative arthritis    Diastolic dysfunction    a.) TTE 09/20/2018: EF 55%, G1DD, mild LAE and RVE, triv-mild panvalvular regurgitation.   Dysarthria    GERD (gastroesophageal reflux disease)    Hepatic steatosis    History of kidney stones    HLD (hyperlipidemia)    Hypertension    Kidney stone    Lumbar radiculopathy    Major depression in remission ([place])    PVD (peripheral vascular disease) ([place])    Renal cyst, right 06/02/2015   a.) CT 06/02/2015 --> 4.4 cm RIGHT renal mass; MRI recommended. b.) MRI 06/16/2015 --> 3.8 x 4.2 x 4.0 cm proteinaceous/hemmorhagic cyst.   Subclavian steal syndrome    a.) LHC 12/24/2014 --> tight eccentric 90% ostial stenosis with damping.   T2DM (type 2 diabetes mellitus) (HCC)     Assessment/Plan: 4 Days Post-Op Procedure(s) (LRB): REVERSE SHOULDER ARTHROPLASTY (Right) BICEPS TENODESIS Active Problems:  Status post reverse arthroplasty of shoulder, right  Estimated body mass index is 31.55 kg/m as calculated from the following:   Height as of this encounter: 5\' 3"  (1.6 m).   Weight as of this encounter: 80.8 kg. Advance diet Up with therapy D/C IV fluids when tolerating po intake.  Vital signs are stable. Patient still requiring 2 L of O2 via nasal cannula.  Will attempt to wean off prior to discharge.  Encouraged incentive spirometer. Pain is well controlled. Paitent has had a BM. Bed at [facility] is available.  COVID test ordered. Plan for discharge to SNF this afternoon, follow-up with [facility] in 10-14 days for staple removal.  DVT Prophylaxis - Lovenox, Foot Pumps, and TED hose Non-weightbearing to the right arm.  Raquel Henreitta Spittler, PA-C [facility] Orthopaedic Surgery 06/14/2021, 7:58 AM

## 2021-06-14 NOTE — TOC Progression Note (Signed)
Transition of Care [facility]) - Progression Note    Patient Details  Name: Cassidy Hernandez MRN: [id number] Date of Birth: 23-Feb-1941  [facility]) CM/SW [place], RN Phone Number: 06/14/2021, 9:09 AM  Clinical Narrative:    Received Insurance approval to go to [facility] today, Auth number [id number], Notified Magda Paganini at [facility], [place] orders thru [employer], awaiting a room number        Expected Discharge Plan and Services           Expected Discharge Date: 06/14/21                                     Social Determinants of Health (SDOH) Interventions    Readmission Risk Interventions No flowsheet data found.

## 2021-06-14 NOTE — Care Management Important Message (Signed)
Important Message  Patient Details  Name: Cassidy Hernandez MRN: [id number] Date of Birth: 1941-06-10   Medicare Important Message Given:  Yes     Juliann Pulse A Kenith Trickel 06/14/2021, 11:02 AM

## 2021-06-14 NOTE — TOC Progression Note (Signed)
Transition of Care [facility]) - Progression Note    Patient Details  Name: Cassidy Hernandez MRN: [id number] Date of Birth: [birth year]  Transition of Care [facility]) CM/SW [place], RN Phone Number: 06/14/2021, 1:11 PM  Clinical Narrative:    Notified the patient that she is going to go to room 503 at [facility], Her daughter will transport, once the Covid test comes back and is negative        Expected Discharge Plan and Services           Expected Discharge Date: 06/14/21                                     Social Determinants of Health (SDOH) Interventions    Readmission Risk Interventions No flowsheet data found.

## 2021-06-14 NOTE — Progress Notes (Signed)
Physical Therapy Treatment Patient Details Name: Cassidy Hernandez MRN: [id number] DOB: 12/02/1940 Today's Date: 06/14/2021   History of Present Illness Pt is an 80 y.o. 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    PT Comments    Patient tolerated session well and was agreeable to treatment. Patient continues to require Min A with bed mobility transfers and sit to stand transfers due to NWB precautions and global weakness. Patient required education and verbal cueing on weight distribution placement due to patient feeling like she "was falling backwards". Patient continues to require continuous verbal cueing for spatial awareness of RUE to avoid hitting it on external objects. She was able to ambulate 1 lap around the hallway CGA with no hand held assist. Unsteadiness is present, however no LOB noted. Session started with patient no room air, however after EOB activities spO2 dropped to 89%. Patient was then placed on 2L O2 throughout the rest of the session. Patient would continue to benefit from skilled physical therapy to improve balance, gait deficits, and overall strength. Patient is progressing nicely towards her goals.   Recommendations for follow up therapy are one component of a multi-disciplinary discharge planning process, led by the attending physician.  Recommendations may be updated based on patient status, additional functional criteria and insurance authorization.  Follow Up Recommendations  Skilled nursing-short term rehab (<3 hours/day)     Assistance Recommended at Discharge Frequent or constant Supervision/Assistance  Equipment Recommendations  Other (comment) (defer to next level of care)    Recommendations for  Other Services       Precautions / Restrictions Precautions Precautions: Fall;Shoulder Shoulder Interventions: Shoulder sling/immobilizer;Shoulder abduction pillow Restrictions Weight Bearing Restrictions: Yes RUE Weight Bearing: Non weight bearing Other Position/Activity Restrictions: NWB through R UE or hand     Mobility  Bed Mobility Overal bed mobility: Needs Assistance Bed Mobility: Supine to Sit;Sit to Supine     Supine to sit: Min assist Sit to supine: Min assist        Transfers Overall transfer level: Needs assistance Equipment used: 1 person hand held assist Transfers: Sit to/from Stand Sit to Stand: Min assist Stand pivot transfers: Min assist              Ambulation/Gait Ambulation/Gait assistance: Min assist (L HHA) Gait Distance (Feet): 360 Feet Assistive device: 1 person hand held assist Gait Pattern/deviations: Step-through pattern;Decreased step length - right;Decreased step length - left;Decreased stride length Gait velocity: decreased     General Gait Details: patient ambulated 2 laps in hallways with 1+ HHA; was able to ambulate x160 feet with CGA only (no HHA) required HHA after utilizing bathroom due to fatigue; unsteadiness noted; verbal cueing to increase weight through forefoot vs heels to decreased backwards lean (and "feeling of falling backwards")   Stairs             Wheelchair Mobility    Modified Rankin (Stroke Patients Only)       Balance Overall balance assessment: Needs assistance Sitting-balance support: Single extremity supported;Feet supported Sitting balance-Leahy Scale: Good     Standing balance support: Single extremity supported Standing balance-Leahy Scale: Fair Standing balance comment: does continue to have some unsteadiness with +1 UE support and without UE support  Cognition Arousal/Alertness: Awake/alert Behavior During Therapy: WFL for tasks  assessed/performed Overall Cognitive Status: Within Functional Limits for tasks assessed                                          Exercises General Exercises - Lower Extremity Short Arc Quad: Seated;AROM;Both;10 reps Hip Flexion/Marching: AROM;Strengthening;Both;Seated;20 reps    General Comments        Pertinent Vitals/Pain Pain Assessment: No/denies pain Pain Intervention(s): Monitored during session;Premedicated before session;Repositioned    Home Living                          Prior Function            PT Goals (current goals can now be found in the care plan section) Acute Rehab PT Goals Patient Stated Goal: rehab then home PT Goal Formulation: With patient Time For Goal Achievement: 06/24/21 Potential to Achieve Goals: Good Progress towards PT goals: Progressing toward goals    Frequency    BID      PT Plan Current plan remains appropriate    Co-evaluation              AM-PAC PT "6 Clicks" Mobility   Outcome Measure  Help needed turning from your back to your side while in a flat bed without using bedrails?: A Little Help needed moving from lying on your back to sitting on the side of a flat bed without using bedrails?: A Lot Help needed moving to and from a bed to a chair (including a wheelchair)?: A Little Help needed standing up from a chair using your arms (e.g., wheelchair or bedside chair)?: A Little Help needed to walk in hospital room?: A Little Help needed climbing 3-5 steps with a railing? : A Little 6 Click Score: 17    End of Session Equipment Utilized During Treatment: Gait belt;Oxygen Activity Tolerance: Patient tolerated treatment well Patient left: in bed;with SCD's reapplied;with call bell/phone within reach;with bed alarm set   PT Visit Diagnosis: Unsteadiness on feet (R26.81);Other abnormalities of gait and mobility (R26.89);Muscle weakness (generalized) (M62.81);History of falling (Z91.81);Pain      Time: 1010-1057 PT Time Calculation (min) (ACUTE ONLY): 47 min  Charges:  $Gait Training: 8-22 mins $Therapeutic Exercise: 8-22 mins $Therapeutic Activity: 8-22 mins                     Iva Boop, PT  06/14/21. 11:16 AM

## 2021-06-15 NOTE — Anesthesia Postprocedure Evaluation (Signed)
Anesthesia Post Note  Patient: Cassidy Hernandez  Procedure(s) Performed: REVERSE SHOULDER ARTHROPLASTY (Right: Shoulder) BICEPS TENODESIS  Patient location during evaluation: PACU Anesthesia Type: General Level of consciousness: awake and alert Pain management: pain level controlled Vital Signs Assessment: post-procedure vital signs reviewed and stable Respiratory status: spontaneous breathing, nonlabored ventilation, respiratory function stable and patient connected to nasal cannula oxygen Cardiovascular status: blood pressure returned to baseline and stable Postop Assessment: no apparent nausea or vomiting Anesthetic complications: no   No notable events documented.   Last Vitals:  Vitals:   06/14/21 0823 06/14/21 1527  BP: (!) 121/51 103/65  Pulse: 85 92  Resp: 15 15  Temp: 36.9 C 36.7 C  SpO2: 97% 92%    Last Pain:  Vitals:   06/13/21 2159  TempSrc:   PainSc: 2                  Molli Barrows

## 2021-11-16 ENCOUNTER — Inpatient Hospital Stay (HOSPITAL_COMMUNITY): Payer: Medicare Other

## 2021-11-16 ENCOUNTER — Emergency Department
Admission: EM | Admit: 2021-11-16 | Discharge: 2021-11-16 | Disposition: A | Discharge status: Other Acute Inpatient Hospital | Payer: Medicare Other | Attending: Emergency Medicine | Admitting: Emergency Medicine

## 2021-11-16 ENCOUNTER — Emergency Department (HOSPITAL_COMMUNITY): Payer: Medicare Other

## 2021-11-16 ENCOUNTER — Emergency Department (HOSPITAL_COMMUNITY): Payer: Medicare Other | Admitting: Anesthesiology

## 2021-11-16 ENCOUNTER — Other Ambulatory Visit: Payer: Self-pay

## 2021-11-16 ENCOUNTER — Emergency Department: Payer: Medicare Other

## 2021-11-16 ENCOUNTER — Inpatient Hospital Stay: Admit: 2021-11-16 | Payer: Medicare Other

## 2021-11-16 ENCOUNTER — Encounter (HOSPITAL_COMMUNITY)
Admission: EM | Disposition: A | Discharge status: Rehabilitation Facility | Payer: Self-pay | Source: Home / Self Care | Attending: Neurology

## 2021-11-16 ENCOUNTER — Inpatient Hospital Stay (HOSPITAL_COMMUNITY)
Admission: EM | Admit: 2021-11-16 | Discharge: 2021-11-25 | DRG: 023 | Disposition: A | Discharge status: Rehabilitation Facility | Payer: Medicare Other | Attending: Neurology | Admitting: Neurology

## 2021-11-16 DIAGNOSIS — R130 Aphagia: Secondary | ICD-10-CM

## 2021-11-16 DIAGNOSIS — I130 Hypertensive heart and chronic kidney disease with heart failure and stage 1 through stage 4 chronic kidney disease, or unspecified chronic kidney disease: Secondary | ICD-10-CM | POA: Diagnosis present

## 2021-11-16 DIAGNOSIS — I63032 Cerebral infarction due to thrombosis of left carotid artery: Secondary | ICD-10-CM | POA: Diagnosis present

## 2021-11-16 DIAGNOSIS — E7800 Pure hypercholesterolemia, unspecified: Secondary | ICD-10-CM | POA: Diagnosis not present

## 2021-11-16 DIAGNOSIS — I2510 Atherosclerotic heart disease of native coronary artery without angina pectoris: Secondary | ICD-10-CM | POA: Diagnosis present

## 2021-11-16 DIAGNOSIS — E1151 Type 2 diabetes mellitus with diabetic peripheral angiopathy without gangrene: Secondary | ICD-10-CM | POA: Diagnosis present

## 2021-11-16 DIAGNOSIS — E785 Hyperlipidemia, unspecified: Secondary | ICD-10-CM | POA: Diagnosis present

## 2021-11-16 DIAGNOSIS — R29712 NIHSS score 12: Secondary | ICD-10-CM | POA: Diagnosis present

## 2021-11-16 DIAGNOSIS — M1990 Unspecified osteoarthritis, unspecified site: Secondary | ICD-10-CM | POA: Diagnosis present

## 2021-11-16 DIAGNOSIS — I639 Cerebral infarction, unspecified: Secondary | ICD-10-CM

## 2021-11-16 DIAGNOSIS — E669 Obesity, unspecified: Secondary | ICD-10-CM | POA: Diagnosis present

## 2021-11-16 DIAGNOSIS — Z20822 Contact with and (suspected) exposure to covid-19: Secondary | ICD-10-CM | POA: Insufficient documentation

## 2021-11-16 DIAGNOSIS — I63412 Cerebral infarction due to embolism of left middle cerebral artery: Secondary | ICD-10-CM | POA: Diagnosis not present

## 2021-11-16 DIAGNOSIS — N1831 Chronic kidney disease, stage 3a: Secondary | ICD-10-CM | POA: Diagnosis present

## 2021-11-16 DIAGNOSIS — Z6830 Body mass index (BMI) 30.0-30.9, adult: Secondary | ICD-10-CM

## 2021-11-16 DIAGNOSIS — I63512 Cerebral infarction due to unspecified occlusion or stenosis of left middle cerebral artery: Secondary | ICD-10-CM | POA: Diagnosis not present

## 2021-11-16 DIAGNOSIS — Z882 Allergy status to sulfonamides status: Secondary | ICD-10-CM | POA: Diagnosis not present

## 2021-11-16 DIAGNOSIS — I129 Hypertensive chronic kidney disease with stage 1 through stage 4 chronic kidney disease, or unspecified chronic kidney disease: Secondary | ICD-10-CM

## 2021-11-16 DIAGNOSIS — I509 Heart failure, unspecified: Secondary | ICD-10-CM | POA: Diagnosis not present

## 2021-11-16 DIAGNOSIS — Z8673 Personal history of transient ischemic attack (TIA), and cerebral infarction without residual deficits: Secondary | ICD-10-CM

## 2021-11-16 DIAGNOSIS — I6523 Occlusion and stenosis of bilateral carotid arteries: Secondary | ICD-10-CM | POA: Diagnosis not present

## 2021-11-16 DIAGNOSIS — R4701 Aphasia: Secondary | ICD-10-CM | POA: Diagnosis present

## 2021-11-16 DIAGNOSIS — I6782 Cerebral ischemia: Secondary | ICD-10-CM | POA: Diagnosis not present

## 2021-11-16 DIAGNOSIS — N183 Chronic kidney disease, stage 3 unspecified: Secondary | ICD-10-CM

## 2021-11-16 DIAGNOSIS — K760 Fatty (change of) liver, not elsewhere classified: Secondary | ICD-10-CM | POA: Diagnosis present

## 2021-11-16 DIAGNOSIS — I6522 Occlusion and stenosis of left carotid artery: Secondary | ICD-10-CM | POA: Diagnosis not present

## 2021-11-16 DIAGNOSIS — E119 Type 2 diabetes mellitus without complications: Secondary | ICD-10-CM | POA: Diagnosis not present

## 2021-11-16 DIAGNOSIS — I63132 Cerebral infarction due to embolism of left carotid artery: Secondary | ICD-10-CM

## 2021-11-16 DIAGNOSIS — R4781 Slurred speech: Secondary | ICD-10-CM | POA: Diagnosis not present

## 2021-11-16 DIAGNOSIS — I10 Essential (primary) hypertension: Secondary | ICD-10-CM | POA: Diagnosis not present

## 2021-11-16 DIAGNOSIS — I6602 Occlusion and stenosis of left middle cerebral artery: Secondary | ICD-10-CM | POA: Diagnosis present

## 2021-11-16 DIAGNOSIS — D72829 Elevated white blood cell count, unspecified: Secondary | ICD-10-CM | POA: Diagnosis not present

## 2021-11-16 DIAGNOSIS — R471 Dysarthria and anarthria: Secondary | ICD-10-CM | POA: Diagnosis present

## 2021-11-16 DIAGNOSIS — K219 Gastro-esophageal reflux disease without esophagitis: Secondary | ICD-10-CM | POA: Diagnosis present

## 2021-11-16 DIAGNOSIS — Z8249 Family history of ischemic heart disease and other diseases of the circulatory system: Secondary | ICD-10-CM

## 2021-11-16 DIAGNOSIS — I63312 Cerebral infarction due to thrombosis of left middle cerebral artery: Secondary | ICD-10-CM | POA: Diagnosis present

## 2021-11-16 DIAGNOSIS — Z95828 Presence of other vascular implants and grafts: Secondary | ICD-10-CM | POA: Diagnosis not present

## 2021-11-16 DIAGNOSIS — I700 Atherosclerosis of aorta: Secondary | ICD-10-CM | POA: Diagnosis present

## 2021-11-16 DIAGNOSIS — I5032 Chronic diastolic (congestive) heart failure: Secondary | ICD-10-CM | POA: Diagnosis present

## 2021-11-16 DIAGNOSIS — E1122 Type 2 diabetes mellitus with diabetic chronic kidney disease: Secondary | ICD-10-CM | POA: Diagnosis not present

## 2021-11-16 HISTORY — DX: Personal history of transient ischemic attack (TIA), and cerebral infarction without residual deficits: Z86.73

## 2021-11-16 HISTORY — PX: RADIOLOGY WITH ANESTHESIA: SHX6223

## 2021-11-16 HISTORY — PX: IR CT HEAD LTD: IMG2386

## 2021-11-16 HISTORY — PX: IR PERCUTANEOUS ART THROMBECTOMY/INFUSION INTRACRANIAL INC DIAG ANGIO: IMG6087

## 2021-11-16 LAB — URINALYSIS, ROUTINE W REFLEX MICROSCOPIC
Bilirubin Urine: NEGATIVE
Glucose, UA: NEGATIVE mg/dL
Ketones, ur: NEGATIVE mg/dL
Leukocytes,Ua: NEGATIVE
Nitrite: NEGATIVE
Protein, ur: >=300 mg/dL — AB
Specific Gravity, Urine: 1.014 (ref 1.005–1.030)
Squamous Epithelial / HPF: NONE SEEN (ref 0–5)
pH: 7 (ref 5.0–8.0)

## 2021-11-16 LAB — DIFFERENTIAL
Abs Immature Granulocytes: 0.09 10*3/uL — ABNORMAL HIGH (ref 0.00–0.07)
Basophils Absolute: 0.1 10*3/uL (ref 0.0–0.1)
Basophils Relative: 0 %
Eosinophils Absolute: 0 10*3/uL (ref 0.0–0.5)
Eosinophils Relative: 0 %
Immature Granulocytes: 1 %
Lymphocytes Relative: 15 %
Lymphs Abs: 2.2 10*3/uL (ref 0.7–4.0)
Monocytes Absolute: 1.2 10*3/uL — ABNORMAL HIGH (ref 0.1–1.0)
Monocytes Relative: 8 %
Neutro Abs: 11.2 10*3/uL — ABNORMAL HIGH (ref 1.7–7.7)
Neutrophils Relative %: 76 %

## 2021-11-16 LAB — CBC
HCT: 40.5 % (ref 36.0–46.0)
Hemoglobin: 13.3 g/dL (ref 12.0–15.0)
MCH: 28.7 pg (ref 26.0–34.0)
MCHC: 32.8 g/dL (ref 30.0–36.0)
MCV: 87.5 fL (ref 80.0–100.0)
Platelets: 267 10*3/uL (ref 150–400)
RBC: 4.63 MIL/uL (ref 3.87–5.11)
RDW: 13.9 % (ref 11.5–15.5)
WBC: 14.6 10*3/uL — ABNORMAL HIGH (ref 4.0–10.5)
nRBC: 0 % (ref 0.0–0.2)

## 2021-11-16 LAB — PROTIME-INR
INR: 1.1 (ref 0.8–1.2)
Prothrombin Time: 13.7 seconds (ref 11.4–15.2)

## 2021-11-16 LAB — POCT I-STAT 7, (LYTES, BLD GAS, ICA,H+H)
Acid-base deficit: 3 mmol/L — ABNORMAL HIGH (ref 0.0–2.0)
Bicarbonate: 21.7 mmol/L (ref 20.0–28.0)
Calcium, Ion: 1.2 mmol/L (ref 1.15–1.40)
HCT: 34 % — ABNORMAL LOW (ref 36.0–46.0)
Hemoglobin: 11.6 g/dL — ABNORMAL LOW (ref 12.0–15.0)
O2 Saturation: 93 %
Patient temperature: 97
Potassium: 3.8 mmol/L (ref 3.5–5.1)
Sodium: 136 mmol/L (ref 135–145)
TCO2: 23 mmol/L (ref 22–32)
pCO2 arterial: 36.3 mmHg (ref 32–48)
pH, Arterial: 7.38 (ref 7.35–7.45)
pO2, Arterial: 64 mmHg — ABNORMAL LOW (ref 83–108)

## 2021-11-16 LAB — COMPREHENSIVE METABOLIC PANEL
ALT: 20 U/L (ref 0–44)
AST: 30 U/L (ref 15–41)
Albumin: 3.9 g/dL (ref 3.5–5.0)
Alkaline Phosphatase: 56 U/L (ref 38–126)
Anion gap: 11 (ref 5–15)
BUN: 26 mg/dL — ABNORMAL HIGH (ref 8–23)
CO2: 26 mmol/L (ref 22–32)
Calcium: 9.2 mg/dL (ref 8.9–10.3)
Chloride: 99 mmol/L (ref 98–111)
Creatinine, Ser: 0.99 mg/dL (ref 0.44–1.00)
GFR, Estimated: 57 mL/min — ABNORMAL LOW (ref >60)
Glucose, Bld: 94 mg/dL (ref 70–99)
Potassium: 4 mmol/L (ref 3.5–5.1)
Sodium: 136 mmol/L (ref 135–145)
Total Bilirubin: 1 mg/dL (ref 0.3–1.2)
Total Protein: 7.5 g/dL (ref 6.5–8.1)

## 2021-11-16 LAB — APTT: aPTT: 21 seconds — ABNORMAL LOW (ref 24–36)

## 2021-11-16 LAB — TROPONIN I (HIGH SENSITIVITY)
Troponin I (High Sensitivity): 10 ng/L (ref <18)
Troponin I (High Sensitivity): 14 ng/L (ref <18)

## 2021-11-16 LAB — RESP PANEL BY RT-PCR (FLU A&B, COVID) ARPGX2
Influenza A by PCR: NEGATIVE
Influenza B by PCR: NEGATIVE
SARS Coronavirus 2 by RT PCR: NEGATIVE

## 2021-11-16 LAB — CK: Total CK: 533 U/L — ABNORMAL HIGH (ref 38–234)

## 2021-11-16 LAB — GLUCOSE, CAPILLARY: Glucose-Capillary: 155 mg/dL — ABNORMAL HIGH (ref 70–99)

## 2021-11-16 SURGERY — IR WITH ANESTHESIA
Anesthesia: General

## 2021-11-16 MED ORDER — CLEVIDIPINE BUTYRATE 0.5 MG/ML IV EMUL
0.0000 mg/h | INTRAVENOUS | Status: AC
  Administered 2021-11-16: 4 mg/h via INTRAVENOUS
  Filled 2021-11-16: qty 50

## 2021-11-16 MED ORDER — ACETAMINOPHEN 650 MG RE SUPP: 650.0000 mg | RECTAL | Status: DC | PRN

## 2021-11-16 MED ORDER — LACTATED RINGERS IV SOLN: INTRAVENOUS | Status: DC | PRN

## 2021-11-16 MED ORDER — FENTANYL CITRATE (PF) 100 MCG/2ML IJ SOLN: 25.0000 ug | Freq: Once | INTRAMUSCULAR | Status: DC

## 2021-11-16 MED ORDER — SENNOSIDES-DOCUSATE SODIUM 8.6-50 MG PO TABS: 1.0000 | ORAL_TABLET | Freq: Every evening | ORAL | Status: DC | PRN

## 2021-11-16 MED ORDER — IOHEXOL 300 MG/ML  SOLN
100.0000 mL | Freq: Once | INTRAMUSCULAR | Status: AC | PRN
  Administered 2021-11-16: 82 mL via INTRA_ARTERIAL

## 2021-11-16 MED ORDER — CEFAZOLIN SODIUM-DEXTROSE 2-4 GM/100ML-% IV SOLN
INTRAVENOUS | Status: AC
  Filled 2021-11-16: qty 100

## 2021-11-16 MED ORDER — CHLORHEXIDINE GLUCONATE CLOTH 2 % EX PADS
6.0000 | MEDICATED_PAD | Freq: Every day | CUTANEOUS | Status: DC
  Administered 2021-11-17 – 2021-11-19 (×3): 6 via TOPICAL

## 2021-11-16 MED ORDER — PHENYLEPHRINE HCL-NACL 20-0.9 MG/250ML-% IV SOLN
INTRAVENOUS | Status: DC | PRN
  Administered 2021-11-16: 10 ug/min via INTRAVENOUS

## 2021-11-16 MED ORDER — DOCUSATE SODIUM 50 MG/5ML PO LIQD
100.0000 mg | Freq: Two times a day (BID) | ORAL | Status: DC
  Administered 2021-11-16 – 2021-11-17 (×2): 100 mg
  Filled 2021-11-16 (×2): qty 10

## 2021-11-16 MED ORDER — TICAGRELOR 90 MG PO TABS
90.0000 mg | ORAL_TABLET | Freq: Two times a day (BID) | ORAL | Status: DC
  Administered 2021-11-17 – 2021-11-25 (×16): 90 mg via ORAL
  Filled 2021-11-16 (×16): qty 1

## 2021-11-16 MED ORDER — IOHEXOL 300 MG/ML  SOLN
100.0000 mL | Freq: Once | INTRAMUSCULAR | Status: AC | PRN
  Administered 2021-11-16: 80 mL via INTRA_ARTERIAL

## 2021-11-16 MED ORDER — CLOPIDOGREL BISULFATE 300 MG PO TABS
ORAL_TABLET | ORAL | Status: AC
  Filled 2021-11-16: qty 1

## 2021-11-16 MED ORDER — ACETAMINOPHEN 325 MG PO TABS: 650.0000 mg | ORAL_TABLET | ORAL | Status: DC | PRN

## 2021-11-16 MED ORDER — CEFAZOLIN SODIUM-DEXTROSE 2-3 GM-%(50ML) IV SOLR
INTRAVENOUS | Status: DC | PRN
  Administered 2021-11-16: 2 g via INTRAVENOUS

## 2021-11-16 MED ORDER — SODIUM CHLORIDE 0.9 % IV SOLN: INTRAVENOUS | Status: DC

## 2021-11-16 MED ORDER — SODIUM CHLORIDE 0.9 % IV SOLN: INTRAVENOUS | Status: AC

## 2021-11-16 MED ORDER — CLEVIDIPINE BUTYRATE 0.5 MG/ML IV EMUL
INTRAVENOUS | Status: DC | PRN
  Administered 2021-11-16: 1 mg/h via INTRAVENOUS

## 2021-11-16 MED ORDER — FENTANYL 2500MCG IN NS 250ML (10MCG/ML) PREMIX INFUSION
25.0000 ug/h | INTRAVENOUS | Status: DC
  Administered 2021-11-16: 25 ug/h via INTRAVENOUS
  Filled 2021-11-16: qty 250

## 2021-11-16 MED ORDER — IOHEXOL 350 MG/ML SOLN
75.0000 mL | Freq: Once | INTRAVENOUS | Status: AC | PRN
  Administered 2021-11-16: 75 mL via INTRAVENOUS

## 2021-11-16 MED ORDER — FENTANYL BOLUS VIA INFUSION
25.0000 ug | INTRAVENOUS | Status: DC | PRN
  Filled 2021-11-16: qty 100

## 2021-11-16 MED ORDER — PROPOFOL 10 MG/ML IV BOLUS
INTRAVENOUS | Status: DC | PRN
  Administered 2021-11-16: 100 mg via INTRAVENOUS

## 2021-11-16 MED ORDER — TICAGRELOR 90 MG PO TABS
ORAL_TABLET | ORAL | Status: AC
  Administered 2021-11-16: 180 mg via OROGASTRIC
  Filled 2021-11-16: qty 2

## 2021-11-16 MED ORDER — ACETAMINOPHEN 160 MG/5ML PO SOLN: 650.0000 mg | ORAL | Status: DC | PRN

## 2021-11-16 MED ORDER — POLYETHYLENE GLYCOL 3350 17 G PO PACK
17.0000 g | PACK | Freq: Every day | ORAL | Status: DC
  Administered 2021-11-16: 17 g
  Filled 2021-11-16: qty 1

## 2021-11-16 MED ORDER — CANGRELOR TETRASODIUM 50 MG IV SOLR
INTRAVENOUS | Status: AC
  Filled 2021-11-16: qty 50

## 2021-11-16 MED ORDER — ONDANSETRON HCL 4 MG/2ML IJ SOLN
INTRAMUSCULAR | Status: DC | PRN
  Administered 2021-11-16: 4 mg via INTRAVENOUS

## 2021-11-16 MED ORDER — ENOXAPARIN SODIUM 40 MG/0.4ML IJ SOSY
40.0000 mg | PREFILLED_SYRINGE | Freq: Every day | INTRAMUSCULAR | Status: DC
  Administered 2021-11-17 – 2021-11-25 (×9): 40 mg via SUBCUTANEOUS
  Filled 2021-11-16 (×9): qty 0.4

## 2021-11-16 MED ORDER — CANGRELOR BOLUS VIA INFUSION
15.0000 ug/kg | Freq: Once | INTRAVENOUS | Status: AC
  Administered 2021-11-16: 1212 ug via INTRAVENOUS

## 2021-11-16 MED ORDER — ROSUVASTATIN CALCIUM 20 MG PO TABS
20.0000 mg | ORAL_TABLET | Freq: Every day | ORAL | Status: DC
  Administered 2021-11-17: 20 mg
  Filled 2021-11-16: qty 1

## 2021-11-16 MED ORDER — PANTOPRAZOLE SODIUM 40 MG IV SOLR
40.0000 mg | INTRAVENOUS | Status: DC
  Administered 2021-11-16: 40 mg via INTRAVENOUS
  Filled 2021-11-16: qty 10

## 2021-11-16 MED ORDER — SODIUM CHLORIDE 0.9 % IV SOLN
2.0000 ug/kg/min | INTRAVENOUS | Status: AC
  Administered 2021-11-16: 2 ug/kg/min via INTRAVENOUS

## 2021-11-16 MED ORDER — CLEVIDIPINE BUTYRATE 0.5 MG/ML IV EMUL
INTRAVENOUS | Status: AC
  Filled 2021-11-16: qty 50

## 2021-11-16 MED ORDER — NITROGLYCERIN 1 MG/10 ML FOR IR/CATH LAB
INTRA_ARTERIAL | Status: AC
  Filled 2021-11-16: qty 10

## 2021-11-16 MED ORDER — CHLORHEXIDINE GLUCONATE 0.12% ORAL RINSE (MEDLINE KIT)
15.0000 mL | Freq: Two times a day (BID) | OROMUCOSAL | Status: DC
  Administered 2021-11-16 – 2021-11-17 (×2): 15 mL via OROMUCOSAL

## 2021-11-16 MED ORDER — LIDOCAINE 2% (20 MG/ML) 5 ML SYRINGE
INTRAMUSCULAR | Status: DC | PRN
  Administered 2021-11-16: 100 mg via INTRAVENOUS

## 2021-11-16 MED ORDER — ORAL CARE MOUTH RINSE
15.0000 mL | OROMUCOSAL | Status: DC
  Administered 2021-11-16 – 2021-11-17 (×5): 15 mL via OROMUCOSAL

## 2021-11-16 MED ORDER — EZETIMIBE 10 MG PO TABS
10.0000 mg | ORAL_TABLET | Freq: Every day | ORAL | Status: DC
  Administered 2021-11-17: 10 mg
  Filled 2021-11-16: qty 1

## 2021-11-16 MED ORDER — PHENYLEPHRINE 40 MCG/ML (10ML) SYRINGE FOR IV PUSH (FOR BLOOD PRESSURE SUPPORT)
PREFILLED_SYRINGE | INTRAVENOUS | Status: DC | PRN
  Administered 2021-11-16: 80 ug via INTRAVENOUS

## 2021-11-16 MED ORDER — ROCURONIUM BROMIDE 10 MG/ML (PF) SYRINGE
PREFILLED_SYRINGE | INTRAVENOUS | Status: DC | PRN
  Administered 2021-11-16: 20 mg via INTRAVENOUS
  Administered 2021-11-16: 80 mg via INTRAVENOUS

## 2021-11-16 MED ORDER — ASPIRIN 81 MG PO CHEW
81.0000 mg | CHEWABLE_TABLET | Freq: Every day | ORAL | Status: DC
  Administered 2021-11-17: 81 mg
  Filled 2021-11-16: qty 1

## 2021-11-16 MED ORDER — TICAGRELOR 90 MG PO TABS
90.0000 mg | ORAL_TABLET | Freq: Two times a day (BID) | ORAL | Status: DC
  Administered 2021-11-17: 90 mg
  Filled 2021-11-16: qty 1

## 2021-11-16 MED ORDER — VERAPAMIL HCL 2.5 MG/ML IV SOLN
INTRAVENOUS | Status: AC
  Filled 2021-11-16: qty 2

## 2021-11-16 MED ORDER — ASPIRIN 81 MG PO CHEW
81.0000 mg | CHEWABLE_TABLET | Freq: Every day | ORAL | Status: DC
  Administered 2021-11-18 – 2021-11-25 (×8): 81 mg via ORAL
  Filled 2021-11-16 (×8): qty 1

## 2021-11-16 MED ORDER — INSULIN ASPART 100 UNIT/ML IJ SOLN
0.0000 [IU] | INTRAMUSCULAR | Status: DC
  Administered 2021-11-16: 3 [IU] via SUBCUTANEOUS
  Administered 2021-11-17 – 2021-11-18 (×2): 2 [IU] via SUBCUTANEOUS

## 2021-11-16 MED ORDER — TIROFIBAN HCL IN NACL 5-0.9 MG/100ML-% IV SOLN
INTRAVENOUS | Status: DC
Start: 2021-11-16 — End: 2021-11-16
  Filled 2021-11-16: qty 100

## 2021-11-16 MED ORDER — STROKE: EARLY STAGES OF RECOVERY BOOK
Freq: Once | Status: AC
  Filled 2021-11-16: qty 1

## 2021-11-16 MED ORDER — ASPIRIN 81 MG PO CHEW
CHEWABLE_TABLET | ORAL | Status: AC
  Administered 2021-11-16: 81 mg via OROGASTRIC
  Filled 2021-11-16: qty 1

## 2021-11-16 MED ORDER — ESCITALOPRAM OXALATE 10 MG PO TABS
10.0000 mg | ORAL_TABLET | Freq: Every day | ORAL | Status: DC
  Administered 2021-11-17: 10 mg
  Filled 2021-11-16: qty 1

## 2021-11-16 MED ORDER — ACETAMINOPHEN 160 MG/5ML PO SOLN
650.0000 mg | ORAL | Status: DC | PRN
Start: 2021-11-16 — End: 2021-11-18

## 2021-11-16 MED ORDER — EPTIFIBATIDE 20 MG/10ML IV SOLN
INTRAVENOUS | Status: AC
  Filled 2021-11-16: qty 10

## 2021-11-16 NOTE — ED Notes (Signed)
Pt unable to sign due to mental status. Pt family not at bedside.  ?

## 2021-11-16 NOTE — ED Notes (Signed)
To CT at this time.

## 2021-11-16 NOTE — ED Notes (Signed)
Pt family speaking with Dr. Quinn Axe via tele neuro cart ?

## 2021-11-16 NOTE — ED Notes (Signed)
From CT at this time and tele neuro at bedside evaluating patient ?

## 2021-11-16 NOTE — ED Notes (Signed)
Code stroke cart brought to room 1 and activated by this RN.  ?

## 2021-11-16 NOTE — H&P (Signed)
NEUROLOGY CONSULTATION NOTE  ? ?Date of service: November 16, 2021 ?Patient Name: Cassidy Hernandez ?MRN:  [id number] ?DOB:  04-02-1941 ? ?_ _ _   _ __   _ __ _ _  __ __   _ __   __ _ ? ?History of Present Illness  ?Cassidy Hernandez is a 81 y.o. female with PMH significant for PVD, CAD, HTN, HLD, DM2 who presented to [facility] ED with Global aphasia.  ? ?Daughter texted her and she was able to text her back at 2230. At 1400, her family spoke to her over phone and noted that she was slurring her words. Some concerns that symptoms started around 1300 but this information was provided by patient and family is not sure. ? ?She was evaluated at [facility] ED via tele by Dr. Livia Snellen, NIHS of 9. CTH with evolving L MCA stroke with ASPECTS of 6. CTA with L ICA bulky plaque and 80% stenosis in the neck along with non occlusive thrombus in the distal L MCA M1 and proximal M2. CTP with mismatch of 59m but upon review of MTT, demonstrated 725mdeficit with Tmax over 4s but only 12m29meficit with Tmax over 6s. ? ?Case was discussed with patient's daughter Ms. KomChryl Heckd she was transferred to [facility] thrombectomy and potential stenting. ? ? ?LKW: 2230 on 11/16/21. ?mRS: 2 ?tNKASE: not offered, she is outside the window. ?Thrombectomy: Dr. DevSallyanne Haversscussed details of the procedure along with risks and benefits of thrombectomy and stenting with patient's daughter and she agreed to the procedure. Daughter did not have any questions. I witnessed the entirety of the conversation. ? ?NIHSS components Score: Comment  ?1a Level of Conscious 0'[x]'$  1'[]'$  2'[]'$  3'[]'$      ?1b LOC Questions 0'[]'$  1'[]'$  2'[x]'$       ?1c LOC Commands 0'[]'$  1'[]'$  2'[x]'$       ?2 Best Gaze 0'[x]'$  1'[]'$  2'[]'$       ?3 Visual 0'[x]'$  1'[]'$  2'[]'$  3'[]'$      ?4 Facial Palsy 0'[]'$  1'[x]'$  2'[]'$  3'[]'$      ?5a Motor Arm - left 0'[x]'$  1'[]'$  2'[]'$  3'[]'$  4'[]'$  UN'[]'$    ?5b Motor Arm - Right 0'[x]'$  1'[]'$  2'[]'$  3'[]'$  4'[]'$  UN'[]'$    ?6a Motor Leg - Left 0'[]'$  1'[x]'$  2'[]'$  3'[]'$  4'[]'$  UN'[]'$    ?6b Motor Leg - Right 0'[]'$  1'[x]'$  2'[]'$  3'[]'$  4'[]'$  UN'[]'$    ?7 Limb Ataxia 0'[x]'$  1'[]'$  2'[]'$   3'[]'$  UN'[]'$     ?8 Sensory 0'[x]'$  1'[]'$  2'[]'$  UN'[]'$      ?9 Best Language 0'[]'$  1'[]'$  2'[]'$  3'[x]'$      ?10 Dysarthria 0'[]'$  1'[]'$  2'[x]'$  UN'[]'$      ?11 Extinct. and Inattention 0'[x]'$  1'[]'$  2'[]'$       ?TOTAL: 12   ? ? ?ROS  ? ?Unable to obtain review of system due to aphasia ? ?Past History  ? ?Past Medical History:  ?Diagnosis Date  ?? [place]inal [facility] ([place] ?? Aortic atherosclerosis   ?? [facility] ?? 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 a.) Doppler 06/62/94/76542%65%CA and 60%03%CA. b.) Doppler 05/54/65/681270>75%CA and 70%17%CA. c.) Doppler 10/25/16: >70% Bilater ICAs  ?? Chronic bilateral low back pain with left-sided sciatica   ?? CKD (chronic kidney disease), stage III ([facility] ?? Coronary artery disease   ? a.) LHC 12/24/2014 -> small LAD system; diffuse CTO of LAD. 60% RCA. LM and LCx with no sig disease. no intervention; med mgmt.  ?? Degenerative arthritis   ?? Diastolic dysfunction   ?  a.) TTE 09/20/2018: EF 55%, G1DD, mild LAE and RVE, triv-mild panvalvular regurgitation.  ?? Dysarthria   ?? GERD (gastroesophageal reflux disease)   ?? Hepatic steatosis   ?? History of kidney stones   ?? HLD (hyperlipidemia)   ?? Hypertension   ?? Kidney stone   ?? Lumbar radiculopathy   ?? Major depression in remission [facility])   ?? PVD (peripheral vascular disease) ([place])   ?? Renal cyst, right 06/02/2015  ? a.) CT 06/02/2015 --> 4.4 cm RIGHT renal mass; MRI recommended. b.) MRI 06/16/2015 --> 3.8 x 4.2 x 4.0 cm proteinaceous/hemmorhagic cyst.  ?? Subclavian steal syndrome   ? a.) LHC 12/24/2014 --> tight eccentric 90% ostial stenosis with damping.  ?? T2DM (type 2 diabetes mellitus) ([place])   ? ?Past Surgical History:  ?Procedure Laterality Date  ?? ABDOMINAL HYSTERECTOMY    ?? AUGMENTATION MAMMAPLASTY Bilateral 1980  ?? BACK SURGERY  09/2018  ?? BICEPT TENODESIS  06/10/2021  ? Procedure: BICEPS TENODESIS;  Surgeon: Corky Mull, MD;  Location: [facility] ORS;  Service: Orthopedics;;  ?? CARDIAC CATHETERIZATION Left 12/24/2014  ? Procedure: LEFT HEARD  CATHETERIZATION; Location: [facility]; Surgeon: Laurence Aly, MD  ?? CHOLECYSTECTOMY    ?? COLONOSCOPY N/A 02/11/2016  ? Procedure: COLONOSCOPY;  Surgeon: Manya Silvas, MD;  Location: [facility] ENDOSCOPY;  Service: Endoscopy;  Laterality: N/A;  ?? REVERSE SHOULDER ARTHROPLASTY Right 06/10/2021  ? Procedure: REVERSE SHOULDER ARTHROPLASTY;  Surgeon: Corky Mull, MD;  Location: [facility] ORS;  Service: Orthopedics;  Laterality: Right;  ? ?Family History  ?Problem Relation Age of Onset  ?? Heart attack Mother   ?? Heart disease Mother   ?? Heart attack Father   ?? Heart disease Father   ?? Breast cancer Neg Hx   ? ?Social History  ? ?Socioeconomic History  ?? Marital status: Married  ?  Spouse name: Not on file  ?? Number of children: Not on file  ?? Years of education: Not on file  ?? Highest education level: Not on file  ?Occupational History  ?? Not on file  ?Tobacco Use  ?? Smoking status: Never  ?? Smokeless tobacco: Never  ?Vaping Use  ?? Vaping Use: Never used  ?Substance and Sexual Activity  ?? Alcohol use: Never  ?? Drug use: Never  ?? Sexual activity: Not on file  ?Other Topics Concern  ?? Not on file  ?Social History Narrative  ?? Not on file  ? ?Social Determinants of Health  ? ?Financial Resource Strain: Not on file  ?Food Insecurity: Not on file  ?Transportation Needs: Not on file  ?Physical Activity: Not on file  ?Stress: Not on file  ?Social Connections: Not on file  ? ?Allergies  ?Allergen Reactions  ?? Sulfa Antibiotics Other (See Comments)  ? ? ?Medications  ?(Not in a hospital admission) ?  ? ?Vitals  ? ?There were no vitals filed for this visit.  ? ?There is no height or weight on file to calculate BMI. ? ?Physical Exam  ? ?General: Laying comfortably in bed; in no acute distress.  ?HENT: Normal oropharynx and mucosa. Normal external appearance of ears and nose.  ?Neck: Supple, no pain or tenderness  ?CV: No JVD. No peripheral edema.  ?Pulmonary: Symmetric Chest rise. Normal respiratory effort.  ?Abdomen: Soft  to touch, non-tender.  ?Ext: No cyanosis, edema, or deformity  ?Skin: No rash. Normal palpation of skin.   ?Musculoskeletal: Normal digits and nails by inspection. No clubbing.  ? ?Neurologic Examination  ?Mental status/Cognition: Alert, awake, does  not answer any orientation questions. ?Speech/language: dysarthric, hesitant, gets stuck on several words. Able to speak one word only and still makes paraphasic errors. Speech is unintelligible. Does not follow commands, even 1 step commands. Does seem to follow commands with visual cues and mimics. Unable to name objects, unable to repeat. ?Cranial nerves:  ? CN II Pupils equal and reactive to light, blinks to threat BL  ? CN III,IV,VI EOM intact to dolls eyes, no gaze preference or deviation, no nystagmus  ? CN V normal sensation in V1, V2, and V3 segments bilaterally, blink intact BL  ? CN VII no asymmetry, no nasolabial fold flattening, symmetric facial grimaces.  ? CN VIII Turns head towards speech  ? CN IX & X Unable to assess, definitely protecting her airway  ? CN XI Head is midline.  ? CN XII midline tongue but does not protrude on command.  ? ?Motor:  ?Muscle bulk: poor, tone normal, pronator drift none. No tremor ?Unable to do detailed strength testing secondary to aphasia.  When her bilateral upper extremities are held up in the ER, she will keep them up in the air for more than 10 seconds. ? ?Both of her lower extremities will drift down to the bed when held up in the air. ? ?Reflexes: ?Deferred given the acuity of the situation. ? ?Sensation: ?Grimaces to mild pinch in all extremities ? ?Coordination/Complex Motor:  ?Unable to assess but her movements do not appear significantly tremulous or ataxic. ?- Gait: deferred for patient safety. ? ?Labs  ? ?CBC:  ?Recent Labs  ?Lab 11/16/21 ?1624  ?WBC 14.6*  ?NEUTROABS 11.2*  ?HGB 13.3  ?HCT 40.5  ?MCV 87.5  ?PLT 267  ? ? ?Basic Metabolic Panel:  ?Lab Results  ?Component Value Date  ? NA 136 11/16/2021  ? K 4.0  11/16/2021  ? CO2 26 11/16/2021  ? GLUCOSE 94 11/16/2021  ? BUN 26 (H) 11/16/2021  ? CREATININE 0.99 11/16/2021  ? CALCIUM 9.2 11/16/2021  ? GFRNONAA 57 (L) 11/16/2021  ? GFRAA 58 (L) 04/19/2014  ? ?Lipid Panel: No

## 2021-11-16 NOTE — ED Notes (Signed)
Report called to Kennis Carina, RN ?

## 2021-11-16 NOTE — Procedures (Signed)
INR. ?Left common carotid arteriogram. ?Right CFA approach. ?Status post endovascular revascularization of occluded left middle cerebral artery distal M1 segment with 1 pass with 4 mm x 40 mm solitary device with proximal flow arrest achieving aTICI  3 revascularization. ?Status post endovascular revascularization of occluded left internal carotid artery proximally with stent assisted angioplasty, and proximal flow arrest. ?Post CT of the brain x2 no evidence of intracranial hemorrhage. ?Right groin hemostasis achieved with 8 French Angio-Seal closure device. ?Distal pulses dopplerable bilaterally. ? ?Patient left intubated for airway protection, and due to her preprocedural medical condition. ? ?Pupils 3 mm sluggishly reactive bilaterally ?Medications aspirin 81 mg a.m. Brilinta 180 mg via orogastric tube prior to left ICA angioplasty. ?Half bolus dose of cangrelor IV followed by 4-hour low-dose infusion. ? ?Plain CT of the brain at 1 AM.  Please inform neurohospitalist on-call when patient goes down for CT of the brain. ? ?Arlean Hopping MD. ?

## 2021-11-16 NOTE — Consult Note (Signed)
? ?NAME:  Cassidy Hernandez, MRN:  [id number], DOB:  1941/01/19, LOS: 0 ?ADMISSION DATE:  11/16/2021, CONSULTATION DATE:  4/11 ?REFERRING MD:  Dr. Lorrin Goodell, CHIEF COMPLAINT:    ? ?History of Present Illness:  ?Patient is encephalopathic and/or intubated. Therefore history has been obtained from chart review.  ?81 year old female with past medical history as below, which is significant for coronary artery disease, peripheral vascular disease, hypertension, hyperlipidemia, and diabetes who presented to [facility] on 4/11 with concern for stroke.  Family spoke to her over the phone and noted that she was slurring her words at approximately 2 PM.  Last known well at 1030 the night before.  Upon presentation to the ED NIHS was 9. CT of the head with evolving L MCA stroke. CTA with defects in the distal L MCA and proximal M2. The patient was transferred to [facility] for thrombectomy. Had revascularization of left MCA M1 with TICI 3 as well as angioplasty + stent in the L ICA. Post procedure the patient was transferred to the ICU on the ventilator and PCCM was consulted.  ? ?Pertinent  Medical History  ? has a past medical history of Anginal pain ([place]), Aortic atherosclerosis, Cancer ([place]), Carotid artery stenosis (02/01/2010), Chronic bilateral low back pain with left-sided sciatica, CKD (chronic kidney disease), stage III ([place]), Coronary artery disease, Degenerative arthritis, Diastolic dysfunction, Dysarthria, GERD (gastroesophageal reflux disease), Hepatic steatosis, History of kidney stones, HLD (hyperlipidemia), Hypertension, Kidney stone, Lumbar radiculopathy, Major depression in remission [facility]), PVD (peripheral vascular disease) ([place]), Renal cyst, right (06/02/2015), Subclavian steal syndrome, and T2DM (type 2 diabetes mellitus) ([place]). ? ? ?Significant Hospital Events: ?Including procedures, antibiotic start and stop dates in addition to other pertinent events   ? ? ?Interim History / Subjective:  ? ? ?Objective   ?Blood pressure  (!) 143/118, pulse 93, temperature (!) 97 ?F (36.1 ?C), temperature source Axillary, resp. rate 12, weight 78.5 kg, SpO2 98 %. ?   ?Vent Mode: PRVC ?FiO2 (%):  [40 %] 40 % ?Set Rate:  [18 bmp] 18 bmp ?Vt Set:  [420 mL] 420 mL ?PEEP:  [5 cmH20] 5 cmH20 ?Plateau Pressure:  [21 cmH20] 21 cmH20  ? ?Intake/Output Summary (Last 24 hours) at 11/16/2021 2241 ?Last data filed at 11/16/2021 2144 ?Gross per 24 hour  ?Intake 850 ml  ?Output 560 ml  ?Net 290 ml  ? ?Filed Weights  ? 11/16/21 2200  ?Weight: 78.5 kg  ? ? ?Examination: ?General: Elderly female in NAD on vent ?HENT:  PERRL, no JVD, dried blood from nose. No signs of active bleeding.  ?Lungs: Clear bilateral breath sounds ?Cardiovascular: RRR, no MRG ?Abdomen: Soft, non-distended ?Extremities: No acute deformity ?Neuro: Sedated RASS -2. Moving all four extremities to command. 4/5 strength.  ? ?Resolved Hospital Problem list   ? ? ?Assessment & Plan:  ? 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 S/p mechanical thrombectomy to left M1 with tici 3 and L ICA stenting ?- Management per neurology/stroke ?- Echo, MRI pending ?- SBP goal 120-140: cleviprex ?- Cangrelor infusion ? ?Endotracheal tube present ?- Full vent support ?- CXR/ABG ?- VAP protocol ?- SAT/SBT in AM ?- Fentanyl infusion for RASS goal -1 to -2.  ? ?DM ?- SSI ? ?HTN ?HLD ?- holding home meds  ?- plan as above ?- Continue home statin ? ? ?Best Practice (right click and "Reselect all SmartList Selections" daily)  ? ?Diet/type: NPO ?DVT prophylaxis: LMWH ?GI prophylaxis: PPI ?Lines: Arterial Line ?Foley:  Yes, and it is still  needed ?Code Status:  full code ?Last date of multidisciplinary goals of care discussion '[ ]'$  ? ?Labs   ?CBC: ?Recent Labs  ?Lab 11/16/21 ?1624  ?WBC 14.6*  ?NEUTROABS 11.2*  ?HGB 13.3  ?HCT 40.5  ?MCV 87.5  ?PLT 267  ? ? ?Basic Metabolic Panel: ?Recent Labs  ?Lab 11/16/21 ?1725  ?NA 136  ?K 4.0  ?CL 99  ?CO2 26  ?GLUCOSE 94  ?BUN 26*  ?CREATININE 0.99  ?CALCIUM 9.2  ? ?GFR: ?Estimated  Creatinine Clearance: 43.4 mL/min (by C-G formula based on SCr of 0.99 mg/dL). ?Recent Labs  ?Lab 11/16/21 ?1624  ?WBC 14.6*  ? ? ?Liver Function Tests: ?Recent Labs  ?Lab 11/16/21 ?1725  ?AST 30  ?ALT 20  ?ALKPHOS 56  ?BILITOT 1.0  ?PROT 7.5  ?ALBUMIN 3.9  ? ?No results for input(s): LIPASE, AMYLASE in the last 168 hours. ?No results for input(s): AMMONIA in the last 168 hours. ? ?ABG ?No results found for: PHART, PCO2ART, PO2ART, HCO3, TCO2, ACIDBASEDEF, O2SAT  ? ?Coagulation Profile: ?Recent Labs  ?Lab 11/16/21 ?1624  ?INR 1.1  ? ? ?Cardiac Enzymes: ?Recent Labs  ?Lab 11/16/21 ?1725  ?CKTOTAL 533*  ? ? ?HbA1C: ?No results found for: HGBA1C ? ?CBG: ?No results for input(s): GLUCAP in the last 168 hours. ? ?Review of Systems:   ?Patient is encephalopathic and/or intubated. Therefore history has been obtained from chart review.  ? ?Past Medical History:  ?She,  has a past medical history of Anginal pain ([place]), Aortic atherosclerosis, Cancer ([place]), Carotid artery stenosis (02/01/2010), Chronic bilateral low back pain with left-sided sciatica, CKD (chronic kidney disease), stage III ([place]), Coronary artery disease, Degenerative arthritis, Diastolic dysfunction, Dysarthria, GERD (gastroesophageal reflux disease), Hepatic steatosis, History of kidney stones, HLD (hyperlipidemia), Hypertension, Kidney stone, Lumbar radiculopathy, Major depression in remission [facility]), PVD (peripheral vascular disease) ([place]), Renal cyst, right (06/02/2015), Subclavian steal syndrome, and T2DM (type 2 diabetes mellitus) ([place]).  ? ?Surgical History:  ? ?Past Surgical History:  ?Procedure Laterality Date  ? ABDOMINAL HYSTERECTOMY    ? AUGMENTATION MAMMAPLASTY Bilateral 1980  ? BACK SURGERY  09/2018  ? BICEPT TENODESIS  06/10/2021  ? Procedure: BICEPS TENODESIS;  Surgeon: Corky Mull, MD;  Location: [facility] ORS;  Service: Orthopedics;;  ? CARDIAC CATHETERIZATION Left 12/24/2014  ? Procedure: LEFT HEARD CATHETERIZATION; Location: [facility]; Surgeon:  Laurence Aly, MD  ? CHOLECYSTECTOMY    ? COLONOSCOPY N/A 02/11/2016  ? Procedure: COLONOSCOPY;  Surgeon: Manya Silvas, MD;  Location: [facility] ENDOSCOPY;  Service: Endoscopy;  Laterality: N/A;  ? REVERSE SHOULDER ARTHROPLASTY Right 06/10/2021  ? Procedure: REVERSE SHOULDER ARTHROPLASTY;  Surgeon: Corky Mull, MD;  Location: [facility] ORS;  Service: Orthopedics;  Laterality: Right;  ?  ? ?Social History:  ? reports that she has never smoked. She has never used smokeless tobacco. She reports that she does not drink alcohol and does not use drugs.  ? ?Family History:  ?Her family history includes Heart attack in her father and mother; Heart disease in her father and mother. There is no history of Breast cancer.  ? ?Allergies ?Allergies  ?Allergen Reactions  ? Sulfa Antibiotics Other (See Comments)  ?  ? ?Home Medications  ?Prior to Admission medications   ?Medication Sig Start Date End Date Taking? Authorizing Provider  ?acetaminophen (TYLENOL) 325 MG tablet Take 650 mg by mouth every 8 (eight) hours as needed for moderate pain or mild pain (Back).    [provider]  ?amLODipine (NORVASC) 5 MG tablet Take 5  mg by mouth daily.  12/04/14   [provider]  ?aspirin EC 325 MG tablet Take 1 tablet (325 mg total) by mouth daily. 06/11/21   Lattie Corns, PA-C  ?carvedilol (COREG) 3.125 MG tablet Take 3.125 mg by mouth 2 (two) times daily. 10/05/19   [provider]  ?chlorthalidone (HYGROTON) 25 MG tablet Take 12.5 mg by mouth daily. 09/23/14   [provider]  ?docusate sodium (COLACE) 100 MG capsule Take 1 capsule (100 mg total) by mouth 2 (two) times daily. 06/11/21   Lattie Corns, PA-C  ?escitalopram (LEXAPRO) 10 MG tablet Take 10 mg by mouth daily. 05/13/21   [provider]  ?ezetimibe (ZETIA) 10 MG tablet Take 10 mg by mouth daily. 06/23/18   [provider]  ?isosorbide mononitrate (IMDUR) 60 MG 24 hr tablet Take 60 mg by mouth daily.    [provider]  ?losartan (COZAAR) 100 MG tablet Take 100 mg by mouth daily.     [provider]  ?magnesium chloride (SLOW-MAG) 64 MG TBEC SR tablet Take 1 tablet by mouth daily.    [provider]

## 2021-11-16 NOTE — Consult Note (Signed)
1714-Code stroke called ?1716-Dr Quinn Axe to camera ?

## 2021-11-16 NOTE — Anesthesia Preprocedure Evaluation (Addendum)
Anesthesia Evaluation  ?Patient identified by MRN, date of birth, ID band ?Patient awake ? ?General Assessment Comment: ?Awake, aphasic ? ? ?Reviewed: ?Allergy & Precautions, Patient's Chart, lab work & pertinent test results, Unable to perform ROS - Chart review onlyPreop documentation limited or incomplete due to emergent nature of procedure. ? ?History of Anesthesia Complications ?Negative for: history of anesthetic complications ? ?Airway ?Mallampati: III ? ?TM Distance: >3 FB ? ? ? ? Dental ?  ?Pulmonary ? ?  ?Pulmonary exam normal ? ? ? ? ? ? ? Cardiovascular ?hypertension, Pt. on medications ?+ CAD and + Peripheral Vascular Disease  ? ?Rhythm:Regular Rate:Tachycardia ? ? ?Hx subclavian steal syndrome ?Carotid a. Stenosis ? ?'21 Vascular US - >70% stenosis in b/l ICAs. There is retrograde flow noted in the left vertebral artery and asymmetric blood pressures?(right >left) consistent with subclavian steal.  Elevated velocities at the origin of the right subclavian and right external carotid arteries consistent with greater than 50% stenosis.  ? ? ?  ?Neuro/Psych ?PSYCHIATRIC DISORDERS Depression CVA, Residual Symptoms   ? GI/Hepatic ?GERD  Medicated,  ?Endo/Other  ?diabetes, Type 2 ?Obesity ? ? Renal/GU ?CRFRenal disease  ? ?  ?Musculoskeletal ? ?(+) Arthritis ,  ? Abdominal ?  ?Peds ? Hematology ?  ?Anesthesia Other Findings ? ? Reproductive/Obstetrics ? ?  ? ? ? ? ? ? ? ? ? ? ? ? ? ?  ?  ? ? ? ? ? ? ? ?Anesthesia Physical ?Anesthesia Plan ? ?ASA: 4 and emergent ? ?Anesthesia Plan: General  ? ?Post-op Pain Management: Minimal or no pain anticipated  ? ?Induction: Intravenous and Rapid sequence ? ?PONV Risk Score and Plan: 3 and Treatment may vary due to age or medical condition and Ondansetron ? ?Airway Management Planned: Oral ETT ? ?Additional Equipment: Arterial line ? ?Intra-op Plan:  ? ?Post-operative Plan: Possible Post-op intubation/ventilation ? ?Informed Consent:   ? ? ? ?History available from chart only ? ?Plan Discussed with: CRNA and Anesthesiologist ? ?Anesthesia Plan Comments:   ? ? ? ? ? ?Anesthesia Quick Evaluation ? ?

## 2021-11-16 NOTE — Progress Notes (Signed)
Patient ID: Cassidy Hernandez, female   DOB: 04-Jan-1941, 81 y.o.   MRN: [id number] ?INR ?81 year old right-handed lady.  Last seen well 20 to 30 hours yesterday. ?New onset of global aphasia.  Modified Rankin score of 2. ?CT of the brain no evidence of intracranial hemorrhage.  Aspect score of 6.  CT angiogram of the head and neck demonstrates high-grade proximal left ICA stenosis which walls occluded at the time of treatment.  Also left middle cerebral artery distal M1 near occlusive thrombus. ?CT perfusion of the brain probably unreliable given the patient's onset of symptoms. ? ?Endovascular treatment discussed with daughter in a three-way call with neurohospitalist. ? ?The procedure, the reasons, alternatives and potential complications of 86% of intracranial hemorrhage with worsening neurological status and inability to revascularize reviewed.  Daughter expressed understanding and provided verbal witnessed consent to proceed with treatment. ? ?Arlean Hopping MD. ?

## 2021-11-16 NOTE — ED Notes (Signed)
Called [facility] for transport to [facility]  ?

## 2021-11-16 NOTE — Anesthesia Procedure Notes (Signed)
Procedure Name: Intubation ?Date/Time: 11/16/2021 7:07 PM ?Performed by: Alain Marion, CRNA ?Pre-anesthesia Checklist: Patient identified, Emergency Drugs available, Suction available and Patient being monitored ?Patient Re-evaluated:Patient Re-evaluated prior to induction ?Oxygen Delivery Method: Circle System Utilized ?Preoxygenation: Pre-oxygenation with 100% oxygen ?Induction Type: IV induction ?Ventilation: Mask ventilation without difficulty ?Laryngoscope Size: Sabra Heck and 2 ?Grade View: Grade I ?Tube type: Oral ?Tube size: 7.0 mm ?Number of attempts: 1 ?Airway Equipment and Method: Stylet ?Placement Confirmation: ETT inserted through vocal cords under direct vision, positive ETCO2 and breath sounds checked- equal and bilateral ?Secured at: 21 cm ?Tube secured with: Tape ?Dental Injury: Teeth and Oropharynx as per pre-operative assessment  ? ? ? ? ?

## 2021-11-16 NOTE — Consult Note (Signed)
NEUROLOGY TELECONSULTATION NOTE  ? ?Date of service: November 16, 2021 ?Patient Name: Cassidy Hernandez ?MRN:  [id number] ?DOB:  Mar 17, 1941 ?Reason for consult: aphasia ? ?Requesting Provider: Dr. Nance Pear ?Consult Participants: myself, patient, bedside RN, telestroke RN ?Location of the provider: [place], [place] ?Location of the patient: [place] ? ?This consult was provided via telemedicine with 2-way video and audio communication. The patient/family was informed that care would be provided in this way and agreed to receive care in this manner.  ? ?_ _ _   _ __   _ __ _ _  __ __   _ __   __ _ ? ?History of Present Illness  ? ?This is an 81 yo patient with hx carotid stenosis, CAD, PVD, CKD, HTN, HL, DM2 who is BIB EMS after being found down in her home with global aphasia. LKW 2100 last night when family spoke to her by phone. When they found her she could not speak or follow commands, and had difficulty standing but was not focally weak on one side. On arrival to ED NIHSS = 9 all points related to speech. CT head showed evolving L MCA infarct with ASPECTS of 6. TNK not administered 2/2 presentation outside of the window. CTA showed non-occlusive thrombus distal L M1 and prox M2. 80% stenosis L ICA above the bifurcation with v small caliber L ICA above that stenosis. 50% R ICA stenosis. CT perfusion showed delayed perfusion in R temporal lobe but only 39m perfusion deficit with Tmax >6.0s. CBF 017m Imaging personally reviewed and discussed with neuroradiology and neuro-IR. At baseline patient is independent with no cognitive deficits and walks slowly 2/2 gait instability but does not require an assistive device to ambulate (mRS = 2). ? ?  ?ROS  ? ?UTA 2/2 aphasia ? ?Past History  ? ?The following was personally reviewed: ? ?Past Medical History:  ?Diagnosis Date  ? [place]  ? Aortic atherosclerosis   ? [facility]  ? 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 a.) Doppler 0600/76/22637233%ICA and 6054%ICA. b.)  Doppler 0556/25/6389>7>37%ICA and 7034%ICA. c.) Doppler 10/25/16: >70% Bilater ICAs  ? Chronic bilateral low back pain with left-sided sciatica   ? CKD (chronic kidney disease), stage III ([facility]  ? Coronary artery disease   ? a.) LHC 12/24/2014 -> small LAD system; diffuse CTO of LAD. 60% RCA. LM and LCx with no sig disease. no intervention; med mgmt.  ? Degenerative arthritis   ? Diastolic dysfunction   ? a.) TTE 09/20/2018: EF 55%, G1DD, mild LAE and RVE, triv-mild panvalvular regurgitation.  ? Dysarthria   ? GERD (gastroesophageal reflux disease)   ? Hepatic steatosis   ? History of kidney stones   ? HLD (hyperlipidemia)   ? Hypertension   ? Kidney stone   ? Lumbar radiculopathy   ? [facility]  ? PVD [place]peripheral vascular disease) ([facility]  ? Renal cyst, right 06/02/2015  ? a.) CT 06/02/2015 --> 4.4 cm RIGHT renal mass; MRI recommended. b.) MRI 06/16/2015 --> 3.8 x 4.2 x 4.0 cm proteinaceous/hemmorhagic cyst.  ? Subclavian steal syndrome   ? a.) LHC 12/24/2014 --> tight eccentric 90% ostial stenosis with damping.  ? T2DM (type 2 diabetes mellitus) ([facility]  ? ?Past Surgical History:  ?Procedure Laterality Date  ? ABDOMINAL HYSTERECTOMY    ? AUGMENTATION MAMMAPLASTY Bilateral 1980  ? BACK SURGERY  09/2018  ? BICEPT TENODESIS  06/10/2021  ?  Procedure: BICEPS TENODESIS;  Surgeon: Corky Mull, MD;  Location: [facility] ORS;  Service: Orthopedics;;  ? CARDIAC CATHETERIZATION Left 12/24/2014  ? Procedure: LEFT HEARD CATHETERIZATION; Location: [facility]; Surgeon: Laurence Aly, MD  ? CHOLECYSTECTOMY    ? COLONOSCOPY N/A 02/11/2016  ? Procedure: COLONOSCOPY;  Surgeon: Manya Silvas, MD;  Location[facility];  Service: Endoscopy;  Laterality: N/A;  ? REVERSE SHOULDER ARTHROPLASTY Right 06/10/2021  ? Procedure: REVERSE SHOULDER ARTHROPLASTY;  Surgeon: Corky Mull, MD;  Location: [facility] ORS;  Service: Orthopedics;  Laterality: Right;  ? ?Family History  ?Problem Relation Age of Onset  ? Heart attack Mother    ? Heart disease Mother   ? Heart attack Father   ? Heart disease Father   ? Breast cancer Neg Hx   ? ?Social History  ? ?Socioeconomic History  ? Marital status: Married  ?  Spouse name: Not on file  ? Number of children: Not on file  ? Years of education: Not on file  ? Highest education level: Not on file  ?Occupational History  ? Not on file  ?Tobacco Use  ? Smoking status: Never  ? Smokeless tobacco: Never  ?Vaping Use  ? Vaping Use: Never used  ?Substance and Sexual Activity  ? Alcohol use: Never  ? Drug use: Never  ? Sexual activity: Not on file  ?Other Topics Concern  ? Not on file  ?Social History Narrative  ? Not on file  ? ?Social Determinants of Health  ? ?Financial Resource Strain: Not on file  ?Food Insecurity: Not on file  ?Transportation Needs: Not on file  ?Physical Activity: Not on file  ?Stress: Not on file  ?Social Connections: Not on file  ? ?Allergies  ?Allergen Reactions  ? Sulfa Antibiotics Other (See Comments)  ? ? ?Medications  ? ?(Not in a hospital admission) ?  ? ?No current facility-administered medications for this encounter. ? ?Current Outpatient Medications:  ?  acetaminophen (TYLENOL) 325 MG tablet, Take 650 mg by mouth every 8 (eight) hours as needed for moderate pain or mild pain (Back)., Disp: , Rfl:  ?  amLODipine (NORVASC) 5 MG tablet, Take 5 mg by mouth daily. , Disp: , Rfl:  ?  aspirin EC 325 MG tablet, Take 1 tablet (325 mg total) by mouth daily., Disp: 30 tablet, Rfl: 0 ?  carvedilol (COREG) 3.125 MG tablet, Take 3.125 mg by mouth 2 (two) times daily., Disp: , Rfl:  ?  chlorthalidone (HYGROTON) 25 MG tablet, Take 12.5 mg by mouth daily., Disp: , Rfl:  ?  docusate sodium (COLACE) 100 MG capsule, Take 1 capsule (100 mg total) by mouth 2 (two) times daily., Disp: 10 capsule, Rfl: 0 ?  escitalopram (LEXAPRO) 10 MG tablet, Take 10 mg by mouth daily., Disp: , Rfl:  ?  ezetimibe (ZETIA) 10 MG tablet, Take 10 mg by mouth daily., Disp: , Rfl: 10 ?  isosorbide mononitrate (IMDUR) 60  MG 24 hr tablet, Take 60 mg by mouth daily., Disp: , Rfl:  ?  losartan (COZAAR) 100 MG tablet, Take 100 mg by mouth daily. , Disp: , Rfl:  ?  magnesium chloride (SLOW-MAG) 64 MG TBEC SR tablet, Take 1 tablet by mouth daily., Disp: , Rfl:  ?  Multiple Vitamin (MULTIVITAMIN) capsule, Take 1 capsule by mouth daily., Disp: , Rfl:  ?  nitroGLYCERIN (NITROSTAT) 0.4 MG SL tablet, Place 0.4 mg under the tongue every 5 (five) minutes as needed for chest pain., Disp: , Rfl:  ?  omeprazole (PRILOSEC) 20 MG capsule, Take 20 mg by mouth daily. , Disp: , Rfl:  ?  ondansetron (ZOFRAN) 4 MG tablet, Take 1 tablet (4 mg total) by mouth every 6 (six) hours as needed for nausea., Disp: 30 tablet, Rfl: 0 ?  oxyCODONE (OXY IR/ROXICODONE) 5 MG immediate release tablet, Take 1-2 tablets (5-10 mg total) by mouth every 4 (four) hours as needed for moderate pain (pain score 4-6)., Disp: 30 tablet, Rfl: 0 ?  rosuvastatin (CRESTOR) 20 MG tablet, Take 20 mg by mouth daily. , Disp: , Rfl:  ?  traMADol (ULTRAM) 50 MG tablet, Take 1 tablet (50 mg total) by mouth every 6 (six) hours as needed for moderate pain., Disp: 30 tablet, Rfl: 0 ? ?Vitals  ? ?Vitals:  ? 11/16/21 1614 11/16/21 1618 11/16/21 1630 11/16/21 1659  ?BP: (!) 142/99  124/71   ?Pulse: 99  100   ?Resp: 19  (!) 22 20  ?Temp: 97.9 ?F (36.6 ?C)     ?TempSrc: Oral     ?SpO2: 100%  93% 95%  ?Weight:  80.8 kg    ?Height:  '5\' 3"'$  (1.6 m)    ?  ? ?Body mass index is 31.55 kg/m?. ? ?Physical Exam  ? ?Exam performed over telemedicine with 2-way video and audio communication and with assistance of bedside RN ? ?Physical Exam ?Gen: A&O x4, NAD ?Resp: normal WOB ?CV: extremities appear well-perfused ? ?Neuro: ?*MS: A&O x4. Follows multi-step commands.  ?*Speech: nondysarthric, no aphasia, able to name and repeat ?*CN: PERRL 3m, EOMI, VFF by confrontation, sensation intact, smile symmetric, hearing intact to voice ?*Motor:   Normal bulk.  No tremor, rigidity or bradykinesia. No pronator drift. All  extremities appear full-strength and symmetric. ?*Sensory: SILT. Symmetric. No double-simultaneous extinction.  ?*Coordination:  Finger-to-nose, heel-to-shin, rapid alternating motions were intact. ?*Refl

## 2021-11-16 NOTE — ED Provider Notes (Signed)
? ?[facility] ?Provider Note ? ? ? Event Date/Time  ? First MD Initiated Contact with Patient 11/16/21 1602   ?  (approximate) ? ? ?History  ? ?Aphasia ? ? ?HPI ? ?Cassidy Hernandez is a 81 y.o. female  who, per office visit note dated 10/29/2021 has history of CAD, carotid artery stenosis, DM, HLD, who presents to the emergency department today because of concern for aphagia and weakness after being found on the floor. The patient was last seen by family last night around 9pm. Patient unfortunately cannot give any significant history given aphagia. When family found her this afternoon she was in the bathroom and on the floor. The patient was noted to have slurred speech and family appreciated right sided facial weakness.  ? ?Physical Exam  ? ?Triage Vital Signs: ?ED Triage Vitals  ?Enc Vitals Group  ?   BP 11/16/21 1614 (!) 142/99  ?   Pulse Rate 11/16/21 1614 99  ?   Resp 11/16/21 1614 19  ?   Temp 11/16/21 1614 97.9 ?F (36.6 ?C)  ?   Temp Source 11/16/21 1614 Oral  ?   SpO2 11/16/21 1614 100 %  ?   Weight 11/16/21 1618 178 lb 2.1 oz (80.8 kg)  ?   Height 11/16/21 1618 '5\' 3"'$  (1.6 m)  ? ?Most recent vital signs: ?Vitals:  ? 11/16/21 1614  ?BP: (!) 142/99  ?Pulse: 99  ?Resp: 19  ?Temp: 97.9 ?F (36.6 ?C)  ?SpO2: 100%  ? ? ?General: Awake, no distress.  ?CV:  Good peripheral perfusion.  ?Resp:  Normal effort.  ?Abd:  No distention.  ?Neuro:  Awake, alert. Aphagia. Able to follow commands. Strength 5/5 in extremities.   ? ? ?ED Results / Procedures / Treatments  ? ?Labs ?(all labs ordered are listed, but only abnormal results are displayed) ?Labs Reviewed  ?APTT - Abnormal; Notable for the following components:  ?    Result Value  ? aPTT 21 (*)   ? All other components within normal limits  ?CBC - Abnormal; Notable for the following components:  ? WBC 14.6 (*)   ? All other components within normal limits  ?DIFFERENTIAL - Abnormal; Notable for the following components:  ? Neutro Abs 11.2 (*)   ?  Monocytes Absolute 1.2 (*)   ? Abs Immature Granulocytes 0.09 (*)   ? All other components within normal limits  ?URINALYSIS, ROUTINE W REFLEX MICROSCOPIC - Abnormal; Notable for the following components:  ? Color, Urine YELLOW (*)   ? APPearance CLOUDY (*)   ? Hgb urine dipstick SMALL (*)   ? Protein, ur >=300 (*)   ? Bacteria, UA MANY (*)   ? All other components within normal limits  ?RESP PANEL BY RT-PCR (FLU A&B, COVID) ARPGX2  ?PROTIME-INR  ?CK  ?COMPREHENSIVE METABOLIC PANEL  ?TROPONIN I (HIGH SENSITIVITY)  ?TROPONIN I (HIGH SENSITIVITY)  ? ? ? ?EKG ? ?INance Pear, attending physician, personally viewed and interpreted this EKG ? ?EKG Time: 1618 ?Rate: 103 ?Rhythm: sinus tachycardia ?Axis: normal ?Intervals: qtc 426 ?QRS: narrow, q waves v1 ?ST changes: no st elevation ?Impression: abnormal ekg ? ?RADIOLOGY ?I independently interpreted and visualized the ct head. My interpretation: left sided cva. No bleed. ?Radiology interpretation:  ?IMPRESSION:  ?1. Acute left MCA infarct involving the left temporal lobe and  ?temporoparietal lobe and left basal ganglia. No acute intracranial  ?hemorrhage. Hyperdense left MCA compatible with acute thrombus.  ?2. ASPECTS is 6.  ?3. Atrophy  and moderate chronic microvascular ischemic change  ?throughout the white matter.  ?4. Code stroke imaging results were communicated on 11/16/2021 at  ?5:29 pm to provider Quinn Axe via text page  ?   ? ?CT angio ?IMPRESSION:  ?1. Thrombus in the distal left M1 segment and proximal M2 segments.  ?There is good perfusion distal to the thrombus.  ?2. Atherosclerotic calcification left carotid bifurcation with  ?approximately 50% diameter stenosis. Approximately 80% diameter  ?stenosis left internal carotid artery above the bifurcation due to  ?heavily calcified plaque. Small caliber left internal carotid artery  ?above this stenosis.  ?3. 50% diameter stenosis proximal right internal carotid artery  ?4. Mild to moderate stenosis origin of  left vertebral artery. Right  ?vertebral artery widely patent  ?5. CT head performed earlier demonstrates cytotoxic edema left  ?temporal lobe and left temporoparietal lobe and left basal ganglia  ?compatible with acute infarct. The CT perfusion does not demonstrate  ?infarct in this area likely due to timing of the study and  ?completion of infarct.  ?6. These results were called by telephone at the time of  ?interpretation on 11/16/2021 at 6:02 pm to provider [facility] ,  ?who verbally acknowledged these results.  ? ? ? ?PROCEDURES: ? ?Critical Care performed: No ? ?Procedures ? ? ?MEDICATIONS ORDERED IN ED: ?Medications - No data to display ? ? ?IMPRESSION / MDM / ASSESSMENT AND PLAN / ED COURSE  ?I reviewed the triage vital signs and the nursing notes. ?             ?               ? ?Differential diagnosis includes, but is not limited to, CVA, pharyngitis, electrolyte abnormality, anemia. ? ?Patient was brought to the emergency department today because of concerns for aphasia.  Initial report was that patient was last contacted by family yesterday evening.  Because of this patient would be outside the window for tPA.  Did have concerns for possible LVO so CT angio was ordered.  Family then did arrive to the emergency department.  They did confirm the last time family spoke to the patient was yesterday evening.  However the they stated that after talking the patient emergency department she stated that this happened between 1 or 2:00.  I cannot get a reliable history from the patient.  However given family's statement a code stroke was called to have neurology evaluate for possible thrombolysis.  Neurology evaluated the patient and also had concern for possible LVO.  CT angio did show LVO.  Discussed with Dr. Quinn Axe with neurology, who did not think patient would be candidate for thrombolysis but did recommend interventional management.  Patient will be transported to [facility]. ? ? ?FINAL CLINICAL  IMPRESSION(S) / ED DIAGNOSES  ? ?Final diagnoses:  ?Cerebrovascular accident (CVA), unspecified mechanism ([place])  ?Aphagia  ? ? ? ?Note:  This document was prepared using Dragon voice recognition software and may include unintentional dictation errors. ? ?  ?Nance Pear, MD ?11/16/21 [id number] ? ?

## 2021-11-16 NOTE — Transfer of Care (Signed)
Immediate Anesthesia Transfer of Care Note ? ?Patient: Cassidy Hernandez ? ?Procedure(s) Performed: IR WITH ANESTHESIA- CODE STROKE ? ?Patient Location: 4N ICU ? ?Anesthesia Type:General ? ?Level of Consciousness: Patient sedated on vent ? ?Airway & Oxygen Therapy: Patient remains intubated per anesthesia plan and Patient placed on Ventilator (see vital sign flow sheet for setting) ? ?Post-op Assessment: Report given to RN and Post -op Vital signs reviewed and stable ? ?Post vital signs: Reviewed and stable ? ?Last Vitals:  ?Vitals Value Taken Time  ?BP 148/70 11/16/21 2203  ?Temp    ?Pulse 92 11/16/21 2206  ?Resp 18 11/16/21 2206  ?SpO2 97 % 11/16/21 2206  ?Vitals shown include unvalidated device data. ? ?Last Pain:  ?Vitals:  ? 11/16/21 2150  ?TempSrc: Axillary  ?   ? ?  ? ?Complications: No notable events documented. ?

## 2021-11-16 NOTE — ED Notes (Signed)
Called [facility] Code Stroke spoke to [place] ?

## 2021-11-16 NOTE — ED Triage Notes (Signed)
Pt from home for fall sometime last night. Pt lives alone and last talked to daughter around 58 PM. Found today on ground with R sided weakness, facial droop, and garbled speech. Pt able to answer yes/no questions but difficult to understand beyond that. Pt denies blood thinner use. Pt had another fall approx 2 weeks ago and has bruising to forehead, R elbow, and chest. Pt in NAD at this time and denies pain.  ?

## 2021-11-16 NOTE — Anesthesia Postprocedure Evaluation (Signed)
Anesthesia Post Note ? ?Patient: HARGUN SPURLING ? ?Procedure(s) Performed: IR WITH ANESTHESIA- CODE STROKE ? ?  ? ?Patient location during evaluation: ICU ?Anesthesia Type: General ?Level of consciousness: sedated and patient remains intubated per anesthesia plan ?Pain management: pain level controlled ?Vital Signs Assessment: post-procedure vital signs reviewed and stable ?Respiratory status: patient remains intubated per anesthesia plan ?Cardiovascular status: stable ?Postop Assessment: no apparent nausea or vomiting ?Anesthetic complications: no ? ? ?No notable events documented. ? ?Last Vitals:  ?Vitals:  ? 11/16/21 2157 11/16/21 2200  ?BP: (!) 149/71 (!) 143/118  ?Pulse: 90 93  ?Resp: 18 12  ?Temp:    ?SpO2: 98% 98%  ?  ?Last Pain:  ?Vitals:  ? 11/16/21 2150  ?TempSrc: Axillary  ? ? ?  ?  ?  ?  ?  ?  ? ?Audry Pili ? ? ? ? ?

## 2021-11-16 NOTE — ED Notes (Signed)
Attempted to call report x 3 to IR lab with no answer. Will continue to attempt to reach care team.  ?

## 2021-11-16 NOTE — ED Notes (Signed)
Code Stroke initiated by Dr. Archie Balboa 1705 ?

## 2021-11-17 ENCOUNTER — Inpatient Hospital Stay (HOSPITAL_COMMUNITY): Payer: Medicare Other

## 2021-11-17 ENCOUNTER — Encounter (HOSPITAL_COMMUNITY): Payer: Self-pay | Admitting: Radiology

## 2021-11-17 DIAGNOSIS — I63512 Cerebral infarction due to unspecified occlusion or stenosis of left middle cerebral artery: Secondary | ICD-10-CM

## 2021-11-17 LAB — GLUCOSE, CAPILLARY
Glucose-Capillary: 102 mg/dL — ABNORMAL HIGH (ref 70–99)
Glucose-Capillary: 116 mg/dL — ABNORMAL HIGH (ref 70–99)
Glucose-Capillary: 131 mg/dL — ABNORMAL HIGH (ref 70–99)
Glucose-Capillary: 95 mg/dL (ref 70–99)
Glucose-Capillary: 98 mg/dL (ref 70–99)
Glucose-Capillary: 99 mg/dL (ref 70–99)

## 2021-11-17 LAB — BASIC METABOLIC PANEL
Anion gap: 7 (ref 5–15)
BUN: 23 mg/dL (ref 8–23)
CO2: 23 mmol/L (ref 22–32)
Calcium: 8.3 mg/dL — ABNORMAL LOW (ref 8.9–10.3)
Chloride: 106 mmol/L (ref 98–111)
Creatinine, Ser: 1.07 mg/dL — ABNORMAL HIGH (ref 0.44–1.00)
GFR, Estimated: 52 mL/min — ABNORMAL LOW (ref >60)
Glucose, Bld: 116 mg/dL — ABNORMAL HIGH (ref 70–99)
Potassium: 4.1 mmol/L (ref 3.5–5.1)
Sodium: 136 mmol/L (ref 135–145)

## 2021-11-17 LAB — CBC WITH DIFFERENTIAL/PLATELET
Abs Immature Granulocytes: 0.11 10*3/uL — ABNORMAL HIGH (ref 0.00–0.07)
Basophils Absolute: 0.1 10*3/uL (ref 0.0–0.1)
Basophils Relative: 0 %
Eosinophils Absolute: 0 10*3/uL (ref 0.0–0.5)
Eosinophils Relative: 0 %
HCT: 34.6 % — ABNORMAL LOW (ref 36.0–46.0)
Hemoglobin: 11.6 g/dL — ABNORMAL LOW (ref 12.0–15.0)
Immature Granulocytes: 1 %
Lymphocytes Relative: 7 %
Lymphs Abs: 1.4 10*3/uL (ref 0.7–4.0)
MCH: 29.8 pg (ref 26.0–34.0)
MCHC: 33.5 g/dL (ref 30.0–36.0)
MCV: 88.9 fL (ref 80.0–100.0)
Monocytes Absolute: 2 10*3/uL — ABNORMAL HIGH (ref 0.1–1.0)
Monocytes Relative: 10 %
Neutro Abs: 15.9 10*3/uL — ABNORMAL HIGH (ref 1.7–7.7)
Neutrophils Relative %: 82 %
Platelets: 233 10*3/uL (ref 150–400)
RBC: 3.89 MIL/uL (ref 3.87–5.11)
RDW: 14.2 % (ref 11.5–15.5)
WBC: 19.5 10*3/uL — ABNORMAL HIGH (ref 4.0–10.5)
nRBC: 0 % (ref 0.0–0.2)

## 2021-11-17 LAB — ECHOCARDIOGRAM COMPLETE
AR max vel: 2.09 cm2
AV Peak grad: 8.7 mmHg
Ao pk vel: 1.48 m/s
Area-P 1/2: 3.37 cm2
Height: 63 in
S' Lateral: 1.9 cm
Weight: 2768.98 oz

## 2021-11-17 LAB — LIPID PANEL
Cholesterol: 99 mg/dL (ref 0–200)
HDL: 47 mg/dL (ref >40)
LDL Cholesterol: 20 mg/dL (ref 0–99)
Total CHOL/HDL Ratio: 2.1 RATIO
Triglycerides: 159 mg/dL — ABNORMAL HIGH (ref <150)
VLDL: 32 mg/dL (ref 0–40)

## 2021-11-17 LAB — HEMOGLOBIN A1C
Hgb A1c MFr Bld: 6.5 % — ABNORMAL HIGH (ref 4.8–5.6)
Mean Plasma Glucose: 139.85 mg/dL

## 2021-11-17 LAB — MRSA NEXT GEN BY PCR, NASAL: MRSA by PCR Next Gen: NOT DETECTED

## 2021-11-17 MED ORDER — ROSUVASTATIN CALCIUM 20 MG PO TABS
20.0000 mg | ORAL_TABLET | Freq: Every day | ORAL | Status: DC
  Administered 2021-11-18 – 2021-11-25 (×8): 20 mg via ORAL
  Filled 2021-11-17 (×8): qty 1

## 2021-11-17 MED ORDER — PANTOPRAZOLE SODIUM 40 MG IV SOLR
40.0000 mg | INTRAVENOUS | Status: DC
  Administered 2021-11-17: 40 mg via INTRAVENOUS
  Filled 2021-11-17: qty 10

## 2021-11-17 MED ORDER — DOCUSATE SODIUM 100 MG PO CAPS
100.0000 mg | ORAL_CAPSULE | Freq: Two times a day (BID) | ORAL | Status: DC
Start: 2021-11-17 — End: 2021-11-25
  Administered 2021-11-18 – 2021-11-25 (×15): 100 mg via ORAL
  Filled 2021-11-17 (×15): qty 1

## 2021-11-17 MED ORDER — EZETIMIBE 10 MG PO TABS
10.0000 mg | ORAL_TABLET | Freq: Every day | ORAL | Status: DC
  Administered 2021-11-18 – 2021-11-25 (×8): 10 mg via ORAL
  Filled 2021-11-17 (×8): qty 1

## 2021-11-17 MED ORDER — POLYETHYLENE GLYCOL 3350 17 G PO PACK
17.0000 g | PACK | Freq: Every day | ORAL | Status: DC
Start: 2021-11-18 — End: 2021-11-25
  Administered 2021-11-18 – 2021-11-24 (×6): 17 g via ORAL
  Filled 2021-11-17 (×8): qty 1

## 2021-11-17 MED ORDER — ESCITALOPRAM OXALATE 10 MG PO TABS
10.0000 mg | ORAL_TABLET | Freq: Every day | ORAL | Status: DC
  Administered 2021-11-18 – 2021-11-25 (×8): 10 mg via ORAL
  Filled 2021-11-17 (×8): qty 1

## 2021-11-17 NOTE — Progress Notes (Addendum)
STROKE TEAM PROGRESS NOTE  ? 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  Postprocedure CT was unremarkable.  Patient is still intubated but is weaning.  Blood pressure adequately controlled.  No family is currently at the bedside. Plan to extubate today, on a wean. WBC 19.5, CR 1.07.  Following commands and moving all extremities antigravity ? ?Vitals:  ? 11/17/21 1000 11/17/21 1100 11/17/21 1200 11/17/21 1300  ?BP: 140/63 97/65 (!) 94/40 (!) 116/44  ?Pulse: 96 82 68 78  ?Resp: 19 (!) 21 (!) 23 (!) 23  ?Temp:   98.1 ?F (36.7 ?C)   ?TempSrc:   Oral   ?SpO2: 92% 91% 91% 93%  ?Weight:      ? ?CBC:  ?Recent Labs  ?Lab 11/16/21 ?1624 11/16/21 ?2341 11/17/21 ?0205  ?WBC 14.6*  --  19.5*  ?NEUTROABS 11.2*  --  15.9*  ?HGB 13.3 11.6* 11.6*  ?HCT 40.5 34.0* 34.6*  ?MCV 87.5  --  88.9  ?PLT 267  --  233  ? ?Basic Metabolic Panel:  ?Recent Labs  ?Lab 11/16/21 ?1725 11/16/21 ?2341 11/17/21 ?0205  ?NA 136 136 136  ?K 4.0 3.8 4.1  ?CL 99  --  106  ?CO2 26  --  23  ?GLUCOSE 94  --  116*  ?BUN 26*  --  23  ?CREATININE 0.99  --  1.07*  ?CALCIUM 9.2  --  8.3*  ? ?Lipid Panel:  ?Recent Labs  ?Lab 11/17/21 ?0205  ?CHOL 99  ?TRIG 159*  ?HDL 47  ?CHOLHDL 2.1  ?VLDL 32  ?[place] 20  ? ?HgbA1c:  ?Recent Labs  ?Lab 11/17/21 ?0205  ?HGBA1C 6.5*  ? ?Urine Drug Screen: No results for input(s): LABOPIA, COCAINSCRNUR, LABBENZ, AMPHETMU, THCU, LABBARB in the last 168 hours.  ?Alcohol Level No results for input(s): ETH in the last 168 hours. ? ?IMAGING past 24 hours ?CT HEAD WO CONTRAST (5MM) ? ?Result Date: 11/17/2021 ?CLINICAL DATA:  Follow-up examination for acute stroke. EXAM: CT HEAD WITHOUT CONTRAST TECHNIQUE: Contiguous axial images were obtained from the base  of the skull through the vertex without intravenous contrast. RADIATION DOSE REDUCTION: This exam was performed according to the departmental dose-optimization program which includes automated exposure control, adjustment of the mA and/or kV according to patient size and/or use of iterative reconstruction technique. COMPARISON:  Prior CT and CTA from earlier the same day. FINDINGS: Brain: Age-related cerebral atrophy with chronic microvascular ischemic disease. Evolving left MCA territory infarct noted involving the left caudate. Diffuse contrast staining seen within the left temporal region, likely areas of evolving ischemia as well. No acute intracranial hemorrhage. No other new large vessel territory infarct. No mass lesion or midline shift. No hydrocephalus or extra-axial fluid collection. Vascular: Residual contrast material seen throughout the intracranial vasculature. Skull: Scalp soft tissues and calvarium demonstrate no new abnormality. Sinuses/Orbits: Globes orbital soft tissues demonstrate no acute finding. Scattered mucosal thickening with air fluid levels noted within the paranasal sinuses. Patient is intubated. Mastoid air cells remain clear. Other: None. IMPRESSION: 1. Evolving left MCA territory infarct involving the left caudate. Diffuse contrast staining within the left temporal region, likely reflecting areas of evolving ischemia as well. No acute intracranial hemorrhage. 2. No other new acute intracranial abnormality. Electronically Signed   By: Pincus Badder.D.  On: 11/17/2021 01:31  ? ?Portable Chest x-ray ? ?Result Date: 11/16/2021 ?CLINICAL DATA:  Check endotracheal tube placement EXAM: PORTABLE CHEST 1 VIEW COMPARISON:  None FINDINGS: Cardiac shadow is at the upper limits of normal in size. Endotracheal tube is seen approximately 5.4 cm above the carina. Gastric catheter extends into the stomach. Lungs are hypoinflated but clear. Calcified breast implants are noted bilaterally.  Prior right shoulder surgery are is seen. IMPRESSION: No acute abnormality noted. Tubes and lines in satisfactory position. Electronically Signed   By: Inez Catalina M.D.   On: 11/16/2021 23:36  ? ?CT HEAD CODE STROKE WO CONTRAST` ? ?Result Date: 11/16/2021 ?CLINICAL DATA:  Code stroke.  Aphasia.  Fall last night EXAM: CT HEAD WITHOUT CONTRAST TECHNIQUE: Contiguous axial images were obtained from the base of the skull through the vertex without intravenous contrast. RADIATION DOSE REDUCTION: This exam was performed according to the departmental dose-optimization program which includes automated exposure control, adjustment of the mA and/or kV according to patient size and/or use of iterative reconstruction technique. COMPARISON:  MRI head 09/18/2017 FINDINGS: Brain: Ill-defined hypodensity in the left lateral temporal lobe consistent with cytotoxic edema. Adjacent area of cytotoxic edema in the left temporoparietal lobe. No associated hemorrhage. Hypodensity also in the left basal ganglia involving the putamen and caudate. Insula appears spared Mild atrophy. Negative for hydrocephalus. Patchy hypodensity throughout the cerebral white matter compatible with chronic microvascular ischemia. Vascular: Hyperdensity left middle cerebral artery in the sylvian fissure compatible with acute thrombus. This extends into the M1 segment. Skull: Negative Sinuses/Orbits: Bilateral cataract extraction. Paranasal sinuses clear. Other: None ASPECTS ([place] Stroke Program Early CT Score) - Ganglionic level infarction (caudate, lentiform nuclei, internal capsule, insula, M1-M3 cortex): 3. - Supraganglionic infarction (M4-M6 cortex): 3 Total score (0-10 with 10 being normal): 6 IMPRESSION: 1. 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 No acute intracranial hemorrhage. Hyperdense left MCA compatible with acute thrombus. 2. ASPECTS is 6. 3. Atrophy and moderate chronic  microvascular ischemic change throughout the white matter. 4. Code stroke imaging results were communicated on 11/16/2021 at 5:29 pm to provider Quinn Axe via text page Electronically Signed   By: Franchot Gallo M.D.   On: 11/16/2021 17:31  ? ?CT ANGIO HEAD NECK W WO CM W PERF (CODE STROKE) ? ?Result Date: 11/16/2021 ?CLINICAL DATA:  Acute neuro deficit. Aphasia. Right-sided weakness. Stroke EXAM: CT ANGIOGRAPHY HEAD AND NECK CT PERFUSION BRAIN TECHNIQUE: Multidetector CT imaging of the head and neck was performed using the standard protocol during bolus administration of intravenous contrast. Multiplanar CT image reconstructions and MIPs were obtained to evaluate the vascular anatomy. Carotid stenosis measurements (when applicable) are obtained utilizing NASCET criteria, using the distal internal carotid diameter as the denominator. Multiphase CT imaging of the brain was performed following IV bolus contrast injection. Subsequent parametric perfusion maps were calculated using RAPID software. RADIATION DOSE REDUCTION: This exam was performed according to the departmental dose-optimization program which includes automated exposure control, adjustment of the mA and/or kV according to patient size and/or use of iterative reconstruction technique. CONTRAST:  38m OMNIPAQUE IOHEXOL 350 MG/ML SOLN COMPARISON:  CT head 11/16/2021 FINDINGS: CTA NECK FINDINGS Aortic arch: Atherosclerotic calcification aortic arch. Mild stenosis at the origin of the left common carotid artery. Moderate origin proximal left subclavian artery. Right carotid system: Mild atherosclerotic disease right common carotid artery. Extensive calcified plaque right carotid bifurcation and proximal right internal carotid artery. 50% diameter stenosis proximal right internal carotid  artery. Left carotid system: Mild stenosis origin of left common carotid artery. Calcified plaque left carotid bifurcation and proximal left internal carotid artery. The left  internal carotid artery above the calcification has decreased caliber due to flow limiting stenosis. Luminal diameter difficult to measure due to calcific plaque. Minimal luminal diameter in the proximal left internal ca

## 2021-11-17 NOTE — Progress Notes (Signed)
?  Transition of Care (TOC) Screening Note ? ? ?Patient Details  ?Name: Cassidy Hernandez ?Date of Birth: 1941/07/18 ? ? ?Transition of Care (TOC) CM/SW Contact:    ?Benard Halsted, LCSW ?Phone Number: ?11/17/2021, 4:16 PM ? ? ? ?Transition of Care Department [facility]) has reviewed patient and no TOC needs have been identified at this time. We will continue to monitor patient advancement through interdisciplinary progression rounds. If new patient transition needs arise, please place a TOC consult. ? ? ?

## 2021-11-17 NOTE — Progress Notes (Signed)
Reassessed at bedside for exubation. PSV decreased to 5/5. Patient with normal effort, pulling Vt 350-550.  Planned MRI at 4pm. Reviewed with Neuro. Proceed with extubation.  ? ? ? ?Noe Gens, MSN, APRN, NP-C, AGACNP-BC ?[facility] ?11/17/2021, 9:42 AM ? ? ?Please see [email or web address] for pager details.  ? ?From 7A-7P if no response, please call [phone] ?After hours, please call ELink [phone] ? ?

## 2021-11-17 NOTE — Progress Notes (Signed)
? ? ?Referring Physician(s): ?Dr. Donnetta Simpers ? ?Supervising Physician: Luanne Bras ? ?Patient Status:  [facility] - In-pt ? ?Chief Complaint: ?Code stroke ? ?Subjective: ?S/p L MCA thrombectomy with L ICA stent placement by Dr. Rosann Auerbach. ?Patient remains intubated but is weaning successfully thus far.  ?Following most commands, nods head to answer.  ? ? ?Allergies: ?Sulfa antibiotics ? ?Medications: ?Prior to Admission medications   ?Medication Sig Start Date End Date Taking? Authorizing Provider  ?acetaminophen (TYLENOL) 325 MG tablet Take 650 mg by mouth every 8 (eight) hours as needed for moderate pain or mild pain (Back).    [provider]  ?amLODipine (NORVASC) 5 MG tablet Take 5 mg by mouth daily.  12/04/14   [provider]  ?aspirin EC 325 MG tablet Take 1 tablet (325 mg total) by mouth daily. 06/11/21   Lattie Corns, PA-C  ?carvedilol (COREG) 3.125 MG tablet Take 3.125 mg by mouth 2 (two) times daily. 10/05/19   [provider]  ?chlorthalidone (HYGROTON) 25 MG tablet Take 12.5 mg by mouth daily. 09/23/14   [provider]  ?docusate sodium (COLACE) 100 MG capsule Take 1 capsule (100 mg total) by mouth 2 (two) times daily. 06/11/21   Lattie Corns, PA-C  ?escitalopram (LEXAPRO) 10 MG tablet Take 10 mg by mouth daily. 05/13/21   [provider]  ?ezetimibe (ZETIA) 10 MG tablet Take 10 mg by mouth daily. 06/23/18   [provider]  ?isosorbide mononitrate (IMDUR) 60 MG 24 hr tablet Take 60 mg by mouth daily.    [provider]  ?losartan (COZAAR) 100 MG tablet Take 100 mg by mouth daily.     [provider]  ?magnesium chloride (SLOW-MAG) 64 MG TBEC SR tablet Take 1 tablet by mouth daily.    [provider]  ?Multiple Vitamin (MULTIVITAMIN) capsule Take 1 capsule by mouth daily.    [provider]  ?nitroGLYCERIN (NITROSTAT) 0.4 MG SL tablet Place 0.4 mg under the tongue every 5 (five) minutes as needed  for chest pain. 04/13/18 09/24/28  [provider]  ?omeprazole (PRILOSEC) 20 MG capsule Take 20 mg by mouth daily.     [provider]  ?ondansetron (ZOFRAN) 4 MG tablet Take 1 tablet (4 mg total) by mouth every 6 (six) hours as needed for nausea. 06/11/21   Lattie Corns, PA-C  ?oxyCODONE (OXY IR/ROXICODONE) 5 MG immediate release tablet Take 1-2 tablets (5-10 mg total) by mouth every 4 (four) hours as needed for moderate pain (pain score 4-6). 06/11/21   Lattie Corns, PA-C  ?rosuvastatin (CRESTOR) 20 MG tablet Take 20 mg by mouth daily.  12/01/14   [provider]  ?traMADol (ULTRAM) 50 MG tablet Take 1 tablet (50 mg total) by mouth every 6 (six) hours as needed for moderate pain. 06/11/21   Lattie Corns, PA-C  ? ? ? ?Vital Signs: ?BP (!) 124/50   Pulse 81   Temp 99.7 ?F (37.6 ?C) (Axillary)   Resp 15   Wt 173 lb 1 oz (78.5 kg)   SpO2 95%   BMI 30.66 kg/m?  ? ?Physical Exam ?NAD, alert, intubated, weaning sedation ?Groin: soft, intact, minimal amount of stable ecchymosis, no oozing.  ?Pulses: DP faintly palpable bilaterally. ?Neuro: weaning sedation, responds to questions with nods, pupils equal, following most commands and moving all extremities. Further exam deferred pending wean from vent and sedation.  ? ?Imaging: ?CT HEAD WO CONTRAST (5MM) ? ?Result Date: 11/17/2021 ?CLINICAL DATA:  Follow-up  examination for acute stroke. EXAM: CT HEAD WITHOUT CONTRAST TECHNIQUE: Contiguous axial images were obtained from the base of the skull through the vertex without intravenous contrast. RADIATION DOSE REDUCTION: This exam was performed according to the departmental dose-optimization program which includes automated exposure control, adjustment of the mA and/or kV according to patient size and/or use of iterative reconstruction technique. COMPARISON:  Prior CT and CTA from earlier the same day. FINDINGS: Brain: Age-related cerebral atrophy with chronic microvascular ischemic  disease. Evolving left MCA territory infarct noted involving the left caudate. Diffuse contrast staining seen within the left temporal region, likely areas of evolving ischemia as well. No acute intracranial hemorrhage. No other new large vessel territory infarct. No mass lesion or midline shift. No hydrocephalus or extra-axial fluid collection. Vascular: Residual contrast material seen throughout the intracranial vasculature. Skull: Scalp soft tissues and calvarium demonstrate no new abnormality. Sinuses/Orbits: Globes orbital soft tissues demonstrate no acute finding. Scattered mucosal thickening with air fluid levels noted within the paranasal sinuses. Patient is intubated. Mastoid air cells remain clear. Other: None. IMPRESSION: 1. Evolving left MCA territory infarct involving the left caudate. Diffuse contrast staining within the left temporal region, likely reflecting areas of evolving ischemia as well. No acute intracranial hemorrhage. 2. No other new acute intracranial abnormality. Electronically Signed   By: Jeannine Boga M.D.   On: 11/17/2021 01:31  ? ?Portable Chest x-ray ? ?Result Date: 11/16/2021 ?CLINICAL DATA:  Check endotracheal tube placement EXAM: PORTABLE CHEST 1 VIEW COMPARISON:  None FINDINGS: Cardiac shadow is at the upper limits of normal in size. Endotracheal tube is seen approximately 5.4 cm above the carina. Gastric catheter extends into the stomach. Lungs are hypoinflated but clear. Calcified breast implants are noted bilaterally. Prior right shoulder surgery are is seen. IMPRESSION: No acute abnormality noted. Tubes and lines in satisfactory position. Electronically Signed   By: Inez Catalina M.D.   On: 11/16/2021 23:36  ? ?CT HEAD CODE STROKE WO CONTRAST` ? ?Result Date: 11/16/2021 ?CLINICAL DATA:  Code stroke.  Aphasia.  Fall last night EXAM: CT HEAD WITHOUT CONTRAST TECHNIQUE: Contiguous axial images were obtained from the base of the skull through the vertex without intravenous  contrast. RADIATION DOSE REDUCTION: This exam was performed according to the departmental dose-optimization program which includes automated exposure control, adjustment of the mA and/or kV according to patient size and/or use of iterative reconstruction technique. COMPARISON:  MRI head 09/18/2017 FINDINGS: Brain: Ill-defined hypodensity in the left lateral temporal lobe consistent with cytotoxic edema. Adjacent area of cytotoxic edema in the left temporoparietal lobe. No associated hemorrhage. Hypodensity also in the left basal ganglia involving the putamen and caudate. Insula appears spared Mild atrophy. Negative for hydrocephalus. Patchy hypodensity throughout the cerebral white matter compatible with chronic microvascular ischemia. Vascular: Hyperdensity left middle cerebral artery in the sylvian fissure compatible with acute thrombus. This extends into the M1 segment. Skull: Negative Sinuses/Orbits: Bilateral cataract extraction. Paranasal sinuses clear. Other: None ASPECTS ([place] Stroke Program Early CT Score) - Ganglionic level infarction (caudate, lentiform nuclei, internal capsule, insula, M1-M3 cortex): 3. - Supraganglionic infarction (M4-M6 cortex): 3 Total score (0-10 with 10 being normal): 6 IMPRESSION: 1. 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 No acute intracranial hemorrhage. Hyperdense left MCA compatible with acute thrombus. 2. ASPECTS is 6. 3. Atrophy and moderate chronic microvascular ischemic change throughout the white matter. 4. Code stroke imaging results were communicated on 11/16/2021 at 5:29 pm to provider [facility] via  text page Electronically Signed   By: Franchot Gallo M.D.   On: 11/16/2021 17:31  ? ?CT ANGIO HEAD NECK W WO CM W PERF (CODE STROKE) ? ?Result Date: 11/16/2021 ?CLINICAL DATA:  Acute neuro deficit. Aphasia. Right-sided weakness. Stroke EXAM: CT ANGIOGRAPHY HEAD AND NECK CT PERFUSION BRAIN TECHNIQUE: Multidetector  CT imaging of the head and neck was performed using the standard protocol during bolus administration of intravenous contrast. Multiplanar CT image reconstructions and MIPs were obtained to evaluate the vascul

## 2021-11-17 NOTE — Progress Notes (Signed)
SLP Cancellation Note ? ?Patient Details ?Name: Cassidy Hernandez ?MRN: [id number] ?DOB: 1941/07/10 ? ? ?Cancelled treatment:       Reason Eval/Treat Not Completed: Patient not medically ready (Pt currently on the vent. SLP will follow up.) ? ?Aniah Pauli I. Hardin Negus, [place], CCC-SLP ?Acute Rehabilitation Services ?Office number [phone] ?Pager [phone] ? ?Horton Marshall ?11/17/2021, 8:11 AM ?

## 2021-11-17 NOTE — Procedures (Signed)
Extubation Procedure Note ? ?Patient Details:   ?Name: Cassidy Hernandez ?DOB: 1940-09-03 ?MRN: [id number] ?  ?Airway Documentation:  ?  ?Vent end date: 11/17/21 Vent end time: 1000  ? ?Evaluation ? O2 sats: stable throughout ?Complications: No apparent complications ?Patient did tolerate procedure well. ?Bilateral Breath Sounds: Diminished ?  ?Yes ?Patient Ability to Speak ? ?Patient had a positive cuff leak prior to extubation. Suctioned via ETT/orally  prior to extubation. Upon extubation patient was placed on 3L [place], able to speak. Give a good cough, no stridor heard at this time. ? ?Sallye Ober ?11/17/2021, 10:04 AM ? ?

## 2021-11-17 NOTE — Progress Notes (Signed)
OT Cancellation Note ? ?Patient Details ?Name: Cassidy Hernandez ?MRN: [id number] ?DOB: Feb 10, 1941 ? ? ?Cancelled Treatment:    Reason Eval/Treat Not Completed:  (Pt intubated with plans for extubation soon per RN, OT evaluation to attempt post extubation today.) ? ?Quindarrius [place]lin A Banjamin Stovall ?11/17/2021, 7:37 AM ?

## 2021-11-17 NOTE — Progress Notes (Addendum)
? ?NAME:  Cassidy Hernandez, MRN:  [id number], DOB:  [birth year], LOS: 1 ?ADMISSION DATE:  11/16/2021, CONSULTATION DATE:  4/11 ?REFERRING MD:  Dr. Lorrin Goodell, CHIEF COMPLAINT:    ? ?History of Present Illness:  ?Patient is encephalopathic and/or intubated. Therefore history has been obtained from chart review.  ? ?81 year old female with past medical history as below, which is significant for coronary artery disease, peripheral vascular disease, hypertension, hyperlipidemia, and diabetes who presented to [facility] on 4/11 with concern for stroke.  Family spoke to her over the phone and noted that she was slurring her words at approximately 2 PM.  Last known well at 1030 the night before.  Upon presentation to the ED [facility] was 9. CT of the head with evolving L MCA stroke. CTA with defects in the distal L MCA and proximal M2. The patient was transferred to [facility] for thrombectomy. Had revascularization of left MCA M1 with TICI 3 as well as angioplasty + stent in the L ICA. Post procedure the patient was transferred to the ICU on the ventilator and PCCM was consulted.  ? ?Pertinent  Medical History  ? has a past medical history of Anginal pain ([place]), Aortic atherosclerosis, Cancer ([place]), Carotid artery stenosis (02/01/2010), Chronic bilateral low back pain with left-sided sciatica, CKD (chronic kidney disease), stage III ([place]), Coronary artery disease, Degenerative arthritis, Diastolic dysfunction, Dysarthria, GERD (gastroesophageal reflux disease), Hepatic steatosis, History of kidney stones, HLD (hyperlipidemia), Hypertension, Kidney stone, Lumbar radiculopathy, Major depression in remission [facility]), PVD (peripheral vascular disease) ([place]), Renal cyst, right (06/02/2015), Subclavian steal syndrome, and T2DM (type 2 diabetes mellitus) ([place]). ? ? ?Significant Hospital Events: ?Including procedures, antibiotic start and stop dates in addition to other pertinent events   ?4/11 Admit with L MCA CVA, to IR for angioplasty + stent to L ICA.  To ICU on vent ? ?Interim History / Subjective:  ?Afebrile / WBC 19.5  ?Vent - 40%, PEEP 5  ?Glucose range 94-116, Hgb A1c 6.5  ?I/O 872m UOP, +9086min last 24 hours ? ?Objective   ?Blood pressure 91/78, pulse 71, temperature 98.4 ?F (36.9 ?C), temperature source Axillary, resp. rate 18, weight 78.5 kg, SpO2 96 %. ?   ?Vent Mode: PRVC ?FiO2 (%):  [40 %-50 %] 40 % ?Set Rate:  [18 bmp] 18 bmp ?Vt Set:  [420 mL] 420 mL ?PEEP:  [5 cmH20] 5 cmH20 ?Plateau Pressure:  [18 cmJKD32-67mH20] 18 cmH20  ? ?Intake/Output Summary (Last 24 hours) at 11/17/2021 0758 ?Last data filed at 11/17/2021 0700 ?Gross per 24 hour  ?Intake 1760.82 ml  ?Output 860 ml  ?Net 900.82 ml  ? ?Filed Weights  ? 11/16/21 2200  ?Weight: 78.5 kg  ? ? ?Examination: ?General: elderly adult female lying in bed on vent in NAD ?HEENT: MM pink/moist, ETT, anicteric, pupils equal/reactive ?Neuro: mildly sedate, awakens to voice, follows commands x4 extremities ?CV: s1s2 RRR, SR 70's on monitor, no m/r/g ?PULM: non-labored at rest, lungs bilaterally clear  ?GI: soft, bsx4 hypoactive  ?Extremities: warm/dry, no edema  ?Skin: no rashes or lesions ? ?Resolved Hospital Problem list   ? ? ?Assessment & Plan:  ? ?L MCA CVA ?Did not receive tpa (window). S/p mechanical thrombectomy to left M1 with tici 3 and L ICA stenting. Post cangrelor infusion.  ?-post procedure and stroke care per Neurology  ?-await ECHO, MRI  ?-SBP goal 120-140  ?-continue cleviprex for above goal as needed  ?-PT/OT/SLP once extubated  ?-delirium prevention measures  ? ?Ventilator Support Post CVA  ?-  PRVC 8cc/kg as rest mode ventilation  ?-PSV wean as tolerated, MV barrier for extubation currently ?-intermittent CXR  ?-VAP prevention measures  ?-fentanyl infusion for RASS goal 0 to -1  ? ?DM II ?Hgb A1c 6.5 ?-SSI, moderate scale  ? ?HTN, HLD, CAD ?-hold home agents  ?-BP goals as above  ?-continue ASA, statin  ? ?At Risk Malnutrition  ?-if not extubated 4/12, initiate TF  ? ?Best Practice (right  click and "Reselect all SmartList Selections" daily)  ?Diet/type: NPO ?DVT prophylaxis: LMWH ?GI prophylaxis: PPI ?Lines: N/A ?Foley:  Yes, and it is still needed ?Code Status:  full code ?Last date of multidisciplinary goals of care discussion: pending, full code ? ?Critical Care Time: 34 minutes ?  ? ?Noe Gens, MSN, APRN, NP-C, AGACNP-BC ?[facility] ?11/17/2021, 7:58 AM ? ? ?Please see [email or web address] for pager details.  ? ?From 7A-7P if no response, please call [phone] ?After hours, please call [place] [phone] ? ? ? ? ? ? ? ? ?

## 2021-11-17 NOTE — Progress Notes (Signed)
Notified Dr. Rory Percy of SBP < 120 at 0245.  Cleviprex has been off since 0145 and Fentanyl has been titrated down.  SBP still remains below 120.  Current BP 115/45 (67).  MD said that current BP is okay as long as MAP remains above 65.  Contact CCM if MAP < 65.  Will continue to monitor.   ?

## 2021-11-17 NOTE — Progress Notes (Signed)
Patient transported from 4N27 to CT and back with no complications noted. ?

## 2021-11-17 NOTE — Evaluation (Signed)
Physical Therapy Evaluation ?Patient Details ?Name: Cassidy Hernandez ?MRN: [id number] ?DOB: 12-10-1940 ?Today's Date: 11/17/2021 ? ?History of Present Illness ? 81 y.o. female presents to [facility] hospital on 11/16/2021 with global aphasia. CT  head demonstrates L MCA CVA. Pt underwent revascularization on 11/16/2021. PMH includes PVD, CAD, HTN, HLD, DM2.  ?Clinical Impression ? Pt presents to PT with deficits in functional mobility, gait, balance, power, endurance, strength, communication. Pt with significant expressive aphasia, limiting history. Pt follows commands well during session and demonstrates a strong posterior lean, limiting her ability to stand and ambulate. Pt will benefit from aggressive mobilization in an effort to reduce falls risk and to restore her prior level of function. PT recommends AIR admission.   ?   ? ?Recommendations for follow up therapy are one component of a multi-disciplinary discharge planning process, led by the attending physician.  Recommendations may be updated based on patient status, additional functional criteria and insurance authorization. ? ?Follow Up Recommendations Acute inpatient rehab (3hours/day) ? ?  ?Assistance Recommended at Discharge    ?Patient can return home with the following ? Two people to help with walking and/or transfers;A lot of help with bathing/dressing/bathroom;Assistance with cooking/housework;Direct supervision/assist for medications management;Direct supervision/assist for financial management;Assist for transportation;Help with stairs or ramp for entrance ? ?  ?Equipment Recommendations Wheelchair (measurements PT);Wheelchair cushion (measurements PT);BSC/3in1;Hospital bed  ?Recommendations for Other Services ? Rehab consult  ?  ?Functional Status Assessment Patient has had a recent decline in their functional status and demonstrates the ability to make significant improvements in function in a reasonable and predictable amount of time.  ? ?  ?Precautions /  Restrictions Precautions ?Precautions: Fall ?Restrictions ?Weight Bearing Restrictions: No  ? ?  ? ?Mobility ? Bed Mobility ?Overal bed mobility: Needs Assistance ?Bed Mobility: Sit to Supine, Supine to Sit ?  ?  ?Supine to sit: Mod assist, HOB elevated ?Sit to supine: Mod assist ?  ?  ?  ? ?Transfers ?Overall transfer level: Needs assistance ?Equipment used: 1 person hand held assist ?Transfers: Sit to/from Stand ?Sit to Stand: Mod assist, From elevated surface ?  ?  ?  ?  ?  ?General transfer comment: posterior lean ?  ? ?Ambulation/Gait ?Ambulation/Gait assistance: Max assist ?Gait Distance (Feet): 2 Feet ?Assistive device: 1 person hand held assist ?Gait Pattern/deviations: Step-to pattern ?Gait velocity: reduced ?Gait velocity interpretation: <1.31 ft/sec, indicative of household ambulator ?  ?General Gait Details: pt with reduced weight shift, requiring PT assist to shift weight in order to clear LEs to step. posterior lean ? ?Stairs ?  ?  ?  ?  ?  ? ?Wheelchair Mobility ?  ? ?Modified Rankin (Stroke Patients Only) ?Modified Rankin (Stroke Patients Only) ?Pre-Morbid Rankin Score:  (unsure of prior level due to expressive aphasia) ?Modified Rankin: Moderately severe disability ? ?  ? ?Balance Overall balance assessment: Needs assistance ?Sitting-balance support: Single extremity supported, Bilateral upper extremity supported, Feet unsupported ?Sitting balance-Leahy Scale: Poor ?Sitting balance - Comments: minA of hand hold ?  ?Standing balance support: Bilateral upper extremity supported ?Standing balance-Leahy Scale: Poor ?Standing balance comment: modA, posterior lean ?  ?  ?  ?  ?  ?  ?  ?  ?  ?  ?  ?   ? ? ? ?Pertinent Vitals/Pain Pain Assessment ?Pain Assessment: No/denies pain  ? ? ?Home Living Family/patient expects to be discharged to:: Private residence ?Living Arrangements: Children (daughter) ?Available Help at Discharge: Available 24 hours/day ?Type of Home: House ?Home Access: Level  entry ?   ?Entrance Stairs-Number of Steps: 1 ?  ?Home Layout: One level ?Home Equipment: Grab bars - tub/shower;Rolling Walker (2 wheels) ?Additional Comments: pt unable to provide a complete history due to expressive aphasia  ?  ?Prior Function Prior Level of Function : Independent/Modified Independent ?  ?  ?  ?  ?  ?  ?Mobility Comments: pt seems to communicate independence prior to admission, difficult to assess whether she utilizes an assistive device ?ADLs Comments: indep? drives ?  ? ? ?Hand Dominance  ?   ? ?  ?Extremity/Trunk Assessment  ? Upper Extremity Assessment ?Upper Extremity Assessment: Defer to OT evaluation ?RUE Deficits / Details: some grimacing with R shoulder ROM, pain? overall ROM is WFL. generally weak. using functioanlly to assist in ADLs and mobility ?LUE Deficits / Details: ROM seems WFL. generally weak. poor coordintion but also difficulty following commands to assess. impaired sensation? ?LUE Sensation: decreased light touch ?LUE Coordination: decreased fine motor ?  ? ?Lower Extremity Assessment ?Lower Extremity Assessment: Generalized weakness (grossly 4-/5 BLE) ?  ? ?Cervical / Trunk Assessment ?Cervical / Trunk Assessment: Normal  ?Communication  ? Communication: Expressive difficulties  ?Cognition Arousal/Alertness: Awake/alert ?Behavior During Therapy: Flat affect ?Overall Cognitive Status: Difficult to assess ?  ?  ?  ?  ?  ?  ?  ?  ?  ?  ?  ?  ?  ?  ?  ?  ?General Comments: pt follows commands well, impaired expressive communication ?  ?  ? ?  ?General Comments General comments (skin integrity, edema, etc.): VSS on RA, soft BP 90-100s during session ? ?  ?Exercises    ? ?Assessment/Plan  ?  ?PT Assessment Patient needs continued PT services  ?PT Problem List Decreased strength;Decreased activity tolerance;Decreased mobility;Decreased balance;Decreased cognition;Decreased knowledge of use of DME;Decreased knowledge of precautions;Decreased safety awareness ? ?   ?  ?PT Treatment  Interventions DME instruction;Gait training;Functional mobility training;Therapeutic activities;Therapeutic exercise;Balance training;Neuromuscular re-education;Cognitive remediation;Patient/family education;Wheelchair mobility training;Stair training   ? ?PT Goals (Current goals can be found in the Care Plan section)  ?Acute Rehab PT Goals ?Patient Stated Goal: pt with difficulty communicating a goal. PT goal to return to prior level of mobility ?PT Goal Formulation: Patient unable to participate in goal setting ?Time For Goal Achievement: 12/01/21 ?Potential to Achieve Goals: Fair ? ?  ?Frequency Min 4X/week ?  ? ? ?Co-evaluation   ?  ?  ?  ?  ? ? ?  ?AM-PAC PT "6 Clicks" Mobility  ?Outcome Measure Help needed turning from your back to your side while in a flat bed without using bedrails?: A Lot ?Help needed moving from lying on your back to sitting on the side of a flat bed without using bedrails?: A Lot ?Help needed moving to and from a bed to a chair (including a wheelchair)?: A Lot ?Help needed standing up from a chair using your arms (e.g., wheelchair or bedside chair)?: A Lot ?Help needed to walk in hospital room?: Total ?Help needed climbing 3-5 steps with a railing? : Total ?6 Click Score: 10 ? ?  ?End of Session Equipment Utilized During Treatment: Oxygen ?Activity Tolerance: Patient tolerated treatment well ?Patient left: in bed;with call bell/phone within reach;with bed alarm set ?Nurse Communication: Mobility status ?PT Visit Diagnosis: Other abnormalities of gait and mobility (R26.89);Muscle weakness (generalized) (M62.81);Other symptoms and signs involving the nervous system (R29.898) ?  ? ?Time: 1638-4665 ?PT Time Calculation (min) (ACUTE ONLY): 18 min ? ? ?Charges:  PT Evaluation ?$PT Eval Moderate Complexity: 1 Mod ?  ?  ?   ? ? ?Zenaida Niece, PT, DPT ?Acute Rehabilitation ?Pager: [phone] ?Office [phone] ? ? ?Zenaida Niece ?11/17/2021, 2:37 PM ? ?

## 2021-11-17 NOTE — Progress Notes (Signed)
Inpatient Rehab Admissions Coordinator:  ? ?Per therapy recommendations,  patient was screened for CIR candidacy by Lasaundra Riche, MS, CCC-SLP. At this time, Pt. Appears to be a a potential candidate for CIR. I will place   order for rehab consult per protocol for full assessment. Please contact me any with questions. ? ?Bryauna Byrum, MS, CCC-SLP ?Rehab Admissions Coordinator  ?[phone] (celll) ?[phone] (office) ? ?

## 2021-11-17 NOTE — Evaluation (Signed)
Occupational Therapy Evaluation ?Patient Details ?Name: Cassidy Hernandez ?MRN: [id number] ?DOB: 1941/03/19 ?Today's Date: 11/17/2021 ? ? ?History of Present Illness 81 y.o. female presents to [facility] hospital on 11/16/2021 with global aphasia. CT  head demonstrates L MCA CVA. Pt underwent revascularization on 11/16/2021. PMH includes PVD, CAD, HTN, HLD, DM2.  ? ?Clinical Impression ?  ?Cassidy Hernandez was evaluated s/p the above CVA, she not presents with expressive communication deficits therefore PLOF and home set up should he confirmed with family. Per chart and pt, she ambulated with a RW at baseline, lives in a 1 level home alone and had family near by. Upon evaluation pt answered most yes/no questions appropriate, and made some successful verbalization attempts and was following most one step commands well. Overall she was mod A for bed mobility and sit<>stand with RW. Pt has mild R lateral lean in sitting, and significant R later lean in standing and was unable to take steps this date. Due to impaired cognation, coordination, and balance she requires max A for LB ADLs and min A for UB ADLs. She will benefit from OT acutely. Recommend d/c to AIR for multidisciplinary approach to her mod I baseline.  ?   ? ?Recommendations for follow up therapy are one component of a multi-disciplinary discharge planning process, led by the attending physician.  Recommendations may be updated based on patient status, additional functional criteria and insurance authorization.  ? ?Follow Up Recommendations ? Acute inpatient rehab (3hours/day)  ?  ?Assistance Recommended at Discharge Frequent or constant Supervision/Assistance  ?Patient can return home with the following A lot of help with walking and/or transfers;A lot of help with bathing/dressing/bathroom;Assistance with cooking/housework;Direct supervision/assist for medications management;Direct supervision/assist for financial management;Help with stairs or ramp for entrance;Assist for  transportation ? ?  ?Functional Status Assessment ? Patient has had a recent decline in their functional status and demonstrates the ability to make significant improvements in function in a reasonable and predictable amount of time.  ?Equipment Recommendations ? Other (comment);BSC/3in1;Tub/shower seat (further assess DME needs with family)  ?  ?Recommendations for Other Services Rehab consult ? ? ?  ?Precautions / Restrictions Precautions ?Precautions: Fall ?Restrictions ?Weight Bearing Restrictions: No  ? ?  ? ?Mobility Bed Mobility ?Overal bed mobility: Needs Assistance ?Bed Mobility: Rolling, Sidelying to Sit, Sit to Supine ?Rolling: Min assist (for cues) ?Sidelying to sit: Mod assist ?  ?Sit to supine: Mod assist ?  ?  ?  ? ?Transfers ?Overall transfer level: Needs assistance ?Equipment used: Rolling walker (2 wheels) ?Transfers: Sit to/from Stand ?Sit to Stand: Mod assist, From elevated surface ?  ?  ?  ?  ?  ?General transfer comment: mod A to power into standing and manage RW. R lateral lean in standing, unable to progress to stepping this date ?  ? ?  ?Balance Overall balance assessment: Needs assistance ?Sitting-balance support: Feet supported ?Sitting balance-Leahy Scale: Poor ?Sitting balance - Comments: progressing to fair, R lateral lean and a few LOB that required assist to correct ?  ?Standing balance support: Bilateral upper extremity supported, During functional activity ?Standing balance-Leahy Scale: Poor ?Standing balance comment: R latereal lean in standing ?  ?  ?  ?  ?  ?  ?  ?  ?  ?  ?  ?   ? ?ADL either performed or assessed with clinical judgement  ? ?ADL Overall ADL's : Needs assistance/impaired ?Eating/Feeding: NPO ?  ?Grooming: Minimal assistance;Sitting ?  ?Upper Body Bathing: Minimal assistance;Sitting ?  ?Lower Body  Bathing: Maximal assistance;Sit to/from stand ?  ?Upper Body Dressing : Minimal assistance;Sitting ?  ?Lower Body Dressing: Maximal assistance;Sit to/from stand ?   ?Toilet Transfer: Maximal assistance;Stand-pivot;BSC/3in1 ?  ?Toileting- Clothing Manipulation and Hygiene: Maximal assistance;Bed level ?Toileting - Clothing Manipulation Details (indicate cue type and reason): incontinent BM this date - hygiene at bed level ?  ?  ?Functional mobility during ADLs: Moderate assistance (for sit<>Stand only) ?General ADL Comments: limited by impaired cog, expressive aphasia, R lateral lean and posterior bias in standing, impaired coordination and general fatigue  ? ? ? ?Vision Baseline Vision/History: 0 No visual deficits ?Vision Assessment?: Vision impaired- to be further tested in functional context ?Additional Comments: would benefit from vision assessment - pt not following multi step commands this date  ?   ?Perception   ?  ?Praxis   ?  ? ?Pertinent Vitals/Pain Pain Assessment ?Pain Assessment: Faces ?Faces Pain Scale: Hurts a little bit ?Pain Location: generalized & throat ?Pain Descriptors / Indicators: Grimacing, Discomfort ?Pain Intervention(s): Monitored during session  ? ? ? ?Hand Dominance   ?  ?Extremity/Trunk Assessment Upper Extremity Assessment ?Upper Extremity Assessment: LUE deficits/detail;RUE deficits/detail ?RUE Deficits / Details: some grimacing with R shoulder ROM, pain? overall ROM is WFL. generally weak. using functioanlly to assist in ADLs and mobility ?LUE Deficits / Details: ROM seems WFL. generally weak. poor coordintion but also difficulty following commands to assess. impaired sensation? ?LUE Sensation: decreased light touch ?LUE Coordination: decreased fine motor ?  ?Lower Extremity Assessment ?Lower Extremity Assessment: Defer to PT evaluation ?  ?Cervical / Trunk Assessment ?Cervical / Trunk Assessment: Normal ?  ?Communication Communication ?Communication: Expressive difficulties ?  ?Cognition Arousal/Alertness: Awake/alert ?Behavior During Therapy: Flat affect ?Overall Cognitive Status: Difficult to assess ?  ?  ?  ?  ?  ?  ?  ?  ?  ?  ?  ?  ?  ?   ?  ?  ?General Comments: Pt following most simple one step commands. answered most yes/no questions appropriately. ?  ?  ?General Comments  VSS with nasal canula ? ?  ?Exercises   ?  ?Shoulder Instructions    ? ? ?Home Living Family/patient expects to be discharged to:: Private residence ?Living Arrangements: Alone ?Available Help at Discharge: Available 24 hours/day ?Type of Home: House ?Home Access: Stairs to enter ?Entrance Stairs-Number of Steps: 1 ?  ?Home Layout: One level ?  ?  ?Bathroom Shower/Tub: Tub/shower unit ?  ?Bathroom Toilet: Handicapped height ?  ?  ?Home Equipment: Grab bars - tub/shower;Rolling Walker (2 wheels) ?  ?Additional Comments: home set needs to be confirmed with family - pt with expressive communication difficulties ?  ? ?  ?Prior Functioning/Environment Prior Level of Function : Independent/Modified Independent ?  ?  ?  ?  ?  ?  ?Mobility Comments: ambulates with RW/SPC? ?ADLs Comments: indep? drives ?  ? ?  ?  ?OT Problem List: Decreased strength;Decreased range of motion;Decreased activity tolerance;Impaired balance (sitting and/or standing);Decreased coordination;Decreased cognition;Decreased safety awareness;Decreased knowledge of use of DME or AE;Impaired sensation ?  ?   ?OT Treatment/Interventions: Self-care/ADL training;Therapeutic exercise;Therapeutic activities;Balance training  ?  ?OT Goals(Current goals can be found in the care plan section) Acute Rehab OT Goals ?Patient Stated Goal: unable to state ?OT Goal Formulation: Patient unable to participate in goal setting ?Time For Goal Achievement: 12/01/21 ?Potential to Achieve Goals: Good ?ADL Goals ?Pt Will Perform Grooming: with modified independence;standing ?Pt Will Perform Lower Body Dressing: with min guard assist;sit to/from  stand ?Pt Will Transfer to Toilet: with supervision;ambulating ?Additional ADL Goal #1: Pt will demonstrate increased activity tolerance to complete at least 3 ADLs in standing with supervision  A ?Additional ADL Goal #2: Pt will independently follow 3 step directional task as a precursor to IADLs  ?OT Frequency: Min 2X/week ?  ? ?Co-evaluation   ?  ?  ?  ?  ? ?  ?AM-PAC OT "6 Clicks" Daily Activity     ?Outcome M

## 2021-11-18 DIAGNOSIS — I63512 Cerebral infarction due to unspecified occlusion or stenosis of left middle cerebral artery: Secondary | ICD-10-CM | POA: Diagnosis not present

## 2021-11-18 HISTORY — PX: IR INTRAVSC STENT CERV CAROTID W/O EMB-PROT MOD SED INC ANGIO: IMG2304

## 2021-11-18 LAB — GLUCOSE, CAPILLARY
Glucose-Capillary: 93 mg/dL (ref 70–99)
Glucose-Capillary: 106 mg/dL — ABNORMAL HIGH (ref 70–99)
Glucose-Capillary: 129 mg/dL — ABNORMAL HIGH (ref 70–99)
Glucose-Capillary: 110 mg/dL — ABNORMAL HIGH (ref 70–99)
Glucose-Capillary: 104 mg/dL — ABNORMAL HIGH (ref 70–99)
Glucose-Capillary: 104 mg/dL — ABNORMAL HIGH (ref 70–99)

## 2021-11-18 LAB — BASIC METABOLIC PANEL
Anion gap: 7 (ref 5–15)
BUN: 25 mg/dL — ABNORMAL HIGH (ref 8–23)
CO2: 21 mmol/L — ABNORMAL LOW (ref 22–32)
Calcium: 7.7 mg/dL — ABNORMAL LOW (ref 8.9–10.3)
Chloride: 109 mmol/L (ref 98–111)
Creatinine, Ser: 1.13 mg/dL — ABNORMAL HIGH (ref 0.44–1.00)
GFR, Estimated: 49 mL/min — ABNORMAL LOW (ref >60)
Glucose, Bld: 97 mg/dL (ref 70–99)
Potassium: 3.7 mmol/L (ref 3.5–5.1)
Sodium: 137 mmol/L (ref 135–145)

## 2021-11-18 LAB — CBC
HCT: 25.2 % — ABNORMAL LOW (ref 36.0–46.0)
Hemoglobin: 8.4 g/dL — ABNORMAL LOW (ref 12.0–15.0)
MCH: 29.9 pg (ref 26.0–34.0)
MCHC: 33.3 g/dL (ref 30.0–36.0)
MCV: 89.7 fL (ref 80.0–100.0)
Platelets: 192 10*3/uL (ref 150–400)
RBC: 2.81 MIL/uL — ABNORMAL LOW (ref 3.87–5.11)
RDW: 14.6 % (ref 11.5–15.5)
WBC: 11.1 10*3/uL — ABNORMAL HIGH (ref 4.0–10.5)
nRBC: 0 % (ref 0.0–0.2)

## 2021-11-18 MED ORDER — PANTOPRAZOLE SODIUM 40 MG PO TBEC
40.0000 mg | DELAYED_RELEASE_TABLET | Freq: Every day | ORAL | Status: DC
Start: 2021-11-18 — End: 2021-11-25
  Administered 2021-11-18 – 2021-11-24 (×7): 40 mg via ORAL
  Filled 2021-11-18 (×7): qty 1

## 2021-11-18 NOTE — Evaluation (Signed)
Speech Language Pathology Evaluation ?Patient Details ?Name: Cassidy Hernandez ?MRN: [id number] ?DOB: 1941-04-21 ?Today's Date: 11/18/2021 ?Time: 5625-6389 ?SLP Time Calculation (min) (ACUTE ONLY): 19 min ? ?Problem List:  ?Patient Active Problem List  ? Diagnosis Date Noted  ? Acute ischemic left MCA stroke ([place]) 11/16/2021  ? 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 ?Past Medical History:  ?Past Medical History:  ?Diagnosis Date  ? Anginal pain ([place])   ? Aortic atherosclerosis   ? Cancer [facility])   ? 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 a.) Doppler 37/34/2876: 81% LICA and 15% RICA. b.) Doppler 72/62/0355: >97% LICA and 41% RICA. c.) Doppler 10/25/16: >70% Bilater ICAs  ? Chronic bilateral low back pain with left-sided sciatica   ? CKD (chronic kidney disease), stage III ([place])   ? Coronary artery disease   ? a.) LHC 12/24/2014 -> small LAD system; diffuse CTO of LAD. 60% RCA. LM and LCx with no sig disease. no intervention; med mgmt.  ? Degenerative arthritis   ? Diastolic dysfunction   ? a.) TTE 09/20/2018: EF 55%, G1DD, mild LAE and RVE, triv-mild panvalvular regurgitation.  ? Dysarthria   ? GERD (gastroesophageal reflux disease)   ? Hepatic steatosis   ? History of kidney stones   ? HLD (hyperlipidemia)   ? Hypertension   ? Kidney stone   ? Lumbar radiculopathy   ? Major depression in remission [facility])   ? PVD (peripheral vascular disease) ([place])   ? Renal cyst, right 06/02/2015  ? a.) CT 06/02/2015 --> 4.4 cm RIGHT renal mass; MRI recommended. b.) MRI 06/16/2015 --> 3.8 x 4.2 x 4.0 cm proteinaceous/hemmorhagic cyst.  ? Subclavian steal syndrome   ? a.) LHC 12/24/2014 --> tight eccentric 90% ostial stenosis with damping.  ? T2DM (type 2 diabetes mellitus) ([place])   ? ?Past Surgical History:  ?Past Surgical History:  ?Procedure Laterality Date  ? ABDOMINAL HYSTERECTOMY    ? AUGMENTATION MAMMAPLASTY Bilateral 1980  ? BACK SURGERY  09/2018  ? BICEPT  TENODESIS  06/10/2021  ? Procedure: BICEPS TENODESIS;  Surgeon: Corky Mull, MD;  Location: [facility] ORS;  Service: Orthopedics;;  ? CARDIAC CATHETERIZATION Left 12/24/2014  ? Procedure: LEFT HEARD CATHETERIZATION; Location: [facility]; Surgeon: Laurence Aly, MD  ? CHOLECYSTECTOMY    ? COLONOSCOPY N/A 02/11/2016  ? Procedure: COLONOSCOPY;  Surgeon: Manya Silvas, MD;  Location: [facility] ENDOSCOPY;  Service: Endoscopy;  Laterality: N/A;  ? RADIOLOGY WITH ANESTHESIA N/A 11/16/2021  ? Procedure: IR WITH ANESTHESIA- CODE STROKE;  Surgeon: Radiologist, Medication, MD;  Location: [facility];  Service: Radiology;  Laterality: N/A;  ? REVERSE SHOULDER ARTHROPLASTY Right 06/10/2021  ? Procedure: REVERSE SHOULDER ARTHROPLASTY;  Surgeon: Corky Mull, MD;  Location: [facility] ORS;  Service: Orthopedics;  Laterality: Right;  ? ?HPI:  ?Pt is an 81 y.o. female who presented to the ED on 11/16/2021 with aphasia and right hemiparesis. Pt found to have distal left M1 proximal M2 high-grade near occlusive stenosis and underwent emergent mechanical thrombectomy 4/11 requiring left MCA rescue angioplasty as well as proximal left ICA angioplasty and vascular stenting for high-grade stenosis. MRI brain 4/12: Acute left MCA infarct involving the left caudate, left putamen, left temporal lobe. Mild petechial hemorrhage associated with the infarct. PMH: PVD, CAD, HTN, HLD, DM2.  ? ?Assessment / Plan / Recommendation ?Clinical Impression ? Pt was seen for speech-language evaluation. She denied any deficits in communication at baseline. She presented with expressive aphasia  characterized by impairments in both expressive and receptive language, but with more notably difficulty with expressive language. Receptively, pt was able to follow some 1-step commands and answer yes/no questions within functional contexts. She exhibited difficulty with auditory comprehension beyond this level and with comprehension of more abstract decontextualized questions. Confrontational  naming of common objects was her greatest strength, but responsive naming and sentence completion proved more difficult. She was able to participate in simple conversation regarding her daughter and grandchildren without significant support from the SLP. Word retrieval difficulty was intermittently noted and phonemic and semantic paraphasias observed. She was able to verbally read words and sentences with occasional paraphasic errors. Mild to moderate dysarthria was noted characterized by reduced articulatory precision and reduced vocal intensity which together impacted speech intelligibility. Skilled SLP services are clinically indicated at this time. ?   ?SLP Assessment ? SLP Recommendation/Assessment: Patient needs continued [facility] Services ?SLP Visit Diagnosis: Aphasia (R47.01);Dysarthria and anarthria (R47.1)  ?  ?Recommendations for follow up therapy are one component of a multi-disciplinary discharge planning process, led by the attending physician.  Recommendations may be updated based on patient status, additional functional criteria and insurance authorization. ?   ?Follow Up Recommendations ? Acute inpatient rehab (3hours/day)  ?  ?Assistance Recommended at Discharge ? Frequent or constant Supervision/Assistance  ?Functional Status Assessment Patient has had a recent decline in their functional status and demonstrates the ability to make significant improvements in function in a reasonable and predictable amount of time.  ?Frequency and Duration min 2x/week  ?2 weeks ?  ?   ?SLP Evaluation ?Cognition ? Overall Cognitive Status: Difficult to assess (due to aphasia) ?Orientation Level: Other (comment) (expressive aphasia)  ?  ?   ?Comprehension ? Auditory Comprehension ?Overall Auditory Comprehension: Impaired ?Yes/No Questions: Impaired ?Basic Biographical Questions:  (2/5) ?Complex Questions:  (0/4) ?Commands: Impaired ?One Step Basic Commands:  (3/4) ?Two Step Basic Commands:   (0/4) ?Conversation: Simple  ?  ?Expression Expression ?Primary Mode of Expression: Verbal ?Verbal Expression ?Overall Verbal Expression: Impaired ?Initiation: Impaired ?Automatic Speech: Counting;Day of week;Month of year (None produced despite cues.) ?Level of Generative/Spontaneous Verbalization: Phrase;Sentence ?Repetition: Impaired ?Level of Impairment: Word level (0/4) ?Naming: Impairment ?Responsive:  (0/3) ?Confrontation: Impaired (4/6) ?Convergent:  (0/4) ?Divergent: Not tested ?Verbal Errors: Phonemic paraphasias   ?Oral / Motor ? Oral Motor/Sensory Function ?Overall Oral Motor/Sensory Function: Mild impairment ?Facial ROM: Reduced right;Suspected CN VII (facial) dysfunction ?Lingual ROM: Reduced right;Reduced left ?Lingual Strength: Reduced ?Motor Speech ?Overall Motor Speech: Impaired ?Respiration: Within functional limits ?Phonation: Low vocal intensity ?Resonance: Within functional limits ?Articulation: Impaired ?Level of Impairment: Sentence ?Intelligibility: Intelligibility reduced ?Word: 75-100% accurate ?Phrase: 75-100% accurate ?Sentence: 75-100% accurate ?Conversation: 50-74% accurate ?Motor Planning: Witnin functional limits ?Motor Speech Errors: Aware;Consistent ?Effective Techniques: Slow rate   ?        ?Johnasia Liese I. Hardin Negus, [place], CCC-SLP ?Acute Rehabilitation Services ?Office number [phone] ?Pager [phone] ? ?Horton Marshall ?11/18/2021, 10:10 AM ? ? ? ?

## 2021-11-18 NOTE — Progress Notes (Signed)
Inpatient Rehabilitation Admissions Coordinator  ? ?Noted daughter requesting [facility]. I will follow at a distance in case patient not accepted at [facility]. ? ?Danne Baxter, RN, MSN ?Rehab Admissions Coordinator ?[phone] ?11/18/2021 9:53 AM ? ?

## 2021-11-18 NOTE — Progress Notes (Addendum)
STROKE TEAM PROGRESS NOTE  ? ?INTERVAL HISTORY ?Patient is seen in her room  with no family at the bedside.  She was extubated yesterday and has done well.  She has been hemodynamically stable and has had no acute events overnight.  She remains aphasic but can speak in single words and phrases.  Will move her out of the ICU today.  Vital signs stable.  Neurological exam unchanged ? ?Vitals:  ? 11/18/21 1000 11/18/21 1100 11/18/21 1200 11/18/21 1300  ?BP: (!) 133/59 (!) 114/45    ?Pulse: 88 79 82 92  ?Resp: 16 (!) 22 (!) 21 18  ?Temp:   97.6 ?F (36.4 ?C)   ?TempSrc:   Oral   ?SpO2: 94% 97% 96% 95%  ?Weight:      ? ?CBC:  ?Recent Labs  ?Lab 11/16/21 ?1624 11/16/21 ?2341 11/17/21 ?0205 11/18/21 ?5329  ?WBC 14.6*  --  19.5* 11.1*  ?NEUTROABS 11.2*  --  15.9*  --   ?HGB 13.3   < > 11.6* 8.4*  ?HCT 40.5   < > 34.6* 25.2*  ?MCV 87.5  --  88.9 89.7  ?PLT 267  --  233 192  ? < > = values in this interval not displayed.  ? ? ?Basic Metabolic Panel:  ?Recent Labs  ?Lab 11/17/21 ?0205 11/18/21 ?9242  ?NA 136 137  ?K 4.1 3.7  ?CL 106 109  ?CO2 23 21*  ?GLUCOSE 116* 97  ?BUN 23 25*  ?CREATININE 1.07* 1.13*  ?CALCIUM 8.3* 7.7*  ? ? ?Lipid Panel:  ?Recent Labs  ?Lab 11/17/21 ?0205  ?CHOL 99  ?TRIG 159*  ?HDL 47  ?CHOLHDL 2.1  ?VLDL 32  ?[place] 20  ? ? ?HgbA1c:  ?Recent Labs  ?Lab 11/17/21 ?0205  ?HGBA1C 6.5*  ? ? ?Urine Drug Screen: No results for input(s): LABOPIA, COCAINSCRNUR, LABBENZ, AMPHETMU, THCU, LABBARB in the last 168 hours.  ?Alcohol Level No results for input(s): ETH in the last 168 hours. ? ?IMAGING past 24 hours ?MR BRAIN WO CONTRAST ? ?Addendum Date: 11/17/2021   ?ADDENDUM REPORT: 11/17/2021 18:10 ADDENDUM: These results will be called to the ordering clinician or representative by the Radiologist Assistant, and communication documented in the PACS or [employer]. Electronically Signed   By: Franchot Gallo M.D.   On: 11/17/2021 18:10  ? ?Result Date: 11/17/2021 ?CLINICAL DATA:  Stroke post thrombectomy left MCA.  EXAM: MRI HEAD WITHOUT CONTRAST TECHNIQUE: Multiplanar, multiecho pulse sequences of the brain and surrounding structures were obtained without intravenous contrast. COMPARISON:  CT head 11/17/2021 FINDINGS: Brain: Acute infarct in the left caudate and left putamen. This is similar to the original CT of 11/16/2021. Additional areas of infarct in the left lateral and posterior temporal lobe also similar to that seen on the prior CT. These areas show restricted diffusion. Small area of restricted diffusion left occipital lobe compatible with acute infarct. Mild petechial hemorrhage in the left temporal lobe infarct and also in the left putamen infarct. Generalized atrophy. Chronic microvascular ischemic change in the white matter. Vascular: Small caliber left internal carotid artery through the cavernous segment. Normal arterial flow void in the right internal carotid artery and both vertebral arteries. Skull and upper cervical spine: No focal skeletal lesion. Sinuses/Orbits: Mucosal edema paranasal sinuses. Air-fluid level sphenoid sinus and right maxillary sinus. 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 Mild petechial hemorrhage associated with the infarct. Atrophy and chronic microvascular ischemic change. Electronically Signed: By: Juanda Crumble  Carlis Abbott M.D. On: 11/17/2021 17:59   ? ?PHYSICAL EXAM ? ?Physical Exam  ?Constitutional: Appears well-developed and well-nourished pleasant elderly Caucasian lady not in distress.  ?Cardiovascular: Normal rate and regular rhythm.  ?Respiratory: Effort normal, non-labored breathing on supplemental 02 via [place] ? ? ?NEURO:  ?Mental Status: AA&Ox1 ?Speech/Language: receptive and expressive aphasia present.  Patient can speak single words and answer yes/no questions.  Able to name 2/3 common objects, repetition impaired.  Speech is dysarthric ? ?Cranial Nerves:  ?II: PERRL.  ?III, IV, VI: EOMI. Eyelids elevate  symmetrically.  ?VII: Smile is symmetrical.   ?VIII: hearing intact to voice. ?XII: tongue is midline without fasciculations. ?Motor: 5/5 strength to all muscle groups tested.  ?Tone: is normal and bulk is normal ?Sensation- Intact to light touch bilaterally. ?Coordination: No drift.  ?Gait- deferred ? ? ? ?ASSESSMENT/PLAN ?[name] is a 81 y.o. female with history of Anginal pain , Aortic atherosclerosis, Cancer , Carotid artery stenosis, CKD stage III ([place]), Coronary artery disease, Diastolic dysfunction, Dysarthria, GER, Hepatic steatosis, HLD (hyperlipidemia), Hypertension, PVD, Subclavian steal syndrome, and T2DM presenting to [facility] with slurred speech.  CT head initially showed an acute left MCA infarct involving and CTA head and neck showed a thrombus in the distal left M1 and proximal M2 segment as well as high-grade stenosis in the left ICA.  She was taken for left ICA angioplasty and stent placement with a left MCA thrombectomy with Dr. Estanislado Pandy with TICI3 revascularization. ASA and brilinta given post procedure.  She remained intubated postprocedure and was extubated on 4/12.  She will move out of the ICU today and plans to be discharged to CIR at [facility] per family request. ? 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  ?Code Stroke CT head Acute left MCA infarct involving the left temporal lobe and temporoparietal lobe and left basal ganglia. Aspects 6 ?Repeat Head CT- Evolving left MCA territory infarct involving the left caudate. Diffuse contrast staining within the left temporal region, likely reflecting areas of evolving ischemia as well. No acute intracranial hemorrhage. ?CTA head & neck -  Thrombus in the distal Lt M1 and proximal M2 segments ?CT perfusion does not demonstrate infarct in Lt area ?Post IR CT no evidence of intracranial hemorrhage. ?MRI acute MCA infarct involving left caudate, left putamen and left temporal lobe with mild petechial hemorrhage ?2D Echo EF 60-65%, moderate concentric LVH, grade 1 diastolic dysfunction, mildly dilated right atrium, no atrial level shunt ?LDL 20 ?HgbA1c 6.5 ?VTE prophylaxis -lovenox ?   ?Diet  ? Diet Carb Modified Fluid consistency: Thin; Room service appropriate? Yes with Assist  ? ?aspirin 81 mg daily prior to admission, now on aspirin 81 mg daily and Brilinta (ticagrelor) 90 mg bid.  ?Therapy recommendations: CIR ?Disposition: Pending ? ?Hypertension ?Home meds: Amlodipine 5 mg, losartan 100 mg, Coreg 3.125 mg, chlorthalidone 25 mg ?Stable ?120-140 for 24 hours ?Long-term BP goal normotensive ? ?Hyperlipidemia ?Home meds: Crestor 20 mg, Zetia 10 mg resumed in hospital ?LDL 20, goal < 70 ?Continue statin at discharge ? ?Diabetes type II Controlled ?Home meds: None ?HgbA1c 6.5, goal < 7.0 ?CBGs ?Recent Labs  ?  11/18/21 ?0346 11/18/21 ?0802 11/18/21 ?1118  ?GLUCAP 93 106* 129*  ? ?  ?  SSI ? ?Other Stroke Risk Factors ?Advanced Age >/= 59  ?Obesity, Body mass index is 30.66 kg/m?., BMI >/= 30 associated with increased stroke risk, recommend weight loss, diet and exercise as appropriate  ?Coronary artery disease ?Congestive heart failure ?Isosorbide mononitrate 60 mg ? ?Other Active Problems ?GERD ?Prilosec 20 mg ?CKD Stage III ?Cr 1.07, GFR 52 ?Leukocytosis ?WBC 19.5-> 11.1 ?Daily CBC, monitor fever curve ? ?Hospital day # 2 ? ?Patient seen and examined by NP/APP with MD. MD to update note as needed.  ? ?Alto , MSN, AGACNP-BC ?[facility] ?See Amion for schedule and pager information ?11/18/2021 2:02 PM ?  ?I have personally obtained history,examined this patient, reviewed notes, independently viewed imaging studies, participated  in medical decision making and plan of care.ROS completed by me personally and pertinent positives fully documented  I have made any additions or clarifications directly to the above note. Agree with note above.  Fraser Din

## 2021-11-18 NOTE — Progress Notes (Signed)
Patient's daughter has requested placement at [facility] in patient rehab. I have informed case management. ?

## 2021-11-18 NOTE — Progress Notes (Signed)
Physical Therapy Treatment ?Patient Details ?Name: Cassidy Hernandez ?MRN: [id number] ?DOB: 1941/07/15 ?Today's Date: 11/18/2021 ? ? ?History of Present Illness 81 y.o. female presents to [facility] hospital on 11/16/2021 with global aphasia. CT  head demonstrates L MCA CVA. Pt underwent revascularization on 11/16/2021. PMH includes PVD, CAD, HTN, HLD, DM2. ? ?  ?PT Comments  ? ? Pt tolerates treatment well, progressing to out of bed transfer and ambulation. Pt demonstrates improved expressive communication although still not communicating in sentences. Pt benefits from physical assistance to mobilize in bed, as well as safety for ambulation over short distances. Pt will benefit from continued aggressive mobilization in an effort to restore her prior level of function. PT recommends AIR admission.   ?Recommendations for follow up therapy are one component of a multi-disciplinary discharge planning process, led by the attending physician.  Recommendations may be updated based on patient status, additional functional criteria and insurance authorization. ? ?Follow Up Recommendations ? Acute inpatient rehab (3hours/day) ?  ?  ?Assistance Recommended at Discharge Intermittent Supervision/Assistance  ?Patient can return home with the following A little help with walking and/or transfers;A little help with bathing/dressing/bathroom;Help with stairs or ramp for entrance;Direct supervision/assist for medications management;Direct supervision/assist for financial management;Assist for transportation ?  ?Equipment Recommendations ? BSC/3in1  ?  ?Recommendations for Other Services   ? ? ?  ?Precautions / Restrictions Precautions ?Precautions: Fall ?Restrictions ?Weight Bearing Restrictions: No  ?  ? ?Mobility ? Bed Mobility ?Overal bed mobility: Needs Assistance ?Bed Mobility: Rolling, Sidelying to Sit ?Rolling: Min assist ?Sidelying to sit: Mod assist ?  ?  ?  ?General bed mobility comments: use of rails, hand hold and trunk support to  elevate into sitting ?  ? ?Transfers ?Overall transfer level: Needs assistance ?Equipment used: Rolling walker (2 wheels) ?Transfers: Sit to/from Stand, Bed to chair/wheelchair/BSC ?Sit to Stand: Min assist ?  ?Step pivot transfers: Min assist ?  ?  ?  ?  ?  ? ?Ambulation/Gait ?Ambulation/Gait assistance: Min guard ?Gait Distance (Feet): 20 Feet ?Assistive device: Rolling walker (2 wheels) ?Gait Pattern/deviations: Step-to pattern ?Gait velocity: reduced ?Gait velocity interpretation: <1.31 ft/sec, indicative of household ambulator ?  ?General Gait Details: pt with slowed step-to gait, reduced gait speed and cadence ? ? ?Stairs ?  ?  ?  ?  ?  ? ? ?Wheelchair Mobility ?  ? ?Modified Rankin (Stroke Patients Only) ?Modified Rankin (Stroke Patients Only) ?Pre-Morbid Rankin Score: Slight disability ?Modified Rankin: Moderately severe disability ? ? ?  ?Balance Overall balance assessment: Needs assistance ?Sitting-balance support: No upper extremity supported, Feet supported ?Sitting balance-Leahy Scale: Poor ?Sitting balance - Comments: minG-minA with dynamic sitting attempts ?  ?Standing balance support: Single extremity supported, Reliant on assistive device for balance ?Standing balance-Leahy Scale: Poor ?Standing balance comment: minG for dynamic standing when performing hygiene tasks ?  ?  ?  ?  ?  ?  ?  ?  ?  ?  ?  ?  ? ?  ?Cognition Arousal/Alertness: Awake/alert ?Behavior During Therapy: Flat affect ?Overall Cognitive Status: Difficult to assess ?  ?  ?  ?  ?  ?  ?  ?  ?  ?  ?  ?  ?  ?  ?  ?  ?General Comments: pt follows one step commands consistently, answers yes/no questions reliably ?  ?  ? ?  ?Exercises   ? ?  ?General Comments General comments (skin integrity, edema, etc.): VSS on RA, PT weans to RA with sats  in low 90s. ?  ?  ? ?Pertinent Vitals/Pain Pain Assessment ?Pain Assessment: No/denies pain  ? ? ?Home Living   ?  ?  ?  ?  ?  ?  ?  ?  ?  ?   ?  ?Prior Function    ?  ?  ?   ? ?PT Goals (current goals  can now be found in the care plan section) Acute Rehab PT Goals ?Patient Stated Goal: to go to rehab ?Progress towards PT goals: Progressing toward goals ? ?  ?Frequency ? ? ? Min 4X/week ? ? ? ?  ?PT Plan Current plan remains appropriate  ? ? ?Co-evaluation   ?  ?  ?  ?  ? ?  ?AM-PAC PT "6 Clicks" Mobility   ?Outcome Measure ? Help needed turning from your back to your side while in a flat bed without using bedrails?: A Little ?Help needed moving from lying on your back to sitting on the side of a flat bed without using bedrails?: A Lot ?Help needed moving to and from a bed to a chair (including a wheelchair)?: A Little ?Help needed standing up from a chair using your arms (e.g., wheelchair or bedside chair)?: A Little ?Help needed to walk in hospital room?: A Little ?Help needed climbing 3-5 steps with a railing? : Total ?6 Click Score: 15 ? ?  ?End of Session   ?Activity Tolerance: Patient tolerated treatment well ?Patient left: in chair;with call bell/phone within reach;with chair alarm set ?Nurse Communication: Mobility status ?PT Visit Diagnosis: Other abnormalities of gait and mobility (R26.89);Muscle weakness (generalized) (M62.81);Other symptoms and signs involving the nervous system (R29.898) ?  ? ? ?Time: 7425-9563 ?PT Time Calculation (min) (ACUTE ONLY): 28 min ? ?Charges:  $Gait Training: 8-22 mins ?$Therapeutic Activity: 8-22 mins          ?          ? ?Zenaida Niece, PT, DPT ?Acute Rehabilitation ?Pager: [phone] ?Office [phone] ? ? ? ?Zenaida Niece ?11/18/2021, 3:39 PM ? ?

## 2021-11-18 NOTE — Progress Notes (Addendum)
? ?NAME:  Cassidy Hernandez, MRN:  [id number], DOB:  [birth year], LOS: 2 ?ADMISSION DATE:  11/16/2021, CONSULTATION DATE:  4/11 ?REFERRING MD:  Dr. Lorrin Goodell, CHIEF COMPLAINT:    ? ?History of Present Illness:  ?Patient is encephalopathic and/or intubated. Therefore history has been obtained from chart review.  ? ?81 year old female with past medical history as below, which is significant for coronary artery disease, peripheral vascular disease, hypertension, hyperlipidemia, and diabetes who presented to [facility] on 4/11 with concern for stroke.  Family spoke to her over the phone and noted that she was slurring her words at approximately 2 PM.  Last known well at 1030 the night before.  Upon presentation to the ED [facility] was 9. CT of the head with evolving L MCA stroke. CTA with defects in the distal L MCA and proximal M2. The patient was transferred to [facility] for thrombectomy. Had revascularization of left MCA M1 with TICI 3 as well as angioplasty + stent in the L ICA. Post procedure the patient was transferred to the ICU on the ventilator and PCCM was consulted.  ? ?Pertinent  Medical History  ? has a past medical history of Anginal pain ([place]), Aortic atherosclerosis, Cancer ([place]), Carotid artery stenosis (02/01/2010), Chronic bilateral low back pain with left-sided sciatica, CKD (chronic kidney disease), stage III ([place]), Coronary artery disease, Degenerative arthritis, Diastolic dysfunction, Dysarthria, GERD (gastroesophageal reflux disease), Hepatic steatosis, History of kidney stones, HLD (hyperlipidemia), Hypertension, Kidney stone, Lumbar radiculopathy, Major depression in remission [facility]), PVD (peripheral vascular disease) ([place]), Renal cyst, right (06/02/2015), Subclavian steal syndrome, and T2DM (type 2 diabetes mellitus) ([place]). ? ? ?Significant Hospital Events: ?Including procedures, antibiotic start and stop dates in addition to other pertinent events   ?4/11 Admit with L MCA CVA, to IR for angioplasty + stent to L ICA.  To ICU on vent.  ECHO with LVEF 60-65%, grade I DD, RVSP 29, mild RA dilation, trivial MR/AVR ?4/12 Extubated ? ?Interim History / Subjective:  ?Afebrile ?Worked with PT ?Bibo 5L  ?Glucose range 93-116 ? ?Objective   ?Blood pressure (!) 112/53, pulse 77, temperature 97.9 ?F (36.6 ?C), temperature source Axillary, resp. rate 19, weight 78.5 kg, SpO2 97 %. ?   ?Vent Mode: CPAP;PSV ?FiO2 (%):  [40 %] 40 % ?PEEP:  [5 cmH20] 5 cmH20 ?Pressure Support:  [14 cmH20] 14 cmH20  ? ?Intake/Output Summary (Last 24 hours) at 11/18/2021 0724 ?Last data filed at 11/18/2021 0600 ?Gross per 24 hour  ?Intake 1835.01 ml  ?Output 975 ml  ?Net 860.01 ml  ? ?Filed Weights  ? 11/16/21 2200  ?Weight: 78.5 kg  ? ? ?Examination: ?General: elderly adult female lying in bed in NAD   ?HEENT: MM pink/moist, wearing glasses, dysarthric speech ?Neuro: Eyes open, awake, moves all extremities ?CV: s1s2 RRR, no m/r/g ?PULM: non-labored at rest, lungs bilaterally clear ?GI: soft, bsx4 active  ?Extremities: warm/dry, no edema  ?Skin: no rashes or lesions ? ?Resolved Hospital Problem list   ?Ventilator Support Post CVA  ? ?Assessment & Plan:  ? ?L MCA CVA ?Did not receive tpa (window). S/p mechanical thrombectomy to left M1 with tici 3 and L ICA stenting. Post cangrelor infusion.  ECHO with normal EF.  ?-Stroke per Neurology  ?-SBP goal 120-140 ?-PT/OT/SLP  ?-delirium prevention measures  ? ?DM II ?Hgb A1c 6.5 ?-moderate SSI  ? ?HTN, HLD, CAD ?-BP within goal, hold home agents for now  ?-ASA, statin  ? ?At Risk Malnutrition  ?-SLP appreciated, cleared for carb modified diet  ?-  stop IVF 4/14, ensure adequate intake  ? ?Best Practice (right click and "Reselect all SmartList Selections" daily)  ?Diet/type: NPO ?DVT prophylaxis: LMWH ?GI prophylaxis: PPI ?Lines: N/A ?Foley:  Yes, and it is still needed ?Code Status:  full code ?Last date of multidisciplinary goals of care discussion: full code, defer Sesser to primary team  ? ? ?PCCM will be available PRN.  Please  call back if new needs arise.  ? ?Critical Care Time: n/a ?  ? ?Noe Gens, MSN, APRN, NP-C, AGACNP-BC ?[facility] ?11/18/2021, 7:24 AM ? ? ?Please see [email or web address] for pager details.  ? ?From 7A-7P if no response, please call [phone] ?After hours, please call Warren Lacy [phone] ? ? ? ? ? ? ? ? ?

## 2021-11-18 NOTE — Progress Notes (Addendum)
? ? ?Referring Physician(s): Dr. Donnetta Simpers ? ?Supervising Physician: Luanne Bras ? ?Patient Status:  [facility] - In-pt ? ?Chief Complaint: ? ?Code stroke, S/p L MCA thrombectomy with L ICA stent placement by Dr. Rosann Auerbach on 11/16/21.  ? ?Subjective: ? ?Pt laying in bed, NAD. RN at bedside.  ?Patient able to follow commands, has expression aphasia.  ? ?Allergies: ?Sulfa antibiotics ? ?Medications: ?Prior to Admission medications   ?Medication Sig Start Date End Date Taking? Authorizing Provider  ?acetaminophen (TYLENOL) 325 MG tablet Take 650 mg by mouth every 8 (eight) hours as needed for moderate pain or mild pain (Back).   Yes [provider]  ?amLODipine (NORVASC) 5 MG tablet Take 5 mg by mouth daily.  12/04/14  Yes [provider]  ?aspirin EC 325 MG tablet Take 1 tablet (325 mg total) by mouth daily. ?Patient taking differently: Take 81 mg by mouth daily. 06/11/21  Yes Lattie Corns, PA-C  ?carvedilol (COREG) 3.125 MG tablet Take 3.125 mg by mouth 2 (two) times daily. 10/05/19  Yes [provider]  ?chlorthalidone (HYGROTON) 25 MG tablet Take 12.5 mg by mouth daily. 09/23/14  Yes [provider]  ?docusate sodium (COLACE) 100 MG capsule Take 1 capsule (100 mg total) by mouth 2 (two) times daily. ?Patient taking differently: Take 100 mg by mouth 2 (two) times daily as needed for mild constipation. 06/11/21  Yes Lattie Corns, PA-C  ?escitalopram (LEXAPRO) 10 MG tablet Take 10 mg by mouth daily. 05/13/21  Yes [provider]  ?ezetimibe (ZETIA) 10 MG tablet Take 10 mg by mouth daily. 06/23/18  Yes [provider]  ?isosorbide mononitrate (IMDUR) 60 MG 24 hr tablet Take 60 mg by mouth daily.   Yes [provider]  ?losartan (COZAAR) 100 MG tablet Take 100 mg by mouth daily.    Yes [provider]  ?magnesium chloride (SLOW-MAG) 64 MG TBEC SR tablet Take 1 tablet by mouth daily.   Yes [provider]  ?Multiple Vitamin  (MULTIVITAMIN) capsule Take 1 capsule by mouth daily.   Yes [provider]  ?nitroGLYCERIN (NITROSTAT) 0.4 MG SL tablet Place 0.4 mg under the tongue every 5 (five) minutes as needed for chest pain. 04/13/18 09/24/28 Yes [provider]  ?omeprazole (PRILOSEC) 20 MG capsule Take 20 mg by mouth daily.    Yes [provider]  ?rosuvastatin (CRESTOR) 20 MG tablet Take 20 mg by mouth daily.  12/01/14  Yes [provider]  ? ? ? ?Vital Signs: ?BP (!) 127/54   Pulse 78   Temp 97.7 ?F (36.5 ?C) (Oral)   Resp (!) 21   Wt 173 lb 1 oz (78.5 kg)   SpO2 99%   BMI 30.66 kg/m?  ? ?Physical Exam ?Vitals reviewed.  ?Constitutional:   ?   General: She is not in acute distress. ?   Appearance: Normal appearance. She is not ill-appearing.  ?HENT:  ?   Head: Normocephalic.  ?Cardiovascular:  ?   Rate and Rhythm: Normal rate and regular rhythm.  ?Pulmonary:  ?   Effort: Pulmonary effort is normal.  ?   Comments: On 5L via [place] ?Abdominal:  ?   General: Abdomen is flat.  ?Skin: ?   General: Skin is warm and dry.  ?   Coloration: Skin is not jaundiced or pale.  ?Neurological:  ?   Mental Status: She is alert.  ?   Comments: Alert, awake.  ?Expression aphasia ?PERRL  ?EOMs intact.  ?No facial  asymmetry. ?Tongue midline  ?Motor power intact all 4, no focal weakness  ? ?  ? ? ?Imaging: ?CT HEAD WO CONTRAST (5MM) ? ?Result Date: 11/17/2021 ?CLINICAL DATA:  Follow-up examination for acute stroke. EXAM: CT HEAD WITHOUT CONTRAST TECHNIQUE: Contiguous axial images were obtained from the base of the skull through the vertex without intravenous contrast. RADIATION DOSE REDUCTION: This exam was performed according to the departmental dose-optimization program which includes automated exposure control, adjustment of the mA and/or kV according to patient size and/or use of iterative reconstruction technique. COMPARISON:  Prior CT and CTA from earlier the same day. FINDINGS: Brain: Age-related cerebral atrophy with  chronic microvascular ischemic disease. Evolving left MCA territory infarct noted involving the left caudate. Diffuse contrast staining seen within the left temporal region, likely areas of evolving ischemia as well. No acute intracranial hemorrhage. No other new large vessel territory infarct. No mass lesion or midline shift. No hydrocephalus or extra-axial fluid collection. Vascular: Residual contrast material seen throughout the intracranial vasculature. Skull: Scalp soft tissues and calvarium demonstrate no new abnormality. Sinuses/Orbits: Globes orbital soft tissues demonstrate no acute finding. Scattered mucosal thickening with air fluid levels noted within the paranasal sinuses. Patient is intubated. Mastoid air cells remain clear. Other: None. IMPRESSION: 1. Evolving left MCA territory infarct involving the left caudate. Diffuse contrast staining within the left temporal region, likely reflecting areas of evolving ischemia as well. No acute intracranial hemorrhage. 2. No other new acute intracranial abnormality. Electronically Signed   By: Jeannine Boga M.D.   On: 11/17/2021 01:31  ? ?MR BRAIN WO CONTRAST ? ?Addendum Date: 11/17/2021   ?ADDENDUM REPORT: 11/17/2021 18:10 ADDENDUM: These results will be called to the ordering clinician or representative by the Radiologist Assistant, and communication documented in the PACS or [employer]. Electronically Signed   By: Franchot Gallo M.D.   On: 11/17/2021 18:10  ? ?Result Date: 11/17/2021 ?CLINICAL DATA:  Stroke post thrombectomy left MCA. EXAM: MRI HEAD WITHOUT CONTRAST TECHNIQUE: Multiplanar, multiecho pulse sequences of the brain and surrounding structures were obtained without intravenous contrast. COMPARISON:  CT head 11/17/2021 FINDINGS: Brain: Acute infarct in the left caudate and left putamen. This is similar to the original CT of 11/16/2021. Additional areas of infarct in the left lateral and posterior temporal lobe also similar to that seen on  the prior CT. These areas show restricted diffusion. Small area of restricted diffusion left occipital lobe compatible with acute infarct. Mild petechial hemorrhage in the left temporal lobe infarct and also in the left putamen infarct. Generalized atrophy. Chronic microvascular ischemic change in the white matter. Vascular: Small caliber left internal carotid artery through the cavernous segment. Normal arterial flow void in the right internal carotid artery and both vertebral arteries. Skull and upper cervical spine: No focal skeletal lesion. Sinuses/Orbits: Mucosal edema paranasal sinuses. Air-fluid level sphenoid sinus and right maxillary sinus. 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 Mild petechial hemorrhage associated with the infarct. Atrophy and chronic microvascular ischemic change. Electronically Signed: By: Franchot Gallo M.D. On: 11/17/2021 17:59  ? ?Portable Chest x-ray ? ?Result Date: 11/16/2021 ?CLINICAL DATA:  Check endotracheal tube placement EXAM: PORTABLE CHEST 1 VIEW COMPARISON:  None FINDINGS: Cardiac shadow is at the upper limits of normal in size. Endotracheal tube is seen approximately 5.4 cm above the carina. Gastric catheter extends into the stomach. Lungs are hypoinflated but clear. Calcified breast implants are noted bilaterally. Prior right shoulder surgery are is  seen. IMPRESSION: No acute abnormality noted. Tubes and lines in satisfactory position. Electronically Signed   By: Inez Catalina M.D.   On: 11/16/2021 23:36  ? ?ECHOCARDIOGRAM COMPLETE ? ?Result Date: 11/17/2021 ?   ECHOCARDIOGRAM REPORT   Patient Name:   Cassidy Hernandez Date of Exam: 11/17/2021 Medical Rec #:  [id number]     Height:       63.0 in Accession #:    [id number]    Weight:       173.1 lb Date of Birth:  [birth year]     BSA:          1.818 m? Patient Age:    81 years      BP:           116/44 mmHg Patient Gender: F             HR:           76 bpm. Exam  Location:  Inpatient Procedure: 2D Echo Indications:    Stroke  History:        Patient has no prior history of Echocardiogram examinations.  Sonographer:    Jefferey Pica Referring Phys: [id number] [facility] [place]

## 2021-11-18 NOTE — TOC Initial Note (Addendum)
Transition of Care (TOC) - Initial/Assessment Note  ? ? ?Patient Details  ?Name: Cassidy Hernandez ?MRN: [id number] ?Date of Birth: 1940/11/17 ? ?Transition of Care [facility]) CM/SW Contact:    ?Ella Bodo, RN ?Phone Number: ?11/18/2021, 5:11 PM ? ?Clinical Narrative:                 ?81 y.o. female presents to [facility] hospital on 11/16/2021 with global aphasia. CT  head demonstrates L MCA CVA. Pt underwent revascularization on 11/16/2021. ?PTA, patient independent and living with daughter, who can provide 24h assistance.  PT/OT recommending inpatient rehab, and pt/family prefer [facility], as patient has had rehab there before.  Left message with Clemon Chambers in admissions at [facility] for referral follow up.  Spoke with [name] and explained referral process.  TOC will provide updates as available.   ? ?Expected Discharge Plan: [place] ?Barriers to Discharge: Continued Medical Work up ? ? ?Patient Goals and CMS Choice ?  ?CMS Medicare.gov Compare Post Acute Care list provided to:: Patient Represenative (must comment) ([name]) ?Choice offered to / list presented to : Adult Children, Patient ? ?Expected Discharge Plan and Services ?Expected Discharge Plan: [facility] ?  ?Discharge Planning Services: CM Consult ?Post Acute Care Choice: IP Rehab ?Living arrangements for the past 2 months: [place] ?                ?  ?  ?  ?  ?  ?  ?  ?  ?  ?  ? ?Prior Living Arrangements/Services ?Living arrangements for the past 2 months: [place] ?Lives with:: Adult Children ?  ?       ?Need for Family Participation in Patient Care: Yes (Comment) ?Care giver support system in place?: Yes (comment) ?  ?Criminal Activity/Legal Involvement Pertinent to Current Situation/Hospitalization: No - Comment as needed ? ?  ?  ? ?Permission Sought/Granted ?  ?Permission granted to share information with :  (patient with expressive aphasia) ? Share Information with NAME: Maryelizabeth Kaufmann ? Permission granted to share info w AGENCY: [place] ? Permission granted to share info w Relationship: daughter ? Permission granted to share info w Contact Information: [phone] [facility] Phone) ? ?Emotional Assessment ?  ?Attitude/Demeanor/Rapport: Engaged ?Affect (typically observed): Accepting ?Orientation: : Oriented to Self (Follows commands) ?  ?  ? 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 Diagnosis Date Noted  ? Acute ischemic left MCA stroke ([place]) 11/16/2021  ? 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 ?PCP:  Kirk Ruths, MD ?Pharmacy:   ?[id number] [id number]?Phone: [phone]: [phone]? ? ? ? ?Social Determinants of Health (SDOH) Interventions ?  ? ?Readmission Risk Interventions ?   ? View : No data to display.  ?  ?  ?  ? ?JReinaldo Raddle RN, BSN  ?Trauma/Neuro ICU Case Manager ?[phone]? ? ?

## 2021-11-18 NOTE — TOC CAGE-AID Note (Signed)
Transition of Care (TOC) - CAGE-AID Screening ? ? ?Patient Details  ?Name: Cassidy Hernandez ?MRN: [id number] ?Date of Birth: 17-Feb-1941 ? ?Transition of Care (TOC) CM/SW Contact:    ?[place], LCSW ?Phone Number: ?11/18/2021, 9:51 AM ? ? ?Clinical Narrative: ? ?Not appropriate for CAGE-AID at this time. ? ?CAGE-AID Screening: ?Substance Abuse Screening unable to be completed due to: : Patient unable to participate ? ?  ?  ?  ?  ?  ? ?  ? ?  ? ? ? ? ? ? ?

## 2021-11-19 DIAGNOSIS — I63512 Cerebral infarction due to unspecified occlusion or stenosis of left middle cerebral artery: Secondary | ICD-10-CM | POA: Diagnosis not present

## 2021-11-19 LAB — GLUCOSE, CAPILLARY
Glucose-Capillary: 96 mg/dL (ref 70–99)
Glucose-Capillary: 107 mg/dL — ABNORMAL HIGH (ref 70–99)
Glucose-Capillary: 102 mg/dL — ABNORMAL HIGH (ref 70–99)
Glucose-Capillary: 149 mg/dL — ABNORMAL HIGH (ref 70–99)
Glucose-Capillary: 144 mg/dL — ABNORMAL HIGH (ref 70–99)
Glucose-Capillary: 102 mg/dL — ABNORMAL HIGH (ref 70–99)

## 2021-11-19 LAB — CBC
HCT: 25.9 % — ABNORMAL LOW (ref 36.0–46.0)
Hemoglobin: 8.6 g/dL — ABNORMAL LOW (ref 12.0–15.0)
MCH: 29.5 pg (ref 26.0–34.0)
MCHC: 33.2 g/dL (ref 30.0–36.0)
MCV: 88.7 fL (ref 80.0–100.0)
Platelets: 202 10*3/uL (ref 150–400)
RBC: 2.92 MIL/uL — ABNORMAL LOW (ref 3.87–5.11)
RDW: 14 % (ref 11.5–15.5)
WBC: 10.3 10*3/uL (ref 4.0–10.5)
nRBC: 0 % (ref 0.0–0.2)

## 2021-11-19 LAB — BASIC METABOLIC PANEL
Anion gap: 8 (ref 5–15)
BUN: 15 mg/dL (ref 8–23)
CO2: 22 mmol/L (ref 22–32)
Calcium: 8 mg/dL — ABNORMAL LOW (ref 8.9–10.3)
Chloride: 104 mmol/L (ref 98–111)
Creatinine, Ser: 0.99 mg/dL (ref 0.44–1.00)
GFR, Estimated: 57 mL/min — ABNORMAL LOW (ref >60)
Glucose, Bld: 97 mg/dL (ref 70–99)
Potassium: 3.4 mmol/L — ABNORMAL LOW (ref 3.5–5.1)
Sodium: 134 mmol/L — ABNORMAL LOW (ref 135–145)

## 2021-11-19 MED ORDER — POTASSIUM CHLORIDE CRYS ER 20 MEQ PO TBCR
40.0000 meq | EXTENDED_RELEASE_TABLET | Freq: Once | ORAL | Status: AC
  Administered 2021-11-19: 40 meq via ORAL
  Filled 2021-11-19: qty 2

## 2021-11-19 MED ORDER — INSULIN ASPART 100 UNIT/ML IJ SOLN
0.0000 [IU] | Freq: Three times a day (TID) | INTRAMUSCULAR | Status: DC
  Administered 2021-11-19 – 2021-11-21 (×6): 2 [IU] via SUBCUTANEOUS
  Administered 2021-11-22: 3 [IU] via SUBCUTANEOUS
  Administered 2021-11-22 – 2021-11-24 (×5): 2 [IU] via SUBCUTANEOUS

## 2021-11-19 NOTE — Progress Notes (Signed)
Physical Therapy Treatment ?Patient Details ?Name: Cassidy Hernandez ?MRN: [id number] ?DOB: [birth year] ?Today's Date: 11/19/2021 ? ? ?History of Present Illness 81 y.o. female presents to [facility] hospital on 11/16/2021 with global aphasia. CT  head demonstrates L MCA CVA. Pt underwent revascularization on 11/16/2021. PMH includes PVD, CAD, HTN, HLD, DM2. ? ?  ?PT Comments  ? ? Pt tolerates treatment well with improved ambulation distances. Pt continues to require physical assistance for bed mobility and will benefit from further strength training to restore independence in functional mobility. Pt will also benefit from further challenges to dynamic standing balance. PT continues to recommend AIR admission at this time.  ?Recommendations for follow up therapy are one component of a multi-disciplinary discharge planning process, led by the attending physician.  Recommendations may be updated based on patient status, additional functional criteria and insurance authorization. ? ?Follow Up Recommendations ? Acute inpatient rehab (3hours/day) ?  ?  ?Assistance Recommended at Discharge Intermittent Supervision/Assistance  ?Patient can return home with the following A little help with walking and/or transfers;A little help with bathing/dressing/bathroom;Help with stairs or ramp for entrance;Direct supervision/assist for medications management;Direct supervision/assist for financial management;Assist for transportation ?  ?Equipment Recommendations ? BSC/3in1  ?  ?Recommendations for Other Services   ? ? ?  ?Precautions / Restrictions Precautions ?Precautions: Fall ?Restrictions ?Weight Bearing Restrictions: No  ?  ? ?Mobility ? Bed Mobility ?Overal bed mobility: Needs Assistance ?Bed Mobility: Rolling, Sidelying to Sit, Sit to Supine ?Rolling: Min guard ?Sidelying to sit: Min assist, HOB elevated ?  ?Sit to supine: Min assist ?  ?General bed mobility comments: pt continues to require assistance with bed mobility due to weakness ?   ? ?Transfers ?Overall transfer level: Needs assistance ?Equipment used: Rolling walker (2 wheels) ?Transfers: Sit to/from Stand ?Sit to Stand: Min guard, From elevated surface ?  ?  ?  ?  ?  ?  ?  ? ?Ambulation/Gait ?Ambulation/Gait assistance: Min guard ?Gait Distance (Feet): 120 Feet ?Assistive device: Rolling walker (2 wheels) ?Gait Pattern/deviations: Step-through pattern ?Gait velocity: reduced ?Gait velocity interpretation: <1.31 ft/sec, indicative of household ambulator ?  ?General Gait Details: pt with slowed step-through gait, no noticeable balance deviations ? ? ?Stairs ?  ?  ?  ?  ?  ? ? ?Wheelchair Mobility ?  ? ?Modified Rankin (Stroke Patients Only) ?Modified Rankin (Stroke Patients Only) ?Pre-Morbid Rankin Score: Slight disability ?Modified Rankin: Moderately severe disability ? ? ?  ?Balance Overall balance assessment: Needs assistance ?Sitting-balance support: No upper extremity supported, Feet supported ?Sitting balance-Leahy Scale: Fair ?  ?  ?Standing balance support: Single extremity supported, Bilateral upper extremity supported, Reliant on assistive device for balance ?Standing balance-Leahy Scale: Poor ?Standing balance comment: minG ?  ?  ?  ?  ?  ?  ?  ?  ?  ?  ?  ?  ? ?  ?Cognition Arousal/Alertness: Awake/alert ?Behavior During Therapy: Flat affect ?Overall Cognitive Status: Difficult to assess ?  ?  ?  ?  ?  ?  ?  ?  ?  ?  ?  ?  ?  ?  ?  ?  ?General Comments: pt is able to respnd in short phrases but remains difficult to assss cognition due to expressive deficits ?  ?  ? ?  ?Exercises   ? ?  ?General Comments General comments (skin integrity, edema, etc.): VSS on RA ?  ?  ? ?Pertinent Vitals/Pain Pain Assessment ?Pain Assessment: Faces ?Faces Pain Scale: Hurts little more ?  Pain Location: LE ?Pain Descriptors / Indicators: Sore ?Pain Intervention(s): Monitored during session  ? ? ?Home Living   ?  ?  ?  ?  ?  ?  ?  ?  ?  ?   ?  ?Prior Function    ?  ?  ?   ? ?PT Goals (current goals can  now be found in the care plan section) Acute Rehab PT Goals ?Patient Stated Goal: to go to rehab ?Progress towards PT goals: Progressing toward goals ? ?  ?Frequency ? ? ? Min 4X/week ? ? ? ?  ?PT Plan Current plan remains appropriate  ? ? ?Co-evaluation   ?  ?  ?  ?  ? ?  ?AM-PAC PT "6 Clicks" Mobility   ?Outcome Measure ? Help needed turning from your back to your side while in a flat bed without using bedrails?: A Little ?Help needed moving from lying on your back to sitting on the side of a flat bed without using bedrails?: A Little ?Help needed moving to and from a bed to a chair (including a wheelchair)?: A Little ?Help needed standing up from a chair using your arms (e.g., wheelchair or bedside chair)?: A Little ?Help needed to walk in hospital room?: A Little ?Help needed climbing 3-5 steps with a railing? : Total ?6 Click Score: 16 ? ?  ?End of Session   ?Activity Tolerance: Patient tolerated treatment well ?Patient left: in bed;with call bell/phone within reach;with bed alarm set;with family/visitor present ?Nurse Communication: Mobility status ?PT Visit Diagnosis: Other abnormalities of gait and mobility (R26.89);Muscle weakness (generalized) (M62.81);Other symptoms and signs involving the nervous system (R29.898) ?  ? ? ?Time: 9357-0177 ?PT Time Calculation (min) (ACUTE ONLY): 19 min ? ?Charges:  $Gait Training: 8-22 mins          ?          ? ?Zenaida Niece, PT, DPT ?Acute Rehabilitation ?Pager: [phone] ?Office [phone] ? ? ? ?Zenaida Niece ?11/19/2021, 1:02 PM ? ?

## 2021-11-19 NOTE — Progress Notes (Signed)
[facility] ADULT ICU REPLACEMENT PROTOCOL ? ? ?The patient does apply for the [facility] Adult ICU Electrolyte Replacment Protocol based on the criteria listed below:  ? ?1.Exclusion criteria: TCTS patients, ECMO patients, and Dialysis patients ?2. Is GFR >/= 30 ml/min? Yes.    ?Patient's GFR today is 57 ?3. Is SCr </= 2? Yes ?Patient's SCr is 0.99 mg/dL ?4. Did SCr increase >/= 0.5 in 24 hours? No. ?5.Pt's weight >40kg  Yes.   ?6. Abnormal electrolyte(s): K+ 3.4  ?7. Electrolytes replaced per protocol ?8.  Call MD STAT for K+ </= 2.5, Phos </= 1, or Mag </= 1 ?Physician:  n/a ? ?Cassidy Hernandez 11/19/2021 5:53 AM ? ?

## 2021-11-19 NOTE — Progress Notes (Signed)
Report given to [facility] 3W. Daughter updated on transfer status. Patient transferred with all belongings.  ?

## 2021-11-19 NOTE — TOC Progression Note (Signed)
Transition of Care (TOC) - Progression Note  ? ? ?Patient Details  ?Name: Cassidy Hernandez ?MRN: [id number] ?Date of Birth: October 27, 1940 ? ?Transition of Care (TOC) CM/SW Contact  ?Ella Bodo, RN ?Phone Number: ?11/19/2021, 12:11 PM ? ?Clinical Narrative:    ?Faxed patient referral to Clemon Chambers at [facility], fax # [phone], per pt/family preference.  Will provide updates as available.  ? ? ?Expected Discharge Plan: [place] ?Barriers to Discharge: Continued Medical Work up ? ?Expected Discharge Plan and Services ?Expected Discharge Plan: [place] ?  ?Discharge Planning Services: CM Consult ?Post Acute Care Choice: IP Rehab ?Living arrangements for the past 2 months: [place] ?                ?  ?  ?  ?  ?  ?  ?  ?  ?  ?  ? ? ?Social Determinants of Health (SDOH) Interventions ?  ? ?Readmission Risk Interventions ?   ? View : No data to display.  ?  ?  ?  ? ?Reinaldo Raddle, RN, BSN  ?Trauma/Neuro ICU Case Manager ?[phone] ? ? ?

## 2021-11-19 NOTE — Progress Notes (Addendum)
STROKE TEAM PROGRESS NOTE  ? ?INTERVAL HISTORY ?Patient is seen in her room  with no family at the bedside.  Her vital signs are stable and she has had no acute events overnight.  Neurological exam is unchanged with continued aphasia but able to follow simple midline and a few one-step commands and trying to voice words but unable to speak.  PT/OT are recommending discharge to CIR. ? ?Vitals:  ? 11/19/21 1021 11/19/21 1100 11/19/21 1200 11/19/21 1300  ?BP: 107/62 (!) 126/40 (!) 136/53 (!) 141/51  ?Pulse: 75 65 76 91  ?Resp: 19 15 (!) 24 (!) 25  ?Temp:   98.5 ?F (36.9 ?C)   ?TempSrc:   Oral   ?SpO2: 97% 99% 100% 97%  ?Weight:      ? ?CBC:  ?Recent Labs  ?Lab 11/16/21 ?1624 11/16/21 ?2341 11/17/21 ?0205 11/18/21 ?4098 11/19/21 ?0425  ?WBC 14.6*  --  19.5* 11.1* 10.3  ?NEUTROABS 11.2*  --  15.9*  --   --   ?HGB 13.3   < > 11.6* 8.4* 8.6*  ?HCT 40.5   < > 34.6* 25.2* 25.9*  ?MCV 87.5  --  88.9 89.7 88.7  ?PLT 267  --  233 192 202  ? < > = values in this interval not displayed.  ? ? ?Basic Metabolic Panel:  ?Recent Labs  ?Lab 11/18/21 ?0313 11/19/21 ?0425  ?NA 137 134*  ?K 3.7 3.4*  ?CL 109 104  ?CO2 21* 22  ?GLUCOSE 97 97  ?BUN 25* 15  ?CREATININE 1.13* 0.99  ?CALCIUM 7.7* 8.0*  ? ? ?Lipid Panel:  ?Recent Labs  ?Lab 11/17/21 ?0205  ?CHOL 99  ?TRIG 159*  ?HDL 47  ?CHOLHDL 2.1  ?VLDL 32  ?[place] 20  ? ? ?HgbA1c:  ?Recent Labs  ?Lab 11/17/21 ?0205  ?HGBA1C 6.5*  ? ? ?Urine Drug Screen: No results for input(s): LABOPIA, COCAINSCRNUR, LABBENZ, AMPHETMU, THCU, LABBARB in the last 168 hours.  ?Alcohol Level No results for input(s): ETH in the last 168 hours. ? ?IMAGING past 24 hours ?No results found. ? ?PHYSICAL EXAM ? ?Physical Exam  ?Constitutional: Appears well-developed and well-nourished pleasant elderly Caucasian lady not in distress.  ?Cardiovascular: Normal rate and regular rhythm.  ?Respiratory: Effort normal, non-labored breathing on supplemental 02 via [place] ? ? ?NEURO:  ?Mental Status: AA&Ox1 ?Speech/Language:  receptive and expressive aphasia present.  Patient can speak single words and answer yes/no questions.   Naming and repetition impaired.  Speech is dysarthric but more understandable today ? ?Cranial Nerves:  ?II: PERRL.  ?III, IV, VI: EOMI. Eyelids elevate symmetrically.  ?VII: Smile is symmetrical.   ?VIII: hearing intact to voice. ?XII: tongue is midline without fasciculations. ?Motor: 5/5 strength to all muscle groups tested.  ?Tone: is normal and bulk is normal ?Sensation- Intact to light touch bilaterally. ?Coordination: No drift.  ?Gait- deferred ? ? ? ?ASSESSMENT/PLAN ?Ms. ZYLEE MARCHIANO is a 81 y.o. female with history of Anginal pain , Aortic atherosclerosis, Cancer , Carotid artery stenosis, CKD stage III ([place]), Coronary artery disease, Diastolic dysfunction, Dysarthria, GER, Hepatic steatosis, HLD (hyperlipidemia), Hypertension, PVD, Subclavian steal syndrome, and T2DM presenting to [facility] with slurred speech.  CT head initially showed an acute left MCA infarct involving and CTA head and neck showed a thrombus in the distal left M1 and proximal M2 segment as well as high-grade stenosis in the left ICA.  She was taken for left ICA angioplasty and stent placement with a left MCA thrombectomy with Dr. Estanislado Pandy with  TICI3 revascularization. ASA and brilinta given post procedure.  She remained intubated postprocedure and was extubated on 4/12.  She will move out of the ICU today and plans to be discharged to CIR at [facility] per family request. ? 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  ?Code Stroke CT head Acute left MCA infarct involving the left temporal lobe and temporoparietal lobe and left basal ganglia.  Aspects 6 ?Repeat Head CT- Evolving left MCA territory infarct involving the left caudate. Diffuse contrast staining within the left temporal region, likely reflecting areas of evolving ischemia as well. No acute intracranial hemorrhage. ?CTA head & neck - Thrombus in the distal Lt M1 and proximal M2 segments ?CT perfusion does not demonstrate infarct in Lt area ?Post IR CT no evidence of intracranial hemorrhage. ?MRI acute MCA infarct involving left caudate, left putamen and left temporal lobe with mild petechial hemorrhage ?2D Echo EF 60-65%, moderate concentric LVH, grade 1 diastolic dysfunction, mildly dilated right atrium, no atrial level shunt ?LDL 20 ?HgbA1c 6.5 ?VTE prophylaxis -lovenox ?   ?Diet  ? Diet Carb Modified Fluid consistency: Thin; Room service appropriate? Yes with Assist  ? ?aspirin 81 mg daily prior to admission, now on aspirin 81 mg daily and Brilinta (ticagrelor) 90 mg bid.  ?Therapy recommendations: CIR ?Disposition: Pending ? ?Hypertension ?Home meds: Amlodipine 5 mg, losartan 100 mg, Coreg 3.125 mg, chlorthalidone 25 mg ?Stable ?Keep SBO <180 ?Long-term BP goal normotensive ? ?Hyperlipidemia ?Home meds: Crestor 20 mg, Zetia 10 mg resumed in hospital ?LDL 20, goal < 70 ?Continue statin at discharge ? ?Diabetes type II Controlled ?Home meds: None ?HgbA1c 6.5, goal < 7.0 ?CBGs ?Recent Labs  ?  11/19/21 ?0325 11/19/21 ?5170 11/19/21 ?1158  ?GLUCAP 96 107* 102*  ? ?  ?SSI ? ?Other Stroke Risk Factors ?Advanced Age >/= 29  ?Obesity, Body mass index is 30.66 kg/m?., BMI >/= 30 associated with increased stroke risk, recommend weight loss, diet and exercise as appropriate  ?Coronary artery disease ?Congestive heart failure ?Isosorbide mononitrate 60 mg ? ?Other Active Problems ?GERD ?Prilosec 20 mg ?CKD Stage III ?Cr 1.07, GFR 52 ?Leukocytosis ?WBC 19.5-> 11.1-> 10.3 ?Daily CBC, monitor fever curve ? ?Hospital day # 3 ? ?Patient seen and examined by NP/APP with MD. MD to update note as needed.   ? ?Wells , MSN, AGACNP-BC ?[facility] ?See Amion for schedule and pager information ?11/19/2021 2:04 PM ? ?STROKE MD NOTE :  ? I have personally obtained history,examined this patient, reviewed notes, independently viewed imaging studies, participated in medical decision making and plan of care.ROS completed by me personally and pertinent positives fully documented  I have made any additions or clarifications directly to the above note. Agree with note above.  Continue aspirin and Brilinta and mobilize out of bed and ongoing therapy consults.  Transfer to neurology floor bed.  Hopefully transfer to inpatient rehab in a few days.  No family available at the bedside today for discussion.This patient is critically ill and at significant risk of neurological worsening, death and care requires constant monitoring of vital signs, hemodynamics,respiratory and cardiac monitoring, extensive review of multiple databases, frequent neurological assessment, discussion with family, other specialists and medical decision making of high complexity.I  have made any additions or clarifications directly to the above note.This critical care time does not reflect procedure time, or teaching time or supervisory time of PA/NP/Med Resident etc but could involve care discussion time. ? I spent 30 minutes of neurocritical care time  in the care of  this patient. ? ?  ? ? ?Antony Contras, MD ?Medical Director ?[facility] ?Pager: [phone] ?11/19/2021 3:02 PM ? ? ?To contact Stroke Continuity provider, please refer to [email or web address] ?After hours, contact General Neurology ? ?

## 2021-11-19 NOTE — Progress Notes (Signed)
? ? ?Referring Physician(s): Dr. Donnetta Simpers ? ?Supervising Physician: Luanne Bras ? ?Patient Status:  [facility] - In-pt ? ?Chief Complaint: ?Code stroke, S/p L MCA thrombectomy with L ICA stent placement by Dr. Rosann Auerbach on 11/16/21.  ? ?Subjective: ?Sitting up in chair.  Moving all extremities.  ?Expressive aphasia.  ?Procedure site stable.  ? ?Allergies: ?Sulfa antibiotics ? ?Medications: ?Prior to Admission medications   ?Medication Sig Start Date End Date Taking? Authorizing Provider  ?acetaminophen (TYLENOL) 325 MG tablet Take 650 mg by mouth every 8 (eight) hours as needed for moderate pain or mild pain (Back).   Yes [provider]  ?amLODipine (NORVASC) 5 MG tablet Take 5 mg by mouth daily.  12/04/14  Yes [provider]  ?aspirin EC 325 MG tablet Take 1 tablet (325 mg total) by mouth daily. ?Patient taking differently: Take 81 mg by mouth daily. 06/11/21  Yes Lattie Corns, PA-C  ?carvedilol (COREG) 3.125 MG tablet Take 3.125 mg by mouth 2 (two) times daily. 10/05/19  Yes [provider]  ?chlorthalidone (HYGROTON) 25 MG tablet Take 12.5 mg by mouth daily. 09/23/14  Yes [provider]  ?docusate sodium (COLACE) 100 MG capsule Take 1 capsule (100 mg total) by mouth 2 (two) times daily. ?Patient taking differently: Take 100 mg by mouth 2 (two) times daily as needed for mild constipation. 06/11/21  Yes Lattie Corns, PA-C  ?escitalopram (LEXAPRO) 10 MG tablet Take 10 mg by mouth daily. 05/13/21  Yes [provider]  ?ezetimibe (ZETIA) 10 MG tablet Take 10 mg by mouth daily. 06/23/18  Yes [provider]  ?isosorbide mononitrate (IMDUR) 60 MG 24 hr tablet Take 60 mg by mouth daily.   Yes [provider]  ?losartan (COZAAR) 100 MG tablet Take 100 mg by mouth daily.    Yes [provider]  ?magnesium chloride (SLOW-MAG) 64 MG TBEC SR tablet Take 1 tablet by mouth daily.   Yes [provider]  ?Multiple Vitamin  (MULTIVITAMIN) capsule Take 1 capsule by mouth daily.   Yes [provider]  ?nitroGLYCERIN (NITROSTAT) 0.4 MG SL tablet Place 0.4 mg under the tongue every 5 (five) minutes as needed for chest pain. 04/13/18 09/24/28 Yes [provider]  ?omeprazole (PRILOSEC) 20 MG capsule Take 20 mg by mouth daily.    Yes [provider]  ?rosuvastatin (CRESTOR) 20 MG tablet Take 20 mg by mouth daily.  12/01/14  Yes [provider]  ? ? ? ?Vital Signs: ?BP (!) 139/51   Pulse 64   Temp 98.4 ?F (36.9 ?C) (Oral)   Resp 17   Wt 173 lb 1 oz (78.5 kg)   SpO2 96%   BMI 30.66 kg/m?  ? ?Physical Exam ?Vitals reviewed.  ?Constitutional:   ?   General: She is not in acute distress. ?   Appearance: Normal appearance. She is not ill-appearing.  ?Cardiovascular:  ?   Rate and Rhythm: Normal rate and regular rhythm.  ?Pulmonary:  ?   Effort: Pulmonary effort is normal.  ?   Comments: Room air ?Skin: ?   General: Skin is warm and dry.  ?   Coloration: Skin is not jaundiced or pale.  ?Neurological:  ?   Mental Status: She is alert.  ?   Comments: NAD, oriented to self and year, thinks she is at [facility]. ?Expressive aphasia-- delay in word-finding, but ultimately correct with time.  ?EOMs intact.  Facial symmetry.  Tongue midline. ?Moving all extremities.  ? ?  ? ? ?  Imaging: ?CT HEAD WO CONTRAST (5MM) ? ?Result Date: 11/17/2021 ?CLINICAL DATA:  Follow-up examination for acute stroke. EXAM: CT HEAD WITHOUT CONTRAST TECHNIQUE: Contiguous axial images were obtained from the base of the skull through the vertex without intravenous contrast. RADIATION DOSE REDUCTION: This exam was performed according to the departmental dose-optimization program which includes automated exposure control, adjustment of the mA and/or kV according to patient size and/or use of iterative reconstruction technique. COMPARISON:  Prior CT and CTA from earlier the same day. FINDINGS: Brain: Age-related cerebral atrophy with chronic  microvascular ischemic disease. Evolving left MCA territory infarct noted involving the left caudate. Diffuse contrast staining seen within the left temporal region, likely areas of evolving ischemia as well. No acute intracranial hemorrhage. No other new large vessel territory infarct. No mass lesion or midline shift. No hydrocephalus or extra-axial fluid collection. Vascular: Residual contrast material seen throughout the intracranial vasculature. Skull: Scalp soft tissues and calvarium demonstrate no new abnormality. Sinuses/Orbits: Globes orbital soft tissues demonstrate no acute finding. Scattered mucosal thickening with air fluid levels noted within the paranasal sinuses. Patient is intubated. Mastoid air cells remain clear. Other: None. IMPRESSION: 1. Evolving left MCA territory infarct involving the left caudate. Diffuse contrast staining within the left temporal region, likely reflecting areas of evolving ischemia as well. No acute intracranial hemorrhage. 2. No other new acute intracranial abnormality. Electronically Signed   By: Jeannine Boga M.D.   On: 11/17/2021 01:31  ? ?MR BRAIN WO CONTRAST ? ?Addendum Date: 11/17/2021   ?ADDENDUM REPORT: 11/17/2021 18:10 ADDENDUM: These results will be called to the ordering clinician or representative by the Radiologist Assistant, and communication documented in the PACS or [employer]. Electronically Signed   By: Franchot Gallo M.D.   On: 11/17/2021 18:10  ? ?Result Date: 11/17/2021 ?CLINICAL DATA:  Stroke post thrombectomy left MCA. EXAM: MRI HEAD WITHOUT CONTRAST TECHNIQUE: Multiplanar, multiecho pulse sequences of the brain and surrounding structures were obtained without intravenous contrast. COMPARISON:  CT head 11/17/2021 FINDINGS: Brain: Acute infarct in the left caudate and left putamen. This is similar to the original CT of 11/16/2021. Additional areas of infarct in the left lateral and posterior temporal lobe also similar to that seen on the  prior CT. These areas show restricted diffusion. Small area of restricted diffusion left occipital lobe compatible with acute infarct. Mild petechial hemorrhage in the left temporal lobe infarct and also in the left putamen infarct. Generalized atrophy. Chronic microvascular ischemic change in the white matter. Vascular: Small caliber left internal carotid artery through the cavernous segment. Normal arterial flow void in the right internal carotid artery and both vertebral arteries. Skull and upper cervical spine: No focal skeletal lesion. Sinuses/Orbits: Mucosal edema paranasal sinuses. Air-fluid level sphenoid sinus and right maxillary sinus. 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 Mild petechial hemorrhage associated with the infarct. Atrophy and chronic microvascular ischemic change. Electronically Signed: By: Franchot Gallo M.D. On: 11/17/2021 17:59  ? ?[place] ? ?Result Date: 11/18/2021 ?INDICATION: New onset of global aphasia. High-grade stenosis/occlusion of the proximal left internal carotid artery, with a near occlusive thrombus in the left middle cerebral artery M1 segment. EXAM: 1. EMERGENT LARGE VESSEL OCCLUSION THROMBOLYSIS (anterior COMPARISON:  CT angiogram of the head and neck of November 16, 2021. MEDICATIONS: Ancef 2 g IV antibiotic was administered within 1 hour of the procedure. ANESTHESIA/SEDATION: General anesthesia. CONTRAST:  Omnipaque 300 approximately 150 mL. FLUOROSCOPY TIME:  Fluoroscopy  Time: 58 minutes 42 seconds (2289 mGy). COMPLICATIONS: None immediate. TECHNIQUE: Following a full explanation of the procedure along with the potential associated complications, an informed witnessed consent was obtained. The risks of intracranial hemorrhage of 10%, worsening neurological deficit, ventilator dependency, death and inability to revascularize were all reviewed in detail with the patient's daughter. The patient was  then put under general anesthesia by the Department of Anesthesiology at [facility]. The right groin was prepped and draped in the usual sterile fashion. Thereafter using modified Seldinger technique, transfemoral access in

## 2021-11-19 NOTE — Progress Notes (Signed)
Occupational Therapy Treatment ?Patient Details ?Name: Cassidy Hernandez ?MRN: [id number] ?DOB: [birth year] ?Today's Date: 11/19/2021 ? ? ?History of present illness 81 y.o. female presents to [facility] hospital on 11/16/2021 with global aphasia. CT  head demonstrates L MCA CVA. Pt underwent revascularization on 11/16/2021. PMH includes PVD, CAD, HTN, HLD, DM2. ?  ?OT comments ? Patient with good progress toward patient focused goals.  Needing Min A for bed mobility, Min A for sit to stand, and Min A for mobility to the toilet with RW.  Patient was [employer] with sit/stand grooming, and Min Guard for toilet hygiene,  OT will continue to follow in the acute setting, and AIR for post acute rehab is recommended.    ? ?Recommendations for follow up therapy are one component of a multi-disciplinary discharge planning process, led by the attending physician.  Recommendations may be updated based on patient status, additional functional criteria and insurance authorization. ?   ?Follow Up Recommendations ? Acute inpatient rehab (3hours/day)  ?  ?Assistance Recommended at Discharge Frequent or constant Supervision/Assistance  ?Patient can return home with the following ? A lot of help with walking and/or transfers;A lot of help with bathing/dressing/bathroom;Assistance with cooking/housework;Direct supervision/assist for medications management;Direct supervision/assist for financial management;Help with stairs or ramp for entrance;Assist for transportation ?  ?Equipment Recommendations ? Other (comment);BSC/3in1;Tub/shower seat  ?  ?Recommendations for Other Services   ? ?  ?Precautions / Restrictions Precautions ?Precautions: Fall ?Restrictions ?Weight Bearing Restrictions: No  ? ? ?  ? ?Mobility Bed Mobility ?Overal bed mobility: Needs Assistance ?Bed Mobility: Sidelying to Sit ?  ?Sidelying to sit: Min assist, HOB elevated ?  ?  ?  ?  ?  ? ?Transfers ?  ?Equipment used: Rolling walker (2 wheels) ?Transfers: Sit to/from Stand, Bed to  chair/wheelchair/BSC ?Sit to Stand: Min assist, From elevated surface ?  ?  ?Step pivot transfers: Min assist ?  ?  ?  ?  ?  ?Balance Overall balance assessment: Needs assistance ?Sitting-balance support: No upper extremity supported, Feet supported ?Sitting balance-Leahy Scale: Fair ?  ?  ?Standing balance support: Reliant on assistive device for balance ?Standing balance-Leahy Scale: Poor ?  ?  ?  ?  ?  ?  ?  ?  ?  ?  ?  ?  ?   ? ?ADL either performed or assessed with clinical judgement  ? ?ADL Overall ADL's : Needs assistance/impaired ?Eating/Feeding: Set up;Sitting ?  ?Grooming: Wash/dry hands;Wash/dry face;Oral care;Min guard;Standing ?  ?  ?  ?  ?  ?  ?  ?  ?  ?Toilet Transfer: Minimal assistance;Regular Toilet;Rolling walker (2 wheels);Ambulation ?  ?Toileting- Water quality scientist and Hygiene: Min guard;Sit to/from stand ?  ?  ?  ?  ?  ?  ? ?Extremity/Trunk Assessment Upper Extremity Assessment ?Upper Extremity Assessment: Overall WFL for tasks assessed ?RUE Deficits / Details: brusing to R shoulder area ?LUE Sensation: WNL ?LUE Coordination: WNL ?  ?Lower Extremity Assessment ?Lower Extremity Assessment: Defer to PT evaluation ?  ?  ?  ? ?   ?  ?  ?   ?  ?   ?  ? ?Cognition Arousal/Alertness: Awake/alert ?  ?Overall Cognitive Status: Difficult to assess ?  ?  ?  ?  ?  ?  ?  ?  ?  ?  ?  ?  ?  ?  ?  ?  ?General Comments: Continues: pt follows one step commands consistently, answers yes/no questions reliably ?  ?  ?   ?   ? ?  ?   ? ? ?  ?    ? ? ?  Pertinent Vitals/ Pain       Pain Assessment ?Faces Pain Scale: Hurts little more ?Pain Location: R calf, RN notified ?Pain Descriptors / Indicators: Tender ?Pain Intervention(s): Monitored during session ? ?   ?  ?  ?  ?  ?  ?  ?  ?  ?  ?  ?  ?  ?  ?  ?  ?  ?  ?  ? ?  ?    ?  ?  ?  ?   ? ?Frequency ? Min 2X/week  ? ? ? ? ?  ?Progress Toward Goals ? ?OT Goals(current goals can now be found in the care plan section) ? Progress towards OT goals: Progressing toward  goals ? ?Acute Rehab OT Goals ?OT Goal Formulation: Patient unable to participate in goal setting ?Time For Goal Achievement: 12/01/21 ?Potential to Achieve Goals: Good  ?Plan Discharge plan remains appropriate   ? ?Co-evaluation ? ? ?   ?  ?  ?  ?  ? ?  ?AM-PAC OT "6 Clicks" Daily Activity     ?Outcome Measure ? ? Help from another person eating meals?: None ?Help from another person taking care of personal grooming?: A Little ?Help from another person toileting, which includes using toliet, bedpan, or urinal?: A Little ?Help from another person bathing (including washing, rinsing, drying)?: A Lot ?Help from another person to put on and taking off regular upper body clothing?: A Little ?Help from another person to put on and taking off regular lower body clothing?: A Lot ?6 Click Score: 17 ? ?  ?End of Session Equipment Utilized During Treatment: Gait belt;Rolling walker (2 wheels) ? ?OT Visit Diagnosis: Unsteadiness on feet (R26.81);Other abnormalities of gait and mobility (R26.89);Muscle weakness (generalized) (M62.81);Apraxia (R48.2);Cognitive communication deficit (R41.841) ?Symptoms and signs involving cognitive functions: Cerebral infarction ?  ?Activity Tolerance Patient tolerated treatment well ?  ?Patient Left in chair;with call bell/phone within reach;with chair alarm set ?  ?Nurse Communication Mobility status ?  ? ?   ? ?Time: 5809-9833 ?OT Time Calculation (min): 31 min ? ?Charges: OT General Charges ?$OT Visit: 1 Visit ?OT Treatments ?$Self Care/Home Management : 23-37 mins ? ?11/19/2021 ? ?RP, OTR/L ? ?Acute Rehabilitation Services ? ?Office:  [phone] ? ? ?Michae Grimley D Farren Landa ?11/19/2021, 9:14 AM ?

## 2021-11-19 NOTE — Progress Notes (Deleted)
STROKE TEAM PROGRESS NOTE  ? ?INTERVAL HISTORY ?Patient is seen in her room  with no family at the bedside.  Her vital signs are stable and she has had no acute events overnight.  Neurological exam is unchanged with continued aphasia.  PT/OT are recommending discharge to CIR. ? ?Vitals:  ? 11/19/21 1021 11/19/21 1100 11/19/21 1200 11/19/21 1300  ?BP: 107/62 (!) 126/40 (!) 136/53 (!) 141/51  ?Pulse: 75 65 76 91  ?Resp: 19 15 (!) 24 (!) 25  ?Temp:   98.5 ?F (36.9 ?C)   ?TempSrc:   Oral   ?SpO2: 97% 99% 100% 97%  ?Weight:      ? ?CBC:  ?Recent Labs  ?Lab 11/16/21 ?1624 11/16/21 ?2341 11/17/21 ?0205 11/18/21 ?1610 11/19/21 ?0425  ?WBC 14.6*  --  19.5* 11.1* 10.3  ?NEUTROABS 11.2*  --  15.9*  --   --   ?HGB 13.3   < > 11.6* 8.4* 8.6*  ?HCT 40.5   < > 34.6* 25.2* 25.9*  ?MCV 87.5  --  88.9 89.7 88.7  ?PLT 267  --  233 192 202  ? < > = values in this interval not displayed.  ? ? ?Basic Metabolic Panel:  ?Recent Labs  ?Lab 11/18/21 ?0313 11/19/21 ?0425  ?NA 137 134*  ?K 3.7 3.4*  ?CL 109 104  ?CO2 21* 22  ?GLUCOSE 97 97  ?BUN 25* 15  ?CREATININE 1.13* 0.99  ?CALCIUM 7.7* 8.0*  ? ? ?Lipid Panel:  ?Recent Labs  ?Lab 11/17/21 ?0205  ?CHOL 99  ?TRIG 159*  ?HDL 47  ?CHOLHDL 2.1  ?VLDL 32  ?[place] 20  ? ? ?HgbA1c:  ?Recent Labs  ?Lab 11/17/21 ?0205  ?HGBA1C 6.5*  ? ? ?Urine Drug Screen: No results for input(s): LABOPIA, COCAINSCRNUR, LABBENZ, AMPHETMU, THCU, LABBARB in the last 168 hours.  ?Alcohol Level No results for input(s): ETH in the last 168 hours. ? ?IMAGING past 24 hours ?No results found. ? ?PHYSICAL EXAM ? ?Physical Exam  ?Constitutional: Appears well-developed and well-nourished pleasant elderly Caucasian lady not in distress.  ?Cardiovascular: Normal rate and regular rhythm.  ?Respiratory: Effort normal, non-labored breathing on supplemental 02 via [place] ? ? ?NEURO:  ?Mental Status: AA&Ox1 ?Speech/Language: receptive and expressive aphasia present.  Patient can speak single words and answer yes/no questions.  Able to  name 1/3 common objects, repetition impaired.  Speech is dysarthric but more understandable today ? ?Cranial Nerves:  ?II: PERRL.  ?III, IV, VI: EOMI. Eyelids elevate symmetrically.  ?VII: Smile is symmetrical.   ?VIII: hearing intact to voice. ?XII: tongue is midline without fasciculations. ?Motor: 5/5 strength to all muscle groups tested.  ?Tone: is normal and bulk is normal ?Sensation- Intact to light touch bilaterally. ?Coordination: No drift.  ?Gait- deferred ? ? ? ?ASSESSMENT/PLAN ?Cassidy Hernandez is a 81 y.o. female with history of Anginal pain , Aortic atherosclerosis, Cancer , Carotid artery stenosis, CKD stage III ([place]), Coronary artery disease, Diastolic dysfunction, Dysarthria, GER, Hepatic steatosis, HLD (hyperlipidemia), Hypertension, PVD, Subclavian steal syndrome, and T2DM presenting to [facility] with slurred speech.  CT head initially showed an acute left MCA infarct involving and CTA head and neck showed a thrombus in the distal left M1 and proximal M2 segment as well as high-grade stenosis in the left ICA.  She was taken for left ICA angioplasty and stent placement with a left MCA thrombectomy with Dr. Estanislado Pandy with TICI3 revascularization. ASA and brilinta given post procedure.  She remained intubated postprocedure and was extubated on  4/12.  She will move out of the ICU today and plans to be discharged to CIR at [facility] per family request. ? 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  ?Code Stroke CT head Acute left MCA infarct involving the left temporal lobe and temporoparietal lobe and left basal ganglia. Aspects 6 ?Repeat Head CT- Evolving left MCA territory infarct involving the left caudate.  Diffuse contrast staining within the left temporal region, likely reflecting areas of evolving ischemia as well. No acute intracranial hemorrhage. ?CTA head & neck - Thrombus in the distal Lt M1 and proximal M2 segments ?CT perfusion does not demonstrate infarct in Lt area ?Post IR CT no evidence of intracranial hemorrhage. ?MRI acute MCA infarct involving left caudate, left putamen and left temporal lobe with mild petechial hemorrhage ?2D Echo EF 60-65%, moderate concentric LVH, grade 1 diastolic dysfunction, mildly dilated right atrium, no atrial level shunt ?LDL 20 ?HgbA1c 6.5 ?VTE prophylaxis -lovenox ?   ?Diet  ? Diet Carb Modified Fluid consistency: Thin; Room service appropriate? Yes with Assist  ? ?aspirin 81 mg daily prior to admission, now on aspirin 81 mg daily and Brilinta (ticagrelor) 90 mg bid.  ?Therapy recommendations: CIR ?Disposition: Pending ? ?Hypertension ?Home meds: Amlodipine 5 mg, losartan 100 mg, Coreg 3.125 mg, chlorthalidone 25 mg ?Stable ?120-140 for 24 hours ?Long-term BP goal normotensive ? ?Hyperlipidemia ?Home meds: Crestor 20 mg, Zetia 10 mg resumed in hospital ?LDL 20, goal < 70 ?Continue statin at discharge ? ?Diabetes type II Controlled ?Home meds: None ?HgbA1c 6.5, goal < 7.0 ?CBGs ?Recent Labs  ?  11/19/21 ?0325 11/19/21 ?9563 11/19/21 ?1158  ?GLUCAP 96 107* 102*  ? ?  ?SSI ? ?Other Stroke Risk Factors ?Advanced Age >/= 59  ?Obesity, Body mass index is 30.66 kg/m?., BMI >/= 30 associated with increased stroke risk, recommend weight loss, diet and exercise as appropriate  ?Coronary artery disease ?Congestive heart failure ?Isosorbide mononitrate 60 mg ? ?Other Active Problems ?GERD ?Prilosec 20 mg ?CKD Stage III ?Cr 1.07, GFR 52 ?Leukocytosis ?WBC 19.5-> 11.1-> 10.3 ?Daily CBC, monitor fever curve ? ?Hospital day # 3 ? ?Patient seen and examined by NP/APP with MD. MD to update note as needed.  ? ?[place] , MSN, AGACNP-BC ?[facility] ?See Amion for  schedule and pager information ?11/19/2021 1:38 PM ?  ? ?To contact Stroke Continuity provider, please refer to [email or web address] ?After hours, contact General Neurology ? ?

## 2021-11-20 DIAGNOSIS — I2510 Atherosclerotic heart disease of native coronary artery without angina pectoris: Secondary | ICD-10-CM

## 2021-11-20 DIAGNOSIS — Z95828 Presence of other vascular implants and grafts: Secondary | ICD-10-CM

## 2021-11-20 DIAGNOSIS — I6602 Occlusion and stenosis of left middle cerebral artery: Secondary | ICD-10-CM | POA: Diagnosis not present

## 2021-11-20 DIAGNOSIS — Z6830 Body mass index (BMI) 30.0-30.9, adult: Secondary | ICD-10-CM

## 2021-11-20 DIAGNOSIS — E1122 Type 2 diabetes mellitus with diabetic chronic kidney disease: Secondary | ICD-10-CM

## 2021-11-20 DIAGNOSIS — D72829 Elevated white blood cell count, unspecified: Secondary | ICD-10-CM

## 2021-11-20 DIAGNOSIS — I6522 Occlusion and stenosis of left carotid artery: Secondary | ICD-10-CM | POA: Diagnosis not present

## 2021-11-20 DIAGNOSIS — I63512 Cerebral infarction due to unspecified occlusion or stenosis of left middle cerebral artery: Secondary | ICD-10-CM | POA: Diagnosis not present

## 2021-11-20 DIAGNOSIS — E669 Obesity, unspecified: Secondary | ICD-10-CM

## 2021-11-20 DIAGNOSIS — N183 Chronic kidney disease, stage 3 unspecified: Secondary | ICD-10-CM

## 2021-11-20 DIAGNOSIS — K219 Gastro-esophageal reflux disease without esophagitis: Secondary | ICD-10-CM

## 2021-11-20 DIAGNOSIS — I509 Heart failure, unspecified: Secondary | ICD-10-CM

## 2021-11-20 DIAGNOSIS — I130 Hypertensive heart and chronic kidney disease with heart failure and stage 1 through stage 4 chronic kidney disease, or unspecified chronic kidney disease: Secondary | ICD-10-CM

## 2021-11-20 DIAGNOSIS — E785 Hyperlipidemia, unspecified: Secondary | ICD-10-CM

## 2021-11-20 LAB — GLUCOSE, CAPILLARY
Glucose-Capillary: 124 mg/dL — ABNORMAL HIGH (ref 70–99)
Glucose-Capillary: 118 mg/dL — ABNORMAL HIGH (ref 70–99)
Glucose-Capillary: 136 mg/dL — ABNORMAL HIGH (ref 70–99)
Glucose-Capillary: 124 mg/dL — ABNORMAL HIGH (ref 70–99)

## 2021-11-20 NOTE — Plan of Care (Signed)
?  Problem: Education: ?Goal: Knowledge of General Education information will improve ?Description: Including pain rating scale, medication(s)/side effects and non-pharmacologic comfort measures ?Outcome: Progressing ?  ?Problem: Health Behavior/Discharge Planning: ?Goal: Ability to manage health-related needs will improve ?Outcome: Progressing ?  ?Problem: Clinical Measurements: ?Goal: Ability to maintain clinical measurements within normal limits will improve ?Outcome: Progressing ?Goal: Will remain free from infection ?Outcome: Progressing ?Goal: Diagnostic test results will improve ?Outcome: Progressing ?Goal: Respiratory complications will improve ?Outcome: Progressing ?Goal: Cardiovascular complication will be avoided ?Outcome: Progressing ?  ?Problem: Activity: ?Goal: Risk for activity intolerance will decrease ?Outcome: Progressing ?  ?Problem: Nutrition: ?Goal: Adequate nutrition will be maintained ?Outcome: Progressing ?  ?Problem: Coping: ?Goal: Level of anxiety will decrease ?Outcome: Progressing ?  ?Problem: Elimination: ?Goal: Will not experience complications related to bowel motility ?Outcome: Progressing ?Goal: Will not experience complications related to urinary retention ?Outcome: Progressing ?  ?Problem: Pain Managment: ?Goal: General experience of comfort will improve ?Outcome: Progressing ?  ?Problem: Safety: ?Goal: Ability to remain free from injury will improve ?Outcome: Progressing ?  ?Problem: Skin Integrity: ?Goal: Risk for impaired skin integrity will decrease ?Outcome: Progressing ?  ?Problem: Education: ?Goal: Knowledge of disease or condition will improve ?Outcome: Progressing ?Goal: Knowledge of secondary prevention will improve (SELECT ALL) ?Outcome: Progressing ?Goal: Knowledge of patient specific risk factors will improve (INDIVIDUALIZE FOR PATIENT) ?Outcome: Progressing ?Goal: Individualized Educational Video(s) ?Outcome: Progressing ?  ?Problem: Coping: ?Goal: Will verbalize  positive feelings about self ?Outcome: Progressing ?Goal: Will identify appropriate support needs ?Outcome: Progressing ?  ?Problem: Self-Care: ?Goal: Ability to participate in self-care as condition permits will improve ?Outcome: Progressing ?Goal: Ability to communicate needs accurately will improve ?Outcome: Progressing ?  ?Problem: Nutrition: ?Goal: Risk of aspiration will decrease ?Outcome: Progressing ?  ?Problem: Ischemic Stroke/TIA Tissue Perfusion: ?Goal: Complications of ischemic stroke/TIA will be minimized ?Outcome: Progressing ?  ?

## 2021-11-20 NOTE — Progress Notes (Addendum)
STROKE TEAM PROGRESS NOTE  ? ?INTERVAL HISTORY ?Patient is seen in her room  with no family at the bedside.  Her vital signs are stable and she has had no acute events overnight.  Neurological exam is unchanged with continued aphasia.  She is able to speak single words and name some objects.  PT/OT recommending discharge to CIR. ? ?Vitals:  ? 11/19/21 1946 11/19/21 2338 11/20/21 0345 11/20/21 0801  ?BP: 129/63 (!) 150/57 (!) 127/57 (!) 145/65  ?Pulse: 85 78 77 80  ?Resp: '16 20 20 20  '$ ?Temp: 98.7 ?F (37.1 ?C) 97.7 ?F (36.5 ?C) 97.7 ?F (36.5 ?C) 97.9 ?F (36.6 ?C)  ?TempSrc: Oral Oral Oral Oral  ?SpO2: 99% 94% 94% 95%  ?Weight:      ? ?CBC:  ?Recent Labs  ?Lab 11/16/21 ?1624 11/16/21 ?2341 11/17/21 ?0205 11/18/21 ?9030 11/19/21 ?0425  ?WBC 14.6*  --  19.5* 11.1* 10.3  ?NEUTROABS 11.2*  --  15.9*  --   --   ?HGB 13.3   < > 11.6* 8.4* 8.6*  ?HCT 40.5   < > 34.6* 25.2* 25.9*  ?MCV 87.5  --  88.9 89.7 88.7  ?PLT 267  --  233 192 202  ? < > = values in this interval not displayed.  ? ? ?Basic Metabolic Panel:  ?Recent Labs  ?Lab 11/18/21 ?0313 11/19/21 ?0425  ?NA 137 134*  ?K 3.7 3.4*  ?CL 109 104  ?CO2 21* 22  ?GLUCOSE 97 97  ?BUN 25* 15  ?CREATININE 1.13* 0.99  ?CALCIUM 7.7* 8.0*  ? ? ?Lipid Panel:  ?Recent Labs  ?Lab 11/17/21 ?0205  ?CHOL 99  ?TRIG 159*  ?HDL 47  ?CHOLHDL 2.1  ?VLDL 32  ?[place] 20  ? ? ?HgbA1c:  ?Recent Labs  ?Lab 11/17/21 ?0205  ?HGBA1C 6.5*  ? ? ?Urine Drug Screen: No results for input(s): LABOPIA, COCAINSCRNUR, LABBENZ, AMPHETMU, THCU, LABBARB in the last 168 hours.  ?Alcohol Level No results for input(s): ETH in the last 168 hours. ? ?IMAGING past 24 hours ?No results found. ? ?PHYSICAL EXAM ? ?Physical Exam  ?Constitutional: Appears well-developed and well-nourished pleasant elderly Caucasian lady not in distress.  ?Cardiovascular: Normal rate and regular rhythm.  ?Respiratory: Effort normal, non-labored breathing on supplemental 02 via [place] ? ? ?NEURO:  ?Mental Status: AA&Ox1 ?Speech/Language:  receptive and expressive aphasia present.  Patient can speak single words and answer yes/no questions.   Naming and repetition impaired.  Speech is dysarthric but more understandable today ? ?Cranial Nerves:  ?II: PERRL.  ?III, IV, VI: EOMI. Eyelids elevate symmetrically.  ?VII: Smile is symmetrical.   ?VIII: hearing intact to voice. ?XII: tongue is midline without fasciculations. ?Motor: 5/5 strength to all muscle groups tested.  ?Tone: is normal and bulk is normal ?Sensation- Intact to light touch bilaterally. ?Coordination: No drift.  ?Gait- deferred ? ? ? ?ASSESSMENT/PLAN ?Ms. JAIMYA FELICIANO is a 81 y.o. female with history of Anginal pain , Aortic atherosclerosis, Cancer , Carotid artery stenosis, CKD stage III ([place]), Coronary artery disease, Diastolic dysfunction, Dysarthria, GER, Hepatic steatosis, HLD (hyperlipidemia), Hypertension, PVD, Subclavian steal syndrome, and T2DM presenting to [facility] with slurred speech.  CT head initially showed an acute left MCA infarct involving and CTA head and neck showed a thrombus in the distal left M1 and proximal M2 segment as well as high-grade stenosis in the left ICA.  She was taken for left ICA angioplasty and stent placement with a left MCA thrombectomy with Dr. Estanislado Pandy with TICI3 revascularization.  ASA and brilinta given post procedure.  She remained intubated postprocedure and was extubated on 4/12.  She will move out of the ICU today and plans to be discharged to CIR at [facility] per family request. ? 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 S/p IR with TICI3 and left ICA stenting ?Code Stroke CT head Acute left MCA infarct involving the left temporal lobe and temporoparietal lobe and left basal ganglia. Aspects 6 ?Repeat Head CT- Evolving left MCA territory infarct involving the left caudate. Diffuse contrast staining within the left temporal region, likely reflecting areas of  evolving ischemia as well. No acute intracranial hemorrhage. ?CTA head & neck - Thrombus in the distal Lt M1 and proximal M2 segments ?CT perfusion does not demonstrate infarct in Lt area ?Post IR CT no evidence of intracranial hemorrhage. ?MRI acute MCA infarct involving left caudate, left putamen and left temporal lobe with mild petechial hemorrhage ?2D Echo EF 60-65%, moderate concentric LVH, grade 1 diastolic dysfunction, mildly dilated right atrium, no atrial level shunt ?LDL 20 ?HgbA1c 6.5 ?VTE prophylaxis -lovenox ?aspirin 81 mg daily prior to admission, now on aspirin 81 mg daily and Brilinta (ticagrelor) 90 mg bid.  ?Therapy recommendations: CIR ?Disposition: Pending ? ?Hypertension ?Home meds: Amlodipine 5 mg, losartan 100 mg, Coreg 3.125 mg, chlorthalidone 25 mg ?Stable ?Keep SBP<180 ?Long-term BP goal normotensive ? ?Hyperlipidemia ?Home meds: Crestor 20 mg, Zetia 10 mg resumed in hospital ?LDL 20, goal < 70 ?Continue statin at discharge ? ?Diabetes type II Controlled ?Home meds: None ?HgbA1c 6.5, goal < 7.0 ?CBGs ?SSI ? ?Other Stroke Risk Factors ?Advanced Age >/= 39  ?Obesity, Body mass index is 30.66 kg/m?., BMI >/= 30 associated with increased stroke risk, recommend weight loss, diet and exercise as appropriate  ?Coronary artery disease ?Congestive heart failure ?Isosorbide mononitrate 60 mg ? ?Other Active Problems ?GERD ?Prilosec 20 mg ?CKD Stage III ?Cr 1.07, GFR 52 ?Leukocytosis ?WBC 19.5-> 11.1-> 10.3 ?Daily CBC, monitor fever curve ? ?Hospital day # 4 ? ?Patient seen and examined by NP/APP with MD. MD to update note as needed.  ? ?[place] , MSN, AGACNP-BC ?[facility] ?See Amion for schedule and pager information ?11/20/2021 1:12 PM ? ?ATTENDING NOTE: ?I reviewed above note and agree with the assessment and plan. Pt was seen and examined.  ? ?81 year old female with history of CAD, HTN, HLD, PVD, CKD 3, diabetes admitted for slurred speech, aphasia and found down at  home.  CT left MCA infarct with aspect score 6.  CT head and neck showed left ICA (bulb and petrous portion) high-grade stenosis, right ICA 50% stenosis.  Distal left M1 and proximal left M2 occlusion.  CTP showed 0/70 if counting Tmax more than 4.0 seconds. Status post IR with TICI3 left MCA and stenting and left ICA.  MRI left MCA patchy infarcts.  EF 60 to 65%.  LDL 20, A1c 6.5, creatinine 0.99.  Hemoglobin 13.3-11.6-8.4-8.6.  WBC 9.5-11.1-10.3. ? ?On exam, patient daughter and son are at bedside, patient awake, alert, eyes open, difficulty with orientation questions, still has expressive aphasia with paraphasic errors and witting with wrong words, however, following all simple commands and able to read. Able to name 2/5 and difficulty with repetition. No gaze palsy, tracking bilaterally, visual field full, PERRL. right facial droop with both upper and lower facial weakness. Tongue midline. RUEs 5-/5, with slight pronator drift. Bilaterally LEs and LUE  5/5, no drift. Sensation symmetrical bilaterally, no sensation neglect. b/l FTN intact, gait not tested.  ? ?Etiology for patient stroke likely due to symptomatic left ICA high-grade stenosis.  Currently on aspirin and Brilinta.  Continue Zetia 10 and Crestor 20.  PT/OT recommend CIR. ? ?For detailed assessment and plan, please refer to above as I have made changes wherever appropriate.  ? ?Rosalin Hawking, MD PhD ?Stroke Neurology ?11/20/2021 ?4:05 PM ? ? ? ?To contact Stroke Continuity provider, please refer to [email or web address] ?After hours, contact General Neurology ? ?

## 2021-11-20 NOTE — Progress Notes (Incomplete Revision)
STROKE TEAM PROGRESS NOTE  ? ?INTERVAL HISTORY ?Patient is seen in her room  with no family at the bedside.  Her vital signs are stable and she has had no acute events overnight.  Neurological exam is unchanged with continued aphasia.  She is able to speak single words and name some objects.  PT/OT recommending discharge to CIR. ? ?Vitals:  ? 11/19/21 1946 11/19/21 2338 11/20/21 0345 11/20/21 0801  ?BP: 129/63 (!) 150/57 (!) 127/57 (!) 145/65  ?Pulse: 85 78 77 80  ?Resp: '16 20 20 20  '$ ?Temp: 98.7 ?F (37.1 ?C) 97.7 ?F (36.5 ?C) 97.7 ?F (36.5 ?C) 97.9 ?F (36.6 ?C)  ?TempSrc: Oral Oral Oral Oral  ?SpO2: 99% 94% 94% 95%  ?Weight:      ? ?CBC:  ?Recent Labs  ?Lab 11/16/21 ?1624 11/16/21 ?2341 11/17/21 ?0205 11/18/21 ?9924 11/19/21 ?0425  ?WBC 14.6*  --  19.5* 11.1* 10.3  ?NEUTROABS 11.2*  --  15.9*  --   --   ?HGB 13.3   < > 11.6* 8.4* 8.6*  ?HCT 40.5   < > 34.6* 25.2* 25.9*  ?MCV 87.5  --  88.9 89.7 88.7  ?PLT 267  --  233 192 202  ? < > = values in this interval not displayed.  ? ? ?Basic Metabolic Panel:  ?Recent Labs  ?Lab 11/18/21 ?0313 11/19/21 ?0425  ?NA 137 134*  ?K 3.7 3.4*  ?CL 109 104  ?CO2 21* 22  ?GLUCOSE 97 97  ?BUN 25* 15  ?CREATININE 1.13* 0.99  ?CALCIUM 7.7* 8.0*  ? ? ?Lipid Panel:  ?Recent Labs  ?Lab 11/17/21 ?0205  ?CHOL 99  ?TRIG 159*  ?HDL 47  ?CHOLHDL 2.1  ?VLDL 32  ?[place] 20  ? ? ?HgbA1c:  ?Recent Labs  ?Lab 11/17/21 ?0205  ?HGBA1C 6.5*  ? ? ?Urine Drug Screen: No results for input(s): LABOPIA, COCAINSCRNUR, LABBENZ, AMPHETMU, THCU, LABBARB in the last 168 hours.  ?Alcohol Level No results for input(s): ETH in the last 168 hours. ? ?IMAGING past 24 hours ?No results found. ? ?PHYSICAL EXAM ? ?Physical Exam  ?Constitutional: Appears well-developed and well-nourished pleasant elderly Caucasian lady not in distress.  ?Cardiovascular: Normal rate and regular rhythm.  ?Respiratory: Effort normal, non-labored breathing on supplemental 02 via [place] ? ? ?NEURO:  ?Mental Status: AA&Ox1 ?Speech/Language:  receptive and expressive aphasia present.  Patient can speak single words and answer yes/no questions.   Naming and repetition impaired.  Speech is dysarthric but more understandable today ? ?Cranial Nerves:  ?II: PERRL.  ?III, IV, VI: EOMI. Eyelids elevate symmetrically.  ?VII: Smile is symmetrical.   ?VIII: hearing intact to voice. ?XII: tongue is midline without fasciculations. ?Motor: 5/5 strength to all muscle groups tested.  ?Tone: is normal and bulk is normal ?Sensation- Intact to light touch bilaterally. ?Coordination: No drift.  ?Gait- deferred ? ? ? ?ASSESSMENT/PLAN ?[name] is a 81 y.o. female with history of Anginal pain , Aortic atherosclerosis, Cancer , Carotid artery stenosis, CKD stage III ([place]), Coronary artery disease, Diastolic dysfunction, Dysarthria, GER, Hepatic steatosis, HLD (hyperlipidemia), Hypertension, PVD, Subclavian steal syndrome, and T2DM presenting to [facility] with slurred speech.  CT head initially showed an acute left MCA infarct involving and CTA head and neck showed a thrombus in the distal left M1 and proximal M2 segment as well as high-grade stenosis in the left ICA.  She was taken for left ICA angioplasty and stent placement with a left MCA thrombectomy with Dr. Estanislado Pandy with TICI3 revascularization.  ASA and brilinta given post procedure.  She remained intubated postprocedure and was extubated on 4/12.  She will move out of the ICU today and plans to be discharged to CIR at [facility] per family request. ? 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  ?Code Stroke CT head Acute left MCA infarct involving the left temporal lobe and temporoparietal lobe and left basal ganglia.  Aspects 6 ?Repeat Head CT- Evolving left MCA territory infarct involving the left caudate. Diffuse contrast staining within the left temporal region, likely reflecting areas of evolving ischemia as well. No acute intracranial hemorrhage. ?CTA head & neck - Thrombus in the distal Lt M1 and proximal M2 segments ?CT perfusion does not demonstrate infarct in Lt area ?Post IR CT no evidence of intracranial hemorrhage. ?MRI acute MCA infarct involving left caudate, left putamen and left temporal lobe with mild petechial hemorrhage ?2D Echo EF 60-65%, moderate concentric LVH, grade 1 diastolic dysfunction, mildly dilated right atrium, no atrial level shunt ?LDL 20 ?HgbA1c 6.5 ?VTE prophylaxis -lovenox ?   ?Diet  ? Diet Carb Modified Fluid consistency: Thin; Room service appropriate? Yes with Assist  ? ?aspirin 81 mg daily prior to admission, now on aspirin 81 mg daily and Brilinta (ticagrelor) 90 mg bid.  ?Therapy recommendations: CIR ?Disposition: Pending ? ?Hypertension ?Home meds: Amlodipine 5 mg, losartan 100 mg, Coreg 3.125 mg, chlorthalidone 25 mg ?Stable ?Keep SBO <180 ?Long-term BP goal normotensive ? ?Hyperlipidemia ?Home meds: Crestor 20 mg, Zetia 10 mg resumed in hospital ?LDL 20, goal < 70 ?Continue statin at discharge ? ?Diabetes type II Controlled ?Home meds: None ?HgbA1c 6.5, goal < 7.0 ?CBGs ?Recent Labs  ?  11/19/21 ?2153 11/20/21 ?0612 11/20/21 ?1130  ?GLUCAP 102* 124* 118*  ? ?  ?SSI ? ?Other Stroke Risk Factors ?Advanced Age >/= 63  ?Obesity, Body mass index is 30.66 kg/m?., BMI >/= 30 associated with increased stroke risk, recommend weight loss, diet and exercise as appropriate  ?Coronary artery disease ?Congestive heart failure ?Isosorbide mononitrate 60 mg ? ?Other Active Problems ?GERD ?Prilosec 20 mg ?CKD Stage III ?Cr 1.07, GFR 52 ?Leukocytosis ?WBC 19.5-> 11.1-> 10.3 ?Daily CBC, monitor fever curve ? ?Hospital day # 4 ? ?Patient seen and examined by NP/APP with MD. MD to update note as needed.   ? ?[place] , MSN, AGACNP-BC ?[facility] ?See Amion for schedule and pager information ?11/20/2021 1:12 PM ? ?ATTENDING NOTE: ?I reviewed above note and agree with the assessment and plan. Pt was seen and examined.  ? ?*** ? ?Neuro - awake, alert, eyes open, difficulty with orientation questions, still has expressive aphasia with paraphasic errors and witting with wrong words, however, following all simple commands and able to read. Able to name 2/5 and difficulty with repetition. No gaze palsy, tracking bilaterally, visual field full, PERRL. right facial droop with both upper and lower facial weakness. Tongue midline. RUEs 5-/5, with slight pronator drift. Bilaterally LEs and LUE 5/5, no drift. Sensation symmetrical bilaterally, no sensation neglect. b/l FTN intact, gait not tested.  ? ? ?For detailed assessment and plan, please refer to above as I have made changes wherever appropriate.  ? ?Rosalin Hawking, MD PhD ?Stroke Neurology ?11/20/2021 ?4:05 PM ? ? ? ?  To contact Stroke Continuity provider, please refer to [email or web address] ?After hours, contact General Neurology ? ?

## 2021-11-21 DIAGNOSIS — I63512 Cerebral infarction due to unspecified occlusion or stenosis of left middle cerebral artery: Secondary | ICD-10-CM | POA: Diagnosis not present

## 2021-11-21 DIAGNOSIS — I6602 Occlusion and stenosis of left middle cerebral artery: Secondary | ICD-10-CM | POA: Diagnosis not present

## 2021-11-21 LAB — BASIC METABOLIC PANEL
Anion gap: 7 (ref 5–15)
BUN: 15 mg/dL (ref 8–23)
CO2: 25 mmol/L (ref 22–32)
Calcium: 8.5 mg/dL — ABNORMAL LOW (ref 8.9–10.3)
Chloride: 103 mmol/L (ref 98–111)
Creatinine, Ser: 1.05 mg/dL — ABNORMAL HIGH (ref 0.44–1.00)
GFR, Estimated: 53 mL/min — ABNORMAL LOW (ref >60)
Glucose, Bld: 106 mg/dL — ABNORMAL HIGH (ref 70–99)
Potassium: 3.5 mmol/L (ref 3.5–5.1)
Sodium: 135 mmol/L (ref 135–145)

## 2021-11-21 LAB — CBC
HCT: 26.8 % — ABNORMAL LOW (ref 36.0–46.0)
Hemoglobin: 9.1 g/dL — ABNORMAL LOW (ref 12.0–15.0)
MCH: 29.9 pg (ref 26.0–34.0)
MCHC: 34 g/dL (ref 30.0–36.0)
MCV: 88.2 fL (ref 80.0–100.0)
Platelets: 256 10*3/uL (ref 150–400)
RBC: 3.04 MIL/uL — ABNORMAL LOW (ref 3.87–5.11)
RDW: 13.9 % (ref 11.5–15.5)
WBC: 8.3 10*3/uL (ref 4.0–10.5)
nRBC: 0 % (ref 0.0–0.2)

## 2021-11-21 LAB — GLUCOSE, CAPILLARY
Glucose-Capillary: 104 mg/dL — ABNORMAL HIGH (ref 70–99)
Glucose-Capillary: 143 mg/dL — ABNORMAL HIGH (ref 70–99)
Glucose-Capillary: 106 mg/dL — ABNORMAL HIGH (ref 70–99)
Glucose-Capillary: 127 mg/dL — ABNORMAL HIGH (ref 70–99)

## 2021-11-21 NOTE — Plan of Care (Signed)
?  Problem: Education: ?Goal: Knowledge of General Education information will improve ?Description: Including pain rating scale, medication(s)/side effects and non-pharmacologic comfort measures ?Outcome: Progressing ?  ?Problem: Health Behavior/Discharge Planning: ?Goal: Ability to manage health-related needs will improve ?Outcome: Progressing ?  ?Problem: Clinical Measurements: ?Goal: Ability to maintain clinical measurements within normal limits will improve ?Outcome: Progressing ?Goal: Will remain free from infection ?Outcome: Progressing ?Goal: Diagnostic test results will improve ?Outcome: Progressing ?Goal: Respiratory complications will improve ?Outcome: Progressing ?Goal: Cardiovascular complication will be avoided ?Outcome: Progressing ?  ?Problem: Activity: ?Goal: Risk for activity intolerance will decrease ?Outcome: Progressing ?  ?Problem: Nutrition: ?Goal: Adequate nutrition will be maintained ?Outcome: Progressing ?  ?Problem: Coping: ?Goal: Level of anxiety will decrease ?Outcome: Progressing ?  ?Problem: Elimination: ?Goal: Will not experience complications related to bowel motility ?Outcome: Progressing ?Goal: Will not experience complications related to urinary retention ?Outcome: Progressing ?  ?Problem: Pain Managment: ?Goal: General experience of comfort will improve ?Outcome: Progressing ?  ?Problem: Safety: ?Goal: Ability to remain free from injury will improve ?Outcome: Progressing ?  ?Problem: Skin Integrity: ?Goal: Risk for impaired skin integrity will decrease ?Outcome: Progressing ?  ?Problem: Education: ?Goal: Knowledge of disease or condition will improve ?Outcome: Progressing ?Goal: Knowledge of secondary prevention will improve (SELECT ALL) ?Outcome: Progressing ?Goal: Knowledge of patient specific risk factors will improve (INDIVIDUALIZE FOR PATIENT) ?Outcome: Progressing ?Goal: Individualized Educational Video(s) ?Outcome: Progressing ?  ?Problem: Coping: ?Goal: Will verbalize  positive feelings about self ?Outcome: Progressing ?Goal: Will identify appropriate support needs ?Outcome: Progressing ?  ?Problem: Self-Care: ?Goal: Ability to participate in self-care as condition permits will improve ?Outcome: Progressing ?Goal: Ability to communicate needs accurately will improve ?Outcome: Progressing ?  ?Problem: Nutrition: ?Goal: Risk of aspiration will decrease ?Outcome: Progressing ?  ?Problem: Ischemic Stroke/TIA Tissue Perfusion: ?Goal: Complications of ischemic stroke/TIA will be minimized ?Outcome: Progressing ?  ?

## 2021-11-21 NOTE — Progress Notes (Addendum)
STROKE TEAM PROGRESS NOTE  ? ?INTERVAL HISTORY ?Patient is seen in her room  with no family at the bedside.  Her vital signs are stable and she has had no acute events overnight.  Neurological exam is unchanged with continued aphasia.  She is able to speak single words and name some objects.  PT/OT recommending discharge to CIR. ? ?Vitals:  ? 11/20/21 1956 11/21/21 0329 11/21/21 0750 11/21/21 1120  ?BP: (!) 149/78 (!) 141/65 (!) 161/61 (!) 146/117  ?Pulse: 85 80 70 83  ?Resp: (!) 22 (!) 21 (!) 22 18  ?Temp: 97.6 ?F (36.4 ?C) 98.2 ?F (36.8 ?C) (!) 97.5 ?F (36.4 ?C) 97.8 ?F (36.6 ?C)  ?TempSrc: Oral Oral Oral Oral  ?SpO2: 94% 93% 94% 96%  ?Weight:      ? ?CBC:  ?Recent Labs  ?Lab 11/16/21 ?1624 11/16/21 ?2341 11/17/21 ?0205 11/18/21 ?2595 11/19/21 ?0425 11/21/21 ?0243  ?WBC 14.6*  --  19.5*   < > 10.3 8.3  ?NEUTROABS 11.2*  --  15.9*  --   --   --   ?HGB 13.3   < > 11.6*   < > 8.6* 9.1*  ?HCT 40.5   < > 34.6*   < > 25.9* 26.8*  ?MCV 87.5  --  88.9   < > 88.7 88.2  ?PLT 267  --  233   < > 202 256  ? < > = values in this interval not displayed.  ? ? ?Basic Metabolic Panel:  ?Recent Labs  ?Lab 11/19/21 ?0425 11/21/21 ?6387  ?NA 134* 135  ?K 3.4* 3.5  ?CL 104 103  ?CO2 22 25  ?GLUCOSE 97 106*  ?BUN 15 15  ?CREATININE 0.99 1.05*  ?CALCIUM 8.0* 8.5*  ? ? ?Lipid Panel:  ?Recent Labs  ?Lab 11/17/21 ?0205  ?CHOL 99  ?TRIG 159*  ?HDL 47  ?CHOLHDL 2.1  ?VLDL 32  ?Felida 20  ? ? ?HgbA1c:  ?Recent Labs  ?Lab 11/17/21 ?0205  ?HGBA1C 6.5*  ? ? ?Urine Drug Screen: No results for input(s): LABOPIA, COCAINSCRNUR, LABBENZ, AMPHETMU, THCU, LABBARB in the last 168 hours.  ?Alcohol Level No results for input(s): ETH in the last 168 hours. ? ?IMAGING past 24 hours ?No results found. ? ?PHYSICAL EXAM ? ?Physical Exam  ?Constitutional: Appears well-developed and well-nourished pleasant elderly Caucasian lady not in distress.  ?Cardiovascular: Normal rate and regular rhythm.  ?Respiratory: Effort normal, non-labored breathing on room  air ? ? ?NEURO:  ?Mental Status: AA&Ox1 ?Speech/Language: receptive and expressive aphasia present.  Patient can speak short phrases and sentences and answer yes/no questions.   Naming and repetition impaired.  Speech is less dysarthric today ? ?Cranial Nerves:  ?II: PERRL.  ?III, IV, VI: EOMI. Eyelids elevate symmetrically.  ?VII: Smile is symmetrical.   ?VIII: hearing intact to voice. ?XII: tongue is midline without fasciculations. ?Motor: 5/5 strength to all muscle groups tested.  ?Tone: is normal and bulk is normal ?Sensation- Intact to light touch bilaterally. ?Coordination: No drift.  ?Gait- deferred ? ? ? ?ASSESSMENT/PLAN ?[name] is a 81 y.o. female with history of Anginal pain , Aortic atherosclerosis, Cancer , Carotid artery stenosis, CKD stage III ([place]), Coronary artery disease, Diastolic dysfunction, Dysarthria, GER, Hepatic steatosis, HLD (hyperlipidemia), Hypertension, PVD, Subclavian steal syndrome, and T2DM presenting to [facility] with slurred speech.  CT head initially showed an acute left MCA infarct involving and CTA head and neck showed a thrombus in the distal left M1 and proximal M2 segment as well as  high-grade stenosis in the left ICA.  She was taken for left ICA angioplasty and stent placement with a left MCA thrombectomy with Dr. Estanislado Pandy with TICI3 revascularization. ASA and brilinta given post procedure.  She remained intubated postprocedure and was extubated on 4/12.  She will move out of the ICU today and plans to be discharged to CIR at [facility] per family request. ? 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 S/p IR with TICI3 and left ICA stenting ?Code Stroke CT head Acute left MCA infarct involving the left temporal lobe and temporoparietal lobe and left basal ganglia. Aspects 6 ?Repeat Head CT- Evolving left MCA territory infarct involving the left caudate. Diffuse contrast staining  within the left temporal region, likely reflecting areas of evolving ischemia as well. No acute intracranial hemorrhage. ?CTA head & neck - Thrombus in the distal Lt M1 and proximal M2 segments ?CT perfusion does not demonstrate infarct in Lt area ?Post IR CT no evidence of intracranial hemorrhage. ?MRI acute MCA infarct involving left caudate, left putamen and left temporal lobe with mild petechial hemorrhage ?2D Echo EF 60-65%, moderate concentric LVH, grade 1 diastolic dysfunction, mildly dilated right atrium, no atrial level shunt ?LDL 20 ?HgbA1c 6.5 ?VTE prophylaxis -lovenox ?aspirin 81 mg daily prior to admission, now on aspirin 81 mg daily and Brilinta (ticagrelor) 90 mg bid.  ?Therapy recommendations: CIR ?Disposition: Pending ? ?Hypertension ?Home meds: Amlodipine 5 mg, losartan 100 mg, Coreg 3.125 mg, chlorthalidone 25 mg ?Stable ?Keep SBP<180 ?Long-term BP goal normotensive ? ?Hyperlipidemia ?Home meds: Crestor 20 mg, Zetia 10 mg resumed in hospital ?LDL 20, goal < 70 ?Continue statin at discharge ? ?Diabetes type II Controlled ?Home meds: None ?HgbA1c 6.5, goal < 7.0 ?CBGs ?SSI ? ?Other Stroke Risk Factors ?Advanced Age >/= 16  ?Obesity, Body mass index is 30.66 kg/m?., BMI >/= 30 associated with increased stroke risk, recommend weight loss, diet and exercise as appropriate  ?Coronary artery disease ?Congestive heart failure ?Isosorbide mononitrate 60 mg ? ?Other Active Problems ?GERD ?Prilosec 20 mg ?CKD Stage III ?Cr 1.07, GFR 52 ?Leukocytosis ?WBC 19.5-> 11.1-> 10.3-> 8.3-> resolved ?Daily CBC, monitor fever curve ? ?Hospital day # 5 ? ?Patient seen and examined by NP/APP with MD. MD to update note as needed.  ? ?Kite , MSN, AGACNP-BC ?[facility] ?See Amion for schedule and pager information ?11/21/2021 12:05 PM ? ?ATTENDING NOTE: ?I reviewed above note and agree with the assessment and plan. Pt was seen and examined.  ? ?No acute event overnight.  Patient neuro stable,  unchanged.  Still has mild expressive aphasia but moving all extremities.  Pending CIR admission to [facility] inpatient rehab.  Continue aspirin and Brilinta as well as statin ? ?For detailed assessment and plan, please refer to above as I have made changes wherever appropriate.  ? ?Rosalin Hawking, MD PhD ?Stroke Neurology ?11/21/2021 ?4:50 PM ? ? ? ?To contact Stroke Continuity provider, please refer to [email or web address] ?After hours, contact General Neurology ? ?

## 2021-11-21 NOTE — Progress Notes (Signed)
Physical Therapy Treatment ?Patient Details ?Name: Cassidy Hernandez ?MRN: [id number] ?DOB: 1941/04/10 ?Today's Date: 11/21/2021 ? ? ?History of Present Illness 81 y.o. female presents to [facility] hospital on 11/16/2021 with global aphasia. CT  head demonstrates L MCA CVA. Pt underwent revascularization on 11/16/2021. PMH includes PVD, CAD, HTN, HLD, DM2. ? ?  ?PT Comments  ? ? Pt is progressing well toward meeting goals, limited by paretic gait and expressive difficulties.  Emphasis on safe transitions with minimal assist, sit to stand and progression of gait stability/quality and stamina. ?   ?Recommendations for follow up therapy are one component of a multi-disciplinary discharge planning process, led by the attending physician.  Recommendations may be updated based on patient status, additional functional criteria and insurance authorization. ? ?Follow Up Recommendations ? Acute inpatient rehab (3hours/day) ?  ?  ?Assistance Recommended at Discharge Intermittent Supervision/Assistance  ?Patient can return home with the following A little help with walking and/or transfers;A little help with bathing/dressing/bathroom;Help with stairs or ramp for entrance;Direct supervision/assist for medications management;Direct supervision/assist for financial management;Assist for transportation ?  ?Equipment Recommendations ? Other (comment) (TBD)  ?  ?Recommendations for Other Services Rehab consult ? ? ?  ?Precautions / Restrictions Precautions ?Precautions: Fall  ?  ? ?Mobility ? Bed Mobility ?Overal bed mobility: Needs Assistance ?Bed Mobility: Supine to Sit, Sit to Supine ?  ?  ?Supine to sit: Min assist ?Sit to supine: Min guard ?  ?General bed mobility comments: needed extra time and stability assist while she came up and forward.  Into bed without assist, but min assist to repositions. ?  ? ?Transfers ?Overall transfer level: Needs assistance ?Equipment used: Rolling walker (2 wheels) ?Transfers: Sit to/from Stand ?Sit to Stand:  Min guard ?  ?  ?  ?  ?  ?General transfer comment: cues for hand placement ?  ? ?Ambulation/Gait ?Ambulation/Gait assistance: Min guard, Min assist ?Gait Distance (Feet): 140 Feet (x2 with brief rest to regroup) ?Assistive device: Rolling walker (2 wheels) ?Gait Pattern/deviations: Step-through pattern ?Gait velocity: reduced ?Gait velocity interpretation: <1.8 ft/sec, indicate of risk for recurrent falls ?  ?General Gait Details: worked on improving heel/toe pattern on the R>L LE ? ? ?Stairs ?  ?  ?  ?  ?  ? ? ?Wheelchair Mobility ?  ? ?Modified Rankin (Stroke Patients Only) ?Modified Rankin (Stroke Patients Only) ?Pre-Morbid Rankin Score: Slight disability ?Modified Rankin: Moderately severe disability ? ? ?  ?Balance Overall balance assessment: Needs assistance ?  ?Sitting balance-Leahy Scale: Fair ?  ?  ?  ?Standing balance-Leahy Scale: Poor ?Standing balance comment: reliant on AD or external assist ?  ?  ?  ?  ?  ?  ?  ?  ?  ?  ?  ?  ? ?  ?Cognition Arousal/Alertness: Awake/alert ?Behavior During Therapy: Flat affect ?Overall Cognitive Status: No family/caregiver present to determine baseline cognitive functioning ?  ?  ?  ?  ?  ?  ?  ?  ?  ?  ?  ?  ?  ?  ?  ?  ?  ?  ?  ? ?  ?Exercises   ? ?  ?General Comments   ?  ?  ? ?Pertinent Vitals/Pain Pain Assessment ?Pain Assessment: Faces ?Faces Pain Scale: Hurts a little bit ?Pain Location: thoat, general ?Pain Descriptors / Indicators: Sore ?Pain Intervention(s): Monitored during session  ? ? ?Home Living   ?  ?  ?  ?  ?  ?  ?  ?  ?  ?   ?  ?  Prior Function    ?  ?  ?   ? ?PT Goals (current goals can now be found in the care plan section) Acute Rehab PT Goals ?PT Goal Formulation: Patient unable to participate in goal setting ?Time For Goal Achievement: 12/01/21 ?Potential to Achieve Goals: Fair ?Progress towards PT goals: Progressing toward goals ? ?  ?Frequency ? ? ? Min 4X/week ? ? ? ?  ?PT Plan Current plan remains appropriate  ? ? ?Co-evaluation   ?  ?  ?  ?   ? ?  ?AM-PAC PT "6 Clicks" Mobility   ?Outcome Measure ? Help needed turning from your back to your side while in a flat bed without using bedrails?: A Little ?Help needed moving from lying on your back to sitting on the side of a flat bed without using bedrails?: A Little ?Help needed moving to and from a bed to a chair (including a wheelchair)?: A Little ?Help needed standing up from a chair using your arms (e.g., wheelchair or bedside chair)?: A Little ?Help needed to walk in hospital room?: A Little ?Help needed climbing 3-5 steps with a railing? : A Lot ?6 Click Score: 17 ? ?  ?End of Session   ?Activity Tolerance: Patient tolerated treatment well ?Patient left: in bed;with call bell/phone within reach;with bed alarm set;with family/visitor present ?Nurse Communication: Mobility status ?PT Visit Diagnosis: Other abnormalities of gait and mobility (R26.89);Muscle weakness (generalized) (M62.81);Other symptoms and signs involving the nervous system (R29.898) ?  ? ? ?Time: 0630-1601 ?PT Time Calculation (min) (ACUTE ONLY): 17 min ? ?Charges:  $Gait Training: 8-22 mins          ?          ? ?11/21/2021 ? ?Ginger Carne., PT ?Acute Rehabilitation Services ?[phone]  (pager) ?[phone]  (office) ? ? ?Tessie Fass Ryanne Morand ?11/21/2021, 6:48 PM ? ?

## 2021-11-21 NOTE — Plan of Care (Signed)
?  Problem: Education: ?Goal: Knowledge of General Education information will improve ?Description: Including pain rating scale, medication(s)/side effects and non-pharmacologic comfort measures ?Outcome: Progressing ?  ?Problem: Activity: ?Goal: Risk for activity intolerance will decrease ?Outcome: Progressing ?  ?Problem: Nutrition: ?Goal: Adequate nutrition will be maintained ?Outcome: Progressing ?  ?Problem: Coping: ?Goal: Level of anxiety will decrease ?Outcome: Progressing ?  ?Problem: Safety: ?Goal: Ability to remain free from injury will improve ?Outcome: Progressing ?  ?Problem: Skin Integrity: ?Goal: Risk for impaired skin integrity will decrease ?Outcome: Progressing ?  ?Problem: Ischemic Stroke/TIA Tissue Perfusion: ?Goal: Complications of ischemic stroke/TIA will be minimized ?Outcome: Progressing ?  ?

## 2021-11-22 DIAGNOSIS — I63512 Cerebral infarction due to unspecified occlusion or stenosis of left middle cerebral artery: Secondary | ICD-10-CM | POA: Diagnosis not present

## 2021-11-22 LAB — GLUCOSE, CAPILLARY
Glucose-Capillary: 115 mg/dL — ABNORMAL HIGH (ref 70–99)
Glucose-Capillary: 103 mg/dL — ABNORMAL HIGH (ref 70–99)
Glucose-Capillary: 109 mg/dL — ABNORMAL HIGH (ref 70–99)
Glucose-Capillary: 133 mg/dL — ABNORMAL HIGH (ref 70–99)
Glucose-Capillary: 184 mg/dL — ABNORMAL HIGH (ref 70–99)

## 2021-11-22 NOTE — Progress Notes (Signed)
Occupational Therapy Treatment ?Patient Details ?Name: Cassidy Hernandez ?MRN: [id number] ?DOB: 1941/01/31 ?Today's Date: 11/22/2021 ? ? ?History of present illness 81 y.o. female presents to [facility] hospital on 11/16/2021 with global aphasia. CT  head demonstrates L MCA CVA. Pt underwent revascularization on 11/16/2021. PMH includes PVD, CAD, HTN, HLD, DM2. ?  ?OT comments ? Patient received in bed and agreeable to OT treatment. Patient required increased time for transfers and mobility and verbal cues for hand placement to stand. Patient able to stand at sink to perform grooming tasks without verbal cues for sequencing.  Patient making good gains with OT treatment and would benefit from further OT services to increase independence and safety with self care.   ? ?Recommendations for follow up therapy are one component of a multi-disciplinary discharge planning process, led by the attending physician.  Recommendations may be updated based on patient status, additional functional criteria and insurance authorization. ?   ?Follow Up Recommendations ? Acute inpatient rehab (3hours/day)  ?  ?Assistance Recommended at Discharge Frequent or constant Supervision/Assistance  ?Patient can return home with the following ? A lot of help with bathing/dressing/bathroom;Assistance with cooking/housework;Direct supervision/assist for medications management;Direct supervision/assist for financial management;Help with stairs or ramp for entrance;Assist for transportation;A little help with walking and/or transfers ?  ?Equipment Recommendations ? Other (comment);BSC/3in1;Tub/shower seat  ?  ?Recommendations for Other Services   ? ?  ?Precautions / Restrictions Precautions ?Precautions: Fall ?Restrictions ?Weight Bearing Restrictions: No  ? ? ?  ? ?Mobility Bed Mobility ?Overal bed mobility: Needs Assistance ?Bed Mobility: Supine to Sit ?  ?  ?Supine to sit: Min assist ?  ?  ?General bed mobility comments: increased time and assistance with raising  trunk ?  ? ?Transfers ?Overall transfer level: Needs assistance ?Equipment used: Rolling walker (2 wheels) ?Transfers: Sit to/from Stand ?Sit to Stand: Min guard ?  ?  ?Step pivot transfers: Min guard ?  ?  ?General transfer comment: verbal cues for hand placement to power up ?  ?  ?Balance Overall balance assessment: Needs assistance ?Sitting-balance support: No upper extremity supported, Feet supported ?Sitting balance-Leahy Scale: Fair ?  ?  ?Standing balance support: Single extremity supported, During functional activity ?Standing balance-Leahy Scale: Poor ?Standing balance comment: reliant on external device for standing balance ?  ?  ?  ?  ?  ?  ?  ?  ?  ?  ?  ?   ? ?ADL either performed or assessed with clinical judgement  ? ?ADL Overall ADL's : Needs assistance/impaired ?  ?  ?Grooming: Wash/dry hands;Wash/dry face;Oral care;Min guard;Standing ?Grooming Details (indicate cue type and reason): at sink ?  ?  ?  ?  ?  ?  ?  ?  ?Toilet Transfer: Financial planner (2 wheels) ?Toilet Transfer Details (indicate cue type and reason): verbal cues for safety ?Toileting- Clothing Manipulation and Hygiene: Supervision/safety;Sitting/lateral lean ?  ?  ?  ?  ?General ADL Comments: increased time with mobility and transfers ?  ? ?Extremity/Trunk Assessment Upper Extremity Assessment ?RUE Deficits / Details: brusing to R shoulder area ?LUE Deficits / Details: ROM seems WFL. generally weak. poor coordintion but also difficulty following commands to assess. impaired sensation? ?LUE Sensation: WNL ?LUE Coordination: WNL ?  ?  ?  ?  ?  ? ?Vision   ?  ?  ?Perception   ?  ?Praxis   ?  ? ?Cognition Arousal/Alertness: Awake/alert ?Behavior During Therapy: Flat affect ?Overall Cognitive Status: No family/caregiver present to determine  baseline cognitive functioning ?  ?  ?  ?  ?  ?  ?  ?  ?  ?  ?  ?  ?  ?  ?  ?  ?General Comments: difficulty wit word finding, able to answer most questions ?  ?  ?   ?Exercises    ? ?  ?Shoulder Instructions   ? ? ?  ?General Comments    ? ? ?Pertinent Vitals/ Pain       Pain Assessment ?Pain Assessment: Faces ?Faces Pain Scale: No hurt ?Pain Intervention(s): Monitored during session ? ?Home Living   ?  ?  ?  ?  ?  ?  ?  ?  ?  ?  ?  ?  ?  ?  ?  ?  ?  ?  ? ?  ?Prior Functioning/Environment    ?  ?  ?  ?   ? ?Frequency ? Min 2X/week  ? ? ? ? ?  ?Progress Toward Goals ? ?OT Goals(current goals can now be found in the care plan section) ? Progress towards OT goals: Progressing toward goals ? ?Acute Rehab OT Goals ?OT Goal Formulation: Patient unable to participate in goal setting ?Time For Goal Achievement: 12/01/21 ?Potential to Achieve Goals: Good ?ADL Goals ?Pt Will Perform Grooming: with modified independence;standing ?Pt Will Perform Lower Body Dressing: with min guard assist;sit to/from stand ?Pt Will Transfer to Toilet: with supervision;ambulating ?Additional ADL Goal #1: Pt will demonstrate increased activity tolerance to complete at least 3 ADLs in standing with supervision A ?Additional ADL Goal #2: Pt will independently follow 3 step directional task as a precursor to IADLs  ?Plan Discharge plan remains appropriate   ? ?Co-evaluation ? ? ?   ?  ?  ?  ?  ? ?  ?AM-PAC OT "6 Clicks" Daily Activity     ?Outcome Measure ? ? Help from another person eating meals?: None ?Help from another person taking care of personal grooming?: A Little ?Help from another person toileting, which includes using toliet, bedpan, or urinal?: A Little ?Help from another person bathing (including washing, rinsing, drying)?: A Lot ?Help from another person to put on and taking off regular upper body clothing?: A Little ?Help from another person to put on and taking off regular lower body clothing?: A Lot ?6 Click Score: 17 ? ?  ?End of Session Equipment Utilized During Treatment: Gait belt;Rolling walker (2 wheels) ? ?OT Visit Diagnosis: Unsteadiness on feet (R26.81);Other abnormalities of gait and mobility  (R26.89);Muscle weakness (generalized) (M62.81);Apraxia (R48.2);Cognitive communication deficit (R41.841) ?Symptoms and signs involving cognitive functions: Cerebral infarction ?  ?Activity Tolerance Patient tolerated treatment well ?  ?Patient Left in chair;with call bell/phone within reach;with chair alarm set ?  ?Nurse Communication Mobility status ?  ? ?   ? ?Time: 5009-3818 ?OT Time Calculation (min): 20 min ? ?Charges: OT General Charges ?$OT Visit: 1 Visit ?OT Treatments ?$Self Care/Home Management : 8-22 mins ? ?Lodema Hong, OTA ?Acute Rehabilitation Services  ?Pager [phone] ?Office [phone] ? ? ?[place] ?11/22/2021, 9:39 AM ?

## 2021-11-22 NOTE — Progress Notes (Addendum)
STROKE TEAM PROGRESS NOTE  ? ?INTERVAL HISTORY ? ?Patient is seen in her room  with no family at the bedside.  Her vital signs are stable and she has had no acute events overnight.  Neurological exam is unchanged with continued aphasia.  She is able to speak single words and name some objects.  PT/OT recommending discharge to CIR. Awaiting CIR placement with [facility] ? ? ?Vitals:  ? 11/21/21 1120 11/21/21 1537 11/21/21 2100 11/22/21 0818  ?BP: (!) 146/117 (!) 156/58 128/63 119/70  ?Pulse: 83 78  77  ?Resp: '18 17  16  '$ ?Temp: 97.8 ?F (36.6 ?C) 97.9 ?F (36.6 ?C) 97.7 ?F (36.5 ?C) 98.6 ?F (37 ?C)  ?TempSrc: Oral Oral Oral Oral  ?SpO2: 96% 95%  99%  ?Weight:      ? ?CBC:  ?Recent Labs  ?Lab 11/16/21 ?1624 11/16/21 ?2341 11/17/21 ?0205 11/18/21 ?4076 11/19/21 ?0425 11/21/21 ?0243  ?WBC 14.6*  --  19.5*   < > 10.3 8.3  ?NEUTROABS 11.2*  --  15.9*  --   --   --   ?HGB 13.3   < > 11.6*   < > 8.6* 9.1*  ?HCT 40.5   < > 34.6*   < > 25.9* 26.8*  ?MCV 87.5  --  88.9   < > 88.7 88.2  ?PLT 267  --  233   < > 202 256  ? < > = values in this interval not displayed.  ? ? ?Basic Metabolic Panel:  ?Recent Labs  ?Lab 11/19/21 ?0425 11/21/21 ?8088  ?NA 134* 135  ?K 3.4* 3.5  ?CL 104 103  ?CO2 22 25  ?GLUCOSE 97 106*  ?BUN 15 15  ?CREATININE 0.99 1.05*  ?CALCIUM 8.0* 8.5*  ? ? ?Lipid Panel:  ?Recent Labs  ?Lab 11/17/21 ?0205  ?CHOL 99  ?TRIG 159*  ?HDL 47  ?CHOLHDL 2.1  ?VLDL 32  ?[place] 20  ? ? ?HgbA1c:  ?Recent Labs  ?Lab 11/17/21 ?0205  ?HGBA1C 6.5*  ? ? ?Urine Drug Screen: No results for input(s): LABOPIA, COCAINSCRNUR, LABBENZ, AMPHETMU, THCU, LABBARB in the last 168 hours.  ?Alcohol Level No results for input(s): ETH in the last 168 hours. ? ?IMAGING past 24 hours ?No results found. ? ?PHYSICAL EXAM ? ?Physical Exam  ?Constitutional: Appears well-developed and well-nourished pleasant elderly Caucasian lady not in distress.  ?Cardiovascular: Normal rate and regular rhythm.  ?Respiratory: Effort normal, non-labored breathing on room  air ? ? ?NEURO:  ?Mental Status: AA&Ox1 ?Speech/Language: receptive and expressive aphasia present.  Patient can speak short phrases and sentences and answer yes/no questions.   Naming and repetition impaired.  Speech is less dysarthric today ? ?Cranial Nerves:  ?II: PERRL.  ?III, IV, VI: EOMI. Eyelids elevate symmetrically.  ?VII: Smile is symmetrical.   ?VIII: hearing intact to voice. ?XII: tongue is midline without fasciculations. ?Motor: 5/5 strength to all muscle groups tested.  ?Tone: is normal and bulk is normal ?Sensation- Intact to light touch bilaterally. ?Coordination: No drift.  ?Gait- deferred ? ? ? ?ASSESSMENT/PLAN ?[name] is a 81 y.o. female with history of Anginal pain , Aortic atherosclerosis, Cancer , Carotid artery stenosis, CKD stage III ([place]), Coronary artery disease, Diastolic dysfunction, Dysarthria, GER, Hepatic steatosis, HLD (hyperlipidemia), Hypertension, PVD, Subclavian steal syndrome, and T2DM presenting to [facility] with slurred speech.  CT head initially showed an acute left MCA infarct involving and CTA head and neck showed a thrombus in the distal left M1 and proximal M2 segment as well  as high-grade stenosis in the left ICA.  She was taken for left ICA angioplasty and stent placement with a left MCA thrombectomy with Dr. Estanislado Pandy with TICI3 revascularization. ASA and brilinta given post procedure.  She remained intubated postprocedure and was extubated on 4/12.  Plan to discharge to CIR at [facility] per family request. ? 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 S/p IR with TICI3 and left ICA stenting ?Code Stroke CT head Acute left MCA infarct involving the left temporal lobe and temporoparietal lobe and left basal ganglia. Aspects 6 ?Repeat Head CT- Evolving left MCA territory infarct involving the left caudate. Diffuse contrast staining within the left temporal region, likely  reflecting areas of evolving ischemia as well. No acute intracranial hemorrhage. ?CTA head & neck - Thrombus in the distal Lt M1 and proximal M2 segments ?CT perfusion does not demonstrate infarct in Lt area ?Post IR CT no evidence of intracranial hemorrhage. ?MRI acute MCA infarct involving left caudate, left putamen and left temporal lobe with mild petechial hemorrhage ?2D Echo EF 60-65%, moderate concentric LVH, grade 1 diastolic dysfunction, mildly dilated right atrium, no atrial level shunt ?LDL 20 ?HgbA1c 6.5 ?VTE prophylaxis -lovenox ?aspirin 81 mg daily prior to admission, now on aspirin 81 mg daily and Brilinta (ticagrelor) 90 mg bid.  ?Therapy recommendations: CIR ?Disposition: Pending ? ?Hypertension ?Home meds: Amlodipine 5 mg, losartan 100 mg, Coreg 3.125 mg, chlorthalidone 25 mg ?Stable ?Keep SBP<180 ?Long-term BP goal normotensive ? ?Hyperlipidemia ?Home meds: Crestor 20 mg, Zetia 10 mg resumed in hospital ?LDL 20, goal < 70 ?Continue statin at discharge ? ?Diabetes type II Controlled ?Home meds: None ?HgbA1c 6.5, goal < 7.0 ?CBGs ?SSI ? ?Other Stroke Risk Factors ?Advanced Age >/= 71  ?Obesity, Body mass index is 30.66 kg/m?., BMI >/= 30 associated with increased stroke risk, recommend weight loss, diet and exercise as appropriate  ?Coronary artery disease ?Congestive heart failure ?Isosorbide mononitrate 60 mg ? ?Other Active Problems ?GERD ?Prilosec 20 mg ?CKD Stage III ?Cr 1.07, GFR 52 ?Leukocytosis ?WBC 19.5-> 11.1-> 10.3-> 8.3-> resolved ?Daily CBC, monitor fever curve ? ?Hospital day # 6 ? ?Patient seen and examined by NP/APP with MD. MD to update note as needed.  ? ?Janine Ores, DNP, FNP-BC ?[facility] ?Pager: [phone] ? ?ATTENDING NOTE: ?I reviewed above note and agree with the assessment and plan. Pt was seen and examined.  ? ?No family at bedside.  Patient lying bed, no acute event overnight, neuro stable, unchanged.  Still has mild expressive aphasia.  Social worker  still working on [employer] placement. ? ?For detailed assessment and plan, please refer to above as I have made changes wherever appropriate.  ? ?Rosalin Hawking, MD PhD ?Stroke Neurology ?11/22/2021 ?6:57 PM ? ? ? ? ?To contact Stroke Continuity provider, please refer to [email or web address] ?After hours, contact General Neurology ? ?

## 2021-11-22 NOTE — Progress Notes (Signed)
Speech Language Pathology Treatment: Cognitive-Linquistic (Aphasia)  ?Patient Details ?Name: Cassidy Hernandez ?MRN: [id number] ?DOB: Nov 03, 1940 ?Today's Date: 11/22/2021 ?Time: 1549-1610 ?SLP Time Calculation (min) (ACUTE ONLY): 21 min ? ?Assessment / Plan / Recommendation ?Clinical Impression ? Pt was seen for aphasia treatment with her sister present. Pt's verbal fluency was improved, but word retrieval difficulty and paraphasic errors were still noted. Articulatory precision remains reduced, but pt's sister reported that this is at baseline since a prior TIA. Pt's sister was educated regarding the nature of aphasia, strategies to improve auditory comprehension, facilitate communication with the pt, and to reduce the pt's frustration. She verbalized understanding as well as agreement and was noted to use some of the strategies during the session. Pt was educated regarding compensatory strategies for word retrieval, but repetition and rephrasing was necessary to improve comprehension. Verbal reading continues to be a strength and pt was educated on use of a communication board to augment communication. She verbalized understanding and she was able to identify some items on the board during simulated scenarios. Pt demonstrated 33% accuracy with responsive naming given verbal prompts, orthographic cues, and part-word cues. She achieved 50% accuracy with confrontational naming increasing to 75% given part-word cues. Pt participated in simple conversation with the SLP and her sister, and pt used compensatory strategies for word retrieval with cues. SLP will continue to follow pt.   ?  ?HPI HPI: Pt is an 81 y.o. female who presented to the ED on 11/16/2021 with aphasia and right hemiparesis. Pt found to have distal left M1 proximal M2 high-grade near occlusive stenosis and underwent emergent mechanical thrombectomy 4/11 requiring left MCA rescue angioplasty as well as proximal left ICA angioplasty and vascular stenting for  high-grade stenosis. MRI brain 4/12: Acute left MCA infarct involving the left caudate, left putamen, left temporal lobe. Mild petechial hemorrhage associated with the infarct. PMH: PVD, CAD, HTN, HLD, DM2. ?  ?   ?SLP Plan ? Continue with current plan of care ? ?  ?  ?Recommendations for follow up therapy are one component of a multi-disciplinary discharge planning process, led by the attending physician.  Recommendations may be updated based on patient status, additional functional criteria and insurance authorization. ?  ? ?Recommendations  ?   ?   ?    ?   ? ? ? ? Follow Up Recommendations: Acute inpatient rehab (3hours/day) ?Assistance recommended at discharge: Frequent or constant Supervision/Assistance ?SLP Visit Diagnosis: Aphasia (R47.01);Dysarthria and anarthria (R47.1) ?Plan: Continue with current plan of care ? ? ? ? ?  ?  ? ?Abraham Margulies I. Hardin Negus, [place], CCC-SLP ?Acute Rehabilitation Services ?Office number [phone] ?Pager [phone] ? ?Horton Marshall ? ?11/22/2021, 4:17 PM ? ? ? ?

## 2021-11-22 NOTE — Care Management Important Message (Signed)
Important Message ? ?Patient Details  ?Name: Cassidy Hernandez ?MRN: [id number] ?Date of Birth: 05/03/1941 ? ? ?Medicare Important Message Given:  Yes ? ? ? ? ?Chantry Headen ?11/22/2021, 2:42 PM ?

## 2021-11-22 NOTE — TOC Progression Note (Addendum)
Transition of Care (TOC) - Progression Note  ? ? ?Patient Details  ?Name: Cassidy Hernandez ?MRN: [id number] ?Date of Birth: 05/07/1941 ? ?Transition of Care (TOC) CM/SW Contact  ?Pollie Friar, RN ?Phone Number: ?11/22/2021, 9:45 AM ? ?Clinical Narrative:    ?CM left voicemail for Dorian Pod at [facility] [employer]. Awaiting return call.  ? ?1440: Received call back from [place]. CM has faxed her the required information for insurance auth. Dorian Pod to start insurance auth today.  ? ? ?Expected Discharge Plan: [place] ?Barriers to Discharge: Continued Medical Work up ? ?Expected Discharge Plan and Services ?Expected Discharge Plan: [place] ?  ?Discharge Planning Services: CM Consult ?Post Acute Care Choice: IP Rehab ?Living arrangements for the past 2 months: [place] ?                ?  ?  ?  ?  ?  ?  ?  ?  ?  ?  ? ? ?Social Determinants of Health (SDOH) Interventions ?  ? ?Readmission Risk Interventions ?   ? View : No data to display.  ?  ?  ?  ? ? ?

## 2021-11-22 NOTE — Progress Notes (Signed)
Physical Therapy Treatment ?Patient Details ?Name: Cassidy Hernandez ?MRN: [id number] ?DOB: [birth year] ?Today's Date: 11/22/2021 ? ? ?History of Present Illness 81 y.o. female presents to [facility] hospital on 11/16/2021 with global aphasia. CT  head demonstrates L MCA CVA. Pt underwent revascularization on 11/16/2021. PMH includes PVD, CAD, HTN, HLD, DM2. ? ?  ?PT Comments  ? ? Progressing well toward goals.  Emphasis on safety with transitions, transfers and progression of gait stability/quality and stamina. ?   ?Recommendations for follow up therapy are one component of a multi-disciplinary discharge planning process, led by the attending physician.  Recommendations may be updated based on patient status, additional functional criteria and insurance authorization. ? ?Follow Up Recommendations ? Acute inpatient rehab (3hours/day) ?  ?  ?Assistance Recommended at Discharge Intermittent Supervision/Assistance  ?Patient can return home with the following A little help with walking and/or transfers;A little help with bathing/dressing/bathroom;Assist for transportation;Help with stairs or ramp for entrance ?  ?Equipment Recommendations ? Other (comment) (TBA)  ?  ?Recommendations for Other Services   ? ? ?  ?Precautions / Restrictions Precautions ?Precautions: Fall  ?  ? ?Mobility ? Bed Mobility ?Overal bed mobility: Needs Assistance ?Bed Mobility: Supine to Sit ?  ?Sidelying to sit: Min assist ?  ?  ?  ?General bed mobility comments: roll over onto R elbow and up without assist. ?  ? ?Transfers ?Overall transfer level: Needs assistance ?Equipment used: Rolling walker (2 wheels) ?Transfers: Sit to/from Stand ?Sit to Stand: Min guard ?  ?  ?  ?  ?  ?General transfer comment: cues for hand placement/safety,  No assist needed. ?  ? ?Ambulation/Gait ?Ambulation/Gait assistance: Min guard, Min assist ?Gait Distance (Feet): 120 Feet (x2 with standing rest to regroup.) ?Assistive device: Rolling walker (2 wheels) ?Gait Pattern/deviations:  Step-through pattern ?  ?Gait velocity interpretation: <1.8 ft/sec, indicate of risk for recurrent falls ?  ?General Gait Details: improved heel/toe pattern and overall stability.  Moderate use of the RW ? ? ?Stairs ?  ?  ?  ?  ?  ? ? ?Wheelchair Mobility ?  ? ?Modified Rankin (Stroke Patients Only) ?Modified Rankin (Stroke Patients Only) ?Pre-Morbid Rankin Score: Slight disability ?Modified Rankin: Moderately severe disability ? ? ?  ?Balance Overall balance assessment: Needs assistance ?Sitting-balance support: No upper extremity supported, Feet supported ?Sitting balance-Leahy Scale: Fair ?  ?  ?  ?Standing balance-Leahy Scale: Poor ?Standing balance comment: reliant on external device for standing balance ?  ?  ?  ?  ?  ?  ?  ?  ?  ?  ?  ?  ? ?  ?Cognition Arousal/Alertness: Awake/alert ?Behavior During Therapy: Flat affect ?Overall Cognitive Status: No family/caregiver present to determine baseline cognitive functioning ?  ?  ?  ?  ?  ?  ?  ?  ?  ?  ?  ?  ?  ?  ?  ?  ?General Comments: difficulty wit word finding, able to answer most questions ?  ?  ? ?  ?Exercises   ? ?  ?General Comments   ?  ?  ? ?Pertinent Vitals/Pain Pain Assessment ?Pain Assessment: Faces ?Faces Pain Scale: No hurt ?Pain Intervention(s): Monitored during session  ? ? ?Home Living   ?  ?  ?  ?  ?  ?  ?  ?  ?  ?   ?  ?Prior Function    ?  ?  ?   ? ?PT Goals (current goals can now be  found in the care plan section) Acute Rehab PT Goals ?Patient Stated Goal: to go to rehab ?PT Goal Formulation: Patient unable to participate in goal setting ?Time For Goal Achievement: 12/01/21 ?Potential to Achieve Goals: Good ?Progress towards PT goals: Progressing toward goals ? ?  ?Frequency ? ? ? Min 4X/week ? ? ? ?  ?PT Plan Current plan remains appropriate  ? ? ?Co-evaluation   ?  ?  ?  ?  ? ?  ?AM-PAC PT "6 Clicks" Mobility   ?Outcome Measure ? Help needed turning from your back to your side while in a flat bed without using bedrails?: A Little ?Help  needed moving from lying on your back to sitting on the side of a flat bed without using bedrails?: A Little ?Help needed moving to and from a bed to a chair (including a wheelchair)?: A Little ?Help needed standing up from a chair using your arms (e.g., wheelchair or bedside chair)?: A Little ?Help needed to walk in hospital room?: A Little ?Help needed climbing 3-5 steps with a railing? : A Little ?6 Click Score: 18 ? ?  ?End of Session   ?Activity Tolerance: Patient tolerated treatment well ?Patient left: in chair;with call bell/phone within reach ?Nurse Communication: Mobility status ?PT Visit Diagnosis: Other abnormalities of gait and mobility (R26.89);Muscle weakness (generalized) (M62.81);Other symptoms and signs involving the nervous system (R29.898) ?  ? ? ?Time: 1644-1700 ?PT Time Calculation (min) (ACUTE ONLY): 16 min ? ?Charges:  $Gait Training: 8-22 mins          ?          ? ?11/22/2021 ? ?Ginger Carne., PT ?Acute Rehabilitation Services ?[phone]  (pager) ?[phone]  (office) ? ? ?Tessie Fass Jimmylee Ratterree ?11/22/2021, 5:08 PM ? ?

## 2021-11-23 DIAGNOSIS — I63512 Cerebral infarction due to unspecified occlusion or stenosis of left middle cerebral artery: Secondary | ICD-10-CM | POA: Diagnosis not present

## 2021-11-23 LAB — GLUCOSE, CAPILLARY
Glucose-Capillary: 108 mg/dL — ABNORMAL HIGH (ref 70–99)
Glucose-Capillary: 122 mg/dL — ABNORMAL HIGH (ref 70–99)
Glucose-Capillary: 105 mg/dL — ABNORMAL HIGH (ref 70–99)
Glucose-Capillary: 137 mg/dL — ABNORMAL HIGH (ref 70–99)

## 2021-11-23 LAB — CREATININE, SERUM
Creatinine, Ser: 1.08 mg/dL — ABNORMAL HIGH (ref 0.44–1.00)
GFR, Estimated: 51 mL/min — ABNORMAL LOW (ref >60)

## 2021-11-23 NOTE — Progress Notes (Signed)
Physical Therapy Treatment ?Patient Details ?Name: Cassidy Hernandez ?MRN: [id number] ?DOB: 19-Nov-1940 ?Today's Date: 11/23/2021 ? ? ?History of Present Illness 81 y.o. female presents to [facility] hospital on 11/16/2021 with global aphasia. CT  head demonstrates L MCA CVA. Pt underwent revascularization on 11/16/2021. PMH includes PVD, CAD, HTN, HLD, DM2. ? ?  ?PT Comments  ? ? Improving steadily.  Emphasis on transitions, transfers and gait without assist.  Pt transitioned to gait with the rail and min guard, working on scanning, abrupt turns and increasing gait speed to command. ?   ?Recommendations for follow up therapy are one component of a multi-disciplinary discharge planning process, led by the attending physician.  Recommendations may be updated based on patient status, additional functional criteria and insurance authorization. ? ?Follow Up Recommendations ? Acute inpatient rehab (3hours/day) ?  ?  ?Assistance Recommended at Discharge Intermittent Supervision/Assistance  ?Patient can return home with the following A little help with walking and/or transfers;A little help with bathing/dressing/bathroom;Assist for transportation;Help with stairs or ramp for entrance ?  ?Equipment Recommendations ? Other (comment) (TBA)  ?  ?Recommendations for Other Services   ? ? ?  ?Precautions / Restrictions Precautions ?Precautions: Fall  ?  ? ?Mobility ? Bed Mobility ?Overal bed mobility: Modified Independent ?  ?  ?  ?  ?  ?  ?  ?  ? ?Transfers ?Overall transfer level: Modified independent ?  ?  ?  ?  ?  ?  ?  ?  ?General transfer comment: pt using hands appropriately for safety ?  ? ?Ambulation/Gait ?Ambulation/Gait assistance: Supervision ?Gait Distance (Feet): 300 Feet ?Assistive device: Rolling walker (2 wheels) (rail) ?Gait Pattern/deviations: Step-through pattern ?Gait velocity: slower to moderate ?Gait velocity interpretation: 1.31 - 2.62 ft/sec, indicative of limited community ambulator ?  ?General Gait Details: improved  heel/toe pattern and overall stability.  Light use of the RW, so decided to try use of rail with scanning.  Pt able to alternate between the rail and no AD with min guard when using not device. ? ? ?Stairs ?  ?  ?  ?  ?  ? ? ?Wheelchair Mobility ?  ? ?Modified Rankin (Stroke Patients Only) ?Modified Rankin (Stroke Patients Only) ?Pre-Morbid Rankin Score: Slight disability ?Modified Rankin: Moderate disability ? ? ?  ?Balance Overall balance assessment: Needs assistance ?  ?Sitting balance-Leahy Scale: Fair ?  ?  ?Standing balance support: Single extremity supported, During functional activity, No upper extremity supported ?Standing balance-Leahy Scale: Fair ?Standing balance comment: prefers AD to none at this point, but improving stability overall. ?  ?  ?  ?  ?  ?  ?  ?  ?  ?  ?  ?  ? ?  ?Cognition Arousal/Alertness: Awake/alert ?Behavior During Therapy: [facility] for tasks assessed/performed ?Overall Cognitive Status: No family/caregiver present to determine baseline cognitive functioning (NT formally, much improved) ?  ?  ?  ?  ?  ?  ?  ?  ?  ?  ?  ?  ?  ?  ?  ?  ?  ?  ?  ? ?  ?Exercises   ? ?  ?General Comments   ?  ?  ? ?Pertinent Vitals/Pain Pain Assessment ?Pain Assessment: Faces ?Faces Pain Scale: No hurt ?Pain Intervention(s): Monitored during session  ? ? ?Home Living   ?  ?  ?  ?  ?  ?  ?  ?  ?  ?   ?  ?Prior Function    ?  ?  ?   ? ?  PT Goals (current goals can now be found in the care plan section) Acute Rehab PT Goals ?Time For Goal Achievement: 12/01/21 ?Potential to Achieve Goals: Good ?Progress towards PT goals: Progressing toward goals ? ?  ?Frequency ? ? ? Min 4X/week ? ? ? ?  ?PT Plan Current plan remains appropriate  ? ? ?Co-evaluation   ?  ?  ?  ?  ? ?  ?AM-PAC PT "6 Clicks" Mobility   ?Outcome Measure ? Help needed turning from your back to your side while in a flat bed without using bedrails?: None ?Help needed moving from lying on your back to sitting on the side of a flat bed without using  bedrails?: None ?Help needed moving to and from a bed to a chair (including a wheelchair)?: A Little ?Help needed standing up from a chair using your arms (e.g., wheelchair or bedside chair)?: A Little ?Help needed to walk in hospital room?: A Little ?Help needed climbing 3-5 steps with a railing? : A Little ?6 Click Score: 20 ? ?  ?End of Session   ?Activity Tolerance: Patient tolerated treatment well ?Patient left: in bed;with call bell/phone within reach ?Nurse Communication: Mobility status ?PT Visit Diagnosis: Other abnormalities of gait and mobility (R26.89);Other symptoms and signs involving the nervous system (R29.898) ?  ? ? ?Time: 0263-7858 ?PT Time Calculation (min) (ACUTE ONLY): 14 min ? ?Charges:  $Gait Training: 8-22 mins          ?          ? ?11/23/2021 ? ?Ginger Carne., PT ?Acute Rehabilitation Services ?[phone]  (pager) ?[phone]  (office) ? ? ?Cassidy Hernandez ?11/23/2021, 11:51 AM ? ?

## 2021-11-23 NOTE — TOC Progression Note (Signed)
Transition of Care (TOC) - Progression Note  ? ? ?Patient Details  ?Name: Cassidy Hernandez ?MRN: [id number] ?Date of Birth: [birth year] ? ?Transition of Care (TOC) CM/SW Contact  ?Pollie Friar, RN ?Phone Number: ?11/23/2021, 2:26 PM ? ?Clinical Narrative:    ?CM called and left voicemail for Dorian Pod at [facility]. Waiting to hear about insurance authorization. ? ? ?Expected Discharge Plan: [place] ?Barriers to Discharge: Continued Medical Work up ? ?Expected Discharge Plan and Services ?Expected Discharge Plan: [place] ?  ?Discharge Planning Services: CM Consult ?Post Acute Care Choice: IP Rehab ?Living arrangements for the past 2 months: [place] ?                ?  ?  ?  ?  ?  ?  ?  ?  ?  ?  ? ? ?Social Determinants of Health (SDOH) Interventions ?  ? ?Readmission Risk Interventions ?   ? View : No data to display.  ?  ?  ?  ? ? ?

## 2021-11-23 NOTE — Progress Notes (Signed)
STROKE TEAM PROGRESS NOTE  ? ?INTERVAL HISTORY ? ?Patient is seen in her room  with no family at the bedside. Still with  aphasia. Awaiting CIR placement with [facility] ? ? ?Vitals:  ? 11/22/21 2100 11/23/21 0401 11/23/21 0759 11/23/21 1229  ?BP: 130/63 (!) 118/59 (!) 139/105 (!) 154/66  ?Pulse: 86 74 74 86  ?Resp: '16  20 18  '$ ?Temp: 98.5 ?F (36.9 ?C) 97.8 ?F (36.6 ?C) 97.9 ?F (36.6 ?C) 97.7 ?F (36.5 ?C)  ?TempSrc: Oral Oral Oral Oral  ?SpO2: 92% 95% 96% 95%  ?Weight:      ? ?CBC:  ?Recent Labs  ?Lab 11/16/21 ?1624 11/16/21 ?2341 11/17/21 ?0205 11/18/21 ?5852 11/19/21 ?0425 11/21/21 ?0243  ?WBC 14.6*  --  19.5*   < > 10.3 8.3  ?NEUTROABS 11.2*  --  15.9*  --   --   --   ?HGB 13.3   < > 11.6*   < > 8.6* 9.1*  ?HCT 40.5   < > 34.6*   < > 25.9* 26.8*  ?MCV 87.5  --  88.9   < > 88.7 88.2  ?PLT 267  --  233   < > 202 256  ? < > = values in this interval not displayed.  ? ? ?Basic Metabolic Panel:  ?Recent Labs  ?Lab 11/19/21 ?0425 11/21/21 ?0243 11/23/21 ?7782  ?NA 134* 135  --   ?K 3.4* 3.5  --   ?CL 104 103  --   ?CO2 22 25  --   ?GLUCOSE 97 106*  --   ?BUN 15 15  --   ?CREATININE 0.99 1.05* 1.08*  ?CALCIUM 8.0* 8.5*  --   ? ? ?Lipid Panel:  ?Recent Labs  ?Lab 11/17/21 ?0205  ?CHOL 99  ?TRIG 159*  ?HDL 47  ?CHOLHDL 2.1  ?VLDL 32  ?[place] 20  ? ? ?HgbA1c:  ?Recent Labs  ?Lab 11/17/21 ?0205  ?HGBA1C 6.5*  ? ? ?Urine Drug Screen: No results for input(s): LABOPIA, COCAINSCRNUR, LABBENZ, AMPHETMU, THCU, LABBARB in the last 168 hours.  ?Alcohol Level No results for input(s): ETH in the last 168 hours. ? ?IMAGING past 24 hours ?No results found. ? ?PHYSICAL EXAM ? ?Physical Exam  ?Constitutional: Appears well-developed and well-nourished pleasant elderly Caucasian lady not in distress.  ?Cardiovascular: Normal rate and regular rhythm.  ?Respiratory: Effort normal, non-labored breathing on room air ? ? ?NEURO:  ?Mental Status: AA&Ox1 to person. ?Speech/Language: receptive and expressive aphasia present.  Patient can speak short  phrases and sentences and answer yes/no questions.   Naming and repetition impaired. Able to name watch but not pen. Speech is dysarthric  ? ?Cranial Nerves:  ?II: PERRL.  ?III, IV, VI: EOMI. Eyelids elevate symmetrically.  ?VII: Smile is symmetrical.   ?VIII: hearing intact to voice. ?XII: tongue is midline without fasciculations. ?Motor: 5/5 strength to all muscle groups tested.  ?Tone: is normal and bulk is normal ?Sensation- Intact to light touch bilaterally. ?Coordination: No drift.  ?Gait- deferred ? ? ? ?ASSESSMENT/PLAN ?Ms. ALDEAN SUDDETH is a 81 y.o. female with history of Anginal pain , Aortic atherosclerosis, Cancer , Carotid artery stenosis, CKD stage III ([place]), Coronary artery disease, Diastolic dysfunction, Dysarthria, GER, Hepatic steatosis, HLD (hyperlipidemia), Hypertension, PVD, Subclavian steal syndrome, and T2DM presenting to [facility] with slurred speech.  CT head initially showed an acute left MCA infarct involving and CTA head and neck showed a thrombus in the distal left M1 and proximal M2 segment as well as high-grade stenosis in  the left ICA.  She was taken for left ICA angioplasty and stent placement with a left MCA thrombectomy with Dr. Estanislado Pandy with TICI3 revascularization. ASA and brilinta given post procedure.  She remained intubated postprocedure and was extubated on 4/12.  Plan to discharge to CIR at [facility] per family request. ? 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 S/p IR with TICI3 and left ICA stenting ?Code Stroke CT head Acute left MCA infarct involving the left temporal lobe and temporoparietal lobe and left basal ganglia. Aspects 6 ?Repeat Head CT- Evolving left MCA territory infarct involving the left caudate. Diffuse contrast staining within the left temporal region, likely reflecting areas of evolving ischemia as well. No acute intracranial hemorrhage. ?CTA head & neck - Thrombus in  the distal Lt M1 and proximal M2 segments ?CT perfusion does not demonstrate infarct in Lt area ?Post IR CT no evidence of intracranial hemorrhage. ?MRI acute MCA infarct involving left caudate, left putamen and left temporal lobe with mild petechial hemorrhage ?2D Echo EF 60-65%, moderate concentric LVH, grade 1 diastolic dysfunction, mildly dilated right atrium, no atrial level shunt ?LDL 20 ?HgbA1c 6.5 ?VTE prophylaxis -lovenox ?aspirin 81 mg daily prior to admission, now on aspirin 81 mg daily and Brilinta (ticagrelor) 90 mg bid.  ?Therapy recommendations: CIR ?Disposition: Pending ? ?Hypertension ?Home meds: Amlodipine 5 mg, losartan 100 mg, Coreg 3.125 mg, chlorthalidone 25 mg ?Stable ?Keep SBP<180 ?Long-term BP goal normotensive ? ?Hyperlipidemia ?Home meds: Crestor 20 mg, Zetia 10 mg resumed in hospital ?LDL 20, goal < 70 ?Continue statin at discharge ? ?Diabetes type II Controlled ?Home meds: None ?HgbA1c 6.5, goal < 7.0 ?CBGs ?SSI ? ?Other Stroke Risk Factors ?Advanced Age >/= 63  ?Obesity, Body mass index is 30.66 kg/m?., BMI >/= 30 associated with increased stroke risk, recommend weight loss, diet and exercise as appropriate  ?Coronary artery disease ?Congestive heart failure ?Isosorbide mononitrate 60 mg ? ?Other Active Problems ?GERD ?Prilosec 20 mg ?CKD Stage III ?Cr 1.07, GFR 52 ?Leukocytosis ?WBC 19.5-> 11.1-> 10.3-> 8.3-> resolved ? ? ?Hospital day # 7 ? ? ?Total of 35 mins spent reviewing chart, discussion with patient on prognosis, Dx and plan. Discussed case with patient's nurse. Reviewed Imaging personally.  ? ?CIR pending. ? ? ? ?To contact Stroke Continuity provider, please refer to [email or web address] ?After hours, contact General Neurology ? ?

## 2021-11-24 DIAGNOSIS — E7800 Pure hypercholesterolemia, unspecified: Secondary | ICD-10-CM

## 2021-11-24 DIAGNOSIS — I6602 Occlusion and stenosis of left middle cerebral artery: Secondary | ICD-10-CM | POA: Diagnosis not present

## 2021-11-24 DIAGNOSIS — I63512 Cerebral infarction due to unspecified occlusion or stenosis of left middle cerebral artery: Secondary | ICD-10-CM | POA: Diagnosis not present

## 2021-11-24 DIAGNOSIS — I10 Essential (primary) hypertension: Secondary | ICD-10-CM | POA: Diagnosis not present

## 2021-11-24 LAB — GLUCOSE, CAPILLARY
Glucose-Capillary: 114 mg/dL — ABNORMAL HIGH (ref 70–99)
Glucose-Capillary: 111 mg/dL — ABNORMAL HIGH (ref 70–99)
Glucose-Capillary: 135 mg/dL — ABNORMAL HIGH (ref 70–99)
Glucose-Capillary: 133 mg/dL — ABNORMAL HIGH (ref 70–99)

## 2021-11-24 MED ORDER — LOSARTAN POTASSIUM 50 MG PO TABS
100.0000 mg | ORAL_TABLET | Freq: Every day | ORAL | Status: DC
  Administered 2021-11-25: 100 mg via ORAL
  Filled 2021-11-24: qty 2

## 2021-11-24 MED ORDER — CARVEDILOL 3.125 MG PO TABS
3.1250 mg | ORAL_TABLET | Freq: Two times a day (BID) | ORAL | Status: DC
Start: 2021-11-24 — End: 2021-11-25
  Administered 2021-11-25 (×2): 3.125 mg via ORAL
  Filled 2021-11-24 (×2): qty 1

## 2021-11-24 NOTE — Progress Notes (Signed)
Occupational Therapy Treatment ?Patient Details ?Name: Cassidy Hernandez ?MRN: [id number] ?DOB: [birth year] ?Today's Date: 11/24/2021 ? ? ?History of present illness 81 y.o. female presents to [facility] hospital on 11/16/2021 with global aphasia. CT  head demonstrates L MCA CVA. Pt underwent revascularization on 11/16/2021. PMH includes PVD, CAD, HTN, HLD, DM2. ?  ?OT comments ? Patient received in supine and was able to get to EOB without assistance. Patient ambulated to bathroom and was min guard for transfer to toilet for safety and was able to perform toilet hygiene without assistance. Patient stood at sink for grooming with no UE support. Patient provided red therapy band and instructions on UE HEP. Acute OT to continue to follow.   ? ?Recommendations for follow up therapy are one component of a multi-disciplinary discharge planning process, led by the attending physician.  Recommendations may be updated based on patient status, additional functional criteria and insurance authorization. ?   ?Follow Up Recommendations ? Acute inpatient rehab (3hours/day)  ?  ?Assistance Recommended at Discharge Frequent or constant Supervision/Assistance  ?Patient can return home with the following ? A lot of help with bathing/dressing/bathroom;Assistance with cooking/housework;Direct supervision/assist for medications management;Direct supervision/assist for financial management;Help with stairs or ramp for entrance;Assist for transportation;A little help with walking and/or transfers ?  ?Equipment Recommendations ? Other (comment);BSC/3in1;Tub/shower seat  ?  ?Recommendations for Other Services   ? ?  ?Precautions / Restrictions Precautions ?Precautions: Fall ?Restrictions ?Weight Bearing Restrictions: No  ? ? ?  ? ?Mobility Bed Mobility ?Overal bed mobility: Modified Independent ?  ?  ?  ?  ?  ?  ?General bed mobility comments: able to get to EOB without assistance ?  ? ?Transfers ?Overall transfer level: Modified independent ?Equipment  used: Rolling walker (2 wheels) ?Transfers: Sit to/from Stand ?Sit to Stand: Min guard ?  ?  ?  ?  ?  ?General transfer comment: min guard for safety with toilet transfers ?  ?  ?Balance Overall balance assessment: Needs assistance ?Sitting-balance support: No upper extremity supported, Feet supported ?Sitting balance-Leahy Scale: Fair ?  ?  ?Standing balance support: Single extremity supported, No upper extremity supported, During functional activity ?Standing balance-Leahy Scale: Fair ?Standing balance comment: able to perform grooming tasks without support for balance ?  ?  ?  ?  ?  ?  ?  ?  ?  ?  ?  ?   ? ?ADL either performed or assessed with clinical judgement  ? ?ADL Overall ADL's : Needs assistance/impaired ?  ?  ?Grooming: Wash/dry hands;Wash/dry face;Oral care;Min guard;Standing ?Grooming Details (indicate cue type and reason): at sink ?  ?  ?  ?  ?  ?  ?Lower Body Dressing: Moderate assistance;Sitting/lateral leans ?Lower Body Dressing Details (indicate cue type and reason): patient was able to doff socks but required mod assist to donn due to unable to get socks over toes ?Toilet Transfer: Financial planner (2 wheels) ?Toilet Transfer Details (indicate cue type and reason): verbal cues for safety ?Toileting- Clothing Manipulation and Hygiene: Modified independent;Sitting/lateral lean ?Toileting - Clothing Manipulation Details (indicate cue type and reason): able to perform toilet hygiene seated without assistance ?  ?  ?  ?General ADL Comments: occasional cues for safety ?  ? ?Extremity/Trunk Assessment Upper Extremity Assessment ?RUE Deficits / Details: brusing to R shoulder area ?LUE Deficits / Details: ROM seems WFL. generally weak. poor coordintion but also difficulty following commands to assess. impaired sensation? ?LUE Sensation: WNL ?LUE Coordination: WNL ?  ?  ?  ?  ?  ? ?  Vision   ?  ?  ?Perception   ?  ?Praxis   ?  ? ?Cognition Arousal/Alertness: Awake/alert ?Behavior  During Therapy: [facility] for tasks assessed/performed ?Overall Cognitive Status: No family/caregiver present to determine baseline cognitive functioning ?  ?  ?  ?  ?  ?  ?  ?  ?  ?  ?  ?  ?  ?  ?  ?  ?General Comments: increased communication, aware her birthday was yesterday ?  ?  ?   ?Exercises Exercises: General Upper Extremity ?General Exercises - Upper Extremity ?Shoulder Flexion: Strengthening, Both, 10 reps, Seated, Theraband ?Theraband Level (Shoulder Flexion): Level 2 (Red) ?Shoulder ABduction: Strengthening, Both, 10 reps, Seated, Theraband ?Theraband Level (Shoulder Abduction): Level 2 (Red) ?Elbow Flexion: Strengthening, Both, 10 reps, Seated, Theraband ?Theraband Level (Elbow Flexion): Level 2 (Red) ?Elbow Extension: Strengthening, Both, 10 reps, Seated, Theraband ?Theraband Level (Elbow Extension): Level 2 (Red) ? ?  ?Shoulder Instructions   ? ? ?  ?General Comments    ? ? ?Pertinent Vitals/ Pain       Pain Assessment ?Pain Assessment: No/denies pain ?Pain Intervention(s): Monitored during session ? ?Home Living   ?  ?  ?  ?  ?  ?  ?  ?  ?  ?  ?  ?  ?  ?  ?  ?  ?  ?  ? ?  ?Prior Functioning/Environment    ?  ?  ?  ?   ? ?Frequency ? Min 2X/week  ? ? ? ? ?  ?Progress Toward Goals ? ?OT Goals(current goals can now be found in the care plan section) ? Progress towards OT goals: Progressing toward goals ? ?Acute Rehab OT Goals ?Patient Stated Goal: get better ?OT Goal Formulation: With patient ?Time For Goal Achievement: 12/01/21 ?Potential to Achieve Goals: Good ?ADL Goals ?Pt Will Perform Grooming: with modified independence;standing ?Pt Will Perform Lower Body Dressing: with min guard assist;sit to/from stand ?Pt Will Transfer to Toilet: with supervision;ambulating ?Additional ADL Goal #1: Pt will demonstrate increased activity tolerance to complete at least 3 ADLs in standing with supervision A ?Additional ADL Goal #2: Pt will independently follow 3 step directional task as a precursor to IADLs  ?Plan  Discharge plan remains appropriate   ? ?Co-evaluation ? ? ?   ?  ?  ?  ?  ? ?  ?AM-PAC OT "6 Clicks" Daily Activity     ?Outcome Measure ? ? Help from another person eating meals?: None ?Help from another person taking care of personal grooming?: A Little ?Help from another person toileting, which includes using toliet, bedpan, or urinal?: A Little ?Help from another person bathing (including washing, rinsing, drying)?: A Lot ?Help from another person to put on and taking off regular upper body clothing?: A Little ?Help from another person to put on and taking off regular lower body clothing?: A Lot ?6 Click Score: 17 ? ?  ?End of Session Equipment Utilized During Treatment: Gait belt;Rolling walker (2 wheels) ? ?OT Visit Diagnosis: Unsteadiness on feet (R26.81);Other abnormalities of gait and mobility (R26.89);Muscle weakness (generalized) (M62.81);Apraxia (R48.2);Cognitive communication deficit (R41.841) ?Symptoms and signs involving cognitive functions: Cerebral infarction ?  ?Activity Tolerance Patient tolerated treatment well ?  ?Patient Left in chair;with call bell/phone within reach;with chair alarm set ?  ?Nurse Communication Mobility status ?  ? ?   ? ?Time: 2119-4174 ?OT Time Calculation (min): 25 min ? ?Charges: OT General Charges ?$OT Visit: 1 Visit ?OT Treatments ?$Self Care/Home  Management : 8-22 mins ?$Therapeutic Exercise: 8-22 mins ? ?Lodema Hong, OTA ?Acute Rehabilitation Services  ?Pager [phone] ?Office [phone] ? ? ?[place] ?11/24/2021, 10:38 AM ?

## 2021-11-24 NOTE — TOC Progression Note (Signed)
Transition of Care (TOC) - Progression Note  ? ? ?Patient Details  ?Name: ARDATH LEPAK ?MRN: [id number] ?Date of Birth: 14-Apr-1941 ? ?Transition of Care (TOC) CM/SW Contact  ?Pollie Friar, RN ?Phone Number: ?11/24/2021, 12:47 PM ? ?Clinical Narrative:    ?Dorian Pod with [facility] called and [employer] has requested more information. She has sent this in and hopes to hear a decision by late today or early am. She asked that transportation be set up for 1 pm tomorrow. CM has updated MD and patients daughter.  ?TOC following. ? ? ?Expected Discharge Plan: [place] ?Barriers to Discharge: Continued Medical Work up ? ?Expected Discharge Plan and Services ?Expected Discharge Plan: [place] ?  ?Discharge Planning Services: CM Consult ?Post Acute Care Choice: IP Rehab ?Living arrangements for the past 2 months: [place] ?                ?  ?  ?  ?  ?  ?  ?  ?  ?  ?  ? ? ?Social Determinants of Health (SDOH) Interventions ?  ? ?Readmission Risk Interventions ?   ? View : No data to display.  ?  ?  ?  ? ? ?

## 2021-11-24 NOTE — Progress Notes (Addendum)
STROKE TEAM PROGRESS NOTE  ? ?INTERVAL HISTORY ?Patient is seen in her room with one family member at the bedside.  Speech continues to improve.  Plan is to discharge her to CIR at [facility] at 1300 tomorrow. ? ? ?Vitals:  ? 11/23/21 2331 11/24/21 0405 11/24/21 0722 11/24/21 1129  ?BP: (!) 152/73 (!) 143/64 135/60 (!) 154/66  ?Pulse: 86 85 74 68  ?Resp:  '18 20 14  '$ ?Temp: 98.3 ?F (36.8 ?C) 98.1 ?F (36.7 ?C) 97.8 ?F (36.6 ?C) 97.9 ?F (36.6 ?C)  ?TempSrc: Oral Oral Oral Oral  ?SpO2: 97% 95% 99% 97%  ?Weight:      ? ?CBC:  ?Recent Labs  ?Lab 11/19/21 ?0425 11/21/21 ?6063  ?WBC 10.3 8.3  ?HGB 8.6* 9.1*  ?HCT 25.9* 26.8*  ?MCV 88.7 88.2  ?PLT 202 256  ? ? ?Basic Metabolic Panel:  ?Recent Labs  ?Lab 11/19/21 ?0425 11/21/21 ?0243 11/23/21 ?0160  ?NA 134* 135  --   ?K 3.4* 3.5  --   ?CL 104 103  --   ?CO2 22 25  --   ?GLUCOSE 97 106*  --   ?BUN 15 15  --   ?CREATININE 0.99 1.05* 1.08*  ?CALCIUM 8.0* 8.5*  --   ? ? ?Lipid Panel:  ?No results for input(s): CHOL, TRIG, HDL, CHOLHDL, VLDL, LDLCALC in the last 168 hours. ? ?HgbA1c:  ?No results for input(s): HGBA1C in the last 168 hours. ? ?Urine Drug Screen: No results for input(s): LABOPIA, COCAINSCRNUR, LABBENZ, AMPHETMU, THCU, LABBARB in the last 168 hours.  ?Alcohol Level No results for input(s): ETH in the last 168 hours. ? ?IMAGING past 24 hours ?No results found. ? ?PHYSICAL EXAM ? ?Physical Exam  ?Constitutional: Appears well-developed and well-nourished pleasant elderly Caucasian lady not in distress.  ?Cardiovascular: Normal rate and regular rhythm.  ?Respiratory: Effort normal, non-labored breathing on room air ? ? ?NEURO:  ?Mental Status: AA&Ox1 to person. ?Speech/Language: receptive and expressive aphasia present.  Patient can speak short phrases and sentences and answer yes/no questions.   Naming intact today but repetition impaired.  Speech is dysarthric  ? ?Cranial Nerves:  ?II: PERRL.  ?III, IV, VI: EOMI. Eyelids elevate symmetrically.  ?VII: Smile is symmetrical.    ?VIII: hearing intact to voice. ?XII: tongue is midline without fasciculations. ?Motor: 5/5 strength to all muscle groups tested.  ?Tone: is normal and bulk is normal ?Sensation- Intact to light touch bilaterally. ?Coordination: No drift.  ?Gait- deferred ? ? ? ?ASSESSMENT/PLAN ?Cassidy Hernandez is a 81 y.o. female with history of Anginal pain , Aortic atherosclerosis, Cancer , Carotid artery stenosis, CKD stage III ([place]), Coronary artery disease, Diastolic dysfunction, Dysarthria, GER, Hepatic steatosis, HLD (hyperlipidemia), Hypertension, PVD, Subclavian steal syndrome, and T2DM presenting to [facility] with slurred speech.  CT head initially showed an acute left MCA infarct involving and CTA head and neck showed a thrombus in the distal left M1 and proximal M2 segment as well as high-grade stenosis in the left ICA.  She was taken for left ICA angioplasty and stent placement with a left MCA thrombectomy with Dr. Estanislado Pandy with TICI3 revascularization. ASA and brilinta given post procedure.  She remained intubated postprocedure and was extubated on 4/12.  Plan to discharge to CIR at [facility] per family request. ? 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 S/p IR with TICI3 and left ICA stenting ?Code Stroke CT head Acute left MCA  infarct involving the left temporal lobe and temporoparietal lobe and left basal ganglia. Aspects 6 ?Repeat Head CT- Evolving left MCA territory infarct involving the left caudate. Diffuse contrast staining within the left temporal region, likely reflecting areas of evolving ischemia as well. No acute intracranial hemorrhage. ?CTA head & neck - Thrombus in the distal Lt M1 and proximal M2 segments ?CT perfusion does not demonstrate infarct in Lt area ?Post IR CT no evidence of intracranial hemorrhage. ?MRI acute MCA infarct involving left caudate, left putamen and left temporal lobe with mild petechial  hemorrhage ?2D Echo EF 60-65%, moderate concentric LVH, grade 1 diastolic dysfunction, mildly dilated right atrium, no atrial level shunt ?LDL 20 ?HgbA1c 6.5 ?VTE prophylaxis -lovenox ?aspirin 81 mg daily prior to admission, now on aspirin 81 mg daily and Brilinta (ticagrelor) 90 mg bid.  ?Therapy recommendations: CIR ?Disposition: Pending ? ?Hypertension ?Home meds: Amlodipine 5 mg, losartan 100 mg, Coreg 3.125 mg, chlorthalidone 25 mg ?Stable ?Keep SBP<180 ?Long-term BP goal normotensive ? ?Hyperlipidemia ?Home meds: Crestor 20 mg, Zetia 10 mg resumed in hospital ?LDL 20, goal < 70 ?Continue statin at discharge ? ?Diabetes type II Controlled ?Home meds: None ?HgbA1c 6.5, goal < 7.0 ?CBGs ?SSI ? ?Other Stroke Risk Factors ?Advanced Age >/= 14  ?Obesity, Body mass index is 30.66 kg/m?., BMI >/= 30 associated with increased stroke risk, recommend weight loss, diet and exercise as appropriate  ?Coronary artery disease ?Congestive heart failure ?Isosorbide mononitrate 60 mg ? ?Other Active Problems ?GERD ?Prilosec 20 mg ?CKD Stage III ?Cr 1.07, GFR 52 ?Leukocytosis ?WBC 19.5-> 11.1-> 10.3-> 8.3-> resolved ? ? ?Hospital day # 8 ? ?[place] , MSN, AGACNP-BC ?[facility] ?See Amion for schedule and pager information ?11/24/2021 3:55 PM ?  ?ATTENDING NOTE: ?I reviewed above note and agree with the assessment and plan. Pt was seen and examined.  ? ?No acute event overnight, neuro stable and essentially unchanged. Still has partial expressive aphasia but improving over time. Pending placement to [facility], hopefully tomorrow. Continue DAPT and statin. BP on the higher end, will resume home coreg and losartan.  ? ?For detailed assessment and plan, please refer to above as I have made changes wherever appropriate.  ? ?Rosalin Hawking, MD PhD ?Stroke Neurology ?11/24/2021 ?10:08 PM ? ? ? ? ?To contact Stroke Continuity provider, please refer to [email or web address] ?After hours, contact General Neurology ? ?

## 2021-11-24 NOTE — Progress Notes (Signed)
Physical Therapy Treatment ?Patient Details ?Name: Cassidy Hernandez ?MRN: [id number] ?DOB: 05-13-1941 ?Today's Date: 11/24/2021 ? ? ?History of Present Illness 81 y.o. female presents to [facility] hospital on 11/16/2021 with global aphasia. CT  head demonstrates L MCA CVA. Pt underwent revascularization on 11/16/2021. PMH includes PVD, CAD, HTN, HLD, DM2. ? ?  ?PT Comments  ? ? Continued progression of functional mobility and gait training. She shows the ability to perform bed mobility and tranfers supervision level. She shows good control during gait with the RW but requires high guard for gait w/o, as well as reduced functional endurance. She would not tolerate any balance challenges w/o support and has to be cued not to reach to the wall or rails for support during dynamic movement. Pt continues to be limited by functional endurance, strength, and high level balance. She also shows a need for further cognitive and speech training as she only engaged in limited conversation during the session and seems to have decreased awareness of deficits. Pt would benefit from continued PT services to maximize independence for high level balance, functional strength, and endurance training. Plan and d/c recommendations remain unchanged. PT to follow up acutely as able.  ?  ?Recommendations for follow up therapy are one component of a multi-disciplinary discharge planning process, led by the attending physician.  Recommendations may be updated based on patient status, additional functional criteria and insurance authorization. ? ?Follow Up Recommendations ? Acute inpatient rehab (3hours/day) ?  ?  ?Assistance Recommended at Discharge Intermittent Supervision/Assistance  ?Patient can return home with the following A little help with walking and/or transfers;A little help with bathing/dressing/bathroom;Assist for transportation;Help with stairs or ramp for entrance ?  ?Equipment Recommendations ? Other (comment) (Defer to next venue)  ?   ?Recommendations for Other Services Rehab consult ? ? ?  ?Precautions / Restrictions Precautions ?Precautions: Fall ?Restrictions ?Weight Bearing Restrictions: No  ?  ? ?Mobility ? Bed Mobility ?Overal bed mobility: Modified Independent ?Bed Mobility: Supine to Sit ?  ?  ?  ?Sit to supine: Supervision ?  ?General bed mobility comments: Able to get EOB w/o assit, able to get to supine from EOB w/o A, requires assist for boost up in bed. ?  ? ?Transfers ?Overall transfer level: Modified independent ?Equipment used: Rolling walker (2 wheels) ?Transfers: Sit to/from Stand ?Sit to Stand: Supervision ?  ?  ?  ?  ?  ?General transfer comment: Supervision for transfers ?  ? ?Ambulation/Gait ?Ambulation/Gait assistance: Supervision, Min guard ?Gait Distance (Feet): 200 Feet (75 feet with no AD) ?Assistive device: Rolling walker (2 wheels), None (progressed to no AD) ?Gait Pattern/deviations: Step-through pattern, Decreased dorsiflexion - right, Decreased dorsiflexion - left, Decreased weight shift to right, Staggering left, Staggering right ?Gait velocity: decreased ?  ?  ?General Gait Details: Pt shows good control with RW with slight drifiting R>L, once RW was removed she fatigued quicker and required min guard. Her steps required more effort to clear during swing phase and she shows poor eccentric control on legs at the end of swing phase. ? ? ?Stairs ?  ?  ?  ?  ?  ? ? ?Wheelchair Mobility ?  ? ?Modified Rankin (Stroke Patients Only) ?Modified Rankin (Stroke Patients Only) ?Pre-Morbid Rankin Score: Slight disability ?Modified Rankin: Moderate disability ? ? ?  ?Balance Overall balance assessment: Needs assistance ?Sitting-balance support: No upper extremity supported, Feet supported ?Sitting balance-Leahy Scale: Fair ?Sitting balance - Comments: Able to sit EOB w/o difficulty ?  ?  ?Standing  balance-Leahy Scale: Fair ?Standing balance comment: Able to walk w/o RW and perform tasks at sink w/o difficulty ?  ?  ?  ?  ?  ?   ?  ?  ?  ?  ?  ?  ? ?  ?Cognition Arousal/Alertness: Awake/alert ?Behavior During Therapy: [facility] for tasks assessed/performed ?Overall Cognitive Status: No family/caregiver present to determine baseline cognitive functioning ?  ?  ?  ?  ?  ?  ?  ?  ?  ?  ?  ?  ?  ?  ?  ?  ?General Comments: Responds well to yes/no questions, slightly impusive and does not always communicate needs ?  ?  ? ?  ?Exercises   ? ?  ?General Comments General comments (skin integrity, edema, etc.): VSS on RA, pt shows increased labor of breath while trying to scoot up in bed, it returned to normal after short rest. ?  ?  ? ?Pertinent Vitals/Pain Pain Assessment ?Pain Assessment: No/denies pain  ? ? ?Home Living   ?  ?  ?  ?  ?  ?  ?  ?  ?  ?   ?  ?Prior Function    ?  ?  ?   ? ?PT Goals (current goals can now be found in the care plan section) Acute Rehab PT Goals ?Patient Stated Goal: to go to rehab ?PT Goal Formulation: Patient unable to participate in goal setting ?Time For Goal Achievement: 12/01/21 ?Potential to Achieve Goals: Good ?Progress towards PT goals: Progressing toward goals ? ?  ?Frequency ? ? ? Min 4X/week ? ? ? ?  ?PT Plan Current plan remains appropriate  ? ? ?Co-evaluation   ?  ?  ?  ?  ? ?  ?AM-PAC PT "6 Clicks" Mobility   ?Outcome Measure ? Help needed turning from your back to your side while in a flat bed without using bedrails?: None ?Help needed moving from lying on your back to sitting on the side of a flat bed without using bedrails?: None ?Help needed moving to and from a bed to a chair (including a wheelchair)?: A Little ?Help needed standing up from a chair using your arms (e.g., wheelchair or bedside chair)?: A Little ?Help needed to walk in hospital room?: A Little ?Help needed climbing 3-5 steps with a railing? : A Little ?6 Click Score: 20 ? ?  ?End of Session Equipment Utilized During Treatment: Gait belt ?Activity Tolerance: Patient tolerated treatment well ?Patient left: in bed;with call bell/phone within  reach;with bed alarm set ?Nurse Communication: Mobility status ?PT Visit Diagnosis: Other abnormalities of gait and mobility (R26.89);Other symptoms and signs involving the nervous system (R29.898) ?  ? ? ?Time: 1962-2297 ?PT Time Calculation (min) (ACUTE ONLY): 13 min ? ?Charges:  $Therapeutic Activity: 8-22 mins          ?          ? ?Thermon Leyland, SPT ?Acute Rehab Services ? ? ? ?Thermon Leyland ?11/24/2021, 3:03 PM ? ?

## 2021-11-25 DIAGNOSIS — I63512 Cerebral infarction due to unspecified occlusion or stenosis of left middle cerebral artery: Secondary | ICD-10-CM | POA: Diagnosis not present

## 2021-11-25 LAB — RESP PANEL BY RT-PCR (FLU A&B, COVID) ARPGX2
Influenza A by PCR: NEGATIVE
Influenza B by PCR: NEGATIVE
SARS Coronavirus 2 by RT PCR: NEGATIVE

## 2021-11-25 LAB — CBC
HCT: 29.9 % — ABNORMAL LOW (ref 36.0–46.0)
Hemoglobin: 9.8 g/dL — ABNORMAL LOW (ref 12.0–15.0)
MCH: 29.7 pg (ref 26.0–34.0)
MCHC: 32.8 g/dL (ref 30.0–36.0)
MCV: 90.6 fL (ref 80.0–100.0)
Platelets: 394 10*3/uL (ref 150–400)
RBC: 3.3 MIL/uL — ABNORMAL LOW (ref 3.87–5.11)
RDW: 14.7 % (ref 11.5–15.5)
WBC: 8.6 10*3/uL (ref 4.0–10.5)
nRBC: 0 % (ref 0.0–0.2)

## 2021-11-25 LAB — BASIC METABOLIC PANEL
Anion gap: 8 (ref 5–15)
BUN: 19 mg/dL (ref 8–23)
CO2: 23 mmol/L (ref 22–32)
Calcium: 8.7 mg/dL — ABNORMAL LOW (ref 8.9–10.3)
Chloride: 105 mmol/L (ref 98–111)
Creatinine, Ser: 1.16 mg/dL — ABNORMAL HIGH (ref 0.44–1.00)
GFR, Estimated: 47 mL/min — ABNORMAL LOW (ref >60)
Glucose, Bld: 106 mg/dL — ABNORMAL HIGH (ref 70–99)
Potassium: 3.6 mmol/L (ref 3.5–5.1)
Sodium: 136 mmol/L (ref 135–145)

## 2021-11-25 LAB — GLUCOSE, CAPILLARY
Glucose-Capillary: 107 mg/dL — ABNORMAL HIGH (ref 70–99)
Glucose-Capillary: 115 mg/dL — ABNORMAL HIGH (ref 70–99)

## 2021-11-25 MED ORDER — ASPIRIN 81 MG PO CHEW: 81.0000 mg | CHEWABLE_TABLET | Freq: Every day | ORAL | 1 refills | Status: AC

## 2021-11-25 MED ORDER — TICAGRELOR 90 MG PO TABS: 90.0000 mg | ORAL_TABLET | Freq: Two times a day (BID) | ORAL | 1 refills | Status: DC

## 2021-11-25 NOTE — Plan of Care (Signed)
?  Problem: Education: ?Goal: Knowledge of General Education information will improve ?Description: Including pain rating scale, medication(s)/side effects and non-pharmacologic comfort measures ?Outcome: Progressing ?  ?Problem: Education: ?Goal: Knowledge of disease or condition will improve ?Outcome: Progressing ?Goal: Knowledge of secondary prevention will improve (SELECT ALL) ?Outcome: Progressing ?Goal: Knowledge of patient specific risk factors will improve (INDIVIDUALIZE FOR PATIENT) ?Outcome: Progressing ?  ?

## 2021-11-25 NOTE — Progress Notes (Signed)
Patient discharged to [facility] IR via [place].  ?

## 2021-11-25 NOTE — Progress Notes (Signed)
Physical Therapy Treatment ?Patient Details ?Name: Cassidy Hernandez ?MRN: [id number] ?DOB: January 09, 1941 ?Today's Date: 11/25/2021 ? ? ?History of Present Illness 81 y.o. female presents to [facility] hospital on 11/16/2021 with global aphasia. CT  head demonstrates L MCA CVA. Pt underwent revascularization on 11/16/2021. PMH includes PVD, CAD, HTN, HLD, DM2. ? ?  ?PT Comments  ? ? Pt. Seen for final session before tranfers to AIR. Continued with gait and high level balance training today.  She still seeks support from walls and other objects during gait and has to be cued to not use UE, she walked 180 feet with no AD. She responds well to balance challenges and was able to reaching minimally to moderately out of her BOS and return to upright in multiple directions. She was able to step over a 4" hurdle w/o difficulty fwd and laterally, however she had significant difficulty with stepping over it backwards. During the standing reach she shows poor reactive responses to SPT perturbations and slowed stepping response requiring min A to prevent LOB. Session limited secondary to pt's functional endurance and she required multiple seated rest breaks throughout the session. Pt would benefit from continued PT services to maximize independence and continue progression of reactive and dynamic balance strategies. Plan and d/c recommendations remain unchanged. PT to follow up acutely as able.  ?  ?Recommendations for follow up therapy are one component of a multi-disciplinary discharge planning process, led by the attending physician.  Recommendations may be updated based on patient status, additional functional criteria and insurance authorization. ? ?Follow Up Recommendations ? Acute inpatient rehab (3hours/day) ?  ?  ?Assistance Recommended at Discharge Intermittent Supervision/Assistance  ?Patient can return home with the following A little help with walking and/or transfers;A little help with bathing/dressing/bathroom;Assist for  transportation;Help with stairs or ramp for entrance ?  ?Equipment Recommendations ? Other (comment) (defer)  ?  ?Recommendations for Other Services Rehab consult ? ? ?  ?Precautions / Restrictions Precautions ?Precautions: Fall ?Restrictions ?Weight Bearing Restrictions: No  ?  ? ?Mobility ? Bed Mobility ?Overal bed mobility: Modified Independent ?Bed Mobility: Supine to Sit, Sit to Supine ?  ?  ?Supine to sit: Supervision, HOB elevated ?Sit to supine: Supervision, HOB elevated ?  ?General bed mobility comments: HOB elevated, supervision for saftey, becomes winded during sit to supine, resolves after short rest. ?  ? ?Transfers ?Overall transfer level: Needs assistance ?Equipment used: None (places 1 hand on tray table during stand) ?Transfers: Sit to/from Stand ?Sit to Stand: Supervision ?  ?  ?  ?  ?  ?General transfer comment: Cues to not reach out for external support ?  ? ?Ambulation/Gait ?Ambulation/Gait assistance: Supervision ?Gait Distance (Feet): 120 Feet ?Assistive device: None ?Gait Pattern/deviations: Step-through pattern, Decreased dorsiflexion - right, Decreased dorsiflexion - left, Decreased weight shift to right, Staggering left, Staggering right ?Gait velocity: decreased ?  ?  ?General Gait Details: Pt requires cuing to not reach out to wall or rails for external support. Generally stable throughout gait. Later in session once fatigued she has decreased clearance of LE and poor eccentric control. ? ? ?Stairs ?  ?  ?  ?  ?  ? ? ?Wheelchair Mobility ?  ? ?Modified Rankin (Stroke Patients Only) ?Modified Rankin (Stroke Patients Only) ?Pre-Morbid Rankin Score: Slight disability ?Modified Rankin: Moderate disability ? ? ?  ?Balance Overall balance assessment: Needs assistance ?Sitting-balance support: No upper extremity supported, Feet supported ?Sitting balance-Leahy Scale: Fair ?Sitting balance - Comments: Able to sit EOB w/o difficulty ?  ?  Standing balance support: Single extremity supported, No  upper extremity supported, During functional activity ?Standing balance-Leahy Scale: Fair ?Standing balance comment: Can tolerate minimal balance challenges, poor posterior reactive strategies and slowed response with Anticipatory postural reactions ?  ?  ?  ?  ?  ?  ?  ?High Level Balance Comments: High level challenges notes in misc. exercises ?  ?  ?  ?  ? ?  ?Cognition Arousal/Alertness: Awake/alert ?Behavior During Therapy: [facility] for tasks assessed/performed ?Overall Cognitive Status: No family/caregiver present to determine baseline cognitive functioning ?  ?  ?  ?  ?  ?  ?  ?  ?  ?  ?  ?  ?  ?  ?  ?  ?  ?  ?  ? ?  ?Exercises Other Exercises ?Other Exercises: Standing stool push: pt pushing stool anteriorly and laterally then returning to upright. Supervision ?Other Exercises: Obstacle course with cone weaving x4 cones and step over: supervision level ?Other Exercises: Hurdle steps 4", 2x fwd, lateral x1 B, x1 reverse: min A for posterior balance loss ?Other Exercises: Standing multidirectional reaching. Ball on cup moving it with extended grasper. Feet together with balance perturbations from SPT: shows poor posterior reactive strategies and slowed stepping response to balance loss. ? ?  ?General Comments   ?  ?  ? ?Pertinent Vitals/Pain Pain Assessment ?Pain Assessment: No/denies pain  ? ? ?Home Living   ?  ?  ?  ?  ?  ?  ?  ?  ?  ?   ?  ?Prior Function    ?  ?  ?   ? ?PT Goals (current goals can now be found in the care plan section) Acute Rehab PT Goals ?Patient Stated Goal: to go to rehab ?PT Goal Formulation: Patient unable to participate in goal setting ?Time For Goal Achievement: 12/01/21 ?Potential to Achieve Goals: Good ?Progress towards PT goals: Progressing toward goals ? ?  ?Frequency ? ? ? Min 4X/week ? ? ? ?  ?PT Plan Current plan remains appropriate  ? ? ?Co-evaluation   ?  ?  ?  ?  ? ?  ?AM-PAC PT "6 Clicks" Mobility   ?Outcome Measure ? Help needed turning from your back to your side while in a  flat bed without using bedrails?: None ?Help needed moving from lying on your back to sitting on the side of a flat bed without using bedrails?: None ?Help needed moving to and from a bed to a chair (including a wheelchair)?: A Little ?Help needed standing up from a chair using your arms (e.g., wheelchair or bedside chair)?: A Little ?Help needed to walk in hospital room?: A Little ?Help needed climbing 3-5 steps with a railing? : A Little ?6 Click Score: 20 ? ?  ?End of Session Equipment Utilized During Treatment: Gait belt ?Activity Tolerance: Patient tolerated treatment well ?Patient left: in bed;with call bell/phone within reach;with bed alarm set ?Nurse Communication: Mobility status ?PT Visit Diagnosis: Other abnormalities of gait and mobility (R26.89);Other symptoms and signs involving the nervous system (R29.898) ?  ? ? ?Time: 0934-1000 ?PT Time Calculation (min) (ACUTE ONLY): 26 min ? ?Charges:  $Therapeutic Activity: 8-22 mins ?$Neuromuscular Re-education: 8-22 mins          ?          ? ?Thermon Leyland, SPT ?Acute Rehab Services ? ? ? ?Thermon Leyland ?11/25/2021, 11:50 AM ? ?

## 2021-11-25 NOTE — Discharge Summary (Addendum)
Stroke Discharge Summary  ?Patient ID: Cassidy Hernandez    l ?  MRN: [id number]    ?  DOB: [birth year] ? ?Date of Admission: 11/16/2021 ?Date of Discharge: 11/25/2021 ? ?Attending Physician:  Stroke, Md, MD, Stroke MD ?Consultant(s):   pulmonary/intensive care ?Patient's PCP:  Kirk Ruths, MD ? 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 S/p IR with TICI3 and left ICA stenting ?Principal Problem: ?  Acute ischemic left MCA stroke ([place]) ?Active Problems: ?  Middle cerebral artery embolism, left ? ? ?Medications to be continued on Rehab ?Allergies as of 11/25/2021   ? ?   Reactions  ? Sulfa Antibiotics Other (See Comments)  ? ?  ? ?  ?Medication List  ?  ? ?STOP taking these medications   ? ?amLODipine 5 MG tablet ?Commonly known as: NORVASC ?  ?aspirin EC 325 MG tablet ?Replaced by: aspirin 81 MG chewable tablet ?  ?chlorthalidone 25 MG tablet ?Commonly known as: HYGROTON ?  ?isosorbide mononitrate 60 MG 24 hr tablet ?Commonly known as: IMDUR ?  ? ?  ? ?TAKE these medications   ? ?acetaminophen 325 MG tablet ?Commonly known as: TYLENOL ?Take 650 mg by mouth every 8 (eight) hours as needed for moderate pain or mild pain (Back). ?  ?aspirin 81 MG chewable tablet ?Chew 1 tablet (81 mg total) by mouth daily. ?Start taking on: November 26, 2021 ?Replaces: aspirin EC 325 MG tablet ?  ?carvedilol 3.125 MG tablet ?Commonly known as: COREG ?Take 3.125 mg by mouth 2 (two) times daily. ?  ?docusate sodium 100 MG capsule ?Commonly known as: COLACE ?Take 1 capsule (100 mg total) by mouth 2 (two) times daily. ?What changed:  ?when to take this ?reasons to take this ?  ?escitalopram 10 MG tablet ?Commonly known as: LEXAPRO ?Take 10 mg by mouth daily. ?  ?ezetimibe 10 MG tablet ?Commonly known as: ZETIA ?Take 10 mg by mouth daily. ?  ?losartan 100 MG tablet ?Commonly known as: COZAAR ?Take 100 mg by mouth daily. ?  ?magnesium  chloride 64 MG Tbec SR tablet ?Commonly known as: SLOW-MAG ?Take 1 tablet by mouth daily. ?  ?multivitamin capsule ?Take 1 capsule by mouth daily. ?  ?nitroGLYCERIN 0.4 MG SL tablet ?Commonly known as: NITROSTAT ?Place 0.4 mg under the tongue every 5 (five) minutes as needed for chest pain. ?  ?omeprazole 20 MG capsule ?Commonly known as: PRILOSEC ?Take 20 mg by mouth daily. ?  ?rosuvastatin 20 MG tablet ?Commonly known as: CRESTOR ?Take 20 mg by mouth daily. ?  ?ticagrelor 90 MG Tabs tablet ?Commonly known as: BRILINTA ?Take 1 tablet (90 mg total) by mouth 2 (two) times daily. ?  ? ?  ? ? ?LABORATORY STUDIES ?CBC ?   ?Component Value Date/Time  ? WBC 8.6 11/25/2021 0240  ? RBC 3.30 (L) 11/25/2021 0240  ? HGB 9.8 (L) 11/25/2021 0240  ? HGB 15.3 04/19/2014 1227  ? HCT 29.9 (L) 11/25/2021 0240  ? HCT 46.4 04/19/2014 1227  ? PLT 394 11/25/2021 0240  ? PLT 289 04/19/2014 1227  ? MCV 90.6 11/25/2021 0240  ? MCV 93 04/19/2014 1227  ? MCH 29.7 11/25/2021 0240  ? MCHC 32.8 11/25/2021 0240  ? RDW 14.7 11/25/2021 0240  ? RDW 13.5 04/19/2014 1227  ? LYMPHSABS 1.4 11/17/2021 0205  ? LYMPHSABS 1.8 04/19/2014 1227  ? MONOABS 2.0 (H) 11/17/2021 0205  ? MONOABS 0.8 04/19/2014  1227  ? EOSABS 0.0 11/17/2021 0205  ? EOSABS 0.0 04/19/2014 1227  ? BASOSABS 0.1 11/17/2021 0205  ? BASOSABS 0.1 04/19/2014 1227  ? ?CMP ?   ?Component Value Date/Time  ? NA 136 11/25/2021 0240  ? NA 136 04/19/2014 1227  ? K 3.6 11/25/2021 0240  ? K 3.5 04/19/2014 1227  ? CL 105 11/25/2021 0240  ? CL 102 04/19/2014 1227  ? CO2 23 11/25/2021 0240  ? CO2 26 04/19/2014 1227  ? GLUCOSE 106 (H) 11/25/2021 0240  ? GLUCOSE 131 (H) 04/19/2014 1227  ? BUN 19 11/25/2021 0240  ? BUN 21 (H) 04/19/2014 1227  ? CREATININE 1.16 (H) 11/25/2021 0240  ? CREATININE 1.09 04/19/2014 1227  ? CALCIUM 8.7 (L) 11/25/2021 0240  ? CALCIUM 8.6 04/19/2014 1227  ? PROT 7.5 11/16/2021 1725  ? PROT 8.1 04/19/2014 1227  ? ALBUMIN 3.9 11/16/2021 1725  ? ALBUMIN 4.0 04/19/2014 1227  ? AST 30  11/16/2021 1725  ? AST 26 04/19/2014 1227  ? ALT 20 11/16/2021 1725  ? ALT 71 (H) 04/19/2014 1227  ? ALKPHOS 56 11/16/2021 1725  ? ALKPHOS 65 04/19/2014 1227  ? BILITOT 1.0 11/16/2021 1725  ? BILITOT 0.6 04/19/2014 1227  ? GFRNONAA 47 (L) 11/25/2021 0240  ? GFRNONAA 50 (L) 04/19/2014 1227  ? GFRAA 58 (L) 04/19/2014 1227  ? ?COAGS ?Lab Results  ?Component Value Date  ? INR 1.1 11/16/2021  ? ?Lipid Panel ?   ?Component Value Date/Time  ? CHOL 99 11/17/2021 0205  ? TRIG 159 (H) 11/17/2021 0205  ? HDL 47 11/17/2021 0205  ? CHOLHDL 2.1 11/17/2021 0205  ? VLDL 32 11/17/2021 0205  ? [place] 20 11/17/2021 0205  ? ?HgbA1C  ?Lab Results  ?Component Value Date  ? HGBA1C 6.5 (H) 11/17/2021  ? ?Urinalysis ?   ?Component Value Date/Time  ? COLORURINE YELLOW (A) 11/16/2021 1624  ? APPEARANCEUR CLOUDY (A) 11/16/2021 1624  ? APPEARANCEUR Cloudy 04/19/2014 1227  ? LABSPEC 1.014 11/16/2021 1624  ? LABSPEC 1.022 04/19/2014 1227  ? PHURINE 7.0 11/16/2021 1624  ? GLUCOSEU NEGATIVE 11/16/2021 1624  ? GLUCOSEU Negative 04/19/2014 1227  ? HGBUR SMALL (A) 11/16/2021 1624  ? [place] NEGATIVE 11/16/2021 1624  ? BILIRUBINUR Negative 04/19/2014 1227  ? [place] NEGATIVE 11/16/2021 1624  ? PROTEINUR >=300 (A) 11/16/2021 1624  ? NITRITE NEGATIVE 11/16/2021 1624  ? LEUKOCYTESUR NEGATIVE 11/16/2021 1624  ? LEUKOCYTESUR Negative 04/19/2014 1227  ? ?Urine Drug Screen No results found for: LABOPIA, COCAINSCRNUR, [place], [place], THCU, LABBARB  ?Alcohol Level ?No results found for: ETH ? ? ?SIGNIFICANT DIAGNOSTIC STUDIES ?CT HEAD WO CONTRAST (5MM) ? ?Result Date: 11/17/2021 ?CLINICAL DATA:  Follow-up examination for acute stroke. EXAM: CT HEAD WITHOUT CONTRAST TECHNIQUE: Contiguous axial images were obtained from the base of the skull through the vertex without intravenous contrast. RADIATION DOSE REDUCTION: This exam was performed according to the departmental dose-optimization program which includes automated exposure control, adjustment of the  mA and/or kV according to patient size and/or use of iterative reconstruction technique. COMPARISON:  Prior CT and CTA from earlier the same day. FINDINGS: Brain: Age-related cerebral atrophy with chronic microvascular ischemic disease. Evolving left MCA territory infarct noted involving the left caudate. Diffuse contrast staining seen within the left temporal region, likely areas of evolving ischemia as well. No acute intracranial hemorrhage. No other new large vessel territory infarct. No mass lesion or midline shift. No hydrocephalus or extra-axial fluid collection. Vascular: Residual contrast material seen throughout the intracranial vasculature. Skull: Scalp  soft tissues and calvarium demonstrate no new abnormality. Sinuses/Orbits: Globes orbital soft tissues demonstrate no acute finding. Scattered mucosal thickening with air fluid levels noted within the paranasal sinuses. Patient is intubated. Mastoid air cells remain clear. Other: None. IMPRESSION: 1. Evolving left MCA territory infarct involving the left caudate. Diffuse contrast staining within the left temporal region, likely reflecting areas of evolving ischemia as well. No acute intracranial hemorrhage. 2. No other new acute intracranial abnormality. Electronically Signed   By: Jeannine Boga M.D.   On: 11/17/2021 01:31  ? ?MR BRAIN WO CONTRAST ? ?Addendum Date: 11/17/2021   ?ADDENDUM REPORT: 11/17/2021 18:10 ADDENDUM: These results will be called to the ordering clinician or representative by the Radiologist Assistant, and communication documented in the PACS or [employer]. Electronically Signed   By: Franchot Gallo M.D.   On: 11/17/2021 18:10  ? ?Result Date: 11/17/2021 ?CLINICAL DATA:  Stroke post thrombectomy left MCA. EXAM: MRI HEAD WITHOUT CONTRAST TECHNIQUE: Multiplanar, multiecho pulse sequences of the brain and surrounding structures were obtained without intravenous contrast. COMPARISON:  CT head 11/17/2021 FINDINGS: Brain: Acute  infarct in the left caudate and left putamen. This is similar to the original CT of 11/16/2021. Additional areas of infarct in the left lateral and posterior temporal lobe also similar to that seen on the prior C

## 2021-11-25 NOTE — Progress Notes (Addendum)
I called [facility] to give report. Frankey Poot, RN will call me back in 20 minutes to get report.  ? ?Spoke with Frankey Poot at [facility] and gave him report.  ?

## 2021-11-25 NOTE — TOC Transition Note (Signed)
Transition of Care (TOC) - CM/SW Discharge Note ? ? ?Patient Details  ?Name: Cassidy Hernandez ?MRN: [id number] ?Date of Birth: May 16, 1941 ? ?Transition of Care (TOC) CM/SW Contact:  ?Pollie Friar, RN ?Phone Number: ?11/25/2021, 10:16 AM ? ? ?Clinical Narrative:    ?Patient is discharging to [facility] rehab. Information faxed to [facility] and transport set up for 1 pm with [facility] per [facility] request. Daughter is aware of the plan. D/c packet is at the desk.  ? ?Number for report: [phone] ? ? ?Final next level of care: [place] ?Barriers to Discharge: No Barriers Identified ? ? ?Patient Goals and CMS Choice ?  ?CMS Medicare.gov Compare Post Acute Care list provided to:: Patient Represenative (must comment) ?Choice offered to / list presented to : Adult Children ? ?Discharge Placement ?  ?           ?  ?  ?  ?  ? ?Discharge Plan and Services ?  ?Discharge Planning Services: CM Consult ?Post Acute Care Choice: IP Rehab          ?  ?  ?  ?  ?  ?  ?  ?  ?  ?  ? ?Social Determinants of Health (SDOH) Interventions ?  ? ? ?Readmission Risk Interventions ?   ? View : No data to display.  ?  ?  ?  ? ? ? ? ? ?

## 2021-11-25 NOTE — Discharge Instructions (Addendum)
Ms. Idleman, you were admitted with aphasia and slurred speech.  Your symptoms were caused by a stroke, and you underwent mechanical thrombectomy with stent placement to treat this.  You will need to take aspirin and Brillinta for at least three months because of the stent.  Your symptoms have improved and you are now ready to be discharged to rehab.  You will need to follow up in the stroke clinic 6-8 weeks after discharge from the hospital. ?

## 2021-12-16 ENCOUNTER — Ambulatory Visit: Payer: Medicare Other | Attending: Nurse Practitioner | Admitting: Speech Pathology

## 2021-12-16 DIAGNOSIS — M6281 Muscle weakness (generalized): Secondary | ICD-10-CM | POA: Diagnosis present

## 2021-12-16 DIAGNOSIS — R2689 Other abnormalities of gait and mobility: Secondary | ICD-10-CM | POA: Diagnosis present

## 2021-12-16 DIAGNOSIS — R4701 Aphasia: Secondary | ICD-10-CM | POA: Insufficient documentation

## 2021-12-16 DIAGNOSIS — R269 Unspecified abnormalities of gait and mobility: Secondary | ICD-10-CM | POA: Insufficient documentation

## 2021-12-16 DIAGNOSIS — R278 Other lack of coordination: Secondary | ICD-10-CM | POA: Diagnosis present

## 2021-12-16 DIAGNOSIS — R296 Repeated falls: Secondary | ICD-10-CM | POA: Diagnosis present

## 2021-12-16 DIAGNOSIS — R471 Dysarthria and anarthria: Secondary | ICD-10-CM | POA: Diagnosis present

## 2021-12-16 DIAGNOSIS — G8191 Hemiplegia, unspecified affecting right dominant side: Secondary | ICD-10-CM | POA: Insufficient documentation

## 2021-12-16 DIAGNOSIS — R262 Difficulty in walking, not elsewhere classified: Secondary | ICD-10-CM | POA: Diagnosis present

## 2021-12-16 DIAGNOSIS — R2681 Unsteadiness on feet: Secondary | ICD-10-CM | POA: Diagnosis present

## 2021-12-16 NOTE — Therapy (Signed)
?[facility] ?[place], [place], [address] ?Phone: [phone]   Fax:  [phone] ? ?Speech Language Pathology Evaluation ? ?Patient Details  ?Name: Cassidy [facility] ?MRN: [id number] ?Date of Birth: [birth year] ?Referring Provider (SLP): Merrilee Seashore, NP ? ? ?Encounter Date: 12/16/2021 ? ? End of Session - 12/16/21 1829   ? ? Visit Number 1   ? Number of Visits 25   ? Date for SLP Re-Evaluation 03/16/22   ? Authorization Type BCBS MCR; $10 copay   ? Authorization - Visit Number 1   ? Progress Note Due on Visit 10   ? SLP Start Time 1600   ? SLP Stop Time  1705   ? SLP Time Calculation (min) 65 min   ? Activity Tolerance Patient tolerated treatment well   ? ?  ?  ? ?  ? ? ?Past Medical History:  ?Diagnosis Date  ? Anginal pain ([place])   ? Aortic atherosclerosis   ? Cancer [facility])   ? 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 a.) Doppler 25/36/6440: 34% LICA and 74% RICA. b.) Doppler 25/95/6387: >56% LICA and 43% RICA. c.) Doppler 10/25/16: >70% Bilater ICAs  ? Chronic bilateral low back pain with left-sided sciatica   ? CKD (chronic kidney disease), stage III ([place])   ? Coronary artery disease   ? a.) LHC 12/24/2014 -> small LAD system; diffuse CTO of LAD. 60% RCA. LM and LCx with no sig disease. no intervention; med mgmt.  ? Degenerative arthritis   ? Diastolic dysfunction   ? a.) TTE 09/20/2018: EF 55%, G1DD, mild LAE and RVE, triv-mild panvalvular regurgitation.  ? Dysarthria   ? GERD (gastroesophageal reflux disease)   ? Hepatic steatosis   ? History of kidney stones   ? HLD (hyperlipidemia)   ? Hypertension   ? Kidney stone   ? Lumbar radiculopathy   ? Major depression in remission [facility])   ? PVD (peripheral vascular disease) ([place])   ? Renal cyst, right 06/02/2015  ? a.) CT 06/02/2015 --> 4.4 cm RIGHT renal mass; MRI recommended. b.) MRI 06/16/2015 --> 3.8 x 4.2 x 4.0 cm proteinaceous/hemmorhagic cyst.  ? Subclavian steal syndrome   ? a.) LHC 12/24/2014 -->  tight eccentric 90% ostial stenosis with damping.  ? T2DM (type 2 diabetes mellitus) ([place])   ? ? ?Past Surgical History:  ?Procedure Laterality Date  ? ABDOMINAL HYSTERECTOMY    ? AUGMENTATION MAMMAPLASTY Bilateral 1980  ? BACK SURGERY  09/2018  ? BICEPT TENODESIS  06/10/2021  ? Procedure: BICEPS TENODESIS;  Surgeon: Corky Mull, MD;  Location: [facility] ORS;  Service: Orthopedics;;  ? CARDIAC CATHETERIZATION Left 12/24/2014  ? Procedure: LEFT HEARD CATHETERIZATION; Location: [facility]; Surgeon: Laurence Aly, MD  ? CHOLECYSTECTOMY    ? COLONOSCOPY N/A 02/11/2016  ? Procedure: COLONOSCOPY;  Surgeon: Manya Silvas, MD;  Location[facility] ENDOSCOPY;  Service: Endoscopy;  Laterality: N/A;  ? IR CT HEAD LTD  11/16/2021  ? IR CT HEAD LTD  11/16/2021  ? IR INTRAVSC STENT CERV CAROTID W/O EMB-PROT MOD SED INC ANGIO  11/18/2021  ? IR PERCUTANEOUS ART THROMBECTOMY/INFUSION INTRACRANIAL INC DIAG ANGIO  11/16/2021  ? RADIOLOGY WITH ANESTHESIA N/A 11/16/2021  ? Procedure: IR WITH ANESTHESIA- CODE STROKE;  Surgeon: Radiologist, Medication, MD;  Location: [place];  Service: Radiology;  Laterality: N/A;  ? REVERSE SHOULDER ARTHROPLASTY Right 06/10/2021  ? Procedure: REVERSE SHOULDER ARTHROPLASTY;  Surgeon: Corky Mull, MD;  Location: [facility] ORS;  Service:  Orthopedics;  Laterality: Right;  ? ? ?There were no vitals filed for this visit. ? ? Subjective Assessment - 12/16/21 1826   ? ? Subjective Arrives with daughter   ? Patient is accompained by: Family member   [name]  ? Currently in Pain? No/denies   ? ?  ?  ? ?  ? ? ? ? ? SLP Evaluation [facility] - 12/16/21 1830   ? ?  ? SLP Visit Information  ? SLP Received On 12/16/21   ? Referring Provider (SLP) Merrilee Seashore, NP   ? Onset Date 11/16/21   ? Medical Diagnosis L MCA CVA   ? ?  ?  ? ?  ? ? ?SUBJECTIVE:  ? ? ?PERTINENT HISTORY: Pt is a 81 y.o. 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 Patient is s/p thrombectomy with left ICA stent placement 11/16/21. She completed  inpatient rehabilitation at [facility] 11/25/21-12/07/21 and was discharged home with family. Patient known to this clinic from prior course of voice therapy in 2018. Pt with documented hx of voice and speech changes (dysarthria) per videostroboscopy 12/26/17; did not attend therapy.  ? ?PAIN:  ?Are you having pain? No ? ?FALLS: Has patient fallen in last 6 months?  See PT evaluation for details ? ?LIVING ENVIRONMENT: ?Lives with: lives with their family and plans to return to living independently with intermittent supervision next week ?Lives in: House/apartment ? ?PLOF:  ?Level of assistance: Independent with ADLs, Independent with IADLs ?Employment: Retired ? ? ?PATIENT GOALS enjoy reading again ? ?OBJECTIVE:  ? ?DIAGNOSTIC FINDINGS: MRI Brain 11/17/21: Acute left MCA infarct involving the left caudate, left putamen, left temporal lobe. Mild petechial hemorrhage associated with the infarct ? ?COGNITION: ?Overall cognitive status: Not formally assessed due to extent of language impairment ? ?AUDITORY COMPREHENSION: ?Overall auditory comprehension: Impaired: moderately complex ?YES/NO questions: Impaired: complex ?Following directions: Impaired: moderately complex ?Conversation: Moderately Complex ?Interfering components: n/a ?Effective technique: extra processing time, pausing, slowed speech, written cues, stressing words, and visual/gestural cues ? ? ?READING COMPREHENSION: Impaired: sentence ? ?EXPRESSION: verbal ? ?VERBAL EXPRESSION: ?Level of generative/spontaneous verbalization: sentence ?Automatic speech: name: intact and social response: intact  ?Repetition: Impaired: phrase and sentence ?Naming: Confrontation: 51-75% ?Pragmatics: Appears intact ?Interfering components: speech intelligibility ?Effective technique: sentence completion, articulatory cues, and melodic intonation ?Non-verbal means of communication: N/A ? ?WRITTEN EXPRESSION: ?Dominant hand: right ? Written expression: Impaired: word ? ?MOTOR  SPEECH: ?Overall motor speech: impaired ?Level of impairment: Sentence ?Respiration: reduced breath support ?Phonation: weak, hoarse ?Resonance: WFL ?Articulation: Impaired: sentence ?Intelligibility: Intelligibility reduced ?Motor planning: will assess further ?Motor speech errors: aware ?Interfering components: n/a ? ? ? ?STANDARDIZED ASSESSMENTS: ?WAB-R Part II ? ?Reading:  ?A. Comprehension of Sentences   18/40 ?B. Reading Commands   20/20 ?C. Written word-object choice matching 6/6 ?D. Written word-picture choice matching 6/6 ?E. Picture-written word choice matching 6/6 ?F. Spoken word-written choice matching 4/4 ?G. Letter discrimination   6/6 ?H. Spelled word recognition   4/4 ?I.  Spelling     6/6 ?Reading Total     76/100 ? ?Writing: ?  A. Writing upon request   6/6 ?  B. Writing output    10/34 ?  C. Writing to dictation    7/10 ?  D. Writing dictated words   9/10 ?  E. Alphabet and numbers   TBD ?  F. Dictated Letters and numbers  TBD ?  G. Copying a sentence   8.5/10 ?  Writing Total:  TBD ? ? PATIENT REPORTED OUTCOME MEASURES (PROM): ?The Communication Effectiveness Survey is a patient-reported outcome measure in which the patient rates their own effectiveness in different communication situations. A higher score indicates greater effectiveness. Pt's self-rating was 11/32. ? ? ? ? ? SLP Short Term Goals - 12/16/21 1921   ? ?  ? SLP SHORT TERM GOAL #1  ? Title Pt will communicate emergency information 100% accuracy using visual aid if necessary.   ? Time 6   ? Period Weeks   ? Status New   ? Target Date 01/27/22   ?  ? SLP SHORT TERM GOAL #2  ? Title Pt will generate at least 4 descriptors of target word 80% of the time using semantic feature analysis to improve abilities in wordfinding and resolving communication breakdowns.   ? Time 6   ? Period Weeks   ? Status New   ? Target Date 01/27/22   ?  ? SLP SHORT TERM GOAL #3  ? Title Patient will request repetition/rephrasing to aid auditory  comprehension of mod complex information (multi-step commands, conversation) in 80% of opportunities.   ? Time 6   ? Period Weeks   ? Status New   ? Target Date 01/27/22   ?  ? SLP SHORT TERM GOAL #4  ? Title Pt will read and reply to

## 2021-12-20 ENCOUNTER — Ambulatory Visit: Payer: Medicare Other

## 2021-12-20 ENCOUNTER — Ambulatory Visit: Payer: Medicare Other | Admitting: Speech Pathology

## 2021-12-20 ENCOUNTER — Encounter: Payer: Medicare Other | Admitting: Speech Pathology

## 2021-12-20 DIAGNOSIS — M6281 Muscle weakness (generalized): Secondary | ICD-10-CM

## 2021-12-20 DIAGNOSIS — R2681 Unsteadiness on feet: Secondary | ICD-10-CM

## 2021-12-20 DIAGNOSIS — R4701 Aphasia: Secondary | ICD-10-CM

## 2021-12-20 DIAGNOSIS — R471 Dysarthria and anarthria: Secondary | ICD-10-CM

## 2021-12-20 NOTE — Therapy (Addendum)
Cassidy Hernandez ?[facility] ?[place], [place], [address] ?Phone: [phone]   Fax:  [phone] ? ?Speech Language Pathology Treatment ? ?Patient Details  ?Name: Cassidy [facility] ?MRN: [id number] ?Date of Birth: Sep 28, 1940 ?Referring Provider (SLP): Cassidy Seashore, NP ? ? ?Encounter Date: 12/20/2021 ? ? End of Session - 12/20/21 1318   ? ? Visit Number 2   ? Number of Visits 25   ? Date for SLP Re-Evaluation 03/16/22   ? Authorization Type BCBS MCR; $10 copay   ? Authorization - Visit Number 2   ? Progress Note Due on Visit 10   ? SLP Start Time 1100   ? SLP Stop Time  1200   ? SLP Time Calculation (min) 60 min   ? Activity Tolerance Patient tolerated treatment well   ? ?  ?  ? ?  ? ? ?Past Medical History:  ?Diagnosis Date  ? Anginal pain ([place])   ? Aortic atherosclerosis   ? Cancer [facility])   ? 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 a.) Doppler 02/40/9735: 32% LICA and 99% RICA. b.) Doppler 24/26/8341: >96% LICA and 22% RICA. c.) Doppler 10/25/16: >70% Bilater ICAs  ? Chronic bilateral low back pain with left-sided sciatica   ? CKD (chronic kidney disease), stage III ([place])   ? Coronary artery disease   ? a.) LHC 12/24/2014 -> small LAD system; diffuse CTO of LAD. 60% RCA. LM and LCx with no sig disease. no intervention; med mgmt.  ? Degenerative arthritis   ? Diastolic dysfunction   ? a.) TTE 09/20/2018: EF 55%, G1DD, mild LAE and RVE, triv-mild panvalvular regurgitation.  ? Dysarthria   ? GERD (gastroesophageal reflux disease)   ? Hepatic steatosis   ? History of kidney stones   ? HLD (hyperlipidemia)   ? Hypertension   ? Kidney stone   ? Lumbar radiculopathy   ? Major depression in remission [facility])   ? PVD (peripheral vascular disease) ([place])   ? Renal cyst, right 06/02/2015  ? a.) CT 06/02/2015 --> 4.4 cm RIGHT renal mass; MRI recommended. b.) MRI 06/16/2015 --> 3.8 x 4.2 x 4.0 cm proteinaceous/hemmorhagic cyst.  ? Subclavian steal syndrome   ? a.) LHC 12/24/2014 --> tight  eccentric 90% ostial stenosis with damping.  ? T2DM (type 2 diabetes mellitus) ([place])   ? ? ?Past Surgical History:  ?Procedure Laterality Date  ? ABDOMINAL HYSTERECTOMY    ? AUGMENTATION MAMMAPLASTY Bilateral 1980  ? BACK SURGERY  09/2018  ? BICEPT TENODESIS  06/10/2021  ? Procedure: BICEPS TENODESIS;  Surgeon: Cassidy Mull, MD;  Location: [facility] ORS;  Service: Orthopedics;;  ? CARDIAC CATHETERIZATION Left 12/24/2014  ? Procedure: LEFT HEARD CATHETERIZATION; Location: [facility]; Surgeon: Cassidy Aly, MD  ? CHOLECYSTECTOMY    ? COLONOSCOPY N/A 02/11/2016  ? Procedure: COLONOSCOPY;  Surgeon: Cassidy Silvas, MD;  Location: [facility] ENDOSCOPY;  Service: Endoscopy;  Laterality: N/A;  ? IR CT HEAD LTD  11/16/2021  ? IR CT HEAD LTD  11/16/2021  ? IR INTRAVSC STENT CERV CAROTID W/O EMB-PROT MOD SED INC ANGIO  11/18/2021  ? IR PERCUTANEOUS ART THROMBECTOMY/INFUSION INTRACRANIAL INC DIAG ANGIO  11/16/2021  ? RADIOLOGY WITH ANESTHESIA N/A 11/16/2021  ? Procedure: IR WITH ANESTHESIA- CODE STROKE;  Surgeon: Radiologist, Medication, MD;  Location: [place];  Service: Radiology;  Laterality: N/A;  ? REVERSE SHOULDER ARTHROPLASTY Right 06/10/2021  ? Procedure: REVERSE SHOULDER ARTHROPLASTY;  Surgeon: Cassidy Mull, MD;  Location: [facility] ORS;  Service:  Orthopedics;  Laterality: Right;  ? ? ?There were no vitals filed for this visit. ? ? Subjective Assessment - 12/20/21 1317   ? ? Subjective "Nothing." (re: yesterday)   ? Patient is accompained by: Family member   daughter Cassidy Hernandez and sister  ? Currently in Pain? No/denies   ? ?  ?  ? ?  ? ? ? ? ? ? ? ? ADULT SLP TREATMENT - 12/20/21 1317   ? ?  ? General Information  ? Behavior/Cognition Alert;Cooperative;Pleasant mood   ? HPI Pt is a 81 y.o. 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 Patient is s/p thrombectomy with left ICA stent placement 11/16/21. She completed inpatient rehabilitation at [facility] 11/25/21-12/07/21 and was discharged home with family. Patient known to this clinic  from prior course of voice therapy in 2018. Pt with documented hx of voice and speech changes (dysarthria) per videostroboscopy 12/26/17; did not attend therapy.   ?  ? Treatment Provided  ? Treatment provided Cognitive-Linquistic   ?  ? Pain Assessment  ? Pain Assessment No/denies pain   ?  ? Cognitive-Linquistic Treatment  ? Treatment focused on Aphasia;Patient/family/caregiver education   ? Skilled Treatment Completed WAB Part II; see score table below. Supported conversation re: mother's day. Pt initially stated "nothing," however with encouragement was able to share that she went out to eat. Used visual aids and written keywords to encourage/facilitate pt sharing additional details. Demo'd tasks on [employer] [facility] and worked with pt to ID appropriate level tasks for home program. Pt appeared anxious initially but relaxed with task modification and use of cues for errorless learning. Sentence completion: level 1 50% (changed task to reduce anxiety). Answering questions (picture choice F:2) 100% accuracy. Introduced Chartered loss adjuster; with usual mod-max cues pt able to generate 1-3 descriptions for simple objects. Phonemic paraphasias noted frequently, however pt made several successful attempts to correct (Waddle/saddle, eg). Written keywords, phonemic cues, sentence completion were effective. Categorization was most difficult descriptor for pt. Pt ID'd simple category for 3 objects with written choice F:3 90% accuracy.   ?  ? Assessment / Recommendations / Plan  ? Plan Continue with current plan of care   ?  ? Progression Toward Goals  ? Progression toward goals Progressing toward goals   ? ?  ?  ? ?  ? ? ?WAB-R Part II ? ?Reading: ?  ?A Comprehension of Sentences    18/40  ?B Reading Commands 20/20  ?C Written word-object choice matching      6/6  ?D Written word-picture choice matching 6/6  ?E Picture-written word choice matching  6/6  ?F Spoken word-written choice matching 4/4  ?G Letter  discrimination    6/6  ?H Spelled word recognition   4/4  ?I Spelling 6/6  ?                                              ?Reading Total                                                  76/100 ?  ?Writing: ? ?A Writing upon request   6/6  ?B Writing output  10/34  ?C Writing  to dictation  7/10  ?D Writing dictated words 9/10  ?E Alphabet and numbers     15.5/22.5  ?F Dictated Letters and numbers  7.5/7.5  ?G Copying a sentence  8.5/10  ?                                 ?                        Writing Total:                                                   63.5/100 ? ? ?Spontaneous Speech Score  14/20  ?Auditory Verbal Comprehension Score 15.3/20  ?Repetition Score 4.6/10  ?Naming and Wordfinding Score 5.8/10  ?Reading Score 15.2/20  ?Writing Score 12.7/20  ?Language Quotient 67.6 ? ? SLP Education - 12/20/21 1318   ? ? Person(s) Educated Patient;Child(ren)   ? Methods Explanation;Demonstration   ? Comprehension Verbalized understanding;Need further instruction   ? ?  ?  ? ?  ? ? ? SLP Short Term Goals - 12/16/21 1921   ? ?  ? SLP SHORT TERM GOAL #1  ? Title Pt will communicate emergency information 100% accuracy using visual aid if necessary.   ? Time 6   ? Period Weeks   ? Status New   ? Target Date 01/27/22   ?  ? SLP SHORT TERM GOAL #2  ? Title Pt will generate at least 4 descriptors of target word 80% of the time using semantic feature analysis to improve abilities in wordfinding and resolving communication breakdowns.   ? Time 6   ? Period Weeks   ? Status New   ? Target Date 01/27/22   ?  ? SLP SHORT TERM GOAL #3  ? Title Patient will request repetition/rephrasing to aid auditory comprehension of mod complex information (multi-step commands, conversation) in 80% of opportunities.   ? Time 6   ? Period Weeks   ? Status New   ? Target Date 01/27/22   ?  ? SLP SHORT TERM GOAL #4  ? Title Pt will read and reply to simple texts/emails 80% accuracy with use of accessibility features as necessary.   ? Time 6   ?  Period Weeks   ? Status New   ? Target Date 01/27/22   ? ?  ?  ? ?  ? ? ? SLP Long Term Goals - 12/16/21 1926   ? ?  ? SLP LONG TERM GOAL #1  ? Title Pt will engage in 5-8 minutes simple-mod complex conversation r

## 2021-12-21 ENCOUNTER — Ambulatory Visit: Payer: Medicare Other

## 2021-12-21 DIAGNOSIS — R2681 Unsteadiness on feet: Secondary | ICD-10-CM

## 2021-12-21 DIAGNOSIS — R296 Repeated falls: Secondary | ICD-10-CM

## 2021-12-21 DIAGNOSIS — R4701 Aphasia: Secondary | ICD-10-CM | POA: Diagnosis not present

## 2021-12-21 NOTE — Therapy (Signed)
[place] [facility] ?[place], [place], [address] ?Phone: [phone]   Fax:  [phone] ? ?Occupational Therapy Evaluation ? ?Patient Details  ?Name: Cassidy Hernandez ?MRN: [id number] ?Date of Birth: May 15, 1941 ?Referring Provider (OT): Dr. Frazier Richards ? ? ?Encounter Date: 12/20/2021 ? ? OT End of Session - 12/21/21 0925   ? ? Visit Number 1   ? Number of Visits 24   ? Date for OT Re-Evaluation 03/13/22   ? OT Start Time 1300   ? OT Stop Time 1345   ? OT Time Calculation (min) 45 min   ? Equipment Utilized During Treatment 4 wheeled walker   ? Activity Tolerance Patient tolerated treatment well   ? Behavior During Therapy [facility] for tasks assessed/performed   ? ?  ?  ? ?  ? ? ?Past Medical History:  ?Diagnosis Date  ? Anginal pain ([place])   ? Aortic atherosclerosis   ? Cancer [facility])   ? 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 a.) Doppler 96/22/2979: 89% LICA and 21% RICA. b.) Doppler 19/41/7408: >14% LICA and 48% RICA. c.) Doppler 10/25/16: >70% Bilater ICAs  ? Chronic bilateral low back pain with left-sided sciatica   ? CKD (chronic kidney disease), stage III ([place])   ? Coronary artery disease   ? a.) LHC 12/24/2014 -> small LAD system; diffuse CTO of LAD. 60% RCA. LM and LCx with no sig disease. no intervention; med mgmt.  ? Degenerative arthritis   ? Diastolic dysfunction   ? a.) TTE 09/20/2018: EF 55%, G1DD, mild LAE and RVE, triv-mild panvalvular regurgitation.  ? Dysarthria   ? GERD (gastroesophageal reflux disease)   ? Hepatic steatosis   ? History of kidney stones   ? HLD (hyperlipidemia)   ? Hypertension   ? Kidney stone   ? Lumbar radiculopathy   ? Major depression in remission [facility])   ? PVD (peripheral vascular disease) ([place])   ? Renal cyst, right 06/02/2015  ? a.) CT 06/02/2015 --> 4.4 cm RIGHT renal mass; MRI recommended. b.) MRI 06/16/2015 --> 3.8 x 4.2 x 4.0 cm proteinaceous/hemmorhagic cyst.  ? Subclavian steal syndrome   ? a.) LHC 12/24/2014  --> tight eccentric 90% ostial stenosis with damping.  ? T2DM (type 2 diabetes mellitus) ([place])   ? ? ?Past Surgical History:  ?Procedure Laterality Date  ? ABDOMINAL HYSTERECTOMY    ? AUGMENTATION MAMMAPLASTY Bilateral 1980  ? BACK SURGERY  09/2018  ? BICEPT TENODESIS  06/10/2021  ? Procedure: BICEPS TENODESIS;  Surgeon: Corky Mull, MD;  Location: [facility] ORS;  Service: Orthopedics;;  ? CARDIAC CATHETERIZATION Left 12/24/2014  ? Procedure: LEFT HEARD CATHETERIZATION; Location: [facility]; Surgeon: Laurence Aly, MD  ? CHOLECYSTECTOMY    ? COLONOSCOPY N/A 02/11/2016  ? Procedure: COLONOSCOPY;  Surgeon: Manya Silvas, MD;  Location: [facility] ENDOSCOPY;  Service: Endoscopy;  Laterality: N/A;  ? IR CT HEAD LTD  11/16/2021  ? IR CT HEAD LTD  11/16/2021  ? IR INTRAVSC STENT CERV CAROTID W/O EMB-PROT MOD SED INC ANGIO  11/18/2021  ? IR PERCUTANEOUS ART THROMBECTOMY/INFUSION INTRACRANIAL INC DIAG ANGIO  11/16/2021  ? RADIOLOGY WITH ANESTHESIA N/A 11/16/2021  ? Procedure: IR WITH ANESTHESIA- CODE STROKE;  Surgeon: Radiologist, Medication, MD;  Location: [facility];  Service: Radiology;  Laterality: N/A;  ? REVERSE SHOULDER ARTHROPLASTY Right 06/10/2021  ? Procedure: REVERSE SHOULDER ARTHROPLASTY;  Surgeon: Corky Mull, MD;  Location: [facility] ORS;  Service: Orthopedics;  Laterality: Right;  ? ? ?  There were no vitals filed for this visit. ? ? Subjective Assessment - 12/21/21 0922   ? ? Subjective  Pt reports she will be staying at her home tonight for the first time since her stroke but her daughter will be staying over the next few days to make sure she does ok.   ? Pertinent History Recent L MCA, s/p thrombectomy and ICA stenting   ? Limitations aphasia, balance deficits, generalized weakness, hx of falls.   ? Patient Stated Goals Return to being indep and walk without AD   ? Currently in Pain? No/denies   ? Pain Score 0-No pain   ? ?  ?  ? ?  ? 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  Pt underwent emergency thrombectomy and L ICA stenting.  Pt completed inpatient rehab at [facility].  Prior to CVA, pt was living alone, indep with all ADL/IADL tasks, able to drive, and amb indep without AD.  Pt presents with global aphasia, generalized weakness, balance impairment requiring rollator, hx of 4 falls in the last 6 mo (pt estimates), hx of R rotator cuff repair in 11/22.  Pt has been staying with family since CVA, but will return to her own home this evening with plans for daughter to stay with her over the next several days to ensure safety and ease transition back home.  OT focus will be on HEP instruction, dynamic standing balance, tub transfer training, fall prevention, medication management, working toward return to PLOF with ADL/IADL tasks with LRD.  Pt/daughter in agreement with poc.  ? ? ? ? ? ? [facility] OT Assessment - 12/21/21 0001   ? ?  ? Assessment  ? Medical Diagnosis CVA   ? Referring Provider (OT) Dr. Frazier Richards   ? Onset Date/Surgical Date 11/25/21   ? Hand Dominance Right   ? Prior Therapy [facility] inpatient rehab   ?  ? Precautions  ? Precautions Fall   ?  ? Balance Screen  ? Has the patient fallen in the past 6 months Yes   ? How many times? 4   ? Has the patient had a decrease in activity level because of a fear of falling?  Yes   ? Is the patient reluctant to leave their home because of a fear of falling?  No   ?  ? Home  Environment  ? Family/patient expects to be discharged to: Private residence   ? Living Arrangements Alone   ? Available Help at Discharge Family   Daughter planning to stay with pt over the next few days as pt adjusts to being back home  ? Type of Home House   ? Home Access Level entry   steps in the front but no steps in the back  ? Bathroom Shower/Tub Tub/Shower unit   ? Bathroom Toilet Handicapped height   ? [place] - 4 wheels;Shower seat;Grab bars - tub/shower   ? Lives With Alone   ?  ? Prior Function  ? Level of Independence Independent    ? Vocation Retired   ? Leisure Go to [employer], going for car rides   ?  ? ADL  ? Toilet Transfer Modified independent   ? Tub/Shower Transfer Moderate assistance   ? ADL comments pt performing basic ADLs with distant supv with rollator   ?  ? IADL  ? Prior Level of Function Shopping indep   ? Light  Housekeeping --   baseline pt pays someone weekly to clean house  ? Meal Prep Needs to have meals prepared and served   ? Prior Level of Function Medication Managment indep   ? Medication Management Is not capable of dispensing or managing own medication   daughter assists  ?  ? Mobility  ? Mobility Status History of falls   ? Mobility Status Comments using rollator but prior to CVA pt did not require use of AD   ?  ? Written Expression  ? Dominant Hand Right   ? Handwriting 100% legible   ? Written Experience Within Functional Limits   ?  ? Vision - History  ? Baseline Vision Wears glasses all the time   ? Additional Comments Pt reports not changes from baseline since CVA   ?  ? Vision Assessment  ? Ocular Range of Motion Within Functional Limits   ? Tracking/Visual Pursuits Able to track stimulus in all quads without difficulty   ?  ? Cognition  ? Overall Cognitive Status Within Functional Limits for tasks assessed   ? Cognition Comments global aphasia; extra time needed to process commands and formulate a response   ?  ? Observation/Other Assessments  ? Focus on Therapeutic Outcomes (FOTO)  32   score likely scewed; OT estimates score should be higher based on objective testing.  Likely inaccurate responses d/t aphasia.  ?  ? Sensation  ? Light Touch Appears Intact   ?  ? Coordination  ? Gross Motor Movements are Fluid and Coordinated Yes   ? Fine Motor Movements are Fluid and Coordinated Yes   ? Coordination and Movement Description BUEs are fairly symmetrical   ? Finger Nose Finger Test intact   ? Right 9 Hole Peg Test 33 sec   ? Left 9 Hole Peg Test 32 sec   ?  ? AROM  ? Overall AROM Comments Pt with hx of R rotator  cuff repair in Nov of 2022; R shoulder slightly limited in flex/abd compared to L but still BUEs are [facility] for ADLs   ?  ? Strength  ? Overall Strength Comments R shoulder 4/5, L 4+/5 (hx of rotator cu

## 2021-12-21 NOTE — Therapy (Signed)
[place] ?[place], [place], [address] ?Phone: [phone]   Fax:  [phone] ? ?Physical Therapy Evaluation ? ?Patient Details  ?Name: Cassidy Hernandez ?MRN: [id number] ?Date of Birth: [birth year] ?Referring Provider (PT): Merrilee Seashore, PA-C ? ? ?Encounter Date: 12/21/2021 ? ? PT End of Session - 12/21/21 1339   ? ? Visit Number 1   ? Number of Visits 24   ? Date for PT Re-Evaluation 03/15/22   ? Authorization Type BCBS Medicare   ? Authorization Time Period 12/21/21-03/15/22   ? Progress Note Due on Visit 10   ? PT Start Time 1020   ? PT Stop Time 1100   ? PT Time Calculation (min) 40 min   ? Equipment Utilized During Treatment Gait belt   ? Activity Tolerance Patient tolerated treatment well;No increased pain   ? Behavior During Therapy [facility] for tasks assessed/performed   ? ?  ?  ? ?  ? ? ?Past Medical History:  ?Diagnosis Date  ? Anginal pain ([place])   ? Aortic atherosclerosis   ? Cancer [facility]   ? 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 a.) Doppler 37/05/6268: 48% LICA and 54% RICA. b.) Doppler 62/70/3500: >93% LICA and 81% RICA. c.) Doppler 10/25/16: >70% Bilater ICAs  ? Chronic bilateral low back pain with left-sided sciatica   ? CKD (chronic kidney disease), stage III ([place])   ? Coronary artery disease   ? a.) LHC 12/24/2014 -> small LAD system; diffuse CTO of LAD. 60% RCA. LM and LCx with no sig disease. no intervention; med mgmt.  ? Degenerative arthritis   ? Diastolic dysfunction   ? a.) TTE 09/20/2018: EF 55%, G1DD, mild LAE and RVE, triv-mild panvalvular regurgitation.  ? Dysarthria   ? GERD (gastroesophageal reflux disease)   ? Hepatic steatosis   ? History of kidney stones   ? HLD (hyperlipidemia)   ? Hypertension   ? Kidney stone   ? Lumbar radiculopathy   ? Major depression in remission [facility])   ? PVD (peripheral vascular disease) ([place])   ? Renal cyst, right 06/02/2015  ? a.) CT 06/02/2015 --> 4.4 cm RIGHT renal mass; MRI recommended. b.) MRI  06/16/2015 --> 3.8 x 4.2 x 4.0 cm proteinaceous/hemmorhagic cyst.  ? Subclavian steal syndrome   ? a.) LHC 12/24/2014 --> tight eccentric 90% ostial stenosis with damping.  ? T2DM (type 2 diabetes mellitus) ([place])   ? ? ?Past Surgical History:  ?Procedure Laterality Date  ? ABDOMINAL HYSTERECTOMY    ? AUGMENTATION MAMMAPLASTY Bilateral 1980  ? BACK SURGERY  09/2018  ? BICEPT TENODESIS  06/10/2021  ? Procedure: BICEPS TENODESIS;  Surgeon: Corky Mull, MD;  Location: [facility] ORS;  Service: Orthopedics;;  ? CARDIAC CATHETERIZATION Left 12/24/2014  ? Procedure: LEFT HEARD CATHETERIZATION; Location: [facility]; Surgeon: Laurence Aly, MD  ? CHOLECYSTECTOMY    ? COLONOSCOPY N/A 02/11/2016  ? Procedure: COLONOSCOPY;  Surgeon: Manya Silvas, MD;  Location: [facility] ENDOSCOPY;  Service: Endoscopy;  Laterality: N/A;  ? IR CT HEAD LTD  11/16/2021  ? IR CT HEAD LTD  11/16/2021  ? IR INTRAVSC STENT CERV CAROTID W/O EMB-PROT MOD SED INC ANGIO  11/18/2021  ? IR PERCUTANEOUS ART THROMBECTOMY/INFUSION INTRACRANIAL INC DIAG ANGIO  11/16/2021  ? RADIOLOGY WITH ANESTHESIA N/A 11/16/2021  ? Procedure: IR WITH ANESTHESIA- CODE STROKE;  Surgeon: Radiologist, Medication, MD;  Location: [place];  Service: Radiology;  Laterality: N/A;  ? REVERSE SHOULDER ARTHROPLASTY Right 06/10/2021  ?  Procedure: REVERSE SHOULDER ARTHROPLASTY;  Surgeon: Corky Mull, MD;  Location: [facility] ORS;  Service: Orthopedics;  Laterality: Right;  ? ? ?There were no vitals filed for this visit. ? ? ? ? ? ? ? [facility] PT Assessment - 12/21/21 1327   ? ?  ? Assessment  ? Medical Diagnosis CVA   ? Referring Provider (PT) Merrilee Seashore, PA-C   ? Onset Date/Surgical Date 11/25/21   ? Hand Dominance Right   ? Prior Therapy [facility] inpatient rehab   ?  ? Precautions  ? Precautions Fall   ?  ? Balance Screen  ? Has the patient fallen in the past 6 months Yes   ? How many times? 4   ? Has the patient had a decrease in activity level because of a fear of falling?  No   ? Is the patient reluctant to leave their  home because of a fear of falling?  Yes   ?  ? Prior Function  ? Level of Independence Independent   ? Vocation Retired   ? Leisure Go to [employer], going for car rides   ?  ? Observation/Other Assessments  ? Focus on Therapeutic Outcomes (FOTO)  7   ?  ? Sensation  ? Light Touch Appears Intact   assessment of BLE from knees down, normal, symetrical per patient  ?  ? ROM / Strength  ? AROM / PROM / Strength Strength   ?  ? Strength  ? Overall Strength Comments Ankle DF: Rt 4+/5, Lt 4/5; Ankle PF 5/5 manually resisted   ?  ? Flexibility  ? Soft Tissue Assessment /Muscle Length yes   ? Hamstrings Calves   both feel restrictive to DF ROM active adn passive  ?  ? Transfers  ? Five time sit to stand comments  24 sec   hands free  ?  ? Ambulation/Gait  ? Ambulation Distance (Feet) 150 Feet   4WW around gym loop  ? Assistive device 4-wheeled walker   ? Gait Pattern Decreased stance time - left   ? Gait velocity 0.74ms   ? Gait velocity - backwards 135f very anxious about falling, 3 big retropulsive LOB require max-totalA from author for righting    ? Pre-Gait Activities able to perform [id number] stepping bilat slowly and cautiously   ?  ? Standardized Balance Assessment  ? Standardized Balance Assessment Berg Balance Test   ?  ? Berg Balance Test  ? Sit to Stand Able to stand without using hands and stabilize independently   ? Standing Unsupported Able to stand 2 minutes with supervision   ? Sitting with Back Unsupported but Feet Supported on Floor or Stool Able to sit safely and securely 2 minutes   ? Stand to Sit Sits safely with minimal use of hands   ? Transfers Able to transfer safely, minor use of hands   ? Turn 360 Degrees Needs assistance while turning    ? Standing Unsupported, Alternately Place Feet on Step/Stool Able to complete >2 steps/needs minimal assist    ? Standing Unsupported, One Foot in [place] while stepping or standing    ? Standing on One Leg Unable to try or needs assist to prevent  fall    ? ?  ?  ? ?  ? ? ? ? ? ? ? ? ? Vestibular Assessment - 12/21/21 0001   ? ?  ? Orthostatics  ? BP sitting 135/88   ? HR sitting 89   ?  BP standing (after 1 minute) 119/71    no symptoms  ? HR standing (after 1 minute) 96   ? BP standing (after 3 minutes) 124/74   standing after ~4-5 minutes walking/stepping assessment; no symptoms  ? ?  ?  ? ?  ? ? ? ? ? ?Objective measurements completed on examination: See above findings.  ? ? ? ? ? ? ? ? ? ? ? ? ? ? PT Education - 12/21/21 1339   ? ? Education provided Yes   ? Education Details roll of anterior compartment in preventing retropulsion   ? Person(s) Educated Patient   ? Methods Explanation   ? Comprehension Verbalized understanding;Need further instruction   ? ?  ?  ? ?  ? ? ? PT Short Term Goals - 12/21/21 1508   ? ?  ? PT SHORT TERM GOAL #1  ? Title Independence in HEP for improved balance and strength   ? Baseline Issued at eval   ? Time 3   ? Period Weeks   ? Status New   ? Target Date 01/11/22   ?  ? PT SHORT TERM GOAL #2  ? Title 5xSTS hands free <19sec   ? Baseline Eval: >24sec hands free   ? Time 4   ? Period Weeks   ? Status New   ? Target Date 01/18/22   ?  ? PT SHORT TERM GOAL #3  ? Title Ankle DF MMT 5/5 bilat.   ? Baseline Eval: 4+/5 Right, 4/5 Left   ? Time 4   ? Period Weeks   ? Status New   ? Target Date 01/18/22   ? ?  ?  ? ?  ? ? ? ? PT Long Term Goals - 12/21/21 1509   ? ?  ? PT LONG TERM GOAL #1  ? Title 6MWT > 1298f c LRAD   ? Baseline July 2019: 13224f  ? Time 8   ? Period Weeks   ? Status New   ? Target Date 02/15/22   ?  ? PT LONG TERM GOAL #2  ? Title 5xSTS <13 sec hands free   ? Baseline eval: >24sec hands free   ? Time 8   ? Period Weeks   ? Status New   ? Target Date 02/15/22   ?  ? PT LONG TERM GOAL #3  ? Title Pt to demonstrate retroAMB without device >0.2567ms LOB to demonstrate improved ankle righting and control.   ? Baseline Eval: <0.68m77mnd c 3 LOB   ? Time 10   ? Period Weeks   ? Status New   ? Target Date 03/01/22   ?   ? PT LONG TERM GOAL #4  ? Title FOTO Score increase >15   ? Baseline eval: 48   ? Time 12   ? Period Weeks   ? Status New   ? Target Date 03/15/22   ? ?  ?  ? ?  ? ? ? ? ? ? ? ? ? Plan - 12/21/21 1501

## 2021-12-23 ENCOUNTER — Ambulatory Visit: Payer: Medicare Other

## 2021-12-23 DIAGNOSIS — R278 Other lack of coordination: Secondary | ICD-10-CM

## 2021-12-23 DIAGNOSIS — M6281 Muscle weakness (generalized): Secondary | ICD-10-CM

## 2021-12-23 DIAGNOSIS — R296 Repeated falls: Secondary | ICD-10-CM

## 2021-12-23 DIAGNOSIS — R2681 Unsteadiness on feet: Secondary | ICD-10-CM

## 2021-12-23 DIAGNOSIS — R4701 Aphasia: Secondary | ICD-10-CM | POA: Diagnosis not present

## 2021-12-23 NOTE — Therapy (Signed)
[place] MAIN [facility] SERVICES [address] [place], [place], [address] Phone: [phone]   Fax:  [phone]  Physical Therapy Treatment  Patient Details  Name: Cassidy Hernandez MRN: [id number] Date of Birth: [birth year] Referring Provider (PT): Merrilee Seashore, PA-C   Encounter Date: 12/23/2021   PT End of Session - 12/23/21 1306     Visit Number 2    Number of Visits 24    Date for PT Re-Evaluation 03/15/22    Authorization Type BCBS Medicare    Authorization Time Period 12/21/21-03/15/22    Progress Note Due on Visit 10    PT Start Time 1300    PT Stop Time 1340    PT Time Calculation (min) 40 min    Equipment Utilized During Treatment Gait belt    Activity Tolerance Patient tolerated treatment well;No increased pain    Behavior During Therapy [facility] for tasks assessed/performed             Past Medical History:  Diagnosis Date   Anginal pain ([place])    Aortic atherosclerosis    Cancer ([place])    Carotid artery stenosis 02/01/2010   a.) Doppler 98/92/1194: 17% LICA and 40% RICA. b.) Doppler 81/44/8185: >63% LICA and 14% RICA. c.) Doppler 10/25/16: >70% Bilater ICAs   Chronic bilateral low back pain with left-sided sciatica    CKD (chronic kidney disease), stage III (HCC)    Coronary artery disease    a.) LHC 12/24/2014 -> small LAD system; diffuse CTO of LAD. 60% RCA. LM and LCx with no sig disease. no intervention; med mgmt.   Degenerative arthritis    Diastolic dysfunction    a.) TTE 09/20/2018: EF 55%, G1DD, mild LAE and RVE, triv-mild panvalvular regurgitation.   Dysarthria    GERD (gastroesophageal reflux disease)    Hepatic steatosis    History of kidney stones    HLD (hyperlipidemia)    Hypertension    Kidney stone    Lumbar radiculopathy    Major depression in remission ([place])    PVD (peripheral vascular disease) ([place])    Renal cyst, right 06/02/2015   a.) CT 06/02/2015 --> 4.4 cm RIGHT renal mass; MRI recommended. b.) MRI  06/16/2015 --> 3.8 x 4.2 x 4.0 cm proteinaceous/hemmorhagic cyst.   Subclavian steal syndrome    a.) LHC 12/24/2014 --> tight eccentric 90% ostial stenosis with damping.   T2DM (type 2 diabetes mellitus) [facility])     Past Surgical History:  Procedure Laterality Date   ABDOMINAL HYSTERECTOMY     AUGMENTATION MAMMAPLASTY Bilateral 1980   BACK SURGERY  09/2018   BICEPT TENODESIS  06/10/2021   Procedure: BICEPS TENODESIS;  Surgeon: Corky Mull, MD;  Location: [place] [facility];  Service: Orthopedics;;   CARDIAC CATHETERIZATION Left 12/24/2014   Procedure: LEFT HEARD CATHETERIZATION; Location: [facility]; Surgeon: Laurence Aly, MD   CHOLECYSTECTOMY     COLONOSCOPY N/A 02/11/2016   Procedure: COLONOSCOPY;  Surgeon: Manya Silvas, MD;  Location: [facility];  Service: Endoscopy;  Laterality: N/A;   IR CT HEAD LTD  11/16/2021   IR CT HEAD LTD  11/16/2021   IR INTRAVSC STENT CERV CAROTID W/O EMB-PROT MOD SED INC ANGIO  11/18/2021   IR PERCUTANEOUS ART THROMBECTOMY/INFUSION INTRACRANIAL INC DIAG ANGIO  11/16/2021   RADIOLOGY WITH ANESTHESIA N/A 11/16/2021   Procedure: IR WITH ANESTHESIA- CODE STROKE;  Surgeon: Radiologist, Medication, MD;  Location: [place];  Service: Radiology;  Laterality: N/A;   REVERSE SHOULDER ARTHROPLASTY Right 06/10/2021  Procedure: REVERSE SHOULDER ARTHROPLASTY;  Surgeon: Corky Mull, MD;  Location: [facility];  Service: Orthopedics;  Laterality: Right;    There were no vitals filed for this visit.   Subjective Assessment - 12/23/21 1306     Subjective Pt doing well today in general. She reports she felt fine after her last PT sesssion.    Pertinent History Patient is a 81 year old female with LBP that she reports radiates down the L hip and reports the radiating pain is "electrical" burning and shooting. 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 Patient reports pain has been going on for 20 years, but has been exacerbated  over the past 3-4 months after lifting a sofa. Patient reports she is scheduled for an MRI Monday for the back pain. Patient reports deficits in prolonged sitting and standing. Patient reports her worst pain in the past week 10/10 best: 0/10 at rest. Pt denies N/V, unexplained weight fluctuation, saddle paresthesia, fever, night sweats, or unrelenting night pain at this time.    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 No/denies             -NUSTEP AA/ROM seat 8?, arms 10, level 1 x 4 minutes *post Nustep vitals with HTN in 170s SBP 171/67mHg, 98% SpO2, 89bpm  -Overground AMB 3017f 4:21 c 4WW, no LOB -STS from chair+ airex  10x hands free (pretty high surface for 5'2") -seated Left DF full range 1x10: 2lb Left, 2.5lb Rt, minA for last 2 reps due to fatigue -STS from chair only 1x6 (difficulty with anterior weight shift due to tib anterior fatigue and calf tightness)  -seated Left DF full range 1x10: 2lb Left, 2.5lb Rt, minA for last 2 reps due to fatigue -Calf stretching at // bar on high rocker board -STS from chair x6 hands free (DRAMATIC improvement in ease after ankle calf stretching) WOW!  -backward AMB without device [id number] assist due to history of retropulsion    PT Education - 12/23/21 1310     Education provided Yes              PT Short Term Goals - 12/21/21 1508       PT SHORT TERM GOAL #1   Title Independence in HEP for improved balance and strength    Baseline Issued at eval    Time 3    Period Weeks    Status New    Target Date 01/11/22      PT SHORT TERM GOAL #2   Title 5xSTS hands free <19sec    Baseline Eval: >24sec hands free    Time 4    Period Weeks     Status New    Target Date 01/18/22      PT SHORT TERM GOAL #3   Title Ankle DF MMT 5/5 bilat.    Baseline Eval: 4+/5 Right, 4/5 Left    Time 4    Period Weeks    Status New    Target Date 01/18/22               PT Long Term Goals - 12/21/21 1509       PT LONG TERM GOAL #1  Title 6MWT > 1228f c LRAD    Baseline July 2019: 13228f   Time 8    Period Weeks    Status New    Target Date 02/15/22      PT LONG TERM GOAL #2   Title 5xSTS <13 sec hands free    Baseline eval: >24sec hands free    Time 8    Period Weeks    Status New    Target Date 02/15/22      PT LONG TERM GOAL #3   Title Pt to demonstrate retroAMB without device >0.255ms LOB to demonstrate improved ankle righting and control.    Baseline Eval: <0.16m37mnd c 3 LOB    Time 10    Period Weeks    Status New    Target Date 03/01/22      PT LONG TERM GOAL #4   Title FOTO Score increase >15    Baseline eval: 48    Time 12    Period Weeks    Status New    Target Date 03/15/22                   Plan - 12/23/21 1310     Clinical Impression Statement Transfers and tibialis anterior strength today. More frank demonstration of how tib ant weakness + calf tightness contribute to retroversion LOB and difficulty with anterior weight shift for STS transfer. First attempt at calf stretch show significant improvement in ease with STS. Finished with retroAMB which demons no LOB today unlike 2 days prior in zero drop footwear. Today pt is in a wedge with a~ 1 inch lift.     Personal Factors and Comorbidities Past/Current Experience    Examination-Activity Limitations Dressing;Transfers;Bend;Locomotion Level;Stairs;Stand    Examination-Participation Restrictions Cleaning;Shop;Meal Prep;YardValla LeaverkShadow Mountain Behavioral Health SystemStability/Clinical Decision Making Evolving/Moderate complexity    Clinical Decision Making Moderate    Rehab Potential Good    Clinical Impairments Affecting Rehab Potential (+) motivation, social  support (-) age, chronicity of pain, fairly sedentary lifestyle    PT Frequency 2x / week    PT Duration 12 weeks    PT Treatment/Interventions Aquatic Therapy;Moist Heat;Functional mobility training;Dry needling;Neuromuscular re-education;Traction;Ultrasound;Iontophoresis '4mg'$ /ml Dexamethasone;ADLs/Self Care Home Management;Cryotherapy;Electrical Stimulation;Therapeutic exercise;Therapeutic activities;Patient/family education;Taping;Gait training;Stair training;DME Instruction;Balance training;Passive range of motion    PT Next Visit Plan FU on HEP, finish BBT assessment    PT Home Exercise Plan Eval: seated heel raises, toe raises, and STS from elevated surface    Consulted and Agree with Plan of Care Patient;Family member/caregiver    Family Member Consulted DTR             Patient will benefit from skilled therapeutic intervention in order to improve the following deficits and impairments:  Decreased range of motion, Difficulty walking, Abnormal gait, Decreased balance, Decreased mobility, Decreased activity tolerance, Decreased knowledge of use of DME, Decreased strength, Dizziness  Visit Diagnosis: Repeated falls  Unsteadiness on feet  Muscle weakness (generalized)     Problem List Patient Active Problem List   Diagnosis Date Noted   Acute ischemic left MCA stroke (HCC)[place]/06/2022   Middle cerebral artery embolism, 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   Status post reverse arthroplasty of shoulder, right 06/10/2021   1:54 PM, 12/23/21 AllaEtta Grandchild, DPT Physical Therapist - [facility]- MainSekiu-[phone] [facility] ParkPT 12/23/2021, 1:28 PM  [facility] [address]  [place], [place], [address] Phone: [phone]   Fax:  [phone]  Name: Cassidy Hernandez MRN: [id number] Date of Birth: 1941/08/06

## 2021-12-24 NOTE — Therapy (Signed)
[place] MAIN [facility] SERVICES [address] [place], [place], [address] Phone: [phone]   Fax:  [phone]  Occupational Therapy Treatment  Patient Details  Name: Cassidy Hernandez MRN: [id number] Date of Birth: 08-27-1940 Referring Provider (OT): Dr. Frazier Richards   Encounter Date: 12/23/2021   OT End of Session - 12/24/21 1852     Visit Number 2    Number of Visits 24    Date for OT Re-Evaluation 03/13/22    OT Start Time 1345    OT Stop Time 1430    OT Time Calculation (min) 45 min    Equipment Utilized During Treatment 4 wheeled walker    Activity Tolerance Patient tolerated treatment well    Behavior During Therapy [facility] for tasks assessed/performed             Past Medical History:  Diagnosis Date   Anginal pain ([place])    Aortic atherosclerosis    Cancer ([place])    Carotid artery stenosis 02/01/2010   a.) Doppler 01/60/1093: 23% LICA and 55% RICA. b.) Doppler 73/22/0254: >27% LICA and 06% RICA. c.) Doppler 10/25/16: >70% Bilater ICAs   Chronic bilateral low back pain with left-sided sciatica    CKD (chronic kidney disease), stage III ([place])    Coronary artery disease    a.) LHC 12/24/2014 -> small LAD system; diffuse CTO of LAD. 60% RCA. LM and LCx with no sig disease. no intervention; med mgmt.   Degenerative arthritis    Diastolic dysfunction    a.) TTE 09/20/2018: EF 55%, G1DD, mild LAE and RVE, triv-mild panvalvular regurgitation.   Dysarthria    GERD (gastroesophageal reflux disease)    Hepatic steatosis    History of kidney stones    HLD (hyperlipidemia)    Hypertension    Kidney stone    Lumbar radiculopathy    Major depression in remission ([place])    PVD (peripheral vascular disease) ([place])    Renal cyst, right 06/02/2015   a.) CT 06/02/2015 --> 4.4 cm RIGHT renal mass; MRI recommended. b.) MRI 06/16/2015 --> 3.8 x 4.2 x 4.0 cm proteinaceous/hemmorhagic cyst.   Subclavian steal syndrome    a.) LHC 12/24/2014 -->  tight eccentric 90% ostial stenosis with damping.   T2DM (type 2 diabetes mellitus) [facility])     Past Surgical History:  Procedure Laterality Date   ABDOMINAL HYSTERECTOMY     AUGMENTATION MAMMAPLASTY Bilateral 1980   BACK SURGERY  09/2018   BICEPT TENODESIS  06/10/2021   Procedure: BICEPS TENODESIS;  Surgeon: Corky Mull, MD;  Location: [place] [facility];  Service: Orthopedics;;   CARDIAC CATHETERIZATION Left 12/24/2014   Procedure: LEFT HEARD CATHETERIZATION; Location: [facility]; Surgeon: Laurence Aly, MD   CHOLECYSTECTOMY     COLONOSCOPY N/A 02/11/2016   Procedure: COLONOSCOPY;  Surgeon: Manya Silvas, MD;  Location: [facility] ENDOSCOPY;  Service: Endoscopy;  Laterality: N/A;   IR CT HEAD LTD  11/16/2021   IR CT HEAD LTD  11/16/2021   IR INTRAVSC STENT CERV CAROTID W/O EMB-PROT MOD SED INC ANGIO  11/18/2021   IR PERCUTANEOUS ART THROMBECTOMY/INFUSION INTRACRANIAL INC DIAG ANGIO  11/16/2021   RADIOLOGY WITH ANESTHESIA N/A 11/16/2021   Procedure: IR WITH ANESTHESIA- CODE STROKE;  Surgeon: Radiologist, Medication, MD;  Location: [place];  Service: Radiology;  Laterality: N/A;   REVERSE SHOULDER ARTHROPLASTY Right 06/10/2021   Procedure: REVERSE SHOULDER ARTHROPLASTY;  Surgeon: Corky Mull, MD;  Location: [facility];  Service: Orthopedics;  Laterality: Right;  There were no vitals filed for this visit.   Subjective Assessment - 12/23/21 1850     Subjective  Pt reports that she has had family and caregiver staying with her during the day, but that she stayed by herself last night for the first time since her stroke and it went well.    Pertinent History Recent L MCA, s/p thrombectomy and ICA stenting    Limitations aphasia, balance deficits, generalized weakness, hx of falls.    Patient Stated Goals Return to being indep and walk without AD    Currently in Pain? No/denies    Pain Score 0-No pain    Pain Onset More than a month ago            Occupational Therapy Treatment: Therapeutic  Exercise: Issued pink theraputty and instructed pt in strengthening and coordination exercises for R/L hands, including gross grasping, lateral/2 point/3 point pinching, digit abd/add, and digging coins out of putty.  Able to return demo with min vc for technique to improve quality of movement and mod-max vc to follow handout that was issued.  Encouraged completion 5-10 min, 1-2x per day.    Therapeutic Activity: Participated in functional mobility and reaching for cones placed at various surface levels.  Pt able to reach in all directions outside BOS with min guard for reaching below knees, close SBA for overhead, forward, and lateral reaching always keeping 1 hand on rollator for support.  Pt required 1 cue for making sure to lock rollator brakes when using rollator to stabilize while reaching.  Pt required 1 cue for walker positioning to prevent stepping over wheel of walker, but rather pushing walker to side, when reaching laterally.    Self Care: Fall prevention reinforced, including making sure to wear non-skid footwear, turn lights on when up at night, keep pathways clear, keep walker at bedside, lock walker brakes when reaching when 1 hand is on the walker, make slow positional changes.  Pt also stated that family has obtained a tub transfer bench which she will start using when she showers next.  Pt verbalized understanding of all fall prevention strategies.   Response to Treatment: Pt was pleased to report that she stayed in her home over night last evening for the first time alone, and it went very well.  Pt has family or caregivers present during the day to assist as needed, but pt states she is helping with all aspects of self care tasks.  Pt tolerated all therapeutic exercises and activities well this day, and demonstrated good fall prevention strategies after initial cues were given for walker safety when reaching for cones.  Pt was able to verbalize a number of fall prevention strategies  that she and family use at home in order to reduce fall risk.  Pt will continue to benefit from skilled OT for HEP instruction, dynamic standing balance, tub transfer training, fall prevention, medication management, working toward return to PLOF with ADL/IADL tasks with LRD.    OT Education - 12/23/21 1851     Education Details theraputty exercises; walker safety with reaching activities    Person(s) Educated Patient    Methods Explanation;Verbal cues;Demonstration    Comprehension Verbalized understanding;Verbal cues required;Need further instruction;Returned demonstration              OT Short Term Goals - 12/21/21 0946       OT SHORT TERM GOAL #1   Title Pt will be indep with theraputty HEP for increasing bilat grip/pinch strength.  Baseline Eval: not yet initiated    Time 6    Period Weeks    Status New    Target Date 01/30/22      OT SHORT TERM GOAL #2   Title Pt will be indep to verbalize 3 fall prevention strategies for safe ADL completion.    Baseline Eval: not yet initiated (hx of 4 falls in the last 6 months)    Time 6    Period Weeks    Status New    Target Date 01/30/22               OT Long Term Goals - 12/21/21 0947       OT LONG TERM GOAL #1   Title Pt will increase FOTO score to 45 or better.    Baseline Eval: FOTO 32 (OT estimates this # should be higher based on objective testing; likely inaccurate answer selections d/t aphasia)    Time 12    Period Weeks    Status New    Target Date 03/13/22      OT LONG TERM GOAL #2   Title Pt will increase bilat grip strength by 5 or more lbs for improved ability to hold and carry ADL supplies.    Baseline Eval: R 23, L 25    Time 12    Period Weeks    Status New    Target Date 03/13/22      OT LONG TERM GOAL #3   Title Pt will perform tub transfer with modified indep.    Baseline Eval: min-mod A (FOF)    Time 12    Period Weeks    Status New    Target Date 03/13/22      OT LONG TERM GOAL #4    Title Pt will manage hot/cold meal prep with modified indep    Baseline Eval: family currently managing meals    Time 12    Period Weeks    Status New    Target Date 03/13/22      OT LONG TERM GOAL #5   Title Pt will manage medications with modified indep    Baseline Eval: Pt and daughter manage together since CVA    Time 1    Period Weeks    Status New    Target Date 03/13/22      Long Term Additional Goals   Additional Long Term Goals Yes      OT LONG TERM GOAL #6   Title Pt will safely reach in all directions and planes with unilateral to no UE support in order to perform ADL/IADLs with LRD.    Baseline Eval: Pt using rollator and has not yet attempted IADL tasks (family managing IADLs)    Time 27    Period Weeks    Status New    Target Date 03/13/22              Plan - 12/23/21 1905     Clinical Impression Statement Pt was pleased to report that she stayed in her home over night last evening for the first time alone, and it went very well.  Pt has family or caregivers present during the day to assist as needed, but pt states she is helping with all aspects of self care tasks.  Pt tolerated all therapeutic exercises and activities well this day, and demonstrated good fall prevention strategies after initial cues were given for walker safety when reaching for cones.  Pt was able to verbalize  a number of fall prevention strategies that she and family use at home in order to reduce fall risk.  Pt will continue to benefit from skilled OT for HEP instruction, dynamic standing balance, tub transfer training, fall prevention, medication management, working toward return to PLOF with ADL/IADL tasks with LRD.    OT Occupational Profile and History Problem Focused Assessment - Including review of records relating to presenting problem    Occupational performance deficits (Please refer to evaluation for details): ADL's;IADL's;Leisure;Social Participation    Body Structure / Function  / Physical Skills ADL;Coordination;Endurance;GMC;UE functional use;Balance;Decreased knowledge of precautions;IADL;Strength;Dexterity;FMC;Gait;Mobility    Cognitive Skills Understand    Rehab Potential Good    Clinical Decision Making Several treatment options, min-mod task modification necessary    Comorbidities Affecting Occupational Performance: None    Modification or Assistance to Complete Evaluation  Min-Moderate modification of tasks or assist with assess necessary to complete eval   extra time to process commands and formulate answers to questions d/t aphasia   OT Frequency 2x / week    OT Duration 12 weeks    OT Treatment/Interventions Self-care/ADL training;Therapeutic exercise;DME and/or AE instruction;Functional Mobility Training;Balance training;Neuromuscular education;Therapeutic activities;Patient/family education;Coping strategies training    Plan OT focus on UB strengthening, dynamic standing balance, IADLs, med management    OT Home Exercise Plan not yet initiated    Consulted and Agree with Plan of Care Patient;Family member/caregiver             Patient will benefit from skilled therapeutic intervention in order to improve the following deficits and impairments:   Body Structure / Function / Physical Skills: ADL, Coordination, Endurance, GMC, UE functional use, Balance, Decreased knowledge of precautions, IADL, Strength, Dexterity, FMC, Gait, Mobility Cognitive Skills: Understand     Visit Diagnosis: Muscle weakness (generalized)  Unsteadiness on feet  Other lack of coordination    Problem List Patient Active Problem List   Diagnosis Date Noted   Acute ischemic left MCA stroke ([place]) 11/16/2021   Middle cerebral artery embolism, 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

## 2021-12-27 ENCOUNTER — Ambulatory Visit: Payer: Medicare Other

## 2021-12-27 ENCOUNTER — Ambulatory Visit: Payer: Medicare Other | Admitting: Occupational Therapy

## 2021-12-27 ENCOUNTER — Encounter: Payer: Self-pay | Admitting: Occupational Therapy

## 2021-12-27 ENCOUNTER — Ambulatory Visit: Payer: Medicare Other | Admitting: Speech Pathology

## 2021-12-27 DIAGNOSIS — M6281 Muscle weakness (generalized): Secondary | ICD-10-CM

## 2021-12-27 DIAGNOSIS — R4701 Aphasia: Secondary | ICD-10-CM | POA: Diagnosis not present

## 2021-12-27 DIAGNOSIS — R278 Other lack of coordination: Secondary | ICD-10-CM

## 2021-12-27 NOTE — Therapy (Signed)
[place] MAIN [facility] SERVICES [address] [place], [place], [address] Phone: [phone]   Fax:  [phone]  Occupational Therapy Treatment  Patient Details  Name: Cassidy Hernandez MRN: [id number] Date of Birth: 1940-12-31 Referring Provider (OT): Dr. Frazier Richards   Encounter Date: 12/27/2021   OT End of Session - 12/27/21 1043     Visit Number 3    Number of Visits 24    Date for OT Re-Evaluation 03/13/22    OT Start Time 1015    OT Stop Time 1100    OT Time Calculation (min) 45 min    Activity Tolerance Patient tolerated treatment well    Behavior During Therapy [facility] for tasks assessed/performed             Past Medical History:  Diagnosis Date   Anginal pain ([place])    Aortic atherosclerosis    Cancer ([place])    Carotid artery stenosis 02/01/2010   a.) Doppler 95/18/8416: 60% LICA and 63% RICA. b.) Doppler 01/60/1093: >23% LICA and 55% RICA. c.) Doppler 10/25/16: >70% Bilater ICAs   Chronic bilateral low back pain with left-sided sciatica    CKD (chronic kidney disease), stage III ([place])    Coronary artery disease    a.) LHC 12/24/2014 -> small LAD system; diffuse CTO of LAD. 60% RCA. LM and LCx with no sig disease. no intervention; med mgmt.   Degenerative arthritis    Diastolic dysfunction    a.) TTE 09/20/2018: EF 55%, G1DD, mild LAE and RVE, triv-mild panvalvular regurgitation.   Dysarthria    GERD (gastroesophageal reflux disease)    Hepatic steatosis    History of kidney stones    HLD (hyperlipidemia)    Hypertension    Kidney stone    Lumbar radiculopathy    Major depression in remission ([place])    PVD (peripheral vascular disease) ([place])    Renal cyst, right 06/02/2015   a.) CT 06/02/2015 --> 4.4 cm RIGHT renal mass; MRI recommended. b.) MRI 06/16/2015 --> 3.8 x 4.2 x 4.0 cm proteinaceous/hemmorhagic cyst.   Subclavian steal syndrome    a.) LHC 12/24/2014 --> tight eccentric 90% ostial stenosis with damping.    T2DM (type 2 diabetes mellitus) [facility])     Past Surgical History:  Procedure Laterality Date   ABDOMINAL HYSTERECTOMY     AUGMENTATION MAMMAPLASTY Bilateral 1980   BACK SURGERY  09/2018   BICEPT TENODESIS  06/10/2021   Procedure: BICEPS TENODESIS;  Surgeon: Corky Mull, MD;  Location: [facility];  Service: Orthopedics;;   CARDIAC CATHETERIZATION Left 12/24/2014   Procedure: LEFT HEARD CATHETERIZATION; Location: [facility]; Surgeon: Laurence Aly, MD   CHOLECYSTECTOMY     COLONOSCOPY N/A 02/11/2016   Procedure: COLONOSCOPY;  Surgeon: Manya Silvas, MD;  Location: [facility];  Service: Endoscopy;  Laterality: N/A;   IR CT HEAD LTD  11/16/2021   IR CT HEAD LTD  11/16/2021   IR INTRAVSC STENT CERV CAROTID W/O EMB-PROT MOD SED INC ANGIO  11/18/2021   IR PERCUTANEOUS ART THROMBECTOMY/INFUSION INTRACRANIAL INC DIAG ANGIO  11/16/2021   RADIOLOGY WITH ANESTHESIA N/A 11/16/2021   Procedure: IR WITH ANESTHESIA- CODE STROKE;  Surgeon: Radiologist, Medication, MD;  Location: [place];  Service: Radiology;  Laterality: N/A;   REVERSE SHOULDER ARTHROPLASTY Right 06/10/2021   Procedure: REVERSE SHOULDER ARTHROPLASTY;  Surgeon: Corky Mull, MD;  Location: [facility];  Service: Orthopedics;  Laterality: Right;    There were no vitals filed for this visit.  Subjective Assessment - 12/27/21 1041     Subjective  Pt.'s sister provided transportation for the pt.    Pertinent History Recent L MCA, s/p thrombectomy and ICA stenting    Currently in Pain? No/denies            OT TREATMENT    Neuro muscular re-education:  Pt. worked on using her right hand for grasping 1", 3/4", and 1/2" washers magnetic hooks on a whiteboard positioned at a high vertical angle on the tabletop in standing. Pt. was further challenged with trunk rotation while reaching over to cross midline. Pt. required multiple seated rest breaks. Pt. worked on [facility] skills grasping 1/2" flat washers, and placing them on the hooks. Pt. worked on  right hand [facility] skills using the Building services engineer Task. Pt. worked on sustaining grasp on the resistive tweezers while grasping the sticks, and moving them from a horizontal position to a vertical position to prepare for placing them into the pegboard. Pt. required verbal cues, and cues for visual demonstration for wrist position, and hand pattern when placing them into the pegboard. Pt. performed [facility] tasks using the grooved pegboard. Pt. worked on grasping the grooved pegs from a horizontal position, and moving the pegs to a vertical position in the hand to prepare for placing them in the grooved slot.   Pt. fatigues quickly with standing tasks, and required multiple seated rest breaks. Pt. required cues to lift the right foot when walking down the hall to the therapy gym from the reception area. Pt. continues to present with limited balance during ADL tasks, limited activity tolerance for tasks in standing, and decreased [facility] skills. Pt. continues to work on improving strength, and [facility] skills in order to work towards improving, and maximizing independence with ADLs, and IADL tasks.                       OT Education - 12/27/21 1043     Education Details Balance, [facility] skills    Person(s) Educated Patient    Methods Explanation;Verbal cues;Demonstration    Comprehension Verbalized understanding;Verbal cues required;Need further instruction;Returned demonstration              OT Short Term Goals - 12/21/21 0946       OT SHORT TERM GOAL #1   Title Pt will be indep with theraputty HEP for increasing bilat grip/pinch strength.    Baseline Eval: not yet initiated    Time 6    Period Weeks    Status New    Target Date 01/30/22      OT SHORT TERM GOAL #2   Title Pt will be indep to verbalize 3 fall prevention strategies for safe ADL completion.    Baseline Eval: not yet initiated (hx of 4 falls in the last 6 months)    Time 6    Period Weeks    Status New    Target  Date 01/30/22               OT Long Term Goals - 12/21/21 0947       OT LONG TERM GOAL #1   Title Pt will increase FOTO score to 45 or better.    Baseline Eval: FOTO 32 (OT estimates this # should be higher based on objective testing; likely inaccurate answer selections d/t aphasia)    Time 12    Period Weeks    Status New    Target Date 03/13/22  OT LONG TERM GOAL #2   Title Pt will increase bilat grip strength by 5 or more lbs for improved ability to hold and carry ADL supplies.    Baseline Eval: R 23, L 25    Time 12    Period Weeks    Status New    Target Date 03/13/22      OT LONG TERM GOAL #3   Title Pt will perform tub transfer with modified indep.    Baseline Eval: min-mod A (FOF)    Time 12    Period Weeks    Status New    Target Date 03/13/22      OT LONG TERM GOAL #4   Title Pt will manage hot/cold meal prep with modified indep    Baseline Eval: family currently managing meals    Time 12    Period Weeks    Status New    Target Date 03/13/22      OT LONG TERM GOAL #5   Title Pt will manage medications with modified indep    Baseline Eval: Pt and daughter manage together since CVA    Time 80    Period Weeks    Status New    Target Date 03/13/22      Long Term Additional Goals   Additional Long Term Goals Yes      OT LONG TERM GOAL #6   Title Pt will safely reach in all directions and planes with unilateral to no UE support in order to perform ADL/IADLs with LRD.    Baseline Eval: Pt using rollator and has not yet attempted IADL tasks (family managing IADLs)    Time 12    Period Weeks    Status New    Target Date 03/13/22                   Plan - 12/27/21 1044     Clinical Impression Statement Pt. fatigues quickly with standing tasks, and required multiple rest breaks. Pt. required cues to lift the right foot when walking down the hall to the therapy gym from the reception area. Pt. continues to present with limited balance  during ADL tasks, limited activity tolerance for tasks in standing, and decreased [facility] skills. Pt. continues to work on improving strength, and [facility] skills in order to work towards improving, and maximizing independence with ADLs, and IADL tasks.    OT Occupational Profile and History Problem Focused Assessment - Including review of records relating to presenting problem    Occupational performance deficits (Please refer to evaluation for details): ADL's;IADL's;Leisure;Social Participation    Body Structure / Function / Physical Skills ADL;Coordination;Endurance;GMC;UE functional use;Balance;Decreased knowledge of precautions;IADL;Strength;Dexterity;FMC;Gait;Mobility    Cognitive Skills Understand    Rehab Potential Good    Clinical Decision Making Several treatment options, min-mod task modification necessary    Comorbidities Affecting Occupational Performance: None    Modification or Assistance to Complete Evaluation  Min-Moderate modification of tasks or assist with assess necessary to complete eval   extra time to process commands and formulate answers to questions d/t aphasia   OT Frequency 2x / week    OT Duration 12 weeks    OT Treatment/Interventions Self-care/ADL training;Therapeutic exercise;DME and/or AE instruction;Functional Mobility Training;Balance training;Neuromuscular education;Therapeutic activities;Patient/family education;Coping strategies training    Plan OT focus on UB strengthening, dynamic standing balance, IADLs, med management    OT Home Exercise Plan not yet initiated    Consulted and Agree with Plan of Care Patient;Family member/caregiver  Patient will benefit from skilled therapeutic intervention in order to improve the following deficits and impairments:   Body Structure / Function / Physical Skills: ADL, Coordination, Endurance, GMC, UE functional use, Balance, Decreased knowledge of precautions, IADL, Strength, Dexterity, FMC, Gait, Mobility Cognitive  Skills: Understand     Visit Diagnosis: Muscle weakness (generalized)  Other lack of coordination    Problem List Patient Active Problem List   Diagnosis Date Noted   Acute ischemic left MCA stroke ([place]) 11/16/2021   Middle cerebral artery embolism, 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   Status post reverse arthroplasty of shoulder, right 06/10/2021   Harrel Carina, MS, OTR/L   Harrel Carina, OT 12/27/2021, 10:47 AM  [place] [address] [place], [place], [address] Phone: [phone]   Fax:  [phone]  Name: Cassidy Hernandez MRN: [id number] Date of Birth: 08-10-1940

## 2021-12-27 NOTE — Therapy (Signed)
[place] MAIN [facility] SERVICES [address] [place], [place], [address] Phone: [phone]   Fax:  [phone]  Speech Language Pathology Treatment  Patient Details  Name: Cassidy Hernandez MRN: [id number] Date of Birth: October 28, 1940 Referring Provider (SLP): Merrilee Seashore, NP   Encounter Date: 12/27/2021   End of Session - 12/27/21 1320     Visit Number 3    Number of Visits 25    Date for SLP Re-Evaluation 03/16/22    Authorization Type [employer] MCR; $10 copay    Authorization - Visit Number 3    Progress Note Due on Visit 10    SLP Start Time 1100    SLP Stop Time  1200    SLP Time Calculation (min) 60 min    Activity Tolerance Patient tolerated treatment well             Past Medical History:  Diagnosis Date   Anginal pain ([place])    Aortic atherosclerosis    Cancer ([place])    Carotid artery stenosis 02/01/2010   a.) Doppler 02/72/5366: 44% LICA and 03% RICA. b.) Doppler 47/42/5956: >38% LICA and 75% RICA. c.) Doppler 10/25/16: >70% Bilater ICAs   Chronic bilateral low back pain with left-sided sciatica    CKD (chronic kidney disease), stage III ([place])    Coronary artery disease    a.) LHC 12/24/2014 -> small LAD system; diffuse CTO of LAD. 60% RCA. LM and LCx with no sig disease. no intervention; med mgmt.   Degenerative arthritis    Diastolic dysfunction    a.) TTE 09/20/2018: EF 55%, G1DD, mild LAE and RVE, triv-mild panvalvular regurgitation.   Dysarthria    GERD (gastroesophageal reflux disease)    Hepatic steatosis    History of kidney stones    HLD (hyperlipidemia)    Hypertension    Kidney stone    Lumbar radiculopathy    Major depression in remission ([place])    PVD (peripheral vascular disease) ([place])    Renal cyst, right 06/02/2015   a.) CT 06/02/2015 --> 4.4 cm RIGHT renal mass; MRI recommended. b.) MRI 06/16/2015 --> 3.8 x 4.2 x 4.0 cm proteinaceous/hemmorhagic cyst.   Subclavian steal syndrome    a.) LHC 12/24/2014 --> tight  eccentric 90% ostial stenosis with damping.   T2DM (type 2 diabetes mellitus) [facility])     Past Surgical History:  Procedure Laterality Date   ABDOMINAL HYSTERECTOMY     AUGMENTATION MAMMAPLASTY Bilateral 1980   BACK SURGERY  09/2018   BICEPT TENODESIS  06/10/2021   Procedure: BICEPS TENODESIS;  Surgeon: Corky Mull, MD;  Location: [facility];  Service: Orthopedics;;   CARDIAC CATHETERIZATION Left 12/24/2014   Procedure: LEFT HEARD CATHETERIZATION; Location: [facility]; Surgeon: Laurence Aly, MD   CHOLECYSTECTOMY     COLONOSCOPY N/A 02/11/2016   Procedure: COLONOSCOPY;  Surgeon: Manya Silvas, MD;  Location: [facility];  Service: Endoscopy;  Laterality: N/A;   IR CT HEAD LTD  11/16/2021   IR CT HEAD LTD  11/16/2021   IR INTRAVSC STENT CERV CAROTID W/O EMB-PROT MOD SED INC ANGIO  11/18/2021   IR PERCUTANEOUS ART THROMBECTOMY/INFUSION INTRACRANIAL INC DIAG ANGIO  11/16/2021   RADIOLOGY WITH ANESTHESIA N/A 11/16/2021   Procedure: IR WITH ANESTHESIA- CODE STROKE;  Surgeon: Radiologist, Medication, MD;  Location: [place];  Service: Radiology;  Laterality: N/A;   REVERSE SHOULDER ARTHROPLASTY Right 06/10/2021   Procedure: REVERSE SHOULDER ARTHROPLASTY;  Surgeon: Corky Mull, MD;  Location: [facility];  Service:  Orthopedics;  Laterality: Right;    There were no vitals filed for this visit.   Subjective Assessment - 12/27/21 1301     Subjective "I need to talk more."    Currently in Pain? No/denies                   ADULT SLP TREATMENT - 12/27/21 1301       General Information   Behavior/Cognition Alert;Cooperative;Pleasant mood    HPI Pt is a 81 y.o. 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 Patient is s/p thrombectomy with left ICA stent placement 11/16/21. She completed inpatient [facility] at [facility] 11/25/21-12/07/21 and was discharged home with family. Patient known to this clinic from prior course of voice therapy in 2018. Pt with documented hx of voice  and speech changes (dysarthria) per videostroboscopy 12/26/17; did not attend therapy.      Treatment Provided   Treatment provided Cognitive-Linquistic      Cognitive-Linquistic Treatment   Treatment focused on Aphasia;Patient/family/caregiver education    Skilled Treatment SLP facilitated simple conversation re: hobbies and interests. Pt experienced anomia [place]) when discussing [employer]. Pt generated 3 descriptions using semantic feature analysis with usual mod cues (drives a lot", "retired" "wears a hat"). Pt brought iPad with [employer] downloaded ([employer] Therapy not compatible with software edition). Pt previously enjoyed reading (books and news) but has not been able to do this post-CVA. Demonstrated using multimodal input to facilitate comprehension (written, auditory) for simple news articles. Pt subsequently answered comprehension questions re: news articles of choice 66% accuracy, 100% with mod-max cues. Pt reports wanting to "talk more," SLP provided education re: [employer] groups and sent information to pt's daughter after the session.      Assessment / Recommendations / Plan   Plan Continue with current plan of care      Progression Toward Goals   Progression toward goals Progressing toward goals              SLP Education - 12/27/21 1319     Education Details [employer] project referral process    Person(s) Educated Patient;Child(ren)    Methods Explanation    Comprehension Verbalized understanding              SLP Short Term Goals - 12/16/21 1921       SLP SHORT TERM GOAL #1   Title Pt will communicate emergency information 100% accuracy using visual aid if necessary.    Time 6    Period Weeks    Status New    Target Date 01/27/22      SLP SHORT TERM GOAL #2   Title Pt will generate at least 4 descriptors of target word 80% of the time using semantic feature analysis to improve abilities in wordfinding and resolving  communication breakdowns.    Time 6    Period Weeks    Status New    Target Date 01/27/22      SLP SHORT TERM GOAL #3   Title Patient will request repetition/rephrasing to aid auditory comprehension of mod complex information (multi-step commands, conversation) in 80% of opportunities.    Time 6    Period Weeks    Status New    Target Date 01/27/22      SLP SHORT TERM GOAL #4   Title Pt will read and reply to simple texts/emails 80% accuracy with use of accessibility features as necessary.    Time 6  Period Weeks    Status New    Target Date 01/27/22              SLP Long Term Goals - 12/16/21 1926       SLP LONG TERM GOAL #1   Title Pt will engage in 5-8 minutes simple-mod complex conversation re: topic of interest with supported conversation, aphasia compensations.    Time 12    Period Weeks    Status New    Target Date 03/16/22      SLP LONG TERM GOAL #2   Title Pt will ID and attempt repair of communication breakdowns >80% of the time in session.    Time 12    Period Weeks    Status New    Target Date 03/16/22      SLP LONG TERM GOAL #3   Title Pt will demonstrate reading comprehension of mod complex 8-10 paragraphs by generating summary or answering questions >80% accuracy.    Time 12    Period Weeks    Status New              Plan - 12/27/21 1320     Clinical Impression Statement Patient presents with moderate conduction aphasia. Pt also has written expression impairments at word level and reading comprehension impairments at sentence level. Verbal expression is characterized by significant wordfinding difficulty, hesitation, revisions, verbal and phonemic paraphasias. Auditory comprehension is impaired for complex questions; pt benefits from written and visual cues and rephrasing. Fluency improved today in simple conversation; pt appears less anxious than in prior sessions. With mod cues pt able to use semantic feature analysis during communication  breakdown. Patient expresses desire to improve communication with family and friends and to resume hobbies and interests such as reading and watching TV. I recommend skilled ST to improve pt's communication skills to increase safety, independence and quality of life.    Speech Therapy Frequency 2x / week    Duration 2 weeks    Potential to Achieve Goals Good    Potential Considerations Family/community support    SLP Home Exercise Plan Talk[employer] sign-up and tasks provided    Consulted and Agree with Plan of Care Patient;Family member/caregiver             Patient will benefit from skilled therapeutic intervention in order to improve the following deficits and impairments:   Aphasia    Problem List Patient Active Problem List   Diagnosis Date Noted   Acute ischemic left MCA stroke ([place]) 11/16/2021   Middle cerebral artery embolism, 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   Status post reverse arthroplasty of shoulder, right 06/10/2021   Deneise Lever, MS, CCC-SLP Speech-Language Pathologist [phone]   Aliene Altes, [place] 12/27/2021, 1:28 PM  [place] MAIN [facility] SERVICES [address] [place], [place], [address] Phone: [phone]   Fax:  [phone]   Name: Cassidy Hernandez MRN: [id number] Date of Birth: 23-Dec-1940

## 2021-12-28 ENCOUNTER — Ambulatory Visit: Payer: Medicare Other

## 2021-12-28 VITALS — BP 161/62 | HR 82

## 2021-12-28 DIAGNOSIS — R4701 Aphasia: Secondary | ICD-10-CM | POA: Diagnosis not present

## 2021-12-28 DIAGNOSIS — R296 Repeated falls: Secondary | ICD-10-CM

## 2021-12-28 DIAGNOSIS — R278 Other lack of coordination: Secondary | ICD-10-CM

## 2021-12-28 DIAGNOSIS — R471 Dysarthria and anarthria: Secondary | ICD-10-CM

## 2021-12-28 DIAGNOSIS — M6281 Muscle weakness (generalized): Secondary | ICD-10-CM

## 2021-12-28 DIAGNOSIS — R2681 Unsteadiness on feet: Secondary | ICD-10-CM

## 2021-12-28 NOTE — Therapy (Signed)
[place] MAIN [facility] SERVICES [address] [place], [place], [address] Phone: [phone]   Fax:  [phone]  Physical Therapy Treatment  Patient Details  Name: Cassidy Hernandez MRN: [id number] Date of Birth: 07/16/1941 Referring Provider (PT): Merrilee Seashore, PA-C   Encounter Date: 12/28/2021   PT End of Session - 12/28/21 1634     Visit Number 3    Number of Visits 24    Date for PT Re-Evaluation 03/15/22    Authorization Type BCBS Medicare    Authorization Time Period 12/21/21-03/15/22    Progress Note Due on Visit 10    PT Start Time 1600    PT Stop Time 1640    PT Time Calculation (min) 40 min    Equipment Utilized During Treatment Gait belt    Activity Tolerance Patient tolerated treatment well;No increased pain    Behavior During Therapy [facility] for tasks assessed/performed             Past Medical History:  Diagnosis Date   Anginal pain ([place])    Aortic atherosclerosis    Cancer ([place])    Carotid artery stenosis 02/01/2010   a.) Doppler 27/78/2423: 53% LICA and 61% RICA. b.) Doppler 44/31/5400: >86% LICA and 76% RICA. c.) Doppler 10/25/16: >70% Bilater ICAs   Chronic bilateral low back pain with left-sided sciatica    CKD (chronic kidney disease), stage III (HCC)    Coronary artery disease    a.) LHC 12/24/2014 -> small LAD system; diffuse CTO of LAD. 60% RCA. LM and LCx with no sig disease. no intervention; med mgmt.   Degenerative arthritis    Diastolic dysfunction    a.) TTE 09/20/2018: EF 55%, G1DD, mild LAE and RVE, triv-mild panvalvular regurgitation.   Dysarthria    GERD (gastroesophageal reflux disease)    Hepatic steatosis    History of kidney stones    HLD (hyperlipidemia)    Hypertension    Kidney stone    Lumbar radiculopathy    Major depression in remission ([place])    PVD (peripheral vascular disease) ([place])    Renal cyst, right 06/02/2015   a.) CT 06/02/2015 --> 4.4 cm RIGHT renal mass; MRI recommended. b.) MRI  06/16/2015 --> 3.8 x 4.2 x 4.0 cm proteinaceous/hemmorhagic cyst.   Subclavian steal syndrome    a.) LHC 12/24/2014 --> tight eccentric 90% ostial stenosis with damping.   T2DM (type 2 diabetes mellitus) [facility])     Past Surgical History:  Procedure Laterality Date   ABDOMINAL HYSTERECTOMY     AUGMENTATION MAMMAPLASTY Bilateral 1980   BACK SURGERY  09/2018   BICEPT TENODESIS  06/10/2021   Procedure: BICEPS TENODESIS;  Surgeon: Corky Mull, MD;  Location: [place] [facility];  Service: Orthopedics;;   CARDIAC CATHETERIZATION Left 12/24/2014   Procedure: LEFT HEARD CATHETERIZATION; Location: [facility]; Surgeon: Laurence Aly, MD   CHOLECYSTECTOMY     COLONOSCOPY N/A 02/11/2016   Procedure: COLONOSCOPY;  Surgeon: Manya Silvas, MD;  Location: [facility] ENDOSCOPY;  Service: Endoscopy;  Laterality: N/A;   IR CT HEAD LTD  11/16/2021   IR CT HEAD LTD  11/16/2021   IR INTRAVSC STENT CERV CAROTID W/O EMB-PROT MOD SED INC ANGIO  11/18/2021   IR PERCUTANEOUS ART THROMBECTOMY/INFUSION INTRACRANIAL INC DIAG ANGIO  11/16/2021   RADIOLOGY WITH ANESTHESIA N/A 11/16/2021   Procedure: IR WITH ANESTHESIA- CODE STROKE;  Surgeon: Radiologist, Medication, MD;  Location: [place];  Service: Radiology;  Laterality: N/A;   REVERSE SHOULDER ARTHROPLASTY Right 06/10/2021  Procedure: REVERSE SHOULDER ARTHROPLASTY;  Surgeon: Corky Mull, MD;  Location: [facility];  Service: Orthopedics;  Laterality: Right;    Vitals:   12/28/21 1608 12/28/21 1609  BP: (!) 182/68 (!) 161/62  Pulse: 84 82     Subjective Assessment - 12/28/21 1610     Subjective Pt here on wrong day for PT but is willing to wait a bit and be fit into schedule. Pt report sfeeling good today, no updates since last session. No pain today    Pertinent History Pt Recent L MCA, s/p thrombectomy and ICA stenting. Pt with speech deficits. Pt has been somewhat unsteady with a few falls falling backward however DTR reports this is not new, but has been ongoing. Pt seen at our [facility] in 2018 fo rlow back pain issues in the setting of spintal stenosis.    Currently in Pain? No/denies             INTERVENTION THIS DATE:  -NUSTEP AA/ROM seat 7, arms 10, level 1 x 4 minutes *post Nustep vitals  182/68 84 post nustep  161/62 82  2 mintes post nustep  159/67 85 standing 3 minutes post   -Bilat calf stretch on rocker board in //bars 3x30sec, tactile cues for form  -Full rang eAP rocker board 1x20, BUE support   -STS from chair+ airex 8x hands free (high surface for 5'2") *anterior compartment fatigue after 4 reps with decreased ability to perform anterior weight shift without repeated retro fall into chair, requires modA for weight shift  -STS from chair x8 hands free (DRAMATIC improvement with 1.5lb weights under heels)   -backward AMB without device x40f minGuard assist due to history of retropulsion *has recurrent retropulsion LOB after fatigue in ankles, modA required for LOB *Again notable difference in balance control, STS, retro AMB being in zero drop shoes today      PT Education - 12/28/21 1638     Education provided No    Education Details zero drop shoes are causing retro LOB    Person(s) Educated Patient    Methods Explanation;Demonstration    Comprehension Verbalized understanding              PT Short Term Goals - 12/21/21 1508       PT SHORT TERM GOAL #1   Title Independence in HEP for improved balance and strength    Baseline Issued at eval    Time 3    Period Weeks    Status New    Target Date 01/11/22      PT SHORT TERM GOAL #2   Title 5xSTS hands free <19sec    Baseline Eval: >24sec hands free    Time 4    Period Weeks    Status New    Target Date 01/18/22      PT SHORT TERM GOAL #3   Title Ankle DF MMT 5/5 bilat.    Baseline Eval: 4+/5 Right, 4/5 Left    Time 4    Period Weeks    Status New    Target Date 01/18/22               PT Long Term Goals - 12/21/21 1509       PT LONG TERM GOAL #1    Title 6MWT > 1208fc LRAD    Baseline July 2019: 132020f  Time 8    Period Weeks    Status New    Target Date 02/15/22  PT LONG TERM GOAL #2   Title 5xSTS <13 sec hands free    Baseline eval: >24sec hands free    Time 8    Period Weeks    Status New    Target Date 02/15/22      PT LONG TERM GOAL #3   Title Pt to demonstrate retroAMB without device >0.1ms s LOB to demonstrate improved ankle righting and control.    Baseline Eval: <0.086m and c 3 LOB    Time 10    Period Weeks    Status New    Target Date 03/01/22      PT LONG TERM GOAL #4   Title FOTO Score increase >15    Baseline eval: 48    Time 12    Period Weeks    Status New    Target Date 03/15/22                   Plan - 12/28/21 1638     Clinical Impression Statement Pt tolerating session well today, no over fatigue issues, but continues to have insidious weakness and fatigability in bilat anterior compartment which leads to posterior loss of postural control repeatedly. Pt moving faster on nustep and STS, but ankles limit feelings of safety. Advised pt to stick to shoes with elevated heel for now to maximize safety.    Personal Factors and Comorbidities Past/Current Experience    Examination-Activity Limitations Dressing;Transfers;Bend;Locomotion Level;Stairs;Stand    Examination-Participation Restrictions Cleaning;Shop;Meal Prep;[facility]  Stability/Clinical Decision Making Evolving/Moderate complexity    Clinical Decision Making Moderate    Rehab Potential Good    Clinical Impairments Affecting Rehab Potential (+) motivation, social support (-) age, chronicity of pain, fairly sedentary lifestyle    PT Frequency 2x / week    PT Duration 12 weeks    PT Treatment/Interventions Aquatic Therapy;Moist Heat;Functional mobility training;Dry needling;Neuromuscular re-education;Traction;Ultrasound;Iontophoresis '4mg'$ /ml Dexamethasone;ADLs/Self Care Home Management;Cryotherapy;Electrical  Stimulation;Therapeutic exercise;Therapeutic activities;Patient/family education;Taping;Gait training;Stair training;DME Instruction;Balance training;Passive range of motion    PT Next Visit Plan don't let patient fall backwards    PT Home Exercise Plan Eval: seated heel raises, toe raises, and STS from elevated surface    Consulted and Agree with Plan of Care Patient;Family member/caregiver    Family Member Consulted Earnestine             Patient will benefit from skilled therapeutic intervention in order to improve the following deficits and impairments:  Decreased range of motion, Difficulty walking, Abnormal gait, Decreased balance, Decreased mobility, Decreased activity tolerance, Decreased knowledge of use of DME, Decreased strength, Dizziness  Visit Diagnosis: Muscle weakness (generalized)  Other lack of coordination  Aphasia  Unsteadiness on feet  Repeated falls  Dysarthria and anarthria     Problem List Patient Active Problem List   Diagnosis Date Noted   Acute ischemic left MCA stroke ([place]04/06/2022   Middle cerebral artery embolism, 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   Status post reverse arthroplasty of shoulder, right 06/10/2021   5:31 PM, 12/28/21 Al[place]ta GrandchildPT, DPT Physical Therapist - [phone]   BuMonticelloPT 12/28/2021, 5:07 PM  [facility]ynn[facility] [address]dSaltilloNCAl[place]hone: [phone] Fax:  [phone]: LeJULE WHITSELRN: [id number] of Birth: 4/10-02-1941

## 2021-12-29 ENCOUNTER — Encounter: Payer: Self-pay | Admitting: Occupational Therapy

## 2021-12-29 ENCOUNTER — Other Ambulatory Visit (HOSPITAL_COMMUNITY): Payer: Self-pay | Admitting: Interventional Radiology

## 2021-12-29 ENCOUNTER — Ambulatory Visit: Payer: Medicare Other | Admitting: Occupational Therapy

## 2021-12-29 ENCOUNTER — Ambulatory Visit: Payer: Medicare Other | Admitting: Speech Pathology

## 2021-12-29 DIAGNOSIS — R4701 Aphasia: Secondary | ICD-10-CM | POA: Diagnosis not present

## 2021-12-29 DIAGNOSIS — R471 Dysarthria and anarthria: Secondary | ICD-10-CM

## 2021-12-29 DIAGNOSIS — I771 Stricture of artery: Secondary | ICD-10-CM

## 2021-12-29 DIAGNOSIS — R2681 Unsteadiness on feet: Secondary | ICD-10-CM

## 2021-12-29 DIAGNOSIS — M6281 Muscle weakness (generalized): Secondary | ICD-10-CM

## 2021-12-29 DIAGNOSIS — R278 Other lack of coordination: Secondary | ICD-10-CM

## 2021-12-30 ENCOUNTER — Ambulatory Visit: Payer: Medicare Other

## 2021-12-30 NOTE — Therapy (Signed)
[place] MAIN [facility] SERVICES [address] [place], [place], [address] Phone: [phone]   Fax:  [phone]  Speech Language Pathology Treatment  Patient Details  Name: Cassidy Hernandez MRN: [id number] Date of Birth: 06-09-1941 Referring Provider (SLP): Merrilee Seashore, NP   Encounter Date: 12/29/2021   End of Session - 12/30/21 0949     Visit Number 4    Number of Visits 25    Date for SLP Re-Evaluation 03/16/22    Authorization Type BCBS MCR; $10 copay    Authorization - Visit Number 4    Progress Note Due on Visit 10    SLP Start Time 1400    SLP Stop Time  1500    SLP Time Calculation (min) 60 min    Activity Tolerance Patient tolerated treatment well             Past Medical History:  Diagnosis Date   Anginal pain ([place])    Aortic atherosclerosis    Cancer ([place])    Carotid artery stenosis 02/01/2010   a.) Doppler 77/93/9030: 09% LICA and 23% RICA. b.) Doppler 30/02/6225: >33% LICA and 35% RICA. c.) Doppler 10/25/16: >70% Bilater ICAs   Chronic bilateral low back pain with left-sided sciatica    CKD (chronic kidney disease), stage III ([place])    Coronary artery disease    a.) LHC 12/24/2014 -> small LAD system; diffuse CTO of LAD. 60% RCA. LM and LCx with no sig disease. no intervention; med mgmt.   Degenerative arthritis    Diastolic dysfunction    a.) TTE 09/20/2018: EF 55%, G1DD, mild LAE and RVE, triv-mild panvalvular regurgitation.   Dysarthria    GERD (gastroesophageal reflux disease)    Hepatic steatosis    History of kidney stones    HLD (hyperlipidemia)    Hypertension    Kidney stone    Lumbar radiculopathy    Major depression in remission ([place])    PVD (peripheral vascular disease) ([place])    Renal cyst, right 06/02/2015   a.) CT 06/02/2015 --> 4.4 cm RIGHT renal mass; MRI recommended. b.) MRI 06/16/2015 --> 3.8 x 4.2 x 4.0 cm proteinaceous/hemmorhagic cyst.   Subclavian steal syndrome    a.) LHC 12/24/2014 --> tight  eccentric 90% ostial stenosis with damping.   T2DM (type 2 diabetes mellitus) [facility])     Past Surgical History:  Procedure Laterality Date   ABDOMINAL HYSTERECTOMY     AUGMENTATION MAMMAPLASTY Bilateral 1980   BACK SURGERY  09/2018   BICEPT TENODESIS  06/10/2021   Procedure: BICEPS TENODESIS;  Surgeon: Corky Mull, MD;  Location: [place] [facility];  Service: Orthopedics;;   CARDIAC CATHETERIZATION Left 12/24/2014   Procedure: LEFT HEARD CATHETERIZATION; Location: [facility]; Surgeon: Laurence Aly, MD   CHOLECYSTECTOMY     COLONOSCOPY N/A 02/11/2016   Procedure: COLONOSCOPY;  Surgeon: Manya Silvas, MD;  [facility] ENDOSCOPY;  Service: Endoscopy;  Laterality: N/A;   IR CT HEAD LTD  11/16/2021   IR CT HEAD LTD  11/16/2021   IR INTRAVSC STENT CERV CAROTID W/O EMB-PROT MOD SED INC ANGIO  11/18/2021   IR PERCUTANEOUS ART THROMBECTOMY/INFUSION INTRACRANIAL INC DIAG ANGIO  11/16/2021   RADIOLOGY WITH ANESTHESIA N/A 11/16/2021   Procedure: IR WITH ANESTHESIA- CODE STROKE;  Surgeon: Radiologist, Medication, MD;  Location: [place];  Service: Radiology;  Laterality: N/A;   REVERSE SHOULDER ARTHROPLASTY Right 06/10/2021   Procedure: REVERSE SHOULDER ARTHROPLASTY;  Surgeon: Corky Mull, MD;  Location: [facility];  Service:  Orthopedics;  Laterality: Right;    There were no vitals filed for this visit.   Subjective Assessment - 12/30/21 0947     Subjective Pt planning to go to the beach with family over the long weekend.    Patient is accompained by: Family member   daughter Cassidy Hernandez   Currently in Pain? No/denies                   ADULT SLP TREATMENT - 12/30/21 0947       General Information   Behavior/Cognition Alert;Cooperative;Pleasant mood    HPI Pt is a 81 y.o. 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 Patient is s/p thrombectomy with left ICA stent placement 11/16/21. She completed inpatient rehabilitation at [facility] 11/25/21-12/07/21 and was discharged home with family.  Patient known to this clinic from prior course of voice therapy in 2018. Pt with documented hx of voice and speech changes (dysarthria) per videostroboscopy 12/26/17; did not attend therapy.      Treatment Provided   Treatment provided Cognitive-Linquistic      Cognitive-Linquistic Treatment   Treatment focused on Aphasia;Patient/family/caregiver education    Skilled Treatment Facilitated simple conversation re: upcoming weekend plans for beach trip with family. Pt described her favorite card game with usual moderate question cues. Continued training in semantic feature analysis. Pt provided 4-5 descriptions (location, association, use, action, category, physical description) for common nouns with occasional mod cues and written supports. Encouraged use of writing and gestures to augment expression. Pt named common nouns 80% acc, 100% acc with mod cues for semantic feature analysis. Provided aphasia card with emergency contact information and communication strategies. Education to pt and daughter on recommendations to support pt in conversation and with wordfinding during upcoming beach trip.      Assessment / Recommendations / Plan   Plan Continue with current plan of care      Progression Toward Goals   Progression toward goals Progressing toward goals              SLP Education - 12/30/21 0948     Education Details supportive conversation, cuing for description    Person(s) Educated Patient;Child(ren)    Methods Explanation;Demonstration    Comprehension Verbalized understanding              SLP Short Term Goals - 12/16/21 1921       SLP SHORT TERM GOAL #1   Title Pt will communicate emergency information 100% accuracy using visual aid if necessary.    Time 6    Period Weeks    Status New    Target Date 01/27/22      SLP SHORT TERM GOAL #2   Title Pt will generate at least 4 descriptors of target word 80% of the time using semantic feature analysis to improve abilities  in wordfinding and resolving communication breakdowns.    Time 6    Period Weeks    Status New    Target Date 01/27/22      SLP SHORT TERM GOAL #3   Title Patient will request repetition/rephrasing to aid auditory comprehension of mod complex information (multi-step commands, conversation) in 80% of opportunities.    Time 6    Period Weeks    Status New    Target Date 01/27/22      SLP SHORT TERM GOAL #4   Title Pt will read and reply to simple texts/emails 80% accuracy with use of accessibility features as necessary.  Time 6    Period Weeks    Status New    Target Date 01/27/22              SLP Long Term Goals - 12/16/21 1926       SLP LONG TERM GOAL #1   Title Pt will engage in 5-8 minutes simple-mod complex conversation re: topic of interest with supported conversation, aphasia compensations.    Time 12    Period Weeks    Status New    Target Date 03/16/22      SLP LONG TERM GOAL #2   Title Pt will ID and attempt repair of communication breakdowns >80% of the time in session.    Time 12    Period Weeks    Status New    Target Date 03/16/22      SLP LONG TERM GOAL #3   Title Pt will demonstrate reading comprehension of mod complex 8-10 paragraphs by generating summary or answering questions >80% accuracy.    Time 12    Period Weeks    Status New              Plan - 12/30/21 0949     Clinical Impression Statement Pt continues with moderate aphasia with expressive>receptive deficits, including impairments in verbal expression, reading, writing, and to a lesser extent, auditory comprehension. Pt continues with wordfinding difficulty and hesitation, however verbal fluency and error correction improving in simple conversation. Pt noted to use gestures to supplement speech and for self-cuing today on occasion. Continued training in semantic feature analysis and demonstration of using semantic cuing to repair communication breakdowns. Patient expresses desire to  improve communication with family and friends and to resume hobbies and interests such as reading and watching TV. I recommend skilled ST to improve pt's communication skills to increase safety, independence and quality of life.    Speech Therapy Frequency 2x / week    Duration 2 weeks    Potential to Achieve Goals Good    Potential Considerations Family/community support    SLP Home Exercise Plan Talk[facility] Therapy sign-up and tasks provided    Consulted and Agree with Plan of Care Patient;Family member/caregiver             Patient will benefit from skilled therapeutic intervention in order to improve the following deficits and impairments:   Aphasia  Dysarthria and anarthria    Problem List Patient Active Problem List   Diagnosis Date Noted   Acute ischemic left MCA stroke ([place]) 11/16/2021   Middle cerebral artery embolism, 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   Status post reverse arthroplasty of shoulder, right 06/10/2021   Deneise Lever, MS, CCC-SLP Speech-Language Pathologist [phone]  Aliene Altes, [place] 12/30/2021, 9:57 AM  [place] MAIN [facility] SERVICES [address] [place], [place], [address] Phone: [phone]   Fax:  [phone]   Name: SUEANN BROWNLEY MRN: [id number] Date of Birth: 27-Jul-1941

## 2021-12-30 NOTE — Therapy (Addendum)
[place] MAIN [facility] SERVICES [address] [place], [place], [address] Phone: [phone]   Fax:  [phone]  Occupational Therapy Treatment  Patient Details  Name: Cassidy Hernandez MRN: [id number] Date of Birth: 11-15-1940 Referring Provider (OT): Dr. Frazier Richards   Encounter Date: 12/29/2021   OT End of Session - 12/30/21 2216     Visit Number 4    Number of Visits 24    Date for OT Re-Evaluation 03/13/22    OT Start Time 1300    OT Stop Time 1345    OT Time Calculation (min) 45 min    Activity Tolerance Patient tolerated treatment well    Behavior During Therapy [facility] for tasks assessed/performed             Past Medical History:  Diagnosis Date   Anginal pain ([place])    Aortic atherosclerosis    Cancer ([place])    Carotid artery stenosis 02/01/2010   a.) Doppler 12/45/8099: 83% LICA and 38% RICA. b.) Doppler 25/12/3974: >73% LICA and 41% RICA. c.) Doppler 10/25/16: >70% Bilater ICAs   Chronic bilateral low back pain with left-sided sciatica    CKD (chronic kidney disease), stage III ([place])    Coronary artery disease    a.) LHC 12/24/2014 -> small LAD system; diffuse CTO of LAD. 60% RCA. LM and LCx with no sig disease. no intervention; med mgmt.   Degenerative arthritis    Diastolic dysfunction    a.) TTE 09/20/2018: EF 55%, G1DD, mild LAE and RVE, triv-mild panvalvular regurgitation.   Dysarthria    GERD (gastroesophageal reflux disease)    Hepatic steatosis    History of kidney stones    HLD (hyperlipidemia)    Hypertension    Kidney stone    Lumbar radiculopathy    Major depression in remission ([place])    PVD (peripheral vascular disease) ([place])    Renal cyst, right 06/02/2015   a.) CT 06/02/2015 --> 4.4 cm RIGHT renal mass; MRI recommended. b.) MRI 06/16/2015 --> 3.8 x 4.2 x 4.0 cm proteinaceous/hemmorhagic cyst.   Subclavian steal syndrome    a.) LHC 12/24/2014 --> tight eccentric 90% ostial stenosis with damping.    T2DM (type 2 diabetes mellitus) [facility])     Past Surgical History:  Procedure Laterality Date   ABDOMINAL HYSTERECTOMY     AUGMENTATION MAMMAPLASTY Bilateral 1980   BACK SURGERY  09/2018   BICEPT TENODESIS  06/10/2021   Procedure: BICEPS TENODESIS;  Surgeon: Corky Mull, MD;  Location: [place] [facility];  Service: Orthopedics;;   CARDIAC CATHETERIZATION Left 12/24/2014   Procedure: LEFT HEARD CATHETERIZATION; Location: [facility]; Surgeon: Laurence Aly, MD   CHOLECYSTECTOMY     COLONOSCOPY N/A 02/11/2016   Procedure: COLONOSCOPY;  Surgeon: Manya Silvas, MD;  Location: [facility] ENDOSCOPY;  Service: Endoscopy;  Laterality: N/A;   IR CT HEAD LTD  11/16/2021   IR CT HEAD LTD  11/16/2021   IR INTRAVSC STENT CERV CAROTID W/O EMB-PROT MOD SED INC ANGIO  11/18/2021   IR PERCUTANEOUS ART THROMBECTOMY/INFUSION INTRACRANIAL INC DIAG ANGIO  11/16/2021   RADIOLOGY WITH ANESTHESIA N/A 11/16/2021   Procedure: IR WITH ANESTHESIA- CODE STROKE;  Surgeon: Radiologist, Medication, MD;  Location: [place];  Service: Radiology;  Laterality: N/A;   REVERSE SHOULDER ARTHROPLASTY Right 06/10/2021   Procedure: REVERSE SHOULDER ARTHROPLASTY;  Surgeon: Corky Mull, MD;  Location: [facility];  Service: Orthopedics;  Laterality: Right;    There were no vitals filed for this visit.  Therapeutic  Exercises:  Pt seen for strengthening and balance tasks in standing to perform reaching tasks with use of shape tower, moving objects from one level to the next.  Incorporated reaching to obtain items from crate placed at knee height to reach into and retrieve shapes to place onto 4 level tower.  Lowered crate to floor and patient reaching in to retrieve items with min guard and one hand on table for stability.  Pt requiring one rest break during standing task.   Pt seen for Grip strengthening with use of  bilateral UE, 2nd setting for 11# for 20 reps each hand.  Cues for proper grasp on resistive hand gripper.   Finger strengthening with use of coins  to pick up and place into resistive bank  Rationale for Evaluation and Treatment Rehabilitation   Response to tx: Pt requires occasional rest breaks with activities.  Min guard for balance tasks in standing this date, able to reaching into crate on floor level to obtain items, compensatory strategy with placing one hand on table for balance while reaching for object with other hand.  Able to perform resistive grip on 2nd setting of hand gripper.  Difficulty with translatory skills of the hand and using the hand for storage at times.  Continue to work towards goals in plan of care to maximize safety and independence in necessary daily tasks.                     OT Education - 12/30/21 2215     Education Details Balance, [facility] skills, strengthening    Person(s) Educated Patient    Methods Explanation;Verbal cues;Demonstration    Comprehension Verbalized understanding;Verbal cues required;Need further instruction;Returned demonstration              OT Short Term Goals - 12/21/21 0946       OT SHORT TERM GOAL #1   Title Pt will be indep with theraputty HEP for increasing bilat grip/pinch strength.    Baseline Eval: not yet initiated    Time 6    Period Weeks    Status New    Target Date 01/30/22      OT SHORT TERM GOAL #2   Title Pt will be indep to verbalize 3 fall prevention strategies for safe ADL completion.    Baseline Eval: not yet initiated (hx of 4 falls in the last 6 months)    Time 6    Period Weeks    Status New    Target Date 01/30/22               OT Long Term Goals - 12/21/21 0947       OT LONG TERM GOAL #1   Title Pt will increase FOTO score to 45 or better.    Baseline Eval: FOTO 32 (OT estimates this # should be higher based on objective testing; likely inaccurate answer selections d/t aphasia)    Time 12    Period Weeks    Status New    Target Date 03/13/22      OT LONG TERM GOAL #2   Title Pt will increase bilat grip strength by  5 or more lbs for improved ability to hold and carry ADL supplies.    Baseline Eval: R 23, L 25    Time 12    Period Weeks    Status New    Target Date 03/13/22      OT LONG TERM GOAL #3   Title Pt  will perform tub transfer with modified indep.    Baseline Eval: min-mod A (FOF)    Time 12    Period Weeks    Status New    Target Date 03/13/22      OT LONG TERM GOAL #4   Title Pt will manage hot/cold meal prep with modified indep    Baseline Eval: family currently managing meals    Time 12    Period Weeks    Status New    Target Date 03/13/22      OT LONG TERM GOAL #5   Title Pt will manage medications with modified indep    Baseline Eval: Pt and daughter manage together since CVA    Time 64    Period Weeks    Status New    Target Date 03/13/22      Long Term Additional Goals   Additional Long Term Goals Yes      OT LONG TERM GOAL #6   Title Pt will safely reach in all directions and planes with unilateral to no UE support in order to perform ADL/IADLs with LRD.    Baseline Eval: Pt using rollator and has not yet attempted IADL tasks (family managing IADLs)    Time 12    Period Weeks    Status New    Target Date 03/13/22                   Plan - 12/30/21 2216     Clinical Impression Statement Pt requires occasional rest breaks with activities.  Min guard for balance tasks in standing this date, able to reaching into crate on floor level to obtain items, compensatory strategy with placing one hand on table for balance while reaching for object with other hand.  Able to perform resistive grip on 2nd setting of hand gripper.  Difficulty with translatory skills of the hand and using the hand for storage at times.  Continue to work towards goals in plan of care to maximize safety and independence in necessary daily tasks.    OT Occupational Profile and History Problem Focused Assessment - Including review of records relating to presenting problem    Occupational  performance deficits (Please refer to evaluation for details): ADL's;IADL's;Leisure;Social Participation    Body Structure / Function / Physical Skills ADL;Coordination;Endurance;GMC;UE functional use;Balance;Decreased knowledge of precautions;IADL;Strength;Dexterity;FMC;Gait;Mobility    Cognitive Skills Understand    Rehab Potential Good    Clinical Decision Making Several treatment options, min-mod task modification necessary    Comorbidities Affecting Occupational Performance: None    Modification or Assistance to Complete Evaluation  Min-Moderate modification of tasks or assist with assess necessary to complete eval   extra time to process commands and formulate answers to questions d/t aphasia   OT Frequency 2x / week    OT Duration 12 weeks    OT Treatment/Interventions Self-care/ADL training;Therapeutic exercise;DME and/or AE instruction;Functional Mobility Training;Balance training;Neuromuscular education;Therapeutic activities;Patient/family education;Coping strategies training    Plan OT focus on UB strengthening, dynamic standing balance, IADLs, med management    OT Home Exercise Plan not yet initiated    Consulted and Agree with Plan of Care Patient;Family member/caregiver             Patient will benefit from skilled therapeutic intervention in order to improve the following deficits and impairments:   Body Structure / Function / Physical Skills: ADL, Coordination, Endurance, GMC, UE functional use, Balance, Decreased knowledge of precautions, IADL, Strength, Dexterity, FMC, Gait, Mobility Cognitive Skills: Understand  Visit Diagnosis: Muscle weakness (generalized)  Other lack of coordination  Unsteadiness on feet    Problem List Patient Active Problem List   Diagnosis Date Noted   Acute ischemic left MCA stroke ([place]) 11/16/2021   Middle cerebral artery embolism, 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   Status post reverse arthroplasty of shoulder, right 06/10/2021   Meryn Sarracino T Tomasita Morrow,  OTR/L, CLT  Trindon Dorton, OT 12/30/2021, 10:27 PM  [place] MAIN [facility] SERVICES [address] [place], [place], [address] Phone: [phone]   Fax:  [phone]  Name: Cassidy Hernandez MRN: [id number] Date of Birth: 1941/01/30

## 2021-12-31 ENCOUNTER — Telehealth: Payer: Self-pay

## 2021-12-31 NOTE — Telephone Encounter (Signed)
Author reached out to patient's DTR regarding new findings in clinic that pt's footwear is impacting balance and safety. Author informed DTR how zerodrop footwear in clinic is causing more retro LOB, backward falls during session, but far fewer when wearing a shoe with elevated heel. Author recommending pt avoid zero drop footwear until rehab can address ROM and strength deficits in ankles. DTR acknowledges communication. This has previously been discussed in clinic with patient and aide.   8:22 AM, 12/31/21 Etta Grandchild, PT, DPT Physical Therapist - [facility]  [phone] [facility])

## 2022-01-04 ENCOUNTER — Ambulatory Visit: Payer: Medicare Other

## 2022-01-04 ENCOUNTER — Encounter: Payer: Medicare Other | Admitting: Speech Pathology

## 2022-01-04 DIAGNOSIS — R278 Other lack of coordination: Secondary | ICD-10-CM

## 2022-01-04 DIAGNOSIS — R262 Difficulty in walking, not elsewhere classified: Secondary | ICD-10-CM

## 2022-01-04 DIAGNOSIS — R2689 Other abnormalities of gait and mobility: Secondary | ICD-10-CM

## 2022-01-04 DIAGNOSIS — R4701 Aphasia: Secondary | ICD-10-CM | POA: Diagnosis not present

## 2022-01-04 DIAGNOSIS — R2681 Unsteadiness on feet: Secondary | ICD-10-CM

## 2022-01-04 DIAGNOSIS — R269 Unspecified abnormalities of gait and mobility: Secondary | ICD-10-CM

## 2022-01-04 DIAGNOSIS — R296 Repeated falls: Secondary | ICD-10-CM

## 2022-01-04 DIAGNOSIS — M6281 Muscle weakness (generalized): Secondary | ICD-10-CM

## 2022-01-04 NOTE — Therapy (Signed)
[place] MAIN [facility] SERVICES [address] [place], [place], [address] Phone: [phone]   Fax:  [phone]  Physical Therapy Treatment  Patient Details  Name: Cassidy Hernandez MRN: [id number] Date of Birth: [birth year] Referring Provider (PT): Merrilee Seashore, PA-C   Encounter Date: 01/04/2022   PT End of Session - 01/04/22 1655     Visit Number 4    Number of Visits 24    Date for PT Re-Evaluation 03/15/22    Authorization Type BCBS Medicare    Authorization Time Period 12/21/21-03/15/22    Progress Note Due on Visit 10    PT Start Time 1515    PT Stop Time 1600    PT Time Calculation (min) 45 min    Equipment Utilized During Treatment Gait belt    Activity Tolerance Patient tolerated treatment well;No increased pain    Behavior During Therapy [facility] for tasks assessed/performed             Past Medical History:  Diagnosis Date   Anginal pain ([place])    Aortic atherosclerosis    Cancer ([place])    Carotid artery stenosis 02/01/2010   a.) Doppler 66/44/0347: 42% LICA and 59% RICA. b.) Doppler 56/38/7564: >33% LICA and 29% RICA. c.) Doppler 10/25/16: >70% Bilater ICAs   Chronic bilateral low back pain with left-sided sciatica    CKD (chronic kidney disease), stage III ([facility])    Coronary artery disease    a.) LHC 12/24/2014 -> small LAD system; diffuse CTO of LAD. 60% RCA. LM and LCx with no sig disease. no intervention; med mgmt.   Degenerative arthritis    Diastolic dysfunction    a.) TTE 09/20/2018: EF 55%, G1DD, mild LAE and RVE, triv-mild panvalvular regurgitation.   Dysarthria    GERD (gastroesophageal reflux disease)    Hepatic steatosis    History of kidney stones    HLD (hyperlipidemia)    Hypertension    Kidney stone    Lumbar radiculopathy    Major depression in remission ([place])    PVD (peripheral vascular disease) ([place])    Renal cyst, right 06/02/2015   a.) CT 06/02/2015 --> 4.4 cm RIGHT renal mass; MRI recommended. b.) MRI  06/16/2015 --> 3.8 x 4.2 x 4.0 cm proteinaceous/hemmorhagic cyst.   Subclavian steal syndrome    a.) LHC 12/24/2014 --> tight eccentric 90% ostial stenosis with damping.   T2DM (type 2 diabetes mellitus) [facility])     Past Surgical History:  Procedure Laterality Date   ABDOMINAL HYSTERECTOMY     AUGMENTATION MAMMAPLASTY Bilateral 1980   BACK SURGERY  09/2018   BICEPT TENODESIS  06/10/2021   Procedure: BICEPS TENODESIS;  Surgeon: Corky Mull, MD;  Location: [place] [facility];  Service: Orthopedics;;   CARDIAC CATHETERIZATION Left 12/24/2014   Procedure: LEFT HEARD CATHETERIZATION; Location: [facility]; Surgeon: Laurence Aly, MD   CHOLECYSTECTOMY     COLONOSCOPY N/A 02/11/2016   Procedure: COLONOSCOPY;  Surgeon: Manya Silvas, MD;  Location: [facility] ENDOSCOPY;  Service: Endoscopy;  Laterality: N/A;   IR CT HEAD LTD  11/16/2021   IR CT HEAD LTD  11/16/2021   IR INTRAVSC STENT CERV CAROTID W/O EMB-PROT MOD SED INC ANGIO  11/18/2021   IR PERCUTANEOUS ART THROMBECTOMY/INFUSION INTRACRANIAL INC DIAG ANGIO  11/16/2021   RADIOLOGY WITH ANESTHESIA N/A 11/16/2021   Procedure: IR WITH ANESTHESIA- CODE STROKE;  Surgeon: Radiologist, Medication, MD;  Location: [place];  Service: Radiology;  Laterality: N/A;   REVERSE SHOULDER ARTHROPLASTY Right 06/10/2021  Procedure: REVERSE SHOULDER ARTHROPLASTY;  Surgeon: Corky Mull, MD;  Location: [facility];  Service: Orthopedics;  Laterality: Right;    There were no vitals filed for this visit.   Subjective Assessment - 01/04/22 1653     Subjective Patient reports she has been doing okay. States she did have a fall on Saturday in her bedroom. Reports sore right shouder but feeling okay today.    Pertinent History Pt Recent L MCA, s/p thrombectomy and ICA stenting. Pt with speech deficits. Pt has been somewhat unsteady with a few falls falling backward however DTR reports this is not new, but has been ongoing. Pt seen at our Sports clinic in 2018 fo rlow back pain issues in the setting  of spintal stenosis.    Currently in Pain? No/denies               INTERVENTIONS:  BP= 212/86 mmHg Right Arm using (dynamap) -Patient in no acute distress and she and her caregiver reported that she just completed some walking at mall just prior to visit.  Rechecked manually after 3 min- 182/80 mmHg Right arm Rechecked manually after 5 min rest=176/80 Rechecked manually after session= 158/78 mmHg Right UE.   Therapeutic Exercises:   Sit to stand (attempted without UE) - Patient unable and presented with posterior LOB back into chair with each attempt. Sit to stand (attempted with 1UE) -Patient able to perform 10 reps without difficulty- Instructed to only minimally use right UE as needed. Instructed in technique including scooting to edge, Leaning forward with cues (nose over toes)   Seated heel/toe raises x 15 reps each LE  Seated Long arc quad x 10 reps each LE Seated hip march x 10 reps each LE   Access Code: [email or web address] URL: [email or web address] Date: 01/04/2022 Prepared by: Sande Brothers  Exercises - Seated Heel Toe Raises  - 3 x weekly - 3 sets - 10 reps - 2 sec hold - Sit to Stand with Counter Support  - 3 x weekly - 3 sets - 10 reps - Seated Long Arc Quad  - 3 x weekly - 3 sets - 10 reps - 2 se c hold - Seated March  - 3 x weekly - 3 sets - 10 reps  Education provided throughout session via VC/TC and demonstration to facilitate movement at target joints and correct muscle activation for all testing and exercises performed.                   PT Education - 01/04/22 1654     Education Details Safety with mobility- to only perform HEP with caregiver present; Importance of checking BP    Person(s) Educated Patient    Methods Explanation;Demonstration;Tactile cues;Verbal cues    Comprehension Verbalized understanding;Returned demonstration;Verbal cues required;Tactile cues required;Need further instruction              PT Short  Term Goals - 12/21/21 1508       PT SHORT TERM GOAL #1   Title Independence in HEP for improved balance and strength    Baseline Issued at eval    Time 3    Period Weeks    Status New    Target Date 01/11/22      PT SHORT TERM GOAL #2   Title 5xSTS hands free <19sec    Baseline Eval: >24sec hands free    Time 4    Period Weeks    Status New    Target Date 01/18/22  PT SHORT TERM GOAL #3   Title Ankle DF MMT 5/5 bilat.    Baseline Eval: 4+/5 Right, 4/5 Left    Time 4    Period Weeks    Status New    Target Date 01/18/22               PT Long Term Goals - 12/21/21 1509       PT LONG TERM GOAL #1   Title 6MWT > 1248f c LRAD    Baseline July 2019: 1325f   Time 8    Period Weeks    Status New    Target Date 02/15/22      PT LONG TERM GOAL #2   Title 5xSTS <13 sec hands free    Baseline eval: >24sec hands free    Time 8    Period Weeks    Status New    Target Date 02/15/22      PT LONG TERM GOAL #3   Title Pt to demonstrate retroAMB without device >0.2548ms LOB to demonstrate improved ankle righting and control.    Baseline Eval: <0.52m30mnd c 3 LOB    Time 10    Period Weeks    Status New    Target Date 03/01/22      PT LONG TERM GOAL #4   Title FOTO Score increase >15    Baseline eval: 48    Time 12    Period Weeks    Status New    Target Date 03/15/22                   Plan - 01/04/22 1650     Clinical Impression Statement Patient presented with elevated blood pressure (checked with automatic cuff yet lower with manual cuff) - asymptomatic and lowered as session transpired. She exhibited difficulty with sit to stand transfers focusing on LE strength and required some UE support. She was issued a home program today as she reported only performing seated heel/toe exercises and walking - sometimes at mall like today. She responded well to all prescribed exercises today- able to return demonstration without difficulty. Her BP was the  lowest recorded at end of session. Encouraged patient to check at home and keep a log. Will continue to monitor and notify MD next visit if still elevated. PT will beneift from continued skilled PT interventions to address impairments in order to maximize independence and safety in ADL/IADL and to reduce risk of falls    Personal Factors and Comorbidities Past/Current Experience    Examination-Activity Limitations Dressing;Transfers;Bend;Locomotion Level;Stairs;Stand    Examination-Participation Restrictions Cleaning;Shop;Meal Prep;[employer] [place];Laundry    Stability/Clinical Decision Making Evolving/Moderate complexity    Rehab Potential Good    Clinical Impairments Affecting Rehab Potential (+) motivation, social support (-) age, chronicity of pain, fairly sedentary lifestyle    PT Frequency 2x / week    PT Duration 12 weeks    PT Treatment/Interventions Aquatic Therapy;Moist Heat;Functional mobility training;Dry needling;Neuromuscular re-education;Traction;Ultrasound;Iontophoresis '4mg'$ /ml Dexamethasone;ADLs/Self Care Home Management;Cryotherapy;Electrical Stimulation;Therapeutic exercise;Therapeutic activities;Patient/family education;Taping;Gait training;Stair training;DME Instruction;Balance training;Passive range of motion    PT Next Visit Plan Progress safety with mobility and LE strengthening as appropriate.    PT Home Exercise Plan Eval: seated heel raises, toe raises, and STS from elevated surface. 01/04/2022=Access Code: [email or web address]: [email or web address]    Consulted and Agree with Plan of Care Patient;Family member/caregiver    Family Member Consulted Earn[place]             Patient will  benefit from skilled therapeutic intervention in order to improve the following deficits and impairments:  Decreased range of motion, Difficulty walking, Abnormal gait, Decreased balance, Decreased mobility, Decreased activity tolerance, Decreased knowledge of use of DME, Decreased strength,  Dizziness  Visit Diagnosis: Muscle weakness (generalized)  Other lack of coordination  Unsteadiness on feet  Difficulty in walking, not elsewhere classified  Abnormality of gait and mobility  Other abnormalities of gait and mobility  Repeated falls     Problem List Patient Active Problem List   Diagnosis Date Noted   Acute ischemic left MCA stroke ([place]) 11/16/2021   Middle cerebral artery embolism, 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   Status post reverse arthroplasty of shoulder, right 06/10/2021    Lewis Moccasin, PT 01/04/2022, 4:55 PM  [place] MAIN [facility] SERVICES [address] [place], [place], [address] Phone: [phone]   Fax:  [phone]  Name: Cassidy Hernandez MRN: [id number] Date of Birth: 04-22-1941

## 2022-01-05 ENCOUNTER — Ambulatory Visit: Payer: Medicare Other | Admitting: Speech Pathology

## 2022-01-05 ENCOUNTER — Encounter: Payer: Self-pay | Admitting: Occupational Therapy

## 2022-01-05 ENCOUNTER — Ambulatory Visit: Payer: Medicare Other | Admitting: Physical Therapy

## 2022-01-05 ENCOUNTER — Ambulatory Visit: Payer: Medicare Other | Admitting: Occupational Therapy

## 2022-01-05 DIAGNOSIS — M6281 Muscle weakness (generalized): Secondary | ICD-10-CM

## 2022-01-05 DIAGNOSIS — R471 Dysarthria and anarthria: Secondary | ICD-10-CM

## 2022-01-05 DIAGNOSIS — R4701 Aphasia: Secondary | ICD-10-CM

## 2022-01-05 DIAGNOSIS — R2681 Unsteadiness on feet: Secondary | ICD-10-CM

## 2022-01-05 DIAGNOSIS — R278 Other lack of coordination: Secondary | ICD-10-CM

## 2022-01-05 NOTE — Therapy (Addendum)
[place] MAIN [place] [facility] SERVICES [address] [place], [place], [address] Phone: [phone]   Fax:  [phone]  Occupational Therapy Treatment  Patient Details  Name: ZASHA BELLEAU MRN: [id number] Date of Birth: [birth year] Referring Provider (OT): Dr. Frazier Richards   Encounter Date: 01/05/2022   OT End of Session - 01/05/22 1112     Visit Number 5    Number of Visits 24    Date for OT Re-Evaluation 03/13/22    OT Start Time 1105    OT Stop Time 1149    OT Time Calculation (min) 44 min    Activity Tolerance Patient tolerated treatment well    Behavior During Therapy [facility] for tasks assessed/performed             Past Medical History:  Diagnosis Date   Anginal pain ([place])    Aortic atherosclerosis    Cancer ([place])    Carotid artery stenosis 02/01/2010   a.) Doppler 17/49/4496: 75% LICA and 91% RICA. b.) Doppler 63/84/6659: >93% LICA and 57% RICA. c.) Doppler 10/25/16: >70% Bilater ICAs   Chronic bilateral low back pain with left-sided sciatica    CKD (chronic kidney disease), stage III ([place])    Coronary artery disease    a.) LHC 12/24/2014 -> small LAD system; diffuse CTO of LAD. 60% RCA. LM and LCx with no sig disease. no intervention; med mgmt.   Degenerative arthritis    Diastolic dysfunction    a.) TTE 09/20/2018: EF 55%, G1DD, mild LAE and RVE, triv-mild panvalvular regurgitation.   Dysarthria    GERD (gastroesophageal reflux disease)    Hepatic steatosis    History of kidney stones    HLD (hyperlipidemia)    Hypertension    Kidney stone    Lumbar radiculopathy    Major depression in remission ([place])    PVD (peripheral vascular disease) ([place])    Renal cyst, right 06/02/2015   a.) CT 06/02/2015 --> 4.4 cm RIGHT renal mass; MRI recommended. b.) MRI 06/16/2015 --> 3.8 x 4.2 x 4.0 cm proteinaceous/hemmorhagic cyst.   Subclavian steal syndrome    a.) LHC 12/24/2014 --> tight eccentric 90% ostial stenosis with damping.    T2DM (type 2 diabetes mellitus) [facility])     Past Surgical History:  Procedure Laterality Date   ABDOMINAL HYSTERECTOMY     AUGMENTATION MAMMAPLASTY Bilateral 1980   BACK SURGERY  09/2018   BICEPT TENODESIS  06/10/2021   Procedure: BICEPS TENODESIS;  Surgeon: Corky Mull, MD;  Location: [place] [facility];  Service: Orthopedics;;   CARDIAC CATHETERIZATION Left 12/24/2014   Procedure: LEFT HEARD CATHETERIZATION; Location: [facility]; Surgeon: Laurence Aly, MD   CHOLECYSTECTOMY     COLONOSCOPY N/A 02/11/2016   Procedure: COLONOSCOPY;  Surgeon: Manya Silvas, MD;  Location: [facility] ENDOSCOPY;  Service: Endoscopy;  Laterality: N/A;   IR CT HEAD LTD  11/16/2021   IR CT HEAD LTD  11/16/2021   IR INTRAVSC STENT CERV CAROTID W/O EMB-PROT MOD SED INC ANGIO  11/18/2021   IR PERCUTANEOUS ART THROMBECTOMY/INFUSION INTRACRANIAL INC DIAG ANGIO  11/16/2021   RADIOLOGY WITH ANESTHESIA N/A 11/16/2021   Procedure: IR WITH ANESTHESIA- CODE STROKE;  Surgeon: Radiologist, Medication, MD;  Location: [place];  Service: Radiology;  Laterality: N/A;   REVERSE SHOULDER ARTHROPLASTY Right 06/10/2021   Procedure: REVERSE SHOULDER ARTHROPLASTY;  Surgeon: Corky Mull, MD;  Location: [facility];  Service: Orthopedics;  Laterality: Right;    There were no vitals filed for this visit.  Subjective Assessment - 01/05/22 1107     Subjective  Sister with patient this date, denies any pain    Patient Stated Goals Return to being indep and walk without AD    Currently in Pain? No/denies    Pain Score 0-No pain           Rationale for Evaluation and Treatment Rehabilitation   Therapeutic Exercises:  Hand strengthening with resistive red putty for gross gripping, variety of tool use with unilateral and bilateral hand use.   Knob turn with standard grip, attempts at intrinsic turn.  Cap turn with standard turn, L bar for forearm supination, pronation, ulnar and radial deviation, functional pull  Key turn with standard turn, lateral pinch  turn Multiple reps performed of each exercise, cues at times for proper grasping patterns on tools.    ROM with SAEBO looped tower and balls from sitting and standing with min guard for balance  Neuro: Pt seen for focus on manipulation of cards, attempts with turning, flipping and using thumb finger combinations. Therapist demonstration and cues for prehension patterns.     Response to tx: Pt has continued to make good progress in all areas, activities performed in sitting and standing to help challenge her balance with activity.  Requires min guard to supervision for safety.  Pt responds well to cues, she does require occasional rest breaks.  Continue to work towards goals in plan of care to improve ROM, strength, balance and safety with daily tasks.                        OT Education - 01/05/22 1111     Education Details putty for hand strengthening, use of hand tools with putty    Person(s) Educated Patient    Methods Explanation;Verbal cues;Demonstration    Comprehension Verbalized understanding;Verbal cues required;Need further instruction;Returned demonstration              OT Short Term Goals - 12/21/21 0946       OT SHORT TERM GOAL #1   Title Pt will be indep with theraputty HEP for increasing bilat grip/pinch strength.    Baseline Eval: not yet initiated    Time 6    Period Weeks    Status New    Target Date 01/30/22      OT SHORT TERM GOAL #2   Title Pt will be indep to verbalize 3 fall prevention strategies for safe ADL completion.    Baseline Eval: not yet initiated (hx of 4 falls in the last 6 months)    Time 6    Period Weeks    Status New    Target Date 01/30/22               OT Long Term Goals - 12/21/21 0947       OT LONG TERM GOAL #1   Title Pt will increase FOTO score to 45 or better.    Baseline Eval: FOTO 32 (OT estimates this # should be higher based on objective testing; likely inaccurate answer selections d/t  aphasia)    Time 12    Period Weeks    Status New    Target Date 03/13/22      OT LONG TERM GOAL #2   Title Pt will increase bilat grip strength by 5 or more lbs for improved ability to hold and carry ADL supplies.    Baseline Eval: R 23, L 25    Time 12  Period Weeks    Status New    Target Date 03/13/22      OT LONG TERM GOAL #3   Title Pt will perform tub transfer with modified indep.    Baseline Eval: min-mod A (FOF)    Time 12    Period Weeks    Status New    Target Date 03/13/22      OT LONG TERM GOAL #4   Title Pt will manage hot/cold meal prep with modified indep    Baseline Eval: family currently managing meals    Time 12    Period Weeks    Status New    Target Date 03/13/22      OT LONG TERM GOAL #5   Title Pt will manage medications with modified indep    Baseline Eval: Pt and daughter manage together since CVA    Time 70    Period Weeks    Status New    Target Date 03/13/22      Long Term Additional Goals   Additional Long Term Goals Yes      OT LONG TERM GOAL #6   Title Pt will safely reach in all directions and planes with unilateral to no UE support in order to perform ADL/IADLs with LRD.    Baseline Eval: Pt using rollator and has not yet attempted IADL tasks (family managing IADLs)    Time 12    Period Weeks    Status New    Target Date 03/13/22                   Plan - 01/05/22 2022     Clinical Impression Statement Pt has continued to make good progress in all areas, activities performed in sitting and standing to help challenge her balance with activity.  Requires min guard to supervision for safety.  Pt responds well to cues, she does require occasional rest breaks.  Continue to work towards goals in plan of care to improve ROM, strength, balance and safety with daily tasks.    OT Occupational Profile and History Problem Focused Assessment - Including review of records relating to presenting problem    Occupational performance  deficits (Please refer to evaluation for details): ADL's;IADL's;Leisure;Social Participation    Body Structure / Function / Physical Skills ADL;Coordination;Endurance;GMC;UE functional use;Balance;Decreased knowledge of precautions;IADL;Strength;Dexterity;FMC;Gait;Mobility    Cognitive Skills Understand    Rehab Potential Good    Clinical Decision Making Several treatment options, min-mod task modification necessary    Comorbidities Affecting Occupational Performance: None    Modification or Assistance to Complete Evaluation  Min-Moderate modification of tasks or assist with assess necessary to complete eval   extra time to process commands and formulate answers to questions d/t aphasia   OT Frequency 2x / week    OT Duration 12 weeks    OT Treatment/Interventions Self-care/ADL training;Therapeutic exercise;DME and/or AE instruction;Functional Mobility Training;Balance training;Neuromuscular education;Therapeutic activities;Patient/family education;Coping strategies training    Plan OT focus on UB strengthening, dynamic standing balance, IADLs, med management    OT Home Exercise Plan not yet initiated    Consulted and Agree with Plan of Care Patient;Family member/caregiver             Patient will benefit from skilled therapeutic intervention in order to improve the following deficits and impairments:   Body Structure / Function / Physical Skills: ADL, Coordination, Endurance, GMC, UE functional use, Balance, Decreased knowledge of precautions, IADL, Strength, Dexterity, FMC, Gait, Mobility Cognitive Skills: Understand  Visit Diagnosis: Muscle weakness (generalized)  Other lack of coordination  Unsteadiness on feet    Problem List Patient Active Problem List   Diagnosis Date Noted   Acute ischemic left MCA stroke ([place]) 11/16/2021   Middle cerebral artery embolism, 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   Status post reverse arthroplasty of shoulder, right 06/10/2021   Regis Hinton T Tomasita Morrow, OTR/L,  CLT  Terrion Poblano, OT 01/05/2022, 8:24 PM  [place] MAIN [facility] SERVICES [place], [place], [address] Phone: [phone]   Fax:  [phone]  Name: TATE JERKINS MRN: [id number] Date of Birth: [birth year]

## 2022-01-05 NOTE — Therapy (Signed)
[place] MAIN [facility] SERVICES [address] [place], [place], [address] Phone: [phone]   Fax:  [phone]  Speech Language Pathology Treatment  Patient Details  Name: Cassidy Hernandez MRN: [id number] Date of Birth: [birth year] Referring Provider (SLP): Merrilee Seashore, NP   Encounter Date: 01/05/2022   End of Session - 01/05/22 1253     Visit Number 5    Number of Visits 25    Date for SLP Re-Evaluation 03/16/22    Authorization Type [employer] MCR; $10 copay    Authorization - Visit Number 5    Progress Note Due on Visit 10    SLP Start Time 1000    SLP Stop Time  1100    SLP Time Calculation (min) 60 min    Activity Tolerance Patient tolerated treatment well             Past Medical History:  Diagnosis Date   Anginal pain ([place])    Aortic atherosclerosis    Cancer ([place])    Carotid artery stenosis 02/01/2010   a.) Doppler 95/04/3266: 12% LICA and 45% RICA. b.) Doppler 80/99/8338: >25% LICA and 05% RICA. c.) Doppler 10/25/16: >70% Bilater ICAs   Chronic bilateral low back pain with left-sided sciatica    CKD (chronic kidney disease), stage III ([place])    Coronary artery disease    a.) LHC 12/24/2014 -> small LAD system; diffuse CTO of LAD. 60% RCA. LM and LCx with no sig disease. no intervention; med mgmt.   Degenerative arthritis    Diastolic dysfunction    a.) TTE 09/20/2018: EF 55%, G1DD, mild LAE and RVE, triv-mild panvalvular regurgitation.   Dysarthria    GERD (gastroesophageal reflux disease)    Hepatic steatosis    History of kidney stones    HLD (hyperlipidemia)    Hypertension    Kidney stone    Lumbar radiculopathy    Major depression in remission ([place])    PVD (peripheral vascular disease) ([place])    Renal cyst, right 06/02/2015   a.) CT 06/02/2015 --> 4.4 cm RIGHT renal mass; MRI recommended. b.) MRI 06/16/2015 --> 3.8 x 4.2 x 4.0 cm proteinaceous/hemmorhagic cyst.   Subclavian steal syndrome    a.) LHC 12/24/2014 --> tight  eccentric 90% ostial stenosis with damping.   T2DM (type 2 diabetes mellitus) [facility])     Past Surgical History:  Procedure Laterality Date   ABDOMINAL HYSTERECTOMY     AUGMENTATION MAMMAPLASTY Bilateral 1980   BACK SURGERY  09/2018   BICEPT TENODESIS  06/10/2021   Procedure: BICEPS TENODESIS;  Surgeon: Corky Mull, MD;  Location: [place] [facility];  Service: Orthopedics;;   CARDIAC CATHETERIZATION Left 12/24/2014   Procedure: LEFT HEARD CATHETERIZATION; Location: [facility]; Surgeon: Laurence Aly, MD   CHOLECYSTECTOMY     COLONOSCOPY N/A 02/11/2016   Procedure: COLONOSCOPY;  Surgeon: Manya Silvas, MD;  Location: [facility] ENDOSCOPY;  Service: Endoscopy;  Laterality: N/A;   IR CT HEAD LTD  11/16/2021   IR CT HEAD LTD  11/16/2021   IR INTRAVSC STENT CERV CAROTID W/O EMB-PROT MOD SED INC ANGIO  11/18/2021   IR PERCUTANEOUS ART THROMBECTOMY/INFUSION INTRACRANIAL INC DIAG ANGIO  11/16/2021   RADIOLOGY WITH ANESTHESIA N/A 11/16/2021   Procedure: IR WITH ANESTHESIA- CODE STROKE;  Surgeon: Radiologist, Medication, MD;  Location: [place];  Service: Radiology;  Laterality: N/A;   REVERSE SHOULDER ARTHROPLASTY Right 06/10/2021   Procedure: REVERSE SHOULDER ARTHROPLASTY;  Surgeon: Corky Mull, MD;  Location: [facility];  Service:  Orthopedics;  Laterality: Right;    There were no vitals filed for this visit.   Subjective Assessment - 01/05/22 1243     Subjective "We didn't go."    Patient is accompained by: Family member   sister   Currently in Pain? No/denies                   ADULT SLP TREATMENT - 01/05/22 1243       General Information   Behavior/Cognition Alert;Cooperative;Pleasant mood    HPI Pt is a 81 y.o. 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 Patient is s/p thrombectomy with left ICA stent placement 11/16/21. She completed inpatient rehabilitation at [facility] 11/25/21-12/07/21 and was discharged home with family. Patient known to this clinic from prior course of voice  therapy in 2018. Pt with documented hx of voice and speech changes (dysarthria) per videostroboscopy 12/26/17; did not attend therapy.      Treatment Provided   Treatment provided Cognitive-Linquistic      Pain Assessment   Pain Assessment No/denies pain      Cognitive-Linquistic Treatment   Treatment focused on Aphasia;Patient/family/caregiver education    Skilled Treatment Continued pt and caregiver training in supported conversation for aphasia with use of written keywords and semantic/question cueing to support pt's verbal expression in simple conversation re: her weekend with her daughter and adult grandchildren. With occasional min-mod cues, pt described weekend activities, meals and family members present. Pt attempted to abandon topic when discussing a documentary she watched; with moderate cues she was able to describe sufficiently to resolve breakdown. With written cues, pt role played emergency phone call, providing emergency information 100% accuracy. To provide visual aids for home next session. Initiated training in accessibility features to assist with text communications. Pt requested repetition when taking phone and email details from SLP when necessary 75% of the time (3/4 breakdowns). Pt wrote text to SLP. Spelling correct, however syntax errors made intent of pt's message unclear. Pt reported having difficulty comprehending some text messages. Demonstrated use of text-to-speech for message playback. Pt accuracy with typing her message improved with audio typing feedback vs speech-to-text (paraphasias/apraxia/dysarthria). Pt generated message re: her lunch plans with mod-max cues and 1 syntax error (omission of "nice").      Assessment / Recommendations / Plan   Plan Continue with current plan of care      Progression Toward Goals   Progression toward goals Progressing toward goals              SLP Education - 01/05/22 1252     Education Details accessibility features for  texting    Person(s) Educated Patient    Methods Explanation;Demonstration;Tactile cues;Verbal cues    Comprehension Verbalized understanding;Verbal cues required;Tactile cues required;Need further instruction              SLP Short Term Goals - 12/16/21 1921       SLP SHORT TERM GOAL #1   Title Pt will communicate emergency information 100% accuracy using visual aid if necessary.    Time 6    Period Weeks    Status New    Target Date 01/27/22      SLP SHORT TERM GOAL #2   Title Pt will generate at least 4 descriptors of target word 80% of the time using semantic feature analysis to improve abilities in wordfinding and resolving communication breakdowns.    Time 6    Period Weeks    Status New  Target Date 01/27/22      SLP SHORT TERM GOAL #3   Title Patient will request repetition/rephrasing to aid auditory comprehension of mod complex information (multi-step commands, conversation) in 80% of opportunities.    Time 6    Period Weeks    Status New    Target Date 01/27/22      SLP SHORT TERM GOAL #4   Title Pt will read and reply to simple texts/emails 80% accuracy with use of accessibility features as necessary.    Time 6    Period Weeks    Status New    Target Date 01/27/22              SLP Long Term Goals - 12/16/21 1926       SLP LONG TERM GOAL #1   Title Pt will engage in 5-8 minutes simple-mod complex conversation re: topic of interest with supported conversation, aphasia compensations.    Time 12    Period Weeks    Status New    Target Date 03/16/22      SLP LONG TERM GOAL #2   Title Pt will ID and attempt repair of communication breakdowns >80% of the time in session.    Time 12    Period Weeks    Status New    Target Date 03/16/22      SLP LONG TERM GOAL #3   Title Pt will demonstrate reading comprehension of mod complex 8-10 paragraphs by generating summary or answering questions >80% accuracy.    Time 12    Period Weeks    Status New               Plan - 01/05/22 1253     Clinical Impression Statement Pt continues with moderate aphasia with expressive>receptive deficits, including impairments in verbal expression, reading, writing, and to a lesser extent, auditory comprehension. Pt continues with wordfinding difficulty and hesitation, however verbal fluency and error correction improving in simple conversation. Pt receptive to using accessibility features to assist with reading and sending texts. Patient expresses desire to improve communication with family and friends and to resume hobbies and interests such as reading and watching TV. I recommend skilled ST to improve pt's communication skills to increase safety, independence and quality of life.    Speech Therapy Frequency 2x / week    Duration 2 weeks    Potential to Achieve Goals Good    Potential Considerations Family/community support    SLP Home Exercise Plan Talk[employer] Therapy sign-up and tasks provided    Consulted and Agree with Plan of Care Patient;Family member/caregiver             Patient will benefit from skilled therapeutic intervention in order to improve the following deficits and impairments:   Aphasia  Dysarthria and anarthria    Problem List Patient Active Problem List   Diagnosis Date Noted   Acute ischemic left MCA stroke ([place]) 11/16/2021   Middle cerebral artery embolism, 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   Status post reverse arthroplasty of shoulder, right 06/10/2021   Deneise Lever, MS, CCC-SLP Speech-Language Pathologist [phone]  Aliene Altes, [place] 01/05/2022, 12:54 PM  [place] MAIN [facility] SERVICES [address] [place], [place], [address] Phone: [phone]   Fax:  [phone]   Name: Cassidy Hernandez MRN: [id number] Date of Birth: Jan 28, 1941

## 2022-01-06 ENCOUNTER — Ambulatory Visit: Payer: Medicare Other

## 2022-01-06 ENCOUNTER — Ambulatory Visit: Payer: Medicare Other | Attending: Nurse Practitioner | Admitting: Speech Pathology

## 2022-01-06 DIAGNOSIS — M6281 Muscle weakness (generalized): Secondary | ICD-10-CM

## 2022-01-06 DIAGNOSIS — R4701 Aphasia: Secondary | ICD-10-CM | POA: Insufficient documentation

## 2022-01-06 DIAGNOSIS — R296 Repeated falls: Secondary | ICD-10-CM | POA: Diagnosis present

## 2022-01-06 DIAGNOSIS — R2689 Other abnormalities of gait and mobility: Secondary | ICD-10-CM | POA: Insufficient documentation

## 2022-01-06 DIAGNOSIS — R269 Unspecified abnormalities of gait and mobility: Secondary | ICD-10-CM

## 2022-01-06 DIAGNOSIS — R471 Dysarthria and anarthria: Secondary | ICD-10-CM | POA: Diagnosis present

## 2022-01-06 DIAGNOSIS — R262 Difficulty in walking, not elsewhere classified: Secondary | ICD-10-CM

## 2022-01-06 DIAGNOSIS — R2681 Unsteadiness on feet: Secondary | ICD-10-CM | POA: Insufficient documentation

## 2022-01-06 DIAGNOSIS — R278 Other lack of coordination: Secondary | ICD-10-CM | POA: Diagnosis present

## 2022-01-06 DIAGNOSIS — I63512 Cerebral infarction due to unspecified occlusion or stenosis of left middle cerebral artery: Secondary | ICD-10-CM | POA: Diagnosis present

## 2022-01-06 NOTE — Therapy (Signed)
[place] MAIN [facility] SERVICES [address] [place], [place], [address] Phone: [phone]   Fax:  [phone]  Speech Language Pathology Treatment  Patient Details  Name: Cassidy Hernandez MRN: [id number] Date of Birth: 07-13-1941 Referring Provider (SLP): Merrilee Seashore, NP   Encounter Date: 01/06/2022   End of Session - 01/06/22 1421     Visit Number 6    Number of Visits 25    Date for SLP Re-Evaluation 03/16/22    Authorization Type BCBS MCR; $10 copay    Authorization - Visit Number 6    Progress Note Due on Visit 10    SLP Start Time 1100    SLP Stop Time  1200    SLP Time Calculation (min) 60 min    Activity Tolerance Patient tolerated treatment well             Past Medical History:  Diagnosis Date   Anginal pain ([place])    Aortic atherosclerosis    Cancer ([place])    Carotid artery stenosis 02/01/2010   a.) Doppler 67/59/1638: 46% LICA and 65% RICA. b.) Doppler 99/35/7017: >79% LICA and 39% RICA. c.) Doppler 10/25/16: >70% Bilater ICAs   Chronic bilateral low back pain with left-sided sciatica    CKD (chronic kidney disease), stage III ([place])    Coronary artery disease    a.) LHC 12/24/2014 -> small LAD system; diffuse CTO of LAD. 60% RCA. LM and LCx with no sig disease. no intervention; med mgmt.   Degenerative arthritis    Diastolic dysfunction    a.) TTE 09/20/2018: EF 55%, G1DD, mild LAE and RVE, triv-mild panvalvular regurgitation.   Dysarthria    GERD (gastroesophageal reflux disease)    Hepatic steatosis    History of kidney stones    HLD (hyperlipidemia)    Hypertension    Kidney stone    Lumbar radiculopathy    Major depression in remission ([place])    PVD (peripheral vascular disease) ([place])    Renal cyst, right 06/02/2015   a.) CT 06/02/2015 --> 4.4 cm RIGHT renal mass; MRI recommended. b.) MRI 06/16/2015 --> 3.8 x 4.2 x 4.0 cm proteinaceous/hemmorhagic cyst.   Subclavian steal syndrome    a.) LHC 12/24/2014 --> tight  eccentric 90% ostial stenosis with damping.   T2DM (type 2 diabetes mellitus) [facility])     Past Surgical History:  Procedure Laterality Date   ABDOMINAL HYSTERECTOMY     AUGMENTATION MAMMAPLASTY Bilateral 1980   BACK SURGERY  09/2018   BICEPT TENODESIS  06/10/2021   Procedure: BICEPS TENODESIS;  Surgeon: Corky Mull, MD;  Location: [place] [facility];  Service: Orthopedics;;   CARDIAC CATHETERIZATION Left 12/24/2014   Procedure: LEFT HEARD CATHETERIZATION; Location: [facility]; Surgeon: Laurence Aly, MD   CHOLECYSTECTOMY     COLONOSCOPY N/A 02/11/2016   Procedure: COLONOSCOPY;  Surgeon: Manya Silvas, MD;  Location: [facility];  Service: Endoscopy;  Laterality: N/A;   IR CT HEAD LTD  11/16/2021   IR CT HEAD LTD  11/16/2021   IR INTRAVSC STENT CERV CAROTID W/O EMB-PROT MOD SED INC ANGIO  11/18/2021   IR PERCUTANEOUS ART THROMBECTOMY/INFUSION INTRACRANIAL INC DIAG ANGIO  11/16/2021   RADIOLOGY WITH ANESTHESIA N/A 11/16/2021   Procedure: IR WITH ANESTHESIA- CODE STROKE;  Surgeon: Radiologist, Medication, MD;  Location: [place];  Service: Radiology;  Laterality: N/A;   REVERSE SHOULDER ARTHROPLASTY Right 06/10/2021   Procedure: REVERSE SHOULDER ARTHROPLASTY;  Surgeon: Corky Mull, MD;  Location: [facility] ORS;  Service:  Orthopedics;  Laterality: Right;    There were no vitals filed for this visit.   Subjective Assessment - 01/06/22 1410     Subjective Patient arrives with aide    Patient is accompained by: --   Ernestine   Currently in Pain? No/denies                   ADULT SLP TREATMENT - 01/06/22 1411       General Information   Behavior/Cognition Alert;Cooperative;Pleasant mood    HPI Pt is a 81 y.o. 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 Patient is s/p thrombectomy with left ICA stent placement 11/16/21. She completed inpatient rehabilitation at [facility] 11/25/21-12/07/21 and was discharged home with family. Patient known to this clinic from prior course of voice  therapy in 2018. Pt with documented hx of voice and speech changes (dysarthria) per videostroboscopy 12/26/17; did not attend therapy.      Treatment Provided   Treatment provided Cognitive-Linquistic      Pain Assessment   Pain Assessment No/denies pain      Cognitive-Linquistic Treatment   Treatment focused on Aphasia;Patient/family/caregiver education    Skilled Treatment Provided visual aids for emergency calls for pt to place by her phones. SLP and graduate clinician facilitated simple conversation re: pt interest of true crime novels. Frequent min-mod cuing  (written keywords, gestures, semantic cuing) necessary as well as encouragement to repair breakdowns vs abandon topics. Targeted reading comprehension and verbal expression for short paragraphs using [employer] app. Pt read articles aloud 85% accuracy with occasional min cues, and answered simple summary questions verbally 80% accuracy. Pt read comprehension questions and answered 83% acc.      Assessment / Recommendations / Plan   Plan Continue with current plan of care      Progression Toward Goals   Progression toward goals Progressing toward goals              SLP Education - 01/06/22 1421     Education Details conversational supports/cuing for aphasia    Person(s) Educated Patient;Caregiver(s)    Methods Explanation    Comprehension Verbalized understanding;Need further instruction              SLP Short Term Goals - 12/16/21 1921       SLP SHORT TERM GOAL #1   Title Pt will communicate emergency information 100% accuracy using visual aid if necessary.    Time 6    Period Weeks    Status New    Target Date 01/27/22      SLP SHORT TERM GOAL #2   Title Pt will generate at least 4 descriptors of target word 80% of the time using semantic feature analysis to improve abilities in wordfinding and resolving communication breakdowns.    Time 6    Period Weeks    Status New    Target Date 01/27/22       SLP SHORT TERM GOAL #3   Title Patient will request repetition/rephrasing to aid auditory comprehension of mod complex information (multi-step commands, conversation) in 80% of opportunities.    Time 6    Period Weeks    Status New    Target Date 01/27/22      SLP SHORT TERM GOAL #4   Title Pt will read and reply to simple texts/emails 80% accuracy with use of accessibility features as necessary.    Time 6    Period Weeks    Status New  Target Date 01/27/22              SLP Long Term Goals - 12/16/21 1926       SLP LONG TERM GOAL #1   Title Pt will engage in 5-8 minutes simple-mod complex conversation re: topic of interest with supported conversation, aphasia compensations.    Time 12    Period Weeks    Status New    Target Date 03/16/22      SLP LONG TERM GOAL #2   Title Pt will ID and attempt repair of communication breakdowns >80% of the time in session.    Time 12    Period Weeks    Status New    Target Date 03/16/22      SLP LONG TERM GOAL #3   Title Pt will demonstrate reading comprehension of mod complex 8-10 paragraphs by generating summary or answering questions >80% accuracy.    Time 12    Period Weeks    Status New              Plan - 01/06/22 1422     Clinical Impression Statement Pt continues with moderate aphasia with expressive>receptive deficits, including impairments in verbal expression, reading, writing, and to a lesser extent, auditory comprehension. Pt continues with wordfinding difficulty and hesitation, however verbal fluency and error correction improving in simple conversation. Pt able to comprehend simple news paragraphs ~80% with min cues and multimodal supports. Patient expresses desire to improve communication with family and friends and to resume hobbies and interests such as reading and watching TV. I recommend skilled ST to improve pt's communication skills to increase safety, independence and quality of life.    Speech Therapy  Frequency 2x / week    Duration 2 weeks    Potential to Achieve Goals Good    Potential Considerations Family/community support    SLP Home Exercise Plan TalkPath Therapy sign-up and tasks provided    Consulted and Agree with Plan of Care Patient;Family member/caregiver             Patient will benefit from skilled therapeutic intervention in order to improve the following deficits and impairments:   Aphasia  Dysarthria and anarthria    Problem List Patient Active Problem List   Diagnosis Date Noted   Acute ischemic left MCA stroke ([place]) 11/16/2021   Middle cerebral artery embolism, 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   Status post reverse arthroplasty of shoulder, right 06/10/2021   Deneise Lever, MS, CCC-SLP Speech-Language Pathologist [phone]  Aliene Altes, [place] 01/06/2022, 2:23 PM  [place] MAIN [facility] SERVICES [address] [place], [place], [address] Phone: [phone]   Fax:  [phone]   Name: EVALENA FUJII MRN: [id number] Date of Birth: September 29, 1940

## 2022-01-06 NOTE — Therapy (Signed)
[place] MAIN [facility] SERVICES [address] [place], [place], [address] Phone: [phone]   Fax:  [phone]  Physical Therapy Treatment  Patient Details  Name: Cassidy Hernandez MRN: [id number] Date of Birth: 1941/07/19 Referring Provider (PT): Merrilee Seashore, PA-C   Encounter Date: 01/06/2022   PT End of Session - 01/06/22 1023     Visit Number 5    Number of Visits 24    Date for PT Re-Evaluation 03/15/22    Authorization Type BCBS Medicare    Authorization Time Period 12/21/21-03/15/22    Progress Note Due on Visit 10    PT Start Time 1017    PT Stop Time 1058    PT Time Calculation (min) 41 min    Equipment Utilized During Treatment Gait belt    Activity Tolerance Patient tolerated treatment well;No increased pain    Behavior During Therapy [facility] for tasks assessed/performed             Past Medical History:  Diagnosis Date   Anginal pain ([place])    Aortic atherosclerosis    Cancer ([place])    Carotid artery stenosis 02/01/2010   a.) Doppler 45/36/4680: 32% LICA and 12% RICA. b.) Doppler 24/82/5003: >70% LICA and 48% RICA. c.) Doppler 10/25/16: >70% Bilater ICAs   Chronic bilateral low back pain with left-sided sciatica    CKD (chronic kidney disease), stage III ([facility])    Coronary artery disease    a.) LHC 12/24/2014 -> small LAD system; diffuse CTO of LAD. 60% RCA. LM and LCx with no sig disease. no intervention; med mgmt.   Degenerative arthritis    Diastolic dysfunction    a.) TTE 09/20/2018: EF 55%, G1DD, mild LAE and RVE, triv-mild panvalvular regurgitation.   Dysarthria    GERD (gastroesophageal reflux disease)    Hepatic steatosis    History of kidney stones    HLD (hyperlipidemia)    Hypertension    Kidney stone    Lumbar radiculopathy    Major depression in remission ([place])    PVD (peripheral vascular disease) ([place])    Renal cyst, right 06/02/2015   a.) CT 06/02/2015 --> 4.4 cm RIGHT renal mass; MRI recommended. b.) MRI  06/16/2015 --> 3.8 x 4.2 x 4.0 cm proteinaceous/hemmorhagic cyst.   Subclavian steal syndrome    a.) LHC 12/24/2014 --> tight eccentric 90% ostial stenosis with damping.   T2DM (type 2 diabetes mellitus) [facility])     Past Surgical History:  Procedure Laterality Date   ABDOMINAL HYSTERECTOMY     AUGMENTATION MAMMAPLASTY Bilateral 1980   BACK SURGERY  09/2018   BICEPT TENODESIS  06/10/2021   Procedure: BICEPS TENODESIS;  Surgeon: Corky Mull, MD;  Location: [place] [facility];  Service: Orthopedics;;   CARDIAC CATHETERIZATION Left 12/24/2014   Procedure: LEFT HEARD CATHETERIZATION; Location: [facility]; Surgeon: Laurence Aly, MD   CHOLECYSTECTOMY     COLONOSCOPY N/A 02/11/2016   Procedure: COLONOSCOPY;  Surgeon: Manya Silvas, MD;  Location: [facility] ENDOSCOPY;  Service: Endoscopy;  Laterality: N/A;   IR CT HEAD LTD  11/16/2021   IR CT HEAD LTD  11/16/2021   IR INTRAVSC STENT CERV CAROTID W/O EMB-PROT MOD SED INC ANGIO  11/18/2021   IR PERCUTANEOUS ART THROMBECTOMY/INFUSION INTRACRANIAL INC DIAG ANGIO  11/16/2021   RADIOLOGY WITH ANESTHESIA N/A 11/16/2021   Procedure: IR WITH ANESTHESIA- CODE STROKE;  Surgeon: Radiologist, Medication, MD;  Location: [place];  Service: Radiology;  Laterality: N/A;   REVERSE SHOULDER ARTHROPLASTY Right 06/10/2021  Procedure: REVERSE SHOULDER ARTHROPLASTY;  Surgeon: Corky Mull, MD;  Location: [facility] ORS;  Service: Orthopedics;  Laterality: Right;    There were no vitals filed for this visit.   Subjective Assessment - 01/06/22 1021     Subjective Patient reports doing okay. Reports a little soreness after doing the new home exercises.    Pertinent History Pt Recent L MCA, s/p thrombectomy and ICA stenting. Pt with speech deficits. Pt has been somewhat unsteady with a few falls falling backward however DTR reports this is not new, but has been ongoing. Pt seen at our [facility] clinic in 2018 fo rlow back pain issues in the setting of spintal stenosis.    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 No/denies            INTERVENTIONS:   BP = 157/72 mmHg Right UE sitting   Therapeutic Activities:  Assessed 2 different shoes- Patient reports she normally wears flats but having issues with falling backward and brought in a few pair of shoes that she wears.   Sketchers tennis shoes - Patient performed static stand with feet apart then narrowed, then progressive AP and PA weight shifts with some posterior LOB (once reached LOS) - No difficulty with retro gait. No issues with walking approx 100 feet (including turning/forward/retro gait)   Zero drop flat shoes- mild increased unsteadiness with posterior weight shifting and inability to self correct forward. No issues with walking forward or retro with use of 4WW today- approx 100 feet   Neuromuscular re-ed: (at support bar with CGA and use of gait belt)   -static stand with feet narrowed - dynamic A/P weight shifting with Instruction that once she leans back and toes pop up then try to shift forward and once she shifts forward and heel raises- then try to rock back. X 20-25 reps (increased difficulty with posterior lean initially - did improve with practice.  - Dynamic Lat weight shift- x25 reps each without difficulty.  - On rockerboard A/P- with light UE support initially- progressed to 1 UE support - unable to achieve no UE Support- more difficulty with posterior LOB. X 30 reps. - Static stand on 1/2 foam roll (curve side down) - attempting to static stand- Moderate unsteadiness requiring ankle and hip righting reactions x multiple trials.   Education provided throughout session via VC/TC and demonstration to facilitate movement at target  joints and correct muscle activation for all testing and exercises performed.                       PT Education - 01/06/22 1022     Education provided Yes    Education Details Exercise technique: Discussed appropriate shoes for best balance/walking.    Person(s) Educated Patient    Methods Explanation;Demonstration;Tactile cues;Verbal cues    Comprehension Verbalized understanding;Returned demonstration;Verbal cues required;Tactile cues required;Need further instruction              PT Short Term Goals - 12/21/21 1508       PT SHORT TERM GOAL #1   Title Independence in HEP for improved balance and strength    Baseline Issued at eval    Time 3    Period Weeks  Status New    Target Date 01/11/22      PT SHORT TERM GOAL #2   Title 5xSTS hands free <19sec    Baseline Eval: >24sec hands free    Time 4    Period Weeks    Status New    Target Date 01/18/22      PT SHORT TERM GOAL #3   Title Ankle DF MMT 5/5 bilat.    Baseline Eval: 4+/5 Right, 4/5 Left    Time 4    Period Weeks    Status New    Target Date 01/18/22               PT Long Term Goals - 12/21/21 1509       PT LONG TERM GOAL #1   Title 6MWT > 124f c LRAD    Baseline July 2019: 13273f   Time 8    Period Weeks    Status New    Target Date 02/15/22      PT LONG TERM GOAL #2   Title 5xSTS <13 sec hands free    Baseline eval: >24sec hands free    Time 8    Period Weeks    Status New    Target Date 02/15/22      PT LONG TERM GOAL #3   Title Pt to demonstrate retroAMB without device >0.2565ms LOB to demonstrate improved ankle righting and control.    Baseline Eval: <0.66m60mnd c 3 LOB    Time 10    Period Weeks    Status New    Target Date 03/01/22      PT LONG TERM GOAL #4   Title FOTO Score increase >15    Baseline eval: 48    Time 12    Period Weeks    Status New    Target Date 03/15/22                   Plan - 01/06/22 1023     Clinical  Impression Statement Patient arrived today with several pairs of shoes and wanted to see if they made a difference with balance. Upon further evaluation- the zero drop or flats were more unstable with more posterior LOB vs. A more neutral small mm drop sketchers tennis shoe. She responded well to balance training focusing on righting reactions yet continues to lose her balance mostly posteriorly- did improve overall today with practice. PT will beneift from continued skilled PT interventions to address impairments in order to maximize independence and safety in ADL/IADL and to reduce risk of falls    Personal Factors and Comorbidities Past/Current Experience    Examination-Activity Limitations Dressing;Transfers;Bend;Locomotion Level;Stairs;Stand    Examination-Participation Restrictions Cleaning;Shop;Meal Prep;[place];Laundry    Stability/Clinical Decision Making Evolving/Moderate complexity    Rehab Potential Good    Clinical Impairments Affecting Rehab Potential (+) motivation, social support (-) age, chronicity of pain, fairly sedentary lifestyle    PT Frequency 2x / week    PT Duration 12 weeks    PT Treatment/Interventions Aquatic Therapy;Moist Heat;Functional mobility training;Dry needling;Neuromuscular re-education;Traction;Ultrasound;Iontophoresis '4mg'$ /ml Dexamethasone;ADLs/Self Care Home Management;Cryotherapy;Electrical Stimulation;Therapeutic exercise;Therapeutic activities;Patient/family education;Taping;Gait training;Stair training;DME Instruction;Balance training;Passive range of motion    PT Next Visit Plan Progress safety with mobility and LE strengthening as appropriate.    PT Home Exercise Plan Eval: seated heel raises, toe raises, and STS from elevated surface. 01/04/2022=Access Code: [email or web address]: [email or web address]    Consulted and Agree with Plan of Care Patient;Family member/caregiver  Family Member Consulted Earnestine             Patient will benefit  from skilled therapeutic intervention in order to improve the following deficits and impairments:  Decreased range of motion, Difficulty walking, Abnormal gait, Decreased balance, Decreased mobility, Decreased activity tolerance, Decreased knowledge of use of DME, Decreased strength, Dizziness  Visit Diagnosis: Muscle weakness (generalized)  Other lack of coordination  Unsteadiness on feet  Abnormality of gait and mobility  Difficulty in walking, not elsewhere classified  Other abnormalities of gait and mobility     Problem List Patient Active Problem List   Diagnosis Date Noted   Acute ischemic left MCA stroke ([place]) 11/16/2021   Middle cerebral artery embolism, 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   Status post reverse arthroplasty of shoulder, right 06/10/2021    Lewis Moccasin, PT 01/06/2022, 1:19 PM  [place] MAIN [facility] SERVICES [address] [place], [place], [address] Phone: [phone]   Fax:  [phone]  Name: DILLIE BURANDT MRN: [id number] Date of Birth: 1941-08-06

## 2022-01-10 ENCOUNTER — Encounter: Payer: Self-pay | Admitting: Occupational Therapy

## 2022-01-10 ENCOUNTER — Encounter: Payer: Medicare Other | Admitting: Speech Pathology

## 2022-01-10 ENCOUNTER — Ambulatory Visit: Payer: Medicare Other | Admitting: Occupational Therapy

## 2022-01-10 ENCOUNTER — Ambulatory Visit: Payer: Medicare Other

## 2022-01-10 DIAGNOSIS — R269 Unspecified abnormalities of gait and mobility: Secondary | ICD-10-CM

## 2022-01-10 DIAGNOSIS — M6281 Muscle weakness (generalized): Secondary | ICD-10-CM

## 2022-01-10 DIAGNOSIS — R278 Other lack of coordination: Secondary | ICD-10-CM

## 2022-01-10 DIAGNOSIS — R262 Difficulty in walking, not elsewhere classified: Secondary | ICD-10-CM

## 2022-01-10 DIAGNOSIS — R4701 Aphasia: Secondary | ICD-10-CM | POA: Diagnosis not present

## 2022-01-10 DIAGNOSIS — R2681 Unsteadiness on feet: Secondary | ICD-10-CM

## 2022-01-10 DIAGNOSIS — R2689 Other abnormalities of gait and mobility: Secondary | ICD-10-CM

## 2022-01-10 NOTE — Therapy (Signed)
[place] MAIN [facility] SERVICES [address] [place], [place], [address] Phone: [phone]   Fax:  [phone]  Occupational Therapy Treatment  Patient Details  Name: Cassidy Hernandez MRN: [id number] Date of Birth: [birth year] Referring Provider (OT): Dr. Frazier Richards   Encounter Date: 01/10/2022   OT End of Session - 01/10/22 1804     Visit Number 6    Number of Visits 24    Date for OT Re-Evaluation 03/13/22    OT Start Time 1300    OT Stop Time 1345    OT Time Calculation (min) 45 min    Activity Tolerance Patient tolerated treatment well    Behavior During Therapy [facility] for tasks assessed/performed             Past Medical History:  Diagnosis Date   Anginal pain ([place])    Aortic atherosclerosis    Cancer ([place])    Carotid artery stenosis 02/01/2010   a.) Doppler 43/15/4008: 67% LICA and 61% RICA. b.) Doppler 95/04/3266: >12% LICA and 45% RICA. c.) Doppler 10/25/16: >70% Bilater ICAs   Chronic bilateral low back pain with left-sided sciatica    CKD (chronic kidney disease), stage III ([place])    Coronary artery disease    a.) LHC 12/24/2014 -> small LAD system; diffuse CTO of LAD. 60% RCA. LM and LCx with no sig disease. no intervention; med mgmt.   Degenerative arthritis    Diastolic dysfunction    a.) TTE 09/20/2018: EF 55%, G1DD, mild LAE and RVE, triv-mild panvalvular regurgitation.   Dysarthria    GERD (gastroesophageal reflux disease)    Hepatic steatosis    History of kidney stones    HLD (hyperlipidemia)    Hypertension    Kidney stone    Lumbar radiculopathy    Major depression in remission ([place])    PVD (peripheral vascular disease) ([place])    Renal cyst, right 06/02/2015   a.) CT 06/02/2015 --> 4.4 cm RIGHT renal mass; MRI recommended. b.) MRI 06/16/2015 --> 3.8 x 4.2 x 4.0 cm proteinaceous/hemmorhagic cyst.   Subclavian steal syndrome    a.) LHC 12/24/2014 --> tight eccentric 90% ostial stenosis with damping.   T2DM  (type 2 diabetes mellitus) [facility])     Past Surgical History:  Procedure Laterality Date   ABDOMINAL HYSTERECTOMY     AUGMENTATION MAMMAPLASTY Bilateral 1980   BACK SURGERY  09/2018   BICEPT TENODESIS  06/10/2021   Procedure: BICEPS TENODESIS;  Surgeon: Corky Mull, MD;  Location: [facility];  Service: Orthopedics;;   CARDIAC CATHETERIZATION Left 12/24/2014   Procedure: LEFT HEARD CATHETERIZATION; Location: [facility]; Surgeon: Laurence Aly, MD   CHOLECYSTECTOMY     COLONOSCOPY N/A 02/11/2016   Procedure: COLONOSCOPY;  Surgeon: Manya Silvas, MD;  Location: [facility] ENDOSCOPY;  Service: Endoscopy;  Laterality: N/A;   IR CT HEAD LTD  11/16/2021   IR CT HEAD LTD  11/16/2021   IR INTRAVSC STENT CERV CAROTID W/O EMB-PROT MOD SED INC ANGIO  11/18/2021   IR PERCUTANEOUS ART THROMBECTOMY/INFUSION INTRACRANIAL INC DIAG ANGIO  11/16/2021   RADIOLOGY WITH ANESTHESIA N/A 11/16/2021   Procedure: IR WITH ANESTHESIA- CODE STROKE;  Surgeon: Radiologist, Medication, MD;  Location: [place];  Service: Radiology;  Laterality: N/A;   REVERSE SHOULDER ARTHROPLASTY Right 06/10/2021   Procedure: REVERSE SHOULDER ARTHROPLASTY;  Surgeon: Corky Mull, MD;  Location: [facility];  Service: Orthopedics;  Laterality: Right;    There were no vitals filed for this visit.  Subjective Assessment - 01/10/22 1803     Subjective  Pt.'s sister was present with the pt. today   Pertinent History Recent L MCA, s/p thrombectomy and ICA stenting    Limitations aphasia, balance deficits, generalized weakness, hx of falls.    Currently in Pain? No/denies            OT TREATMENT    Self-care:  Pt. Worked tub shower transfers using grab bars. Pt. Required CGA to get in, and out of the tub shower using horizontal grab bars at the front of the shower, and along the side of the shower. Education was provided to the pt., and her sister about placement of grab bars to assist with shower transfers safely. Pt. Attempted a 2nd time, however was  too fatigued, and unable to bring her right foot over the side of the tub.  Therapeutic Ex:  Pt. Worked on pinch strengthening in the right  hand for lateral, and 3pt. pinch using yellow, red, green, and blue resistive clips. Pt. worked on placing the clips  on a horizontal dowel. Tactile and verbal cues were required for eliciting the desired movement.    Neuro muscular re-education:   Pt. worked on [facility] skills grasping 1/2" flat washers, and placing them on the hooks. Pt. worked on right hand [facility] skills using the Building services engineer Task. Pt. worked on sustaining grasp on the resistive tweezers while grasping the sticks, and moving them from a horizontal position to a vertical position to prepare for placing them into the pegboard. Pt. required verbal cues, and cues for visual demonstration for wrist position, and hand pattern when placing them into the pegboard.    Pt. Required CGA for tub shower transfers for 1 trial. Pt. Fatigued with this, and was unable to bring her right leg over the side of the tub for the 2nd trial.  Pt. continues to present with limited balance during standing IADL tasks and tub transfers,  limited activity tolerance for tasks in standing, and limited [facility] skills. Pt. continues to work on improving strength, and [facility] skills in order to work towards improving, and maximizing independence with ADLs, and IADL tasks.                        OT Education - 01/10/22 1804     Education Details Tub shower transfers    Person(s) Educated Patient    Methods Explanation;Verbal cues;Demonstration    Comprehension Verbalized understanding;Verbal cues required;Need further instruction;Returned demonstration              OT Short Term Goals - 12/21/21 0946       OT SHORT TERM GOAL #1   Title Pt will be indep with theraputty HEP for increasing bilat grip/pinch strength.    Baseline Eval: not yet initiated    Time 6    Period Weeks    Status New     Target Date 01/30/22      OT SHORT TERM GOAL #2   Title Pt will be indep to verbalize 3 fall prevention strategies for safe ADL completion.    Baseline Eval: not yet initiated (hx of 4 falls in the last 6 months)    Time 6    Period Weeks    Status New    Target Date 01/30/22               OT Long Term Goals - 12/21/21 [phone]  OT LONG TERM GOAL #1   Title Pt will increase FOTO score to 45 or better.    Baseline Eval: FOTO 32 (OT estimates this # should be higher based on objective testing; likely inaccurate answer selections d/t aphasia)    Time 12    Period Weeks    Status New    Target Date 03/13/22      OT LONG TERM GOAL #2   Title Pt will increase bilat grip strength by 5 or more lbs for improved ability to hold and carry ADL supplies.    Baseline Eval: R 23, L 25    Time 12    Period Weeks    Status New    Target Date 03/13/22      OT LONG TERM GOAL #3   Title Pt will perform tub transfer with modified indep.    Baseline Eval: min-mod A (FOF)    Time 12    Period Weeks    Status New    Target Date 03/13/22      OT LONG TERM GOAL #4   Title Pt will manage hot/cold meal prep with modified indep    Baseline Eval: family currently managing meals    Time 12    Period Weeks    Status New    Target Date 03/13/22      OT LONG TERM GOAL #5   Title Pt will manage medications with modified indep    Baseline Eval: Pt and daughter manage together since CVA    Time 5    Period Weeks    Status New    Target Date 03/13/22      Long Term Additional Goals   Additional Long Term Goals Yes      OT LONG TERM GOAL #6   Title Pt will safely reach in all directions and planes with unilateral to no UE support in order to perform ADL/IADLs with LRD.    Baseline Eval: Pt using rollator and has not yet attempted IADL tasks (family managing IADLs)    Time 12    Period Weeks    Status New    Target Date 03/13/22                   Plan - 01/10/22 1805      Clinical Impression Statement Pt. Required CGA for tub shower transfers for 1 trial. Pt. Fatigued with this, and was unable to bring her right leg over the side of the tub for the 2nd trial.  Pt. continues to present with limited balance during standing IADL tasks and tub transfers,  limited activity tolerance for tasks in standing, and limited [facility] skills. Pt. continues to work on improving strength, and [facility] skills in order to work towards improving, and maximizing independence with ADLs, and IADL tasks.        OT Occupational Profile and History Problem Focused Assessment - Including review of records relating to presenting problem    Occupational performance deficits (Please refer to evaluation for details): ADL's;IADL's;Leisure;Social Participation    Body Structure / Function / Physical Skills ADL;Coordination;Endurance;GMC;UE functional use;Balance;Decreased knowledge of precautions;IADL;Strength;Dexterity;FMC;Gait;Mobility    Cognitive Skills Understand    Rehab Potential Good    Clinical Decision Making Several treatment options, min-mod task modification necessary    Comorbidities Affecting Occupational Performance: None    Modification or Assistance to Complete Evaluation  Min-Moderate modification of tasks or assist with assess necessary to complete eval   extra time to process commands and  formulate answers to questions d/t aphasia   OT Frequency 2x / week    OT Duration 12 weeks    OT Treatment/Interventions Self-care/ADL training;Therapeutic exercise;DME and/or AE instruction;Functional Mobility Training;Balance training;Neuromuscular education;Therapeutic activities;Patient/family education;Coping strategies training    Plan OT focus on UB strengthening, dynamic standing balance, IADLs, med management    OT Home Exercise Plan not yet initiated    Consulted and Agree with Plan of Care Patient;Family member/caregiver             Patient will benefit from skilled therapeutic  intervention in order to improve the following deficits and impairments:   Body Structure / Function / Physical Skills: ADL, Coordination, Endurance, GMC, UE functional use, Balance, Decreased knowledge of precautions, IADL, Strength, Dexterity, FMC, Gait, Mobility Cognitive Skills: Understand     Visit Diagnosis: Muscle weakness (generalized)  Other lack of coordination    Problem List Patient Active Problem List   Diagnosis Date Noted   Acute ischemic left MCA stroke ([place]) 11/16/2021   Middle cerebral artery embolism, 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   Status post reverse arthroplasty of shoulder, right 06/10/2021   Harrel Carina, MS, OTR/L   Harrel Carina, OT 01/10/2022, 6:06 PM  [place] MAIN [facility] SERVICES [address] [place], [place], [address] Phone: [phone]   Fax:  [phone]  Name: Cassidy Hernandez MRN: [id number] Date of Birth: [birth year]

## 2022-01-10 NOTE — Therapy (Signed)
[place] MAIN [facility] SERVICES [address] [place], [place], [address] Phone: [phone]   Fax:  [phone]  Physical Therapy Treatment  Patient Details  Name: Cassidy Hernandez MRN: [id number] Date of Birth: 29-Jun-1941 Referring Provider (PT): Merrilee Seashore, PA-C   Encounter Date: 01/10/2022   PT End of Session - 01/10/22 1628     Visit Number 6    Number of Visits 24    Date for PT Re-Evaluation 03/15/22    Authorization Type BCBS Medicare    Authorization Time Period 12/21/21-03/15/22    Progress Note Due on Visit 10    PT Start Time 1347    PT Stop Time 1430    PT Time Calculation (min) 43 min    Equipment Utilized During Treatment Gait belt    Activity Tolerance Patient tolerated treatment well;No increased pain    Behavior During Therapy [facility] for tasks assessed/performed             Past Medical History:  Diagnosis Date   Anginal pain ([place])    Aortic atherosclerosis    Cancer ([place])    Carotid artery stenosis 02/01/2010   a.) Doppler 66/44/0347: 42% LICA and 59% RICA. b.) Doppler 56/38/7564: >33% LICA and 29% RICA. c.) Doppler 10/25/16: >70% Bilater ICAs   Chronic bilateral low back pain with left-sided sciatica    CKD (chronic kidney disease), stage III ([facility])    Coronary artery disease    a.) LHC 12/24/2014 -> small LAD system; diffuse CTO of LAD. 60% RCA. LM and LCx with no sig disease. no intervention; med mgmt.   Degenerative arthritis    Diastolic dysfunction    a.) TTE 09/20/2018: EF 55%, G1DD, mild LAE and RVE, triv-mild panvalvular regurgitation.   Dysarthria    GERD (gastroesophageal reflux disease)    Hepatic steatosis    History of kidney stones    HLD (hyperlipidemia)    Hypertension    Kidney stone    Lumbar radiculopathy    Major depression in remission ([place])    PVD (peripheral vascular disease) ([place])    Renal cyst, right 06/02/2015   a.) CT 06/02/2015 --> 4.4 cm RIGHT renal mass; MRI recommended. b.) MRI  06/16/2015 --> 3.8 x 4.2 x 4.0 cm proteinaceous/hemmorhagic cyst.   Subclavian steal syndrome    a.) LHC 12/24/2014 --> tight eccentric 90% ostial stenosis with damping.   T2DM (type 2 diabetes mellitus) [facility])     Past Surgical History:  Procedure Laterality Date   ABDOMINAL HYSTERECTOMY     AUGMENTATION MAMMAPLASTY Bilateral 1980   BACK SURGERY  09/2018   BICEPT TENODESIS  06/10/2021   Procedure: BICEPS TENODESIS;  Surgeon: Corky Mull, MD;  Location: [facility] [facility];  Service: Orthopedics;;   CARDIAC CATHETERIZATION Left 12/24/2014   Procedure: LEFT HEARD CATHETERIZATION; Location: [facility]; Surgeon: Laurence Aly, MD   CHOLECYSTECTOMY     COLONOSCOPY N/A 02/11/2016   Procedure: COLONOSCOPY;  Surgeon: Manya Silvas, MD;  Location: [facility];  Service: Endoscopy;  Laterality: N/A;   IR CT HEAD LTD  11/16/2021   IR CT HEAD LTD  11/16/2021   IR INTRAVSC STENT CERV CAROTID W/O EMB-PROT MOD SED INC ANGIO  11/18/2021   IR PERCUTANEOUS ART THROMBECTOMY/INFUSION INTRACRANIAL INC DIAG ANGIO  11/16/2021   RADIOLOGY WITH ANESTHESIA N/A 11/16/2021   Procedure: IR WITH ANESTHESIA- CODE STROKE;  Surgeon: Radiologist, Medication, MD;  Location: [place];  Service: Radiology;  Laterality: N/A;   REVERSE SHOULDER ARTHROPLASTY Right 06/10/2021  Procedure: REVERSE SHOULDER ARTHROPLASTY;  Surgeon: Corky Mull, MD;  Location: [facility];  Service: Orthopedics;  Laterality: Right;    There were no vitals filed for this visit.   Subjective Assessment - 01/10/22 1626     Subjective Patient reports continued soreness from compliance with current HEP. She also brought in another pair of shoes to practice her balance in today.    Pertinent History Pt Recent L MCA, s/p thrombectomy and ICA stenting. Pt with speech deficits. Pt has been somewhat unsteady with a few falls falling backward however DTR reports this is not new, but has been ongoing. Pt seen at our Sports clinic in 2018 fo rlow back pain issues in the setting  of spintal stenosis.    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 No/denies            Patient brought in another pair of shoes- to practice as she does not like her tennis shoes.   Neuromuscular re-ed:   Gait in clinic using [phone]- Patient performed approx 200 feet and stated the shoes felt great- "Better than those Tennis shoes." No LOB or posterior leaning.   Dynamic Step tap onto 1st step (initially with 2 UE support- progressed to 1 UE and then no UE support) -very unstable initially but did improve with practice - 20 steps each LE.   Staggered standing - x 30 sec- eyes open without UE Support- Patient performed well without LOB  Near tandem stand x 30 sec x multiple attempts- 10-20 sec hold - with increased overall unsteadiness.   Dynamic A/P Weight shift at support bar- Patient was able to weight shift both ant and post - without retropulsion.   Dynamic Lateral weight shift at support bar- Patient with no unsteadiness today and no LOB  Static stand with multidirectional pertubation A/P/Lateral push by PT to test static balance. No unsteady and patient able to use ankle strategy well without difficulty.   Side step over 1/2 foam - initially with BUE support- eventually down to 1 UE support - Difficulty taking a wide step and coordinating step with increased unsteadiness.   Education provided throughout session via VC/TC and demonstration to facilitate movement at target joints and correct muscle activation for all testing and exercises performed.                            PT Education - 01/10/22 1627     Education provided Yes    Education Details Exercise technique     Person(s) Educated Patient    Methods Explanation;Demonstration;Tactile cues;Verbal cues    Comprehension Verbalized understanding;Returned demonstration;Verbal cues required;Tactile cues required;Need further instruction              PT Short Term Goals - 12/21/21 1508       PT SHORT TERM GOAL #1   Title Independence in HEP for improved balance and strength    Baseline Issued at eval    Time 3    Period Weeks    Status New    Target Date 01/11/22      PT SHORT TERM GOAL #2   Title 5xSTS hands free <19sec    Baseline Eval: >24sec hands  free    Time 4    Period Weeks    Status New    Target Date 01/18/22      PT SHORT TERM GOAL #3   Title Ankle DF MMT 5/5 bilat.    Baseline Eval: 4+/5 Right, 4/5 Left    Time 4    Period Weeks    Status New    Target Date 01/18/22               PT Long Term Goals - 12/21/21 1509       PT LONG TERM GOAL #1   Title 6MWT > 1274f c LRAD    Baseline July 2019: 13227f   Time 8    Period Weeks    Status New    Target Date 02/15/22      PT LONG TERM GOAL #2   Title 5xSTS <13 sec hands free    Baseline eval: >24sec hands free    Time 8    Period Weeks    Status New    Target Date 02/15/22      PT LONG TERM GOAL #3   Title Pt to demonstrate retroAMB without device >0.2559ms LOB to demonstrate improved ankle righting and control.    Baseline Eval: <0.24m37mnd c 3 LOB    Time 10    Period Weeks    Status New    Target Date 03/01/22      PT LONG TERM GOAL #4   Title FOTO Score increase >15    Baseline eval: 48    Time 12    Period Weeks    Status New    Target Date 03/15/22                   Plan - 01/10/22 1628     Clinical Impression Statement Patient presents today with improving overall dynamic standing balance-new shoes worked well and patient exhibited no posterior loss of balance or significant difficulty with retropulsion as seen in past visits. She was challenged with step tap and step over  with some unsteadiness. She will continue to benefit from balance training but new shoes worked well overall today. PT will beneift from continued skilled PT interventions to address impairments in order to maximize independence and safety in ADL/IADL and to reduce risk of falls    Personal Factors and Comorbidities Past/Current Experience    Examination-Activity Limitations Dressing;Transfers;Bend;Locomotion Level;Stairs;Stand    Examination-Participation Restrictions Cleaning;Shop;Meal Prep;[employer];Laundry    Stability/Clinical Decision Making Evolving/Moderate complexity    Rehab Potential Good    Clinical Impairments Affecting Rehab Potential (+) motivation, social support (-) age, chronicity of pain, fairly sedentary lifestyle    PT Frequency 2x / week    PT Duration 12 weeks    PT Treatment/Interventions Aquatic Therapy;Moist Heat;Functional mobility training;Dry needling;Neuromuscular re-education;Traction;Ultrasound;Iontophoresis '4mg'$ /ml Dexamethasone;ADLs/Self Care Home Management;Cryotherapy;Electrical Stimulation;Therapeutic exercise;Therapeutic activities;Patient/family education;Taping;Gait training;Stair training;DME Instruction;Balance training;Passive range of motion    PT Next Visit Plan Progress safety with mobility and LE strengthening as appropriate.    PT Home Exercise Plan Eval: seated heel raises, toe raises, and STS from elevated surface. 01/04/2022=Access Code: [email or web address]: [email or web address]    Consulted and Agree with Plan of Care Patient;Family member/caregiver    Family Member Consulted [place]             Patient will benefit from skilled therapeutic intervention in order to improve the following deficits and impairments:  Decreased range of motion, Difficulty walking, Abnormal gait, Decreased  balance, Decreased mobility, Decreased activity tolerance, Decreased knowledge of use of DME, Decreased strength, Dizziness  Visit Diagnosis: Muscle  weakness (generalized)  Other lack of coordination  Unsteadiness on feet  Abnormality of gait and mobility  Difficulty in walking, not elsewhere classified  Other abnormalities of gait and mobility     Problem List Patient Active Problem List   Diagnosis Date Noted   Acute ischemic left MCA stroke ([place]) 11/16/2021   Middle cerebral artery embolism, 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   Status post reverse arthroplasty of shoulder, right 06/10/2021    Lewis Moccasin, PT 01/10/2022, 4:44 PM  [place] MAIN [facility] SERVICES [place], [place], [address] Phone: [phone]   Fax:  [phone]  Name: Cassidy Hernandez MRN: [id number] Date of Birth: 09-22-1940

## 2022-01-12 ENCOUNTER — Ambulatory Visit: Payer: Medicare Other | Admitting: Speech Pathology

## 2022-01-12 ENCOUNTER — Ambulatory Visit: Payer: Medicare Other

## 2022-01-12 DIAGNOSIS — R296 Repeated falls: Secondary | ICD-10-CM

## 2022-01-12 DIAGNOSIS — R471 Dysarthria and anarthria: Secondary | ICD-10-CM

## 2022-01-12 DIAGNOSIS — R2689 Other abnormalities of gait and mobility: Secondary | ICD-10-CM

## 2022-01-12 DIAGNOSIS — R269 Unspecified abnormalities of gait and mobility: Secondary | ICD-10-CM

## 2022-01-12 DIAGNOSIS — R2681 Unsteadiness on feet: Secondary | ICD-10-CM

## 2022-01-12 DIAGNOSIS — R278 Other lack of coordination: Secondary | ICD-10-CM

## 2022-01-12 DIAGNOSIS — R262 Difficulty in walking, not elsewhere classified: Secondary | ICD-10-CM

## 2022-01-12 DIAGNOSIS — M6281 Muscle weakness (generalized): Secondary | ICD-10-CM

## 2022-01-12 DIAGNOSIS — R4701 Aphasia: Secondary | ICD-10-CM

## 2022-01-12 NOTE — Therapy (Signed)
[place] MAIN [facility] SERVICES [address] [place], [place], [address] Phone: [phone]   Fax:  [phone]  Speech Language Pathology Treatment  Patient Details  Name: Cassidy Hernandez MRN: [id number] Date of Birth: 1941-05-09 Referring Provider (SLP): Merrilee Seashore, NP   Encounter Date: 01/12/2022   End of Session - 01/12/22 1504     Visit Number 7    Number of Visits 25    Date for SLP Re-Evaluation 03/16/22    Authorization Type BCBS MCR; $10 copay    Authorization - Visit Number 7    Progress Note Due on Visit 10    SLP Start Time 1100    SLP Stop Time  1200    SLP Time Calculation (min) 60 min    Activity Tolerance Patient tolerated treatment well             Past Medical History:  Diagnosis Date   Anginal pain ([place])    Aortic atherosclerosis    Cancer ([place])    Carotid artery stenosis 02/01/2010   a.) Doppler 32/20/2542: 70% LICA and 62% RICA. b.) Doppler 37/62/8315: >17% LICA and 61% RICA. c.) Doppler 10/25/16: >70% Bilater ICAs   Chronic bilateral low back pain with left-sided sciatica    CKD (chronic kidney disease), stage III ([place])    Coronary artery disease    a.) LHC 12/24/2014 -> small LAD system; diffuse CTO of LAD. 60% RCA. LM and LCx with no sig disease. no intervention; med mgmt.   Degenerative arthritis    Diastolic dysfunction    a.) TTE 09/20/2018: EF 55%, G1DD, mild LAE and RVE, triv-mild panvalvular regurgitation.   Dysarthria    GERD (gastroesophageal reflux disease)    Hepatic steatosis    History of kidney stones    HLD (hyperlipidemia)    Hypertension    Kidney stone    Lumbar radiculopathy    Major depression in remission ([place])    PVD (peripheral vascular disease) ([place])    Renal cyst, right 06/02/2015   a.) CT 06/02/2015 --> 4.4 cm RIGHT renal mass; MRI recommended. b.) MRI 06/16/2015 --> 3.8 x 4.2 x 4.0 cm proteinaceous/hemmorhagic cyst.   Subclavian steal syndrome    a.) LHC 12/24/2014 --> tight  eccentric 90% ostial stenosis with damping.   T2DM (type 2 diabetes mellitus) [facility])     Past Surgical History:  Procedure Laterality Date   ABDOMINAL HYSTERECTOMY     AUGMENTATION MAMMAPLASTY Bilateral 1980   BACK SURGERY  09/2018   BICEPT TENODESIS  06/10/2021   Procedure: BICEPS TENODESIS;  Surgeon: Corky Mull, MD;  Location: [facility];  Service: Orthopedics;;   CARDIAC CATHETERIZATION Left 12/24/2014   Procedure: LEFT HEARD CATHETERIZATION; Location: [facility]; Surgeon: Laurence Aly, MD   CHOLECYSTECTOMY     COLONOSCOPY N/A 02/11/2016   Procedure: COLONOSCOPY;  Surgeon: Manya Silvas, MD;  Location: [facility];  Service: Endoscopy;  Laterality: N/A;   IR CT HEAD LTD  11/16/2021   IR CT HEAD LTD  11/16/2021   IR INTRAVSC STENT CERV CAROTID W/O EMB-PROT MOD SED INC ANGIO  11/18/2021   IR PERCUTANEOUS ART THROMBECTOMY/INFUSION INTRACRANIAL INC DIAG ANGIO  11/16/2021   RADIOLOGY WITH ANESTHESIA N/A 11/16/2021   Procedure: IR WITH ANESTHESIA- CODE STROKE;  Surgeon: Radiologist, Medication, MD;  Location: [facility];  Service: Radiology;  Laterality: N/A;   REVERSE SHOULDER ARTHROPLASTY Right 06/10/2021   Procedure: REVERSE SHOULDER ARTHROPLASTY;  Surgeon: Corky Mull, MD;  Location: [facility];  Service:  Orthopedics;  Laterality: Right;    There were no vitals filed for this visit.   Subjective Assessment - 01/12/22 1503     Subjective "I called [facility]."    Currently in Pain? No/denies                   ADULT SLP TREATMENT - 01/12/22 1503       General Information   Behavior/Cognition Alert;Cooperative;Pleasant mood    HPI Pt is a 81 y.o. 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 Patient is s/p thrombectomy with left ICA stent placement 11/16/21. She completed inpatient rehabilitation at [facility] 11/25/21-12/07/21 and was discharged home with family. Patient known to this clinic from prior course of voice therapy in 2018. Pt with documented hx of voice and  speech changes (dysarthria) per videostroboscopy 12/26/17; did not attend therapy.      Treatment Provided   Treatment provided Cognitive-Linquistic      Pain Assessment   Pain Assessment No/denies pain      Cognitive-Linquistic Treatment   Treatment focused on Aphasia;Patient/family/caregiver education    Skilled Treatment SLP and graduate clinician targeted verbal expression (wordfinding, sentence generation) in structured task using [place]. Patient generated 3-4 subjects and 3-4 objects for given verbs (push, read) with occasional mod cues. Patient benefitted from cues for semantic feature analysis to use description when anomia occurred. Pt generated expanded sentences of 5-10 words 80% accuracy with mod cues (syntax, spelling, paraphasias). Targeted simple conversation with occasional min cues necessary to use compensations (slow rate, description). Homework tasks reviewed/provided.      Assessment / Recommendations / Plan   Plan Continue with current plan of care      Progression Toward Goals   Progression toward goals Progressing toward goals              SLP Education - 01/12/22 1504     Education Details self-advocating/disclosure to increase comfort with phone calls    Person(s) Educated Patient    Methods Explanation    Comprehension Verbalized understanding              SLP Short Term Goals - 12/16/21 1921       SLP SHORT TERM GOAL #1   Title Pt will communicate emergency information 100% accuracy using visual aid if necessary.    Time 6    Period Weeks    Status New    Target Date 01/27/22      SLP SHORT TERM GOAL #2   Title Pt will generate at least 4 descriptors of target word 80% of the time using semantic feature analysis to improve abilities in wordfinding and resolving communication breakdowns.    Time 6    Period Weeks    Status New    Target Date 01/27/22      SLP SHORT TERM GOAL #3   Title Patient will  request repetition/rephrasing to aid auditory comprehension of mod complex information (multi-step commands, conversation) in 80% of opportunities.    Time 6    Period Weeks    Status New    Target Date 01/27/22      SLP SHORT TERM GOAL #4   Title Pt will read and reply to simple texts/emails 80% accuracy with use of accessibility features as necessary.    Time 6    Period Weeks    Status New    Target Date 01/27/22  SLP Long Term Goals - 12/16/21 1926       SLP LONG TERM GOAL #1   Title Pt will engage in 5-8 minutes simple-mod complex conversation re: topic of interest with supported conversation, aphasia compensations.    Time 12    Period Weeks    Status New    Target Date 03/16/22      SLP LONG TERM GOAL #2   Title Pt will ID and attempt repair of communication breakdowns >80% of the time in session.    Time 12    Period Weeks    Status New    Target Date 03/16/22      SLP LONG TERM GOAL #3   Title Pt will demonstrate reading comprehension of mod complex 8-10 paragraphs by generating summary or answering questions >80% accuracy.    Time 12    Period Weeks    Status New              Plan - 01/12/22 1504     Clinical Impression Statement Pt continues with moderate aphasia with expressive>receptive deficits, including impairments in verbal expression, reading, writing, and to a lesser extent, auditory comprehension. Pt continues with wordfinding difficulty and hesitation, however verbal fluency and error correction improving in simple conversation. Pt reports being able to make a phone call to cable company to resolve an issue with her television. Generated sentences 5-10 words in structured task today with mod-max cues. Patient expresses desire to improve communication with family and friends and to resume hobbies and interests such as reading and watching TV. I recommend skilled ST to improve pt's communication skills to increase safety, independence  and quality of life.    Speech Therapy Frequency 2x / week    Duration 2 weeks    Potential to Achieve Goals Good    Potential Considerations Family/community support    SLP Home Exercise Plan [facility] Therapy sign-up and tasks provided    Consulted and Agree with Plan of Care Patient;Family member/caregiver             Patient will benefit from skilled therapeutic intervention in order to improve the following deficits and impairments:   Aphasia  Dysarthria and anarthria    Problem List Patient Active Problem List   Diagnosis Date Noted   Acute ischemic left MCA stroke ([place]) 11/16/2021   Middle cerebral artery embolism, 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   Status post reverse arthroplasty of shoulder, right 06/10/2021   Deneise Lever, MS, CCC-SLP Speech-Language Pathologist [phone]  Aliene Altes, [place] 01/12/2022, 3:40 PM  [place] MAIN [facility] SERVICES [address] [place], [place], [address] Phone: [phone]   Fax:  [phone]   Name: ADALEA HANDLER MRN: [id number] Date of Birth: March 14, 1941

## 2022-01-12 NOTE — Therapy (Signed)
[place] MAIN [facility] SERVICES [address] [place], [place], [address] Phone: [phone]   Fax:  [phone]  Physical Therapy Treatment  Patient Details  Name: Cassidy Hernandez MRN: [id number] Date of Birth: 1941/06/06 Referring Provider (PT): Merrilee Seashore, PA-C   Encounter Date: 01/12/2022   PT End of Session - 01/12/22 0813     Visit Number 7    Number of Visits 24    Date for PT Re-Evaluation 03/15/22    Authorization Type BCBS Medicare    Authorization Time Period 12/21/21-03/15/22    Progress Note Due on Visit 10    PT Start Time 1258    PT Stop Time 1344    PT Time Calculation (min) 46 min    Equipment Utilized During Treatment Gait belt    Activity Tolerance Patient tolerated treatment well;No increased pain    Behavior During Therapy [facility] for tasks assessed/performed             Past Medical History:  Diagnosis Date   Anginal pain ([place])    Aortic atherosclerosis    Cancer ([place])    Carotid artery stenosis 02/01/2010   a.) Doppler 85/09/7739: 28% LICA and 78% RICA. b.) Doppler 67/67/2094: >70% LICA and 96% RICA. c.) Doppler 10/25/16: >70% Bilater ICAs   Chronic bilateral low back pain with left-sided sciatica    CKD (chronic kidney disease), stage III ([facility])    Coronary artery disease    a.) LHC 12/24/2014 -> small LAD system; diffuse CTO of LAD. 60% RCA. LM and LCx with no sig disease. no intervention; med mgmt.   Degenerative arthritis    Diastolic dysfunction    a.) TTE 09/20/2018: EF 55%, G1DD, mild LAE and RVE, triv-mild panvalvular regurgitation.   Dysarthria    GERD (gastroesophageal reflux disease)    Hepatic steatosis    History of kidney stones    HLD (hyperlipidemia)    Hypertension    Kidney stone    Lumbar radiculopathy    Major depression in remission ([place])    PVD (peripheral vascular disease) ([place])    Renal cyst, right 06/02/2015   a.) CT 06/02/2015 --> 4.4 cm RIGHT renal mass; MRI recommended. b.) MRI  06/16/2015 --> 3.8 x 4.2 x 4.0 cm proteinaceous/hemmorhagic cyst.   Subclavian steal syndrome    a.) LHC 12/24/2014 --> tight eccentric 90% ostial stenosis with damping.   T2DM (type 2 diabetes mellitus) [facility])     Past Surgical History:  Procedure Laterality Date   ABDOMINAL HYSTERECTOMY     AUGMENTATION MAMMAPLASTY Bilateral 1980   BACK SURGERY  09/2018   BICEPT TENODESIS  06/10/2021   Procedure: BICEPS TENODESIS;  Surgeon: Corky Mull, MD;  Location: [facility];  Service: Orthopedics;;   CARDIAC CATHETERIZATION Left 12/24/2014   Procedure: LEFT HEARD CATHETERIZATION; Location: [facility]; Surgeon: Laurence Aly, MD   CHOLECYSTECTOMY     COLONOSCOPY N/A 02/11/2016   Procedure: COLONOSCOPY;  Surgeon: Manya Silvas, MD;  Location: [facility];  Service: Endoscopy;  Laterality: N/A;   IR CT HEAD LTD  11/16/2021   IR CT HEAD LTD  11/16/2021   IR INTRAVSC STENT CERV CAROTID W/O EMB-PROT MOD SED INC ANGIO  11/18/2021   IR PERCUTANEOUS ART THROMBECTOMY/INFUSION INTRACRANIAL INC DIAG ANGIO  11/16/2021   RADIOLOGY WITH ANESTHESIA N/A 11/16/2021   Procedure: IR WITH ANESTHESIA- CODE STROKE;  Surgeon: Radiologist, Medication, MD;  Location: [facility];  Service: Radiology;  Laterality: N/A;   REVERSE SHOULDER ARTHROPLASTY Right 06/10/2021  Procedure: REVERSE SHOULDER ARTHROPLASTY;  Surgeon: Corky Mull, MD;  Location: [facility];  Service: Orthopedics;  Laterality: Right;    There were no vitals filed for this visit.   Subjective Assessment - 01/12/22 1258     Subjective Patient reports feeling pretty good today other than the rain. Denies any falls or pain    Pertinent History Pt Recent L MCA, s/p thrombectomy and ICA stenting. Pt with speech deficits. Pt has been somewhat unsteady with a few falls falling backward however DTR reports this is not new, but has been ongoing. Pt seen at our Sports clinic in 2018 fo rlow back pain issues in the setting of spintal stenosis.    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 No/denies            BP = 170/67 mmHg Right UE sitting at rest.   INTERVENTIONS:  Therex:  Seated LE strengthening:   Hip march 2.5 lb BLE x 12 reps Knee ext 2.5 lb BLE x 12 reps Hip add ball squeeze with 3 sec hold x 10 reps.  Hip flex/abd/add up/over spike ball x 10 reps each leg 2.5 AW Ham curl with Blue TB Toe raises 2.5 lb AW x 15 rep Heel raises 2.5 lb AW x 15 reps  BP= 153/75 mmHg   2nd round  of all above exercises = 10 more reps each LE.   Education provided throughout session via VC/TC and demonstration to facilitate movement at target joints and correct muscle activation for all testing and exercises performed. Rationale for Evaluation and Treatment Rehabilitation                          PT Education - 01/13/22 [id number]     Education provided Yes    Education Details Exercise technique    Person(s) Educated Patient    Methods Explanation;Demonstration;Tactile cues;Verbal cues    Comprehension Verbalized understanding;Returned demonstration;Verbal cues required;Tactile cues required;Need further instruction              PT Short Term Goals - 12/21/21 1508       PT SHORT TERM GOAL #1   Title Independence in HEP for improved balance and strength    Baseline Issued at eval    Time 3    Period Weeks    Status New    Target Date 01/11/22      PT SHORT TERM GOAL #2   Title 5xSTS hands free <19sec    Baseline Eval: >24sec hands free    Time 4    Period Weeks    Status New    Target Date 01/18/22      PT SHORT TERM GOAL #3   Title Ankle DF MMT 5/5 bilat.    Baseline Eval: 4+/5 Right, 4/5 Left    Time 4    Period Weeks    Status New     Target Date 01/18/22               PT Long Term Goals - 12/21/21 1509       PT LONG TERM GOAL #1   Title 6MWT > 1280f c LRAD    Baseline July 2019: 13258f  Time 8    Period Weeks    Status New    Target Date 02/15/22      PT LONG TERM GOAL #2   Title 5xSTS <13 sec hands free    Baseline eval: >24sec hands free    Time 8    Period Weeks    Status New    Target Date 02/15/22      PT LONG TERM GOAL #3   Title Pt to demonstrate retroAMB without device >0.16ms s LOB to demonstrate improved ankle righting and control.    Baseline Eval: <0.021m and c 3 LOB    Time 10    Period Weeks    Status New    Target Date 03/01/22      PT LONG TERM GOAL #4   Title FOTO Score increase >15    Baseline eval: 48    Time 12    Period Weeks    Status New    Target Date 03/15/22                   Plan - 01/12/22 0814     Clinical Impression Statement Patient presents with good motivation for today's session. Treatment focused on educating/instructing on LE strengthening to assist with improving overall mobility and balance. Patient was able to complete 2 rounds of seated therex with VC and visual demo for correct technique. She exhibited fatigue as only limiting factor- denying any pain. Only minimal rest break required for completion of tasks. PT will beneift from continued skilled PT interventions to address impairments in order to maximize independence and safety in ADL/IADL and to reduce risk of falls    Personal Factors and Comorbidities Past/Current Experience    Examination-Activity Limitations Dressing;Transfers;Bend;Locomotion Level;Stairs;Stand    Examination-Participation Restrictions Cleaning;Shop;Meal Prep;[place];Laundry    Stability/Clinical Decision Making Evolving/Moderate complexity    Rehab Potential Good    Clinical Impairments Affecting Rehab Potential (+) motivation, social support (-) age, chronicity of pain, fairly sedentary lifestyle    PT  Frequency 2x / week    PT Duration 12 weeks    PT Treatment/Interventions Aquatic Therapy;Moist Heat;Functional mobility training;Dry needling;Neuromuscular re-education;Traction;Ultrasound;Iontophoresis '4mg'$ /ml Dexamethasone;ADLs/Self Care Home Management;Cryotherapy;Electrical Stimulation;Therapeutic exercise;Therapeutic activities;Patient/family education;Taping;Gait training;Stair training;DME Instruction;Balance training;Passive range of motion    PT Next Visit Plan Progress safety with mobility and LE strengthening as appropriate.    PT Home Exercise Plan Eval: seated heel raises, toe raises, and STS from elevated surface. 01/04/2022=Access Code: [email or web address]: [email or web address]    Consulted and Agree with Plan of Care Patient;Family member/caregiver    Family Member Consulted [place]             Patient will benefit from skilled therapeutic intervention in order to improve the following deficits and impairments:  Decreased range of motion, Difficulty walking, Abnormal gait, Decreased balance, Decreased mobility, Decreased activity tolerance, Decreased knowledge of use of DME, Decreased strength, Dizziness  Visit Diagnosis: Muscle weakness (generalized)  Other lack of coordination  Unsteadiness on feet  Abnormality of gait and mobility  Difficulty in walking, not elsewhere classified  Other abnormalities of gait and mobility  Repeated falls     Problem List Patient Active Problem List   Diagnosis Date Noted   Acute ischemic left MCA stroke ([place]04/06/2022   Middle cerebral artery embolism, 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   Status post reverse arthroplasty of shoulder, right 06/10/2021    JeLewis MoccasinPT 01/13/2022, 8:26 AM  [place] [facility] [address]  [place], [place], [address] Phone: [phone]   Fax:  [phone]  Name: CHAD TIZNADO MRN: [id number] Date of Birth: [birth year]

## 2022-01-13 ENCOUNTER — Ambulatory Visit: Payer: Medicare Other | Admitting: Speech Pathology

## 2022-01-13 DIAGNOSIS — R4701 Aphasia: Secondary | ICD-10-CM

## 2022-01-13 NOTE — Therapy (Signed)
[place] MAIN [facility] SERVICES [address] [place], [place], [address] Phone: [phone]   Fax:  [phone]  Speech Language Pathology Treatment  Patient Details  Name: Cassidy Hernandez MRN: [id number] Date of Birth: Jan 03, 1941 Referring Provider (SLP): Merrilee Seashore, NP   Encounter Date: 01/13/2022   End of Session - 01/13/22 1554     Visit Number 8    Number of Visits 25    Date for SLP Re-Evaluation 03/16/22    Authorization Type BCBS MCR; $10 copay    Authorization - Visit Number 8    Progress Note Due on Visit 10    SLP Start Time 1100    SLP Stop Time  1200    SLP Time Calculation (min) 60 min    Activity Tolerance Patient tolerated treatment well             Past Medical History:  Diagnosis Date   Anginal pain ([place])    Aortic atherosclerosis    Cancer ([place])    Carotid artery stenosis 02/01/2010   a.) Doppler 89/21/1941: 74% LICA and 08% RICA. b.) Doppler 14/48/1856: >31% LICA and 49% RICA. c.) Doppler 10/25/16: >70% Bilater ICAs   Chronic bilateral low back pain with left-sided sciatica    CKD (chronic kidney disease), stage III ([place])    Coronary artery disease    a.) LHC 12/24/2014 -> small LAD system; diffuse CTO of LAD. 60% RCA. LM and LCx with no sig disease. no intervention; med mgmt.   Degenerative arthritis    Diastolic dysfunction    a.) TTE 09/20/2018: EF 55%, G1DD, mild LAE and RVE, triv-mild panvalvular regurgitation.   Dysarthria    GERD (gastroesophageal reflux disease)    Hepatic steatosis    History of kidney stones    HLD (hyperlipidemia)    Hypertension    Kidney stone    Lumbar radiculopathy    Major depression in remission ([place])    PVD (peripheral vascular disease) ([place])    Renal cyst, right 06/02/2015   a.) CT 06/02/2015 --> 4.4 cm RIGHT renal mass; MRI recommended. b.) MRI 06/16/2015 --> 3.8 x 4.2 x 4.0 cm proteinaceous/hemmorhagic cyst.   Subclavian steal syndrome    a.) LHC 12/24/2014 --> tight  eccentric 90% ostial stenosis with damping.   T2DM (type 2 diabetes mellitus) [facility])     Past Surgical History:  Procedure Laterality Date   ABDOMINAL HYSTERECTOMY     AUGMENTATION MAMMAPLASTY Bilateral 1980   BACK SURGERY  09/2018   BICEPT TENODESIS  06/10/2021   Procedure: BICEPS TENODESIS;  Surgeon: Corky Mull, MD;  Location: [facility];  Service: Orthopedics;;   CARDIAC CATHETERIZATION Left 12/24/2014   Procedure: LEFT HEARD CATHETERIZATION; Location: [facility]; Surgeon: Laurence Aly, MD   CHOLECYSTECTOMY     COLONOSCOPY N/A 02/11/2016   Procedure: COLONOSCOPY;  Surgeon: Manya Silvas, MD;  [facility];  Service: Endoscopy;  Laterality: N/A;   IR CT HEAD LTD  11/16/2021   IR CT HEAD LTD  11/16/2021   IR INTRAVSC STENT CERV CAROTID W/O EMB-PROT MOD SED INC ANGIO  11/18/2021   IR PERCUTANEOUS ART THROMBECTOMY/INFUSION INTRACRANIAL INC DIAG ANGIO  11/16/2021   RADIOLOGY WITH ANESTHESIA N/A 11/16/2021   Procedure: IR WITH ANESTHESIA- CODE STROKE;  Surgeon: Radiologist, Medication, MD;  Location: [place];  Service: Radiology;  Laterality: N/A;   REVERSE SHOULDER ARTHROPLASTY Right 06/10/2021   Procedure: REVERSE SHOULDER ARTHROPLASTY;  Surgeon: Corky Mull, MD;  Location: [facility];  Service:  Orthopedics;  Laterality: Right;    There were no vitals filed for this visit.   Subjective Assessment - 01/13/22 1107     Subjective "Vegetable... i don't know."    Currently in Pain? No/denies                   ADULT SLP TREATMENT - 01/13/22 1534       General Information   Behavior/Cognition Alert;Cooperative;Pleasant mood    HPI Pt is a 81 y.o. 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 Patient is s/p thrombectomy with left ICA stent placement 11/16/21. She completed inpatient rehabilitation at [facility] 11/25/21-12/07/21 and was discharged home with family. Patient known to this clinic from prior course of voice therapy in 2018. Pt with documented hx of  voice and speech changes (dysarthria) per videostroboscopy 12/26/17; did not attend therapy.      Treatment Provided   Treatment provided Cognitive-Linquistic      Pain Assessment   Pain Assessment No/denies pain      Cognitive-Linquistic Treatment   Treatment focused on Aphasia;Patient/family/caregiver education    Skilled Treatment Targeted auditory comprehension/requests for repeats for single and multistep commands. Single step 100% acc, progressed to 2-step 90% acc with occasional min cues to initiate repeats. With 3 step, accuracy decreased to 69%, with frequent cues to initiate repeats. Education provided on strategies to improve comprehension in conversation, and requesting repeats as necessary to ensure she comprehends listener's message. Comprehending voicemails initially 33% acc, improves to 100% acc with pt initiating repeats with mod I. Pt generated 4 items for simple categories with occasional min cues. Simple conversation with anomia occurring x3; mod cues to use description/semantic feature analysis. Hometasks provided.      Assessment / Recommendations / Plan   Plan Continue with current plan of care      Progression Toward Goals   Progression toward goals Progressing toward goals              SLP Education - 01/13/22 1554     Education Details request repeats; listener doesn't know when she doesn't comprehend unless she lets them know    Person(s) Educated Patient    Methods Explanation    Comprehension Verbalized understanding              SLP Short Term Goals - 12/16/21 1921       SLP SHORT TERM GOAL #1   Title Pt will communicate emergency information 100% accuracy using visual aid if necessary.    Time 6    Period Weeks    Status New    Target Date 01/27/22      SLP SHORT TERM GOAL #2   Title Pt will generate at least 4 descriptors of target word 80% of the time using semantic feature analysis to improve abilities in wordfinding and resolving  communication breakdowns.    Time 6    Period Weeks    Status New    Target Date 01/27/22      SLP SHORT TERM GOAL #3   Title Patient will request repetition/rephrasing to aid auditory comprehension of mod complex information (multi-step commands, conversation) in 80% of opportunities.    Time 6    Period Weeks    Status New    Target Date 01/27/22      SLP SHORT TERM GOAL #4   Title Pt will read and reply to simple texts/emails 80% accuracy with use of accessibility features as necessary.  Time 6    Period Weeks    Status New    Target Date 01/27/22              SLP Long Term Goals - 12/16/21 1926       SLP LONG TERM GOAL #1   Title Pt will engage in 5-8 minutes simple-mod complex conversation re: topic of interest with supported conversation, aphasia compensations.    Time 12    Period Weeks    Status New    Target Date 03/16/22      SLP LONG TERM GOAL #2   Title Pt will ID and attempt repair of communication breakdowns >80% of the time in session.    Time 12    Period Weeks    Status New    Target Date 03/16/22      SLP LONG TERM GOAL #3   Title Pt will demonstrate reading comprehension of mod complex 8-10 paragraphs by generating summary or answering questions >80% accuracy.    Time 12    Period Weeks    Status New              Plan - 01/13/22 1707     Clinical Impression Statement Pt continues with moderate aphasia with expressive>receptive deficits, including impairments in verbal expression, reading, writing, and to a lesser extent, auditory comprehension. Pt continues with wordfinding difficulty and hesitation, however verbal fluency and error correction improving in simple conversation. Patient requires encouragement to initiate repeats for comprehension of mulitstep commands. Patient expresses desire to improve communication with family and friends and to resume hobbies and interests such as reading and watching TV. I recommend skilled ST to  improve pt's communication skills to increase safety, independence and quality of life.    Speech Therapy Frequency 2x / week    Duration 2 weeks    Potential to Achieve Goals Good    Potential Considerations Family/community support    SLP Home Exercise Plan TalkPath Therapy sign-up and tasks provided    Consulted and Agree with Plan of Care Patient;Family member/caregiver             Patient will benefit from skilled therapeutic intervention in order to improve the following deficits and impairments:   Aphasia    Problem List Patient Active Problem List   Diagnosis Date Noted   Acute ischemic left MCA stroke ([place]) 11/16/2021   Middle cerebral artery embolism, 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   Status post reverse arthroplasty of shoulder, right 06/10/2021   Deneise Lever, MS, CCC-SLP Speech-Language Pathologist [phone]  Aliene Altes, [place] 01/13/2022, 5:07 PM  [place] MAIN [facility] SERVICES [address] [place], [place], [address] Phone: [phone]   Fax:  [phone]   Name: Cassidy Hernandez MRN: [id number] Date of Birth: 1940/08/18

## 2022-01-17 ENCOUNTER — Encounter: Payer: Medicare Other | Admitting: Occupational Therapy

## 2022-01-17 ENCOUNTER — Encounter: Payer: Medicare Other | Admitting: Speech Pathology

## 2022-01-17 ENCOUNTER — Ambulatory Visit: Payer: Medicare Other

## 2022-01-17 DIAGNOSIS — R2689 Other abnormalities of gait and mobility: Secondary | ICD-10-CM

## 2022-01-17 DIAGNOSIS — R4701 Aphasia: Secondary | ICD-10-CM | POA: Diagnosis not present

## 2022-01-17 DIAGNOSIS — M6281 Muscle weakness (generalized): Secondary | ICD-10-CM

## 2022-01-17 DIAGNOSIS — R278 Other lack of coordination: Secondary | ICD-10-CM

## 2022-01-17 DIAGNOSIS — R2681 Unsteadiness on feet: Secondary | ICD-10-CM

## 2022-01-17 DIAGNOSIS — R296 Repeated falls: Secondary | ICD-10-CM

## 2022-01-17 DIAGNOSIS — R269 Unspecified abnormalities of gait and mobility: Secondary | ICD-10-CM

## 2022-01-17 DIAGNOSIS — R262 Difficulty in walking, not elsewhere classified: Secondary | ICD-10-CM

## 2022-01-17 NOTE — Therapy (Signed)
[place] MAIN [facility] SERVICES [address] [place], [place], [address] Phone: [phone]   Fax:  [phone]  Physical Therapy Treatment  Patient Details  Name: Cassidy Hernandez MRN: [id number] Date of Birth: 09/28/1940 Referring Provider (PT): Cassidy Seashore, PA-C   Encounter Date: 01/17/2022   PT End of Session - 01/17/22 1354     Visit Number 8    Number of Visits 24    Date for PT Re-Evaluation 03/15/22    Authorization Type BCBS Medicare    Authorization Time Period 12/21/21-03/15/22    Progress Note Due on Visit 10    PT Start Time 6384    PT Stop Time 1429    PT Time Calculation (min) 42 min    Equipment Utilized During Treatment Gait belt    Activity Tolerance Patient tolerated treatment well;No increased pain    Behavior During Therapy [facility] for tasks assessed/performed             Past Medical History:  Diagnosis Date   Anginal pain ([place])    Aortic atherosclerosis    Cancer ([place])    Carotid artery stenosis 02/01/2010   a.) Doppler 53/64/6803: 21% LICA and 22% RICA. b.) Doppler 48/25/0037: >04% LICA and 88% RICA. c.) Doppler 10/25/16: >70% Bilater ICAs   Chronic bilateral low back pain with left-sided sciatica    CKD (chronic kidney disease), stage III ([facility])    Coronary artery disease    a.) LHC 12/24/2014 -> small LAD system; diffuse CTO of LAD. 60% RCA. LM and LCx with no sig disease. no intervention; med mgmt.   Degenerative arthritis    Diastolic dysfunction    a.) TTE 09/20/2018: EF 55%, G1DD, mild LAE and RVE, triv-mild panvalvular regurgitation.   Dysarthria    GERD (gastroesophageal reflux disease)    Hepatic steatosis    History of kidney stones    HLD (hyperlipidemia)    Hypertension    Kidney stone    Lumbar radiculopathy    Major depression in remission ([place])    PVD (peripheral vascular disease) ([place])    Renal cyst, right 06/02/2015   a.) CT 06/02/2015 --> 4.4 cm RIGHT renal mass; MRI recommended. b.) MRI  06/16/2015 --> 3.8 x 4.2 x 4.0 cm proteinaceous/hemmorhagic cyst.   Subclavian steal syndrome    a.) LHC 12/24/2014 --> tight eccentric 90% ostial stenosis with damping.   T2DM (type 2 diabetes mellitus) [facility])     Past Surgical History:  Procedure Laterality Date   ABDOMINAL HYSTERECTOMY     AUGMENTATION MAMMAPLASTY Bilateral 1980   BACK SURGERY  09/2018   BICEPT TENODESIS  06/10/2021   Procedure: BICEPS TENODESIS;  Surgeon: Cassidy Mull, MD;  Location: [place] [facility];  Service: Orthopedics;;   CARDIAC CATHETERIZATION Left 12/24/2014   Procedure: LEFT HEARD CATHETERIZATION; Location: [facility]; Surgeon: Cassidy Aly, MD   CHOLECYSTECTOMY     COLONOSCOPY N/A 02/11/2016   Procedure: COLONOSCOPY;  Surgeon: Cassidy Silvas, MD;  Location: [facility]COPY;  Service: Endoscopy;  Laterality: N/A;   IR CT HEAD LTD  11/16/2021   IR CT HEAD LTD  11/16/2021   IR INTRAVSC STENT CERV CAROTID W/O EMB-PROT MOD SED INC ANGIO  11/18/2021   IR PERCUTANEOUS ART THROMBECTOMY/INFUSION INTRACRANIAL INC DIAG ANGIO  11/16/2021   RADIOLOGY WITH ANESTHESIA N/A 11/16/2021   Procedure: IR WITH ANESTHESIA- CODE STROKE;  Surgeon: Radiologist, Medication, MD;  Location: [place];  Service: Radiology;  Laterality: N/A;   REVERSE SHOULDER ARTHROPLASTY Right 06/10/2021  Procedure: REVERSE SHOULDER ARTHROPLASTY;  Surgeon: Cassidy Mull, MD;  Location: [facility];  Service: Orthopedics;  Laterality: Right;    There were no vitals filed for this visit.   Subjective Assessment - 01/17/22 1351     Subjective Patient reports doing okay overall with no reported falls. States her blood pressure was up earlier today.    Pertinent History Pt Recent L MCA, s/p thrombectomy and ICA stenting. Pt with speech deficits. Pt has been somewhat unsteady with a few falls falling backward however DTR reports this is not new, but has been ongoing. Pt seen at our Sports clinic in 2018 fo rlow back pain issues in the setting of spintal stenosis.    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 No/denies             INTERVENTIONS:   BP= 148/65 mmHg Right UE in sitting at rest  Therapeutic Exercises:   -Stand forward step tap onto 1st step at stairs with BUE x 12 reps alt LE - Patient fatigued with mild difficulty raising right LE at end of reps.  -Stand side step tap onto 1st step at stairs with BUE x 12 reps alt LE   - Standing calf raises (feet on 1/2 foam roll) x 15 reps  - Standing hip abd BLE x 15 reps.   -Sit to stand with Minimal UE support (hands on knees) x 10 reps from straight back chair. Patient reported as medium.    Neuromuscular re-ed:   - static stand on 1/2 foam roll (curve side up) - Attempted several trials- 30 sec at max - more difficulty with right LE in rear.   - static stand on 1/2 foam roll (curve side down) - Much more challenging- Patient using mostly ankle righting strategy but some difficulty with posterior weight shift.   - staggered stand with eyes open x 30 sec hold x each LE x multiple attempts- Patient presents with minimal ability to advance LE in front. Increased unsteadiness in near tandem standing position.   Education provided throughout session via VC/TC and demonstration to facilitate movement at target joints and correct muscle activation for all testing and exercises performed. Rationale for Evaluation and Treatment Rehabilitation                              PT Education - 01/17/22 1354     Education provided Yes    Education Details exercise technique    Person(s) Educated Patient    Methods Explanation;Demonstration;Tactile cues;Verbal cues    Comprehension Verbalized  understanding;Returned demonstration;Verbal cues required;Tactile cues required;Need further instruction              PT Short Term Goals - 12/21/21 1508       PT SHORT TERM GOAL #1   Title Independence in HEP for improved balance and strength    Baseline Issued at eval    Time 3    Period Weeks    Status New    Target Date 01/11/22      PT SHORT TERM GOAL #2   Title 5xSTS hands free <19sec    Baseline Eval: >24sec hands free    Time 4  Period Weeks    Status New    Target Date 01/18/22      PT SHORT TERM GOAL #3   Title Ankle DF MMT 5/5 bilat.    Baseline Eval: 4+/5 Right, 4/5 Left    Time 4    Period Weeks    Status New    Target Date 01/18/22               PT Long Term Goals - 12/21/21 1509       PT LONG TERM GOAL #1   Title 6MWT > 1224f c LRAD    Baseline July 2019: 13234f   Time 8    Period Weeks    Status New    Target Date 02/15/22      PT LONG TERM GOAL #2   Title 5xSTS <13 sec hands free    Baseline eval: >24sec hands free    Time 8    Period Weeks    Status New    Target Date 02/15/22      PT LONG TERM GOAL #3   Title Pt to demonstrate retroAMB without device >0.2560ms LOB to demonstrate improved ankle righting and control.    Baseline Eval: <0.26m24mnd c 3 LOB    Time 10    Period Weeks    Status New    Target Date 03/01/22      PT LONG TERM GOAL #4   Title FOTO Score increase >15    Baseline eval: 48    Time 12    Period Weeks    Status New    Target Date 03/15/22                   Plan - 01/17/22 1354     Clinical Impression Statement Patient presents with good motivation for today's session. She was able to participate more in standing LE strengthening exercises today and later challenged with dynamic balance activities. She was limited by right LE weakness and fatigue today as well. She will benefit from continued progress with standing activities including strength and balance. ??PT will beneift from  continued skilled PT interventions to address impairments in order to maximize independence and safety in ADL/IADL and to reduce risk of falls.    Personal Factors and Comorbidities Past/Current Experience    Examination-Activity Limitations Dressing;Transfers;Bend;Locomotion Level;Stairs;Stand    Examination-Participation Restrictions Cleaning;Shop;Meal Prep;Yard[employer]alla [place];Laundry    Stability/Clinical Decision Making Evolving/Moderate complexity    Rehab Potential Good    Clinical Impairments Affecting Rehab Potential (+) motivation, social support (-) age, chronicity of pain, fairly sedentary lifestyle    PT Frequency 2x / week    PT Duration 12 weeks    PT Treatment/Interventions Aquatic Therapy;Moist Heat;Functional mobility training;Dry needling;Neuromuscular re-education;Traction;Ultrasound;Iontophoresis '4mg'$ /ml Dexamethasone;ADLs/Self Care Home Management;Cryotherapy;Electrical Stimulation;Therapeutic exercise;Therapeutic activities;Patient/family education;Taping;Gait training;Stair training;DME Instruction;Balance training;Passive range of motion    PT Next Visit Plan Progress safety with mobility and LE strengthening as appropriate.    PT Home Exercise Plan Eval: seated heel raises, toe raises, and STS from elevated surface. 01/04/2022=Access Code: [email or web address]: [email or web address]    Consulted and Agree with Plan of Care Patient;Family member/caregiver    Family Member Consulted [place]             Patient will benefit from skilled therapeutic intervention in order to improve the following deficits and impairments:  Decreased range of motion, Difficulty walking, Abnormal gait, Decreased balance, Decreased mobility, Decreased activity tolerance, Decreased knowledge of use of DME, Decreased strength,  Dizziness  Visit Diagnosis: Muscle weakness (generalized)  Other lack of coordination  Unsteadiness on feet  Abnormality of gait and mobility  Difficulty in  walking, not elsewhere classified  Other abnormalities of gait and mobility  Repeated falls     Problem List Patient Active Problem List   Diagnosis Date Noted   Acute ischemic left MCA stroke ([place]) 11/16/2021   Middle cerebral artery embolism, 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   Status post reverse arthroplasty of shoulder, right 06/10/2021    Lewis Moccasin, PT 01/18/2022, 8:01 AM  [place], [place], [address] Phone: [phone]   Fax:  [phone]  Name: Cassidy Hernandez MRN: [id number] Date of Birth: 05-26-1941

## 2022-01-18 ENCOUNTER — Ambulatory Visit: Payer: Medicare Other | Admitting: Speech Pathology

## 2022-01-18 ENCOUNTER — Ambulatory Visit: Payer: Medicare Other | Admitting: Occupational Therapy

## 2022-01-18 DIAGNOSIS — R278 Other lack of coordination: Secondary | ICD-10-CM

## 2022-01-18 DIAGNOSIS — R4701 Aphasia: Secondary | ICD-10-CM | POA: Diagnosis not present

## 2022-01-18 DIAGNOSIS — M6281 Muscle weakness (generalized): Secondary | ICD-10-CM

## 2022-01-18 DIAGNOSIS — R2681 Unsteadiness on feet: Secondary | ICD-10-CM

## 2022-01-18 DIAGNOSIS — I63512 Cerebral infarction due to unspecified occlusion or stenosis of left middle cerebral artery: Secondary | ICD-10-CM

## 2022-01-18 NOTE — Therapy (Signed)
[place] MAIN [facility] SERVICES [address] [place], [place], [address] Phone: [phone]   Fax:  [phone]  Speech Language Pathology Treatment  Patient Details  Name: Cassidy Hernandez MRN: [id number] Date of Birth: 07/15/1941 Referring Provider (SLP): Merrilee Seashore, NP   Encounter Date: 01/18/2022   End of Session - 01/18/22 1215     Visit Number 9    Number of Visits 25    Date for SLP Re-Evaluation 03/16/22    Authorization Type BC[employer] MCR; $10 copay    Authorization - Visit Number 9    Progress Note Due on Visit 10    SLP Start Time 1010    SLP Stop Time  1100    SLP Time Calculation (min) 50 min    Activity Tolerance Patient tolerated treatment well;No increased pain             Past Medical History:  Diagnosis Date   Anginal pain ([place])    Aortic atherosclerosis    Cancer ([place])    Carotid artery stenosis 02/01/2010   a.) Doppler 27/25/3664: 40% LICA and 34% RICA. b.) Doppler 74/25/9563: >87% LICA and 56% RICA. c.) Doppler 10/25/16: >70% Bilater ICAs   Chronic bilateral low back pain with left-sided sciatica    CKD (chronic kidney disease), stage III (HCC)    Coronary artery disease    a.) LHC 12/24/2014 -> small LAD system; diffuse CTO of LAD. 60% RCA. LM and LCx with no sig disease. no intervention; med mgmt.   Degenerative arthritis    Diastolic dysfunction    a.) TTE 09/20/2018: EF 55%, G1DD, mild LAE and RVE, triv-mild panvalvular regurgitation.   Dysarthria    GERD (gastroesophageal reflux disease)    Hepatic steatosis    History of kidney stones    HLD (hyperlipidemia)    Hypertension    Kidney stone    Lumbar radiculopathy    Major depression in remission ([place])    PVD (peripheral vascular disease) ([place])    Renal cyst, right 06/02/2015   a.) CT 06/02/2015 --> 4.4 cm RIGHT renal mass; MRI recommended. b.) MRI 06/16/2015 --> 3.8 x 4.2 x 4.0 cm proteinaceous/hemmorhagic cyst.   Subclavian steal syndrome    a.) LHC  12/24/2014 --> tight eccentric 90% ostial stenosis with damping.   T2DM (type 2 diabetes mellitus) [facility])     Past Surgical History:  Procedure Laterality Date   ABDOMINAL HYSTERECTOMY     AUGMENTATION MAMMAPLASTY Bilateral 1980   BACK SURGERY  09/2018   BICEPT TENODESIS  06/10/2021   Procedure: BICEPS TENODESIS;  Surgeon: Corky Mull, MD;  Location: [facility];  Service: Orthopedics;;   CARDIAC CATHETERIZATION Left 12/24/2014   Procedure: LEFT HEARD CATHETERIZATION; Location: [facility]; Surgeon: Laurence Aly, MD   CHOLECYSTECTOMY     COLONOSCOPY N/A 02/11/2016   Procedure: COLONOSCOPY;  Surgeon: Manya Silvas, MD;  Location: [facility] END[facility]COPY;  Service: Endoscopy;  Laterality: N/A;   IR CT HEAD LTD  11/16/2021   IR CT HEAD LTD  11/16/2021   IR INTRAVSC STENT CERV CAROTID W/O EMB-PROT MOD SED INC ANGIO  11/18/2021   IR PERCUTANEOUS ART THROMBECTOMY/INFUSION INTRACRANIAL INC DIAG ANGIO  11/16/2021   RADIOLOGY WITH ANESTHESIA N/A 11/16/2021   Procedure: IR WITH ANESTHESIA- CODE STROKE;  Surgeon: Radiologist, Medication, MD;  Location: [facility];  Service: Radiology;  Laterality: N/A;   REVERSE SHOULDER ARTHROPLASTY Right 06/10/2021   Procedure: REVERSE SHOULDER ARTHROPLASTY;  Surgeon: Corky Mull, MD;  Location: [facility];  Service: Orthopedics;  Laterality: Right;    There were no vitals filed for this visit.   Subjective Assessment - 01/18/22 1212     Subjective "my daughter flew in"    Patient is accompained by: --   Ernestine, caregiver   Currently in Pain? No/denies                   ADULT SLP TREATMENT - 01/18/22 0001       General Information   Behavior/Cognition Alert;Cooperative;Pleasant mood    HPI Pt is a 81 y.o. 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 Patient is s/p thrombectomy with left ICA stent placement 11/16/21. She completed inpatient rehabilitation at [facility] 11/25/21-12/07/21 and was discharged home with family. Patient known to this  clinic from prior course of voice therapy in 2018. Pt with documented hx of voice and speech changes (dysarthria) per videostroboscopy 12/26/17; did not attend therapy.      Treatment Provided   Treatment provided Cognitive-Linquistic      Pain Assessment   Pain Assessment No/denies pain      Cognitive-Linquistic Treatment   Treatment focused on Aphasia;Patient/family/caregiver education     Skilled Treatment Pt seen for skilled ST intervention targeting goals below. Pt returned homework activity given last session with no blanks and minimal errors. Pt reported couldn't remember the problems that gave her trouble.   Pt engaged with clinicians in 10 min conversation using visual aids (pictures) and semantic feature analysis with max to mod cues to aid recall. Graduate clinician targeted identifying category descriptor through Constant Therapy (CT). Pt completed activity with min cues with 100% accuracy and 82% accuracy (increased level of complexity.) Targeted auditory comprehension/request in recalling details from verbal stories and voicemails in CT.  Pt replayed stories and voicemails <10 times with mod to min cues and completed questions with 87% to 100% accuracy. Gradute clinician prompted Pt to take notes of details, Pt referenced note 4x. Plan for next time to target written language with "Thank You" cards.      Assessment / Recommendations / Plan   Plan Continue with current plan of care      Progression Toward Goals   Progression toward goals Progressing toward goals              SLP Education - 01/18/22 1213     Education Details Encouraged self-advocacy, requesting clarification and repetition, and informing listeners of her difficulty communicating    Person(s) Educated Patient;Caregiver(s)    Methods Explanation    Comprehension Verbalized understanding              SLP Short Term Goals - 12/16/21 1921       SLP SHORT TERM GOAL #1   Title Pt will communicate  emergency information 100% accuracy using visual aid if necessary.    Time 6    Period Weeks    Status New    Target Date 01/27/22      SLP SHORT TERM GOAL #2   Title Pt will generate at least 4 descriptors of target word 80% of the time using semantic feature analysis to improve abilities in wordfinding and resolving communication breakdowns.    Time 6    Period Weeks    Status New    Target Date 01/27/22      SLP SHORT TERM GOAL #3   Title Patient will request repetition/rephrasing to aid auditory comprehension of mod complex information (multi-step commands, conversation) in 80%  of opportunities.    Time 6    Period Weeks    Status New    Target Date 01/27/22      SLP SHORT TERM GOAL #4   Title Pt will read and reply to simple texts/emails 80% accuracy with use of accessibility features as necessary.    Time 6    Period Weeks    Status New    Target Date 01/27/22              SLP Long Term Goals - 12/16/21 1926       SLP LONG TERM GOAL #1   Title Pt will engage in 5-8 minutes simple-mod complex conversation re: topic of interest with supported conversation, aphasia compensations.    Time 12    Period Weeks    Status New    Target Date 03/16/22      SLP LONG TERM GOAL #2   Title Pt will ID and attempt repair of communication breakdowns >80% of the time in session.    Time 12    Period Weeks    Status New    Target Date 03/16/22      SLP LONG TERM GOAL #3   Title Pt will demonstrate reading comprehension of mod complex 8-10 paragraphs by generating summary or answering questions >80% accuracy.    Time 12    Period Weeks    Status New              Plan - 01/18/22 1216     Clinical Impression Statement Pt continues to present with moderate aphasia, with expressive deficits greater than receptive deficits. Pt exhibits hesitant speech during conversation, however, she is able to communicate effectively with min verbal or visual cues. Frustration is  minimal, and pt was encouraged to inform listeners of her difficulty with verbal communication. She requires encouragement to request repetition of information. Pt would benefit from continued skilled ST intervention per plan of care.    Speech Therapy Frequency 2x / week    Duration 2 weeks    Treatment/Interventions SLP instruction and feedback;Multimodal communcation approach;Patient/family education;Functional tasks;Cueing hierarchy;Compensatory techniques;Compensatory strategies    Potential to Achieve Goals Good    Potential Considerations Family/community support    SLP Home Exercise Plan Talk[employer] Therapy sign-up and tasks provided last session. Pt did not bring iPad today    Consulted and Agree with Plan of Care Patient;Family member/caregiver   Ernestine, caregiver            Patient will benefit from skilled therapeutic intervention in order to improve the following deficits and impairments:   Acute ischemic left MCA stroke [facility])    Problem List Patient Active Problem List   Diagnosis Date Noted   Acute ischemic left MCA stroke ([place]) 11/16/2021   Middle cerebral artery embolism, 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   Status post reverse arthroplasty of shoulder, right 06/10/2021   Soua Lenk B. Quentin Ore, [facility], CCC-SLP Speech Language Pathologist  Shonna Chock, [place] 01/18/2022, 12:43 PM  [place] MAIN [facility] SERVICES [address] [place], [place], [address] Phone: [phone]   Fax:  [phone]   Name: Cassidy Hernandez MRN: [id number] Date of Birth: [birth year]

## 2022-01-18 NOTE — Therapy (Signed)
[place] MAIN [facility] SERVICES [address] [place], [place], [address] Phone: [phone]   Fax:  [phone]  Occupational Therapy Treatment  Patient Details  Name: Cassidy Hernandez MRN: [id number] Date of Birth: 09-Jul-1941 Referring Provider (OT): Dr. Frazier Richards   Encounter Date: 01/18/2022   OT End of Session - 01/18/22 1616     Visit Number 7    Number of Visits 24    Date for OT Re-Evaluation 03/13/22    OT Start Time 1100    OT Stop Time 1146    OT Time Calculation (min) 46 min    Equipment Utilized During Treatment 4 wheeled walker    Activity Tolerance Patient tolerated treatment well    Behavior During Therapy [facility] for tasks assessed/performed             Past Medical History:  Diagnosis Date   Anginal pain ([place])    Aortic atherosclerosis    Cancer ([place])    Carotid artery stenosis 02/01/2010   a.) Doppler 34/74/2595: 63% LICA and 87% RICA. b.) Doppler 56/43/3295: >18% LICA and 84% RICA. c.) Doppler 10/25/16: >70% Bilater ICAs   Chronic bilateral low back pain with left-sided sciatica    CKD (chronic kidney disease), stage III (HCC)    Coronary artery disease    a.) LHC 12/24/2014 -> small LAD system; diffuse CTO of LAD. 60% RCA. LM and LCx with no sig disease. no intervention; med mgmt.   Degenerative arthritis    Diastolic dysfunction    a.) TTE 09/20/2018: EF 55%, G1DD, mild LAE and RVE, triv-mild panvalvular regurgitation.   Dysarthria    GERD (gastroesophageal reflux disease)    Hepatic steatosis    History of kidney stones    HLD (hyperlipidemia)    Hypertension    Kidney stone    Lumbar radiculopathy    Major depression in remission ([place])    PVD (peripheral vascular disease) ([place])    Renal cyst, right 06/02/2015   a.) CT 06/02/2015 --> 4.4 cm RIGHT renal mass; MRI recommended. b.) MRI 06/16/2015 --> 3.8 x 4.2 x 4.0 cm proteinaceous/hemmorhagic cyst.   Subclavian steal syndrome    a.) LHC 12/24/2014 -->  tight eccentric 90% ostial stenosis with damping.   T2DM (type 2 diabetes mellitus) [facility])     Past Surgical History:  Procedure Laterality Date   ABDOMINAL HYSTERECTOMY     AUGMENTATION MAMMAPLASTY Bilateral 1980   BACK SURGERY  09/2018   BICEPT TENODESIS  06/10/2021   Procedure: BICEPS TENODESIS;  Surgeon: Corky Mull, MD;  Location: [facility];  Service: Orthopedics;;   CARDIAC CATHETERIZATION Left 12/24/2014   Procedure: LEFT HEARD CATHETERIZATION; Location: [facility]; Surgeon: Laurence Aly, MD   CHOLECYSTECTOMY     COLONOSCOPY N/A 02/11/2016   Procedure: COLONOSCOPY;  Surgeon: Manya Silvas, MD;  Location: [facility];  Service: Endoscopy;  Laterality: N/A;   IR CT HEAD LTD  11/16/2021   IR CT HEAD LTD  11/16/2021   IR INTRAVSC STENT CERV CAROTID W/O EMB-PROT MOD SED INC ANGIO  11/18/2021   IR PERCUTANEOUS ART THROMBECTOMY/INFUSION INTRACRANIAL INC DIAG ANGIO  11/16/2021   RADIOLOGY WITH ANESTHESIA N/A 11/16/2021   Procedure: IR WITH ANESTHESIA- CODE STROKE;  Surgeon: Radiologist, Medication, MD;  Location: [place];  Service: Radiology;  Laterality: N/A;   REVERSE SHOULDER ARTHROPLASTY Right 06/10/2021   Procedure: REVERSE SHOULDER ARTHROPLASTY;  Surgeon: Corky Mull, MD;  Location: [facility];  Service: Orthopedics;  Laterality: Right;  There were no vitals filed for this visit.   Subjective Assessment - 01/18/22 1613     Subjective  Pt reports she is doing well but having some leg pain.  Has an aide with her today.    Pertinent History Recent L MCA, s/p thrombectomy and ICA stenting    Limitations aphasia, balance deficits, generalized weakness, hx of falls.    Patient Stated Goals Return to being indep and walk without AD    Currently in Pain? Yes    Pain Score 4     Pain Location Leg    Pain Orientation Right    Pain Descriptors / Indicators Aching    Pain Type Chronic pain    Pain Onset More than a month ago    Pain Frequency Intermittent             Rationale for  Evaluation and Treatment Rehabilitation  Therapeutic Exercises: Pt seen for focus on strengthening with bilateral Grip strength on 3rd setting, [place], 17.9#, performed with each UE, more difficulty with left than right.  Cues for hand placement on gripper as well as technique to squeeze and pick up objects with gripper to move from board to bucket.  Performed this date from seated position.   Right and left hand strengthening with 1# dumbbell weight for wrist flexion/extension, supination/pronation and radial and ulnar deviation.  Therapist demo and cues for proper form and technique.  10 reps each, 2 sets.    Neuromuscular reeducation:   Pt seen for focus on tasks to promote improve coordination while also working on standing balance and activity tolerance this date.  Manipulation of [place] discs to pick up, turn and flip utilizing isolated finger movements, cues for isolation of index finger for turning object.  Several rounds performed unilateral, bilateral and simultaneously while in standing positions.   Sit to stand with and without use of arms, min guard with use of arms on arm rest, min assist for sit to stand without use of arms, cues for positioning in chair and to prepare for sit to stand transitional movements.   Manipulation of small Scrabble tiles in standing to form letters to denotes months of the year, completed 3 before sitting.  Pt performing well with stating months, names and spelling out each with letters.  Advanced to formulating Names, completed 6 while standing, min guard.   Response to tx:  Pt continues to progress well, limited by some pain in right lower extremity this date but able to tolerate exercises in standing for short periods of time.  Pt attempting sit to stand transfers, cues for positioning of self to prepare for transfer, weight shift and with and without use of arms.  Increased difficulty with sit to stand without use of arms.  Strength continues to  improve and will continue with acts in standing to challenge balance and improve activity tolerance to standing acts.  Continue to work towards goals in plan of care to maximize safety and independence in ADL and IADL tasks.                         OT Education - 01/18/22 1616     Education Details strengthening, balance for tasks in standing, coordination    Person(s) Educated Patient    Methods Explanation;Verbal cues;Demonstration    Comprehension Verbalized understanding;Verbal cues required;Need further instruction;Returned demonstration              OT Short Term Goals -  12/21/21 0946       OT SHORT TERM GOAL #1   Title Pt will be indep with theraputty HEP for increasing bilat grip/pinch strength.    Baseline Eval: not yet initiated    Time 6    Period Weeks    Status New    Target Date 01/30/22      OT SHORT TERM GOAL #2   Title Pt will be indep to verbalize 3 fall prevention strategies for safe ADL completion.    Baseline Eval: not yet initiated (hx of 4 falls in the last 6 months)    Time 6    Period Weeks    Status New    Target Date 01/30/22               OT Long Term Goals - 12/21/21 0947       OT LONG TERM GOAL #1   Title Pt will increase FOTO score to 45 or better.    Baseline Eval: FOTO 32 (OT estimates this # should be higher based on objective testing; likely inaccurate answer selections d/t aphasia)    Time 12    Period Weeks    Status New    Target Date 03/13/22      OT LONG TERM GOAL #2   Title Pt will increase bilat grip strength by 5 or more lbs for improved ability to hold and carry ADL supplies.    Baseline Eval: R 23, L 25    Time 12    Period Weeks    Status New    Target Date 03/13/22      OT LONG TERM GOAL #3   Title Pt will perform tub transfer with modified indep.    Baseline Eval: min-mod A (FOF)    Time 12    Period Weeks    Status New    Target Date 03/13/22      OT LONG TERM GOAL #4   Title Pt  will manage hot/cold meal prep with modified indep    Baseline Eval: family currently managing meals    Time 12    Period Weeks    Status New    Target Date 03/13/22      OT LONG TERM GOAL #5   Title Pt will manage medications with modified indep    Baseline Eval: Pt and daughter manage together since CVA    Time 29    Period Weeks    Status New    Target Date 03/13/22      Long Term Additional Goals   Additional Long Term Goals Yes      OT LONG TERM GOAL #6   Title Pt will safely reach in all directions and planes with unilateral to no UE support in order to perform ADL/IADLs with LRD.    Baseline Eval: Pt using rollator and has not yet attempted IADL tasks (family managing IADLs)    Time 12    Period Weeks    Status New    Target Date 03/13/22                   Plan - 01/18/22 1617     Clinical Impression Statement Pt continues to progress well, limited by some pain in right lower extremity this date but able to tolerate exercises in standing for short periods of time.  Pt attempting sit to stand transfers, cues for positioning of self to prepare for transfer, weight shift and with and without use of  arms.  Increased difficulty with sit to stand without use of arms.  Strength continues to improve and will continue with acts in standing to challenge balance and improve activity tolerance to standing acts.  Continue to work towards goals in plan of care to maximize safety and independence in ADL and IADL tasks.    OT Occupational Profile and History Problem Focused Assessment - Including review of records relating to presenting problem    Occupational performance deficits (Please refer to evaluation for details): ADL's;IADL's;Leisure;Social Participation    Body Structure / Function / Physical Skills ADL;Coordination;Endurance;GMC;UE functional use;Balance;Decreased knowledge of precautions;IADL;Strength;Dexterity;FMC;Gait;Mobility    Cognitive Skills Understand    Rehab  Potential Good    Clinical Decision Making Several treatment options, min-mod task modification necessary    Comorbidities Affecting Occupational Performance: None    Modification or Assistance to Complete Evaluation  Min-Moderate modification of tasks or assist with assess necessary to complete eval   extra time to process commands and formulate answers to questions d/t aphasia   OT Frequency 2x / week    OT Duration 12 weeks    OT Treatment/Interventions Self-care/ADL training;Therapeutic exercise;DME and/or AE instruction;Functional Mobility Training;Balance training;Neuromuscular education;Therapeutic activities;Patient/family education;Coping strategies training    Plan OT focus on UB strengthening, dynamic standing balance, IADLs, med management    OT Home Exercise Plan not yet initiated    Consulted and Agree with Plan of Care Patient;Family member/caregiver             Patient will benefit from skilled therapeutic intervention in order to improve the following deficits and impairments:   Body Structure / Function / Physical Skills: ADL, Coordination, Endurance, GMC, UE functional use, Balance, Decreased knowledge of precautions, IADL, Strength, Dexterity, FMC, Gait, Mobility Cognitive Skills: Understand     Visit Diagnosis: Muscle weakness (generalized)  Other lack of coordination  Unsteadiness on feet    Problem List Patient Active Problem List   Diagnosis Date Noted   Acute ischemic left MCA stroke ([place]) 11/16/2021   Middle cerebral artery embolism, 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

## 2022-01-19 ENCOUNTER — Ambulatory Visit: Payer: Medicare Other | Admitting: Occupational Therapy

## 2022-01-19 ENCOUNTER — Ambulatory Visit: Payer: Medicare Other | Admitting: Speech Pathology

## 2022-01-19 ENCOUNTER — Ambulatory Visit: Payer: Medicare Other | Admitting: Physical Therapy

## 2022-01-19 DIAGNOSIS — R278 Other lack of coordination: Secondary | ICD-10-CM

## 2022-01-19 DIAGNOSIS — R4701 Aphasia: Secondary | ICD-10-CM | POA: Diagnosis not present

## 2022-01-19 DIAGNOSIS — R471 Dysarthria and anarthria: Secondary | ICD-10-CM

## 2022-01-19 DIAGNOSIS — M6281 Muscle weakness (generalized): Secondary | ICD-10-CM

## 2022-01-19 NOTE — Therapy (Signed)
[employer] MAIN [facility] SERVICES [address] [place], [place], [address] Phone: [phone]   Fax:  [phone]  Speech Language Pathology Treatment and Progress Note  Patient Details  Name: Cassidy Hernandez MRN: [id number] Date of Birth: 1940-11-02 Referring Provider (SLP): Merrilee Seashore, NP  Speech Therapy Progress Note  Dates of Reporting Period: 12/16/21 to 01/19/22  Objective: Patient has been seen for 10 speech therapy sessions this reporting period targeting aphasia. Patient is making progress toward STGs and LTGs this reporting period. STGs were not formally assessed today due to session being cut short due to pt chest pain; will reassess goals next session. See skilled intervention, clinical impressions, and goals below for details.   Encounter Date: 01/19/2022   End of Session - 01/19/22 1118     Visit Number 10    Number of Visits 25    Date for SLP Re-Evaluation 03/16/22    Authorization Type [employer] MCR; $10 copay    Authorization - Visit Number 10    Progress Note Due on Visit 10    SLP Start Time 1102    SLP Stop Time  1200    SLP Time Calculation (min) 58 min    Activity Tolerance Patient tolerated treatment well             Past Medical History:  Diagnosis Date   Anginal pain ([place])    Aortic atherosclerosis    Cancer ([place])    Carotid artery stenosis 02/01/2010   a.) Doppler 03/21/4817: 56% LICA and 31% RICA. b.) Doppler 49/70/2637: >85% LICA and 88% RICA. c.) Doppler 10/25/16: >70% Bilater ICAs   Chronic bilateral low back pain with left-sided sciatica    CKD (chronic kidney disease), stage III (HCC)    Coronary artery disease    a.) LHC 12/24/2014 -> small LAD system; diffuse CTO of LAD. 60% RCA. LM and LCx with no sig disease. no intervention; med mgmt.   Degenerative arthritis    Diastolic dysfunction    a.) TTE 09/20/2018: EF 55%, G1DD, mild LAE and RVE, triv-mild panvalvular regurgitation.   Dysarthria    GERD  (gastroesophageal reflux disease)    Hepatic steatosis    History of kidney stones    HLD (hyperlipidemia)    Hypertension    Kidney stone    Lumbar radiculopathy    Major depression in remission ([place])    PVD (peripheral vascular disease) ([place])    Renal cyst, right 06/02/2015   a.) CT 06/02/2015 --> 4.4 cm RIGHT renal mass; MRI recommended. b.) MRI 06/16/2015 --> 3.8 x 4.2 x 4.0 cm proteinaceous/hemmorhagic cyst.   Subclavian steal syndrome    a.) LHC 12/24/2014 --> tight eccentric 90% ostial stenosis with damping.   T2DM (type 2 diabetes mellitus) [facility])     Past Surgical History:  Procedure Laterality Date   ABDOMINAL HYSTERECTOMY     AUGMENTATION MAMMAPLASTY Bilateral 1980   BACK SURGERY  09/2018   BICEPT TENODESIS  06/10/2021   Procedure: BICEPS TENODESIS;  Surgeon: Corky Mull, MD;  Location: [facility];  Service: Orthopedics;;   CARDIAC CATHETERIZATION Left 12/24/2014   Procedure: LEFT HEARD CATHETERIZATION; Location: [facility]; Surgeon: Laurence Aly, MD   CHOLECYSTECTOMY     COLONOSCOPY N/A 02/11/2016   Procedure: COLONOSCOPY;  Surgeon: Manya Silvas, MD;  Location: [facility] END[facility]COPY;  Service: Endoscopy;  Laterality: N/A;   IR CT HEAD LTD  11/16/2021   IR CT HEAD LTD  11/16/2021   IR INTRAVSC STENT CERV CAROTID  W/O EMB-PROT MOD SED INC ANGIO  11/18/2021   IR PERCUTANEOUS ART THROMBECTOMY/INFUSION INTRACRANIAL INC DIAG ANGIO  11/16/2021   RADIOLOGY WITH ANESTHESIA N/A 11/16/2021   Procedure: IR WITH ANESTHESIA- CODE STROKE;  Surgeon: Radiologist, Medication, MD;  Location: [place];  Service: Radiology;  Laterality: N/A;   REVERSE SHOULDER ARTHROPLASTY Right 06/10/2021   Procedure: REVERSE SHOULDER ARTHROPLASTY;  Surgeon: Corky Mull, MD;  Location: [facility];  Service: Orthopedics;  Laterality: Right;    There were no vitals filed for this visit.   Subjective Assessment - 01/19/22 1113     Subjective "Let me show you my granddaughter."    Currently in Pain? No/denies                    ADULT SLP TREATMENT - 01/19/22 1116       General Information   Behavior/Cognition Alert;Cooperative;Pleasant mood    HPI Pt is a 81 y.o. 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 Patient is s/p thrombectomy with left ICA stent placement 11/16/21. She completed inpatient rehabilitation at [facility] 11/25/21-12/07/21 and was discharged home with family. Patient known to this clinic from prior course of voice therapy in 2018. Pt with documented hx of voice and speech changes (dysarthria) per videostroboscopy 12/26/17; did not attend therapy.      Treatment Provided   Treatment provided Cognitive-Linquistic      Pain Assessment   Pain Assessment No/denies pain      Cognitive-Linquistic Treatment   Treatment focused on Aphasia;Patient/family/caregiver education    Skilled Treatment Targeted sentence formulation and verbal expression using VNEST. Patient generated SVO sentences in simple form (min spelling errors) with min cues. When adding complexity (who/when/where), usual mod-max cues required. Pt did not create list of thank-you notes to write, but wanted to attempt writing a note to her friend. Graduate clinician and SLP provided frequent mod-max cues. Partway through session, patient reported onset of chest pain and took nitroglycerin from her purse. Stopped session early due to pt symptoms: chest pain, lightheadedness and took vitals. Chest pain subsided, however blood pressure was 68/43, then 80/60 with subsequent reading. After several minutes, blood pressure increased to 143/90. Recommended pt go to the ED. Patient preferred to contact her son-in-law (cardiac surgeon). She deferred going to ED but was encouraged by SLP, outpatient rehab supervisor, and her son-in-law to contact her physician and go to the ED if symptoms returned.      Assessment / Recommendations / Plan   Plan Continue with current plan of care      Progression Toward Goals   Progression  toward goals Progressing toward goals              SLP Education - 01/19/22 1515     Education Details advise going to ED for chest pain, changes in blood pressure    Person(s) Educated Patient;Other (comment)   sister Rod Holler   Methods Explanation    Comprehension Verbalized understanding              SLP Short Term Goals - 01/19/22 1124       SLP SHORT TERM GOAL #1   Title Pt will communicate emergency information 100% accuracy using visual aid if necessary.    Time 6    Period Weeks    Status On-going   Will reassess next visit.   Target Date 01/27/22      SLP SHORT TERM GOAL #2   Title Pt will generate  at least 4 descriptors of target word 80% of the time using semantic feature analysis to improve abilities in wordfinding and resolving communication breakdowns.    Time 6    Period Weeks    Status On-going   Will reassess next visit.   Target Date 01/27/22      SLP SHORT TERM GOAL #3   Title Patient will request repetition/rephrasing to aid auditory comprehension of mod complex information (multi-step commands, conversation) in 80% of opportunities.    Time 6    Period Weeks    Status On-going   Will reassess next visit.   Target Date 01/27/22      SLP SHORT TERM GOAL #4   Title Pt will read and reply to simple texts/emails 80% accuracy with use of accessibility features as necessary.    Time 6    Period Weeks    Status On-going   Will reassess next visit.   Target Date 01/27/22              SLP Long Term Goals - 01/19/22 1525       SLP LONG TERM GOAL #1   Title Pt will engage in 5-8 minutes simple-mod complex conversation re: topic of interest with supported conversation, aphasia compensations.    Time 12    Period Weeks    Status On-going      SLP LONG TERM GOAL #2   Title Pt will ID and attempt repair of communication breakdowns >80% of the time in session.    Time 12    Period Weeks    Status On-going      SLP LONG TERM GOAL #3   Title Pt  will demonstrate reading comprehension of mod complex 8-10 paragraphs by generating summary or answering questions >80% accuracy.    Time 12    Period Weeks    Status On-going              Plan - 01/19/22 1118     Clinical Impression Statement Pt continues to present with moderate aphasia, with expressive deficits greater than receptive deficits. She is demonstrating improved confidence as well as fluidity in simple conversation, and continues to improve frustration tolerance. Patient is progressing toward STGs and LTGs. Goals partially reassessed today, however patient had episode of chest pain and drop in blood pressure which limited ability to complete assessments; will reassess and update next session. Pt would benefit from continued skilled ST intervention per plan of care.    Speech Therapy Frequency 2x / week    Duration 2 weeks    Treatment/Interventions SLP instruction and feedback;Multimodal communcation approach;Patient/family education;Functional tasks;Cueing hierarchy;Compensatory techniques;Compensatory strategies    Potential to Achieve Goals Good    Potential Considerations Family/community support    SLP Home Exercise Plan Talk[employer] Therapy sign-up and tasks provided last session. 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

## 2022-01-20 ENCOUNTER — Ambulatory Visit: Payer: Medicare Other | Admitting: Physical Therapy

## 2022-01-20 NOTE — Therapy (Addendum)
[place] MAIN [facility] SERVICES [address] [place], [place], [address] Phone: [phone]   Fax:  [phone]  Occupational Therapy Cancellation  Patient Details  Name: Cassidy Hernandez MRN: [id number] Date of Birth: 19-Feb-1941 Referring Provider (OT): Dr. Frazier Richards   Encounter Date: 01/19/2022   OT End of Session - 01/20/22 1220     Visit Number 7    Number of Visits 24    Date for OT Re-Evaluation 03/13/22    Equipment Utilized During Treatment 4 wheeled walker    Activity Tolerance Patient tolerated treatment well    Behavior During Therapy [facility] for tasks assessed/performed             Past Medical History:  Diagnosis Date   Anginal pain ([place])    Aortic atherosclerosis    Cancer ([place])    Carotid artery stenosis 02/01/2010   a.) Doppler 02/72/5366: 44% LICA and 03% RICA. b.) Doppler 47/42/5956: >38% LICA and 75% RICA. c.) Doppler 10/25/16: >70% Bilater ICAs   Chronic bilateral low back pain with left-sided sciatica    CKD (chronic kidney disease), stage III ([place])    Coronary artery disease    a.) LHC 12/24/2014 -> small LAD system; diffuse CTO of LAD. 60% RCA. LM and LCx with no sig disease. no intervention; med mgmt.   Degenerative arthritis    Diastolic dysfunction    a.) TTE 09/20/2018: EF 55%, G1DD, mild LAE and RVE, triv-mild panvalvular regurgitation.   Dysarthria    GERD (gastroesophageal reflux disease)    Hepatic steatosis    History of kidney stones    HLD (hyperlipidemia)    Hypertension    Kidney stone    Lumbar radiculopathy    Major depression in remission ([place])    PVD (peripheral vascular disease) ([place])    Renal cyst, right 06/02/2015   a.) CT 06/02/2015 --> 4.4 cm RIGHT renal mass; MRI recommended. b.) MRI 06/16/2015 --> 3.8 x 4.2 x 4.0 cm proteinaceous/hemmorhagic cyst.   Subclavian steal syndrome    a.) LHC 12/24/2014 --> tight eccentric 90% ostial stenosis with damping.   T2DM (type 2 diabetes  mellitus) [facility])     Past Surgical History:  Procedure Laterality Date   ABDOMINAL HYSTERECTOMY     AUGMENTATION MAMMAPLASTY Bilateral 1980   BACK SURGERY  09/2018   BICEPT TENODESIS  06/10/2021   Procedure: BICEPS TENODESIS;  Surgeon: Corky Mull, MD;  Location: [place] [facility];  Service: Orthopedics;;   CARDIAC CATHETERIZATION Left 12/24/2014   Procedure: LEFT HEARD CATHETERIZATION; Location: [facility]; Surgeon: Laurence Aly, MD   CHOLECYSTECTOMY     COLONOSCOPY N/A 02/11/2016   Procedure: COLONOSCOPY;  Surgeon: Manya Silvas, MD;  Location: [facility] ENDOSCOPY;  Service: Endoscopy;  Laterality: N/A;   IR CT HEAD LTD  11/16/2021   IR CT HEAD LTD  11/16/2021   IR INTRAVSC STENT CERV CAROTID W/O EMB-PROT MOD SED INC ANGIO  11/18/2021   IR PERCUTANEOUS ART THROMBECTOMY/INFUSION INTRACRANIAL INC DIAG ANGIO  11/16/2021   RADIOLOGY WITH ANESTHESIA N/A 11/16/2021   Procedure: IR WITH ANESTHESIA- CODE STROKE;  Surgeon: Radiologist, Medication, MD;  Location: [place];  Service: Radiology;  Laterality: N/A;   REVERSE SHOULDER ARTHROPLASTY Right 06/10/2021   Procedure: REVERSE SHOULDER ARTHROPLASTY;  Surgeon: Corky Mull, MD;  Location: [facility];  Service: Orthopedics;  Laterality: Right;    There were no vitals filed for this visit.   Subjective Assessment - 01/20/22 1219     Subjective  Pt unable  to stay for OT session this date.  In SLP, she reported she "didn't feel right" and had some chest pain.    Pertinent History Recent L MCA, s/p thrombectomy and ICA stenting    Limitations aphasia, balance deficits, generalized weakness, hx of falls.    Patient Stated Goals Return to being indep and walk without AD    Currently in Pain? No/denies    Pain Score 0-No pain    Pain Onset More than a month ago             Pt reported chest pains in SLP and reports she took one nitroglycerin tablet as directed by her doctor, did not feel relief so she took another 5 mins later.  She did have relief from the chest  pain but when the SLP took her BP is was 68/43 via dynamap.  OT arrived and took BP manually 80/60.  Recommended she go to the ER for evaluation.  Pt reports she did not want to sit in the ER for an extended period of time.  BP taken again and was 143/90. She called her daughter and son in law.  Son in law is a cardiac specialist at [facility], he reports it is likely the nitro lowered her BP but if she has any more episodes of chest pain go to the ER.  Pt left facility with her sister.   Not seen for OT treatment this date.                      OT Education - 01/20/22 1220     Person(s) Educated Patient    Methods Explanation;Verbal cues;Demonstration    Comprehension Verbalized understanding;Verbal cues required;Need further instruction;Returned demonstration              OT Short Term Goals - 12/21/21 0946       OT SHORT TERM GOAL #1   Title Pt will be indep with theraputty HEP for increasing bilat grip/pinch strength.    Baseline Eval: not yet initiated    Time 6    Period Weeks    Status New    Target Date 01/30/22      OT SHORT TERM GOAL #2   Title Pt will be indep to verbalize 3 fall prevention strategies for safe ADL completion.    Baseline Eval: not yet initiated (hx of 4 falls in the last 6 months)    Time 6    Period Weeks    Status New    Target Date 01/30/22               OT Long Term Goals - 12/21/21 0947       OT LONG TERM GOAL #1   Title Pt will increase FOTO score to 45 or better.    Baseline Eval: FOTO 32 (OT estimates this # should be higher based on objective testing; likely inaccurate answer selections d/t aphasia)    Time 12    Period Weeks    Status New    Target Date 03/13/22      OT LONG TERM GOAL #2   Title Pt will increase bilat grip strength by 5 or more lbs for improved ability to hold and carry ADL supplies.    Baseline Eval: R 23, L 25    Time 12    Period Weeks    Status New    Target Date 03/13/22      OT LONG  TERM GOAL #3  Title Pt will perform tub transfer with modified indep.    Baseline Eval: min-mod A (FOF)    Time 12    Period Weeks    Status New    Target Date 03/13/22      OT LONG TERM GOAL #4   Title Pt will manage hot/cold meal prep with modified indep    Baseline Eval: family currently managing meals    Time 12    Period Weeks    Status New    Target Date 03/13/22      OT LONG TERM GOAL #5   Title Pt will manage medications with modified indep    Baseline Eval: Pt and daughter manage together since CVA    Time 45    Period Weeks    Status New    Target Date 03/13/22      Long Term Additional Goals   Additional Long Term Goals Yes      OT LONG TERM GOAL #6   Title Pt will safely reach in all directions and planes with unilateral to no UE support in order to perform ADL/IADLs with LRD.    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

## 2022-01-24 ENCOUNTER — Ambulatory Visit: Payer: Medicare Other | Admitting: Occupational Therapy

## 2022-01-24 ENCOUNTER — Ambulatory Visit: Payer: Medicare Other | Admitting: Speech Pathology

## 2022-01-24 DIAGNOSIS — R2681 Unsteadiness on feet: Secondary | ICD-10-CM

## 2022-01-24 DIAGNOSIS — R4701 Aphasia: Secondary | ICD-10-CM | POA: Diagnosis not present

## 2022-01-24 DIAGNOSIS — M6281 Muscle weakness (generalized): Secondary | ICD-10-CM

## 2022-01-24 DIAGNOSIS — R471 Dysarthria and anarthria: Secondary | ICD-10-CM

## 2022-01-24 DIAGNOSIS — R278 Other lack of coordination: Secondary | ICD-10-CM

## 2022-01-24 NOTE — Therapy (Addendum)
[facility] MAIN [facility] SERVICES [address] [place], [name], [address] Phone: [phone]   Fax:  [phone]  Speech Language Pathology Treatment  Patient Details  Name: Cassidy Hernandez MRN: [id number] Date of Birth: 30-Jun-1941 Referring Provider (SLP): Marita Kansas, NP   Encounter Date: 01/24/2022   End of Session - 01/24/22 1438     Visit Number 11    Number of Visits 25    Date for SLP Re-Evaluation 03/16/22    Authorization Type [employer] MCR; $10 copay    Authorization - Visit Number 1    Progress Note Due on Visit 10    SLP Start Time 1100    SLP Stop Time  1215    SLP Time Calculation (min) 75 min    Activity Tolerance Patient tolerated treatment well             Past Medical History:  Diagnosis Date   Anginal pain (HCC)    Aortic atherosclerosis    Cancer (HCC)    Carotid artery stenosis 02/01/2010   a.) Doppler 02/01/2010: 72% LICA and 60% RICA. b.) Doppler 12/30/2014: >70% LICA and 70% RICA. c.) Doppler 10/25/16: >70% Bilater ICAs   Chronic bilateral low back pain with left-sided sciatica    CKD (chronic kidney disease), stage III (HCC)    Coronary artery disease    a.) LHC 12/24/2014 -> small LAD system; diffuse CTO of LAD. 60% RCA. LM and LCx with no sig disease. no intervention; med mgmt.   Degenerative arthritis    Diastolic dysfunction    a.) TTE 09/20/2018: EF 55%, G1DD, mild LAE and RVE, triv-mild panvalvular regurgitation.   Dysarthria    GERD (gastroesophageal reflux disease)    Hepatic steatosis    History of kidney stones    HLD (hyperlipidemia)    Hypertension    Kidney stone    Lumbar radiculopathy    Major depression in remission (HCC)    PVD (peripheral vascular disease) (HCC)    Renal cyst, right 06/02/2015   a.) CT 06/02/2015 --> 4.4 cm RIGHT renal mass; MRI recommended. b.) MRI 06/16/2015 --> 3.8 x 4.2 x 4.0 cm proteinaceous/hemmorhagic cyst.   Subclavian steal syndrome    a.) LHC 12/24/2014 -->  tight eccentric 90% ostial stenosis with damping.   T2DM (type 2 diabetes mellitus) [facility])     Past Surgical History:  Procedure Laterality Date   ABDOMINAL HYSTERECTOMY     AUGMENTATION MAMMAPLASTY Bilateral 1980   BACK SURGERY  09/2018   BICEPT TENODESIS  06/10/2021   Procedure: BICEPS TENODESIS;  Surgeon: Christena Flake, MD;  Location: [facility] ORS;  Service: Orthopedics;;   CARDIAC CATHETERIZATION Left 12/24/2014   Procedure: LEFT HEARD CATHETERIZATION; Location: [facility]; Surgeon: Ander Purpura, MD   CHOLECYSTECTOMY     COLONOSCOPY N/A 02/11/2016   Procedure: COLONOSCOPY;  Surgeon: Scot Jun, MD;  Location: [facility] ENDOSCOPY;  Service: Endoscopy;  Laterality: N/A;   IR CT HEAD LTD  11/16/2021   IR CT HEAD LTD  11/16/2021   IR INTRAVSC STENT CERV CAROTID W/O EMB-PROT MOD SED INC ANGIO  11/18/2021   IR PERCUTANEOUS ART THROMBECTOMY/INFUSION INTRACRANIAL INC DIAG ANGIO  11/16/2021   RADIOLOGY WITH ANESTHESIA N/A 11/16/2021   Procedure: IR WITH ANESTHESIA- CODE STROKE;  Surgeon: Radiologist, Medication, MD;  [facility]: [facility] OR;  Service: Radiology;  Laterality: N/A;   REVERSE SHOULDER ARTHROPLASTY Right 06/10/2021   Procedure: REVERSE SHOULDER ARTHROPLASTY;  Surgeon: Christena Flake, MD;  Location: [facility] ORS;  Service:  Orthopedics;  Laterality: Right;    There were no vitals filed for this visit.   Subjective Assessment - 01/24/22 1433     Subjective "I'm much better."    Patient is accompained by: Family member   sister Windell Moulding joined at end of session   Currently in Pain? No/denies                   ADULT SLP TREATMENT - 01/24/22 1434       General Information   Behavior/Cognition Alert;Cooperative;Pleasant mood    HPI Pt is a 81 y.o. 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 Patient is s/p thrombectomy with left ICA stent placement 11/16/21. She completed inpatient rehabilitation at [facility] 11/25/21-12/07/21 and was discharged home with family. Patient known to this  clinic from prior course of voice therapy in 2018. Pt with documented hx of voice and speech changes (dysarthria) per videostroboscopy 12/26/17; did not attend therapy.      Treatment Provided   Treatment provided Cognitive-Linquistic      Pain Assessment   Pain Assessment No/denies pain      Cognitive-Linquistic Treatment   Treatment focused on Aphasia;Patient/family/caregiver education    Skilled Treatment Reassessed STGs: Pt relayed emergency info in simulated phone call 100% acc. Using semantic feature analysis, pt generated 4 descriptions of target words 85% acc; with mod cues able to generate 5 for most targets. During multi-step commands task, pt initiated repeats when necessary 85% of the time. Read/responded to simple text message accurately with extra time. SLP introduced pt to [employer]; pt shared information during initial interview with extended time and occasional min-mod cues, supported conversation. Pt expressed interest in joining reading group, as well as conversation group in [place].      Assessment / Recommendations / Plan   Plan Continue with current plan of care      Progression Toward Goals   Progression toward goals Progressing toward goals              SLP Education - 01/24/22 1438     Education Details [employer] Aphasia Project    Person(s) Educated Patient    Methods Explanation    Comprehension Verbalized understanding              SLP Short Term Goals - 01/24/22 1439       SLP SHORT TERM GOAL #1   Title Pt will communicate emergency information 100% accuracy using visual aid if necessary.    Baseline 100% 01/24/22    Time 6    Period Weeks    Status Achieved    Target Date 01/27/22      SLP SHORT TERM GOAL #2   Title Pt will generate at least 4 descriptors of target word 80% of the time using semantic feature analysis to improve abilities in wordfinding and resolving communication breakdowns.    Baseline 85% 01/24/22     Time 6    Period Weeks    Status Achieved    Target Date 01/27/22      SLP SHORT TERM GOAL #3   Title Patient will request repetition/rephrasing to aid auditory comprehension of mod complex information (multi-step commands, conversation) in 80% of opportunities.    Baseline 85% 01/24/22    Time 6    Period Weeks    Status Achieved    Target Date 01/27/22      SLP SHORT TERM GOAL #4   Title Pt will read  and reply to simple texts/emails 80% accuracy with use of accessibility features as necessary.    Baseline 01/24/22, extended time, requires occasional cues, continues to require cuing with accessibility features    Time 6    Period Weeks    Status Partially Met    Target Date 01/27/22              SLP Long Term Goals - 01/24/22 1440       SLP LONG TERM GOAL #1   Title Pt will engage in 5-8 minutes simple-mod complex conversation re: topic of interest with supported conversation, aphasia compensations.    Time 12    Period Weeks    Status On-going      SLP LONG TERM GOAL #2   Title Pt will ID and attempt repair of communication breakdowns >80% of the time in session.    Time 12    Period Weeks    Status On-going      SLP LONG TERM GOAL #3   Title Pt will demonstrate reading comprehension of mod complex 8-10 paragraphs by generating summary or answering questions >80% accuracy.    Time 12    Period Weeks    Status On-going              Plan - 01/24/22 1438     Clinical Impression Statement Pt continues to present with moderate aphasia, with expressive deficits greater than receptive deficits. She is demonstrating improved confidence as well as fluidity in simple conversation, and continues to improve frustration tolerance. Benefits from targeting languages across modalities (reading, writing, verbal expression, auditory comprehension). Demonstrating improving ability to self-monitor and correct errors with extended time. Patient met 3/4 STGs and is progressing  toward LTGs. Pt would benefit from continued skilled ST intervention per plan of care.    Speech Therapy Frequency 2x / week    Duration 2 weeks    Treatment/Interventions SLP instruction and feedback;Multimodal communcation approach;Patient/family education;Functional tasks;Cueing hierarchy;Compensatory techniques;Compensatory strategies    Potential to Achieve Goals Good    Potential Considerations Family/community support    SLP Home Exercise Plan [employer] Therapy sign-up and tasks provided last session. 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 [place], [name], [address] Phone: [phone]   Fax:  [phone]   Name: YATZEL VENIER MRN: [id number] Date of Birth: [birth year]

## 2022-01-25 ENCOUNTER — Encounter: Payer: Self-pay | Admitting: Occupational Therapy

## 2022-01-25 NOTE — Therapy (Signed)
[place] MAIN [facility] SERVICES [address] [place], [place], [address] Phone: [phone]   Fax:  [phone]  Occupational Therapy Treatment  Patient Details  Name: Cassidy Hernandez MRN: [id number] Date of Birth: Jul 14, 1941 Referring Provider (OT): Dr. Frazier Richards   Encounter Date: 01/24/2022   OT End of Session - 01/25/22 2304     Visit Number 8    Number of Visits 24    Date for OT Re-Evaluation 03/13/22    OT Start Time 1017    OT Stop Time 1345    OT Time Calculation (min) 47 min    Equipment Utilized During Treatment 4 wheeled walker    Activity Tolerance Patient tolerated treatment well    Behavior During Therapy [facility] for tasks assessed/performed             Past Medical History:  Diagnosis Date   Anginal pain ([place])    Aortic atherosclerosis    Cancer ([place])    Carotid artery stenosis 02/01/2010   a.) Doppler 51/09/5850: 77% LICA and 82% RICA. b.) Doppler 42/35/3614: >43% LICA and 15% RICA. c.) Doppler 10/25/16: >70% Bilater ICAs   Chronic bilateral low back pain with left-sided sciatica    CKD (chronic kidney disease), stage III (HCC)    Coronary artery disease    a.) LHC 12/24/2014 -> small LAD system; diffuse CTO of LAD. 60% RCA. LM and LCx with no sig disease. no intervention; med mgmt.   Degenerative arthritis    Diastolic dysfunction    a.) TTE 09/20/2018: EF 55%, G1DD, mild LAE and RVE, triv-mild panvalvular regurgitation.   Dysarthria    GERD (gastroesophageal reflux disease)    Hepatic steatosis    History of kidney stones    HLD (hyperlipidemia)    Hypertension    Kidney stone    Lumbar radiculopathy    Major depression in remission ([place])    PVD (peripheral vascular disease) ([place])    Renal cyst, right 06/02/2015   a.) CT 06/02/2015 --> 4.4 cm RIGHT renal mass; MRI recommended. b.) MRI 06/16/2015 --> 3.8 x 4.2 x 4.0 cm proteinaceous/hemmorhagic cyst.   Subclavian steal syndrome    a.) LHC 12/24/2014 -->  tight eccentric 90% ostial stenosis with damping.   T2DM (type 2 diabetes mellitus) [facility])     Past Surgical History:  Procedure Laterality Date   ABDOMINAL HYSTERECTOMY     AUGMENTATION MAMMAPLASTY Bilateral 1980   BACK SURGERY  09/2018   BICEPT TENODESIS  06/10/2021   Procedure: BICEPS TENODESIS;  Surgeon: Corky Mull, MD;  Location: [facility] ORS;  Service: Orthopedics;;   CARDIAC CATHETERIZATION Left 12/24/2014   Procedure: LEFT HEARD CATHETERIZATION; Location: [facility]; Surgeon: Laurence Aly, MD   CHOLECYSTECTOMY     COLONOSCOPY N/A 02/11/2016   Procedure: COLONOSCOPY;  Surgeon: Manya Silvas, MD;  Location: [facility] ENDOSCOPY;  Service: Endoscopy;  Laterality: N/A;   IR CT HEAD LTD  11/16/2021   IR CT HEAD LTD  11/16/2021   IR INTRAVSC STENT CERV CAROTID W/O EMB-PROT MOD SED INC ANGIO  11/18/2021   IR PERCUTANEOUS ART THROMBECTOMY/INFUSION INTRACRANIAL INC DIAG ANGIO  11/16/2021   RADIOLOGY WITH ANESTHESIA N/A 11/16/2021   Procedure: IR WITH ANESTHESIA- CODE STROKE;  Surgeon: Radiologist, Medication, MD;  Location: [place];  Service: Radiology;  Laterality: N/A;   REVERSE SHOULDER ARTHROPLASTY Right 06/10/2021   Procedure: REVERSE SHOULDER ARTHROPLASTY;  Surgeon: Corky Mull, MD;  Location: [facility] ORS;  Service: Orthopedics;  Laterality: Right;  There were no vitals filed for this visit.   Subjective Assessment - 01/25/22 2303     Subjective  Pt reports she is doing much better, went to see her doctor who placed her back on her blood pressure meds she was taking prior to stroke.    Pertinent History Recent L MCA, s/p thrombectomy and ICA stenting    Limitations aphasia, balance deficits, generalized weakness, hx of falls.    Patient Stated Goals Return to being indep and walk without AD    Currently in Pain? No/denies    Pain Score 0-No pain    Pain Onset More than a month ago            Reports she went to see her cardiologist last Thursday after having an episode of chest pain and  low blood pressure last week.  She states, they added a medication and she feels much better.   Pt reports pain in her right leg 8/10.   Therapeutic Exercises:   SAEBO looped ball tower from seated position to place looped balls onto the first 3 levels, 4th level she was unable to reach from sitting, completed in standing. Pt then stood to complete another round of reaching from standing position with bilateral UEs, pt feels she needs to hold onto the table with one hand for balance while engaging in other hand for task.  Therapist provided Min guard during task for safety.    ADL:  Pt seen for handwriting with activity of sorting words into 2 categories, 100% accuracy, fair legibility.   Pt formulating list of 10 vegetables with legibility of , some difficulty recalling a list of 10 items, able to get 7/10 before requiring cues for the remainder.    Neuro: Standing to pick up and place med to large washers onto magnetic hooks with easel placed in elevation.  Completed items with right and left hands with cues to not hold onto table, min guard for balance.  Completed another round of task from seated position to encourage reaching patterns overhead.    Response to tx:  Pt has continued to progress with exercises in standing and sitting positions.  Handwriting this date with fair to good legibility.  With activities in standing, she requires min guard for safety and tends to hold onto surface with one hand while engaging other hand.  She is able to complete tasks without holding on but reports she feels unsteady, min guard to min assist.  Continue to work towards improving ROM, strength, and balance for daily tasks.                       OT Education - 01/25/22 2304     Education Details strengthening, balance for tasks in standing, coordination    Person(s) Educated Patient    Methods Explanation;Verbal cues;Demonstration    Comprehension Verbalized understanding;Verbal cues  required;Need further instruction;Returned demonstration              OT Short Term Goals - 12/21/21 0946       OT SHORT TERM GOAL #1   Title Pt will be indep with theraputty HEP for increasing bilat grip/pinch strength.    Baseline Eval: not yet initiated    Time 6    Period Weeks    Status New    Target Date 01/30/22      OT SHORT TERM GOAL #2   Title Pt will be indep to verbalize 3 fall prevention strategies for  safe ADL completion.    Baseline Eval: not yet initiated (hx of 4 falls in the last 6 months)    Time 6    Period Weeks    Status New    Target Date 01/30/22               OT Long Term Goals - 12/21/21 0947       OT LONG TERM GOAL #1   Title Pt will increase FOTO score to 45 or better.    Baseline Eval: FOTO 32 (OT estimates this # should be higher based on objective testing; likely inaccurate answer selections d/t aphasia)    Time 12    Period Weeks    Status New    Target Date 03/13/22      OT LONG TERM GOAL #2   Title Pt will increase bilat grip strength by 5 or more lbs for improved ability to hold and carry ADL supplies.    Baseline Eval: R 23, L 25    Time 12    Period Weeks    Status New    Target Date 03/13/22      OT LONG TERM GOAL #3   Title Pt will perform tub transfer with modified indep.    Baseline Eval: min-mod A (FOF)    Time 12    Period Weeks    Status New    Target Date 03/13/22      OT LONG TERM GOAL #4   Title Pt will manage hot/cold meal prep with modified indep    Baseline Eval: family currently managing meals    Time 12    Period Weeks    Status New    Target Date 03/13/22      OT LONG TERM GOAL #5   Title Pt will manage medications with modified indep    Baseline Eval: Pt and daughter manage together since CVA    Time 37    Period Weeks    Status New    Target Date 03/13/22      Long Term Additional Goals   Additional Long Term Goals Yes      OT LONG TERM GOAL #6   Title Pt will safely reach in all  directions and planes with unilateral to no UE support in order to perform ADL/IADLs with LRD.    Baseline Eval: Pt using rollator and has not yet attempted IADL tasks (family managing IADLs)    Time 12    Period Weeks    Status New    Target Date 03/13/22                   Plan - 01/25/22 2304     Clinical Impression Statement Pt has continued to progress with exercises in standing and sitting positions.  Handwriting this date with fair to good legibility.  With activities in standing, she requires min guard for safety and tends to hold onto surface with one hand while engaging other hand.  She is able to complete tasks without holding on but reports she feels unsteady, min guard to min assist.  Continue to work towards improving ROM, strength, and balance for daily tasks.    OT Occupational Profile and History Problem Focused Assessment - Including review of records relating to presenting problem    Occupational performance deficits (Please refer to evaluation for details): ADL's;IADL's;Leisure;Social Participation    Body Structure / Function / Physical Skills ADL;Coordination;Endurance;GMC;UE functional use;Balance;Decreased knowledge of precautions;IADL;Strength;Dexterity;FMC;Gait;Mobility    Cognitive  Skills Understand    Rehab Potential Good    Clinical Decision Making Several treatment options, min-mod task modification necessary    Comorbidities Affecting Occupational Performance: None    Modification or Assistance to Complete Evaluation  Min-Moderate modification of tasks or assist with assess necessary to complete eval   extra time to process commands and formulate answers to questions d/t aphasia   OT Frequency 2x / week    OT Duration 12 weeks    OT Treatment/Interventions Self-care/ADL training;Therapeutic exercise;DME and/or AE instruction;Functional Mobility Training;Balance training;Neuromuscular education;Therapeutic activities;Patient/family education;Coping  strategies training    Plan OT focus on UB strengthening, dynamic standing balance, IADLs, med management    OT Home Exercise Plan not yet initiated    Consulted and Agree with Plan of Care Patient;Family member/caregiver             Patient will benefit from skilled therapeutic intervention in order to improve the following deficits and impairments:   Body Structure / Function / Physical Skills: ADL, Coordination, Endurance, GMC, UE functional use, Balance, Decreased knowledge of precautions, IADL, Strength, Dexterity, FMC, Gait, Mobility Cognitive Skills: Understand     Visit Diagnosis: Muscle weakness (generalized)  Other lack of coordination  Unsteadiness on feet    Problem List Patient Active Problem List   Diagnosis Date Noted   Acute ischemic left MCA stroke ([place]) 11/16/2021   Middle cerebral artery embolism, 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

## 2022-01-26 ENCOUNTER — Ambulatory Visit: Payer: Medicare Other | Admitting: Occupational Therapy

## 2022-01-26 DIAGNOSIS — M6281 Muscle weakness (generalized): Secondary | ICD-10-CM

## 2022-01-26 DIAGNOSIS — R4701 Aphasia: Secondary | ICD-10-CM | POA: Diagnosis not present

## 2022-01-26 NOTE — Therapy (Deleted)
[place] MAIN [facility] SERVICES [address] [place], [place], [address] Phone: [phone]   Fax:  [phone]  Occupational Therapy Treatment  Patient Details  Name: Cassidy Hernandez MRN: [id number] Date of Birth: 1940-12-15 Referring Provider (OT): Dr. Frazier Richards   Encounter Date: 01/26/2022    Past Medical History:  Diagnosis Date   Anginal pain ([place])    Aortic atherosclerosis    Cancer ([place])    Carotid artery stenosis 02/01/2010   a.) Doppler 40/76/8088: 11% LICA and 03% RICA. b.) Doppler 15/94/5859: >29% LICA and 24% RICA. c.) Doppler 10/25/16: >70% Bilater ICAs   Chronic bilateral low back pain with left-sided sciatica    CKD (chronic kidney disease), stage III ([place])    Coronary artery disease    a.) LHC 12/24/2014 -> small LAD system; diffuse CTO of LAD. 60% RCA. LM and LCx with no sig disease. no intervention; med mgmt.   Degenerative arthritis    Diastolic dysfunction    a.) TTE 09/20/2018: EF 55%, G1DD, mild LAE and RVE, triv-mild panvalvular regurgitation.   Dysarthria    GERD (gastroesophageal reflux disease)    Hepatic steatosis    History of kidney stones    HLD (hyperlipidemia)    Hypertension    Kidney stone    Lumbar radiculopathy    Major depression in remission ([place])    PVD (peripheral vascular disease) ([place])    Renal cyst, right 06/02/2015   a.) CT 06/02/2015 --> 4.4 cm RIGHT renal mass; MRI recommended. b.) MRI 06/16/2015 --> 3.8 x 4.2 x 4.0 cm proteinaceous/hemmorhagic cyst.   Subclavian steal syndrome    a.) LHC 12/24/2014 --> tight eccentric 90% ostial stenosis with damping.   T2DM (type 2 diabetes mellitus) [facility])     Past Surgical History:  Procedure Laterality Date   ABDOMINAL HYSTERECTOMY     AUGMENTATION MAMMAPLASTY Bilateral 1980   BACK SURGERY  09/2018   BICEPT TENODESIS  06/10/2021   Procedure: BICEPS TENODESIS;  Surgeon: Corky Mull, MD;  Location: [place] [facility];  Service: Orthopedics;;    CARDIAC CATHETERIZATION Left 12/24/2014   Procedure: LEFT HEARD CATHETERIZATION; Location: [facility]; Surgeon: Laurence Aly, MD   CHOLECYSTECTOMY     COLONOSCOPY N/A 02/11/2016   Procedure: COLONOSCOPY;  Surgeon: Manya Silvas, MD;  Location: [facility];  Service: Endoscopy;  Laterality: N/A;   IR CT HEAD LTD  11/16/2021   IR CT HEAD LTD  11/16/2021   IR INTRAVSC STENT CERV CAROTID W/O EMB-PROT MOD SED INC ANGIO  11/18/2021   IR PERCUTANEOUS ART THROMBECTOMY/INFUSION INTRACRANIAL INC DIAG ANGIO  11/16/2021   RADIOLOGY WITH ANESTHESIA N/A 11/16/2021   Procedure: IR WITH ANESTHESIA- CODE STROKE;  Surgeon: Radiologist, Medication, MD;  Location: [place];  Service: Radiology;  Laterality: N/A;   REVERSE SHOULDER ARTHROPLASTY Right 06/10/2021   Procedure: REVERSE SHOULDER ARTHROPLASTY;  Surgeon: Corky Mull, MD;  Location: [facility];  Service: Orthopedics;  Laterality: Right;    There were no vitals filed for this visit.                           OT Short Term Goals - 12/21/21 0946       OT SHORT TERM GOAL #1   Title Pt will be indep with theraputty HEP for increasing bilat grip/pinch strength.    Baseline Eval: not yet initiated    Time 6    Period Weeks    Status New  Target Date 01/30/22      OT SHORT TERM GOAL #2   Title Pt will be indep to verbalize 3 fall prevention strategies for safe ADL completion.    Baseline Eval: not yet initiated (hx of 4 falls in the last 6 months)    Time 6    Period Weeks    Status New    Target Date 01/30/22               OT Long Term Goals - 12/21/21 0947       OT LONG TERM GOAL #1   Title Pt will increase FOTO score to 45 or better.    Baseline Eval: FOTO 32 (OT estimates this # should be higher based on objective testing; likely inaccurate answer selections d/t aphasia)    Time 12    Period Weeks    Status New    Target Date 03/13/22      OT LONG TERM GOAL #2   Title Pt will increase bilat grip strength by 5 or  more lbs for improved ability to hold and carry ADL supplies.    Baseline Eval: R 23, L 25    Time 12    Period Weeks    Status New    Target Date 03/13/22      OT LONG TERM GOAL #3   Title Pt will perform tub transfer with modified indep.    Baseline Eval: min-mod A (FOF)    Time 12    Period Weeks    Status New    Target Date 03/13/22      OT LONG TERM GOAL #4   Title Pt will manage hot/cold meal prep with modified indep    Baseline Eval: family currently managing meals    Time 12    Period Weeks    Status New    Target Date 03/13/22      OT LONG TERM GOAL #5   Title Pt will manage medications with modified indep    Baseline Eval: Pt and daughter manage together since CVA    Time 50    Period Weeks    Status New    Target Date 03/13/22      Long Term Additional Goals   Additional Long Term Goals Yes      OT LONG TERM GOAL #6   Title Pt will safely reach in all directions and planes with unilateral to no UE support in order to perform ADL/IADLs with LRD.    Baseline Eval: Pt using rollator and has not yet attempted IADL tasks (family managing IADLs)    Time 75    Period Weeks    Status New    Target Date 03/13/22                    Patient will benefit from skilled therapeutic intervention in order to improve the following deficits and impairments:           Visit Diagnosis: Muscle weakness (generalized)    Problem List Patient Active Problem List   Diagnosis Date Noted   Acute ischemic left MCA stroke ([place]) 11/16/2021   Middle cerebral artery embolism, 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   Status post reverse arthroplasty of shoulder, right 06/10/2021    Harrel Carina, OT 01/26/2022, 12:31 PM  [place], [place], [address] Phone: [phone]   Fax:  [phone]  Name: Cassidy Hernandez MRN: [id number] Date of Birth:  01/26/1941  

## 2022-01-26 NOTE — Therapy (Signed)
OUTPATIENT OCCUPATIONAL THERAPY TREATMENT NOTE   Patient Name: RHONDALYN CLINGAN MRN: [id number] DOB:1941-05-29, 81 y.o., female Today's Date: 01/26/2022  PCP: Frazier Richards REFERRING PROVIDER: Merrilee Seashore   OT End of Session - 01/26/22 1230     Visit Number 9    Number of Visits 24    Date for OT Re-Evaluation 03/13/22    OT Start Time 78    OT Stop Time 1215    OT Time Calculation (min) 45 min    Equipment Utilized During Treatment 4 wheeled walker    Activity Tolerance Patient tolerated treatment well    Behavior During Therapy WFL for tasks assessed/performed             Past Medical History:  Diagnosis Date   Anginal pain ([place])    Aortic atherosclerosis    Cancer ([place])    Carotid artery stenosis 02/01/2010   a.) Doppler 95/04/3266: 12% LICA and 45% RICA. b.) Doppler 80/99/8338: >25% LICA and 05% RICA. c.) Doppler 10/25/16: >70% Bilater ICAs   Chronic bilateral low back pain with left-sided sciatica    CKD (chronic kidney disease), stage III ([place])    Coronary artery disease    a.) LHC 12/24/2014 -> small LAD system; diffuse CTO of LAD. 60% RCA. LM and LCx with no sig disease. no intervention; med mgmt.   Degenerative arthritis    Diastolic dysfunction    a.) TTE 09/20/2018: EF 55%, G1DD, mild LAE and RVE, triv-mild panvalvular regurgitation.   Dysarthria    GERD (gastroesophageal reflux disease)    Hepatic steatosis    History of kidney stones    HLD (hyperlipidemia)    Hypertension    Kidney stone    Lumbar radiculopathy    Major depression in remission ([place])    PVD (peripheral vascular disease) ([place])    Renal cyst, right 06/02/2015   a.) CT 06/02/2015 --> 4.4 cm RIGHT renal mass; MRI recommended. b.) MRI 06/16/2015 --> 3.8 x 4.2 x 4.0 cm proteinaceous/hemmorhagic cyst.   Subclavian steal syndrome    a.) LHC 12/24/2014 --> tight eccentric 90% ostial stenosis with damping.   T2DM (type 2 diabetes mellitus) [facility])    Past Surgical History:  Procedure  Laterality Date   ABDOMINAL HYSTERECTOMY     AUGMENTATION MAMMAPLASTY Bilateral 1980   BACK SURGERY  09/2018   BICEPT TENODESIS  06/10/2021   Procedure: BICEPS TENODESIS;  Surgeon: Corky Mull, MD;  Location: [place] [facility];  Service: Orthopedics;;   CARDIAC CATHETERIZATION Left 12/24/2014   Procedure: LEFT HEARD CATHETERIZATION; Location: [facility]; Surgeon: Laurence Aly, MD   CHOLECYSTECTOMY     COLONOSCOPY N/A 02/11/2016   Procedure: COLONOSCOPY;  Surgeon: Manya Silvas, MD;  Location: [facility];  Service: Endoscopy;  Laterality: N/A;   IR CT HEAD LTD  11/16/2021   IR CT HEAD LTD  11/16/2021   IR INTRAVSC STENT CERV CAROTID W/O EMB-PROT MOD SED INC ANGIO  11/18/2021   IR PERCUTANEOUS ART THROMBECTOMY/INFUSION INTRACRANIAL INC DIAG ANGIO  11/16/2021   RADIOLOGY WITH ANESTHESIA N/A 11/16/2021   Procedure: IR WITH ANESTHESIA- CODE STROKE;  Surgeon: Radiologist, Medication, MD;  Location: [place];  Service: Radiology;  Laterality: N/A;   REVERSE SHOULDER ARTHROPLASTY Right 06/10/2021   Procedure: REVERSE SHOULDER ARTHROPLASTY;  Surgeon: Corky Mull, MD;  Location: [facility];  Service: Orthopedics;  Laterality: Right;   Patient Active Problem List   Diagnosis Date Noted   Acute ischemic left MCA stroke ([place]) 11/16/2021   Middle cerebral artery embolism, left  11/16/2021   Status post reverse arthroplasty of shoulder, right 06/10/2021        THERAPY DIAG:  Muscle weakness (generalized)  Rationale for Evaluation and Treatment Rehabilitation  PERTINENT HISTORY: Recent L MCA, s/p thrombectomy and ICA stenting  PRECAUTIONS: No  SUBJECTIVE: Pt. Was present with her sister  PAIN:  Are you having pain? Yes: NPRS scale: 8/10 Pain location: Right shoulder Pain description: pain       OBJECTIVE:   TODAY'S TREATMENT:          Pt. worked on unsupported standing balance skills while reaching for [employer] rings, and moving them through 4 levels of horizontal rungs placed at progressively  higher heights x's 2 sets with the right, with the 2nd trial positioned to the right to encourage reaching across midline with the right. Pt. worked on grasping large flat shapes, and moving them through varying heights of vertical dowels with the left UE.Pt. worked on reaching into the cabinetry for [employer]. While standing unsupported. Pt. Performed sit to stand reps from a chair without using her arms. Pt. Worked on pinch strengthening in the bilateral hands for lateral, and 3pt. pinch using yellow, red, green, and blue resistive clips. Pt. worked on placing the clips at various vertical and horizontal angles. Tactile and verbal cues were required for eliciting the desired movement. Pt. performed bilateral gross gripping with a gross grip strengthener. Pt. worked on sustaining grip while grasping pegs and reaching at various heights. The Gripper was set to  17.9  # of grip strength resistance.     PATIENT EDUCATION: Education details: Standing balance Person educated: Patient Education method: Explanation, Demonstration, Tactile cues, and Verbal cues Education comprehension: verbalized understanding and returned demonstration       OT Short Term Goals - 01/26/22 1234       OT SHORT TERM GOAL #1   Title Pt will be indep with theraputty HEP for increasing bilat grip/pinch strength.    Baseline Eval: not yet initiated    Time 6    Period Weeks    Status New    Target Date 01/30/22      OT SHORT TERM GOAL #2   Title Pt will be indep to verbalize 3 fall prevention strategies for safe ADL completion.    Baseline Eval: not yet initiated (hx of 4 falls in the last 6 months)    Time 6    Period Weeks    Status New    Target Date 01/30/22              OT Long Term Goals - 01/26/22 1235       OT LONG TERM GOAL #1   Title Pt will increase FOTO score to 45 or better.    Baseline Eval: FOTO 32 (OT estimates this # should be higher based on objective testing; likely inaccurate answer  selections d/t aphasia)    Time 12    Period Weeks    Status New    Target Date 03/13/22      OT LONG TERM GOAL #2   Title Pt will increase bilat grip strength by 5 or more lbs for improved ability to hold and carry ADL supplies.    Baseline Eval: R 23, L 25    Time 12    Period Weeks    Status New    Target Date 03/13/22      OT LONG TERM GOAL #3   Title Pt will perform tub transfer with modified indep.  Baseline Eval: min-mod A (FOF)    Time 12    Period Weeks    Status New    Target Date 03/13/22      OT LONG TERM GOAL #4   Title Pt will manage hot/cold meal prep with modified indep    Baseline Eval: family currently managing meals    Time 12    Period Weeks    Status New    Target Date 03/13/22      OT LONG TERM GOAL #5   Title Pt will manage medications with modified indep    Baseline Eval: Pt and daughter manage together since CVA    Time 12    Period Weeks    Status New    Target Date 03/13/22      OT LONG TERM GOAL #6   Title Pt will safely reach in all directions and planes with unilateral to no UE support in order to perform ADL/IADLs with LRD.    Baseline Eval: Pt using rollator and has not yet attempted IADL tasks (family managing IADLs)    Time 12    Period Weeks    Status New    Target Date 03/13/22              Plan - 01/26/22 1253     Clinical Impression Statement Pt. has made progress overall. Pt. reports that tub/shower transfers have improved. Pt. demonstrated good unsupported standing balance while reaching with no LOB. Pt. did require seated rest breaks. Pt. required cues to widen her BOS during sit to stand reps. Pt. continues to work on improving overall balance, and UE strength on order to improve UE functioning,a nd maximize independence with  ADLs, and IADL tasks,    OT Occupational Profile and History Problem Focused Assessment - Including review of records relating to presenting problem    Occupational performance deficits (Please  refer to evaluation for details): ADL's;IADL's;Leisure;Social Participation    Body Structure / Function / Physical Skills ADL;Coordination;Endurance;GMC;UE functional use;Balance;Decreased knowledge of precautions;IADL;Strength;Dexterity;FMC;Gait;Mobility    Cognitive Skills Understand    Rehab Potential Good    Clinical Decision Making Several treatment options, min-mod task modification necessary    Comorbidities Affecting Occupational Performance: None    Modification or Assistance to Complete Evaluation  Min-Moderate modification of tasks or assist with assess necessary to complete eval    OT Frequency 2x / week    OT Duration 12 weeks    OT Treatment/Interventions Self-care/ADL training;Therapeutic exercise;DME and/or AE instruction;Functional Mobility Training;Balance training;Neuromuscular education;Therapeutic activities;Patient/family education;Coping strategies training    Plan OT focus on UB strengthening, dynamic standing balance, IADLs, med management    Consulted and Agree with Plan of Care Patient;Family member/caregiver            Harrel Carina, MS, OTR/L   Harrel Carina, OT 01/26/2022, 12:59 PM

## 2022-01-27 ENCOUNTER — Ambulatory Visit: Payer: Medicare Other | Admitting: Speech Pathology

## 2022-01-27 ENCOUNTER — Encounter: Payer: Medicare Other | Admitting: Occupational Therapy

## 2022-01-27 ENCOUNTER — Ambulatory Visit: Payer: Medicare Other | Admitting: Physical Therapy

## 2022-01-27 ENCOUNTER — Encounter: Payer: Self-pay | Admitting: Physical Therapy

## 2022-01-27 DIAGNOSIS — R278 Other lack of coordination: Secondary | ICD-10-CM

## 2022-01-27 DIAGNOSIS — R269 Unspecified abnormalities of gait and mobility: Secondary | ICD-10-CM

## 2022-01-27 DIAGNOSIS — R2689 Other abnormalities of gait and mobility: Secondary | ICD-10-CM

## 2022-01-27 DIAGNOSIS — R4701 Aphasia: Secondary | ICD-10-CM

## 2022-01-27 DIAGNOSIS — R262 Difficulty in walking, not elsewhere classified: Secondary | ICD-10-CM

## 2022-01-27 DIAGNOSIS — R296 Repeated falls: Secondary | ICD-10-CM

## 2022-01-27 DIAGNOSIS — I63512 Cerebral infarction due to unspecified occlusion or stenosis of left middle cerebral artery: Secondary | ICD-10-CM

## 2022-01-27 DIAGNOSIS — R2681 Unsteadiness on feet: Secondary | ICD-10-CM

## 2022-01-27 DIAGNOSIS — M6281 Muscle weakness (generalized): Secondary | ICD-10-CM

## 2022-01-27 DIAGNOSIS — R471 Dysarthria and anarthria: Secondary | ICD-10-CM

## 2022-01-27 NOTE — Patient Instructions (Signed)
Increase specificity during conversations at home/community  Continue increased frequency of conversation with friends/family for practice communicating

## 2022-01-27 NOTE — Therapy (Addendum)
[place] MAIN [facility] SERVICES [address] [place], [place], [address] Phone: [phone]   Fax:  [phone]  Speech Language Pathology Treatment  Patient Details  Name: Cassidy Hernandez MRN: [id number] Date of Birth: Aug 25, 1940 Referring Provider (SLP): Merrilee Seashore, NP   Encounter Date: 01/27/2022   End of Session - 01/27/22 1525     Visit Number 12    Number of Visits 25    Date for SLP Re-Evaluation 03/16/22    Authorization Type [employer] MCR; $10 copay    Authorization - Visit Number 2    Progress Note Due on Visit 10    SLP Start Time 1400    SLP Stop Time  1500    SLP Time Calculation (min) 60 min    Activity Tolerance Patient tolerated treatment well             Past Medical History:  Diagnosis Date   Anginal pain ([place])    Aortic atherosclerosis    Cancer ([place])    Carotid artery stenosis 02/01/2010   a.) Doppler 03/70/4888: 91% LICA and 69% RICA. b.) Doppler 45/10/8880: >80% LICA and 03% RICA. c.) Doppler 10/25/16: >70% Bilater ICAs   Chronic bilateral low back pain with left-sided sciatica    CKD (chronic kidney disease), stage III ([place])    Coronary artery disease    a.) LHC 12/24/2014 -> small LAD system; diffuse CTO of LAD. 60% RCA. LM and LCx with no sig disease. no intervention; med mgmt.   Degenerative arthritis    Diastolic dysfunction    a.) TTE 09/20/2018: EF 55%, G1DD, mild LAE and RVE, triv-mild panvalvular regurgitation.   Dysarthria    GERD (gastroesophageal reflux disease)    Hepatic steatosis    History of kidney stones    HLD (hyperlipidemia)    Hypertension    Kidney stone    Lumbar radiculopathy    Major depression in remission ([place])    PVD (peripheral vascular disease) ([place])    Renal cyst, right 06/02/2015   a.) CT 06/02/2015 --> 4.4 cm RIGHT renal mass; MRI recommended. b.) MRI 06/16/2015 --> 3.8 x 4.2 x 4.0 cm proteinaceous/hemmorhagic cyst.   Subclavian steal syndrome    a.) LHC 12/24/2014 -->  tight eccentric 90% ostial stenosis with damping.   T2DM (type 2 diabetes mellitus) [facility])     Past Surgical History:  Procedure Laterality Date   ABDOMINAL HYSTERECTOMY     AUGMENTATION MAMMAPLASTY Bilateral 1980   BACK SURGERY  09/2018   BICEPT TENODESIS  06/10/2021   Procedure: BICEPS TENODESIS;  Surgeon: Corky Mull, MD;  Location: [place] [facility];  Service: Orthopedics;;   CARDIAC CATHETERIZATION Left 12/24/2014   Procedure: LEFT HEARD CATHETERIZATION; Location: [facility]; Surgeon: Laurence Aly, MD   CHOLECYSTECTOMY     COLONOSCOPY N/A 02/11/2016   Procedure: COLONOSCOPY;  Surgeon: Manya Silvas, MD;  Location: [facility] ENDOSCOPY;  Service: Endoscopy;  Laterality: N/A;   IR CT HEAD LTD  11/16/2021   IR CT HEAD LTD  11/16/2021   IR INTRAVSC STENT CERV CAROTID W/O EMB-PROT MOD SED INC ANGIO  11/18/2021   IR PERCUTANEOUS ART THROMBECTOMY/INFUSION INTRACRANIAL INC DIAG ANGIO  11/16/2021   RADIOLOGY WITH ANESTHESIA N/A 11/16/2021   Procedure: IR WITH ANESTHESIA- CODE STROKE;  Surgeon: Radiologist, Medication, MD;  Location: [place];  Service: Radiology;  Laterality: N/A;   REVERSE SHOULDER ARTHROPLASTY Right 06/10/2021   Procedure: REVERSE SHOULDER ARTHROPLASTY;  Surgeon: Corky Mull, MD;  Location: [facility];  Service:  Orthopedics;  Laterality: Right;    There were no vitals filed for this visit.   Subjective Assessment - 01/27/22 1508     Subjective "tell Cassidy Hernandez we missed her"    Patient is accompained by: --   Caregiver Cassidy Hernandez   Currently in Pain? No/denies                   ADULT SLP TREATMENT - 01/27/22 0001       General Information   Behavior/Cognition Alert;Cooperative;Pleasant mood    HPI Pt is a 81 y.o. 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 Patient is s/p thrombectomy with left ICA stent placement 11/16/21. She completed inpatient rehabilitation at [facility] 11/25/21-12/07/21 and was discharged home with family. Patient known to this clinic from  prior course of voice therapy in 2018. Pt with documented hx of voice and speech changes (dysarthria) per videostroboscopy 12/26/17; did not attend therapy.      Treatment Provided   Treatment provided Cognitive-Linquistic      Pain Assessment   Pain Assessment No/denies pain      Cognitive-Linquistic Treatment   Treatment focused on Aphasia;Patient/family/caregiver education    Skilled Treatment SLP intervention targeted verbal expression at conversational level with an unfamiliar listener (clinician new to patient). Patient demonstrated functional verbal communication of meaningul information x28 utterances. 18/28 utterances were demonstrated independently and successfully. x10 communication breakdowns occurred 2' anomia, vague word use, or name substitutions. Improved communication breakdowns with moderate level context or sentence lead in cues by SLP. Patient completed structured VNEST task for improved expression via sentence creation for single verb with 80% accuracy x5, improved to 100% accuracy with selection options.      Assessment / Recommendations / Plan   Plan Continue with current plan of care      Progression Toward Goals   Progression toward goals Progressing toward goals              SLP Education - 01/27/22 1523     Education Details Good verbal output    Person(s) Educated Patient    Methods Explanation;Demonstration    Comprehension Verbalized understanding;Need further instruction              SLP Short Term Goals - 01/24/22 1439       SLP SHORT TERM GOAL #1   Title Pt will communicate emergency information 100% accuracy using visual aid if necessary.    Baseline 100% 01/24/22    Time 6    Period Weeks    Status Achieved   Will reassess next visit.   Target Date 01/27/22      SLP SHORT TERM GOAL #2   Title Pt will generate at least 4 descriptors of target word 80% of the time using semantic feature analysis to improve abilities in wordfinding and  resolving communication breakdowns.    Baseline 85% 01/24/22    Time 6    Period Weeks    Status Achieved   Will reassess next visit.   Target Date 01/27/22      SLP SHORT TERM GOAL #3   Title Patient will request repetition/rephrasing to aid auditory comprehension of mod complex information (multi-step commands, conversation) in 80% of opportunities.    Baseline 85% 01/24/22    Time 6    Period Weeks    Status Achieved   Will reassess next visit.   Target Date 01/27/22      SLP SHORT TERM GOAL #4  Title Pt will read and reply to simple texts/emails 80% accuracy with use of accessibility features as necessary.    Baseline 01/24/22, extended time, requires occasional cues, continues to require cuing with accessibility features    Time 6    Period Weeks    Status Partially Met   Will reassess next visit.   Target Date 01/27/22              SLP Long Term Goals - 01/24/22 1440       SLP LONG TERM GOAL #1   Title Pt will engage in 5-8 minutes simple-mod complex conversation re: topic of interest with supported conversation, aphasia compensations.    Time 12    Period Weeks    Status On-going      SLP LONG TERM GOAL #2   Title Pt will ID and attempt repair of communication breakdowns >80% of the time in session.    Time 12    Period Weeks    Status On-going      SLP LONG TERM GOAL #3   Title Pt will demonstrate reading comprehension of mod complex 8-10 paragraphs by generating summary or answering questions >80% accuracy.    Time 12    Period Weeks    Status On-going              Plan - 01/27/22 1525     Clinical Impression Statement Pt continues to present with moderate aphasia, with expressive deficits greater than receptive deficits. She is demonstrating improved confidence as well as fluidity in simple conversation, and continues to improve frustration tolerance. Benefits from targeting languages across modalities (reading, writing, verbal expression, auditory  comprehension). Demonstrating improving ability to self-monitor and correct errors with extended time. Patient is progressing toward LTGs. Pt would benefit from continued skilled ST intervention per plan of care.    Speech Therapy Frequency 2x / week    Duration 2 weeks    Treatment/Interventions SLP instruction and feedback;Multimodal communcation approach;Patient/family education;Functional tasks;Cueing hierarchy;Compensatory techniques;Compensatory strategies    Potential to Achieve Goals Good    Potential Considerations Family/community support    SLP Home Exercise Plan [facility] sign-up and tasks provided last session.    Consulted and Agree with Plan of Care Patient             Patient will benefit from skilled therapeutic intervention in order to improve the following deficits and impairments:   Aphasia    Problem List Patient Active Problem List   Diagnosis Date Noted   Acute ischemic left MCA stroke ([place]) 11/16/2021   Middle cerebral artery embolism, 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   Status post reverse arthroplasty of shoulder, right 06/10/2021   Ethelene Hal, M.A. Jolly, CCC-SLP 01/27/2022, 3:27 PM  [place] MAIN [facility] SERVICES [address] [place], [place], [address] Phone: [phone]   Fax:  [phone]   Name: ITZEL MCKIBBIN MRN: [id number] Date of Birth: Jan 05, 1941

## 2022-01-27 NOTE — Therapy (Signed)
OUTPATIENT PHYSICAL THERAPY TREATMENT NOTE   Patient Name: Cassidy Hernandez MRN: [id number] DOB:1941-07-07, 81 y.o., female Today's Date: 01/27/2022  PCP: Kirk Ruths, MD REFERRING PROVIDER: Rayann Heman, NP   PT End of Session - 01/27/22 1300     Visit Number 9    Number of Visits 24    Date for PT Re-Evaluation 03/15/22    Authorization Type BCBS Medicare    Authorization Time Period 12/21/21-03/15/22    Progress Note Due on Visit 10    PT Start Time 1301    PT Stop Time 1345    PT Time Calculation (min) 44 min    Equipment Utilized During Treatment Gait belt    Activity Tolerance Patient tolerated treatment well    Behavior During Therapy [facility] for tasks assessed/performed             Past Medical History:  Diagnosis Date   Anginal pain ([place])    Aortic atherosclerosis    Cancer ([place])    Carotid artery stenosis 02/01/2010   a.) Doppler 93/81/0175: 10% LICA and 25% RICA. b.) Doppler 85/27/7824: >23% LICA and 53% RICA. c.) Doppler 10/25/16: >70% Bilater ICAs   Chronic bilateral low back pain with left-sided sciatica    CKD (chronic kidney disease), stage III ([place])    Coronary artery disease    a.) LHC 12/24/2014 -> small LAD system; diffuse CTO of LAD. 60% RCA. LM and LCx with no sig disease. no intervention; med mgmt.   Degenerative arthritis    Diastolic dysfunction    a.) TTE 09/20/2018: EF 55%, G1DD, mild LAE and RVE, triv-mild panvalvular regurgitation.   Dysarthria    GERD (gastroesophageal reflux disease)    Hepatic steatosis    History of kidney stones    HLD (hyperlipidemia)    Hypertension    Kidney stone    Lumbar radiculopathy    Major depression in remission ([place])    PVD (peripheral vascular disease) ([place])    Renal cyst, right 06/02/2015   a.) CT 06/02/2015 --> 4.4 cm RIGHT renal mass; MRI recommended. b.) MRI 06/16/2015 --> 3.8 x 4.2 x 4.0 cm proteinaceous/hemmorhagic cyst.   Subclavian steal syndrome    a.) LHC 12/24/2014 --> tight eccentric  90% ostial stenosis with damping.   T2DM (type 2 diabetes mellitus) [facility])    Past Surgical History:  Procedure Laterality Date   ABDOMINAL HYSTERECTOMY     AUGMENTATION MAMMAPLASTY Bilateral 1980   BACK SURGERY  09/2018   BICEPT TENODESIS  06/10/2021   Procedure: BICEPS TENODESIS;  Surgeon: Corky Mull, MD;  Location: [facility];  Service: Orthopedics;;   CARDIAC CATHETERIZATION Left 12/24/2014   Procedure: LEFT HEARD CATHETERIZATION; Location: [facility]; Surgeon: Laurence Aly, MD   CHOLECYSTECTOMY     COLONOSCOPY N/A 02/11/2016   Procedure: COLONOSCOPY;  Surgeon: Manya Silvas, MD;  Location: [facility] ENDOSCOPY;  Service: Endoscopy;  Laterality: N/A;   IR CT HEAD LTD  11/16/2021   IR CT HEAD LTD  11/16/2021   IR INTRAVSC STENT CERV CAROTID W/O EMB-PROT MOD SED INC ANGIO  11/18/2021   IR PERCUTANEOUS ART THROMBECTOMY/INFUSION INTRACRANIAL INC DIAG ANGIO  11/16/2021   RADIOLOGY WITH ANESTHESIA N/A 11/16/2021   Procedure: IR WITH ANESTHESIA- CODE STROKE;  Surgeon: Radiologist, Medication, MD;  Location: [employer];  Service: Radiology;  Laterality: N/A;   REVERSE SHOULDER ARTHROPLASTY Right 06/10/2021   Procedure: REVERSE SHOULDER ARTHROPLASTY;  Surgeon: Corky Mull, MD;  Location: [facility];  Service: Orthopedics;  Laterality: Right;  Patient Active Problem List   Diagnosis Date Noted   Acute ischemic left MCA stroke ([place]) 11/16/2021   Middle cerebral artery embolism, 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   Status post reverse arthroplasty of shoulder, right 06/10/2021    REFERRING DIAG: Q67.619 (ICD-10-CM) - Cerebral infarction due to unspecified occlusion or stenosis of left middle cerebral artery  THERAPY DIAG:  Muscle weakness (generalized)  Other lack of coordination  Unsteadiness on feet  Aphasia  Difficulty in walking, not elsewhere classified  Abnormality of gait and mobility  Acute ischemic left MCA stroke (HCC)  Dysarthria and anarthria  Other abnormalities of gait and mobility  Repeated  falls  Rationale for Evaluation and Treatment Rehabilitation  PERTINENT HISTORY: Pt Recent L MCA, s/p thrombectomy and ICA stenting. Pt with speech deficits. Pt has been somewhat unsteady with a few falls falling backward however DTR reports this is not new, but has been ongoing. 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  PRECAUTIONS: N/A  SUBJECTIVE: Pt states she is doing okay today. Denies falls/LOB since last session. Pt reports compliance with HEP.   PAIN:  Are you having pain? No     TODAY'S TREATMENT:  INTERVENTIONS:    BP= 163/70 mmHg Right UE in sitting at rest   Therapeutic Exercises:    -Standing forward toe tap onto 1st step at stairs with BUE UE support; x20 reps alt LE  -progressed to SUE support, x20 alternating  -6" step up to 1st step at stairs, BUE support, x10 reps each LE  -progressed to SUE support, x10 each LE   -STS from standard height chair, no UE support, 2x10    -Standing calf raises (feet on 1/2 foam roll) x 15 reps       Neuromuscular re-ed:    -lateral stepping over 6" hurdle, BUE support, x10 BLE  - static stand on 1/2 foam roll (curve side up) - Attempted several trials- 30 sec at max - more difficulty with right LE in rear.    - staggered stand with eyes open 2 x 60 sec hold x each LE. Increased unsteadiness in near tandem standing position.    Education provided throughout session via VC/TC and demonstration to facilitate movement at target joints and correct muscle activation for all testing and exercises performed.      PATIENT EDUCATION: Education details: POC, Economist Person educated: Patient Education method: Explanation, Demonstration, Tactile cues, and Verbal cues Education comprehension: verbalized understanding, returned demonstration, verbal cues required, tactile cues required, and needs further education   HOME EXERCISE PROGRAM: Access Code: [email or web address]      PT Short Term Goals       PT SHORT TERM GOAL #1   Title Independence in HEP for improved balance and strength    Baseline Issued at eval    Time 3    Period Weeks    Status New    Target Date 01/11/22      PT SHORT TERM GOAL #2   Title 5xSTS hands free <19sec    Baseline Eval: >24sec hands free    Time 4    Period Weeks    Status New    Target Date 01/18/22      PT SHORT TERM GOAL #3   Title Ankle DF MMT 5/5 bilat.    Baseline Eval: 4+/5 Right, 4/5 Left    Time 4    Period Weeks    Status New  Target Date 01/18/22              PT Long Term Goals       PT LONG TERM GOAL #1   Title 6MWT > 1252f c LRAD    Baseline July 2019: 13278f   Time 8    Period Weeks    Status New    Target Date 02/15/22      PT LONG TERM GOAL #2   Title 5xSTS <13 sec hands free    Baseline eval: >24sec hands free    Time 8    Period Weeks    Status New    Target Date 02/15/22      PT LONG TERM GOAL #3   Title Pt to demonstrate retroAMB without device >0.2542ms LOB to demonstrate improved ankle righting and control.    Baseline Eval: <0.34m28mnd c 3 LOB    Time 10    Period Weeks    Status New    Target Date 03/01/22      PT LONG TERM GOAL #4   Title FOTO Score increase >15    Baseline eval: 48    Time 12    Period Weeks    Status New    Target Date 03/15/22              Plan     Clinical Impression Statement Pt is pleasant and motivated in session. She performed step ups and taps with minimal challenge using BUE support. PT challenged pt with SUE support; she was unable to perform with no UE support due to lack of confidence in SLS. Occasionally, multiple attempts were required to perform STS w/o UE support. Performance including eccentric control improved with cueing on foot placement. Pt did fatigue in session, RLE>LLE, requiring more frequent seated rest breaks in later half of session. Pt will beneift from continued skilled PT interventions to address  impairments in order to maximize independence and safety in ADL/IADL and to reduce risk of falls.     Personal Factors and Comorbidities Past/Current Experience    Examination-Activity Limitations Dressing;Transfers;Bend;Locomotion Level;Stairs;Stand    Examination-Participation Restrictions Cleaning;Shop;Meal Prep;YardValla Leaverk;Laundry    Stability/Clinical Decision Making Evolving/Moderate complexity    Rehab Potential Good    Clinical Impairments Affecting Rehab Potential (+) motivation, social support (-) age, chronicity of pain, fairly sedentary lifestyle    PT Frequency 2x / week    PT Duration 12 weeks    PT Treatment/Interventions Aquatic Therapy;Moist Heat;Functional mobility training;Dry needling;Neuromuscular re-education;Traction;Ultrasound;Iontophoresis '4mg'$ /ml Dexamethasone;ADLs/Self Care Home Management;Cryotherapy;Electrical Stimulation;Therapeutic exercise;Therapeutic activities;Patient/family education;Taping;Gait training;Stair training;DME Instruction;Balance training;Passive range of motion    PT Next Visit Plan Progress safety with mobility and LE strengthening as appropriate.    PT Home Exercise Plan Eval: seated heel raises, toe raises, and STS from elevated surface. 01/04/2022=Access Code: [email or web address]: [email or web address]    Consulted and Agree with Plan of Care Patient;Family member/caregiver    Family Member Consulted EarnVirgilio Belling DPT

## 2022-01-31 ENCOUNTER — Encounter: Payer: Medicare Other | Admitting: Speech Pathology

## 2022-01-31 ENCOUNTER — Ambulatory Visit: Payer: Medicare Other

## 2022-01-31 ENCOUNTER — Encounter: Payer: Medicare Other | Admitting: Occupational Therapy

## 2022-02-01 ENCOUNTER — Ambulatory Visit: Payer: Medicare Other

## 2022-02-01 ENCOUNTER — Ambulatory Visit: Payer: Medicare Other | Admitting: Speech Pathology

## 2022-02-01 DIAGNOSIS — R278 Other lack of coordination: Secondary | ICD-10-CM

## 2022-02-01 DIAGNOSIS — R471 Dysarthria and anarthria: Secondary | ICD-10-CM

## 2022-02-01 DIAGNOSIS — R2681 Unsteadiness on feet: Secondary | ICD-10-CM

## 2022-02-01 DIAGNOSIS — R4701 Aphasia: Secondary | ICD-10-CM | POA: Diagnosis not present

## 2022-02-01 DIAGNOSIS — M6281 Muscle weakness (generalized): Secondary | ICD-10-CM

## 2022-02-01 NOTE — Therapy (Signed)
OUTPATIENT SPEECH LANGUAGE PATHOLOGY TREATMENT NOTE   Patient Name: Cassidy Hernandez MRN: [id number] DOB:[birth year], 81 y.o., female Today's Date: 02/01/2022   REFERRING PROVIDER: Merrilee Seashore, NP    End of Session - 02/01/22 1121     Visit Number 13    Number of Visits 25    Date for SLP Re-Evaluation 03/16/22    Authorization Type BCBS MCR; $10 copay    Authorization - Visit Number 3    Progress Note Due on Visit 10    SLP Start Time 1000    SLP Stop Time  1100    SLP Time Calculation (min) 60 min    Activity Tolerance Patient tolerated treatment well             Past Medical History:  Diagnosis Date   Anginal pain ([place])    Aortic atherosclerosis    Cancer ([place])    Carotid artery stenosis 02/01/2010   a.) Doppler 43/32/9518: 84% LICA and 16% RICA. b.) Doppler 60/63/0160: >10% LICA and 93% RICA. c.) Doppler 10/25/16: >70% Bilater ICAs   Chronic bilateral low back pain with left-sided sciatica    CKD (chronic kidney disease), stage III (HCC)    Coronary artery disease    a.) LHC 12/24/2014 -> small LAD system; diffuse CTO of LAD. 60% RCA. LM and LCx with no sig disease. no intervention; med mgmt.   Degenerative arthritis    Diastolic dysfunction    a.) TTE 09/20/2018: EF 55%, G1DD, mild LAE and RVE, triv-mild panvalvular regurgitation.   Dysarthria    GERD (gastroesophageal reflux disease)    Hepatic steatosis    History of kidney stones    HLD (hyperlipidemia)    Hypertension    Kidney stone    Lumbar radiculopathy    Major depression in remission ([place])    PVD (peripheral vascular disease) ([place])    Renal cyst, right 06/02/2015   a.) CT 06/02/2015 --> 4.4 cm RIGHT renal mass; MRI recommended. b.) MRI 06/16/2015 --> 3.8 x 4.2 x 4.0 cm proteinaceous/hemmorhagic cyst.   Subclavian steal syndrome    a.) LHC 12/24/2014 --> tight eccentric 90% ostial stenosis with damping.   T2DM (type 2 diabetes mellitus) [facility])    Past Surgical History:  Procedure Laterality Date    ABDOMINAL HYSTERECTOMY     AUGMENTATION MAMMAPLASTY Bilateral 1980   BACK SURGERY  09/2018   BICEPT TENODESIS  06/10/2021   Procedure: BICEPS TENODESIS;  Surgeon: Corky Mull, MD;  Location: [facility];  Service: Orthopedics;;   CARDIAC CATHETERIZATION Left 12/24/2014   Procedure: LEFT HEARD CATHETERIZATION; Location: [facility]; Surgeon: Laurence Aly, MD   CHOLECYSTECTOMY     COLONOSCOPY N/A 02/11/2016   Procedure: COLONOSCOPY;  Surgeon: Manya Silvas, MD;  [facility] END[facility]COPY;  Service: Endoscopy;  Laterality: N/A;   IR CT HEAD LTD  11/16/2021   IR CT HEAD LTD  11/16/2021   IR INTRAVSC STENT CERV CAROTID W/O EMB-PROT MOD SED INC ANGIO  11/18/2021   IR PERCUTANEOUS ART THROMBECTOMY/INFUSION INTRACRANIAL INC DIAG ANGIO  11/16/2021   RADIOLOGY WITH ANESTHESIA N/A 11/16/2021   Procedure: IR WITH ANESTHESIA- CODE STROKE;  Surgeon: Radiologist, Medication, MD;  Location: [place];  Service: Radiology;  Laterality: N/A;   REVERSE SHOULDER ARTHROPLASTY Right 06/10/2021   Procedure: REVERSE SHOULDER ARTHROPLASTY;  Surgeon: Corky Mull, MD;  Location: [facility];  Service: Orthopedics;  Laterality: Right;   Patient Active Problem List   Diagnosis Date Noted   Acute ischemic left MCA stroke ([place]) 11/16/2021  Middle cerebral artery embolism, 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   Status post reverse arthroplasty of shoulder, right 06/10/2021    ONSET DATE: 11/16/21   REFERRING DIAG: L MCA CVA   HPI: Pt is a 81 y.o. 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 Patient is s/p thrombectomy with left ICA stent placement 11/16/21. She completed inpatient rehabilitation at [facility] 11/25/21-12/07/21 and was discharged home with family. Patient known to this clinic from prior course of voice therapy in 2018. Pt with documented hx of voice and speech changes (dysarthria) per videostroboscopy 12/26/17; did not attend therapy.   THERAPY DIAG:  Aphasia  Dysarthria and anarthria  Rationale for Evaluation and  Treatment Rehabilitation  SUBJECTIVE: "I want to hear what you've been doing."  Pt accompanied by: caregiver Ernestine  PAIN: No Are you having pain? No     OBJECTIVE:   TODAY'S TREATMENT: Clinician targeted communication compensations for instances of anomia, dysarthria, paraphasia etc. 4x in 5 min conversation with min to mod cues. Reading comprehension was targeted with aphasia education handouts; Pt read 2 simple paragraphs and aphasia-friendly printed materials (phrase/sentence level) summarizing information with min cues. Patient stated she didn't understand why her speech changed; clinician used aphasia-friendly education materials and supported conversation techniques to facilitate pt understanding of aphasia, stroke recovery and neuroplasticity. Emphasized importance of engaging in challenging tasks daily to promote language stimulation and recovery.  PATIENT EDUCATION: Education details: Stroke and impact of stroke on language impairment, neuroplasticity Person educated: Patient Education method: Explanation Education comprehension: verbalized understanding and needs further education   SLP Short Term Goals - 01/24/22 1439       SLP SHORT TERM GOAL #1   Title Pt will communicate emergency information 100% accuracy using visual aid if necessary.    Baseline 100% 01/24/22    Time 6    Period Weeks    Status Achieved    Target Date 01/27/22      SLP SHORT TERM GOAL #2   Title Pt will generate at least 4 descriptors of target word 80% of the time using semantic feature analysis to improve abilities in wordfinding and resolving communication breakdowns.    Baseline 85% 01/24/22    Time 6    Period Weeks    Status Achieved    Target Date 01/27/22      SLP SHORT TERM GOAL #3   Title Patient will request repetition/rephrasing to aid auditory comprehension of mod complex information (multi-step commands, conversation) in 80% of opportunities.    Baseline 85% 01/24/22     Time 6    Period Weeks    Status Achieved    Target Date 01/27/22      SLP SHORT TERM GOAL #4   Title Pt will read and reply to simple texts/emails 80% accuracy with use of accessibility features as necessary.    Baseline 01/24/22, extended time, requires occasional cues, continues to require cuing with accessibility features    Time 6    Period Weeks    Status Partially Met    Target Date 01/27/22              SLP Long Term Goals - 01/24/22 1440       SLP LONG TERM GOAL #1   Title Pt will engage in 5-8 minutes simple-mod complex conversation re: topic of interest with supported conversation, aphasia compensations.    Time 12    Period Weeks    Status On-going      SLP  LONG TERM GOAL #2   Title Pt will ID and attempt repair of communication breakdowns >80% of the time in session.    Time 12    Period Weeks    Status On-going      SLP LONG TERM GOAL #3   Title Pt will demonstrate reading comprehension of mod complex 8-10 paragraphs by generating summary or answering questions >80% accuracy.    Time 12    Period Weeks    Status On-going              Plan - 02/01/22 1121     Clinical Impression Statement Pt continues to present with moderate aphasia, with expressive deficits greater than receptive deficits. Pt demonstrates improvement in reading comprehension by summarizing information in her own words. Pt benefitted from multimodal techniques including visual aids, written keyword, and supportive conversation techniques to improve her comprehension of stroke, aphasia, and recovery. Plan for next time to introduce script training and create visual aids for family events (i.e. wedding). Pt continues progressing toward goals and benefits from continued skilled ST intervention per plan of care.   Speech Therapy Frequency 2x / week    Duration 2 weeks    Treatment/Interventions SLP instruction and feedback;Multimodal communcation approach;Patient/family education;Functional  tasks;Cueing hierarchy;Compensatory techniques;Compensatory strategies    Potential to Achieve Goals Good    Potential Considerations Family/community support    SLP Home Exercise Plan [facility] Therapy sign-up and tasks provided last session.    Consulted and Agree with Plan of Care Patient              Deneise Lever, [place], CCC-SLP Speech-Language Pathologist [phone]  Aliene Altes, [place] 02/01/2022, 11:23 AM

## 2022-02-02 ENCOUNTER — Ambulatory Visit: Payer: Medicare Other

## 2022-02-02 ENCOUNTER — Ambulatory Visit: Payer: Medicare Other | Admitting: Speech Pathology

## 2022-02-02 ENCOUNTER — Encounter: Payer: Medicare Other | Admitting: Occupational Therapy

## 2022-02-02 DIAGNOSIS — R269 Unspecified abnormalities of gait and mobility: Secondary | ICD-10-CM

## 2022-02-02 DIAGNOSIS — R471 Dysarthria and anarthria: Secondary | ICD-10-CM

## 2022-02-02 DIAGNOSIS — I63512 Cerebral infarction due to unspecified occlusion or stenosis of left middle cerebral artery: Secondary | ICD-10-CM

## 2022-02-02 DIAGNOSIS — M6281 Muscle weakness (generalized): Secondary | ICD-10-CM

## 2022-02-02 DIAGNOSIS — R262 Difficulty in walking, not elsewhere classified: Secondary | ICD-10-CM

## 2022-02-02 DIAGNOSIS — R4701 Aphasia: Secondary | ICD-10-CM | POA: Diagnosis not present

## 2022-02-02 DIAGNOSIS — R278 Other lack of coordination: Secondary | ICD-10-CM

## 2022-02-02 DIAGNOSIS — R2681 Unsteadiness on feet: Secondary | ICD-10-CM

## 2022-02-02 NOTE — Therapy (Signed)
OUTPATIENT PHYSICAL THERAPY TREATMENT NOTE Physical Therapy Progress Note   Dates of reporting period  12/21/21   to   02/02/22    Patient Name: Cassidy Hernandez MRN: [id number] DOB:13-Apr-1941, 81 y.o., female Today's Date: 02/02/2022  PCP: Kirk Ruths, MD REFERRING PROVIDER: Rayann Heman, NP   PT End of Session - 02/02/22 1303     Visit Number 10    Number of Visits 24    Date for PT Re-Evaluation 03/15/22    Authorization Type BCBS Medicare    Authorization Time Period 12/21/21-03/15/22    Progress Note Due on Visit 10    PT Start Time 1300    PT Stop Time 1340    PT Time Calculation (min) 40 min    Equipment Utilized During Treatment Gait belt    Activity Tolerance Patient tolerated treatment well    Behavior During Therapy [facility] for tasks assessed/performed             Past Medical History:  Diagnosis Date   Anginal pain ([place])    Aortic atherosclerosis    Cancer ([place])    Carotid artery stenosis 02/01/2010   a.) Doppler 27/01/2375: 28% LICA and 31% RICA. b.) Doppler 51/76/1607: >37% LICA and 10% RICA. c.) Doppler 10/25/16: >70% Bilater ICAs   Chronic bilateral low back pain with left-sided sciatica    CKD (chronic kidney disease), stage III ([place])    Coronary artery disease    a.) LHC 12/24/2014 -> small LAD system; diffuse CTO of LAD. 60% RCA. LM and LCx with no sig disease. no intervention; med mgmt.   Degenerative arthritis    Diastolic dysfunction    a.) TTE 09/20/2018: EF 55%, G1DD, mild LAE and RVE, triv-mild panvalvular regurgitation.   Dysarthria    GERD (gastroesophageal reflux disease)    Hepatic steatosis    History of kidney stones    HLD (hyperlipidemia)    Hypertension    Kidney stone    Lumbar radiculopathy    Major depression in remission ([place])    PVD (peripheral vascular disease) ([place])    Renal cyst, right 06/02/2015   a.) CT 06/02/2015 --> 4.4 cm RIGHT renal mass; MRI recommended. b.) MRI 06/16/2015 --> 3.8 x 4.2 x 4.0 cm  proteinaceous/hemmorhagic cyst.   Subclavian steal syndrome    a.) LHC 12/24/2014 --> tight eccentric 90% ostial stenosis with damping.   T2DM (type 2 diabetes mellitus) [facility])    Past Surgical History:  Procedure Laterality Date   ABDOMINAL HYSTERECTOMY     AUGMENTATION MAMMAPLASTY Bilateral 1980   BACK SURGERY  09/2018   BICEPT TENODESIS  06/10/2021   Procedure: BICEPS TENODESIS;  Surgeon: Corky Mull, MD;  Location: [facility];  Service: Orthopedics;;   CARDIAC CATHETERIZATION Left 12/24/2014   Procedure: LEFT HEARD CATHETERIZATION; Location: [facility]; Surgeon: Laurence Aly, MD   CHOLECYSTECTOMY     COLONOSCOPY N/A 02/11/2016   Procedure: COLONOSCOPY;  Surgeon: Manya Silvas, MD;  Location: [facility] ENDOSCOPY;  Service: Endoscopy;  Laterality: N/A;   IR CT HEAD LTD  11/16/2021   IR CT HEAD LTD  11/16/2021   IR INTRAVSC STENT CERV CAROTID W/O EMB-PROT MOD SED INC ANGIO  11/18/2021   IR PERCUTANEOUS ART THROMBECTOMY/INFUSION INTRACRANIAL INC DIAG ANGIO  11/16/2021   RADIOLOGY WITH ANESTHESIA N/A 11/16/2021   Procedure: IR WITH ANESTHESIA- CODE STROKE;  Surgeon: Radiologist, Medication, MD;  Location: [facility];  Service: Radiology;  Laterality: N/A;   REVERSE SHOULDER ARTHROPLASTY Right 06/10/2021   Procedure: REVERSE  SHOULDER ARTHROPLASTY;  Surgeon: Corky Mull, MD;  Location: [facility];  Service: Orthopedics;  Laterality: Right;   Patient Active Problem List   Diagnosis Date Noted   Acute ischemic left MCA stroke ([place]) 11/16/2021   Middle cerebral artery embolism, 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   Status post reverse arthroplasty of shoulder, right 06/10/2021    REFERRING DIAG: W73.710 (ICD-10-CM) - Cerebral infarction due to unspecified occlusion or stenosis of left middle cerebral artery  THERAPY DIAG:  Muscle weakness (generalized)  Other lack of coordination  Unsteadiness on feet  Difficulty in walking, not elsewhere classified  Abnormality of gait and mobility  Acute ischemic left MCA stroke  ([place])  Rationale for Evaluation and Treatment Rehabilitation  PERTINENT HISTORY: Pt Recent L MCA, s/p thrombectomy and ICA stenting. Pt with speech deficits. Pt has been somewhat unsteady with a few falls falling backward however DTR reports this is not new, but has been ongoing. 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  PRECAUTIONS: N/A     02/02/22 1302  Symptoms/Limitations  Subjective Pt says she's doing well in general, starting to feel some progress with balance at home from PT. Pt saw orthopedist for xray of Right shoulder, a good visit overall.  Pertinent History Pt Recent L MCA, s/p thrombectomy and ICA stenting. Pt with speech deficits. Pt has been somewhat unsteady with a few falls falling backward however DTR reports this is not new, but has been ongoing. Pt seen at our [facility] clinic in 2018 fo rlow back pain issues in the setting of spintal stenosis.  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 No/denies      TODAY'S TREATMENT:  INTERVENTIONS:   -Retest of 6MWT, 5xSTS, retro AMB, FOTO survery  -STS from chair + folded towel 3x10, needs cues for getting feet under chair, then has no LOB -semit tandem stance 3x15sec bilat        PATIENT EDUCATION: Education details: POC, body mechanics Person educated: Patient Education method: Explanation, Demonstration, Tactile cues, and Verbal cues Education comprehension: verbalized understanding, returned demonstration, verbal cues required, tactile cues required, and needs further education   HOME EXERCISE PROGRAM: Access Code: [email or web address]     PT Short Term Goals - 02/02/22  1307       PT SHORT TERM GOAL #1   Title Independence in HEP for improved balance and strength    Baseline Issued at eval    Time 3    Period Weeks    Status Achieved    Target Date 01/11/22      PT SHORT TERM GOAL #2   Title 5xSTS hands free <19sec    Baseline Eval: >24sec hands free; 02/02/22: 22.49sec hands free (1 LOB)    Time 4    Period Weeks    Status On-going    Target Date 01/18/22      PT SHORT TERM GOAL #3   Title Ankle DF MMT 5/5 bilat.    Baseline Eval: 4+/5 Right, 4/5 Left; 02/02/22: Rt 5-/5, Lt 5/5    Time 4    Period Weeks    Status Achieved    Target Date 01/18/22  PT Long Term Goals - 02/02/22 1310       PT LONG TERM GOAL #1   Title 6MWT > 1236f c LRAD;    Baseline July 2019: 13277f 02/02/22: 6906f 4WW, no LOB CGA    Time 8    Period Weeks    Status On-going    Target Date 02/15/22      PT LONG TERM GOAL #2   Title 5xSTS <13 sec hands free    Baseline eval: >24sec hands free; 02/02/22: 22sec hands free    Time 8    Period Weeks    Status On-going    Target Date 02/15/22      PT LONG TERM GOAL #3   Title Pt to demonstrate retroAMB without device >0.16m53m LOB to demonstrate improved ankle righting and control.    Baseline Eval: <0.61m/52md c 3 LOB; 02/02/22: 0.68m/s17mTime 10    Period Weeks    Status On-going    Target Date 03/01/22      PT LONG TERM GOAL #4   Title FOTO Score increase >15    Baseline eval: 48; 02/02/22: 62    Time 12    Period Weeks    Status On-going    Target Date 03/15/22                Plan - 02/02/22 1334     Clinical Impression Statement Reassessment today on visit 10. Excellent progression toward goals on retro AMB speed, MMT in ankles, and FOTO survery. Pt has not made as much progress in 5xSTS and 6MWT, will need special focus in coming sessions. Pt feels she is making some progress at home regarding balance.    Personal Factors and Comorbidities Past/Current Experience     Examination-Activity Limitations Dressing;Transfers;Bend;Locomotion Level;Stairs;Stand    Examination-Participation Restrictions Cleaning;Shop;Meal Prep;Yard [place] Clinicability/Clinical Decision Making Evolving/Moderate complexity    Clinical Decision Making Moderate    Rehab Potential Good    Clinical Impairments Affecting Rehab Potential (+) motivation, social support (-) age, chronicity of pain, fairly sedentary lifestyle    PT Frequency 2x / week    PT Duration 12 weeks    PT Treatment/Interventions Aquatic Therapy;Moist Heat;Functional mobility training;Dry needling;Neuromuscular re-education;Traction;Ultrasound;Iontophoresis '4mg'$ /ml Dexamethasone;ADLs/Self Care Home Management;Cryotherapy;Electrical Stimulation;Therapeutic exercise;Therapeutic activities;Patient/family education;Taping;Gait training;Stair training;DME Instruction;Balance training;Passive range of motion    PT Next Visit Plan continue with interventions to further progress toward goals of treatment    PT Home Exercise Plan Eval: seated heel raises, toe raises, and STS from elevated surface. 01/04/2022=Access Code: [email or web address]    Consulted and Agree with Plan of Care Patient;Family member/caregiver               1:43 PM, 02/02/22 Tiasia Weberg Etta GrandchildDPT Physical Therapist - [phone]

## 2022-02-03 ENCOUNTER — Ambulatory Visit: Payer: Medicare Other | Admitting: Occupational Therapy

## 2022-02-03 NOTE — Therapy (Signed)
OUTPATIENT SPEECH LANGUAGE PATHOLOGY TREATMENT NOTE   Patient Name: Cassidy Hernandez MRN: [id number] DOB:[birth year], 81 y.o., female Today's Date: 02/03/2022   REFERRING PROVIDER: Merrilee Seashore, NP    End of Session - 02/02/22 1110     Visit Number 14    Number of Visits 25    Date for SLP Re-Evaluation 03/16/22    Authorization Type BCBS MCR; $10 copay    Authorization - Visit Number 4    Progress Note Due on Visit 10    SLP Start Time 1108    SLP Stop Time  1200    SLP Time Calculation (min) 52 min    Activity Tolerance Patient tolerated treatment well             Past Medical History:  Diagnosis Date   Anginal pain ([place])    Aortic atherosclerosis    Cancer ([place])    Carotid artery stenosis 02/01/2010   a.) Doppler 85/46/2703: 50% LICA and 09% RICA. b.) Doppler 38/18/2993: >71% LICA and 69% RICA. c.) Doppler 10/25/16: >70% Bilater ICAs   Chronic bilateral low back pain with left-sided sciatica    CKD (chronic kidney disease), stage III ([facility])    Coronary artery disease    a.) LHC 12/24/2014 -> small LAD system; diffuse CTO of LAD. 60% RCA. LM and LCx with no sig disease. no intervention; med mgmt.   Degenerative arthritis    Diastolic dysfunction    a.) TTE 09/20/2018: EF 55%, G1DD, mild LAE and RVE, triv-mild panvalvular regurgitation.   Dysarthria    GERD (gastroesophageal reflux disease)    Hepatic steatosis    History of kidney stones    HLD (hyperlipidemia)    Hypertension    Kidney stone    Lumbar radiculopathy    Major depression in remission ([place])    PVD (peripheral vascular disease) ([place])    Renal cyst, right 06/02/2015   a.) CT 06/02/2015 --> 4.4 cm RIGHT renal mass; MRI recommended. b.) MRI 06/16/2015 --> 3.8 x 4.2 x 4.0 cm proteinaceous/hemmorhagic cyst.   Subclavian steal syndrome    a.) LHC 12/24/2014 --> tight eccentric 90% ostial stenosis with damping.   T2DM (type 2 diabetes mellitus) [facility])    Past Surgical History:  Procedure Laterality Date    ABDOMINAL HYSTERECTOMY     AUGMENTATION MAMMAPLASTY Bilateral 1980   BACK SURGERY  09/2018   BICEPT TENODESIS  06/10/2021   Procedure: BICEPS TENODESIS;  Surgeon: Corky Mull, MD;  Location: [facility];  Service: Orthopedics;;   CARDIAC CATHETERIZATION Left 12/24/2014   Procedure: LEFT HEARD CATHETERIZATION; Location: [facility]; Surgeon: Laurence Aly, MD   CHOLECYSTECTOMY     COLONOSCOPY N/A 02/11/2016   Procedure: COLONOSCOPY;  Surgeon: Manya Silvas, MD;  Location: [facility] ENDOSCOPY;  Service: Endoscopy;  Laterality: N/A;   IR CT HEAD LTD  11/16/2021   IR CT HEAD LTD  11/16/2021   IR INTRAVSC STENT CERV CAROTID W/O EMB-PROT MOD SED INC ANGIO  11/18/2021   IR PERCUTANEOUS ART THROMBECTOMY/INFUSION INTRACRANIAL INC DIAG ANGIO  11/16/2021   RADIOLOGY WITH ANESTHESIA N/A 11/16/2021   Procedure: IR WITH ANESTHESIA- CODE STROKE;  Surgeon: Radiologist, Medication, MD;  Location: [facility];  Service: Radiology;  Laterality: N/A;   REVERSE SHOULDER ARTHROPLASTY Right 06/10/2021   Procedure: REVERSE SHOULDER ARTHROPLASTY;  Surgeon: Corky Mull, MD;  Location: [facility];  Service: Orthopedics;  Laterality: Right;   Patient Active Problem List   Diagnosis Date Noted   Acute ischemic left MCA stroke ([place]) 11/16/2021  Middle cerebral artery embolism, 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   Status post reverse arthroplasty of shoulder, right 06/10/2021    ONSET DATE: 11/16/21   REFERRING DIAG: L MCA CVA   HPI: Pt is a 81 y.o. 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 Patient is s/p thrombectomy with left ICA stent placement 11/16/21. She completed inpatient rehabilitation at [facility] 11/25/21-12/07/21 and was discharged home with family. Patient known to this clinic from prior course of voice therapy in 2018. Pt with documented hx of voice and speech changes (dysarthria) per videostroboscopy 12/26/17; did not attend therapy.   THERAPY DIAG:  Aphasia  Dysarthria and anarthria  Rationale for Evaluation and  Treatment Rehabilitation  SUBJECTIVE: Pt brought iPad today  Pt accompanied by: sister Rod Holler  PAIN: No Are you having pain? No     OBJECTIVE:   TODAY'S TREATMENT: Probation officer and SLP targeted simple conversation providing conversational supports including extra time, semantic cuing, and written key words. Patient made attempts to correct breakdowns consistently, however required occasional min cues to slow rate. Patient independently used visual supports (photos from her phone) to add context and detail to conversation about a wedding her family attended. Patient expressed hesitation about her readiness to communicate at her granddaughter's wedding in October. SLP introduced concept of script training and use of visual supports to increase her confidence and communication participation. Patient began writing list of keywords (family members and the cities where they live) with occasional mod cues. Pt generated 5 names and 3 cities. Mod cues required for anomia (reception). Pt composed text to her daughter to request name of the hotel she is staying in, 100% acc. Installed apps for home practice on pt's iPad (TalkPath therapy and news). Demonstrated accessing tasks for home practice; pt returned demonstration with occasional min cues. Pt selected sentences to describe pictures from F:2 82% acc. Named common objects 100% acc.   PATIENT EDUCATION: Education details: use of visual aids and scripting to increase participation and support communication Person educated: Patient Education method: Explanation Education comprehension: verbalized understanding and needs further education   SLP Short Term Goals - 01/24/22 1439       SLP SHORT TERM GOAL #1   Title Pt will communicate emergency information 100% accuracy using visual aid if necessary.    Baseline 100% 01/24/22    Time 6    Period Weeks    Status Achieved    Target Date 01/27/22      SLP SHORT TERM GOAL #2   Title Pt will  generate at least 4 descriptors of target word 80% of the time using semantic feature analysis to improve abilities in wordfinding and resolving communication breakdowns.    Baseline 85% 01/24/22    Time 6    Period Weeks    Status Achieved    Target Date 01/27/22      SLP SHORT TERM GOAL #3   Title Patient will request repetition/rephrasing to aid auditory comprehension of mod complex information (multi-step commands, conversation) in 80% of opportunities.    Baseline 85% 01/24/22    Time 6    Period Weeks    Status Achieved    Target Date 01/27/22      SLP SHORT TERM GOAL #4   Title Pt will read and reply to simple texts/emails 80% accuracy with use of accessibility features as necessary.    Baseline 01/24/22, extended time, requires occasional cues, continues to require cuing with accessibility features    Time 6  Period Weeks    Status Partially Met    Target Date 01/27/22              SLP Long Term Goals - 01/24/22 1440       SLP LONG TERM GOAL #1   Title Pt will engage in 5-8 minutes simple-mod complex conversation re: topic of interest with supported conversation, aphasia compensations.    Time 12    Period Weeks    Status On-going      SLP LONG TERM GOAL #2   Title Pt will ID and attempt repair of communication breakdowns >80% of the time in session.    Time 12    Period Weeks    Status On-going      SLP LONG TERM GOAL #3   Title Pt will demonstrate reading comprehension of mod complex 8-10 paragraphs by generating summary or answering questions >80% accuracy.    Time 12    Period Weeks    Status On-going              Plan - 02/01/22 1121     Clinical Impression Statement Pt continues to present with moderate aphasia, with expressive deficits greater than receptive deficits. She demonstrates improving error awareness and persistence in attempts to correct errors; benefits from slowing rate. Multimodal techniques including visual aids, written keyword,  and supportive conversation techniques remain effective to improve verbal expression and auditory comprehension for more complex topics. Will continue to utilize these methods to support language recovery, as well as use script training in preparation for upcoming family event (wedding). Pt continues progressing toward goals and benefits from continued skilled ST intervention per plan of care.   Speech Therapy Frequency 2x / week    Duration 2 weeks    Treatment/Interventions SLP instruction and feedback;Multimodal communcation approach;Patient/family education;Functional tasks;Cueing hierarchy;Compensatory techniques;Compensatory strategies    Potential to Achieve Goals Good    Potential Considerations Family/community support    SLP Home Exercise Plan Talk[employer] Therapy sign-up and tasks provided last session.    Consulted and Agree with Plan of Care Patient              Deneise Lever, [place], CCC-SLP Speech-Language Pathologist [phone]  Aliene Altes, [place] 02/03/2022, 8:56 AM

## 2022-02-07 ENCOUNTER — Encounter: Payer: Medicare Other | Admitting: Occupational Therapy

## 2022-02-07 ENCOUNTER — Ambulatory Visit: Payer: Medicare Other | Attending: Nurse Practitioner | Admitting: Speech Pathology

## 2022-02-07 DIAGNOSIS — M6281 Muscle weakness (generalized): Secondary | ICD-10-CM | POA: Diagnosis present

## 2022-02-07 DIAGNOSIS — R278 Other lack of coordination: Secondary | ICD-10-CM | POA: Diagnosis present

## 2022-02-07 DIAGNOSIS — R471 Dysarthria and anarthria: Secondary | ICD-10-CM | POA: Insufficient documentation

## 2022-02-07 DIAGNOSIS — R4701 Aphasia: Secondary | ICD-10-CM | POA: Diagnosis present

## 2022-02-07 DIAGNOSIS — R2681 Unsteadiness on feet: Secondary | ICD-10-CM | POA: Insufficient documentation

## 2022-02-07 DIAGNOSIS — R269 Unspecified abnormalities of gait and mobility: Secondary | ICD-10-CM | POA: Insufficient documentation

## 2022-02-07 DIAGNOSIS — R2689 Other abnormalities of gait and mobility: Secondary | ICD-10-CM | POA: Insufficient documentation

## 2022-02-07 DIAGNOSIS — R262 Difficulty in walking, not elsewhere classified: Secondary | ICD-10-CM | POA: Insufficient documentation

## 2022-02-07 NOTE — Therapy (Signed)
OUTPATIENT SPEECH LANGUAGE PATHOLOGY TREATMENT NOTE   Patient Name: Cassidy Hernandez MRN: [id number] DOB:[birth year], 81 y.o., female Today's Date: 02/07/2022   REFERRING PROVIDER: Merrilee Seashore, NP    End of Session - 02/07/22 1007     Visit Number 15    Number of Visits 25    Date for SLP Re-Evaluation 03/16/22    Authorization Type BCBS MCR; $10 copay    Authorization - Visit Number 5    Progress Note Due on Visit 10    SLP Start Time 1005    SLP Stop Time  1100    SLP Time Calculation (min) 55 min    Activity Tolerance Patient tolerated treatment well             Past Medical History:  Diagnosis Date   Anginal pain ([place])    Aortic atherosclerosis    Cancer ([place])    Carotid artery stenosis 02/01/2010   a.) Doppler 87/56/4332: 95% LICA and 18% RICA. b.) Doppler 84/16/6063: >01% LICA and 60% RICA. c.) Doppler 10/25/16: >70% Bilater ICAs   Chronic bilateral low back pain with left-sided sciatica    CKD (chronic kidney disease), stage III (HCC)    Coronary artery disease    a.) LHC 12/24/2014 -> small LAD system; diffuse CTO of LAD. 60% RCA. LM and LCx with no sig disease. no intervention; med mgmt.   Degenerative arthritis    Diastolic dysfunction    a.) TTE 09/20/2018: EF 55%, G1DD, mild LAE and RVE, triv-mild panvalvular regurgitation.   Dysarthria    GERD (gastroesophageal reflux disease)    Hepatic steatosis    History of kidney stones    HLD (hyperlipidemia)    Hypertension    Kidney stone    Lumbar radiculopathy    Major depression in remission ([place])    PVD (peripheral vascular disease) ([place])    Renal cyst, right 06/02/2015   a.) CT 06/02/2015 --> 4.4 cm RIGHT renal mass; MRI recommended. b.) MRI 06/16/2015 --> 3.8 x 4.2 x 4.0 cm proteinaceous/hemmorhagic cyst.   Subclavian steal syndrome    a.) LHC 12/24/2014 --> tight eccentric 90% ostial stenosis with damping.   T2DM (type 2 diabetes mellitus) [facility])    Past Surgical History:  Procedure Laterality Date    ABDOMINAL HYSTERECTOMY     AUGMENTATION MAMMAPLASTY Bilateral 1980   BACK SURGERY  09/2018   BICEPT TENODESIS  06/10/2021   Procedure: BICEPS TENODESIS;  Surgeon: Corky Mull, MD;  Location: [facility];  Service: Orthopedics;;   CARDIAC CATHETERIZATION Left 12/24/2014   Procedure: LEFT HEARD CATHETERIZATION; Location: [facility]; Surgeon: Laurence Aly, MD   CHOLECYSTECTOMY     COLONOSCOPY N/A 02/11/2016   Procedure: COLONOSCOPY;  Surgeon: Manya Silvas, MD;  Location: [facility] END[facility]COPY;  Service: Endoscopy;  Laterality: N/A;   IR CT HEAD LTD  11/16/2021   IR CT HEAD LTD  11/16/2021   IR INTRAVSC STENT CERV CAROTID W/O EMB-PROT MOD SED INC ANGIO  11/18/2021   IR PERCUTANEOUS ART THROMBECTOMY/INFUSION INTRACRANIAL INC DIAG ANGIO  11/16/2021   RADIOLOGY WITH ANESTHESIA N/A 11/16/2021   Procedure: IR WITH ANESTHESIA- CODE STROKE;  Surgeon: Radiologist, Medication, MD;  Location: [facility];  Service: Radiology;  Laterality: N/A;   REVERSE SHOULDER ARTHROPLASTY Right 06/10/2021   Procedure: REVERSE SHOULDER ARTHROPLASTY;  Surgeon: Corky Mull, MD;  Location: [facility];  Service: Orthopedics;  Laterality: Right;   Patient Active Problem List   Diagnosis Date Noted   Acute ischemic left MCA stroke ([place]) 11/16/2021  Middle cerebral artery embolism, 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   Status post reverse arthroplasty of shoulder, right 06/10/2021    ONSET DATE: 11/16/21   REFERRING DIAG: L MCA CVA   HPI: Pt is a 81 y.o. 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 Patient is s/p thrombectomy with left ICA stent placement 11/16/21. She completed inpatient rehabilitation at [facility] 11/25/21-12/07/21 and was discharged home with family. Patient known to this clinic from prior course of voice therapy in 2018. Pt with documented hx of voice and speech changes (dysarthria) per videostroboscopy 12/26/17; did not attend therapy.   THERAPY DIAG:  Aphasia  Rationale for Evaluation and Treatment  Rehabilitation  SUBJECTIVE: "Fill the... sentence."  Pt accompanied by: sister Cassidy Hernandez  PAIN: No Are you having pain? No     OBJECTIVE:   TODAY'S TREATMENT: SLP targeted simple conversation providing conversational supports such as extra time and semantic cueing. Patient made attempts to correct breakdowns consistently, however required occasional min cues to slow rate. Patient independently used visual aids and gestures to support communication (pictures from text messages). SLP continued targeting script training for Pt's upcoming family event (granddaughter's wedding). Pt generated locations and family/friends involved with the wedding with min cues. For more complex writing at phrase-sentence level, SLP prompted Pt to come up with potential questions family/friends would ask her, things Pt would like to share, and questions Pt would ask family/friends. Pt required mod cues for this; syntax errors in >50% of sentences, however pt demonstrated some attempts at self-correction. Facilitated error awareness/correction by having pt re-read sentences aloud (mod cues); pt independently re-read last sentence for errors. To target syntax/sentence generation, SLP used sentence scramble on pt's tablet. Pt required mod cues to read sentences out loud for error awareness; 2-word sentences 100%, 3-word sentences 90%, 4-word sentences 90%.   PATIENT EDUCATION: Education details: read aloud to ID errors in writing Person educated: Patient Education method: Explanation, Demonstration, and Verbal cues Education comprehension: verbalized understanding and needs further education   SLP Short Term Goals - 01/24/22 1439       SLP SHORT TERM GOAL #1   Title Pt will communicate emergency information 100% accuracy using visual aid if necessary.    Baseline 100% 01/24/22    Time 6    Period Weeks    Status Achieved    Target Date 01/27/22      SLP SHORT TERM GOAL #2   Title Pt will generate at least 4  descriptors of target word 80% of the time using semantic feature analysis to improve abilities in wordfinding and resolving communication breakdowns.    Baseline 85% 01/24/22    Time 6    Period Weeks    Status Achieved    Target Date 01/27/22      SLP SHORT TERM GOAL #3   Title Patient will request repetition/rephrasing to aid auditory comprehension of mod complex information (multi-step commands, conversation) in 80% of opportunities.    Baseline 85% 01/24/22    Time 6    Period Weeks    Status Achieved    Target Date 01/27/22      SLP SHORT TERM GOAL #4   Title Pt will read and reply to simple texts/emails 80% accuracy with use of accessibility features as necessary.    Baseline 01/24/22, extended time, requires occasional cues, continues to require cuing with accessibility features    Time 6    Period Weeks    Status Partially Met    Target  Date 01/27/22              SLP Long Term Goals - 01/24/22 1440       SLP LONG TERM GOAL #1   Title Pt will engage in 5-8 minutes simple-mod complex conversation re: topic of interest with supported conversation, aphasia compensations.    Time 12    Period Weeks    Status On-going      SLP LONG TERM GOAL #2   Title Pt will ID and attempt repair of communication breakdowns >80% of the time in session.    Time 12    Period Weeks    Status On-going      SLP LONG TERM GOAL #3   Title Pt will demonstrate reading comprehension of mod complex 8-10 paragraphs by generating summary or answering questions >80% accuracy.    Time 12    Period Weeks    Status On-going              Plan - 02/01/22 1121     Clinical Impression Statement Pt continues to present with moderate aphasia, with expressive deficits greater than receptive deficits. Pt was noted to be less aware of errors in written language and benefited from additional cues to plan sentences out loud before writing and re-reading sentences after. Pt was receptive to the  [place] sentence activity and quickly improved ability to read words and create grammatically correct sentences. Pt continues to improve verbal output and benefits from slowing rate and using multimodal techniques such as visual aids. Pt is progressing toward goals and benefits from continued skilled ST intervention per plan of care.    Speech Therapy Frequency 2x / week    Duration 2 weeks    Treatment/Interventions SLP instruction and feedback;Multimodal communcation approach;Patient/family education;Functional tasks;Cueing hierarchy;Compensatory techniques;Compensatory strategies    Potential to Achieve Goals Good    Potential Considerations Family/community support    SLP Home Exercise Plan [facility] Therapy sign-up and tasks provided last session.    Consulted and Agree with Plan of Care Patient              Deneise Lever, MS, CCC-SLP Speech-Language Pathologist [phone]  Aliene Altes, [place] 02/07/2022, 10:08 AM

## 2022-02-09 ENCOUNTER — Encounter: Payer: Medicare Other | Admitting: Occupational Therapy

## 2022-02-09 ENCOUNTER — Ambulatory Visit: Payer: Medicare Other | Admitting: Speech Pathology

## 2022-02-09 ENCOUNTER — Ambulatory Visit: Payer: Medicare Other

## 2022-02-09 DIAGNOSIS — R4701 Aphasia: Secondary | ICD-10-CM | POA: Diagnosis not present

## 2022-02-09 DIAGNOSIS — R278 Other lack of coordination: Secondary | ICD-10-CM

## 2022-02-09 DIAGNOSIS — R262 Difficulty in walking, not elsewhere classified: Secondary | ICD-10-CM

## 2022-02-09 DIAGNOSIS — R2681 Unsteadiness on feet: Secondary | ICD-10-CM

## 2022-02-09 DIAGNOSIS — M6281 Muscle weakness (generalized): Secondary | ICD-10-CM

## 2022-02-09 NOTE — Therapy (Signed)
OUTPATIENT PHYSICAL THERAPY TREATMENT NOTE   Patient Name: Cassidy Hernandez MRN: [id number] DOB:09-Jan-1941, 81 y.o., female Today's Date: 02/09/2022  PCP: Kirk Ruths, MD REFERRING PROVIDER: Rayann Heman, NP   PT End of Session - 02/09/22 1512     Visit Number 11    Number of Visits 24    Date for PT Re-Evaluation 03/15/22    Authorization Type BCBS Medicare    Authorization Time Period 12/21/21-03/15/22    Progress Note Due on Visit 10    PT Start Time 1517    PT Stop Time 1600    PT Time Calculation (min) 43 min    Equipment Utilized During Treatment Gait belt    Activity Tolerance Patient tolerated treatment well    Behavior During Therapy [facility] for tasks assessed/performed              Past Medical History:  Diagnosis Date   Anginal pain ([place])    Aortic atherosclerosis    Cancer ([place])    Carotid artery stenosis 02/01/2010   a.) Doppler 13/24/4010: 27% LICA and 25% RICA. b.) Doppler 36/64/4034: >74% LICA and 25% RICA. c.) Doppler 10/25/16: >70% Bilater ICAs   Chronic bilateral low back pain with left-sided sciatica    CKD (chronic kidney disease), stage III ([place])    Coronary artery disease    a.) LHC 12/24/2014 -> small LAD system; diffuse CTO of LAD. 60% RCA. LM and LCx with no sig disease. no intervention; med mgmt.   Degenerative arthritis    Diastolic dysfunction    a.) TTE 09/20/2018: EF 55%, G1DD, mild LAE and RVE, triv-mild panvalvular regurgitation.   Dysarthria    GERD (gastroesophageal reflux disease)    Hepatic steatosis    History of kidney stones    HLD (hyperlipidemia)    Hypertension    Kidney stone    Lumbar radiculopathy    Major depression in remission ([place])    PVD (peripheral vascular disease) ([place])    Renal cyst, right 06/02/2015   a.) CT 06/02/2015 --> 4.4 cm RIGHT renal mass; MRI recommended. b.) MRI 06/16/2015 --> 3.8 x 4.2 x 4.0 cm proteinaceous/hemmorhagic cyst.   Subclavian steal syndrome    a.) LHC 12/24/2014 --> tight eccentric  90% ostial stenosis with damping.   T2DM (type 2 diabetes mellitus) [facility])    Past Surgical History:  Procedure Laterality Date   ABDOMINAL HYSTERECTOMY     AUGMENTATION MAMMAPLASTY Bilateral 1980   BACK SURGERY  09/2018   BICEPT TENODESIS  06/10/2021   Procedure: BICEPS TENODESIS;  Surgeon: Corky Mull, MD;  Location: [facility];  Service: Orthopedics;;   CARDIAC CATHETERIZATION Left 12/24/2014   Procedure: LEFT HEARD CATHETERIZATION; Location: [facility]; Surgeon: Laurence Aly, MD   CHOLECYSTECTOMY     COLONOSCOPY N/A 02/11/2016   Procedure: COLONOSCOPY;  Surgeon: Manya Silvas, MD;  [facility];  Service: Endoscopy;  Laterality: N/A;   IR CT HEAD LTD  11/16/2021   IR CT HEAD LTD  11/16/2021   IR INTRAVSC STENT CERV CAROTID W/O EMB-PROT MOD SED INC ANGIO  11/18/2021   IR PERCUTANEOUS ART THROMBECTOMY/INFUSION INTRACRANIAL INC DIAG ANGIO  11/16/2021   RADIOLOGY WITH ANESTHESIA N/A 11/16/2021   Procedure: IR WITH ANESTHESIA- CODE STROKE;  Surgeon: Radiologist, Medication, MD;  Location: [facility];  Service: Radiology;  Laterality: N/A;   REVERSE SHOULDER ARTHROPLASTY Right 06/10/2021   Procedure: REVERSE SHOULDER ARTHROPLASTY;  Surgeon: Corky Mull, MD;  Location: [facility];  Service: Orthopedics;  Laterality: Right;  Patient Active Problem List   Diagnosis Date Noted   Acute ischemic left MCA stroke ([place]) 11/16/2021   Middle cerebral artery embolism, 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   Status post reverse arthroplasty of shoulder, right 06/10/2021    REFERRING DIAG: J62.831 (ICD-10-CM) - Cerebral infarction due to unspecified occlusion or stenosis of left middle cerebral artery  THERAPY DIAG:  Muscle weakness (generalized)  Other lack of coordination  Unsteadiness on feet  Difficulty in walking, not elsewhere classified  Rationale for Evaluation and Treatment Rehabilitation  PERTINENT HISTORY: Pt Recent L MCA, s/p thrombectomy and ICA stenting. Pt with speech deficits. Pt has been somewhat  unsteady with a few falls falling backward however DTR reports this is not new, but has been ongoing. 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  PRECAUTIONS: N/A  SUBJECTIVE: Pt reports she has been doing good since last session. Reports no aches/pains. Pt reports that physician said not to push up through R shoulder. Pt reports balance is "getting better but its not good". Denies as stumbles/falls.  Accompanied by sister Rod Holler  PAIN:  Are you having pain? No     TODAY'S TREATMENT:   STS: 2 x 10, cues to hold hands together to avoid pushing on knees, pt rates as "okay"  Seated marching: 2 x 20, 1.5#   Ambulation for endurance:  - 300' with 1.5# AW donned, cues for increasing step length and upright posture - 150' without AW, demonstrated improved step length, noted sig supination on RLE  FWD toe taps: 20x, onto 1st step at stairs with LUE support  Ankle 4-ways: 2 x 10 each direction, RTB, pt rates as a little tiring    PATIENT EDUCATION: Education details: Pt educated throughout session about proper posture and technique with exercises. Improved exercise technique, movement at target joints, use of target muscles after min to mod verbal, visual, tactile cues. PT instructed pt on the importance of supportive shoes rather than wearing heels.  Person educated: Patient Education method: Explanation, Demonstration, Tactile cues, and Verbal cues Education comprehension: verbalized understanding, returned demonstration, verbal cues required, tactile cues required, and needs further education   HOME EXERCISE PROGRAM:  No updates as of 02/09/22  Access Code: [email or web address]     PT Short Term Goals       PT SHORT TERM GOAL #1   Title Independence in HEP for improved balance and strength    Baseline Issued at eval    Time 3    Period Weeks    Status New    Target Date 01/11/22      PT SHORT TERM GOAL #2   Title 5xSTS hands free  <19sec    Baseline Eval: >24sec hands free;  02/02/22: 22.49sec hands free (1 LOB)   Time 4    Period Weeks    Status On-going   Target Date 01/18/22      PT SHORT TERM GOAL #3   Title Ankle DF MMT 5/5 bilat.    Baseline Eval: 4+/5 Right, 4/5 ; 02/02/22: Rt 5-/5, Lt 5/5    Time 4    Period Weeks    Status Achieved   Target Date 01/18/22              PT Long Term Goals       PT LONG TERM GOAL #1   Title 6MWT > 1234f c LRAD    Baseline July 2019: 13210f  02/02/22: 69020f 4WW, no  LOB CGA   Time 8    Period Weeks    Status On-going   Target Date 02/15/22      PT LONG TERM GOAL #2   Title 5xSTS <13 sec hands free    Baseline eval: >24sec hands free ; 02/02/22: 22sec hands free   Time 8    Period Weeks    Status On-going   Target Date 02/15/22      PT LONG TERM GOAL #3   Title Pt to demonstrate retroAMB without device >0.67ms s LOB to demonstrate improved ankle righting and control.    Baseline Eval: <0.03m and c 3 LOB; 02/02/22: 0.1558m    Time 10    Period Weeks    Status On-going   Target Date 03/01/22      PT LONG TERM GOAL #4   Title FOTO Score increase >15    Baseline eval: 48; 02/02/22: 62   Time 12    Period Weeks    Status On-going   Target Date 03/15/22              Plan     Clinical Impression Statement Pt required multiple cues to avoid pushing through RUE during session, SPT instructed pt to hold hands together during STS in order to stay within precautions. During ambulation, significant R foot supination noted on initial contact through midstance. PT discussed with the pt's sister, who reported that the pt has been having RLE pain and states the pt prefers to wear heels and refuses to wear sneakers/other supportive shoes. PT and SPT discussed with the pt and pt's sister the importance of supportive footwear and the benefits including increasing ankle stability and potentially decreasing RLE pain. Pt and pt's sister verbalized understanding,  however, pt has a hx of noncompliance with HEP and refusals to wearing supportive footwear. Pt will beneift from continued skilled PT to address impairments in order to maximize independence and safety in ADLs/IADLs and to reduce risk of falls.   Personal Factors and Comorbidities Past/Current Experience    Examination-Activity Limitations Dressing;Transfers;Bend;Locomotion Level;Stairs;Stand    Examination-Participation Restrictions Cleaning;Shop;Meal Prep;[place] Leaverrk;Laundry    Stability/Clinical Decision Making Evolving/Moderate complexity    Rehab Potential Good    Clinical Impairments Affecting Rehab Potential (+) motivation, social support (-) age, chronicity of pain, fairly sedentary lifestyle    PT Frequency 2x / week    PT Duration 12 weeks    PT Treatment/Interventions Aquatic Therapy;Moist Heat;Functional mobility training;Dry needling;Neuromuscular re-education;Traction;Ultrasound;Iontophoresis '4mg'$ /ml Dexamethasone;ADLs/Self Care Home Management;Cryotherapy;Electrical Stimulation;Therapeutic exercise;Therapeutic activities;Patient/family education;Taping;Gait training;Stair training;DME Instruction;Balance training;Passive range of motion    PT Next Visit Plan Mobility, gait, endurance, strengthening    PT Home Exercise Plan Eval: seated heel raises, toe raises, and STS from elevated surface. 01/04/2022=Access Code: [email or web address]: [email or web address]    Consulted and Agree with Plan of Care Patient;Family member/caregiver    Family Member Consulted RutRod Hollerister)            NicIzola PricePT  This entire session was performed under direct supervision and direction of a licensed therapist. I have personally read, edited and approve of the note as written. HalRicard Dillon, DPT

## 2022-02-10 ENCOUNTER — Encounter: Payer: Medicare Other | Admitting: Occupational Therapy

## 2022-02-10 ENCOUNTER — Ambulatory Visit: Payer: Medicare Other

## 2022-02-10 DIAGNOSIS — R2681 Unsteadiness on feet: Secondary | ICD-10-CM

## 2022-02-10 DIAGNOSIS — M6281 Muscle weakness (generalized): Secondary | ICD-10-CM

## 2022-02-10 DIAGNOSIS — R4701 Aphasia: Secondary | ICD-10-CM | POA: Diagnosis not present

## 2022-02-10 DIAGNOSIS — R262 Difficulty in walking, not elsewhere classified: Secondary | ICD-10-CM

## 2022-02-10 NOTE — Therapy (Signed)
OUTPATIENT SPEECH LANGUAGE PATHOLOGY TREATMENT NOTE   Patient Name: Cassidy Hernandez MRN: [id number] DOB:27-Apr-1941, 81 y.o., female Today's Date: 02/10/2022   REFERRING PROVIDER: Merrilee Seashore, NP    End of Session - 02/09/22 1627     Visit Number 16    Number of Visits 25    Date for SLP Re-Evaluation 03/16/22    Authorization Type BCBS MCR; $10 copay    Authorization - Visit Number 5    Progress Note Due on Visit 10    SLP Start Time 1605    SLP Stop Time  1700    SLP Time Calculation (min) 55 min    Activity Tolerance Patient tolerated treatment well             Past Medical History:  Diagnosis Date   Anginal pain ([place])    Aortic atherosclerosis    Cancer ([place])    Carotid artery stenosis 02/01/2010   a.) Doppler 52/84/1324: 40% LICA and 10% RICA. b.) Doppler 27/25/3664: >40% LICA and 34% RICA. c.) Doppler 10/25/16: >70% Bilater ICAs   Chronic bilateral low back pain with left-sided sciatica    CKD (chronic kidney disease), stage III (HCC)    Coronary artery disease    a.) LHC 12/24/2014 -> small LAD system; diffuse CTO of LAD. 60% RCA. LM and LCx with no sig disease. no intervention; med mgmt.   Degenerative arthritis    Diastolic dysfunction    a.) TTE 09/20/2018: EF 55%, G1DD, mild LAE and RVE, triv-mild panvalvular regurgitation.   Dysarthria    GERD (gastroesophageal reflux disease)    Hepatic steatosis    History of kidney stones    HLD (hyperlipidemia)    Hypertension    Kidney stone    Lumbar radiculopathy    Major depression in remission ([place])    PVD (peripheral vascular disease) ([place])    Renal cyst, right 06/02/2015   a.) CT 06/02/2015 --> 4.4 cm RIGHT renal mass; MRI recommended. b.) MRI 06/16/2015 --> 3.8 x 4.2 x 4.0 cm proteinaceous/hemmorhagic cyst.   Subclavian steal syndrome    a.) LHC 12/24/2014 --> tight eccentric 90% ostial stenosis with damping.   T2DM (type 2 diabetes mellitus) [facility])    Past Surgical History:  Procedure Laterality Date    ABDOMINAL HYSTERECTOMY     AUGMENTATION MAMMAPLASTY Bilateral 1980   BACK SURGERY  09/2018   BICEPT TENODESIS  06/10/2021   Procedure: BICEPS TENODESIS;  Surgeon: Corky Mull, MD;  Location: [facility];  Service: Orthopedics;;   CARDIAC CATHETERIZATION Left 12/24/2014   Procedure: LEFT HEARD CATHETERIZATION; Location: [facility]; Surgeon: Laurence Aly, MD   CHOLECYSTECTOMY     COLONOSCOPY N/A 02/11/2016   Procedure: COLONOSCOPY;  Surgeon: Manya Silvas, MD;  Location: [facility] ENDOSCOPY;  Service: Endoscopy;  Laterality: N/A;   IR CT HEAD LTD  11/16/2021   IR CT HEAD LTD  11/16/2021   IR INTRAVSC STENT CERV CAROTID W/O EMB-PROT MOD SED INC ANGIO  11/18/2021   IR PERCUTANEOUS ART THROMBECTOMY/INFUSION INTRACRANIAL INC DIAG ANGIO  11/16/2021   RADIOLOGY WITH ANESTHESIA N/A 11/16/2021   Procedure: IR WITH ANESTHESIA- CODE STROKE;  Surgeon: Radiologist, Medication, MD;  Location: [place];  Service: Radiology;  Laterality: N/A;   REVERSE SHOULDER ARTHROPLASTY Right 06/10/2021   Procedure: REVERSE SHOULDER ARTHROPLASTY;  Surgeon: Corky Mull, MD;  Location: [facility];  Service: Orthopedics;  Laterality: Right;   Patient Active Problem List   Diagnosis Date Noted   Acute ischemic left MCA stroke ([place]) 11/16/2021  Middle cerebral artery embolism, 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   Status post reverse arthroplasty of shoulder, right 06/10/2021    ONSET DATE: 11/16/21   REFERRING DIAG: L MCA CVA   HPI: Pt is a 81 y.o. 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 Patient is s/p thrombectomy with left ICA stent placement 11/16/21. She completed inpatient rehabilitation at [facility] 11/25/21-12/07/21 and was discharged home with family. Patient known to this clinic from prior course of voice therapy in 2018. Pt with documented hx of voice and speech changes (dysarthria) per videostroboscopy 12/26/17; did not attend therapy.   THERAPY DIAG:  Aphasia  Rationale for Evaluation and Treatment  Rehabilitation  SUBJECTIVE: "It's too late." Pt prefers morning appointments  Pt accompanied by: sister Rod Holler  PAIN: No Are you having pain? No     OBJECTIVE:   TODAY'S TREATMENT: Pt reported communication is more difficult when she is fatigued (at the end of the day). SLP and graduate clinician facilitated simple conversation (5 minutes) re: 4th of July, with pt requiring occasional min cues to slow rate, provide description when anomia or articulatory errors occurred. Facilitated wordfinding and error awareness with sentence completion task; pt completed open-ended sentences by adding 1-3 words 80% acc with occasional min cues. Pt reviewed and read her sentences aloud after completing, identifying and correcting 4/4 errors with min-mod A. Updated [employer] account with new tasks adjusted to meet pt's performance level. With mod complex sentence completion, pt chose word from F:4 70% accuracy with occasional mod cues.   PATIENT EDUCATION: Education details: take breaks; communication breakdowns may be more frequent occurrence when fatigued; suggestions to conserve energy when communication demands increase at end of the day Person educated: Patient Education method: Explanation, Demonstration, and Verbal cues Education comprehension: verbalized understanding and needs further education   SLP Short Term Goals - 01/24/22 1439       SLP SHORT TERM GOAL #1   Title Pt will communicate emergency information 100% accuracy using visual aid if necessary.    Baseline 100% 01/24/22    Time 6    Period Weeks    Status Achieved    Target Date 01/27/22      SLP SHORT TERM GOAL #2   Title Pt will generate at least 4 descriptors of target word 80% of the time using semantic feature analysis to improve abilities in wordfinding and resolving communication breakdowns.    Baseline 85% 01/24/22    Time 6    Period Weeks    Status Achieved    Target Date 01/27/22      SLP SHORT TERM GOAL #3    Title Patient will request repetition/rephrasing to aid auditory comprehension of mod complex information (multi-step commands, conversation) in 80% of opportunities.    Baseline 85% 01/24/22    Time 6    Period Weeks    Status Achieved    Target Date 01/27/22      SLP SHORT TERM GOAL #4   Title Pt will read and reply to simple texts/emails 80% accuracy with use of accessibility features as necessary.    Baseline 01/24/22, extended time, requires occasional cues, continues to require cuing with accessibility features    Time 6    Period Weeks    Status Partially Met    Target Date 01/27/22              SLP Long Term Goals - 01/24/22 1440       SLP LONG TERM GOAL #  1   Title Pt will engage in 5-8 minutes simple-mod complex conversation re: topic of interest with supported conversation, aphasia compensations.    Time 12    Period Weeks    Status On-going      SLP LONG TERM GOAL #2   Title Pt will ID and attempt repair of communication breakdowns >80% of the time in session.    Time 12    Period Weeks    Status On-going      SLP LONG TERM GOAL #3   Title Pt will demonstrate reading comprehension of mod complex 8-10 paragraphs by generating summary or answering questions >80% accuracy.    Time 12    Period Weeks    Status On-going              Plan - 02/01/22 1121     Clinical Impression Statement Pt continues to present with moderate aphasia, with expressive deficits greater than receptive deficits. Pt implemented self-checking and identified errors in writing at phrase level with min-mod A. Fatigue noted to have impact on pt during conversation and structured tasks with appointment at the end of the day vs am. Pt continues to improve verbal output and benefits from slowing rate and using multimodal techniques such as visual aids. Pt is progressing toward goals and benefits from continued skilled ST intervention per plan of care.    Speech Therapy Frequency 2x / week     Duration 2 weeks    Treatment/Interventions SLP instruction and feedback;Multimodal communcation approach;Patient/family education;Functional tasks;Cueing hierarchy;Compensatory techniques;Compensatory strategies    Potential to Achieve Goals Good    Potential Considerations Family/community support    SLP Home Exercise Plan Talk[employer] Therapy sign-up and tasks provided last session.    Consulted and Agree with Plan of Care Patient              Deneise Lever, MS, CCC-SLP Speech-Language Pathologist [phone]  Aliene Altes, [place] 02/10/2022, 8:32 AM

## 2022-02-10 NOTE — Therapy (Signed)
OUTPATIENT PHYSICAL THERAPY TREATMENT NOTE   Patient Name: Cassidy Hernandez MRN: [id number] DOB:07/06/1941, 81 y.o., female Today's Date: 02/10/2022  PCP: Kirk Ruths, MD REFERRING PROVIDER: Rayann Heman, NP   PT End of Session - 02/10/22 1513     Visit Number 12    Number of Visits 24    Date for PT Re-Evaluation 03/15/22    Authorization Type BCBS Medicare    Authorization Time Period 12/21/21-03/15/22    Progress Note Due on Visit 10    PT Start Time 1515    PT Stop Time 1559    PT Time Calculation (min) 44 min    Equipment Utilized During Treatment Gait belt    Activity Tolerance Patient tolerated treatment well    Behavior During Therapy [facility] for tasks assessed/performed              Past Medical History:  Diagnosis Date   Anginal pain ([place])    Aortic atherosclerosis    Cancer ([place])    Carotid artery stenosis 02/01/2010   a.) Doppler 32/20/2542: 70% LICA and 62% RICA. b.) Doppler 37/62/8315: >17% LICA and 61% RICA. c.) Doppler 10/25/16: >70% Bilater ICAs   Chronic bilateral low back pain with left-sided sciatica    CKD (chronic kidney disease), stage III ([place])    Coronary artery disease    a.) LHC 12/24/2014 -> small LAD system; diffuse CTO of LAD. 60% RCA. LM and LCx with no sig disease. no intervention; med mgmt.   Degenerative arthritis    Diastolic dysfunction    a.) TTE 09/20/2018: EF 55%, G1DD, mild LAE and RVE, triv-mild panvalvular regurgitation.   Dysarthria    GERD (gastroesophageal reflux disease)    Hepatic steatosis    History of kidney stones    HLD (hyperlipidemia)    Hypertension    Kidney stone    Lumbar radiculopathy    Major depression in remission ([place])    PVD (peripheral vascular disease) ([place])    Renal cyst, right 06/02/2015   a.) CT 06/02/2015 --> 4.4 cm RIGHT renal mass; MRI recommended. b.) MRI 06/16/2015 --> 3.8 x 4.2 x 4.0 cm proteinaceous/hemmorhagic cyst.   Subclavian steal syndrome    a.) LHC 12/24/2014 --> tight eccentric  90% ostial stenosis with damping.   T2DM (type 2 diabetes mellitus) [facility])    Past Surgical History:  Procedure Laterality Date   ABDOMINAL HYSTERECTOMY     AUGMENTATION MAMMAPLASTY Bilateral 1980   BACK SURGERY  09/2018   BICEPT TENODESIS  06/10/2021   Procedure: BICEPS TENODESIS;  Surgeon: Corky Mull, MD;  Location: [facility];  Service: Orthopedics;;   CARDIAC CATHETERIZATION Left 12/24/2014   Procedure: LEFT HEARD CATHETERIZATION; Location: [facility]; Surgeon: Laurence Aly, MD   CHOLECYSTECTOMY     COLONOSCOPY N/A 02/11/2016   Procedure: COLONOSCOPY;  Surgeon: Manya Silvas, MD;  Location: [facility] ENDOSCOPY;  Service: Endoscopy;  Laterality: N/A;   IR CT HEAD LTD  11/16/2021   IR CT HEAD LTD  11/16/2021   IR INTRAVSC STENT CERV CAROTID W/O EMB-PROT MOD SED INC ANGIO  11/18/2021   IR PERCUTANEOUS ART THROMBECTOMY/INFUSION INTRACRANIAL INC DIAG ANGIO  11/16/2021   RADIOLOGY WITH ANESTHESIA N/A 11/16/2021   Procedure: IR WITH ANESTHESIA- CODE STROKE;  Surgeon: Radiologist, Medication, MD;  Location: [place];  Service: Radiology;  Laterality: N/A;   REVERSE SHOULDER ARTHROPLASTY Right 06/10/2021   Procedure: REVERSE SHOULDER ARTHROPLASTY;  Surgeon: Corky Mull, MD;  Location: [facility];  Service: Orthopedics;  Laterality: Right;  Patient Active Problem List   Diagnosis Date Noted   Acute ischemic left MCA stroke ([place]) 11/16/2021   Middle cerebral artery embolism, 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   Status post reverse arthroplasty of shoulder, right 06/10/2021    REFERRING DIAG: W40.973 (ICD-10-CM) - Cerebral infarction due to unspecified occlusion or stenosis of left middle cerebral artery  THERAPY DIAG:  Muscle weakness (generalized)  Unsteadiness on feet  Difficulty in walking, not elsewhere classified  Rationale for Evaluation and Treatment Rehabilitation  PERTINENT HISTORY: Pt Recent L MCA, s/p thrombectomy and ICA stenting. Pt with speech deficits. Pt has been somewhat unsteady with a few falls  falling backward however DTR reports this is not new, but has been ongoing. 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  PRECAUTIONS: N/A  SUBJECTIVE: Patient reports she was at PT yesterday. No falls or LOB since last session   PAIN:  Are you having pain? No     TODAY'S TREATMENT:  INTERVENTIONS:    BP=149/53  mmHg Right UE in sitting at rest   Therapeutic Exercises:    -Standing forward toe tap onto 1st step at stairs with BUE UE support; x20 reps alt LE    -6" step up to 1st step at stairs, BUE support, x10 reps each LE  -6" step; lateral step up BUE support x10 each LE   -STS from standard height chair, no UE support, 2x10      seated: RTB hamstring curl 15x each LE Lateral step over hurdle 15x each LE seated     Neuromuscular re-ed:    lateral stepping over 6" hurdle, BUE support, x10 BLE  Standing with CGA next to support surface:  Airex pad: static stand 30 seconds x 2 trials, noticeable trembling of ankles/LE's with fatigue and challenge to maintain stability Airex pad: horizontal head turns 30 seconds scanning room 10x ; cueing for arc of motion  Airex pad: vertical head turns 30 seconds, cueing for arc of motion, noticeable sway with upward gaze increasing demand on ankle righting reaction musculature  Seated: -on dynadisc 2 minutes for core stabilization; very challenging for patient    Education provided throughout session via VC/TC and demonstration to facilitate movement at target joints and correct muscle activation for all testing and exercises performed.      PATIENT EDUCATION: Education details: POC, Economist Person educated: Patient Education method: Explanation, Demonstration, Tactile cues, and Verbal cues Education comprehension: verbalized understanding, returned demonstration, verbal cues required, tactile cues required, and needs further education   HOME EXERCISE  PROGRAM: Access Code: [email or web address]     PT Short Term Goals       PT SHORT TERM GOAL #1   Title Independence in HEP for improved balance and strength    Baseline Issued at eval    Time 3    Period Weeks    Status New    Target Date 01/11/22      PT SHORT TERM GOAL #2   Title 5xSTS hands free <19sec    Baseline Eval: >24sec hands free    Time 4    Period Weeks    Status New    Target Date 01/18/22      PT SHORT TERM GOAL #3   Title Ankle DF MMT 5/5 bilat.    Baseline Eval: 4+/5 Right, 4/5 Left    Time 4    Period Weeks    Status New    Target Date 01/18/22  PT Long Term Goals       PT LONG TERM GOAL #1   Title 6MWT > 1237f c LRAD    Baseline July 2019: 1322f   Time 8    Period Weeks    Status New    Target Date 02/15/22      PT LONG TERM GOAL #2   Title 5xSTS <13 sec hands free    Baseline eval: >24sec hands free    Time 8    Period Weeks    Status New    Target Date 02/15/22      PT LONG TERM GOAL #3   Title Pt to demonstrate retroAMB without device >0.2541ms LOB to demonstrate improved ankle righting and control.    Baseline Eval: <0.48m45mnd c 3 LOB    Time 10    Period Weeks    Status New    Target Date 03/01/22      PT LONG TERM GOAL #4   Title FOTO Score increase >15    Baseline eval: 48    Time 12    Period Weeks    Status New    Target Date 03/15/22              Plan     Clinical Impression Statement Patient presents with excellent motivation. She does report intermittent knee pain with stair tasks but tolerates rest of session without report of pain. Patient is fearful of LOB while on airex pad requiring frequent encouragement.  Pt will beneift from continued skilled PT interventions to address impairments in order to maximize independence and safety in ADL/IADL and to reduce risk of falls.     Personal Factors and Comorbidities Past/Current Experience    Examination-Activity Limitations  Dressing;Transfers;Bend;Locomotion Level;Stairs;Stand    Examination-Participation Restrictions Cleaning;Shop;Meal Prep;Yard[employer]alla [place];Laundry    Stability/Clinical Decision Making Evolving/Moderate complexity    Rehab Potential Good    Clinical Impairments Affecting Rehab Potential (+) motivation, social support (-) age, chronicity of pain, fairly sedentary lifestyle    PT Frequency 2x / week    PT Duration 12 weeks    PT Treatment/Interventions Aquatic Therapy;Moist Heat;Functional mobility training;Dry needling;Neuromuscular re-education;Traction;Ultrasound;Iontophoresis '4mg'$ /ml Dexamethasone;ADLs/Self Care Home Management;Cryotherapy;Electrical Stimulation;Therapeutic exercise;Therapeutic activities;Patient/family education;Taping;Gait training;Stair training;DME Instruction;Balance training;Passive range of motion    PT Next Visit Plan Progress safety with mobility and LE strengthening as appropriate.    PT Home Exercise Plan Eval: seated heel raises, toe raises, and STS from elevated surface. 01/04/2022=Access Code: [email or web address]: [email or web address]    Consulted and Agree with Plan of Care Patient;Family member/caregiver    Family Member Consulted [place], [place] 02/10/22

## 2022-02-14 ENCOUNTER — Encounter: Payer: Medicare Other | Admitting: Speech Pathology

## 2022-02-14 ENCOUNTER — Encounter: Payer: Medicare Other | Admitting: Occupational Therapy

## 2022-02-15 ENCOUNTER — Ambulatory Visit: Payer: Medicare Other

## 2022-02-15 ENCOUNTER — Encounter: Payer: Medicare Other | Admitting: Occupational Therapy

## 2022-02-15 DIAGNOSIS — R2681 Unsteadiness on feet: Secondary | ICD-10-CM

## 2022-02-15 DIAGNOSIS — R4701 Aphasia: Secondary | ICD-10-CM | POA: Diagnosis not present

## 2022-02-15 DIAGNOSIS — R262 Difficulty in walking, not elsewhere classified: Secondary | ICD-10-CM

## 2022-02-15 DIAGNOSIS — R278 Other lack of coordination: Secondary | ICD-10-CM

## 2022-02-15 DIAGNOSIS — M6281 Muscle weakness (generalized): Secondary | ICD-10-CM

## 2022-02-15 NOTE — Therapy (Signed)
OUTPATIENT PHYSICAL THERAPY TREATMENT NOTE   Patient Name: Cassidy Hernandez MRN: [id number] DOB:[birth year], 81 y.o., female Today's Date: 02/16/2022  PCP: Kirk Ruths, MD REFERRING PROVIDER: Rayann Heman, NP   PT End of Session - 02/16/22 1349     Visit Number 14    Number of Visits 24    Date for PT Re-Evaluation 03/15/22    Authorization Type BCBS Medicare    Authorization Time Period 12/21/21-03/15/22    Progress Note Due on Visit 10    PT Start Time 1345    PT Stop Time 1429    PT Time Calculation (min) 44 min    Equipment Utilized During Treatment Gait belt    Activity Tolerance Patient tolerated treatment well    Behavior During Therapy [facility] for tasks assessed/performed               Past Medical History:  Diagnosis Date   Anginal pain ([place])    Aortic atherosclerosis    Cancer ([place])    Carotid artery stenosis 02/01/2010   a.) Doppler 09/98/3382: 50% LICA and 53% RICA. b.) Doppler 97/67/3419: >37% LICA and 90% RICA. c.) Doppler 10/25/16: >70% Bilater ICAs   Chronic bilateral low back pain with left-sided sciatica    CKD (chronic kidney disease), stage III (HCC)    Coronary artery disease    a.) LHC 12/24/2014 -> small LAD system; diffuse CTO of LAD. 60% RCA. LM and LCx with no sig disease. no intervention; med mgmt.   Degenerative arthritis    Diastolic dysfunction    a.) TTE 09/20/2018: EF 55%, G1DD, mild LAE and RVE, triv-mild panvalvular regurgitation.   Dysarthria    GERD (gastroesophageal reflux disease)    Hepatic steatosis    History of kidney stones    HLD (hyperlipidemia)    Hypertension    Kidney stone    Lumbar radiculopathy    Major depression in remission ([place])    PVD (peripheral vascular disease) ([place])    Renal cyst, right 06/02/2015   a.) CT 06/02/2015 --> 4.4 cm RIGHT renal mass; MRI recommended. b.) MRI 06/16/2015 --> 3.8 x 4.2 x 4.0 cm proteinaceous/hemmorhagic cyst.   Subclavian steal syndrome    a.) LHC 12/24/2014 --> tight  eccentric 90% ostial stenosis with damping.   T2DM (type 2 diabetes mellitus) [facility])    Past Surgical History:  Procedure Laterality Date   ABDOMINAL HYSTERECTOMY     AUGMENTATION MAMMAPLASTY Bilateral 1980   BACK SURGERY  09/2018   BICEPT TENODESIS  06/10/2021   Procedure: BICEPS TENODESIS;  Surgeon: Corky Mull, MD;  Location: [facility];  Service: Orthopedics;;   CARDIAC CATHETERIZATION Left 12/24/2014   Procedure: LEFT HEARD CATHETERIZATION; Location: [facility]; Surgeon: Laurence Aly, MD   CHOLECYSTECTOMY     COLONOSCOPY N/A 02/11/2016   Procedure: COLONOSCOPY;  Surgeon: Manya Silvas, MD;  Location: [facility]COPY;  Service: Endoscopy;  Laterality: N/A;   IR CT HEAD LTD  11/16/2021   IR CT HEAD LTD  11/16/2021   IR INTRAVSC STENT CERV CAROTID W/O EMB-PROT MOD SED INC ANGIO  11/18/2021   IR PERCUTANEOUS ART THROMBECTOMY/INFUSION INTRACRANIAL INC DIAG ANGIO  11/16/2021   RADIOLOGY WITH ANESTHESIA N/A 11/16/2021   Procedure: IR WITH ANESTHESIA- CODE STROKE;  Surgeon: Radiologist, Medication, MD;  Location: [place];  Service: Radiology;  Laterality: N/A;   REVERSE SHOULDER ARTHROPLASTY Right 06/10/2021   Procedure: REVERSE SHOULDER ARTHROPLASTY;  Surgeon: Corky Mull, MD;  Location: [facility];  Service: Orthopedics;  Laterality:  Right;   Patient Active Problem List   Diagnosis Date Noted   Acute ischemic left MCA stroke ([place]) 11/16/2021   Middle cerebral artery embolism, 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   Status post reverse arthroplasty of shoulder, right 06/10/2021    REFERRING DIAG: E17.408 (ICD-10-CM) - Cerebral infarction due to unspecified occlusion or stenosis of left middle cerebral artery  THERAPY DIAG:  Muscle weakness (generalized)  Unsteadiness on feet  Difficulty in walking, not elsewhere classified  Rationale for Evaluation and Treatment Rehabilitation  PERTINENT HISTORY: Pt Recent L MCA, s/p thrombectomy and ICA stenting. Pt with speech deficits. Pt has been somewhat unsteady with a few  falls falling backward however DTR reports this is not new, but has been ongoing. 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  PRECAUTIONS: N/A  SUBJECTIVE: Patient presents with her brother, no pain or LOB since last session. Patient forgot to take her blood pressure  medication.   PAIN:  Are you having pain? No     TODAY'S TREATMENT:  INTERVENTIONS:    BP= 185/89  mmHg Right UE sitting : but moving arm BP taken second time in R arm with arm at rest: 114/85  BP third time R arm: 143/91    Therapeutic Exercises:   -STS from standard height chair, no UE support, 2x10      seated: RTB around knees: -march 15x each LE     Neuromuscular re-ed:    lateral stepping over 6" hurdle, BUE support, x10 BLE  Standing with CGA next to support surface:  Airex pad: static stand 30 seconds x 2 trials, noticeable trembling of ankles/LE's with fatigue and challenge to maintain stability Airex pad: horizontal head turns 30 seconds scanning room 10x ; cueing for arc of motion  Airex pad: vertical head turns 30 seconds, cueing for arc of motion, noticeable sway with upward gaze increasing demand on ankle righting reaction musculature Airex pad: saebo ball transfer x 4 minutes  Airex balance beam: -lateral stepping 6x length of // bars  -tandem walking 6x length of // bars with BUE support  Seated: -on dynadisc 2 minutes for core stabilization; very challenging for patient    Education provided throughout session via VC/TC and demonstration to facilitate movement at target joints and correct muscle activation for all testing and exercises performed.      PATIENT EDUCATION: Education details: POC, Economist Person educated: Patient Education method: Explanation, Demonstration, Tactile cues, and Verbal cues Education comprehension: verbalized understanding, returned demonstration, verbal cues required, tactile cues required,  and needs further education   HOME EXERCISE PROGRAM: Access Code: [email or web address]     PT Short Term Goals       PT SHORT TERM GOAL #1   Title Independence in HEP for improved balance and strength    Baseline Issued at eval    Time 3    Period Weeks    Status New    Target Date 01/11/22      PT SHORT TERM GOAL #2   Title 5xSTS hands free <19sec    Baseline Eval: >24sec hands free    Time 4    Period Weeks    Status New    Target Date 01/18/22      PT SHORT TERM GOAL #3   Title Ankle DF MMT 5/5 bilat.    Baseline Eval: 4+/5 Right, 4/5 Left    Time 4    Period Weeks    Status  New    Target Date 01/18/22              PT Long Term Goals       PT LONG TERM GOAL #1   Title 6MWT > 1258f c LRAD    Baseline July 2019: 13236f   Time 8    Period Weeks    Status New    Target Date 02/15/22      PT LONG TERM GOAL #2   Title 5xSTS <13 sec hands free    Baseline eval: >24sec hands free    Time 8    Period Weeks    Status New    Target Date 02/15/22      PT LONG TERM GOAL #3   Title Pt to demonstrate retroAMB without device >0.2528ms LOB to demonstrate improved ankle righting and control.    Baseline Eval: <0.4m52mnd c 3 LOB    Time 10    Period Weeks    Status New    Target Date 03/01/22      PT LONG TERM GOAL #4   Title FOTO Score increase >15    Baseline eval: 48    Time 12    Period Weeks    Status New    Target Date 03/15/22              Plan     Clinical Impression Statement Patient session requires constant monitoring of vitals to ensure therapeutic range this session. She is highly motivated and eager to progress herself. She is challenged with forward reaching on unstable surface requiring UE support and still having LOB. Pt will benefit from continued skilled PT interventions to address impairments in order to maximize independence and safety in ADL/IADL and to reduce risk of falls.     Personal Factors and Comorbidities Past/Current  Experience    Examination-Activity Limitations Dressing;Transfers;Bend;Locomotion Level;Stairs;Stand    Examination-Participation Restrictions Cleaning;Shop;Meal Prep;YardValla Leaverk;Laundry    Stability/Clinical Decision Making Evolving/Moderate complexity    Rehab Potential Good    Clinical Impairments Affecting Rehab Potential (+) motivation, social support (-) age, chronicity of pain, fairly sedentary lifestyle    PT Frequency 2x / week    PT Duration 12 weeks    PT Treatment/Interventions Aquatic Therapy;Moist Heat;Functional mobility training;Dry needling;Neuromuscular re-education;Traction;Ultrasound;Iontophoresis '4mg'$ /ml Dexamethasone;ADLs/Self Care Home Management;Cryotherapy;Electrical Stimulation;Therapeutic exercise;Therapeutic activities;Patient/family education;Taping;Gait training;Stair training;DME Instruction;Balance training;Passive range of motion    PT Next Visit Plan Progress safety with mobility and LE strengthening as appropriate.    PT Home Exercise Plan Eval: seated heel raises, toe raises, and STS from elevated surface. 01/04/2022=Access Code: [email or web address]: [email or web address]    Consulted and Agree with Plan of Care Patient;Family member/caregiver    Family Member Consulted EarnAnsonia, [place]T 02/16/22

## 2022-02-15 NOTE — Therapy (Signed)
OUTPATIENT PHYSICAL THERAPY TREATMENT NOTE   Patient Name: Cassidy Hernandez MRN: [id number] DOB:June 28, 1941, 81 y.o., female Today's Date: 02/15/2022  PCP: Kirk Ruths, MD REFERRING PROVIDER: Rayann Heman, NP   PT End of Session - 02/15/22 1112     Visit Number 13    Number of Visits 24    Date for PT Re-Evaluation 03/15/22    Authorization Type BCBS Medicare    Authorization Time Period 12/21/21-03/15/22    Progress Note Due on Visit 10    PT Start Time 1108    PT Stop Time 1145 (P)     PT Time Calculation (min) 37 min (P)     Equipment Utilized During Treatment Gait belt    Activity Tolerance Patient tolerated treatment well    Behavior During Therapy WFL for tasks assessed/performed               Past Medical History:  Diagnosis Date   Anginal pain ([place])    Aortic atherosclerosis    Cancer ([place])    Carotid artery stenosis 02/01/2010   a.) Doppler 54/56/2563: 89% LICA and 37% RICA. b.) Doppler 34/28/7681: >15% LICA and 72% RICA. c.) Doppler 10/25/16: >70% Bilater ICAs   Chronic bilateral low back pain with left-sided sciatica    CKD (chronic kidney disease), stage III (HCC)    Coronary artery disease    a.) LHC 12/24/2014 -> small LAD system; diffuse CTO of LAD. 60% RCA. LM and LCx with no sig disease. no intervention; med mgmt.   Degenerative arthritis    Diastolic dysfunction    a.) TTE 09/20/2018: EF 55%, G1DD, mild LAE and RVE, triv-mild panvalvular regurgitation.   Dysarthria    GERD (gastroesophageal reflux disease)    Hepatic steatosis    History of kidney stones    HLD (hyperlipidemia)    Hypertension    Kidney stone    Lumbar radiculopathy    Major depression in remission ([place])    PVD (peripheral vascular disease) ([place])    Renal cyst, right 06/02/2015   a.) CT 06/02/2015 --> 4.4 cm RIGHT renal mass; MRI recommended. b.) MRI 06/16/2015 --> 3.8 x 4.2 x 4.0 cm proteinaceous/hemmorhagic cyst.   Subclavian steal syndrome    a.) LHC 12/24/2014 -->  tight eccentric 90% ostial stenosis with damping.   T2DM (type 2 diabetes mellitus) [facility])    Past Surgical History:  Procedure Laterality Date   ABDOMINAL HYSTERECTOMY     AUGMENTATION MAMMAPLASTY Bilateral 1980   BACK SURGERY  09/2018   BICEPT TENODESIS  06/10/2021   Procedure: BICEPS TENODESIS;  Surgeon: Corky Mull, MD;  Location: [facility];  Service: Orthopedics;;   CARDIAC CATHETERIZATION Left 12/24/2014   Procedure: LEFT HEARD CATHETERIZATION; Location: [facility]; Surgeon: Laurence Aly, MD   CHOLECYSTECTOMY     COLONOSCOPY N/A 02/11/2016   Procedure: COLONOSCOPY;  Surgeon: Manya Silvas, MD;  Location: [facility] ENDOSCOPY;  Service: Endoscopy;  Laterality: N/A;   IR CT HEAD LTD  11/16/2021   IR CT HEAD LTD  11/16/2021   IR INTRAVSC STENT CERV CAROTID W/O EMB-PROT MOD SED INC ANGIO  11/18/2021   IR PERCUTANEOUS ART THROMBECTOMY/INFUSION INTRACRANIAL INC DIAG ANGIO  11/16/2021   RADIOLOGY WITH ANESTHESIA N/A 11/16/2021   Procedure: IR WITH ANESTHESIA- CODE STROKE;  Surgeon: Radiologist, Medication, MD;  Location: [place];  Service: Radiology;  Laterality: N/A;   REVERSE SHOULDER ARTHROPLASTY Right 06/10/2021   Procedure: REVERSE SHOULDER ARTHROPLASTY;  Surgeon: Corky Mull, MD;  Location: [facility];  Service: Orthopedics;  Laterality: Right;   Patient Active Problem List   Diagnosis Date Noted   Acute ischemic left MCA stroke ([place]) 11/16/2021   Middle cerebral artery embolism, 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   Status post reverse arthroplasty of shoulder, right 06/10/2021    REFERRING DIAG: W43.154 (ICD-10-CM) - Cerebral infarction due to unspecified occlusion or stenosis of left middle cerebral artery  THERAPY DIAG:  Muscle weakness (generalized)  Unsteadiness on feet  Difficulty in walking, not elsewhere classified  Other lack of coordination  Rationale for Evaluation and Treatment Rehabilitation  PERTINENT HISTORY: Pt Recent L MCA, s/p thrombectomy and ICA stenting. Pt with speech deficits. Pt has  been somewhat unsteady with a few falls falling backward however DTR reports this is not new, but has been ongoing. 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  PRECAUTIONS: N/A  SUBJECTIVE: Patient reports she has been good since last session. Denies any stumbles/falls.  PAIN:  Are you having pain? No     TODAY'S TREATMENT:   02/15/2022   BP: 149/67  mmHg Right UE in sitting at rest HR: 76 bpm   Therapeutic Exercises:   STS from standard height chair: 2 x 10, no UE support, VC for FWD trunk lean  Seated hamstring curl: 2 x 12 each LE, RTB, rates as medium, reports R LE feels weaker than L  6" step up to 1st step at stairs: 10x each LE, UUE with LLE step ups, BUE support with RLE step ups    Neuromuscular re-ed:  On airex: - NBOS EO: 2 x 30 sec - WBOS EO: 60 sec - WBOS with horizontal head turns: 30x, noticeable posterior sway noted when turning head to R  Seated on dyna-disc for core stabilization: - static sitting for 2 minutes total - mini crunches: 2 x 12, no UE support - alt marching: 2 x 10, no UE support, sig challenge for pt noted   PATIENT EDUCATION: Education details: Pt educated throughout session about proper posture and technique with exercises. Improved exercise technique, movement at target joints, use of target muscles after min to mod verbal, visual, tactile cues. Person educated: Patient Education method: Explanation, Demonstration, Tactile cues, and Verbal cues Education comprehension: verbalized understanding, returned demonstration, verbal cues required, tactile cues required, and needs further education   HOME EXERCISE PROGRAM: No updates as of 02/15/22  Access Code: [email or web address]     PT Short Term Goals       PT SHORT TERM GOAL #1   Title Independence in HEP for improved balance and strength    Baseline Issued at eval    Time 3    Period Weeks    Status New    Target Date 01/11/22       PT SHORT TERM GOAL #2   Title 5xSTS hands free <19sec    Baseline Eval: >24sec hands free    Time 4    Period Weeks    Status New    Target Date 01/18/22      PT SHORT TERM GOAL #3   Title Ankle DF MMT 5/5 bilat.    Baseline Eval: 4+/5 Right, 4/5 Left    Time 4    Period Weeks    Status New    Target Date 01/18/22              PT Long Term Goals       PT LONG TERM GOAL #1  Title 6MWT > 1258f c LRAD    Baseline July 2019: 13271f   Time 8    Period Weeks    Status New    Target Date 02/15/22      PT LONG TERM GOAL #2   Title 5xSTS <13 sec hands free    Baseline eval: >24sec hands free    Time 8    Period Weeks    Status New    Target Date 02/15/22      PT LONG TERM GOAL #3   Title Pt to demonstrate retroAMB without device >0.2555ms LOB to demonstrate improved ankle righting and control.    Baseline Eval: <0.70m17mnd c 3 LOB    Time 10    Period Weeks    Status New    Target Date 03/01/22      PT LONG TERM GOAL #4   Title FOTO Score increase >15    Baseline eval: 48    Time 12    Period Weeks    Status New    Target Date 03/15/22              Plan     Clinical Impression Statement Session time slightly limited due to late check-in. Pt is pleasant and highly motivated for today's session. Pt demonstrates improve BLE strength when preforming STS without UE support and no LOB noted. Continued progressing core stablization exercises, pt tolerate well with increase difficulty noted during seated marching on dyna-disc. Pt will benefit from further skilled PT to address impairments in order to maximize independence and safety in ADLs/IADLs and to reduce risk of falls.   Personal Factors and Comorbidities Past/Current Experience    Examination-Activity Limitations Dressing;Transfers;Bend;Locomotion Level;Stairs;Stand    Examination-Participation Restrictions Cleaning;Shop;Meal Prep;YardValla Leaverk;Laundry    Stability/Clinical Decision Making  Evolving/Moderate complexity    Rehab Potential Good    Clinical Impairments Affecting Rehab Potential (+) motivation, social support (-) age, chronicity of pain, fairly sedentary lifestyle    PT Frequency 2x / week    PT Duration 12 weeks    PT Treatment/Interventions Aquatic Therapy;Moist Heat;Functional mobility training;Dry needling;Neuromuscular re-education;Traction;Ultrasound;Iontophoresis '4mg'$ /ml Dexamethasone;ADLs/Self Care Home Management;Cryotherapy;Electrical Stimulation;Therapeutic exercise;Therapeutic activities;Patient/family education;Taping;Gait training;Stair training;DME Instruction;Balance training;Passive range of motion    PT Next Visit Plan Progress safety with mobility and LE strengthening as appropriate, continue POC   PT Home Exercise Plan Eval: seated heel raises, toe raises, and STS from elevated surface. 01/04/2022=Access Code: [email or web address]: [email or web address]    Consulted and Agree with Plan of Care Patient;Family member/caregiver    Family Member Consulted Cassidy Hernandez            This entire session was performed under direct supervision and direction of a licensed therChiropractor have personally read, edited and approve of the note as written.   NicoIzola PriceT  JeffOllen Bowl    02/15/22 11:56 AM

## 2022-02-16 ENCOUNTER — Encounter: Payer: Medicare Other | Admitting: Occupational Therapy

## 2022-02-16 ENCOUNTER — Ambulatory Visit: Payer: Medicare Other | Admitting: Speech Pathology

## 2022-02-16 ENCOUNTER — Ambulatory Visit: Payer: Medicare Other

## 2022-02-16 DIAGNOSIS — M6281 Muscle weakness (generalized): Secondary | ICD-10-CM

## 2022-02-16 DIAGNOSIS — R2681 Unsteadiness on feet: Secondary | ICD-10-CM

## 2022-02-16 DIAGNOSIS — R4701 Aphasia: Secondary | ICD-10-CM | POA: Diagnosis not present

## 2022-02-16 DIAGNOSIS — R262 Difficulty in walking, not elsewhere classified: Secondary | ICD-10-CM

## 2022-02-16 DIAGNOSIS — R471 Dysarthria and anarthria: Secondary | ICD-10-CM

## 2022-02-17 ENCOUNTER — Ambulatory Visit: Payer: Medicare Other | Admitting: Physical Therapy

## 2022-02-17 ENCOUNTER — Encounter: Payer: Medicare Other | Admitting: Speech Pathology

## 2022-02-17 ENCOUNTER — Ambulatory Visit: Payer: Medicare Other | Admitting: Speech Pathology

## 2022-02-17 DIAGNOSIS — R471 Dysarthria and anarthria: Secondary | ICD-10-CM

## 2022-02-17 DIAGNOSIS — R4701 Aphasia: Secondary | ICD-10-CM

## 2022-02-17 NOTE — Therapy (Signed)
OUTPATIENT SPEECH LANGUAGE PATHOLOGY TREATMENT NOTE   Patient Name: Cassidy Hernandez MRN: [id number] DOB:1940-09-26, 81 y.o., female Today's Date: 02/17/2022   REFERRING PROVIDER: Merrilee Seashore, NP    End of Session - 02/17/22 1121     Visit Number 18    Number of Visits 25    Date for SLP Re-Evaluation 03/16/22    Authorization Type BCBS MCR; $10 copay    Authorization - Visit Number 8    Progress Note Due on Visit 10    SLP Start Time 1100    SLP Stop Time  1200    SLP Time Calculation (min) 60 min    Activity Tolerance Patient tolerated treatment well             Past Medical History:  Diagnosis Date   Anginal pain ([place])    Aortic atherosclerosis    Cancer ([place])    Carotid artery stenosis 02/01/2010   a.) Doppler 81/15/7262: 03% LICA and 55% RICA. b.) Doppler 97/41/6384: >53% LICA and 64% RICA. c.) Doppler 10/25/16: >70% Bilater ICAs   Chronic bilateral low back pain with left-sided sciatica    CKD (chronic kidney disease), stage III (HCC)    Coronary artery disease    a.) LHC 12/24/2014 -> small LAD system; diffuse CTO of LAD. 60% RCA. LM and LCx with no sig disease. no intervention; med mgmt.   Degenerative arthritis    Diastolic dysfunction    a.) TTE 09/20/2018: EF 55%, G1DD, mild LAE and RVE, triv-mild panvalvular regurgitation.   Dysarthria    GERD (gastroesophageal reflux disease)    Hepatic steatosis    History of kidney stones    HLD (hyperlipidemia)    Hypertension    Kidney stone    Lumbar radiculopathy    Major depression in remission ([place])    PVD (peripheral vascular disease) ([place])    Renal cyst, right 06/02/2015   a.) CT 06/02/2015 --> 4.4 cm RIGHT renal mass; MRI recommended. b.) MRI 06/16/2015 --> 3.8 x 4.2 x 4.0 cm proteinaceous/hemmorhagic cyst.   Subclavian steal syndrome    a.) LHC 12/24/2014 --> tight eccentric 90% ostial stenosis with damping.   T2DM (type 2 diabetes mellitus) [facility])    Past Surgical History:  Procedure Laterality Date    ABDOMINAL HYSTERECTOMY     AUGMENTATION MAMMAPLASTY Bilateral 1980   BACK SURGERY  09/2018   BICEPT TENODESIS  06/10/2021   Procedure: BICEPS TENODESIS;  Surgeon: Corky Mull, MD;  Location: [facility];  Service: Orthopedics;;   CARDIAC CATHETERIZATION Left 12/24/2014   Procedure: LEFT HEARD CATHETERIZATION; Location: [facility]; Surgeon: Laurence Aly, MD   CHOLECYSTECTOMY     COLONOSCOPY N/A 02/11/2016   Procedure: COLONOSCOPY;  Surgeon: Manya Silvas, MD;  Location: [facility] ENDOSCOPY;  Service: Endoscopy;  Laterality: N/A;   IR CT HEAD LTD  11/16/2021   IR CT HEAD LTD  11/16/2021   IR INTRAVSC STENT CERV CAROTID W/O EMB-PROT MOD SED INC ANGIO  11/18/2021   IR PERCUTANEOUS ART THROMBECTOMY/INFUSION INTRACRANIAL INC DIAG ANGIO  11/16/2021   RADIOLOGY WITH ANESTHESIA N/A 11/16/2021   Procedure: IR WITH ANESTHESIA- CODE STROKE;  Surgeon: Radiologist, Medication, MD;  Location: [facility];  Service: Radiology;  Laterality: N/A;   REVERSE SHOULDER ARTHROPLASTY Right 06/10/2021   Procedure: REVERSE SHOULDER ARTHROPLASTY;  Surgeon: Corky Mull, MD;  Location: [facility];  Service: Orthopedics;  Laterality: Right;   Patient Active Problem List   Diagnosis Date Noted   Acute ischemic left MCA stroke ([place]) 11/16/2021  Middle cerebral artery embolism, 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   Status post reverse arthroplasty of shoulder, right 06/10/2021    ONSET DATE: 11/16/21   REFERRING DIAG: L MCA CVA   HPI: Pt is a 81 y.o. 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 Patient is s/p thrombectomy with left ICA stent placement 11/16/21. She completed inpatient rehabilitation at [facility] 11/25/21-12/07/21 and was discharged home with family. Patient known to this clinic from prior course of voice therapy in 2018. Pt with documented hx of voice and speech changes (dysarthria) per videostroboscopy 12/26/17; did not attend therapy.   THERAPY DIAG:  Aphasia  Dysarthria and anarthria  Rationale for Evaluation and  Treatment Rehabilitation  SUBJECTIVE: Pt reports enjoying doing TalkPath Therapy activities  Pt accompanied by: caregiver Ernestine  PAIN: No Are you having pain? No     OBJECTIVE:   TODAY'S TREATMENT: SLP facilitated 10 min conversation; Pt observed with hesitation and anomia, required min cues for word retrieval. SLP targeted auditory comprehension with a TalkPath Therapy activity of increased difficulty with more abstract questions and reduced visual support; 88% acc.  Facilitated reading comprehension activity with 2x small paragraphs and 10x "yes, no" questions. Pt initially required mod cues fading to min cues to find necessary details in paragraphs; self-corrected 1x. Targeted wordfinding and error awareness; Pt filled in blanks to complete simple sentences in a paragraph story with mod cues fading to min cues. Facilitated additional wordfinding, error awareness and sentence formulation activity. Pt generated simple sentences with mod-max visual and verbal cues.    PATIENT EDUCATION: Education details: home tasks, take breaks when necessary Person educated: Patient Education method: Explanation, Demonstration, and Verbal cues Education comprehension: verbalized understanding and needs further education   SLP Short Term Goals - 01/24/22 1439       SLP SHORT TERM GOAL #1   Title Pt will communicate emergency information 100% accuracy using visual aid if necessary.    Baseline 100% 01/24/22    Time 6    Period Weeks    Status Achieved    Target Date 01/27/22      SLP SHORT TERM GOAL #2   Title Pt will generate at least 4 descriptors of target word 80% of the time using semantic feature analysis to improve abilities in wordfinding and resolving communication breakdowns.    Baseline 85% 01/24/22    Time 6    Period Weeks    Status Achieved    Target Date 01/27/22      SLP SHORT TERM GOAL #3   Title Patient will request repetition/rephrasing to aid auditory comprehension of  mod complex information (multi-step commands, conversation) in 80% of opportunities.    Baseline 85% 01/24/22    Time 6    Period Weeks    Status Achieved    Target Date 01/27/22      SLP SHORT TERM GOAL #4   Title Pt will read and reply to simple texts/emails 80% accuracy with use of accessibility features as necessary.    Baseline 01/24/22, extended time, requires occasional cues, continues to require cuing with accessibility features    Time 6    Period Weeks    Status Partially Met    Target Date 01/27/22              SLP Long Term Goals - 01/24/22 1440       SLP LONG TERM GOAL #1   Title Pt will engage in 5-8 minutes simple-mod complex conversation re: topic  of interest with supported conversation, aphasia compensations.    Time 12    Period Weeks    Status On-going      SLP LONG TERM GOAL #2   Title Pt will ID and attempt repair of communication breakdowns >80% of the time in session.    Time 12    Period Weeks    Status On-going      SLP LONG TERM GOAL #3   Title Pt will demonstrate reading comprehension of mod complex 8-10 paragraphs by generating summary or answering questions >80% accuracy.    Time 12    Period Weeks    Status On-going              Plan - 02/01/22 1121     Clinical Impression Statement Pt continues to present with moderate aphasia, with expressive deficits greater than receptive deficits. Pt's auditory and reading comprehension noted to improve; required additional cues in reverse "yes, no" questions. Pt reports having independently written checks; continuing to require additional support when constructing sentences, versus filling in simple incomplete sentences. Pt reports enjoying cognitive activities Pt continues to improve written language and error awareness at phrase level. Pt is progressing toward goals and benefits from continued skilled ST intervention per plan of care.    Speech Therapy Frequency 2x / week    Duration 2 weeks     Treatment/Interventions SLP instruction and feedback;Multimodal communcation approach;Patient/family education;Functional tasks;Cueing hierarchy;Compensatory techniques;Compensatory strategies    Potential to Achieve Goals Good    Potential Considerations Family/community support    SLP Home Exercise Plan TalkPath Therapy sign-up and tasks provided last session.    Consulted and Agree with Plan of Care Patient              Seth Bake 02/17/2022, 2:34 PM  I agree with the following treatment note after reviewing documentation.  This session was performed under the supervision of a licensed clinician.   Deneise Lever, MS, Actor

## 2022-02-17 NOTE — Therapy (Signed)
OUTPATIENT SPEECH LANGUAGE PATHOLOGY TREATMENT NOTE   Patient Name: Cassidy Hernandez MRN: [id number] DOB:1941-07-10, 81 y.o., female Today's Date: 02/17/2022   REFERRING PROVIDER: Merrilee Seashore, NP    End of Session - 02/16/22 0837     Visit Number 17    Number of Visits 25    Date for SLP Re-Evaluation 03/16/22    Authorization Type BCBS MCR; $10 copay    Authorization - Visit Number 7    Progress Note Due on Visit 10    Activity Tolerance Patient tolerated treatment well             Past Medical History:  Diagnosis Date   Anginal pain ([place])    Aortic atherosclerosis    Cancer ([place])    Carotid artery stenosis 02/01/2010   a.) Doppler 40/81/4481: 85% LICA and 63% RICA. b.) Doppler 14/97/0263: >78% LICA and 58% RICA. c.) Doppler 10/25/16: >70% Bilater ICAs   Chronic bilateral low back pain with left-sided sciatica    CKD (chronic kidney disease), stage III (HCC)    Coronary artery disease    a.) LHC 12/24/2014 -> small LAD system; diffuse CTO of LAD. 60% RCA. LM and LCx with no sig disease. no intervention; med mgmt.   Degenerative arthritis    Diastolic dysfunction    a.) TTE 09/20/2018: EF 55%, G1DD, mild LAE and RVE, triv-mild panvalvular regurgitation.   Dysarthria    GERD (gastroesophageal reflux disease)    Hepatic steatosis    History of kidney stones    HLD (hyperlipidemia)    Hypertension    Kidney stone    Lumbar radiculopathy    Major depression in remission ([place])    PVD (peripheral vascular disease) ([place])    Renal cyst, right 06/02/2015   a.) CT 06/02/2015 --> 4.4 cm RIGHT renal mass; MRI recommended. b.) MRI 06/16/2015 --> 3.8 x 4.2 x 4.0 cm proteinaceous/hemmorhagic cyst.   Subclavian steal syndrome    a.) LHC 12/24/2014 --> tight eccentric 90% ostial stenosis with damping.   T2DM (type 2 diabetes mellitus) [facility])    Past Surgical History:  Procedure Laterality Date   ABDOMINAL HYSTERECTOMY     AUGMENTATION MAMMAPLASTY Bilateral 1980   BACK SURGERY   09/2018   BICEPT TENODESIS  06/10/2021   Procedure: BICEPS TENODESIS;  Surgeon: Corky Mull, MD;  Location: [facility];  Service: Orthopedics;;   CARDIAC CATHETERIZATION Left 12/24/2014   Procedure: LEFT HEARD CATHETERIZATION; Location: [facility]; Surgeon: Laurence Aly, MD   CHOLECYSTECTOMY     COLONOSCOPY N/A 02/11/2016   Procedure: COLONOSCOPY;  Surgeon: Manya Silvas, MD;  Location: [facility] ENDOSCOPY;  Service: Endoscopy;  Laterality: N/A;   IR CT HEAD LTD  11/16/2021   IR CT HEAD LTD  11/16/2021   IR INTRAVSC STENT CERV CAROTID W/O EMB-PROT MOD SED INC ANGIO  11/18/2021   IR PERCUTANEOUS ART THROMBECTOMY/INFUSION INTRACRANIAL INC DIAG ANGIO  11/16/2021   RADIOLOGY WITH ANESTHESIA N/A 11/16/2021   Procedure: IR WITH ANESTHESIA- CODE STROKE;  Surgeon: Radiologist, Medication, MD;  Location: [facility];  Service: Radiology;  Laterality: N/A;   REVERSE SHOULDER ARTHROPLASTY Right 06/10/2021   Procedure: REVERSE SHOULDER ARTHROPLASTY;  Surgeon: Corky Mull, MD;  Location: [facility];  Service: Orthopedics;  Laterality: Right;   Patient Active Problem List   Diagnosis Date Noted   Acute ischemic left MCA stroke ([place]) 11/16/2021   Middle cerebral artery embolism, 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   Status post reverse arthroplasty of shoulder, right 06/10/2021    ONSET DATE: 11/16/21  REFERRING DIAG: L MCA CVA   HPI: Pt is a 81 y.o. 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 Patient is s/p thrombectomy with left ICA stent placement 11/16/21. She completed inpatient rehabilitation at [facility] 11/25/21-12/07/21 and was discharged home with family. Patient known to this clinic from prior course of voice therapy in 2018. Pt with documented hx of voice and speech changes (dysarthria) per videostroboscopy 12/26/17; did not attend therapy.   THERAPY DIAG:  Aphasia  Dysarthria and anarthria  Rationale for Evaluation and Treatment Rehabilitation  SUBJECTIVE: Pt reports HEP TalkPath activities took her a long  time, made her think.  Pt accompanied by: brother Konrad Dolores)  PAIN: No Are you having pain? No     OBJECTIVE:   TODAY'S TREATMENT: SLP and graduate clinician facilitated simple conversations (3-5 minutes) re: siblings, with pt requiring occasional min cues to provide description when anomia or articulatory errors occurred. SLP reviewed HEP with Pt prompting Pt to re-read sentences and self correct with min-mod cues 3x; "Finish the sentence" 80% acc. Facilitated wordfinding and error awareness with sentence completion task; Pt completed open-ended sentences to complete 2 short paragraphs with initial mod cues fading to rare min cues with 90% acc. SLP supported Pt in completing TalkPath Therapy activity targeting auditory comprehension with 90% acc; pt required rare min cues to slow down. Facilitated an additional wordfinding and error awareness activity with sentence/question completion task in wedding scripting; min-mod cues with 85% acc. SLP provided Pt with cough log after noting some difficulty swallowing liquids; plan to review Pt's written descriptions on cough log and potentially follow up with swallow eval.   PATIENT EDUCATION: Education details: potential swallow and voice differences after stroke Person educated: Patient Education method: Explanation, Demonstration, and Verbal cues Education comprehension: verbalized understanding and needs further education   SLP Short Term Goals - 01/24/22 1439       SLP SHORT TERM GOAL #1   Title Pt will communicate emergency information 100% accuracy using visual aid if necessary.    Baseline 100% 01/24/22    Time 6    Period Weeks    Status Achieved    Target Date 01/27/22      SLP SHORT TERM GOAL #2   Title Pt will generate at least 4 descriptors of target word 80% of the time using semantic feature analysis to improve abilities in wordfinding and resolving communication breakdowns.    Baseline 85% 01/24/22    Time 6    Period Weeks     Status Achieved    Target Date 01/27/22      SLP SHORT TERM GOAL #3   Title Patient will request repetition/rephrasing to aid auditory comprehension of mod complex information (multi-step commands, conversation) in 80% of opportunities.    Baseline 85% 01/24/22    Time 6    Period Weeks    Status Achieved    Target Date 01/27/22      SLP SHORT TERM GOAL #4   Title Pt will read and reply to simple texts/emails 80% accuracy with use of accessibility features as necessary.    Baseline 01/24/22, extended time, requires occasional cues, continues to require cuing with accessibility features    Time 6    Period Weeks    Status Partially Met    Target Date 01/27/22              SLP Long Term Goals - 01/24/22 1440       SLP LONG  TERM GOAL #1   Title Pt will engage in 5-8 minutes simple-mod complex conversation re: topic of interest with supported conversation, aphasia compensations.    Time 12    Period Weeks    Status On-going      SLP LONG TERM GOAL #2   Title Pt will ID and attempt repair of communication breakdowns >80% of the time in session.    Time 12    Period Weeks    Status On-going      SLP LONG TERM GOAL #3   Title Pt will demonstrate reading comprehension of mod complex 8-10 paragraphs by generating summary or answering questions >80% accuracy.    Time 12    Period Weeks    Status On-going              Plan - 02/01/22 1121     Clinical Impression Statement Pt continues to present with moderate aphasia, with expressive deficits greater than receptive deficits. Pt noted to require additional cues when completing complex/abstracts sentences, versus simple sentences with clear context. Pt continues to improve written language and error awareness at phrase level. Pt continues to improve verbal output and benefits from slowing rate and using multimodal techniques such as visual aids. Pt is progressing toward goals and benefits from continued skilled ST intervention  per plan of care. Coughing when sipping thin liquids noted today; will consider reaching out to MD for clinical swallow evaluation orders should pt report additional difficulties.    Speech Therapy Frequency 2x / week    Duration 2 weeks    Treatment/Interventions SLP instruction and feedback;Multimodal communcation approach;Patient/family education;Functional tasks;Cueing hierarchy;Compensatory techniques;Compensatory strategies    Potential to Achieve Goals Good    Potential Considerations Family/community support    SLP Home Exercise Plan TalkPath Therapy sign-up and tasks provided last session.    Consulted and Agree with Plan of Care Patient              Seth Bake 02/17/2022, 8:42 AM

## 2022-02-21 ENCOUNTER — Encounter: Payer: Medicare Other | Admitting: Occupational Therapy

## 2022-02-21 ENCOUNTER — Encounter: Payer: Medicare Other | Admitting: Speech Pathology

## 2022-02-21 ENCOUNTER — Ambulatory Visit: Payer: Medicare Other

## 2022-02-22 ENCOUNTER — Ambulatory Visit: Payer: Medicare Other

## 2022-02-22 ENCOUNTER — Encounter: Payer: Medicare Other | Admitting: Occupational Therapy

## 2022-02-22 ENCOUNTER — Ambulatory Visit: Payer: Medicare Other | Admitting: Speech Pathology

## 2022-02-22 DIAGNOSIS — M6281 Muscle weakness (generalized): Secondary | ICD-10-CM

## 2022-02-22 DIAGNOSIS — R4701 Aphasia: Secondary | ICD-10-CM

## 2022-02-22 DIAGNOSIS — R262 Difficulty in walking, not elsewhere classified: Secondary | ICD-10-CM

## 2022-02-22 DIAGNOSIS — R2681 Unsteadiness on feet: Secondary | ICD-10-CM

## 2022-02-22 DIAGNOSIS — R278 Other lack of coordination: Secondary | ICD-10-CM

## 2022-02-22 DIAGNOSIS — R2689 Other abnormalities of gait and mobility: Secondary | ICD-10-CM

## 2022-02-22 DIAGNOSIS — R269 Unspecified abnormalities of gait and mobility: Secondary | ICD-10-CM

## 2022-02-22 NOTE — Therapy (Unsigned)
OUTPATIENT SPEECH LANGUAGE PATHOLOGY TREATMENT NOTE   Patient Name: Cassidy Hernandez MRN: [id number] DOB:August 19, 1940, 81 y.o., female Today's Date: 02/22/2022   REFERRING PROVIDER: Merrilee Seashore, NP    End of Session - 02/22/22 1448     Visit Number 19    Number of Visits 25    Date for SLP Re-Evaluation 03/16/22    Authorization Type BCBS MCR; $10 copay    Authorization - Visit Number 9    Progress Note Due on Visit 10    SLP Start Time 1300    SLP Stop Time  1400    SLP Time Calculation (min) 60 min    Activity Tolerance Patient tolerated treatment well   cognitive fatigue last 5 mins of session            Past Medical History:  Diagnosis Date   Anginal pain ([place])    Aortic atherosclerosis    Cancer ([place])    Carotid artery stenosis 02/01/2010   a.) Doppler 24/82/5003: 70% LICA and 48% RICA. b.) Doppler 88/91/6945: >03% LICA and 88% RICA. c.) Doppler 10/25/16: >70% Bilater ICAs   Chronic bilateral low back pain with left-sided sciatica    CKD (chronic kidney disease), stage III (HCC)    Coronary artery disease    a.) LHC 12/24/2014 -> small LAD system; diffuse CTO of LAD. 60% RCA. LM and LCx with no sig disease. no intervention; med mgmt.   Degenerative arthritis    Diastolic dysfunction    a.) TTE 09/20/2018: EF 55%, G1DD, mild LAE and RVE, triv-mild panvalvular regurgitation.   Dysarthria    GERD (gastroesophageal reflux disease)    Hepatic steatosis    History of kidney stones    HLD (hyperlipidemia)    Hypertension    Kidney stone    Lumbar radiculopathy    Major depression in remission ([place])    PVD (peripheral vascular disease) ([place])    Renal cyst, right 06/02/2015   a.) CT 06/02/2015 --> 4.4 cm RIGHT renal mass; MRI recommended. b.) MRI 06/16/2015 --> 3.8 x 4.2 x 4.0 cm proteinaceous/hemmorhagic cyst.   Subclavian steal syndrome    a.) LHC 12/24/2014 --> tight eccentric 90% ostial stenosis with damping.   T2DM (type 2 diabetes mellitus) [facility])    Past Surgical  History:  Procedure Laterality Date   ABDOMINAL HYSTERECTOMY     AUGMENTATION MAMMAPLASTY Bilateral 1980   BACK SURGERY  09/2018   BICEPT TENODESIS  06/10/2021   Procedure: BICEPS TENODESIS;  Surgeon: Corky Mull, MD;  Location: [place] [facility];  Service: Orthopedics;;   CARDIAC CATHETERIZATION Left 12/24/2014   Procedure: LEFT HEARD CATHETERIZATION; Location: [facility]; Surgeon: Laurence Aly, MD   CHOLECYSTECTOMY     COLONOSCOPY N/A 02/11/2016   Procedure: COLONOSCOPY;  Surgeon: Manya Silvas, MD;  Location: [facility];  Service: Endoscopy;  Laterality: N/A;   IR CT HEAD LTD  11/16/2021   IR CT HEAD LTD  11/16/2021   IR INTRAVSC STENT CERV CAROTID W/O EMB-PROT MOD SED INC ANGIO  11/18/2021   IR PERCUTANEOUS ART THROMBECTOMY/INFUSION INTRACRANIAL INC DIAG ANGIO  11/16/2021   RADIOLOGY WITH ANESTHESIA N/A 11/16/2021   Procedure: IR WITH ANESTHESIA- CODE STROKE;  Surgeon: Radiologist, Medication, MD;  Location: [facility];  Service: Radiology;  Laterality: N/A;   REVERSE SHOULDER ARTHROPLASTY Right 06/10/2021   Procedure: REVERSE SHOULDER ARTHROPLASTY;  Surgeon: Corky Mull, MD;  Location: [facility];  Service: Orthopedics;  Laterality: Right;   Patient Active Problem List   Diagnosis Date Noted  Acute ischemic left MCA stroke ([place]) 11/16/2021   Middle cerebral artery embolism, 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   Status post reverse arthroplasty of shoulder, right 06/10/2021    ONSET DATE: 11/16/21   REFERRING DIAG: L MCA CVA   HPI: Pt is a 81 y.o. 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 Patient is s/p thrombectomy with left ICA stent placement 11/16/21. She completed inpatient rehabilitation at [facility] 11/25/21-12/07/21 and was discharged home with family. Patient known to this clinic from prior course of voice therapy in 2018. Pt with documented hx of voice and speech changes (dysarthria) per videostroboscopy 12/26/17; did not attend therapy.   THERAPY DIAG:  Aphasia  Rationale for  Evaluation and Treatment Rehabilitation  SUBJECTIVE: Pt reports having a fall at home since last session, not injured  Pt accompanied by: caregiver Ernestine  PAIN: No Are you having pain? No     OBJECTIVE:   TODAY'S TREATMENT: SLP facilitated 20 min conversation; Pt observed with hesitation and anomia 1x, required min cues for word retrieval. Pt noted to use slow rate and self-correct paraphasias on several occasions. Targeted wordfinding and error awareness by reviewing sentence worksheet done at home. Pt re-read short sentences self-corrected 2/3 times with min cues. Modified writing activity from last session to include sentence starters; Pt filled in blanks to generate sentences that complete short paragraphs with 75% acc self correcting 6/8 times with mod cues.   PATIENT EDUCATION: Education details: progress with writing tasks  Person educated: Patient Education method: Explanation, Demonstration, and Verbal cues Education comprehension: verbalized understanding and needs further education   SLP Short Term Goals - 01/24/22 1439       SLP SHORT TERM GOAL #1   Title Pt will communicate emergency information 100% accuracy using visual aid if necessary.    Baseline 100% 01/24/22    Time 6    Period Weeks    Status Achieved    Target Date 01/27/22      SLP SHORT TERM GOAL #2   Title Pt will generate at least 4 descriptors of target word 80% of the time using semantic feature analysis to improve abilities in wordfinding and resolving communication breakdowns.    Baseline 85% 01/24/22    Time 6    Period Weeks    Status Achieved    Target Date 01/27/22      SLP SHORT TERM GOAL #3   Title Patient will request repetition/rephrasing to aid auditory comprehension of mod complex information (multi-step commands, conversation) in 80% of opportunities.    Baseline 85% 01/24/22    Time 6    Period Weeks    Status Achieved    Target Date 01/27/22      SLP SHORT TERM GOAL #4    Title Pt will read and reply to simple texts/emails 80% accuracy with use of accessibility features as necessary.    Baseline 01/24/22, extended time, requires occasional cues, continues to require cuing with accessibility features    Time 6    Period Weeks    Status Partially Met    Target Date 01/27/22              SLP Long Term Goals - 01/24/22 1440       SLP LONG TERM GOAL #1   Title Pt will engage in 5-8 minutes simple-mod complex conversation re: topic of interest with supported conversation, aphasia compensations.    Time 12    Period Weeks    Status On-going  SLP LONG TERM GOAL #2   Title Pt will ID and attempt repair of communication breakdowns >80% of the time in session.    Time 12    Period Weeks    Status On-going      SLP LONG TERM GOAL #3   Title Pt will demonstrate reading comprehension of mod complex 8-10 paragraphs by generating summary or answering questions >80% accuracy.    Time 12    Period Weeks    Status On-going              Plan - 02/01/22 1121     Clinical Impression Statement Pt continues to present with moderate aphasia, with expressive deficits greater than receptive deficits. Pt's expressive language noted to improve requiring less cues to implement aphasia compensations. Pt continues to improve written language and error awareness at phrase level. Pt's cognitive fatigue during the last writing activity noted to impact awareness and accuracy. Plan for next time to alternate complexity of activity to reduce cognitive fatigue. Pt is progressing toward goals and benefits from continued skilled ST intervention per plan of care.    Speech Therapy Frequency 2x / week    Duration 2 weeks    Treatment/Interventions SLP instruction and feedback;Multimodal communcation approach;Patient/family education;Functional tasks;Cueing hierarchy;Compensatory techniques;Compensatory strategies    Potential to Achieve Goals Good    Potential Considerations  Family/community support    SLP Home Exercise Plan [facility] sign-up and tasks provided last session.    Consulted and Agree with Plan of Care Patient              Seth Bake 02/22/2022, 2:53 PM

## 2022-02-22 NOTE — Therapy (Signed)
OUTPATIENT PHYSICAL THERAPY TREATMENT NOTE   Patient Name: Cassidy Hernandez MRN: [id number] DOB:[birth year], 81 y.o., female Today's Date: 02/22/2022  PCP: Kirk Ruths, MD REFERRING PROVIDER: Rayann Heman, NP   PT End of Session - 02/22/22 1743     Visit Number 15    Number of Visits 24    Date for PT Re-Evaluation 03/15/22    Authorization Type BCBS Medicare    Authorization Time Period 12/21/21-03/15/22    Progress Note Due on Visit 10    PT Start Time 1430    PT Stop Time 1514    PT Time Calculation (min) 44 min    Equipment Utilized During Treatment Gait belt    Activity Tolerance Patient tolerated treatment well    Behavior During Therapy [facility] for tasks assessed/performed                Past Medical History:  Diagnosis Date   Anginal pain ([place])    Aortic atherosclerosis    Cancer ([place])    Carotid artery stenosis 02/01/2010   a.) Doppler 02/40/9735: 32% LICA and 99% RICA. b.) Doppler 24/26/8341: >96% LICA and 22% RICA. c.) Doppler 10/25/16: >70% Bilater ICAs   Chronic bilateral low back pain with left-sided sciatica    CKD (chronic kidney disease), stage III (HCC)    Coronary artery disease    a.) LHC 12/24/2014 -> small LAD system; diffuse CTO of LAD. 60% RCA. LM and LCx with no sig disease. no intervention; med mgmt.   Degenerative arthritis    Diastolic dysfunction    a.) TTE 09/20/2018: EF 55%, G1DD, mild LAE and RVE, triv-mild panvalvular regurgitation.   Dysarthria    GERD (gastroesophageal reflux disease)    Hepatic steatosis    History of kidney stones    HLD (hyperlipidemia)    Hypertension    Kidney stone    Lumbar radiculopathy    Major depression in remission ([place])    PVD (peripheral vascular disease) ([place])    Renal cyst, right 06/02/2015   a.) CT 06/02/2015 --> 4.4 cm RIGHT renal mass; MRI recommended. b.) MRI 06/16/2015 --> 3.8 x 4.2 x 4.0 cm proteinaceous/hemmorhagic cyst.   Subclavian steal syndrome    a.) LHC 12/24/2014 --> tight  eccentric 90% ostial stenosis with damping.   T2DM (type 2 diabetes mellitus) [facility])    Past Surgical History:  Procedure Laterality Date   ABDOMINAL HYSTERECTOMY     AUGMENTATION MAMMAPLASTY Bilateral 1980   BACK SURGERY  09/2018   BICEPT TENODESIS  06/10/2021   Procedure: BICEPS TENODESIS;  Surgeon: Corky Mull, MD;  Location: [place] [facility];  Service: Orthopedics;;   CARDIAC CATHETERIZATION Left 12/24/2014   Procedure: LEFT HEARD CATHETERIZATION; Location: [facility]; Surgeon: Laurence Aly, MD   CHOLECYSTECTOMY     COLONOSCOPY N/A 02/11/2016   Procedure: COLONOSCOPY;  Surgeon: Manya Silvas, MD;  Location: [facility] END[facility]COPY;  Service: Endoscopy;  Laterality: N/A;   IR CT HEAD LTD  11/16/2021   IR CT HEAD LTD  11/16/2021   IR INTRAVSC STENT CERV CAROTID W/O EMB-PROT MOD SED INC ANGIO  11/18/2021   IR PERCUTANEOUS ART THROMBECTOMY/INFUSION INTRACRANIAL INC DIAG ANGIO  11/16/2021   RADIOLOGY WITH ANESTHESIA N/A 11/16/2021   Procedure: IR WITH ANESTHESIA- CODE STROKE;  Surgeon: Radiologist, Medication, MD;  Location: [place];  Service: Radiology;  Laterality: N/A;   REVERSE SHOULDER ARTHROPLASTY Right 06/10/2021   Procedure: REVERSE SHOULDER ARTHROPLASTY;  Surgeon: Corky Mull, MD;  Location: [facility];  Service: Orthopedics;  Laterality: Right;   Patient Active Problem List   Diagnosis Date Noted   Acute ischemic left MCA stroke ([place]) 11/16/2021   Middle cerebral artery embolism, 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   Status post reverse arthroplasty of shoulder, right 06/10/2021    REFERRING DIAG: Y40.347 (ICD-10-CM) - Cerebral infarction due to unspecified occlusion or stenosis of left middle cerebral artery  THERAPY DIAG:  Difficulty in walking, not elsewhere classified  Muscle weakness (generalized)  Unsteadiness on feet  Other lack of coordination  Abnormality of gait and mobility  Other abnormalities of gait and mobility  Rationale for Evaluation and Treatment Rehabilitation  PERTINENT HISTORY: Pt Recent  L MCA, s/p thrombectomy and ICA stenting. Pt with speech deficits. Pt has been somewhat unsteady with a few falls falling backward however DTR reports this is not new, but has been ongoing. 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  PRECAUTIONS: N/A  SUBJECTIVE: Pt notes that she had a fall last night and fell on her butt.  Pt reports no soreness or pain upon arrival from fall.  Pt notes she was in her bedroom when it happened, and she just fell backwards.  PAIN:  Are you having pain? No     TODAY'S TREATMENT:  INTERVENTIONS:    BP= 154/71  mmHg Right UE sitting    Therapeutic Exercises:   STS from standard height chair, no UE support, 2x10 Seated marches with GTB resistance applied around knees, 2x15 each LE Seated hip abduction with GTB resistance applied around knees, 2x15 each LE Seated hamstring curls with GTB resistance applied at distal ankle, 2x15 each LE Seated LAQ with 4# AW, 2x15 Seated heel raises with 4# AQ, 2x15   Education provided throughout session via VC/TC and demonstration to facilitate movement at target joints and correct muscle activation for all testing and exercises performed.      PATIENT EDUCATION: Education details: POC, Economist Person educated: Patient Education method: Explanation, Demonstration, Tactile cues, and Verbal cues Education comprehension: verbalized understanding, returned demonstration, verbal cues required, tactile cues required, and needs further education   HOME EXERCISE PROGRAM: Access Code: [email or web address]     PT Short Term Goals       PT SHORT TERM GOAL #1   Title Independence in HEP for improved balance and strength    Baseline Issued at eval    Time 3    Period Weeks    Status New    Target Date 01/11/22      PT SHORT TERM GOAL #2   Title 5xSTS hands free <19sec    Baseline Eval: >24sec hands free    Time 4    Period Weeks    Status New    Target Date  01/18/22      PT SHORT TERM GOAL #3   Title Ankle DF MMT 5/5 bilat.    Baseline Eval: 4+/5 Right, 4/5 Left    Time 4    Period Weeks    Status New    Target Date 01/18/22              PT Long Term Goals       PT LONG TERM GOAL #1   Title 6MWT > 1257f c LRAD    Baseline July 2019: 13238f   Time 8    Period Weeks    Status New    Target Date 02/15/22      PT LONG TERM GOAL #2  Title 5xSTS <13 sec hands free    Baseline eval: >24sec hands free    Time 8    Period Weeks    Status New    Target Date 02/15/22      PT LONG TERM GOAL #3   Title Pt to demonstrate retroAMB without device >0.38ms s LOB to demonstrate improved ankle righting and control.    Baseline Eval: <0.056m and c 3 LOB    Time 10    Period Weeks    Status New    Target Date 03/01/22      PT LONG TERM GOAL #4   Title FOTO Score increase >15    Baseline eval: 48    Time 12    Period Weeks    Status New    Target Date 03/15/22              Plan     Clinical Impression Statement Pt put forth good effort throughout session today and noted the exercises to be challenging.  Pt required verbal cuing at times for proper form, however performs well with good carryover after instructions given.  Pt to continue to benefit from LE strengthening in order to improve mobility and reduce falls risk.    Personal Factors and Comorbidities Past/Current Experience    Examination-Activity Limitations Dressing;Transfers;Bend;Locomotion Level;Stairs;Stand    Examination-Participation Restrictions Cleaning;Shop;Meal Prep;[place];Laundry    Stability/Clinical Decision Making Evolving/Moderate complexity    Rehab Potential Good    Clinical Impairments Affecting Rehab Potential (+) motivation, social support (-) age, chronicity of pain, fairly sedentary lifestyle    PT Frequency 2x / week    PT Duration 12 weeks    PT Treatment/Interventions Aquatic Therapy;Moist Heat;Functional mobility training;Dry  needling;Neuromuscular re-education;Traction;Ultrasound;Iontophoresis '4mg'$ /ml Dexamethasone;ADLs/Self Care Home Management;Cryotherapy;Electrical Stimulation;Therapeutic exercise;Therapeutic activities;Patient/family education;Taping;Gait training;Stair training;DME Instruction;Balance training;Passive range of motion    PT Next Visit Plan Progress safety with mobility and LE strengthening as appropriate.    PT Home Exercise Plan Eval: seated heel raises, toe raises, and STS from elevated surface. 01/04/2022=Access Code: [email or web address]: [email or web address]    Consulted and Agree with Plan of Care Patient;Family member/caregiver    Family Member Consulted [place] 02/22/22, 5:46 PM

## 2022-02-23 ENCOUNTER — Ambulatory Visit: Payer: Medicare Other

## 2022-02-23 ENCOUNTER — Ambulatory Visit: Payer: Medicare Other | Admitting: Speech Pathology

## 2022-02-23 ENCOUNTER — Encounter: Payer: Medicare Other | Admitting: Occupational Therapy

## 2022-02-23 DIAGNOSIS — R262 Difficulty in walking, not elsewhere classified: Secondary | ICD-10-CM

## 2022-02-23 DIAGNOSIS — R471 Dysarthria and anarthria: Secondary | ICD-10-CM

## 2022-02-23 DIAGNOSIS — R4701 Aphasia: Secondary | ICD-10-CM | POA: Diagnosis not present

## 2022-02-23 DIAGNOSIS — R269 Unspecified abnormalities of gait and mobility: Secondary | ICD-10-CM

## 2022-02-23 DIAGNOSIS — M6281 Muscle weakness (generalized): Secondary | ICD-10-CM

## 2022-02-23 DIAGNOSIS — R2681 Unsteadiness on feet: Secondary | ICD-10-CM

## 2022-02-23 NOTE — Therapy (Signed)
OUTPATIENT PHYSICAL THERAPY TREATMENT NOTE   Patient Name: Cassidy Hernandez MRN: [id number] DOB:Dec 05, 1940, 81 y.o., female Today's Date: 02/23/2022  PCP: Kirk Ruths, MD REFERRING PROVIDER: Rayann Heman, NP   PT End of Session - 02/23/22 1352     Visit Number 16    Number of Visits 24    Date for PT Re-Evaluation 03/15/22    Authorization Type BCBS Medicare    Authorization Time Period 12/21/21-03/15/22    Progress Note Due on Visit 10    PT Start Time 1352    PT Stop Time 1430    PT Time Calculation (min) 38 min    Equipment Utilized During Treatment Gait belt    Activity Tolerance Patient tolerated treatment well    Behavior During Therapy [facility] for tasks assessed/performed                Past Medical History:  Diagnosis Date   Anginal pain ([place])    Aortic atherosclerosis    Cancer ([place])    Carotid artery stenosis 02/01/2010   a.) Doppler 43/32/9518: 84% LICA and 16% RICA. b.) Doppler 60/63/0160: >10% LICA and 93% RICA. c.) Doppler 10/25/16: >70% Bilater ICAs   Chronic bilateral low back pain with left-sided sciatica    CKD (chronic kidney disease), stage III ([place])    Coronary artery disease    a.) LHC 12/24/2014 -> small LAD system; diffuse CTO of LAD. 60% RCA. LM and LCx with no sig disease. no intervention; med mgmt.   Degenerative arthritis    Diastolic dysfunction    a.) TTE 09/20/2018: EF 55%, G1DD, mild LAE and RVE, triv-mild panvalvular regurgitation.   Dysarthria    GERD (gastroesophageal reflux disease)    Hepatic steatosis    History of kidney stones    HLD (hyperlipidemia)    Hypertension    Kidney stone    Lumbar radiculopathy    Major depression in remission ([place])    PVD (peripheral vascular disease) ([place])    Renal cyst, right 06/02/2015   a.) CT 06/02/2015 --> 4.4 cm RIGHT renal mass; MRI recommended. b.) MRI 06/16/2015 --> 3.8 x 4.2 x 4.0 cm proteinaceous/hemmorhagic cyst.   Subclavian steal syndrome    a.) LHC 12/24/2014 --> tight  eccentric 90% ostial stenosis with damping.   T2DM (type 2 diabetes mellitus) [facility])    Past Surgical History:  Procedure Laterality Date   ABDOMINAL HYSTERECTOMY     AUGMENTATION MAMMAPLASTY Bilateral 1980   BACK SURGERY  09/2018   BICEPT TENODESIS  06/10/2021   Procedure: BICEPS TENODESIS;  Surgeon: Corky Mull, MD;  Location: [facility];  Service: Orthopedics;;   CARDIAC CATHETERIZATION Left 12/24/2014   Procedure: LEFT HEARD CATHETERIZATION; Location: [facility]; Surgeon: Laurence Aly, MD   CHOLECYSTECTOMY     COLONOSCOPY N/A 02/11/2016   Procedure: COLONOSCOPY;  Surgeon: Manya Silvas, MD;  Location: [facility] ENDOSCOPY;  Service: Endoscopy;  Laterality: N/A;   IR CT HEAD LTD  11/16/2021   IR CT HEAD LTD  11/16/2021   IR INTRAVSC STENT CERV CAROTID W/O EMB-PROT MOD SED INC ANGIO  11/18/2021   IR PERCUTANEOUS ART THROMBECTOMY/INFUSION INTRACRANIAL INC DIAG ANGIO  11/16/2021   RADIOLOGY WITH ANESTHESIA N/A 11/16/2021   Procedure: IR WITH ANESTHESIA- CODE STROKE;  Surgeon: Radiologist, Medication, MD;  Location: [facility];  Service: Radiology;  Laterality: N/A;   REVERSE SHOULDER ARTHROPLASTY Right 06/10/2021   Procedure: REVERSE SHOULDER ARTHROPLASTY;  Surgeon: Corky Mull, MD;  Location: [facility];  Service: Orthopedics;  Laterality: Right;   Patient Active Problem List   Diagnosis Date Noted   Acute ischemic left MCA stroke ([place]) 11/16/2021   Middle cerebral artery embolism, 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   Status post reverse arthroplasty of shoulder, right 06/10/2021    REFERRING DIAG: L24.401 (ICD-10-CM) - Cerebral infarction due to unspecified occlusion or stenosis of left middle cerebral artery  THERAPY DIAG:  Difficulty in walking, not elsewhere classified  Muscle weakness (generalized)  Unsteadiness on feet  Abnormality of gait and mobility  Rationale for Evaluation and Treatment Rehabilitation  PERTINENT HISTORY: Pt Recent L MCA, s/p thrombectomy and ICA stenting. Pt with speech deficits. Pt has  been somewhat unsteady with a few falls falling backward however DTR reports this is not new, but has been ongoing. 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  PRECAUTIONS: N/A  SUBJECTIVE: Patient reports feeling sore after last session, no falls or LOB since last session Slightly late due to ride.   PAIN:  Are you having pain? No     TODAY'S TREATMENT:  INTERVENTIONS:    BP=  149/70 mmHg Right UE sitting     Therapeutic Exercises:   -STS from standard height chair, no UE support, 2x10    6" step: -toe taps 10x each LE -step up/down 10x each LE   seated: RTB around knees: -march 15x each LE RTB around ankles: alternating LAQ 10x   4 way ankle ROM each LE 10x each direction RTB     Neuromuscular re-ed:   Standing with CGA next to support surface:  Airex pad: static stand 30 seconds x 2 trials, noticeable trembling of ankles/LE's with fatigue and challenge to maintain stability Airex pad: horizontal head turns 30 seconds scanning room 10x ; cueing for arc of motion  Airex pad: vertical head turns 30 seconds, cueing for arc of motion, noticeable sway with upward gaze increasing demand on ankle righting reaction musculature  Walk with head turns 2x 60 ft       Education provided throughout session via VC/TC and demonstration to facilitate movement at target joints and correct muscle activation for all testing and exercises performed.      PATIENT EDUCATION: Education details: POC, Economist Person educated: Patient Education method: Explanation, Demonstration, Tactile cues, and Verbal cues Education comprehension: verbalized understanding, returned demonstration, verbal cues required, tactile cues required, and needs further education   HOME EXERCISE PROGRAM: Access Code: [email or web address]     PT Short Term Goals       PT SHORT TERM GOAL #1   Title Independence in HEP for improved balance and strength     Baseline Issued at eval    Time 3    Period Weeks    Status New    Target Date 01/11/22      PT SHORT TERM GOAL #2   Title 5xSTS hands free <19sec    Baseline Eval: >24sec hands free    Time 4    Period Weeks    Status New    Target Date 01/18/22      PT SHORT TERM GOAL #3   Title Ankle DF MMT 5/5 bilat.    Baseline Eval: 4+/5 Right, 4/5 Left    Time 4    Period Weeks    Status New    Target Date 01/18/22              PT Long Term Goals  PT LONG TERM GOAL #1   Title 6MWT > 1241f c LRAD    Baseline July 2019: 13241f   Time 8    Period Weeks    Status New    Target Date 02/15/22      PT LONG TERM GOAL #2   Title 5xSTS <13 sec hands free    Baseline eval: >24sec hands free    Time 8    Period Weeks    Status New    Target Date 02/15/22      PT LONG TERM GOAL #3   Title Pt to demonstrate retroAMB without device >0.2542ms LOB to demonstrate improved ankle righting and control.    Baseline Eval: <0.22m97mnd c 3 LOB    Time 10    Period Weeks    Status New    Target Date 03/01/22      PT LONG TERM GOAL #4   Title FOTO Score increase >15    Baseline eval: 48    Time 12    Period Weeks    Status New    Target Date 03/15/22              Plan     Clinical Impression Statement Patient presents with good motivation. She fatigues quickly with RLE this session. Has increased eccentric LOB with sit to stand. Patient is more challenged with head turns in standing than with ambulation with RW.   Pt will benefit from continued skilled PT interventions to address impairments in order to maximize independence and safety in ADL/IADL and to reduce risk of falls.     Personal Factors and Comorbidities Past/Current Experience    Examination-Activity Limitations Dressing;Transfers;Bend;Locomotion Level;Stairs;Stand    Examination-Participation Restrictions Cleaning;Shop;Meal Prep;Yard[employer]alla [place];Laundry    Stability/Clinical Decision Making Evolving/Moderate  complexity    Rehab Potential Good    Clinical Impairments Affecting Rehab Potential (+) motivation, social support (-) age, chronicity of pain, fairly sedentary lifestyle    PT Frequency 2x / week    PT Duration 12 weeks    PT Treatment/Interventions Aquatic Therapy;Moist Heat;Functional mobility training;Dry needling;Neuromuscular re-education;Traction;Ultrasound;Iontophoresis '4mg'$ /ml Dexamethasone;ADLs/Self Care Home Management;Cryotherapy;Electrical Stimulation;Therapeutic exercise;Therapeutic activities;Patient/family education;Taping;Gait training;Stair training;DME Instruction;Balance training;Passive range of motion    PT Next Visit Plan Progress safety with mobility and LE strengthening as appropriate.    PT Home Exercise Plan Eval: seated heel raises, toe raises, and STS from elevated surface. 01/04/2022=Access Code: [email or web address]: [email or web address]    Consulted and Agree with Plan of Care Patient;Family member/caregiver    Family Member Consulted [place], [place] 02/23/22

## 2022-02-24 ENCOUNTER — Ambulatory Visit: Payer: Medicare Other | Admitting: Podiatry

## 2022-02-24 NOTE — Therapy (Addendum)
OUTPATIENT SPEECH LANGUAGE PATHOLOGY TREATMENT AND PROGRESS NOTE   Patient Name: Cassidy Hernandez MRN: [id number] DOB:[birth year], 81 y.o., female Today's Date: 02/24/2022   REFERRING PROVIDER: Merrilee Seashore, NP  Speech Therapy Progress Note  Dates of Reporting Period: 01/19/2022 to 02/23/2022   Objective: Patient has been seen for 10 speech therapy sessions this reporting period targeting aphasia and dysarthria. Patient is making progress toward all goals; she met 2/3 LTGs this reporting period (4/5 STGs already met). Updated goals with new STGs and LTGs today with plan for recertification on 10/13/44 to continue for additional 12 weeks given progress and pt motivation for change. See skilled intervention, clinical impressions, and goals below for details.    End of Session - 02/23/22 0912     Visit Number 20    Number of Visits 25    Date for SLP Re-Evaluation 03/16/22    Authorization Type BCBS MCR; $10 copay    Authorization - Visit Number 10    Progress Note Due on Visit 10    SLP Start Time 1500    SLP Stop Time  1600    SLP Time Calculation (min) 60 min    Activity Tolerance Patient tolerated treatment well            Past Medical History:  Diagnosis Date   Anginal pain ([place])    Aortic atherosclerosis    Cancer ([place])    Carotid artery stenosis 02/01/2010   a.) Doppler 65/99/3570: 17% LICA and 79% RICA. b.) Doppler 39/10/90: >33% LICA and 00% RICA. c.) Doppler 10/25/16: >70% Bilater ICAs   Chronic bilateral low back pain with left-sided sciatica    CKD (chronic kidney disease), stage III ([facility])    Coronary artery disease    a.) LHC 12/24/2014 -> small LAD system; diffuse CTO of LAD. 60% RCA. LM and LCx with no sig disease. no intervention; med mgmt.   Degenerative arthritis    Diastolic dysfunction    a.) TTE 09/20/2018: EF 55%, G1DD, mild LAE and RVE, triv-mild panvalvular regurgitation.   Dysarthria    GERD (gastroesophageal reflux disease)    Hepatic steatosis     History of kidney stones    HLD (hyperlipidemia)    Hypertension    Kidney stone    Lumbar radiculopathy    Major depression in remission ([place])    PVD (peripheral vascular disease) ([place])    Renal cyst, right 06/02/2015   a.) CT 06/02/2015 --> 4.4 cm RIGHT renal mass; MRI recommended. b.) MRI 06/16/2015 --> 3.8 x 4.2 x 4.0 cm proteinaceous/hemmorhagic cyst.   Subclavian steal syndrome    a.) LHC 12/24/2014 --> tight eccentric 90% ostial stenosis with damping.   T2DM (type 2 diabetes mellitus) [facility])    Past Surgical History:  Procedure Laterality Date   ABDOMINAL HYSTERECTOMY     AUGMENTATION MAMMAPLASTY Bilateral 1980   BACK SURGERY  09/2018   BICEPT TENODESIS  06/10/2021   Procedure: BICEPS TENODESIS;  Surgeon: Corky Mull, MD;  Location: [facility];  Service: Orthopedics;;   CARDIAC CATHETERIZATION Left 12/24/2014   Procedure: LEFT HEARD CATHETERIZATION; Location: [facility]; Surgeon: Laurence Aly, MD   CHOLECYSTECTOMY     COLONOSCOPY N/A 02/11/2016   Procedure: COLONOSCOPY;  Surgeon: Manya Silvas, MD;  Location: [facility];  Service: Endoscopy;  Laterality: N/A;   IR CT HEAD LTD  11/16/2021   IR CT HEAD LTD  11/16/2021   IR INTRAVSC STENT CERV CAROTID W/O EMB-PROT MOD SED INC ANGIO  11/18/2021   IR  PERCUTANEOUS ART THROMBECTOMY/INFUSION INTRACRANIAL INC DIAG ANGIO  11/16/2021   RADIOLOGY WITH ANESTHESIA N/A 11/16/2021   Procedure: IR WITH ANESTHESIA- CODE STROKE;  Surgeon: Radiologist, Medication, MD;  Location: [facility];  Service: Radiology;  Laterality: N/A;   REVERSE SHOULDER ARTHROPLASTY Right 06/10/2021   Procedure: REVERSE SHOULDER ARTHROPLASTY;  Surgeon: Corky Mull, MD;  Location: [facility];  Service: Orthopedics;  Laterality: Right;   Patient Active Problem List   Diagnosis Date Noted   Acute ischemic left MCA stroke ([place]) 11/16/2021   Middle cerebral artery embolism, 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   Status post reverse arthroplasty of shoulder, right 06/10/2021    ONSET DATE: 11/16/21    REFERRING DIAG: L MCA CVA   HPI: Pt is a 81 y.o. 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 Patient is s/p thrombectomy with left ICA stent placement 11/16/21. She completed inpatient rehabilitation at [facility] 11/25/21-12/07/21 and was discharged home with family. Patient known to this clinic from prior course of voice therapy in 2018. Pt with documented hx of voice and speech changes (dysarthria) per videostroboscopy 12/26/17; did not attend therapy.   THERAPY DIAG:  Aphasia  Dysarthria and anarthria  Rationale for Evaluation and Treatment Rehabilitation  SUBJECTIVE: Pt arrives with sister today.  Pt accompanied by: sister Rod Holler  PAIN: No Are you having pain? No     OBJECTIVE:   TODAY'S TREATMENT: SLP reassessed current language abilities using portions from Reading Comprehension Battery for Aphasia; paragraph-factual 9/10, paragraph-inferential 10/10, morpho-syntax 5/10. Clinician reviewed current goals with Pt with visual aids, discussing goals met and probing for additional language, speech, and swallowing concerns. Pt identified 3 areas of concern (reading, articulation, communication break downs). SLP prompted Pt to read 9 paragraph story, Pt reported difficulty reading and comprehending after 1st paragraph. SLP modified activity; Pt read story along audio Software engineer) with increased comprehension.     PATIENT EDUCATION: Education details: progress with writing tasks  Person educated: Patient Education method: Explanation, Demonstration, and Verbal cues Education comprehension: verbalized understanding and needs further education  SLP Short Term Goals - 02/24/22 1334       SLP SHORT TERM GOAL #1   Title Pt will communicate emergency information 100% accuracy using visual aid if necessary.    Baseline 100% 01/24/22    Time 6    Period Weeks    Status Achieved      Target Date 01/27/22      SLP SHORT TERM GOAL #2   Title Pt will generate  at least 4 descriptors of target word 80% of the time using semantic feature analysis to improve abilities in wordfinding and resolving communication breakdowns.    Baseline 85% 01/24/22    Time 6    Period Weeks    Status Achieved     Target Date 01/27/22      SLP SHORT TERM GOAL #3   Title Patient will request repetition/rephrasing to aid auditory comprehension of mod complex information (multi-step commands, conversation) in 80% of opportunities.    Baseline 85% 01/24/22    Time 6    Period Weeks    Status Achieved    Target Date 01/27/22      SLP SHORT TERM GOAL #4   Title Pt will read and reply to simple texts/emails 80% accuracy with use of accessibility features as necessary.    Baseline 01/24/22, extended time, requires occasional cues, continues to require cuing with accessibility features    Time 6    Period  Weeks    Status Partially Met    Target Date 01/27/22      SLP SHORT TERM GOAL #5   Title Pt will write sentences of 5-7 words 90% accuracy (self correction allowed) with occasional min cues.    Time 10    Period --   sessions   Status New      Additional Short Term Goals   Additional Short Term Goals Yes      SLP SHORT TERM GOAL #6   Title Pt will use intelligibility strategies (loudness, breath support, overarticulation) for sentence level responses 90% accuracy with min cues.    Time 10    Period --   sessions   Status New      SLP SHORT TERM GOAL #7   Title Pt will use aphasia compensations in 10 minutes moderately complex conversation with fewer than 3 listener questions necessary for clarification of message.    Time 10    Period --   sessions   Status New                   SLP Long Term Goals - 02/24/22 1348       SLP LONG TERM GOAL #1   Title Pt will engage in 5-8 minutes simple-mod complex conversation re: topic of interest with supported conversation, aphasia compensations.    Time 12    Period Weeks    Status Achieved      SLP LONG  TERM GOAL #2   Title Pt will ID and attempt repair of communication breakdowns >80% of the time in session.    Time 12    Period Weeks    Status Achieved      SLP LONG TERM GOAL #3   Title Pt will demonstrate reading comprehension of mod complex short story or articles of interest with technology supports (text-to-speech, ereader, etc) by generating summary or answering questions >80% accuracy.    Baseline 02/23/22: Reads/comprehends simple paragraphs unassisted.    Time 12    Period Weeks    Status Revised   and renewed for 12 weeks   Target Date 03/08/22      SLP LONG TERM GOAL #4   Title Pt will self-advocate for communication needs in community or family outings (per her report) to increase confidence in social interactions.    Time 12    Period Weeks    Status New    Target Date 03/08/22      SLP LONG TERM GOAL #5   Title Pt will report reduced frustration with communication breakdowns by implementing aphasia compensation outside [place] room.    Baseline 02/24/22: frustrated 3 times per day on average    Time 12    Period Weeks    Status New    Target Date 03/08/22      Additional Long Term Goals   Additional Long Term Goals Yes      SLP LONG TERM GOAL #6   Title Pt will use intelligibility strategies (loudness, breath support, overarticulation) 80% accuracy in 10 minutes conversation with modified independence.    Time 12    Period Weeks    Status New    Target Date 05/25/22              Plan - 02/01/22 1121     Clinical Impression Statement Pt continues to present with moderate aphasia, with expressive deficits greater than receptive deficits. Pt can read functionally with short paragraphs and simple sentence construction  however with more complex syntax reading is impaired at sentence level. 50% acc with more complex sentence structures. Pt benefited from auditory feedback to comprehend longer reading. Pt's writing in simple sentence structures observed to improve  over last couple of session, plan to continue targeting language in written expression. Pt reports targeting dysarthria and communication break downs/aphasia compensations as a priority, and agreed with concerns about swallowing and vocal difficulties. Pt confirmed planning to see ENT for reassessment on vocal concerns. Pt is progressing toward goals and benefits from continued skilled ST intervention per plan of care.    Speech Therapy Frequency 2x / week    Duration 2 weeks    Treatment/Interventions SLP instruction and feedback;Multimodal communcation approach;Patient/family education;Functional tasks;Cueing hierarchy;Compensatory techniques;Compensatory strategies    Potential to Achieve Goals Good    Potential Considerations Family/community support    SLP Home Exercise Plan [facility] Therapy    Consulted and Agree with Plan of Care Patient             I agree with the following treatment note after reviewing documentation.  This session was performed under the supervision of a licensed clinician.   Deneise Lever, MS, [place], [place] 02/24/2022, 2:04 PM

## 2022-02-28 ENCOUNTER — Ambulatory Visit: Payer: Medicare Other

## 2022-02-28 ENCOUNTER — Encounter: Payer: Medicare Other | Admitting: Speech Pathology

## 2022-02-28 DIAGNOSIS — R2681 Unsteadiness on feet: Secondary | ICD-10-CM

## 2022-02-28 DIAGNOSIS — R4701 Aphasia: Secondary | ICD-10-CM | POA: Diagnosis not present

## 2022-02-28 DIAGNOSIS — R262 Difficulty in walking, not elsewhere classified: Secondary | ICD-10-CM

## 2022-02-28 DIAGNOSIS — M6281 Muscle weakness (generalized): Secondary | ICD-10-CM

## 2022-02-28 NOTE — Therapy (Signed)
OUTPATIENT PHYSICAL THERAPY TREATMENT NOTE   Patient Name: Cassidy Hernandez MRN: [id number] DOB:Aug 14, 1940, 81 y.o., female Today's Date: 02/28/2022  PCP: Kirk Ruths, MD REFERRING PROVIDER: Rayann Heman, NP   PT End of Session - 02/28/22 1153     Visit Number 17    Number of Visits 24    Date for PT Re-Evaluation 03/15/22    Authorization Type BCBS Medicare    Authorization Time Period 12/21/21-03/15/22    Progress Note Due on Visit 10    PT Start Time 1110    PT Stop Time 1145    PT Time Calculation (min) 35 min    Equipment Utilized During Treatment Gait belt    Activity Tolerance Patient tolerated treatment well    Behavior During Therapy [facility] for tasks assessed/performed              Past Medical History:  Diagnosis Date   Anginal pain ([place])    Aortic atherosclerosis    Cancer ([place])    Carotid artery stenosis 02/01/2010   a.) Doppler 69/67/8938: 10% LICA and 17% RICA. b.) Doppler 51/09/5850: >77% LICA and 82% RICA. c.) Doppler 10/25/16: >70% Bilater ICAs   Chronic bilateral low back pain with left-sided sciatica    CKD (chronic kidney disease), stage III ([place])    Coronary artery disease    a.) LHC 12/24/2014 -> small LAD system; diffuse CTO of LAD. 60% RCA. LM and LCx with no sig disease. no intervention; med mgmt.   Degenerative arthritis    Diastolic dysfunction    a.) TTE 09/20/2018: EF 55%, G1DD, mild LAE and RVE, triv-mild panvalvular regurgitation.   Dysarthria    GERD (gastroesophageal reflux disease)    Hepatic steatosis    History of kidney stones    HLD (hyperlipidemia)    Hypertension    Kidney stone    Lumbar radiculopathy    Major depression in remission ([place])    PVD (peripheral vascular disease) ([place])    Renal cyst, right 06/02/2015   a.) CT 06/02/2015 --> 4.4 cm RIGHT renal mass; MRI recommended. b.) MRI 06/16/2015 --> 3.8 x 4.2 x 4.0 cm proteinaceous/hemmorhagic cyst.   Subclavian steal syndrome    a.) LHC 12/24/2014 --> tight  eccentric 90% ostial stenosis with damping.   T2DM (type 2 diabetes mellitus) [facility])    Past Surgical History:  Procedure Laterality Date   ABDOMINAL HYSTERECTOMY     AUGMENTATION MAMMAPLASTY Bilateral 1980   BACK SURGERY  09/2018   BICEPT TENODESIS  06/10/2021   Procedure: BICEPS TENODESIS;  Surgeon: Corky Mull, MD;  Location: [place] [facility];  Service: Orthopedics;;   CARDIAC CATHETERIZATION Left 12/24/2014   Procedure: LEFT HEARD CATHETERIZATION; Location: [facility]; Surgeon: Laurence Aly, MD   CHOLECYSTECTOMY     COLONOSCOPY N/A 02/11/2016   Procedure: COLONOSCOPY;  Surgeon: Manya Silvas, MD;  Location: [facility] END[facility]COPY;  Service: Endoscopy;  Laterality: N/A;   IR CT HEAD LTD  11/16/2021   IR CT HEAD LTD  11/16/2021   IR INTRAVSC STENT CERV CAROTID W/O EMB-PROT MOD SED INC ANGIO  11/18/2021   IR PERCUTANEOUS ART THROMBECTOMY/INFUSION INTRACRANIAL INC DIAG ANGIO  11/16/2021   RADIOLOGY WITH ANESTHESIA N/A 11/16/2021   Procedure: IR WITH ANESTHESIA- CODE STROKE;  Surgeon: Radiologist, Medication, MD;  Location: [place];  Service: Radiology;  Laterality: N/A;   REVERSE SHOULDER ARTHROPLASTY Right 06/10/2021   Procedure: REVERSE SHOULDER ARTHROPLASTY;  Surgeon: Corky Mull, MD;  Location: [facility];  Service: Orthopedics;  Laterality: Right;  Patient Active Problem List   Diagnosis Date Noted   Acute ischemic left MCA stroke ([place]) 11/16/2021   Middle cerebral artery embolism, 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   Status post reverse arthroplasty of shoulder, right 06/10/2021    REFERRING DIAG: T02.409 (ICD-10-CM) - Cerebral infarction due to unspecified occlusion or stenosis of left middle cerebral artery  THERAPY DIAG:  Muscle weakness (generalized)  Difficulty in walking, not elsewhere classified  Unsteadiness on feet  Rationale for Evaluation and Treatment Rehabilitation  PERTINENT HISTORY: Pt Recent L MCA, s/p thrombectomy and ICA stenting. Pt with speech deficits. Pt has been somewhat unsteady with a few  falls falling backward however DTR reports this is not new, but has been ongoing. 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  PRECAUTIONS: N/A  SUBJECTIVE: Pt reports no pain, no falls/stumbles. Reports no current concerns with her BP. No other concerns currently.  PAIN:  Are you having pain? No   TODAY'S TREATMENT:   02/28/2022: Therapeutic Exercises:   -STS from standard height chair, no UE support, 2x12. Pt rates medium. Cuing for anterior weight shift. Improves eccentric control following cue. Noted slight decrease in weight-shift from RLE (sister reports painful callus on lateral R foot, has dr appointment for this on the 26th per pt's sister)    6" step: -toe taps 10x 2 sets each LE -step up/down 10x each LE Comments: cuing throughout for foot clearance. Pt improves with reps and following cues  Instruction on safe stand>sit technique    seated: GTB around knees: -march 15x each LE. Cuing for upright posture for improved engagement of postural muscles. Pt rates medium  GTB around ankles: alternating LAQ 12x  LAQ no weight 20x each LE  Neuromuscular re-ed:  Walk with head turns (vertical, horizontal) 2x10 meters. NBOS EO 2x30 sec, intermittent UE support WBOS EC 30 sec   Education provided throughout session via VC/TC and demonstration to facilitate movement at target joints and correct muscle activation for all testing and exercises performed.      PATIENT EDUCATION: Education details: exercise technique, body mechanics, sit to stand technique Person educated: Patient Education method: Explanation, Demonstration, Tactile cues, and Verbal cues Education comprehension: verbalized understanding, returned demonstration, verbal cues required, tactile cues required, and needs further education   HOME EXERCISE PROGRAM: Access Code: [email or web address]     PT Short Term Goals       PT SHORT TERM GOAL #1   Title Independence  in HEP for improved balance and strength    Baseline Issued at eval    Time 3    Period Weeks    Status New    Target Date 01/11/22      PT SHORT TERM GOAL #2   Title 5xSTS hands free <19sec    Baseline Eval: >24sec hands free    Time 4    Period Weeks    Status New    Target Date 01/18/22      PT SHORT TERM GOAL #3   Title Ankle DF MMT 5/5 bilat.    Baseline Eval: 4+/5 Right, 4/5 Left    Time 4    Period Weeks    Status New    Target Date 01/18/22              PT Long Term Goals       PT LONG TERM GOAL #1   Title 6MWT > 1269f c LRAD    Baseline July 2019:  134f    Time 8    Period Weeks    Status New    Target Date 02/15/22      PT LONG TERM GOAL #2   Title 5xSTS <13 sec hands free    Baseline eval: >24sec hands free    Time 8    Period Weeks    Status New    Target Date 02/15/22      PT LONG TERM GOAL #3   Title Pt to demonstrate retroAMB without device >0.250m s LOB to demonstrate improved ankle righting and control.    Baseline Eval: <0.0159mand c 3 LOB    Time 10    Period Weeks    Status New    Target Date 03/01/22      PT LONG TERM GOAL #4   Title FOTO Score increase >15    Baseline eval: 48    Time 12    Period Weeks    Status New    Target Date 03/15/22              Plan     Clinical Impression Statement Session limited secondary to pt late arrival, however, patient with excellent motivation. She was able to advance volume of reps performed with majority of therex, indicating improved BLE strength and endurance. Required some cuing for safe sit<>stand technique, and for improved BLE clearance with step-up exercise to increase safety, decrease fall risk. Pt will benefit from continued skilled PT interventions to address impairments in order to maximize independence and safety in ADL/IADL and to reduce risk of falls.     Personal Factors and Comorbidities Past/Current Experience    Examination-Activity Limitations  Dressing;Transfers;Bend;Locomotion Level;Stairs;Stand    Examination-Participation Restrictions Cleaning;Shop;Meal Prep;[place];Laundry    Stability/Clinical Decision Making Evolving/Moderate complexity    Rehab Potential Good    Clinical Impairments Affecting Rehab Potential (+) motivation, social support (-) age, chronicity of pain, fairly sedentary lifestyle    PT Frequency 2x / week    PT Duration 12 weeks    PT Treatment/Interventions Aquatic Therapy;Moist Heat;Functional mobility training;Dry needling;Neuromuscular re-education;Traction;Ultrasound;Iontophoresis '4mg'$ /ml Dexamethasone;ADLs/Self Care Home Management;Cryotherapy;Electrical Stimulation;Therapeutic exercise;Therapeutic activities;Patient/family education;Taping;Gait training;Stair training;DME Instruction;Balance training;Passive range of motion    PT Next Visit Plan Progress safety with mobility and LE strengthening as appropriate.    PT Home Exercise Plan Eval: seated heel raises, toe raises, and STS from elevated surface. 01/04/2022=Access Code: [email or web address]: [email or web address]    Consulted and Agree with Plan of Care Patient;Family member/caregiver    Family Member Consulted [place], DPT  02/28/22

## 2022-03-01 ENCOUNTER — Ambulatory Visit: Payer: Medicare Other | Admitting: Speech Pathology

## 2022-03-01 ENCOUNTER — Other Ambulatory Visit: Payer: Self-pay

## 2022-03-01 DIAGNOSIS — R471 Dysarthria and anarthria: Secondary | ICD-10-CM

## 2022-03-01 DIAGNOSIS — R4701 Aphasia: Secondary | ICD-10-CM

## 2022-03-01 NOTE — Therapy (Unsigned)
OUTPATIENT SPEECH LANGUAGE PATHOLOGY TREATMENT NOTE   Patient Name: Cassidy Hernandez MRN: [id number] DOB:1941-05-15, 81 y.o., female Today's Date: 03/01/2022   REFERRING PROVIDER: Merrilee Seashore, NP     End of Session - 03/01/22 1235     Visit Number 21    Number of Visits 25    Date for SLP Re-Evaluation 03/16/22    Authorization Type BCBS MCR; $10 copay    Authorization - Visit Number 11    Progress Note Due on Visit 10    SLP Start Time 1105    SLP Stop Time  1200    SLP Time Calculation (min) 55 min    Activity Tolerance Patient tolerated treatment well   cognitive fatigue last 5 mins of session              Past Medical History:  Diagnosis Date   Anginal pain ([place])    Aortic atherosclerosis    Cancer ([place])    Carotid artery stenosis 02/01/2010   a.) Doppler 53/97/6734: 19% LICA and 37% RICA. b.) Doppler 90/24/0973: >53% LICA and 29% RICA. c.) Doppler 10/25/16: >70% Bilater ICAs   Chronic bilateral low back pain with left-sided sciatica    CKD (chronic kidney disease), stage III ([facility])    Coronary artery disease    a.) LHC 12/24/2014 -> small LAD system; diffuse CTO of LAD. 60% RCA. LM and LCx with no sig disease. no intervention; med mgmt.   Degenerative arthritis    Diastolic dysfunction    a.) TTE 09/20/2018: EF 55%, G1DD, mild LAE and RVE, triv-mild panvalvular regurgitation.   Dysarthria    GERD (gastroesophageal reflux disease)    Hepatic steatosis    History of kidney stones    HLD (hyperlipidemia)    Hypertension    Kidney stone    Lumbar radiculopathy    Major depression in remission ([place])    PVD (peripheral vascular disease) ([place])    Renal cyst, right 06/02/2015   a.) CT 06/02/2015 --> 4.4 cm RIGHT renal mass; MRI recommended. b.) MRI 06/16/2015 --> 3.8 x 4.2 x 4.0 cm proteinaceous/hemmorhagic cyst.   Subclavian steal syndrome    a.) LHC 12/24/2014 --> tight eccentric 90% ostial stenosis with damping.   T2DM (type 2 diabetes mellitus) [facility])    Past  Surgical History:  Procedure Laterality Date   ABDOMINAL HYSTERECTOMY     AUGMENTATION MAMMAPLASTY Bilateral 1980   BACK SURGERY  09/2018   BICEPT TENODESIS  06/10/2021   Procedure: BICEPS TENODESIS;  Surgeon: Corky Mull, MD;  Location: [place] [facility];  Service: Orthopedics;;   CARDIAC CATHETERIZATION Left 12/24/2014   Procedure: LEFT HEARD CATHETERIZATION; Location: [facility]; Surgeon: Laurence Aly, MD   CHOLECYSTECTOMY     COLONOSCOPY N/A 02/11/2016   Procedure: COLONOSCOPY;  Surgeon: Manya Silvas, MD;  Location[facility] ENDOSCOPY;  Service: Endoscopy;  Laterality: N/A;   IR CT HEAD LTD  11/16/2021   IR CT HEAD LTD  11/16/2021   IR INTRAVSC STENT CERV CAROTID W/O EMB-PROT MOD SED INC ANGIO  11/18/2021   IR PERCUTANEOUS ART THROMBECTOMY/INFUSION INTRACRANIAL INC DIAG ANGIO  11/16/2021   RADIOLOGY WITH ANESTHESIA N/A 11/16/2021   Procedure: IR WITH ANESTHESIA- CODE STROKE;  Surgeon: Radiologist, Medication, MD;  Location: [place];  Service: Radiology;  Laterality: N/A;   REVERSE SHOULDER ARTHROPLASTY Right 06/10/2021   Procedure: REVERSE SHOULDER ARTHROPLASTY;  Surgeon: Corky Mull, MD;  Location: [facility];  Service: Orthopedics;  Laterality: Right;   Patient Active Problem List   Diagnosis  Date Noted   Acute ischemic left MCA stroke ([place]) 11/16/2021   Middle cerebral artery embolism, 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   Status post reverse arthroplasty of shoulder, right 06/10/2021    ONSET DATE: 11/16/21   REFERRING DIAG: L MCA CVA   HPI: Pt is a 81 y.o. 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 Patient is s/p thrombectomy with left ICA stent placement 11/16/21. She completed inpatient rehabilitation at [facility] 11/25/21-12/07/21 and was discharged home with family. Patient known to this clinic from prior course of voice therapy in 2018. Pt with documented hx of voice and speech changes (dysarthria) per videostroboscopy 12/26/17; did not attend therapy.   THERAPY DIAG:  Aphasia  Dysarthria and  anarthria  Rationale for Evaluation and Treatment Rehabilitation  SUBJECTIVE: Pt arrives with sister today  Pt accompanied by: sister Rod Holler  PAIN: No Are you having pain? No     OBJECTIVE:   TODAY'S TREATMENT: SLP facilitated 5 min conversation; Pt used aphasia compensations 2x with modified independence. SLP targeted dysarthria compensations to improve intelligibility by introducing SLOP (Slow, loud, over-pronounce, pause). Used auditory and visual models (recording) to increase Pt awareness of current intensity and reinforce need for increased intensity. SLP prompted Pt to say phrases while implementing SLOP, initially with models and mod cues with 70% acc. Facilitated creating personal phrases for HEP with min cues; verbalized HEP tongue twisters with 65% acc with min cues to slow down, over-pronounce and increase intensity.    PATIENT EDUCATION: Education details: progress with writing tasks  Person educated: Patient Education method: Explanation, Demonstration, and Verbal cues Education comprehension: verbalized understanding and needs further education  SLP Short Term Goals - 02/24/22 1334       SLP SHORT TERM GOAL #1   Title Pt will communicate emergency information 100% accuracy using visual aid if necessary.    Baseline 100% 01/24/22    Time 6    Period Weeks    Status Achieved      Target Date 01/27/22      SLP SHORT TERM GOAL #2   Title Pt will generate at least 4 descriptors of target word 80% of the time using semantic feature analysis to improve abilities in wordfinding and resolving communication breakdowns.    Baseline 85% 01/24/22    Time 6    Period Weeks    Status Achieved     Target Date 01/27/22      SLP SHORT TERM GOAL #3   Title Patient will request repetition/rephrasing to aid auditory comprehension of mod complex information (multi-step commands, conversation) in 80% of opportunities.    Baseline 85% 01/24/22    Time 6    Period Weeks    Status  Achieved    Target Date 01/27/22      SLP SHORT TERM GOAL #4   Title Pt will read and reply to simple texts/emails 80% accuracy with use of accessibility features as necessary.    Baseline 01/24/22, extended time, requires occasional cues, continues to require cuing with accessibility features    Time 6    Period Weeks    Status Partially Met    Target Date 01/27/22      SLP SHORT TERM GOAL #5   Title Pt will write sentences of 5-7 words 90% accuracy (self correction allowed) with occasional min cues.    Time 10    Period --   sessions   Status New      Additional Short Term Goals  Additional Short Term Goals Yes      SLP SHORT TERM GOAL #6   Title Pt will use intelligibility strategies (loudness, breath support, overarticulation) for sentence level responses 90% accuracy with min cues.    Time 10    Period --   sessions   Status New      SLP SHORT TERM GOAL #7   Title Pt will use aphasia compensations in 10 minutes moderately complex conversation with fewer than 3 listener questions necessary for clarification of message.    Time 10    Period --   sessions   Status New                   SLP Long Term Goals - 02/24/22 1348       SLP LONG TERM GOAL #1   Title Pt will engage in 5-8 minutes simple-mod complex conversation re: topic of interest with supported conversation, aphasia compensations.    Time 12    Period Weeks    Status Achieved      SLP LONG TERM GOAL #2   Title Pt will ID and attempt repair of communication breakdowns >80% of the time in session.    Time 12    Period Weeks    Status Achieved      SLP LONG TERM GOAL #3   Title Pt will demonstrate reading comprehension of mod complex short story or articles of interest with technology supports (text-to-speech, ereader, etc) by generating summary or answering questions >80% accuracy.    Baseline 02/23/22: Reads/comprehends simple paragraphs unassisted.    Time 12    Period Weeks    Status Revised    and renewed for 12 weeks   Target Date 03/08/22      SLP LONG TERM GOAL #4   Title Pt will self-advocate for communication needs in community or family outings (per her report) to increase confidence in social interactions.    Time 12    Period Weeks    Status New    Target Date 03/08/22      SLP LONG TERM GOAL #5   Title Pt will report reduced frustration with communication breakdowns by implementing aphasia compensation outside [place] room.    Baseline 02/24/22: frustrated 3 times per day on average    Time 12    Period Weeks    Status New    Target Date 03/08/22      Additional Long Term Goals   Additional Long Term Goals Yes      SLP LONG TERM GOAL #6   Title Pt will use intelligibility strategies (loudness, breath support, overarticulation) 80% accuracy in 10 minutes conversation with modified independence.    Time 12    Period Weeks    Status New    Target Date 05/25/22              Plan - 02/01/22 1121     Clinical Impression Statement Pt continues to present with moderate aphasia, with expressive deficits greater than receptive deficits. Pt's intensity and intelligibility noted to increase after implementing SLOP in simple phrases. Pt benefited from visual and auditory models (mirror, recording) to increase awareness on current intensity and how it looks to over-pronounce. Pt demonstrated hesitation with talking with increased volume and over-pronouncing; Clinician educated on stroke recovery and reinforced benefit from [facility] compensation to increase intelligibility and communication breakdowns. Pt reported scheduling ENT visit to assess vocal concerns. Pt is progressing toward goals and benefits from continued skilled ST  intervention per plan of care.   Speech Therapy Frequency 2x / week    Duration 2 weeks    Treatment/Interventions SLP instruction and feedback;Multimodal communcation approach;Patient/family education;Functional tasks;Cueing hierarchy;Compensatory  techniques;Compensatory strategies    Potential to Achieve Goals Good    Potential Considerations Family/community support    SLP Home Exercise Plan TalkPath Therapy    Consulted and Agree with Plan of Care Patient               Seth Bake 03/01/2022, 12:36 PM

## 2022-03-01 NOTE — Patient Instructions (Addendum)
  SLOW LOUD OVER-PRONOUNCE (MOVE YOUR MUSCLES!) PAUSE     Say each 5x Slow and Big - make each sound distinct    BUTTERCUP   CATERPILLAR   BASEBALLL PLAYER   [place]   [place]   RED LEATHER YELLOW LEATHER   [place]QUE [place]   THREE FREE THROWS   FLASH MESSAGE   CINNAMON LINOLEUM ALUMINUM   SLOW AND BIG - EXAGGERATE YOUR MOUTH, MAKE EACH CONSONANT  Hello! [employer] [place] [employer] How are you this morning? What are we gonna eat? I went to speech therapy. I had PT. I went for a walk. Let's go walk. Pay me some money! I need to get my nails done. Can I have the salad with the chicken? Ranch dressing, please! Sweet iced tea Fried chicken with gravy White meat Creamed potatoes and a salad

## 2022-03-02 ENCOUNTER — Ambulatory Visit (INDEPENDENT_AMBULATORY_CARE_PROVIDER_SITE_OTHER): Payer: Medicare Other | Admitting: Podiatry

## 2022-03-02 ENCOUNTER — Encounter: Payer: Self-pay | Admitting: Podiatry

## 2022-03-02 ENCOUNTER — Ambulatory Visit: Payer: Medicare Other | Admitting: Speech Pathology

## 2022-03-02 ENCOUNTER — Ambulatory Visit: Payer: Medicare Other

## 2022-03-02 DIAGNOSIS — R4701 Aphasia: Secondary | ICD-10-CM

## 2022-03-02 DIAGNOSIS — N183 Chronic kidney disease, stage 3 unspecified: Secondary | ICD-10-CM | POA: Insufficient documentation

## 2022-03-02 DIAGNOSIS — L84 Corns and callosities: Secondary | ICD-10-CM

## 2022-03-02 DIAGNOSIS — E1169 Type 2 diabetes mellitus with other specified complication: Secondary | ICD-10-CM | POA: Insufficient documentation

## 2022-03-02 DIAGNOSIS — E7800 Pure hypercholesterolemia, unspecified: Secondary | ICD-10-CM | POA: Insufficient documentation

## 2022-03-02 DIAGNOSIS — R471 Dysarthria and anarthria: Secondary | ICD-10-CM

## 2022-03-02 DIAGNOSIS — M21171 Varus deformity, not elsewhere classified, right ankle: Secondary | ICD-10-CM

## 2022-03-02 DIAGNOSIS — Q6671 Congenital pes cavus, right foot: Secondary | ICD-10-CM | POA: Diagnosis not present

## 2022-03-02 DIAGNOSIS — I2510 Atherosclerotic heart disease of native coronary artery without angina pectoris: Secondary | ICD-10-CM | POA: Insufficient documentation

## 2022-03-02 NOTE — Patient Instructions (Signed)
Look for urea 40% cream or ointment and apply to the thickened dry skin / calluses. This can be bought over the counter, at a pharmacy or online such as [employer].  Look for aperture pads and dancer's pads on [place] as well, you can apply these to the foot (can be foam or felt) to take pressure off the callused area

## 2022-03-03 ENCOUNTER — Ambulatory Visit: Payer: Medicare Other | Admitting: Physical Therapy

## 2022-03-03 DIAGNOSIS — M6281 Muscle weakness (generalized): Secondary | ICD-10-CM

## 2022-03-03 DIAGNOSIS — R4701 Aphasia: Secondary | ICD-10-CM | POA: Diagnosis not present

## 2022-03-03 DIAGNOSIS — R2681 Unsteadiness on feet: Secondary | ICD-10-CM

## 2022-03-03 DIAGNOSIS — R262 Difficulty in walking, not elsewhere classified: Secondary | ICD-10-CM

## 2022-03-03 DIAGNOSIS — R269 Unspecified abnormalities of gait and mobility: Secondary | ICD-10-CM

## 2022-03-03 NOTE — Therapy (Signed)
OUTPATIENT PHYSICAL THERAPY TREATMENT NOTE   Patient Name: Cassidy Hernandez MRN: [id number] DOB:1941/01/28, 81 y.o., female Today's Date: 03/03/2022  PCP: Kirk Ruths, MD REFERRING PROVIDER: Rayann Heman, NP   PT End of Session - 03/03/22 1511     Visit Number 18    Number of Visits 24    Date for PT Re-Evaluation 03/15/22    Authorization Type BCBS Medicare    Authorization Time Period 12/21/21-03/15/22    Progress Note Due on Visit 10    PT Start Time 1515    PT Stop Time 1600    PT Time Calculation (min) 45 min    Equipment Utilized During Treatment Gait belt    Activity Tolerance Patient tolerated treatment well    Behavior During Therapy [facility] for tasks assessed/performed               Past Medical History:  Diagnosis Date   Anginal pain ([place])    Aortic atherosclerosis    Cancer ([place])    Carotid artery stenosis 02/01/2010   a.) Doppler 29/51/8841: 66% LICA and 06% RICA. b.) Doppler 30/16/0109: >32% LICA and 35% RICA. c.) Doppler 10/25/16: >70% Bilater ICAs   Chronic bilateral low back pain with left-sided sciatica    CKD (chronic kidney disease), stage III ([place])    Coronary artery disease    a.) LHC 12/24/2014 -> small LAD system; diffuse CTO of LAD. 60% RCA. LM and LCx with no sig disease. no intervention; med mgmt.   Degenerative arthritis    Diastolic dysfunction    a.) TTE 09/20/2018: EF 55%, G1DD, mild LAE and RVE, triv-mild panvalvular regurgitation.   Dysarthria    GERD (gastroesophageal reflux disease)    Hepatic steatosis    History of kidney stones    HLD (hyperlipidemia)    Hypertension    Kidney stone    Lumbar radiculopathy    Major depression in remission ([place])    PVD (peripheral vascular disease) ([place])    Renal cyst, right 06/02/2015   a.) CT 06/02/2015 --> 4.4 cm RIGHT renal mass; MRI recommended. b.) MRI 06/16/2015 --> 3.8 x 4.2 x 4.0 cm proteinaceous/hemmorhagic cyst.   Subclavian steal syndrome    a.) LHC 12/24/2014 --> tight  eccentric 90% ostial stenosis with damping.   T2DM (type 2 diabetes mellitus) [facility])    Past Surgical History:  Procedure Laterality Date   ABDOMINAL HYSTERECTOMY     AUGMENTATION MAMMAPLASTY Bilateral 1980   BACK SURGERY  09/2018   BICEPT TENODESIS  06/10/2021   Procedure: BICEPS TENODESIS;  Surgeon: Corky Mull, MD;  Location: [place] [facility];  Service: Orthopedics;;   CARDIAC CATHETERIZATION Left 12/24/2014   Procedure: LEFT HEARD CATHETERIZATION; Location: [facility]; Surgeon: Laurence Aly, MD   CHOLECYSTECTOMY     COLONOSCOPY N/A 02/11/2016   Procedure: COLONOSCOPY;  Surgeon: Manya Silvas, MD;  Location: [facility] ENDOSCOPY;  Service: Endoscopy;  Laterality: N/A;   IR CT HEAD LTD  11/16/2021   IR CT HEAD LTD  11/16/2021   IR INTRAVSC STENT CERV CAROTID W/O EMB-PROT MOD SED INC ANGIO  11/18/2021   IR PERCUTANEOUS ART THROMBECTOMY/INFUSION INTRACRANIAL INC DIAG ANGIO  11/16/2021   RADIOLOGY WITH ANESTHESIA N/A 11/16/2021   Procedure: IR WITH ANESTHESIA- CODE STROKE;  Surgeon: Radiologist, Medication, MD;  Location: [place];  Service: Radiology;  Laterality: N/A;   REVERSE SHOULDER ARTHROPLASTY Right 06/10/2021   Procedure: REVERSE SHOULDER ARTHROPLASTY;  Surgeon: Corky Mull, MD;  Location: [facility];  Service: Orthopedics;  Laterality:  Right;   Patient Active Problem List   Diagnosis Date Noted   Benign hypertension with CKD (chronic kidney disease) stage III ([place]) 03/02/2022   CAD (coronary artery disease) 03/02/2022   Hypercholesteremia 03/02/2022   Right hemiparesis ([place]) 12/16/2021   Acute ischemic left MCA stroke ([place]) 11/16/2021   Middle cerebral artery embolism, 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   Status post reverse arthroplasty of shoulder, right 06/10/2021   Traumatic complete tear of right rotator cuff 02/26/2021   Major depression in remission ([place]) 06/25/2020   Dysarthria 09/20/2018   Gastroesophageal reflux disease without esophagitis 09/20/2018   Lumbar radiculopathy 09/20/2018   Healthcare  maintenance 05/02/2018   Controlled type 2 diabetes mellitus with stage 3 chronic kidney disease, without long-term current use of insulin ([place]) 01/30/2018   Chronic bilateral low back pain with left-sided sciatica 12/07/2016   Hepatic steatosis 11/23/2015   Carotid artery disease ([place]) 01/06/2010    REFERRING DIAG: S06.301 (ICD-10-CM) - Cerebral infarction due to unspecified occlusion or stenosis of left middle cerebral artery  THERAPY DIAG:  Muscle weakness (generalized)  Difficulty in walking, not elsewhere classified  Unsteadiness on feet  Abnormality of gait and mobility  Rationale for Evaluation and Treatment Rehabilitation  PERTINENT HISTORY: Pt Recent L MCA, s/p thrombectomy and ICA stenting. Pt with speech deficits. Pt has been somewhat unsteady with a few falls falling backward however DTR reports this is not new, but has been ongoing. Pt seen at our Sports clinic in 2018 for low back pain issues in the setting of spinal stenosis.  PRECAUTIONS: N/A  SUBJECTIVE: Pt reports no pain, no falls/stumbles. Pt reports pain in her right foot that was evaluated by podiatrist and he provided her with a pad to go under her foot.   PAIN:  Are you having pain? Yes, in plantar, medial and lateral aspects of her R foot and into her left lower leg, pt has increased pain walking to about 5/10 and has concerns with R ankle inversion with balance tasks.     TODAY'S TREATMENT:   02/28/2022: Therapeutic Exercises:   -STS from standard height chair, no UE support, 2x12. Pt rates medium. Cuing for anterior weight shift. Improves eccentric control following cue.     6" step: -toe taps 10x 2 sets each LE      seated: GTB around knees: -march 15x each LE. Cuing for upright posture for improved engagement of postural muscles. Pt rates medium  RTB around ankles: alternating LAQ 12x ea, limited ROM to end range despite cues for full arc    Neuromuscular re-ed:  NBOS EO 2x30 sec,  intermittent UE support WBOS EC 30 sec 1 foot step, 1 foot floor x 30 se ea     Education provided throughout session via VC/TC and demonstration to facilitate movement at target joints and correct muscle activation for all testing and exercises performed.      PATIENT EDUCATION: Education details: exercise technique, body mechanics, sit to stand technique Person educated: Patient Education method: Explanation, Demonstration, Tactile cues, and Verbal cues Education comprehension: verbalized understanding, returned demonstration, verbal cues required, tactile cues required, and needs further education   HOME EXERCISE PROGRAM: Access Code: [email or web address]     PT Short Term Goals       PT SHORT TERM GOAL #1   Title Independence in HEP for improved balance and strength    Baseline Issued at eval    Time 3    Period Weeks    Status New    Target Date 01/11/22  PT SHORT TERM GOAL #2   Title 5xSTS hands free <19sec    Baseline Eval: >24sec hands free    Time 4    Period Weeks    Status New    Target Date 01/18/22      PT SHORT TERM GOAL #3   Title Ankle DF MMT 5/5 bilat.    Baseline Eval: 4+/5 Right, 4/5 Left    Time 4    Period Weeks    Status New    Target Date 01/18/22              PT Long Term Goals       PT LONG TERM GOAL #1   Title 6MWT > 127f c LRAD    Baseline July 2019: 13232f   Time 8    Period Weeks    Status New    Target Date 02/15/22      PT LONG TERM GOAL #2   Title 5xSTS <13 sec hands free    Baseline eval: >24sec hands free    Time 8    Period Weeks    Status New    Target Date 02/15/22      PT LONG TERM GOAL #3   Title Pt to demonstrate retroAMB without device >0.255ms LOB to demonstrate improved ankle righting and control.    Baseline Eval: <0.78m58mnd c 3 LOB    Time 10    Period Weeks    Status New    Target Date 03/01/22      PT LONG TERM GOAL #4   Title FOTO Score increase >15    Baseline eval: 48    Time 12     Period Weeks    Status New    Target Date 03/15/22              Plan     Clinical Impression Statement Continued with current plan of care as laid out in evaluation and recent prior sessions. Pt remains motivated to advance progress toward goals in order to maximize independence and safety at home. Pt requires high level assistance and cuing for completion of exercises in order to provide adequate level of stimulation and perturbation. Author allows pt as much opportunity as possible to perform independent righting strategies, only stepping in when pt is unable to prevent falling to floor. Pt closely monitored throughout session for safe vitals response and to maximize patient safety during interventions. Pt continues to demonstrate progress toward goals AEB progression of some interventions this date either in volume or intensity.Pt will continue to benefit from skilled physical therapy intervention to address impairments, improve QOL, and attain therapy goals.        Personal Factors and Comorbidities Past/Current Experience    Examination-Activity Limitations Dressing;Transfers;Bend;Locomotion Level;Stairs;Stand    Examination-Participation Restrictions Cleaning;Shop;Meal Prep;[employer] [place];Laundry    Stability/Clinical Decision Making Evolving/Moderate complexity    Rehab Potential Good    Clinical Impairments Affecting Rehab Potential (+) motivation, social support (-) age, chronicity of pain, fairly sedentary lifestyle    PT Frequency 2x / week    PT Duration 12 weeks    PT Treatment/Interventions Aquatic Therapy;Moist Heat;Functional mobility training;Dry needling;Neuromuscular re-education;Traction;Ultrasound;Iontophoresis '4mg'$ /ml Dexamethasone;ADLs/Self Care Home Management;Cryotherapy;Electrical Stimulation;Therapeutic exercise;Therapeutic activities;Patient/family education;Taping;Gait training;Stair training;DME Instruction;Balance training;Passive range of motion    PT Next Visit  Plan Progress safety with mobility and LE strengthening as appropriate.    PT Home Exercise Plan Eval: seated heel raises, toe raises, and STS from elevated surface. 01/04/2022=Access Code: [email or web address]  URL: [email or web address]    Consulted and Agree with Plan of Care Patient;Family member/caregiver    Family Member Consulted Rusty Aus PT  03/03/22

## 2022-03-03 NOTE — Therapy (Signed)
OUTPATIENT SPEECH LANGUAGE PATHOLOGY TREATMENT NOTE   Patient Name: Cassidy Hernandez MRN: [id number] DOB:04/07/1941, 81 y.o., female Today's Date: 03/03/2022   REFERRING PROVIDER: Merrilee Seashore, NP     End of Session - 03/02/22 1200     Visit Number 22    Number of Visits 25    Date for SLP Re-Evaluation 03/16/22    Authorization Type BCBS MCR; $10 copay    Authorization - Visit Number 11    Progress Note Due on Visit 10    SLP Start Time 1500    SLP Stop Time  1600    SLP Time Calculation (min) 60 min    Activity Tolerance Patient tolerated treatment well                 Past Medical History:  Diagnosis Date   Anginal pain ([place])    Aortic atherosclerosis    Cancer ([place])    Carotid artery stenosis 02/01/2010   a.) Doppler 16/02/3709: 62% LICA and 69% RICA. b.) Doppler 48/54/6270: >35% LICA and 00% RICA. c.) Doppler 10/25/16: >70% Bilater ICAs   Chronic bilateral low back pain with left-sided sciatica    CKD (chronic kidney disease), stage III ([facility])    Coronary artery disease    a.) LHC 12/24/2014 -> small LAD system; diffuse CTO of LAD. 60% RCA. LM and LCx with no sig disease. no intervention; med mgmt.   Degenerative arthritis    Diastolic dysfunction    a.) TTE 09/20/2018: EF 55%, G1DD, mild LAE and RVE, triv-mild panvalvular regurgitation.   Dysarthria    GERD (gastroesophageal reflux disease)    Hepatic steatosis    History of kidney stones    HLD (hyperlipidemia)    Hypertension    Kidney stone    Lumbar radiculopathy    Major depression in remission ([place])    PVD (peripheral vascular disease) ([place])    Renal cyst, right 06/02/2015   a.) CT 06/02/2015 --> 4.4 cm RIGHT renal mass; MRI recommended. b.) MRI 06/16/2015 --> 3.8 x 4.2 x 4.0 cm proteinaceous/hemmorhagic cyst.   Subclavian steal syndrome    a.) LHC 12/24/2014 --> tight eccentric 90% ostial stenosis with damping.   T2DM (type 2 diabetes mellitus) [facility])    Past Surgical History:  Procedure  Laterality Date   ABDOMINAL HYSTERECTOMY     AUGMENTATION MAMMAPLASTY Bilateral 1980   BACK SURGERY  09/2018   BICEPT TENODESIS  06/10/2021   Procedure: BICEPS TENODESIS;  Surgeon: Corky Mull, MD;  Location: [place] [facility];  Service: Orthopedics;;   CARDIAC CATHETERIZATION Left 12/24/2014   Procedure: LEFT HEARD CATHETERIZATION; Location: [facility]; Surgeon: Laurence Aly, MD   CHOLECYSTECTOMY     COLONOSCOPY N/A 02/11/2016   Procedure: COLONOSCOPY;  Surgeon: Manya Silvas, MD;  Location: [facility] ENDOSCOPY;  Service: Endoscopy;  Laterality: N/A;   IR CT HEAD LTD  11/16/2021   IR CT HEAD LTD  11/16/2021   IR INTRAVSC STENT CERV CAROTID W/O EMB-PROT MOD SED INC ANGIO  11/18/2021   IR PERCUTANEOUS ART THROMBECTOMY/INFUSION INTRACRANIAL INC DIAG ANGIO  11/16/2021   RADIOLOGY WITH ANESTHESIA N/A 11/16/2021   Procedure: IR WITH ANESTHESIA- CODE STROKE;  Surgeon: Radiologist, Medication, MD;  Location: [place];  Service: Radiology;  Laterality: N/A;   REVERSE SHOULDER ARTHROPLASTY Right 06/10/2021   Procedure: REVERSE SHOULDER ARTHROPLASTY;  Surgeon: Corky Mull, MD;  Location: [facility];  Service: Orthopedics;  Laterality: Right;   Patient Active Problem List   Diagnosis Date Noted   Benign hypertension  with CKD (chronic kidney disease) stage III ([place]) 03/02/2022   CAD (coronary artery disease) 03/02/2022   Hypercholesteremia 03/02/2022   Right hemiparesis ([place]) 12/16/2021   Acute ischemic left MCA stroke ([place]) 11/16/2021   Middle cerebral artery embolism, 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   Status post reverse arthroplasty of shoulder, right 06/10/2021   Traumatic complete tear of right rotator cuff 02/26/2021   Major depression in remission ([place]) 06/25/2020   Dysarthria 09/20/2018   Gastroesophageal reflux disease without esophagitis 09/20/2018   Lumbar radiculopathy 09/20/2018   Healthcare maintenance 05/02/2018   Controlled type 2 diabetes mellitus with stage 3 chronic kidney disease, without long-term current use of  insulin ([place]) 01/30/2018   Chronic bilateral low back pain with left-sided sciatica 12/07/2016   Hepatic steatosis 11/23/2015   Carotid artery disease ([place]) 01/06/2010    ONSET DATE: 11/16/21   REFERRING DIAG: L MCA CVA   HPI: Pt is a 81 y.o. 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 Patient is s/p thrombectomy with left ICA stent placement 11/16/21. She [facility] inpatient [facility] 11/25/21-12/07/21 and was discharged home with family. Patient known to this clinic from prior course of voice therapy in 2018. Pt with documented hx of voice and speech changes (dysarthria) per videostroboscopy 12/26/17; did not attend therapy.   THERAPY DIAG:  Aphasia  Dysarthria and anarthria  Rationale for Evaluation and Treatment Rehabilitation  SUBJECTIVE: Pt reports talking loudly on the phone  Pt accompanied by: sister Rod Holler  PAIN: No Are you having pain? No     OBJECTIVE:   TODAY'S TREATMENT: SLP reviewed HEP targeting dysarthria compensations to improve intelligibility with SLOP (Slow, loud, over-pronounce, pause); tongue twisters with mod models 80% acc, personal phrases min cues to repeat 3x with SLOP. Used auditory and visual models (recording) to increase Pt awareness of current intensity and reinforce need for increased intensity. SLP facilitated Talk Path sentence completion activity 89% acc. Prompted Pt to read sentences out loud with SLOP 85% acc. (66 dB). SLP prompted Pt to read conversational cue card question and answer 1x with min cue to repeat implementing SLOP.    PATIENT EDUCATION: Education details: SLOP strategies; intensity awareness Person educated: Patient Education method: Explanation, Demonstration, and Verbal cues Education comprehension: verbalized understanding and needs further education  SLP Short Term Goals - 02/24/22 1334       SLP SHORT TERM GOAL #1   Title Pt will communicate emergency information 100% accuracy using  visual aid if necessary.    Baseline 100% 01/24/22    Time 6    Period Weeks    Status Achieved      Target Date 01/27/22      SLP SHORT TERM GOAL #2   Title Pt will generate at least 4 descriptors of target word 80% of the time using semantic feature analysis to improve abilities in wordfinding and resolving communication breakdowns.    Baseline 85% 01/24/22    Time 6    Period Weeks    Status Achieved     Target Date 01/27/22      SLP SHORT TERM GOAL #3   Title Patient will request repetition/rephrasing to aid auditory comprehension of mod complex information (multi-step commands, conversation) in 80% of opportunities.    Baseline 85% 01/24/22    Time 6    Period Weeks    Status Achieved    Target Date 01/27/22      SLP SHORT TERM GOAL #4   Title Pt will  read and reply to simple texts/emails 80% accuracy with use of accessibility features as necessary.    Baseline 01/24/22, extended time, requires occasional cues, continues to require cuing with accessibility features    Time 6    Period Weeks    Status Partially Met    Target Date 01/27/22      SLP SHORT TERM GOAL #5   Title Pt will write sentences of 5-7 words 90% accuracy (self correction allowed) with occasional min cues.    Time 10    Period --   sessions   Status New      Additional Short Term Goals   Additional Short Term Goals Yes      SLP SHORT TERM GOAL #6   Title Pt will use intelligibility strategies (loudness, breath support, overarticulation) for sentence level responses 90% accuracy with min cues.    Time 10    Period --   sessions   Status New      SLP SHORT TERM GOAL #7   Title Pt will use aphasia compensations in 10 minutes moderately complex conversation with fewer than 3 listener questions necessary for clarification of message.    Time 10    Period --   sessions   Status New                   SLP Long Term Goals - 02/24/22 1348       SLP LONG TERM GOAL #1   Title Pt will engage in  5-8 minutes simple-mod complex conversation re: topic of interest with supported conversation, aphasia compensations.    Time 12    Period Weeks    Status Achieved      SLP LONG TERM GOAL #2   Title Pt will ID and attempt repair of communication breakdowns >80% of the time in session.    Time 12    Period Weeks    Status Achieved      SLP LONG TERM GOAL #3   Title Pt will demonstrate reading comprehension of mod complex short story or articles of interest with technology supports (text-to-speech, ereader, etc) by generating summary or answering questions >80% accuracy.    Baseline 02/23/22: Reads/comprehends simple paragraphs unassisted.    Time 12    Period Weeks    Status Revised   and renewed for 12 weeks   Target Date 03/08/22      SLP LONG TERM GOAL #4   Title Pt will self-advocate for communication needs in community or family outings (per her report) to increase confidence in social interactions.    Time 12    Period Weeks    Status New    Target Date 03/08/22      SLP LONG TERM GOAL #5   Title Pt will report reduced frustration with communication breakdowns by implementing aphasia compensation outside [place] room.    Baseline 02/24/22: frustrated 3 times per day on average    Time 12    Period Weeks    Status New    Target Date 03/08/22      Additional Long Term Goals   Additional Long Term Goals Yes      SLP LONG TERM GOAL #6   Title Pt will use intelligibility strategies (loudness, breath support, overarticulation) 80% accuracy in 10 minutes conversation with modified independence.    Time 12    Period Weeks    Status New    Target Date 05/25/22  Plan - 02/01/22 1121     Clinical Impression Statement Pt continues to present with moderate aphasia, with expressive deficits greater than receptive deficits. Pt reports increased intelligibility during phone conversation with sister by implementing SLOP. Pt reports feeling like she's yelling when  increasing intensity. Pt benefited from audio feedback and visual aids to increase awareness on current intensity and how it compared to SLP's conversational intensity. Pt required min to mod cues to carry over SLOP in activities and conversation. Pt is progressing toward goals and benefits from continued skilled ST intervention per plan of care.   Speech Therapy Frequency 2x / week    Duration 2 weeks    Treatment/Interventions SLP instruction and feedback;Multimodal communcation approach;Patient/family education;Functional tasks;Cueing hierarchy;Compensatory techniques;Compensatory strategies    Potential to Achieve Goals Good    Potential Considerations Family/community support    SLP Home Exercise Plan TalkPath Therapy    Consulted and Agree with Plan of Care Patient               Seth Bake 03/03/2022, 12:02 PM

## 2022-03-04 ENCOUNTER — Other Ambulatory Visit: Payer: Self-pay

## 2022-03-04 NOTE — Patient Outreach (Signed)
[facility] Care Management  03/04/2022  DOMINIQUA COONER 01/20/1941 [id number]   Telephone outreach to patient to obtain mRS was successfully completed. MRS= 2  [place] Care Management Assistant [phone]

## 2022-03-06 NOTE — Progress Notes (Signed)
  Subjective:  Patient ID: Cassidy Hernandez, female    DOB: 05/29/1941,  MRN: [id number]  Chief Complaint  Patient presents with   Callouses    (NP)R foot callus, 5th submet. H/O stroke, she favors the right side when walking    81 y.o. female presents with the above complaint. History confirmed with patient.  This becomes quite painful and she feels like she walks towards the outside of the foot.  Objective:  Physical Exam: warm, good capillary refill, no trophic changes or ulcerative lesions, normal DP and PT pulses, normal sensory exam, and painful hyperkeratotic lesion submetatarsal 5 on the right foot.  She has flexible and reducible mild pes cavovarus deformity.  Assessment:   1. Callus of foot   2. Pes cavus of right foot   3. Acquired varus deformity of right foot      Plan:  Patient was evaluated and treated and all questions answered.  Discussed etiology and treatment options for the hyperkeratotic lesion and the reason she gets this due to her mild pes cavovarus deformity secondary to her stroke.  She has increasing strength and she is currently in physical therapy for stroke rehab.  Hopefully this will be able to resolve with time and therapy.  She may require an insert or bracing to help with this.  The keratotic lesion was debrided as a courtesy today.  I discussed offloading with felted or foam pads and these were dispensed today.  I also recommended urea cream and pumice stone use daily.  We also discussed surgical correction with offloading osteotomy if this does not improve but would prefer to avoid this and a recent stroke patient  Return if symptoms worsen or fail to improve.

## 2022-03-08 ENCOUNTER — Ambulatory Visit: Payer: Medicare Other | Attending: Nurse Practitioner | Admitting: Speech Pathology

## 2022-03-08 ENCOUNTER — Ambulatory Visit: Payer: Medicare Other

## 2022-03-08 DIAGNOSIS — R4701 Aphasia: Secondary | ICD-10-CM | POA: Insufficient documentation

## 2022-03-08 DIAGNOSIS — R269 Unspecified abnormalities of gait and mobility: Secondary | ICD-10-CM | POA: Insufficient documentation

## 2022-03-08 DIAGNOSIS — I63512 Cerebral infarction due to unspecified occlusion or stenosis of left middle cerebral artery: Secondary | ICD-10-CM | POA: Insufficient documentation

## 2022-03-08 DIAGNOSIS — R2689 Other abnormalities of gait and mobility: Secondary | ICD-10-CM | POA: Diagnosis present

## 2022-03-08 DIAGNOSIS — R278 Other lack of coordination: Secondary | ICD-10-CM | POA: Insufficient documentation

## 2022-03-08 DIAGNOSIS — R2681 Unsteadiness on feet: Secondary | ICD-10-CM | POA: Insufficient documentation

## 2022-03-08 DIAGNOSIS — R262 Difficulty in walking, not elsewhere classified: Secondary | ICD-10-CM | POA: Diagnosis present

## 2022-03-08 DIAGNOSIS — R471 Dysarthria and anarthria: Secondary | ICD-10-CM | POA: Diagnosis present

## 2022-03-08 DIAGNOSIS — M79671 Pain in right foot: Secondary | ICD-10-CM | POA: Insufficient documentation

## 2022-03-08 DIAGNOSIS — M6281 Muscle weakness (generalized): Secondary | ICD-10-CM | POA: Insufficient documentation

## 2022-03-08 DIAGNOSIS — R296 Repeated falls: Secondary | ICD-10-CM | POA: Diagnosis present

## 2022-03-08 NOTE — Therapy (Signed)
OUTPATIENT PHYSICAL THERAPY TREATMENT NOTE   Patient Name: Cassidy Hernandez MRN: [id number] DOB:[birth year], 81 y.o., female Today's Date: 03/09/2022  PCP: Kirk Ruths, MD REFERRING PROVIDER: Rayann Heman, NP   PT End of Session - 03/09/22 1426     Visit Number 19    Number of Visits 24    Date for PT Re-Evaluation 03/15/22    Authorization Type BCBS Medicare    Authorization Time Period 12/21/21-03/15/22    Progress Note Due on Visit 10    PT Start Time 1429    PT Stop Time 1514    PT Time Calculation (min) 45 min    Equipment Utilized During Treatment Gait belt    Activity Tolerance Patient tolerated treatment well    Behavior During Therapy [facility] for tasks assessed/performed                Past Medical History:  Diagnosis Date   Anginal pain ([place])    Aortic atherosclerosis    Cancer ([place])    Carotid artery stenosis 02/01/2010   a.) Doppler 85/46/2703: 50% LICA and 09% RICA. b.) Doppler 38/18/2993: >71% LICA and 69% RICA. c.) Doppler 10/25/16: >70% Bilater ICAs   Chronic bilateral low back pain with left-sided sciatica    CKD (chronic kidney disease), stage III ([place])    Coronary artery disease    a.) LHC 12/24/2014 -> small LAD system; diffuse CTO of LAD. 60% RCA. LM and LCx with no sig disease. no intervention; med mgmt.   Degenerative arthritis    Diastolic dysfunction    a.) TTE 09/20/2018: EF 55%, G1DD, mild LAE and RVE, triv-mild panvalvular regurgitation.   Dysarthria    GERD (gastroesophageal reflux disease)    Hepatic steatosis    History of kidney stones    HLD (hyperlipidemia)    Hypertension    Kidney stone    Lumbar radiculopathy    Major depression in remission ([place])    PVD (peripheral vascular disease) ([place])    Renal cyst, right 06/02/2015   a.) CT 06/02/2015 --> 4.4 cm RIGHT renal mass; MRI recommended. b.) MRI 06/16/2015 --> 3.8 x 4.2 x 4.0 cm proteinaceous/hemmorhagic cyst.   Subclavian steal syndrome    a.) LHC 12/24/2014 --> tight  eccentric 90% ostial stenosis with damping.   T2DM (type 2 diabetes mellitus) [facility])    Past Surgical History:  Procedure Laterality Date   ABDOMINAL HYSTERECTOMY     AUGMENTATION MAMMAPLASTY Bilateral 1980   BACK SURGERY  09/2018   BICEPT TENODESIS  06/10/2021   Procedure: BICEPS TENODESIS;  Surgeon: Corky Mull, MD;  Location: [place] [facility];  Service: Orthopedics;;   CARDIAC CATHETERIZATION Left 12/24/2014   Procedure: LEFT HEARD CATHETERIZATION; Location: [facility]; Surgeon: Laurence Aly, MD   CHOLECYSTECTOMY     COLONOSCOPY N/A 02/11/2016   Procedure: COLONOSCOPY;  Surgeon: Manya Silvas, MD;  Location: [facility] ENDOSCOPY;  Service: Endoscopy;  Laterality: N/A;   IR CT HEAD LTD  11/16/2021   IR CT HEAD LTD  11/16/2021   IR INTRAVSC STENT CERV CAROTID W/O EMB-PROT MOD SED INC ANGIO  11/18/2021   IR PERCUTANEOUS ART THROMBECTOMY/INFUSION INTRACRANIAL INC DIAG ANGIO  11/16/2021   RADIOLOGY WITH ANESTHESIA N/A 11/16/2021   Procedure: IR WITH ANESTHESIA- CODE STROKE;  Surgeon: Radiologist, Medication, MD;  Location: [facility];  Service: Radiology;  Laterality: N/A;   REVERSE SHOULDER ARTHROPLASTY Right 06/10/2021   Procedure: REVERSE SHOULDER ARTHROPLASTY;  Surgeon: Corky Mull, MD;  Location: [facility];  Service: Orthopedics;  Laterality: Right;   Patient Active Problem List   Diagnosis Date Noted   Benign hypertension with CKD (chronic kidney disease) stage III ([place]) 03/02/2022   CAD (coronary artery disease) 03/02/2022   Hypercholesteremia 03/02/2022   Right hemiparesis ([place]) 12/16/2021   Acute ischemic left MCA stroke ([place]) 11/16/2021   Middle cerebral artery embolism, 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   Status post reverse arthroplasty of shoulder, right 06/10/2021   Traumatic complete tear of right rotator cuff 02/26/2021   Major depression in remission ([place]) 06/25/2020   Dysarthria 09/20/2018   Gastroesophageal reflux disease without esophagitis 09/20/2018   Lumbar radiculopathy 09/20/2018   Healthcare  maintenance 05/02/2018   Controlled type 2 diabetes mellitus with stage 3 chronic kidney disease, without long-term current use of insulin ([place]) 01/30/2018   Chronic bilateral low back pain with left-sided sciatica 12/07/2016   Hepatic steatosis 11/23/2015   Carotid artery disease ([place]) 01/06/2010    REFERRING DIAG: R42.706 (ICD-10-CM) - Cerebral infarction due to unspecified occlusion or stenosis of left middle cerebral artery  THERAPY DIAG:  Muscle weakness (generalized)  Difficulty in walking, not elsewhere classified  Unsteadiness on feet  Abnormality of gait and mobility  Rationale for Evaluation and Treatment Rehabilitation  PERTINENT HISTORY: Pt Recent L MCA, s/p thrombectomy and ICA stenting. Pt with speech deficits. Pt has been somewhat unsteady with a few falls falling backward however DTR reports this is not new, but has been ongoing. Pt seen at our Sports clinic in 2018 for low back pain issues in the setting of spinal stenosis.  PRECAUTIONS: N/A  SUBJECTIVE: Patient reports her right foot is turning out and physician thinks she may need a brace in addition to exercises.   PAIN:  Are you having pain? Yes, in plantar, medial and lateral aspects of her R foot and into her left lower leg, pt has increased pain walking to about 5/10 and has concerns with R ankle inversion with balance tasks.     TODAY'S TREATMENT:    Therapeutic Exercises:  Nustep 3 ; seat position 5; cues for increased velocity for musculoskeletal challenge x 4 minutes  HEP performance 10x each intervention.   -STS from standard height chair, no UE support, 2x12. Pt rates medium. Cuing for anterior weight shift. Improves eccentric control following cue.     6" step toe taps 10x each LE with UE support  Standing throw ball at target 15x   seated: GTB around knees: -march 15x each LE. Cuing for upright posture for improved engagement of postural muscles. Pt rates medium  RTB around ankles:  alternating LAQ 12x ea, limited ROM to end range despite cues for full arc  RTB abduction 15x Adduction ball squeeze 15x  Walk 80 ft with CGA    Education provided throughout session via VC/TC and demonstration to facilitate movement at target joints and correct muscle activation for all testing and exercises performed.      PATIENT EDUCATION: Education details: exercise technique, body mechanics, sit to stand technique Person educated: Patient Education method: Explanation, Demonstration, Tactile cues, and Verbal cues Education comprehension: verbalized understanding, returned demonstration, verbal cues required, tactile cues required, and needs further education   HOME EXERCISE PROGRAM: Access Code: [email or web address]    Access Code: [email or web address] URL: [email or web address] Date: 03/09/2022 Prepared by: Janna Arch  Exercises - Seated Ankle Dorsiflexion with Resistance  - 1 x daily - 7 x weekly - 2 sets - 10 reps - 5 hold - Seated Ankle Plantarflexion with Resistance  - 1 x daily - 7  x weekly - 2 sets - 10 reps - 5 hold - Seated Ankle Inversion with Anchored Resistance  - 1 x daily - 7 x weekly - 2 sets - 10 reps - 5 hold - Seated Ankle Eversion with Resistance  - 1 x daily - 7 x weekly - 2 sets - 10 reps - 5 hold - Seated Toe Towel Scrunches  - 1 x daily - 7 x weekly - 2 sets - 10 reps - 5 hold - Seated Marble Pick-Up with Toes  - 1 x daily - 7 x weekly - 2 sets - 10 reps - 5 hold   PT Short Term Goals       PT SHORT TERM GOAL #1   Title Independence in HEP for improved balance and strength    Baseline Issued at eval    Time 3    Period Weeks    Status New    Target Date 01/11/22      PT SHORT TERM GOAL #2   Title 5xSTS hands free <19sec    Baseline Eval: >24sec hands free    Time 4    Period Weeks    Status New    Target Date 01/18/22      PT SHORT TERM GOAL #3   Title Ankle DF MMT 5/5 bilat.    Baseline Eval: 4+/5 Right, 4/5 Left    Time 4    Period Weeks     Status New    Target Date 01/18/22              PT Long Term Goals       PT LONG TERM GOAL #1   Title 6MWT > 1280f c LRAD    Baseline July 2019: 13253f   Time 8    Period Weeks    Status New    Target Date 02/15/22      PT LONG TERM GOAL #2   Title 5xSTS <13 sec hands free    Baseline eval: >24sec hands free    Time 8    Period Weeks    Status New    Target Date 02/15/22      PT LONG TERM GOAL #3   Title Pt to demonstrate retroAMB without device >0.2524ms LOB to demonstrate improved ankle righting and control.    Baseline Eval: <0.42m70mnd c 3 LOB    Time 10    Period Weeks    Status New    Target Date 03/01/22      PT LONG TERM GOAL #4   Title FOTO Score increase >15    Baseline eval: 48    Time 12    Period Weeks    Status New    Target Date 03/15/22              Plan     Clinical Impression Statement Patient educated on ankle and foot strengthening HEP and demonstrated understanding. Very challenging for patient to perform toe flexion which will continue to be an area of focus.  Pt will continue to benefit from skilled physical therapy intervention to address impairments, improve QOL, and attain therapy goals.        Personal Factors and Comorbidities Past/Current Experience    Examination-Activity Limitations Dressing;Transfers;Bend;Locomotion Level;Stairs;Stand    Examination-Participation Restrictions Cleaning;Shop;Meal Prep;YardDorita SciaraStability/Clinical Decision Making Evolving/Moderate complexity    Rehab Potential Good    Clinical Impairments Affecting Rehab Potential (+) motivation, social support (-) age, chronicity of  pain, fairly sedentary lifestyle    PT Frequency 2x / week    PT Duration 12 weeks    PT Treatment/Interventions Aquatic Therapy;Moist Heat;Functional mobility training;Dry needling;Neuromuscular re-education;Traction;Ultrasound;Iontophoresis '4mg'$ /ml Dexamethasone;ADLs/Self Care Home  Management;Cryotherapy;Electrical Stimulation;Therapeutic exercise;Therapeutic activities;Patient/family education;Taping;Gait training;Stair training;DME Instruction;Balance training;Passive range of motion    PT Next Visit Plan Progress safety with mobility and LE strengthening as appropriate.    PT Home Exercise Plan Eval: seated heel raises, toe raises, and STS from elevated surface. 01/04/2022=Access Code: [email or web address]  URL: [email or web address]    Consulted and Agree with Plan of Care Patient;Family member/caregiver    Family Member Consulted Eino Farber PT  03/09/22

## 2022-03-08 NOTE — Therapy (Signed)
OUTPATIENT SPEECH LANGUAGE PATHOLOGY TREATMENT NOTE   Patient Name: Cassidy Hernandez MRN: [id number] DOB:Jan 03, 1941, 81 y.o., female Today's Date: 03/08/2022   REFERRING PROVIDER: Merrilee Seashore, NP     End of Session - 03/08/22 1643     Visit Number 23    Number of Visits 25    Date for SLP Re-Evaluation 03/16/22    Authorization Type BCBS MCR; $10 copay    Authorization - Visit Number 3    Progress Note Due on Visit 10    SLP Start Time 1500    SLP Stop Time  1600    SLP Time Calculation (min) 60 min    Activity Tolerance Patient tolerated treatment well                 Past Medical History:  Diagnosis Date   Anginal pain ([place])    Aortic atherosclerosis    Cancer ([place])    Carotid artery stenosis 02/01/2010   a.) Doppler 40/98/1191: 47% LICA and 82% RICA. b.) Doppler 95/62/1308: >65% LICA and 78% RICA. c.) Doppler 10/25/16: >70% Bilater ICAs   Chronic bilateral low back pain with left-sided sciatica    CKD (chronic kidney disease), stage III ([facility])    Coronary artery disease    a.) LHC 12/24/2014 -> small LAD system; diffuse CTO of LAD. 60% RCA. LM and LCx with no sig disease. no intervention; med mgmt.   Degenerative arthritis    Diastolic dysfunction    a.) TTE 09/20/2018: EF 55%, G1DD, mild LAE and RVE, triv-mild panvalvular regurgitation.   Dysarthria    GERD (gastroesophageal reflux disease)    Hepatic steatosis    History of kidney stones    HLD (hyperlipidemia)    Hypertension    Kidney stone    Lumbar radiculopathy    Major depression in remission ([place])    PVD (peripheral vascular disease) ([place])    Renal cyst, right 06/02/2015   a.) CT 06/02/2015 --> 4.4 cm RIGHT renal mass; MRI recommended. b.) MRI 06/16/2015 --> 3.8 x 4.2 x 4.0 cm proteinaceous/hemmorhagic cyst.   Subclavian steal syndrome    a.) LHC 12/24/2014 --> tight eccentric 90% ostial stenosis with damping.   T2DM (type 2 diabetes mellitus) [facility])    Past Surgical History:  Procedure Laterality  Date   ABDOMINAL HYSTERECTOMY     AUGMENTATION MAMMAPLASTY Bilateral 1980   BACK SURGERY  09/2018   BICEPT TENODESIS  06/10/2021   Procedure: BICEPS TENODESIS;  Surgeon: Corky Mull, MD;  Location: [facility];  Service: Orthopedics;;   CARDIAC CATHETERIZATION Left 12/24/2014   Procedure: LEFT HEARD CATHETERIZATION; Location: [facility]; Surgeon: Laurence Aly, MD   CHOLECYSTECTOMY     COLONOSCOPY N/A 02/11/2016   Procedure: COLONOSCOPY;  Surgeon: Manya Silvas, MD;  Location: [facility] ENDOSCOPY;  Service: Endoscopy;  Laterality: N/A;   IR CT HEAD LTD  11/16/2021   IR CT HEAD LTD  11/16/2021   IR INTRAVSC STENT CERV CAROTID W/O EMB-PROT MOD SED INC ANGIO  11/18/2021   IR PERCUTANEOUS ART THROMBECTOMY/INFUSION INTRACRANIAL INC DIAG ANGIO  11/16/2021   RADIOLOGY WITH ANESTHESIA N/A 11/16/2021   Procedure: IR WITH ANESTHESIA- CODE STROKE;  Surgeon: Radiologist, Medication, MD;  Location: [facility];  Service: Radiology;  Laterality: N/A;   REVERSE SHOULDER ARTHROPLASTY Right 06/10/2021   Procedure: REVERSE SHOULDER ARTHROPLASTY;  Surgeon: Corky Mull, MD;  Location: [facility];  Service: Orthopedics;  Laterality: Right;   Patient Active Problem List   Diagnosis Date Noted   Benign hypertension  with CKD (chronic kidney disease) stage III ([place]) 03/02/2022   CAD (coronary artery disease) 03/02/2022   Hypercholesteremia 03/02/2022   Right hemiparesis ([place]) 12/16/2021   Acute ischemic left MCA stroke ([place]) 11/16/2021   Middle cerebral artery embolism, 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   Status post reverse arthroplasty of shoulder, right 06/10/2021   Traumatic complete tear of right rotator cuff 02/26/2021   Major depression in remission ([place]) 06/25/2020   Dysarthria 09/20/2018   Gastroesophageal reflux disease without esophagitis 09/20/2018   Lumbar radiculopathy 09/20/2018   Healthcare maintenance 05/02/2018   Controlled type 2 diabetes mellitus with stage 3 chronic kidney disease, without long-term current use of insulin ([place])  01/30/2018   Chronic bilateral low back pain with left-sided sciatica 12/07/2016   Hepatic steatosis 11/23/2015   Carotid artery disease ([place]) 01/06/2010    ONSET DATE: 11/16/21   REFERRING DIAG: L MCA CVA   HPI: Pt is a 81 y.o. 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 Patient is s/p thrombectomy with left ICA stent placement 11/16/21. She completed inpatient rehabilitation at [facility] 11/25/21-12/07/21 and was discharged home with family. Patient known to this clinic from prior course of voice therapy in 2018. Pt with documented hx of voice and speech changes (dysarthria) per videostroboscopy 12/26/17; did not attend therapy.   THERAPY DIAG:  Aphasia  Dysarthria and anarthria  Rationale for Evaluation and Treatment Rehabilitation  SUBJECTIVE: Pt reports talking loudly on the phone  Pt accompanied by: sister Rod Holler  PAIN: No Are you having pain? No     OBJECTIVE:   TODAY'S TREATMENT: Pt completed HEP for dysarthria with occasional min cues for slowing rate, increasing intensity, average 75 dB for word/phrase level tasks. Word and phrase level reading with dysarthria compensations with occasional min-mod cues necessary (verbal, demonstration) when cognitive load added. Pt used strategies ~80% of the time. Targeted dysarthria and reading comprehension with read-aloud task of simple news paragraphs re: pt interests [employer], [employer]). Pt maintained average 72 dB with occasional min-mod cues, intelligibility 95%. Mod cues for carryover into simple-mod complex conversation.   PATIENT EDUCATION: Education details: maintaining adequate hydration to support vocal hygiene Person educated: Patient Education method: Explanation, Demonstration, and Verbal cues Education comprehension: verbalized understanding and needs further education    SLP Short Term Goals - 02/24/22 1334       SLP SHORT TERM GOAL #1   Title Pt will communicate emergency information 100%  accuracy using visual aid if necessary.    Baseline 100% 01/24/22    Time 6    Period Weeks    Status Achieved   Will reassess next visit.   Target Date 01/27/22      SLP SHORT TERM GOAL #2   Title Pt will generate at least 4 descriptors of target word 80% of the time using semantic feature analysis to improve abilities in wordfinding and resolving communication breakdowns.    Baseline 85% 01/24/22    Time 6    Period Weeks    Status Achieved   Will reassess next visit.   Target Date 01/27/22      SLP SHORT TERM GOAL #3   Title Patient will request repetition/rephrasing to aid auditory comprehension of mod complex information (multi-step commands, conversation) in 80% of opportunities.    Baseline 85% 01/24/22    Time 6    Period Weeks    Status Achieved   Will reassess next visit.   Target Date 01/27/22      SLP SHORT  TERM GOAL #4   Title Pt will read and reply to simple texts/emails 80% accuracy with use of accessibility features as necessary.    Baseline 01/24/22, extended time, requires occasional cues, continues to require cuing with accessibility features    Time 6    Period Weeks    Status Partially Met   Will reassess next visit.   Target Date 01/27/22      SLP SHORT TERM GOAL #5   Title Pt will write sentences of 5-7 words 90% accuracy (self correction allowed) with occasional min cues.    Time 10    Period --   sessions   Status New      Additional Short Term Goals   Additional Short Term Goals Yes      SLP SHORT TERM GOAL #6   Title Pt will use intelligibility strategies (loudness, breath support, overarticulation) for sentence level responses 90% accuracy with min cues.    Time 10    Period --   sessions   Status New      SLP SHORT TERM GOAL #7   Title Pt will use aphasia compensations in 10 minutes moderately complex conversation with fewer than 3 listener questions necessary for clarification of message.    Time 10    Period --   sessions   Status New               SLP Long Term Goals - 02/24/22 1348       SLP LONG TERM GOAL #1   Title Pt will engage in 5-8 minutes simple-mod complex conversation re: topic of interest with supported conversation, aphasia compensations.    Time 12    Period Weeks    Status Achieved      SLP LONG TERM GOAL #2   Title Pt will ID and attempt repair of communication breakdowns >80% of the time in session.    Time 12    Period Weeks    Status Achieved      SLP LONG TERM GOAL #3   Title Pt will demonstrate reading comprehension of mod complex short story or articles of interest with technology supports (text-to-speech, ereader, etc) by generating summary or answering questions >80% accuracy.    Baseline 02/23/22: Reads/comprehends simple paragraphs unassisted.    Time 12    Period Weeks    Status Revised   and renewed for 12 weeks   Target Date 03/08/22      SLP LONG TERM GOAL #4   Title Pt will self-advocate for communication needs in community or family outings (per her report) to increase confidence in social interactions.    Time 12    Period Weeks    Status New    Target Date 03/08/22      SLP LONG TERM GOAL #5   Title Pt will report reduced frustration with communication breakdowns by implementing aphasia compensation outside [place] room.    Baseline 02/24/22: frustrated 3 times per day on average    Time 12    Period Weeks    Status New    Target Date 03/08/22      Additional Long Term Goals   Additional Long Term Goals Yes      SLP LONG TERM GOAL #6   Title Pt will use intelligibility strategies (loudness, breath support, overarticulation) 80% accuracy in 10 minutes conversation with modified independence.    Time 12    Period Weeks    Status New    Target Date 05/25/22  Plan    Clinical Impression Statement Pt's aphasia appearing more mild-moderate in recent sessions, and dysarthria/vocal quality/intelligibility significantly improved with use of compensations, for  which pt requires min-moderate cues in spontaneous conversation. Pt is progressing toward goals and benefits from continued skilled ST intervention per plan of care.   Speech Therapy Frequency 2x / week    Duration 2 weeks    Treatment/Interventions SLP instruction and feedback;Multimodal communcation approach;Patient/family education;Functional tasks;Cueing hierarchy;Compensatory techniques;Compensatory strategies    Potential to Achieve Goals Good    Potential Considerations Family/community support    SLP Home Exercise Plan [facility] Therapy    Consulted and Agree with Plan of Care Patient              Deneise Lever, MS, CCC-SLP Speech-Language Pathologist [phone]  Aliene Altes, [place] 03/08/2022, 4:44 PM

## 2022-03-09 ENCOUNTER — Ambulatory Visit: Payer: Medicare Other

## 2022-03-09 DIAGNOSIS — R262 Difficulty in walking, not elsewhere classified: Secondary | ICD-10-CM

## 2022-03-09 DIAGNOSIS — M6281 Muscle weakness (generalized): Secondary | ICD-10-CM

## 2022-03-09 DIAGNOSIS — R4701 Aphasia: Secondary | ICD-10-CM | POA: Diagnosis not present

## 2022-03-09 DIAGNOSIS — R269 Unspecified abnormalities of gait and mobility: Secondary | ICD-10-CM

## 2022-03-09 DIAGNOSIS — R2681 Unsteadiness on feet: Secondary | ICD-10-CM

## 2022-03-10 ENCOUNTER — Other Ambulatory Visit: Payer: Self-pay | Admitting: Internal Medicine

## 2022-03-10 ENCOUNTER — Ambulatory Visit: Payer: Medicare Other | Admitting: Speech Pathology

## 2022-03-10 ENCOUNTER — Ambulatory Visit: Payer: Medicare Other

## 2022-03-10 DIAGNOSIS — R4701 Aphasia: Secondary | ICD-10-CM | POA: Diagnosis not present

## 2022-03-10 DIAGNOSIS — M6281 Muscle weakness (generalized): Secondary | ICD-10-CM

## 2022-03-10 DIAGNOSIS — Z1231 Encounter for screening mammogram for malignant neoplasm of breast: Secondary | ICD-10-CM

## 2022-03-10 DIAGNOSIS — R471 Dysarthria and anarthria: Secondary | ICD-10-CM

## 2022-03-10 DIAGNOSIS — R2681 Unsteadiness on feet: Secondary | ICD-10-CM

## 2022-03-10 DIAGNOSIS — R262 Difficulty in walking, not elsewhere classified: Secondary | ICD-10-CM

## 2022-03-10 NOTE — Therapy (Signed)
OUTPATIENT PHYSICAL THERAPY TREATMENT NOTE/Physical Therapy Progress Note Visit 20      Dates of reporting period  02/02/2022   to   03/10/2022   Patient Name: Cassidy Hernandez MRN: [id number] DOB:10-29-1940, 81 y.o., female Today's Date: 03/10/2022  PCP: Kirk Ruths, MD REFERRING PROVIDER: Rayann Heman, NP   PT End of Session - 03/10/22 1502     Visit Number 20    Number of Visits 24    Date for PT Re-Evaluation 03/15/22    Authorization Type BCBS Medicare    Authorization Time Period 12/21/21-03/15/22    Progress Note Due on Visit 10    PT Start Time 1508    PT Stop Time 1550    PT Time Calculation (min) 42 min    Equipment Utilized During Treatment Gait belt    Activity Tolerance Patient tolerated treatment well;No increased pain    Behavior During Therapy [facility] for tasks assessed/performed                 Past Medical History:  Diagnosis Date   Anginal pain ([place])    Aortic atherosclerosis    Cancer ([place])    Carotid artery stenosis 02/01/2010   a.) Doppler 32/35/5732: 20% LICA and 25% RICA. b.) Doppler 42/70/6237: >62% LICA and 83% RICA. c.) Doppler 10/25/16: >70% Bilater ICAs   Chronic bilateral low back pain with left-sided sciatica    CKD (chronic kidney disease), stage III (HCC)    Coronary artery disease    a.) LHC 12/24/2014 -> small LAD system; diffuse CTO of LAD. 60% RCA. LM and LCx with no sig disease. no intervention; med mgmt.   Degenerative arthritis    Diastolic dysfunction    a.) TTE 09/20/2018: EF 55%, G1DD, mild LAE and RVE, triv-mild panvalvular regurgitation.   Dysarthria    GERD (gastroesophageal reflux disease)    Hepatic steatosis    History of kidney stones    HLD (hyperlipidemia)    Hypertension    Kidney stone    Lumbar radiculopathy    Major depression in remission ([place])    PVD (peripheral vascular disease) ([place])    Renal cyst, right 06/02/2015   a.) CT 06/02/2015 --> 4.4 cm RIGHT renal mass; MRI recommended. b.) MRI 06/16/2015  --> 3.8 x 4.2 x 4.0 cm proteinaceous/hemmorhagic cyst.   Subclavian steal syndrome    a.) LHC 12/24/2014 --> tight eccentric 90% ostial stenosis with damping.   T2DM (type 2 diabetes mellitus) [facility])    Past Surgical History:  Procedure Laterality Date   ABDOMINAL HYSTERECTOMY     AUGMENTATION MAMMAPLASTY Bilateral 1980   BACK SURGERY  09/2018   BICEPT TENODESIS  06/10/2021   Procedure: BICEPS TENODESIS;  Surgeon: Corky Mull, MD;  Location: [facility];  Service: Orthopedics;;   CARDIAC CATHETERIZATION Left 12/24/2014   Procedure: LEFT HEARD CATHETERIZATION; Location: [facility]; Surgeon: Laurence Aly, MD   CHOLECYSTECTOMY     COLONOSCOPY N/A 02/11/2016   Procedure: COLONOSCOPY;  Surgeon: Manya Silvas, MD;  Location: [facility] END[facility]COPY;  Service: Endoscopy;  Laterality: N/A;   IR CT HEAD LTD  11/16/2021   IR CT HEAD LTD  11/16/2021   IR INTRAVSC STENT CERV CAROTID W/O EMB-PROT MOD SED INC ANGIO  11/18/2021   IR PERCUTANEOUS ART THROMBECTOMY/INFUSION INTRACRANIAL INC DIAG ANGIO  11/16/2021   RADIOLOGY WITH ANESTHESIA N/A 11/16/2021   Procedure: IR WITH ANESTHESIA- CODE STROKE;  Surgeon: Radiologist, Medication, MD;  Location: [place];  Service: Radiology;  Laterality: N/A;  REVERSE SHOULDER ARTHROPLASTY Right 06/10/2021   Procedure: REVERSE SHOULDER ARTHROPLASTY;  Surgeon: Corky Mull, MD;  Location: [facility];  Service: Orthopedics;  Laterality: Right;   Patient Active Problem List   Diagnosis Date Noted   Benign hypertension with CKD (chronic kidney disease) stage III ([place]) 03/02/2022   CAD (coronary artery disease) 03/02/2022   Hypercholesteremia 03/02/2022   Right hemiparesis ([place]) 12/16/2021   Acute ischemic left MCA stroke ([place]) 11/16/2021   Middle cerebral artery embolism, 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   Status post reverse arthroplasty of shoulder, right 06/10/2021   Traumatic complete tear of right rotator cuff 02/26/2021   Major depression in remission ([place]) 06/25/2020   Dysarthria 09/20/2018    Gastroesophageal reflux disease without esophagitis 09/20/2018   Lumbar radiculopathy 09/20/2018   Healthcare maintenance 05/02/2018   Controlled type 2 diabetes mellitus with stage 3 chronic kidney disease, without long-term current use of insulin ([place]) 01/30/2018   Chronic bilateral low back pain with left-sided sciatica 12/07/2016   Hepatic steatosis 11/23/2015   Carotid artery disease ([place]) 01/06/2010    REFERRING DIAG: T26.712 (ICD-10-CM) - Cerebral infarction due to unspecified occlusion or stenosis of left middle cerebral artery  THERAPY DIAG:  Muscle weakness (generalized)  Unsteadiness on feet  Difficulty in walking, not elsewhere classified  Rationale for Evaluation and Treatment Rehabilitation  PERTINENT HISTORY: Pt Recent L MCA, s/p thrombectomy and ICA stenting. Pt with speech deficits. Pt has been somewhat unsteady with a few falls falling backward however DTR reports this is not new, but has been ongoing. Pt seen at our Sports clinic in 2018 for low back pain issues in the setting of spinal stenosis.  PRECAUTIONS: N/A  SUBJECTIVE:  Pt reports difficulties with R foot supination. She reports no pain in her R foot when sitting/at rest, but she does still feel pain when walking. Pt reports no other concerns at the moment.      PAIN:  Are you having pain? None at rest, but does feel pain when walking.   TODAY'S TREATMENT:   03/10/2022  Retesting of goals for progress note. Please refer to goal section for full details. PT instructed pt in testing technique throughout and explained indications of test performance.   Other interventions:  Seated Marble Pick-Up with Toes performed B to promote carryover to affected side. Pt able to pick up 1-2 marbles with RLE but does not feel this.     Education provided throughout session via VC/TC and demonstration to facilitate movement at target joints and correct muscle activation for all testing and exercises performed.  Education provided on benefits and importance of consistently performing HEP.  Pt reports no pain with any testing or interventions performed on this date.     PATIENT EDUCATION: Education details: exercise technique, body mechanics, goals, indications for progress/plan Person educated: Patient Education method: Explanation, Demonstration, Tactile cues, and Verbal cues Education comprehension: verbalized understanding, returned demonstration, verbal cues required, tactile cues required, and needs further education   HOME EXERCISE PROGRAM: Access Code: [email or web address]    Access Code: [email or web address] URL: [email or web address] Date: 03/09/2022 Prepared by: Janna Arch  Exercises - Seated Ankle Dorsiflexion with Resistance  - 1 x daily - 7 x weekly - 2 sets - 10 reps - 5 hold - Seated Ankle Plantarflexion with Resistance  - 1 x daily - 7 x weekly - 2 sets - 10 reps - 5 hold - Seated Ankle Inversion with Anchored Resistance  - 1 x daily - 7 x weekly - 2 sets - 10  reps - 5 hold - Seated Ankle Eversion with Resistance  - 1 x daily - 7 x weekly - 2 sets - 10 reps - 5 hold - Seated Toe Towel Scrunches  - 1 x daily - 7 x weekly - 2 sets - 10 reps - 5 hold - Seated Marble Pick-Up with Toes  - 1 x daily - 7 x weekly - 2 sets - 10 reps - 5 hold   PT Short Term Goals       PT SHORT TERM GOAL #1   Title Independence in HEP for improved balance and strength    Baseline Issued at eval  8/3: Pt reports she is performing her HEP every other day, not performing all her exercises   Time 3    Period Weeks    Status On-going    Target Date 03/31/2022      PT SHORT TERM GOAL #2   Title 5xSTS hands free <19sec in order to indicate decreased fall risk   Baseline Eval: >24sec hands free; 8/3:  32 sec hands free, CGA   Time 4    Period Weeks    Status Revised    Target Date 03/31/2022      PT SHORT TERM GOAL #3   Title The pt will demonstrate Ankle DF MMT 5/5 bilat for improved gait mechanics  and functional mobility   Baseline Eval: 4+/5 Right, 4/5 Left; 8/3: 4+/5 B   Time 4    Period Weeks    Status Revised     Target Date 03/31/2022              PT Long Term Goals       PT LONG TERM GOAL #1   Title Pt will ambulate at least 1000 ft on the 6MWT for progression to community ambulator and improve gait ability   Baseline July 2019: 1376f; 03/10/22: 580 ft with 4WW   Time 8    Period Weeks    Status Revised    Target Date 05/05/2022      PT LONG TERM GOAL #2   Title Pt will complete 5xSTS <13 sec hands free to indicate decreased fall risk and increased BLE power.   Baseline eval: >24sec hands free; 8/3: 32 sec hands free, CGA   Time 8    Period Weeks    Status Revised    Target Date 05/05/2022      PT LONG TERM GOAL #3   Title Pt to demonstrate retroAMB without device >0.239m s LOB to demonstrate improved ankle righting and control.    Baseline Eval: <0.0129mand c 3 LOB 03/10/22: .08 m/s with 4WW    Time 10    Period Weeks    Status New    Target Date 05/05/2022      PT LONG TERM GOAL #4   Title The pt FOTO Score will increase by >15 points to indicate improved functional mobility and QOL    Baseline eval: 48; 03/10/2022: 61% (previously 62% on 02/02/22)    Time 12    Period Weeks    Status Achieved (revised to indicate functional purpose of goal)    Target Date 05/05/2022              Plan     Clinical Impression Statement Goals reassessed and revised for progress note, including updating of target dates. Pt shows progress with B DF strength with MMT today. Pt 5xSTS score still indicates she is at an increased risk for  future falls, and 6MWT score indicates impaired gait ability and functional capacity. PT encouraged pt to perform HEP consistently and explained benefits of compliance with HEP as pt reports inconsistent performance. The pt will continue to benefit from skilled physical therapy intervention to address impairments, improve QOL, and attain therapy  goals.        Personal Factors and Comorbidities Past/Current Experience    Examination-Activity Limitations Dressing;Transfers;Bend;Locomotion Level;Stairs;Stand    Examination-Participation Restrictions Cleaning;Shop;Meal Prep;[employer] Work;Laundry    Stability/Clinical Decision Making Evolving/Moderate complexity    Rehab Potential Good    Clinical Impairments Affecting Rehab Potential (+) motivation, social support (-) age, chronicity of pain, fairly sedentary lifestyle    PT Frequency 2x / week    PT Duration 12 weeks    PT Treatment/Interventions Aquatic Therapy;Moist Heat;Functional mobility training;Dry needling;Neuromuscular re-education;Traction;Ultrasound;Iontophoresis '4mg'$ /ml Dexamethasone;ADLs/Self Care Home Management;Cryotherapy;Electrical Stimulation;Therapeutic exercise;Therapeutic activities;Patient/family education;Taping;Gait training;Stair training;DME Instruction;Balance training;Passive range of motion    PT Next Visit Plan Progress safety with mobility and LE strengthening as appropriate.    PT Home Exercise Plan Eval: seated heel raises, toe raises, and STS from elevated surface. 01/04/2022=Access Code: [email or web address]  URL: [email or web address]    Consulted and Agree with Plan of Care Patient;Family member/caregiver    Family Member Consulted Leighton Parody PT  03/10/22

## 2022-03-10 NOTE — Therapy (Signed)
OUTPATIENT SPEECH LANGUAGE PATHOLOGY TREATMENT NOTE   Patient Name: Cassidy Hernandez MRN: [id number] DOB:10-02-1940, 81 y.o., female Today's Date: 03/10/2022   REFERRING PROVIDER: Merrilee Seashore, NP     End of Session - 03/10/22 1407     Visit Number 24    Number of Visits 25    Date for SLP Re-Evaluation 03/16/22    Authorization Type BCBS MCR; $10 copay    Authorization - Visit Number 4    Progress Note Due on Visit 10    SLP Start Time 1400    SLP Stop Time  1500    SLP Time Calculation (min) 60 min    Activity Tolerance Patient tolerated treatment well                 Past Medical History:  Diagnosis Date   Anginal pain ([place])    Aortic atherosclerosis    Cancer ([place])    Carotid artery stenosis 02/01/2010   a.) Doppler 47/42/5956: 38% LICA and 75% RICA. b.) Doppler 64/33/2951: >88% LICA and 41% RICA. c.) Doppler 10/25/16: >70% Bilater ICAs   Chronic bilateral low back pain with left-sided sciatica    CKD (chronic kidney disease), stage III (HCC)    Coronary artery disease    a.) LHC 12/24/2014 -> small LAD system; diffuse CTO of LAD. 60% RCA. LM and LCx with no sig disease. no intervention; med mgmt.   Degenerative arthritis    Diastolic dysfunction    a.) TTE 09/20/2018: EF 55%, G1DD, mild LAE and RVE, triv-mild panvalvular regurgitation.   Dysarthria    GERD (gastroesophageal reflux disease)    Hepatic steatosis    History of kidney stones    HLD (hyperlipidemia)    Hypertension    Kidney stone    Lumbar radiculopathy    Major depression in remission ([place])    PVD (peripheral vascular disease) ([place])    Renal cyst, right 06/02/2015   a.) CT 06/02/2015 --> 4.4 cm RIGHT renal mass; MRI recommended. b.) MRI 06/16/2015 --> 3.8 x 4.2 x 4.0 cm proteinaceous/hemmorhagic cyst.   Subclavian steal syndrome    a.) LHC 12/24/2014 --> tight eccentric 90% ostial stenosis with damping.   T2DM (type 2 diabetes mellitus) [facility])    Past Surgical History:  Procedure Laterality  Date   ABDOMINAL HYSTERECTOMY     AUGMENTATION MAMMAPLASTY Bilateral 1980   BACK SURGERY  09/2018   BICEPT TENODESIS  06/10/2021   Procedure: BICEPS TENODESIS;  Surgeon: Corky Mull, MD;  Location: [place] [facility];  Service: Orthopedics;;   CARDIAC CATHETERIZATION Left 12/24/2014   Procedure: LEFT HEARD CATHETERIZATION; Location: [facility]; Surgeon: Laurence Aly, MD   CHOLECYSTECTOMY     COLONOSCOPY N/A 02/11/2016   Procedure: COLONOSCOPY;  Surgeon: Manya Silvas, MD;  Location: [facility] ENDOSCOPY;  Service: Endoscopy;  Laterality: N/A;   IR CT HEAD LTD  11/16/2021   IR CT HEAD LTD  11/16/2021   IR INTRAVSC STENT CERV CAROTID W/O EMB-PROT MOD SED INC ANGIO  11/18/2021   IR PERCUTANEOUS ART THROMBECTOMY/INFUSION INTRACRANIAL INC DIAG ANGIO  11/16/2021   RADIOLOGY WITH ANESTHESIA N/A 11/16/2021   Procedure: IR WITH ANESTHESIA- CODE STROKE;  Surgeon: Radiologist, Medication, MD;  Location: [place];  Service: Radiology;  Laterality: N/A;   REVERSE SHOULDER ARTHROPLASTY Right 06/10/2021   Procedure: REVERSE SHOULDER ARTHROPLASTY;  Surgeon: Corky Mull, MD;  Location: [facility];  Service: Orthopedics;  Laterality: Right;   Patient Active Problem List   Diagnosis Date Noted   Benign hypertension  with CKD (chronic kidney disease) stage III ([place]) 03/02/2022   CAD (coronary artery disease) 03/02/2022   Hypercholesteremia 03/02/2022   Right hemiparesis ([place]) 12/16/2021   Acute ischemic left MCA stroke ([place]) 11/16/2021   Middle cerebral artery embolism, 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   Status post reverse arthroplasty of shoulder, right 06/10/2021   Traumatic complete tear of right rotator cuff 02/26/2021   Major depression in remission ([place]) 06/25/2020   Dysarthria 09/20/2018   Gastroesophageal reflux disease without esophagitis 09/20/2018   Lumbar radiculopathy 09/20/2018   Healthcare maintenance 05/02/2018   Controlled type 2 diabetes mellitus with stage 3 chronic kidney disease, without long-term current use of insulin ([place])  01/30/2018   Chronic bilateral low back pain with left-sided sciatica 12/07/2016   Hepatic steatosis 11/23/2015   Carotid artery disease ([place]) 01/06/2010    ONSET DATE: 11/16/21   REFERRING DIAG: L MCA CVA   HPI: Pt is a 81 y.o. 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 Patient is s/p thrombectomy with left ICA stent placement 11/16/21. She completed inpatient rehabilitation at [facility] 11/25/21-12/07/21 and was discharged home with family. Patient known to this clinic from prior course of voice therapy in 2018. Pt with documented hx of voice and speech changes (dysarthria) per videostroboscopy 12/26/17; did not attend therapy.   THERAPY DIAG:  Aphasia  Dysarthria and anarthria  Rationale for Evaluation and Treatment Rehabilitation  SUBJECTIVE: Greeted SLP with louder voice today  Pt accompanied by: Ernestine  PAIN: No Are you having pain? No     OBJECTIVE:   TODAY'S TREATMENT: Pt used intelligibility compensations in structured tasks, averaging 71 dB with occasional min-mod cues for sentence level question and answer. Required usual mod cues for carryover into longer conversational responses. Pt used anomia compensations in conversation with occasional encouragement, extended time. SLP requested additional information x4 for clarification of pt's message.   PATIENT EDUCATION: Education details: maintaining adequate hydration to support vocal hygiene Person educated: Patient Education method: Explanation, Demonstration, and Verbal cues Education comprehension: verbalized understanding and needs further education    SLP Short Term Goals - 02/24/22 1334       SLP SHORT TERM GOAL #1   Title Pt will communicate emergency information 100% accuracy using visual aid if necessary.    Baseline 100% 01/24/22    Time 6    Period Weeks    Status Achieved    Target Date 01/27/22      SLP SHORT TERM GOAL #2   Title Pt will generate at least 4 descriptors of  target word 80% of the time using semantic feature analysis to improve abilities in wordfinding and resolving communication breakdowns.    Baseline 85% 01/24/22    Time 6    Period Weeks    Status Achieved   Target Date 01/27/22      SLP SHORT TERM GOAL #3   Title Patient will request repetition/rephrasing to aid auditory comprehension of mod complex information (multi-step commands, conversation) in 80% of opportunities.    Baseline 85% 01/24/22    Time 6    Period Weeks    Status Achieved    Target Date 01/27/22      SLP SHORT TERM GOAL #4   Title Pt will read and reply to simple texts/emails 80% accuracy with use of accessibility features as necessary.    Baseline 01/24/22, extended time, requires occasional cues, continues to require cuing with accessibility features    Time 6    Period Weeks  Status Partially Met    Target Date 01/27/22      SLP SHORT TERM GOAL #5   Title Pt will write sentences of 5-7 words 90% accuracy (self correction allowed) with occasional min cues.    Time 10    Period --   sessions   Status New      Additional Short Term Goals   Additional Short Term Goals Yes      SLP SHORT TERM GOAL #6   Title Pt will use intelligibility strategies (loudness, breath support, overarticulation) for sentence level responses 90% accuracy with min cues.    Time 10    Period --   sessions   Status New      SLP SHORT TERM GOAL #7   Title Pt will use aphasia compensations in 10 minutes moderately complex conversation with fewer than 3 listener questions necessary for clarification of message.    Time 10    Period --   sessions   Status New              SLP Long Term Goals - 02/24/22 1348       SLP LONG TERM GOAL #1   Title Pt will engage in 5-8 minutes simple-mod complex conversation re: topic of interest with supported conversation, aphasia compensations.    Time 12    Period Weeks    Status Achieved      SLP LONG TERM GOAL #2   Title Pt will ID and  attempt repair of communication breakdowns >80% of the time in session.    Time 12    Period Weeks    Status Achieved      SLP LONG TERM GOAL #3   Title Pt will demonstrate reading comprehension of mod complex short story or articles of interest with technology supports (text-to-speech, ereader, etc) by generating summary or answering questions >80% accuracy.    Baseline 02/23/22: Reads/comprehends simple paragraphs unassisted.    Time 12    Period Weeks    Status Revised   and renewed for 12 weeks   Target Date 03/08/22      SLP LONG TERM GOAL #4   Title Pt will self-advocate for communication needs in community or family outings (per her report) to increase confidence in social interactions.    Time 12    Period Weeks    Status New    Target Date 03/08/22      SLP LONG TERM GOAL #5   Title Pt will report reduced frustration with communication breakdowns by implementing aphasia compensation outside [facility] room.    Baseline 02/24/22: frustrated 3 times per day on average    Time 12    Period Weeks    Status New    Target Date 03/08/22      Additional Long Term Goals   Additional Long Term Goals Yes      SLP LONG TERM GOAL #6   Title Pt will use intelligibility strategies (loudness, breath support, overarticulation) 80% accuracy in 10 minutes conversation with modified independence.    Time 12    Period Weeks    Status New    Target Date 05/25/22              Plan    Clinical Impression Statement Pt's aphasia appearing more mild-moderate in recent sessions, and dysarthria/vocal quality/intelligibility significantly improved with use of compensations, for which pt requires min-moderate cues in spontaneous conversation. Pt is progressing toward goals and benefits from continued skilled ST intervention  per plan of care.   Speech Therapy Frequency 2x / week    Duration 2 weeks    Treatment/Interventions SLP instruction and feedback;Multimodal communcation approach;Patient/family  education;Functional tasks;Cueing hierarchy;Compensatory techniques;Compensatory strategies    Potential to Achieve Goals Good    Potential Considerations Family/community support    SLP Home Exercise Plan Talk[facility] Therapy    Consulted and Agree with Plan of Care Patient              Deneise Lever, MS, CCC-SLP Speech-Language Pathologist [phone]  Aliene Altes, [place] 03/10/2022, 2:08 PM

## 2022-03-14 ENCOUNTER — Encounter: Payer: Medicare Other | Admitting: Occupational Therapy

## 2022-03-14 ENCOUNTER — Ambulatory Visit: Payer: Medicare Other

## 2022-03-14 ENCOUNTER — Ambulatory Visit: Payer: Medicare Other | Admitting: Speech Pathology

## 2022-03-14 DIAGNOSIS — R471 Dysarthria and anarthria: Secondary | ICD-10-CM

## 2022-03-14 DIAGNOSIS — R262 Difficulty in walking, not elsewhere classified: Secondary | ICD-10-CM

## 2022-03-14 DIAGNOSIS — M6281 Muscle weakness (generalized): Secondary | ICD-10-CM

## 2022-03-14 DIAGNOSIS — R4701 Aphasia: Secondary | ICD-10-CM | POA: Diagnosis not present

## 2022-03-14 DIAGNOSIS — R2681 Unsteadiness on feet: Secondary | ICD-10-CM

## 2022-03-14 NOTE — Therapy (Signed)
OUTPATIENT PHYSICAL THERAPY TREATMENT NOTE/RECERT  Patient Name: Cassidy Hernandez MRN: [id number] DOB:[birth year], 81 y.o., female Today's Date: 03/14/2022  PCP: Kirk Ruths, MD REFERRING PROVIDER: Rayann Heman, NP   PT End of Session - 03/14/22 1229     Visit Number 21    Number of Visits 37    Date for PT Re-Evaluation 03/15/22    Authorization Type BCBS Medicare    Authorization Time Period 12/21/21-03/15/22    Progress Note Due on Visit 10    PT Start Time 1300    PT Stop Time 1344    PT Time Calculation (min) 44 min    Equipment Utilized During Treatment Gait belt    Activity Tolerance Patient tolerated treatment well;No increased pain    Behavior During Therapy [facility] for tasks assessed/performed                 Past Medical History:  Diagnosis Date   Anginal pain ([place])    Aortic atherosclerosis    Cancer ([place])    Carotid artery stenosis 02/01/2010   a.) Doppler 69/67/8938: 10% LICA and 17% RICA. b.) Doppler 51/09/5850: >77% LICA and 82% RICA. c.) Doppler 10/25/16: >70% Bilater ICAs   Chronic bilateral low back pain with left-sided sciatica    CKD (chronic kidney disease), stage III (HCC)    Coronary artery disease    a.) LHC 12/24/2014 -> small LAD system; diffuse CTO of LAD. 60% RCA. LM and LCx with no sig disease. no intervention; med mgmt.   Degenerative arthritis    Diastolic dysfunction    a.) TTE 09/20/2018: EF 55%, G1DD, mild LAE and RVE, triv-mild panvalvular regurgitation.   Dysarthria    GERD (gastroesophageal reflux disease)    Hepatic steatosis    History of kidney stones    HLD (hyperlipidemia)    Hypertension    Kidney stone    Lumbar radiculopathy    Major depression in remission ([place])    PVD (peripheral vascular disease) ([place])    Renal cyst, right 06/02/2015   a.) CT 06/02/2015 --> 4.4 cm RIGHT renal mass; MRI recommended. b.) MRI 06/16/2015 --> 3.8 x 4.2 x 4.0 cm proteinaceous/hemmorhagic cyst.   Subclavian steal syndrome    a.) LHC  12/24/2014 --> tight eccentric 90% ostial stenosis with damping.   T2DM (type 2 diabetes mellitus) [facility])    Past Surgical History:  Procedure Laterality Date   ABDOMINAL HYSTERECTOMY     AUGMENTATION MAMMAPLASTY Bilateral 1980   BACK SURGERY  09/2018   BICEPT TENODESIS  06/10/2021   Procedure: BICEPS TENODESIS;  Surgeon: Corky Mull, MD;  Location: [facility];  Service: Orthopedics;;   CARDIAC CATHETERIZATION Left 12/24/2014   Procedure: LEFT HEARD CATHETERIZATION; Location: [facility]; Surgeon: Laurence Aly, MD   CHOLECYSTECTOMY     COLONOSCOPY N/A 02/11/2016   Procedure: COLONOSCOPY;  Surgeon: Manya Silvas, MD;  Location: [facility] ENDOSCOPY;  Service: Endoscopy;  Laterality: N/A;   IR CT HEAD LTD  11/16/2021   IR CT HEAD LTD  11/16/2021   IR INTRAVSC STENT CERV CAROTID W/O EMB-PROT MOD SED INC ANGIO  11/18/2021   IR PERCUTANEOUS ART THROMBECTOMY/INFUSION INTRACRANIAL INC DIAG ANGIO  11/16/2021   RADIOLOGY WITH ANESTHESIA N/A 11/16/2021   Procedure: IR WITH ANESTHESIA- CODE STROKE;  Surgeon: Radiologist, Medication, MD;  Location: ;  Service: Radiology;  Laterality: N/A;   REVERSE SHOULDER ARTHROPLASTY Right 06/10/2021   Procedure: REVERSE SHOULDER ARTHROPLASTY;  Surgeon: Corky Mull, MD;  Location: [facility];  Service:  Orthopedics;  Laterality: Right;   Patient Active Problem List   Diagnosis Date Noted   Benign hypertension with CKD (chronic kidney disease) stage III ([place]) 03/02/2022   CAD (coronary artery disease) 03/02/2022   Hypercholesteremia 03/02/2022   Right hemiparesis ([place]) 12/16/2021   Acute ischemic left MCA stroke ([place]) 11/16/2021   Middle cerebral artery embolism, 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   Status post reverse arthroplasty of shoulder, right 06/10/2021   Traumatic complete tear of right rotator cuff 02/26/2021   Major depression in remission ([place]) 06/25/2020   Dysarthria 09/20/2018   Gastroesophageal reflux disease without esophagitis 09/20/2018   Lumbar radiculopathy 09/20/2018    Healthcare maintenance 05/02/2018   Controlled type 2 diabetes mellitus with stage 3 chronic kidney disease, without long-term current use of insulin ([place]) 01/30/2018   Chronic bilateral low back pain with left-sided sciatica 12/07/2016   Hepatic steatosis 11/23/2015   Carotid artery disease ([place]) 01/06/2010    REFERRING DIAG: H84.696 (ICD-10-CM) - Cerebral infarction due to unspecified occlusion or stenosis of left middle cerebral artery  THERAPY DIAG:  Muscle weakness (generalized)  Unsteadiness on feet  Difficulty in walking, not elsewhere classified  Rationale for Evaluation and Treatment Rehabilitation  PERTINENT HISTORY: Pt Recent L MCA, s/p thrombectomy and ICA stenting. Pt with speech deficits. Pt has been somewhat unsteady with a few falls falling backward however DTR reports this is not new, but has been ongoing. Pt seen at our Sports clinic in 2018 for low back pain issues in the setting of spinal stenosis.  PRECAUTIONS: N/A  SUBJECTIVE:  Patient reports her foot continues to be challenged with flexion. Continues to have pain when walking.      PAIN:  Are you having pain? None at rest, but does feel pain when walking.   TODAY'S TREATMENT:     Therapeutic Exercises:  Nustep 3 ; seat position 5; cues for increased velocity for musculoskeletal challenge x 4 minutes      -STS from standard height chair, no UE support, 2x12. Pt rates medium. Cuing for anterior weight shift. Improves eccentric control following cue.     High knee march to end of // bars, backwards walk back with cues for larger step backwards 4x length of // bars   HEP updated and patient performed.   Neuro Re-ed: Standing throw ball at target 15x on airex pad x 2 trials  Step over orange hurdle and back 10x each LE; finger tip support    PATIENT EDUCATION: Education details: exercise technique, body mechanics, goals, indications for progress/plan Person educated: Patient Education method:  Explanation, Demonstration, Tactile cues, and Verbal cues Education comprehension: verbalized understanding, returned demonstration, verbal cues required, tactile cues required, and needs further education   HOME EXERCISE PROGRAM:  Access Code: [email or web address] URL: [email or web address] Date: 03/14/2022 Prepared by: Janna Arch  Exercises - Seated Ankle Dorsiflexion with Resistance  - 1 x daily - 7 x weekly - 2 sets - 10 reps - 5 hold - Seated Ankle Plantarflexion with Resistance  - 1 x daily - 7 x weekly - 2 sets - 10 reps - 5 hold - Seated Ankle Inversion with Anchored Resistance  - 1 x daily - 7 x weekly - 2 sets - 10 reps - 5 hold - Seated Ankle Eversion with Resistance  - 1 x daily - 7 x weekly - 2 sets - 10 reps - 5 hold - Seated Toe Towel Scrunches  - 1 x daily - 7 x weekly - 2 sets - 10 reps - 5  hold - Seated Marble Pick-Up with Toes  - 1 x daily - 7 x weekly - 2 sets - 10 reps - 5 hold - Sit to Stand  - 1 x daily - 7 x weekly - 2 sets - 10 reps - 5 hold - Standing March with Counter Support  - 1 x daily - 7 x weekly - 2 sets - 10 reps - 5 hold - Standing Hip Extension with Counter Support  - 1 x daily - 7 x weekly - 2 sets - 10 reps - 5 hold - Leg Extension  - 1 x daily - 7 x weekly - 2 sets - 10 reps - 5 hold Access Code: [email or web address]    Access Code: [email or web address] URL: [email or web address] Date: 03/09/2022 Prepared by: Janna Arch  Exercises - Seated Ankle Dorsiflexion with Resistance  - 1 x daily - 7 x weekly - 2 sets - 10 reps - 5 hold - Seated Ankle Plantarflexion with Resistance  - 1 x daily - 7 x weekly - 2 sets - 10 reps - 5 hold - Seated Ankle Inversion with Anchored Resistance  - 1 x daily - 7 x weekly - 2 sets - 10 reps - 5 hold - Seated Ankle Eversion with Resistance  - 1 x daily - 7 x weekly - 2 sets - 10 reps - 5 hold - Seated Toe Towel Scrunches  - 1 x daily - 7 x weekly - 2 sets - 10 reps - 5 hold - Seated Marble Pick-Up with Toes  - 1 x daily  - 7 x weekly - 2 sets - 10 reps - 5 hold   PT Short Term Goals       PT SHORT TERM GOAL #1   Title Independence in HEP for improved balance and strength    Baseline Issued at eval  8/3: Pt reports she is performing her HEP every other day, not performing all her exercises   Time 3    Period Weeks    Status On-going    Target Date 04/11/2022       PT SHORT TERM GOAL #2   Title 5xSTS hands free <19sec in order to indicate decreased fall risk   Baseline Eval: >24sec hands free; 8/3:  32 sec hands free, CGA   Time 4    Period Weeks    Status Revised    Target Date 04/11/2022       PT SHORT TERM GOAL #3   Title The pt will demonstrate Ankle DF MMT 5/5 bilat for improved gait mechanics and functional mobility   Baseline Eval: 4+/5 Right, 4/5 Left; 8/3: 4+/5 B   Time 4    Period Weeks    Status Revised     Target Date 04/11/2022               PT Long Term Goals       PT LONG TERM GOAL #1   Title Pt will ambulate at least 1000 ft on the 6MWT for progression to community ambulator and improve gait ability   Baseline July 2019: 135f; 03/10/22: 580 ft with 4WW   Time 8    Period Weeks    Status Revised    Target Date 05/09/2022       PT LONG TERM GOAL #2   Title Pt will complete 5xSTS <13 sec hands free to indicate decreased fall risk and increased BLE power.   Baseline eval: >24sec hands free; 8/3: 32 sec hands free, CGA  Time 8    Period Weeks    Status Revised    Target Date 05/09/2022       PT LONG TERM GOAL #3   Title Pt to demonstrate retroAMB without device >0.39ms s LOB to demonstrate improved ankle righting and control.    Baseline Eval: <0.039m and c 3 LOB 03/10/22: .08 m/s with 4WW    Time 10    Period Weeks    Status Partially Met   Target Date 06/06/2022      PT LONG TERM GOAL #4   Title The pt FOTO Score will increase by >15 points to indicate improved functional mobility and QOL    Baseline eval: 48; 03/10/2022: 61% (previously 62% on 02/02/22)     Time 12    Period Weeks    Status Achieved (revised to indicate functional purpose of goal)    Target Date              Plan     Clinical Impression Statement Patient's goals performed last session, please refer to this note for further details. Her ambulation and transfers  continues to be limited by R foot pain impacting her goal performance.  Patient educated on need for compliance with HEP for progression of care and to retain her on caseload. New HEP printed with patient and caregiver demonstrating understanding.  The pt will continue to benefit from skilled physical therapy intervention to address impairments, improve QOL, and attain therapy goals.        Personal Factors and Comorbidities Past/Current Experience    Examination-Activity Limitations Dressing;Transfers;Bend;Locomotion Level;Stairs;Stand    Examination-Participation Restrictions Cleaning;Shop;Meal Prep;[place];Laundry    Stability/Clinical Decision Making Evolving/Moderate complexity    Rehab Potential Good    Clinical Impairments Affecting Rehab Potential (+) motivation, social support (-) age, chronicity of pain, fairly sedentary lifestyle    PT Frequency 2x / week    PT Duration 12 weeks    PT Treatment/Interventions Aquatic Therapy;Moist Heat;Functional mobility training;Dry needling;Neuromuscular re-education;Traction;Ultrasound;Iontophoresis 72m12ml Dexamethasone;ADLs/Self Care Home Management;Cryotherapy;Electrical Stimulation;Therapeutic exercise;Therapeutic activities;Patient/family education;Taping;Gait training;Stair training;DME Instruction;Balance training;Passive range of motion    PT Next Visit Plan Progress safety with mobility and LE strengthening as appropriate.    PT Home Exercise Plan Eval: seated heel raises, toe raises, and STS from elevated surface. 01/04/2022=Access Code: [email or web address]: [email or web address]    Consulted and Agree with Plan of Care Patient;Family member/caregiver     Family Member Consulted EarEino Farber  03/14/22

## 2022-03-15 NOTE — Therapy (Signed)
OUTPATIENT SPEECH LANGUAGE PATHOLOGY TREATMENT NOTE   Patient Name: Cassidy Hernandez MRN: [id number] DOB:September 02, 1940, 81 y.o., female Today's Date: 03/15/2022   REFERRING PROVIDER: Merrilee Seashore, NP     End of Session - 03/15/22 1752     Visit Number 25    Number of Visits 25    Date for SLP Re-Evaluation 03/16/22    Authorization Type BCBS MCR; $10 copay    Authorization - Visit Number 5    Progress Note Due on Visit 10    SLP Start Time 1130    SLP Stop Time  1230    SLP Time Calculation (min) 60 min    Activity Tolerance Patient tolerated treatment well                 Past Medical History:  Diagnosis Date   Anginal pain ([place])    Aortic atherosclerosis    Cancer ([place])    Carotid artery stenosis 02/01/2010   a.) Doppler 29/52/8413: 24% LICA and 40% RICA. b.) Doppler 06/04/2535: >64% LICA and 40% RICA. c.) Doppler 10/25/16: >70% Bilater ICAs   Chronic bilateral low back pain with left-sided sciatica    CKD (chronic kidney disease), stage III (HCC)    Coronary artery disease    a.) LHC 12/24/2014 -> small LAD system; diffuse CTO of LAD. 60% RCA. LM and LCx with no sig disease. no intervention; med mgmt.   Degenerative arthritis    Diastolic dysfunction    a.) TTE 09/20/2018: EF 55%, G1DD, mild LAE and RVE, triv-mild panvalvular regurgitation.   Dysarthria    GERD (gastroesophageal reflux disease)    Hepatic steatosis    History of kidney stones    HLD (hyperlipidemia)    Hypertension    Kidney stone    Lumbar radiculopathy    Major depression in remission ([place])    PVD (peripheral vascular disease) ([place])    Renal cyst, right 06/02/2015   a.) CT 06/02/2015 --> 4.4 cm RIGHT renal mass; MRI recommended. b.) MRI 06/16/2015 --> 3.8 x 4.2 x 4.0 cm proteinaceous/hemmorhagic cyst.   Subclavian steal syndrome    a.) LHC 12/24/2014 --> tight eccentric 90% ostial stenosis with damping.   T2DM (type 2 diabetes mellitus) [facility])    Past Surgical History:  Procedure Laterality  Date   ABDOMINAL HYSTERECTOMY     AUGMENTATION MAMMAPLASTY Bilateral 1980   BACK SURGERY  09/2018   BICEPT TENODESIS  06/10/2021   Procedure: BICEPS TENODESIS;  Surgeon: Corky Mull, MD;  Location: [facility];  Service: Orthopedics;;   CARDIAC CATHETERIZATION Left 12/24/2014   Procedure: LEFT HEARD CATHETERIZATION; Location: [facility]; Surgeon: Laurence Aly, MD   CHOLECYSTECTOMY     COLONOSCOPY N/A 02/11/2016   Procedure: COLONOSCOPY;  Surgeon: Manya Silvas, MD;  Location: [facility] ENDOSCOPY;  Service: Endoscopy;  Laterality: N/A;   IR CT HEAD LTD  11/16/2021   IR CT HEAD LTD  11/16/2021   IR INTRAVSC STENT CERV CAROTID W/O EMB-PROT MOD SED INC ANGIO  11/18/2021   IR PERCUTANEOUS ART THROMBECTOMY/INFUSION INTRACRANIAL INC DIAG ANGIO  11/16/2021   RADIOLOGY WITH ANESTHESIA N/A 11/16/2021   Procedure: IR WITH ANESTHESIA- CODE STROKE;  Surgeon: Radiologist, Medication, MD;  Location: [facility];  Service: Radiology;  Laterality: N/A;   REVERSE SHOULDER ARTHROPLASTY Right 06/10/2021   Procedure: REVERSE SHOULDER ARTHROPLASTY;  Surgeon: Corky Mull, MD;  Location: [facility];  Service: Orthopedics;  Laterality: Right;   Patient Active Problem List   Diagnosis Date Noted   Benign hypertension  with CKD (chronic kidney disease) stage III ([place]) 03/02/2022   CAD (coronary artery disease) 03/02/2022   Hypercholesteremia 03/02/2022   Right hemiparesis ([place]) 12/16/2021   Acute ischemic left MCA stroke ([place]) 11/16/2021   Middle cerebral artery embolism, 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   Status post reverse arthroplasty of shoulder, right 06/10/2021   Traumatic complete tear of right rotator cuff 02/26/2021   Major depression in remission ([place]) 06/25/2020   Dysarthria 09/20/2018   Gastroesophageal reflux disease without esophagitis 09/20/2018   Lumbar radiculopathy 09/20/2018   Healthcare maintenance 05/02/2018   Controlled type 2 diabetes mellitus with stage 3 chronic kidney disease, without long-term current use of insulin ([place])  01/30/2018   Chronic bilateral low back pain with left-sided sciatica 12/07/2016   Hepatic steatosis 11/23/2015   Carotid artery disease ([place]) 01/06/2010    ONSET DATE: 11/16/21   REFERRING DIAG: L MCA CVA   HPI: Pt is a 81 y.o. 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 Patient is s/p thrombectomy with left ICA stent placement 11/16/21. She completed inpatient rehabilitation at [facility] 11/25/21-12/07/21 and was discharged home with family. Patient known to this clinic from prior course of voice therapy in 2018. Pt with documented hx of voice and speech changes (dysarthria) per videostroboscopy 12/26/17; did not attend therapy.   THERAPY DIAG:  Aphasia  Dysarthria and anarthria  Rationale for Evaluation and Treatment Rehabilitation  SUBJECTIVE: "What are we doing?"  Pt accompanied by: sister Rod Holler  PAIN: No Are you having pain? No     OBJECTIVE:   TODAY'S TREATMENT: Targeted intelligibility strategies in sentence level task, average 70 dB, occasional min cues necessary for louder speech or overarticulation (90% acc for strategy use). Progressed to paragraph level reading task, intelligibility 100%. Increased task complexity to incorporate pt's short term goals for written expression and aphasia compensations by facilitating mod complex conversation re: article topic and having pt generate written sentence responses to questions on the article. Pt re-read responses and self corrected several errors, overall accuracy 85% for sentences of 5-7 words with min-mod cues.   PATIENT EDUCATION: Education details: maintaining adequate hydration to support vocal hygiene Person educated: Patient Education method: Explanation, Demonstration, and Verbal cues Education comprehension: verbalized understanding and needs further education    SLP Short Term Goals - 02/24/22 1334       SLP SHORT TERM GOAL #1   Title Pt will communicate emergency information 100% accuracy  using visual aid if necessary.    Baseline 100% 01/24/22    Time 6    Period Weeks    Status Achieved    Target Date 01/27/22      SLP SHORT TERM GOAL #2   Title Pt will generate at least 4 descriptors of target word 80% of the time using semantic feature analysis to improve abilities in wordfinding and resolving communication breakdowns.    Baseline 85% 01/24/22    Time 6    Period Weeks    Status Achieved   Target Date 01/27/22      SLP SHORT TERM GOAL #3   Title Patient will request repetition/rephrasing to aid auditory comprehension of mod complex information (multi-step commands, conversation) in 80% of opportunities.    Baseline 85% 01/24/22    Time 6    Period Weeks    Status Achieved    Target Date 01/27/22      SLP SHORT TERM GOAL #4   Title Pt will read and reply to simple texts/emails 80% accuracy  with use of accessibility features as necessary.    Baseline 01/24/22, extended time, requires occasional cues, continues to require cuing with accessibility features    Time 6    Period Weeks    Status Partially Met    Target Date 01/27/22      SLP SHORT TERM GOAL #5   Title Pt will write sentences of 5-7 words 90% accuracy (self correction allowed) with occasional min cues.    Time 10    Period --   sessions   Status New      Additional Short Term Goals   Additional Short Term Goals Yes      SLP SHORT TERM GOAL #6   Title Pt will use intelligibility strategies (loudness, breath support, overarticulation) for sentence level responses 90% accuracy with min cues.    Time 10    Period --   sessions   Status New      SLP SHORT TERM GOAL #7   Title Pt will use aphasia compensations in 10 minutes moderately complex conversation with fewer than 3 listener questions necessary for clarification of message.    Time 10    Period --   sessions   Status New              SLP Long Term Goals - 02/24/22 1348       SLP LONG TERM GOAL #1   Title Pt will engage in 5-8  minutes simple-mod complex conversation re: topic of interest with supported conversation, aphasia compensations.    Time 12    Period Weeks    Status Achieved      SLP LONG TERM GOAL #2   Title Pt will ID and attempt repair of communication breakdowns >80% of the time in session.    Time 12    Period Weeks    Status Achieved      SLP LONG TERM GOAL #3   Title Pt will demonstrate reading comprehension of mod complex short story or articles of interest with technology supports (text-to-speech, ereader, etc) by generating summary or answering questions >80% accuracy.    Baseline 02/23/22: Reads/comprehends simple paragraphs unassisted.    Time 12    Period Weeks    Status Revised   and renewed for 12 weeks   Target Date 03/08/22      SLP LONG TERM GOAL #4   Title Pt will self-advocate for communication needs in community or family outings (per her report) to increase confidence in social interactions.    Time 12    Period Weeks    Status New    Target Date 03/08/22      SLP LONG TERM GOAL #5   Title Pt will report reduced frustration with communication breakdowns by implementing aphasia compensation outside [place] room.    Baseline 02/24/22: frustrated 3 times per day on average    Time 12    Period Weeks    Status New    Target Date 03/08/22      Additional Long Term Goals   Additional Long Term Goals Yes      SLP LONG TERM GOAL #6   Title Pt will use intelligibility strategies (loudness, breath support, overarticulation) 80% accuracy in 10 minutes conversation with modified independence.    Time 12    Period Weeks    Status New    Target Date 05/25/22              Plan    Clinical Impression Statement Pt's  aphasia appearing more mild-moderate in recent sessions, and dysarthria/vocal quality/intelligibility significantly improved with use of compensations, for which pt requires min-moderate cues in spontaneous conversation. Sentences of 5-7 words accuracy 85% today with  min-mod cues. Pt is progressing toward goals and benefits from continued skilled ST intervention per plan of care.   Speech Therapy Frequency 2x / week    Duration 2 weeks    Treatment/Interventions SLP instruction and feedback;Multimodal communcation approach;Patient/family education;Functional tasks;Cueing hierarchy;Compensatory techniques;Compensatory strategies    Potential to Achieve Goals Good    Potential Considerations Family/community support    SLP Home Exercise Plan [facility] Therapy    Consulted and Agree with Plan of Care Patient              Deneise Lever, MS, CCC-SLP Speech-Language Pathologist ([place], Mark 03/15/2022, 5:53 PM

## 2022-03-16 ENCOUNTER — Ambulatory Visit: Payer: Medicare Other

## 2022-03-16 ENCOUNTER — Ambulatory Visit: Payer: Medicare Other | Admitting: Speech Pathology

## 2022-03-16 ENCOUNTER — Encounter: Payer: Medicare Other | Admitting: Occupational Therapy

## 2022-03-16 DIAGNOSIS — R269 Unspecified abnormalities of gait and mobility: Secondary | ICD-10-CM

## 2022-03-16 DIAGNOSIS — M6281 Muscle weakness (generalized): Secondary | ICD-10-CM

## 2022-03-16 DIAGNOSIS — R262 Difficulty in walking, not elsewhere classified: Secondary | ICD-10-CM

## 2022-03-16 DIAGNOSIS — R4701 Aphasia: Secondary | ICD-10-CM | POA: Diagnosis not present

## 2022-03-16 DIAGNOSIS — R2681 Unsteadiness on feet: Secondary | ICD-10-CM

## 2022-03-16 DIAGNOSIS — R471 Dysarthria and anarthria: Secondary | ICD-10-CM

## 2022-03-16 NOTE — Therapy (Signed)
OUTPATIENT SPEECH LANGUAGE PATHOLOGY TREATMENT NOTE AND RECERTIFICATION   Patient Name: Cassidy Hernandez MRN: [id number] DOB:[birth year], 81 y.o., female Today's Date: 03/16/2022   REFERRING PROVIDER: Merrilee Seashore, NP     End of Session - 03/16/22 1407     Visit Number 26    Number of Visits 54    Date for SLP Re-Evaluation 06/14/22    Authorization Type BCBS MCR; $10 copay    Authorization - Visit Number 6    Progress Note Due on Visit 10    SLP Start Time 1400    SLP Stop Time  1500    SLP Time Calculation (min) 60 min    Activity Tolerance Patient tolerated treatment well                 Past Medical History:  Diagnosis Date   Anginal pain ([place])    Aortic atherosclerosis    Cancer ([place])    Carotid artery stenosis 02/01/2010   a.) Doppler 80/99/8338: 25% LICA and 05% RICA. b.) Doppler 39/76/7341: >93% LICA and 79% RICA. c.) Doppler 10/25/16: >70% Bilater ICAs   Chronic bilateral low back pain with left-sided sciatica    CKD (chronic kidney disease), stage III (HCC)    Coronary artery disease    a.) LHC 12/24/2014 -> small LAD system; diffuse CTO of LAD. 60% RCA. LM and LCx with no sig disease. no intervention; med mgmt.   Degenerative arthritis    Diastolic dysfunction    a.) TTE 09/20/2018: EF 55%, G1DD, mild LAE and RVE, triv-mild panvalvular regurgitation.   Dysarthria    GERD (gastroesophageal reflux disease)    Hepatic steatosis    History of kidney stones    HLD (hyperlipidemia)    Hypertension    Kidney stone    Lumbar radiculopathy    Major depression in remission ([place])    PVD (peripheral vascular disease) ([place])    Renal cyst, right 06/02/2015   a.) CT 06/02/2015 --> 4.4 cm RIGHT renal mass; MRI recommended. b.) MRI 06/16/2015 --> 3.8 x 4.2 x 4.0 cm proteinaceous/hemmorhagic cyst.   Subclavian steal syndrome    a.) LHC 12/24/2014 --> tight eccentric 90% ostial stenosis with damping.   T2DM (type 2 diabetes mellitus) [facility])    Past Surgical History:   Procedure Laterality Date   ABDOMINAL HYSTERECTOMY     AUGMENTATION MAMMAPLASTY Bilateral 1980   BACK SURGERY  09/2018   BICEPT TENODESIS  06/10/2021   Procedure: BICEPS TENODESIS;  Surgeon: Corky Mull, MD;  Location: [facility];  Service: Orthopedics;;   CARDIAC CATHETERIZATION Left 12/24/2014   Procedure: LEFT HEARD CATHETERIZATION; Location: [facility]; Surgeon: Laurence Aly, MD   CHOLECYSTECTOMY     COLONOSCOPY N/A 02/11/2016   Procedure: COLONOSCOPY;  Surgeon: Manya Silvas, MD;  Location: [facility] ENDOSCOPY;  Service: Endoscopy;  Laterality: N/A;   IR CT HEAD LTD  11/16/2021   IR CT HEAD LTD  11/16/2021   IR INTRAVSC STENT CERV CAROTID W/O EMB-PROT MOD SED INC ANGIO  11/18/2021   IR PERCUTANEOUS ART THROMBECTOMY/INFUSION INTRACRANIAL INC DIAG ANGIO  11/16/2021   RADIOLOGY WITH ANESTHESIA N/A 11/16/2021   Procedure: IR WITH ANESTHESIA- CODE STROKE;  Surgeon: Radiologist, Medication, MD;  Location: [place];  Service: Radiology;  Laterality: N/A;   REVERSE SHOULDER ARTHROPLASTY Right 06/10/2021   Procedure: REVERSE SHOULDER ARTHROPLASTY;  Surgeon: Corky Mull, MD;  Location: [facility];  Service: Orthopedics;  Laterality: Right;   Patient Active Problem List   Diagnosis Date Noted  Benign hypertension with CKD (chronic kidney disease) stage III ([place]) 03/02/2022   CAD (coronary artery disease) 03/02/2022   Hypercholesteremia 03/02/2022   Right hemiparesis ([place]) 12/16/2021   Acute ischemic left MCA stroke ([place]) 11/16/2021   Middle cerebral artery embolism, 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   Status post reverse arthroplasty of shoulder, right 06/10/2021   Traumatic complete tear of right rotator cuff 02/26/2021   Major depression in remission ([place]) 06/25/2020   Dysarthria 09/20/2018   Gastroesophageal reflux disease without esophagitis 09/20/2018   Lumbar radiculopathy 09/20/2018   Healthcare maintenance 05/02/2018   Controlled type 2 diabetes mellitus with stage 3 chronic kidney disease, without long-term  current use of insulin ([place]) 01/30/2018   Chronic bilateral low back pain with left-sided sciatica 12/07/2016   Hepatic steatosis 11/23/2015   Carotid artery disease ([place]) 01/06/2010    ONSET DATE: 11/16/21   REFERRING DIAG: L MCA CVA   HPI: Pt is a 81 y.o. 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 Patient is s/p thrombectomy with left ICA stent placement 11/16/21. She completed inpatient [facility] at [facility] 11/25/21-12/07/21 and was discharged home with family. Patient known to this clinic from prior course of voice therapy in 2018. Pt with documented hx of voice and speech changes (dysarthria) per videostroboscopy 12/26/17; did not attend therapy.   THERAPY DIAG:  Aphasia  Dysarthria and anarthria  Rationale for Evaluation and Treatment Rehabilitation  SUBJECTIVE: Pt retrieved her homework  Pt accompanied by: sister Rod Holler  PAIN: No Are you having pain? No     OBJECTIVE:   TODAY'S TREATMENT: Continued training with intelligibility strategies in paragraph level task, with pt requiring occasional mod A for slow rate, overarticulation. Pt benefitted from auditory feedback as well as gestural cues (tapping) for pacing. Pt wrote sentences of 5-10 words regarding article content questions with mildly extended time, 85% acc with occasional min-mod cues for syntax. Targeted anomia and dysarthria compensations in mod complex conversation re: pt's travels, with pt using compensations effectively with extended time aside from one occasion, where she required verbal cues to provide description (vienna sausages). Benefitted from use of visual aid (map) to facilitate wordfinding of countries she had visited.   PATIENT EDUCATION: Education details: read sentences aloud to ID errors Person educated: Patient Education method: Explanation, Demonstration, and Verbal cues Education comprehension: verbalized understanding and needs further education    SLP Short Term  Goals       SLP SHORT TERM GOAL #1   Title Pt will communicate emergency information 100% accuracy using visual aid if necessary.    Baseline 100% 01/24/22    Time 6    Period Weeks    Status Achieved    Target Date 01/27/22      SLP SHORT TERM GOAL #2   Title Pt will generate at least 4 descriptors of target word 80% of the time using semantic feature analysis to improve abilities in wordfinding and resolving communication breakdowns.    Baseline 85% 01/24/22    Time 6    Period Weeks    Status Achieved   Target Date 01/27/22      SLP SHORT TERM GOAL #3   Title Patient will request repetition/rephrasing to aid auditory comprehension of mod complex information (multi-step commands, conversation) in 80% of opportunities.    Baseline 85% 01/24/22    Time 6    Period Weeks    Status Achieved    Target Date 01/27/22      SLP SHORT TERM  GOAL #4   Title Pt will read and reply to simple texts/emails 80% accuracy with use of accessibility features as necessary.    Baseline 01/24/22, extended time, requires occasional cues, continues to require cuing with accessibility features    Time 6    Period Weeks    Status Partially Met    Target Date 01/27/22      SLP SHORT TERM GOAL #5   Title Pt will write sentences of 5-7 words 90% accuracy (self correction allowed) with occasional min cues.    Time 10    Period --   sessions   Status New      Additional Short Term Goals   Additional Short Term Goals Yes      SLP SHORT TERM GOAL #6   Title Pt will use intelligibility strategies (loudness, breath support, overarticulation) for sentence level responses 90% accuracy with min cues.    Time 10    Period --   sessions   Status New      SLP SHORT TERM GOAL #7   Title Pt will use aphasia compensations in 10 minutes moderately complex conversation with fewer than 3 listener questions necessary for clarification of message.    Time 10    Period --   sessions   Status New               SLP Long Term Goals       SLP LONG TERM GOAL #1   Title Pt will engage in 5-8 minutes simple-mod complex conversation re: topic of interest with supported conversation, aphasia compensations.    Time 12    Period Weeks    Status Achieved      SLP LONG TERM GOAL #2   Title Pt will ID and attempt repair of communication breakdowns >80% of the time in session.    Time 12    Period Weeks    Status Achieved      SLP LONG TERM GOAL #3   Title Pt will demonstrate reading comprehension of mod complex short story or articles of interest with technology supports (text-to-speech, ereader, etc) by generating summary or answering questions >80% accuracy.    Baseline 02/23/22: Reads/comprehends simple paragraphs unassisted.    Time 12    Period Weeks    Status Revised   and renewed for 12 weeks   Target Date 03/08/22      SLP LONG TERM GOAL #4   Title Pt will self-advocate for communication needs in community or family outings (per her report) to increase confidence in social interactions.    Time 12    Period Weeks    Status New    Target Date 03/08/22      SLP LONG TERM GOAL #5   Title Pt will report reduced frustration with communication breakdowns by implementing aphasia compensation outside [place] room.    Baseline 02/24/22: frustrated 3 times per day on average    Time 12    Period Weeks    Status New    Target Date 03/08/22      Additional Long Term Goals   Additional Long Term Goals Yes      SLP LONG TERM GOAL #6   Title Pt will use intelligibility strategies (loudness, breath support, overarticulation) 80% accuracy in 10 minutes conversation with modified independence.    Time 12    Period Weeks    Status New    Target Date 05/25/22  Plan    Clinical Impression Statement Pt's aphasia appearing more mild-moderate in recent sessions, and dysarthria/vocal quality/intelligibility significantly improved with use of compensations, for which pt requires min-moderate  cues in spontaneous conversation. Sentences of 5-10 words accuracy 85% today with min-mod cues. Pt is progressing toward goals and benefits from continued skilled ST intervention per plan of care. Recertification completed today; goals as stated above remain appropriate.    Speech Therapy Frequency 2x / week    Duration 2 weeks    Treatment/Interventions SLP instruction and feedback;Multimodal communcation approach;Patient/family education;Functional tasks;Cueing hierarchy;Compensatory techniques;Compensatory strategies    Potential to Achieve Goals Good    Potential Considerations Family/community support    SLP Home Exercise Plan TalkPath Therapy    Consulted and Agree with Plan of Care Patient              Deneise Lever, MS, CCC-SLP Speech-Language Pathologist [phone]  Aliene Altes, [place] 03/16/2022, 2:09 PM

## 2022-03-16 NOTE — Therapy (Signed)
OUTPATIENT PHYSICAL THERAPY TREATMENT NOTE/RECERT  Patient Name: Cassidy Hernandez MRN: [id number] DOB:Jun 24, 1941, 81 y.o., female Today's Date: 03/16/2022  PCP: Kirk Ruths, MD REFERRING PROVIDER: Rayann Heman, NP   PT End of Session - 03/16/22 1524     Visit Number 22    Number of Visits 37    Date for PT Re-Evaluation 03/15/22    Authorization Type BCBS Medicare    Authorization Time Period 12/21/21-03/15/22    PT Start Time 1518    PT Stop Time 1556    PT Time Calculation (min) 38 min    Equipment Utilized During Treatment Gait belt    Activity Tolerance Patient tolerated treatment well    Behavior During Therapy [facility] for tasks assessed/performed                 Past Medical History:  Diagnosis Date   Anginal pain ([place])    Aortic atherosclerosis    Cancer ([place])    Carotid artery stenosis 02/01/2010   a.) Doppler 81/82/9937: 16% LICA and 96% RICA. b.) Doppler 78/93/8101: >75% LICA and 10% RICA. c.) Doppler 10/25/16: >70% Bilater ICAs   Chronic bilateral low back pain with left-sided sciatica    CKD (chronic kidney disease), stage III ([place])    Coronary artery disease    a.) LHC 12/24/2014 -> small LAD system; diffuse CTO of LAD. 60% RCA. LM and LCx with no sig disease. no intervention; med mgmt.   Degenerative arthritis    Diastolic dysfunction    a.) TTE 09/20/2018: EF 55%, G1DD, mild LAE and RVE, triv-mild panvalvular regurgitation.   Dysarthria    GERD (gastroesophageal reflux disease)    Hepatic steatosis    History of kidney stones    HLD (hyperlipidemia)    Hypertension    Kidney stone    Lumbar radiculopathy    Major depression in remission ([place])    PVD (peripheral vascular disease) ([place])    Renal cyst, right 06/02/2015   a.) CT 06/02/2015 --> 4.4 cm RIGHT renal mass; MRI recommended. b.) MRI 06/16/2015 --> 3.8 x 4.2 x 4.0 cm proteinaceous/hemmorhagic cyst.   Subclavian steal syndrome    a.) LHC 12/24/2014 --> tight eccentric 90% ostial stenosis  with damping.   T2DM (type 2 diabetes mellitus) [facility])    Past Surgical History:  Procedure Laterality Date   ABDOMINAL HYSTERECTOMY     AUGMENTATION MAMMAPLASTY Bilateral 1980   BACK SURGERY  09/2018   BICEPT TENODESIS  06/10/2021   Procedure: BICEPS TENODESIS;  Surgeon: Corky Mull, MD;  Location: [facility];  Service: Orthopedics;;   CARDIAC CATHETERIZATION Left 12/24/2014   Procedure: LEFT HEARD CATHETERIZATION; Location: [facility]; Surgeon: Laurence Aly, MD   CHOLECYSTECTOMY     COLONOSCOPY N/A 02/11/2016   Procedure: COLONOSCOPY;  Surgeon: Manya Silvas, MD;  Location: [facility]COPY;  Service: Endoscopy;  Laterality: N/A;   IR CT HEAD LTD  11/16/2021   IR CT HEAD LTD  11/16/2021   IR INTRAVSC STENT CERV CAROTID W/O EMB-PROT MOD SED INC ANGIO  11/18/2021   IR PERCUTANEOUS ART THROMBECTOMY/INFUSION INTRACRANIAL INC DIAG ANGIO  11/16/2021   RADIOLOGY WITH ANESTHESIA N/A 11/16/2021   Procedure: IR WITH ANESTHESIA- CODE STROKE;  Surgeon: Radiologist, Medication, MD;  Location: [place];  Service: Radiology;  Laterality: N/A;   REVERSE SHOULDER ARTHROPLASTY Right 06/10/2021   Procedure: REVERSE SHOULDER ARTHROPLASTY;  Surgeon: Corky Mull, MD;  Location: [facility];  Service: Orthopedics;  Laterality: Right;   Patient Active Problem List  Diagnosis Date Noted   Benign hypertension with CKD (chronic kidney disease) stage III ([place]) 03/02/2022   CAD (coronary artery disease) 03/02/2022   Hypercholesteremia 03/02/2022   Right hemiparesis ([place]) 12/16/2021   Acute ischemic left MCA stroke ([place]) 11/16/2021   Middle cerebral artery embolism, 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   Status post reverse arthroplasty of shoulder, right 06/10/2021   Traumatic complete tear of right rotator cuff 02/26/2021   Major depression in remission ([place]) 06/25/2020   Dysarthria 09/20/2018   Gastroesophageal reflux disease without esophagitis 09/20/2018   Lumbar radiculopathy 09/20/2018   Healthcare maintenance 05/02/2018   Controlled  type 2 diabetes mellitus with stage 3 chronic kidney disease, without long-term current use of insulin ([place]) 01/30/2018   Chronic bilateral low back pain with left-sided sciatica 12/07/2016   Hepatic steatosis 11/23/2015   Carotid artery disease ([place]) 01/06/2010    REFERRING DIAG: C78.938 (ICD-10-CM) - Cerebral infarction due to unspecified occlusion or stenosis of left middle cerebral artery  THERAPY DIAG:  Muscle weakness (generalized)  Unsteadiness on feet  Difficulty in walking, not elsewhere classified  Abnormality of gait and mobility  Rationale for Evaluation and Treatment Rehabilitation  PERTINENT HISTORY: Pt Recent L MCA, s/p thrombectomy and ICA stenting. Pt with speech deficits. Pt has been somewhat unsteady with a few falls falling backward however DTR reports this is not new, but has been ongoing. Pt seen at our Sports clinic in 2018 for low back pain issues in the setting of spinal stenosis.  PRECAUTIONS: N/A  SUBJECTIVE:  Pt doing ok. Got her ankle brace, but cannot put it on herself yet.    PAIN:  Are you having pain? None at rest, but does feel pain when walking.   TODAY'S TREATMENT:     Therapeutic Exercises:  -Pre gait BP assessment: 165/66mHg 72bpm -Overground AMB c 4WW x 4545f 5 minutes 20 sec, foot scuffing after 3 minutes -BP assessment: 169/776m 74bpm -Rt ankle DF heel on floor with 2lb AW 1x15 -Rt ankle PF, forefoot on step 1x15 -Rt ankle DF heel on floor with 2lb AW 1x15 -Rt ankle PF, forefoot on step 1x15 -STS from chair, cues for hands on knees ultimately really struggles to perform with success due to weakness and limited anterior weight shift -STS from chair + airex 1x10, 1x10 cues for full upright stance, hands on knees, still struggles with false starts - standing marching c Rt leg 3lb weight, unable to maintain height over 12 reps    PATIENT EDUCATION: Education details: exercise technique, body mechanics, goals, indications for  progress/plan Person educated: Patient Education method: Explanation, Demonstration, Tactile cues, and Verbal cues Education comprehension: verbalized understanding, returned demonstration, verbal cues required, tactile cues required, and needs further education   HOME EXERCISE PROGRAM:  *see prior notes      PT Short Term Goals       PT SHORT TERM GOAL #1   Title Independence in HEP for improved balance and strength    Baseline Issued at eval  8/3: Pt reports she is performing her HEP every other day, not performing all her exercises   Time 3    Period Weeks    Status On-going    Target Date 04/11/2022       PT SHORT TERM GOAL #2   Title 5xSTS hands free <19sec in order to indicate decreased fall risk   Baseline Eval: >24sec hands free; 8/3:  32 sec hands free, CGA   Time 4    Period Weeks    Status  Revised    Target Date 04/11/2022       PT SHORT TERM GOAL #3   Title The pt will demonstrate Ankle DF MMT 5/5 bilat for improved gait mechanics and functional mobility   Baseline Eval: 4+/5 Right, 4/5 Left; 8/3: 4+/5 B   Time 4    Period Weeks    Status Revised     Target Date 04/11/2022               PT Long Term Goals       PT LONG TERM GOAL #1   Title Pt will ambulate at least 1000 ft on the 6MWT for progression to community ambulator and improve gait ability   Baseline July 2019: 1372f; 03/10/22: 580 ft with 4WW   Time 8    Period Weeks    Status Revised    Target Date 05/09/2022       PT LONG TERM GOAL #2   Title Pt will complete 5xSTS <13 sec hands free to indicate decreased fall risk and increased BLE power.   Baseline eval: >24sec hands free; 8/3: 32 sec hands free, CGA   Time 8    Period Weeks    Status Revised    Target Date 05/09/2022       PT LONG TERM GOAL #3   Title Pt to demonstrate retroAMB without device >0.242m s LOB to demonstrate improved ankle righting and control.    Baseline Eval: <0.0163mand c 3 LOB 03/10/22: .08 m/s with 4WW     Time 10    Period Weeks    Status Partially Met   Target Date 06/06/2022      PT LONG TERM GOAL #4   Title The pt FOTO Score will increase by >15 points to indicate improved functional mobility and QOL    Baseline eval: 48; 03/10/2022: 61% (previously 62% on 02/02/22)    Time 12    Period Weeks    Status Achieved (revised to indicate functional purpose of goal)    Target Date              Plan     Clinical Impression Statement Continued with targeted interventions to improve goals of care. Pt has not made significant progress toward her goals of improving walking distance, 5xSTS, and ankle strength. Pt remains motivated, requires some recovery breaks intermittently with fatigue. Rt foot scuff is difficult to correct after 3 minutes AMB. Other areas of session do not indicate any significant improvement in her transfers strength, retro gait confidence or speed. Pt does seem to show slight improvements in ankle strength, but grossly rt leg is easily fatigued to failure. BP assessment remains outside of goal range, unclear if any action has been taken by her PCP. If lack of progress persists will consider DC from PT in next few visits as it may be that patient has reached her current potential. d    Personal Factors and Comorbidities Past/Current Experience    Examination-Activity Limitations Dressing;Transfers;Bend;Locomotion Level;Stairs;Stand    Examination-Participation Restrictions Cleaning;Shop;Meal Prep;[place];Laundry    Stability/Clinical Decision Making Evolving/Moderate complexity    Rehab Potential Good    Clinical Impairments Affecting Rehab Potential (+) motivation, social support (-) age, chronicity of pain, fairly sedentary lifestyle    PT Frequency 2x / week    PT Duration 12 weeks    PT Treatment/Interventions Aquatic Therapy;Moist Heat;Functional mobility training;Dry needling;Neuromuscular re-education;Traction;Ultrasound;Iontophoresis 4mg29m Dexamethasone;ADLs/Self  Care Home Management;Cryotherapy;Electrical Stimulation;Therapeutic exercise;Therapeutic activities;Patient/family education;Taping;Gait training;Stair training;DME Instruction;Balance training;Passive range of  motion    PT Next Visit Plan Progress safety with mobility and LE strengthening as appropriate.    PT Home Exercise Plan Eval: seated heel raises, toe raises, and STS from elevated surface. 01/04/2022=Access Code: [email or web address]  URL: [email or web address]    Consulted and Agree with Plan of Care Patient;Family member/caregiver    Family Member Consulted Kristopher Oppenheim PT  03/16/22  3:33 PM, 03/16/22 Etta Grandchild, PT, DPT Physical Therapist - [place] [phone]

## 2022-03-22 ENCOUNTER — Ambulatory Visit: Payer: Medicare Other

## 2022-03-22 ENCOUNTER — Encounter: Payer: Medicare Other | Admitting: Occupational Therapy

## 2022-03-22 ENCOUNTER — Ambulatory Visit: Payer: Medicare Other | Admitting: Speech Pathology

## 2022-03-22 DIAGNOSIS — I63512 Cerebral infarction due to unspecified occlusion or stenosis of left middle cerebral artery: Secondary | ICD-10-CM

## 2022-03-22 DIAGNOSIS — R4701 Aphasia: Secondary | ICD-10-CM

## 2022-03-22 DIAGNOSIS — R278 Other lack of coordination: Secondary | ICD-10-CM

## 2022-03-22 DIAGNOSIS — R269 Unspecified abnormalities of gait and mobility: Secondary | ICD-10-CM

## 2022-03-22 DIAGNOSIS — R471 Dysarthria and anarthria: Secondary | ICD-10-CM

## 2022-03-22 DIAGNOSIS — R2681 Unsteadiness on feet: Secondary | ICD-10-CM

## 2022-03-22 DIAGNOSIS — R2689 Other abnormalities of gait and mobility: Secondary | ICD-10-CM

## 2022-03-22 DIAGNOSIS — R296 Repeated falls: Secondary | ICD-10-CM

## 2022-03-22 DIAGNOSIS — R262 Difficulty in walking, not elsewhere classified: Secondary | ICD-10-CM

## 2022-03-22 DIAGNOSIS — M6281 Muscle weakness (generalized): Secondary | ICD-10-CM

## 2022-03-22 NOTE — Therapy (Signed)
OUTPATIENT SPEECH LANGUAGE PATHOLOGY TREATMENT NOTE AND RECERTIFICATION   Patient Name: Cassidy Hernandez MRN: [id number] DOB:07-Sep-1940, 81 y.o., female Today's Date: 03/22/2022   REFERRING PROVIDER: Merrilee Seashore, NP     End of Session - 03/22/22 1219     Visit Number 27    Number of Visits 17    Date for SLP Re-Evaluation 06/14/22    Authorization Type BC[employer] MCR; $10 copay    Authorization - Visit Number 7    Progress Note Due on Visit 10    SLP Start Time 1100    SLP Stop Time  1200    SLP Time Calculation (min) 60 min    Activity Tolerance Patient tolerated treatment well                 Past Medical History:  Diagnosis Date   Anginal pain ([place])    Aortic atherosclerosis    Cancer ([place])    Carotid artery stenosis 02/01/2010   a.) Doppler 65/46/5035: 46% LICA and 56% RICA. b.) Doppler 81/27/5170: >01% LICA and 74% RICA. c.) Doppler 10/25/16: >70% Bilater ICAs   Chronic bilateral low back pain with left-sided sciatica    CKD (chronic kidney disease), stage III (HCC)    Coronary artery disease    a.) LHC 12/24/2014 -> small LAD system; diffuse CTO of LAD. 60% RCA. LM and LCx with no sig disease. no intervention; med mgmt.   Degenerative arthritis    Diastolic dysfunction    a.) TTE 09/20/2018: EF 55%, G1DD, mild LAE and RVE, triv-mild panvalvular regurgitation.   Dysarthria    GERD (gastroesophageal reflux disease)    Hepatic steatosis    History of kidney stones    HLD (hyperlipidemia)    Hypertension    Kidney stone    Lumbar radiculopathy    Major depression in remission ([place])    PVD (peripheral vascular disease) ([place])    Renal cyst, right 06/02/2015   a.) CT 06/02/2015 --> 4.4 cm RIGHT renal mass; MRI recommended. b.) MRI 06/16/2015 --> 3.8 x 4.2 x 4.0 cm proteinaceous/hemmorhagic cyst.   Subclavian steal syndrome    a.) LHC 12/24/2014 --> tight eccentric 90% ostial stenosis with damping.   T2DM (type 2 diabetes mellitus) [facility])    Past Surgical History:   Procedure Laterality Date   ABDOMINAL HYSTERECTOMY     AUGMENTATION MAMMAPLASTY Bilateral 1980   BACK SURGERY  09/2018   BICEPT TENODESIS  06/10/2021   Procedure: BICEPS TENODESIS;  Surgeon: Corky Mull, MD;  Location: [facility];  Service: Orthopedics;;   CARDIAC CATHETERIZATION Left 12/24/2014   Procedure: LEFT HEARD CATHETERIZATION; Location: [facility]; Surgeon: Laurence Aly, MD   CHOLECYSTECTOMY     COLONOSCOPY N/A 02/11/2016   Procedure: COLONOSCOPY;  Surgeon: Manya Silvas, MD;  Location: [facility] ENDOSCOPY;  Service: Endoscopy;  Laterality: N/A;   IR CT HEAD LTD  11/16/2021   IR CT HEAD LTD  11/16/2021   IR INTRAVSC STENT CERV CAROTID W/O EMB-PROT MOD SED INC ANGIO  11/18/2021   IR PERCUTANEOUS ART THROMBECTOMY/INFUSION INTRACRANIAL INC DIAG ANGIO  11/16/2021   RADIOLOGY WITH ANESTHESIA N/A 11/16/2021   Procedure: IR WITH ANESTHESIA- CODE STROKE;  Surgeon: Radiologist, Medication, MD;  Location: [place];  Service: Radiology;  Laterality: N/A;   REVERSE SHOULDER ARTHROPLASTY Right 06/10/2021   Procedure: REVERSE SHOULDER ARTHROPLASTY;  Surgeon: Corky Mull, MD;  Location: [facility];  Service: Orthopedics;  Laterality: Right;   Patient Active Problem List   Diagnosis Date Noted  Benign hypertension with CKD (chronic kidney disease) stage III ([place]) 03/02/2022   CAD (coronary artery disease) 03/02/2022   Hypercholesteremia 03/02/2022   Right hemiparesis ([place]) 12/16/2021   Acute ischemic left MCA stroke ([place]) 11/16/2021   Middle cerebral artery embolism, 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   Status post reverse arthroplasty of shoulder, right 06/10/2021   Traumatic complete tear of right rotator cuff 02/26/2021   Major depression in remission ([place]) 06/25/2020   Dysarthria 09/20/2018   Gastroesophageal reflux disease without esophagitis 09/20/2018   Lumbar radiculopathy 09/20/2018   Healthcare maintenance 05/02/2018   Controlled type 2 diabetes mellitus with stage 3 chronic kidney disease, without long-term  current use of insulin ([place]) 01/30/2018   Chronic bilateral low back pain with left-sided sciatica 12/07/2016   Hepatic steatosis 11/23/2015   Carotid artery disease ([place]) 01/06/2010    ONSET DATE: 11/16/21   REFERRING DIAG: L MCA CVA   HPI: Pt is a 81 y.o. 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 Patient is s/p thrombectomy with left ICA stent placement 11/16/21. She completed inpatient rehabilitation at [facility] 11/25/21-12/07/21 and was discharged home with family. Patient known to this clinic from prior course of voice therapy in 2018. Pt with documented hx of voice and speech changes (dysarthria) per videostroboscopy 12/26/17; did not attend therapy.   THERAPY DIAG:  Aphasia  Dysarthria and anarthria  Rationale for Evaluation and Treatment Rehabilitation  SUBJECTIVE: "It's not good," pt, re: her homework.  Pt accompanied by: Elvin So, caregiver  PAIN: No Are you having pain? No     OBJECTIVE:   TODAY'S TREATMENT: Reviewed pt's home activity (generating sentences) and assisted with error correction in cases where pt was able to ID a mistake by re-reading. Pt required mod-max A (tense, syntax, omitted words). Facilitated use of dysarthria compensations at paragraph level during reading task (mystery story), and targeted reading comprehension with use of supportive techniques (auditory support, written keywords, rephrasing). Pt answered comprehension questions 60% acc, 80% acc with mod cues to visual aids/supports.   PATIENT EDUCATION: Education details: TalkPath activities for home Person educated: Patient Education method: Explanation, Demonstration, and Verbal cues Education comprehension: verbalized understanding and needs further education    SLP Short Term Goals       SLP SHORT TERM GOAL #1   Title Pt will communicate emergency information 100% accuracy using visual aid if necessary.    Baseline 100% 01/24/22    Time 6    Period Weeks     Status Achieved    Target Date 01/27/22      SLP SHORT TERM GOAL #2   Title Pt will generate at least 4 descriptors of target word 80% of the time using semantic feature analysis to improve abilities in wordfinding and resolving communication breakdowns.    Baseline 85% 01/24/22    Time 6    Period Weeks    Status Achieved   Target Date 01/27/22      SLP SHORT TERM GOAL #3   Title Patient will request repetition/rephrasing to aid auditory comprehension of mod complex information (multi-step commands, conversation) in 80% of opportunities.    Baseline 85% 01/24/22    Time 6    Period Weeks    Status Achieved    Target Date 01/27/22      SLP SHORT TERM GOAL #4   Title Pt will read and reply to simple texts/emails 80% accuracy with use of accessibility features as necessary.    Baseline 01/24/22, extended time, requires  occasional cues, continues to require cuing with accessibility features    Time 6    Period Weeks    Status Partially Met    Target Date 01/27/22      SLP SHORT TERM GOAL #5   Title Pt will write sentences of 5-7 words 90% accuracy (self correction allowed) with occasional min cues.    Time 10    Period --   sessions   Status New      Additional Short Term Goals   Additional Short Term Goals Yes      SLP SHORT TERM GOAL #6   Title Pt will use intelligibility strategies (loudness, breath support, overarticulation) for sentence level responses 90% accuracy with min cues.    Time 10    Period --   sessions   Status New      SLP SHORT TERM GOAL #7   Title Pt will use aphasia compensations in 10 minutes moderately complex conversation with fewer than 3 listener questions necessary for clarification of message.    Time 10    Period --   sessions   Status New              SLP Long Term Goals       SLP LONG TERM GOAL #1   Title Pt will engage in 5-8 minutes simple-mod complex conversation re: topic of interest with supported conversation, aphasia  compensations.    Time 12    Period Weeks    Status Achieved      SLP LONG TERM GOAL #2   Title Pt will ID and attempt repair of communication breakdowns >80% of the time in session.    Time 12    Period Weeks    Status Achieved      SLP LONG TERM GOAL #3   Title Pt will demonstrate reading comprehension of mod complex short story or articles of interest with technology supports (text-to-speech, ereader, etc) by generating summary or answering questions >80% accuracy.    Baseline 02/23/22: Reads/comprehends simple paragraphs unassisted.    Time 12    Period Weeks    Status Revised   and renewed for 12 weeks   Target Date 03/08/22      SLP LONG TERM GOAL #4   Title Pt will self-advocate for communication needs in community or family outings (per her report) to increase confidence in social interactions.    Time 12    Period Weeks    Status New    Target Date 03/08/22      SLP LONG TERM GOAL #5   Title Pt will report reduced frustration with communication breakdowns by implementing aphasia compensation outside [place] room.    Baseline 02/24/22: frustrated 3 times per day on average    Time 12    Period Weeks    Status New    Target Date 03/08/22      Additional Long Term Goals   Additional Long Term Goals Yes      SLP LONG TERM GOAL #6   Title Pt will use intelligibility strategies (loudness, breath support, overarticulation) 80% accuracy in 10 minutes conversation with modified independence.    Time 12    Period Weeks    Status New    Target Date 05/25/22              Plan    Clinical Impression Statement Pt's aphasia appearing more mild-moderate in recent sessions, and dysarthria/vocal quality/intelligibility significantly improved with use of compensations, for which  pt requires min-moderate cues in spontaneous conversation. Pt exhibited occasional min frustration with her errors in writing tasks today. Benefits from supportive techniques for reading comprehension;  requires mod-max support for middle-school level reading today. Pt is progressing toward goals and benefits from continued skilled ST intervention per plan of care. Recertification completed today; goals as stated above remain appropriate.    Speech Therapy Frequency 2x / week    Duration 2 weeks    Treatment/Interventions SLP instruction and feedback;Multimodal communcation approach;Patient/family education;Functional tasks;Cueing hierarchy;Compensatory techniques;Compensatory strategies    Potential to Achieve Goals Good    Potential Considerations Family/community support    SLP Home Exercise Plan TalkPath Therapy    Consulted and Agree with Plan of Care Patient              Deneise Lever, MS, CCC-SLP Speech-Language Pathologist ([place], [place] 03/22/2022, 12:26 PM

## 2022-03-22 NOTE — Therapy (Signed)
OUTPATIENT PHYSICAL THERAPY TREATMENT NOTE/RECERT  Patient Name: Cassidy Hernandez MRN: [id number] DOB:07/21/1941, 81 y.o., female Today's Date: 03/22/2022  PCP: Kirk Ruths, MD REFERRING PROVIDER: Rayann Heman, NP         Past Medical History:  Diagnosis Date   Anginal pain [facility])    Aortic atherosclerosis    Cancer [facility])    Carotid artery stenosis 02/01/2010   a.) Doppler 96/78/9381: 01% LICA and 75% RICA. b.) Doppler 06/01/8526: >78% LICA and 24% RICA. c.) Doppler 10/25/16: >70% Bilater ICAs   Chronic bilateral low back pain with left-sided sciatica    CKD (chronic kidney disease), stage III ([place])    Coronary artery disease    a.) LHC 12/24/2014 -> small LAD system; diffuse CTO of LAD. 60% RCA. LM and LCx with no sig disease. no intervention; med mgmt.   Degenerative arthritis    Diastolic dysfunction    a.) TTE 09/20/2018: EF 55%, G1DD, mild LAE and RVE, triv-mild panvalvular regurgitation.   Dysarthria    GERD (gastroesophageal reflux disease)    Hepatic steatosis    History of kidney stones    HLD (hyperlipidemia)    Hypertension    Kidney stone    Lumbar radiculopathy    Major depression in remission ([place])    PVD (peripheral vascular disease) ([place])    Renal cyst, right 06/02/2015   a.) CT 06/02/2015 --> 4.4 cm RIGHT renal mass; MRI recommended. b.) MRI 06/16/2015 --> 3.8 x 4.2 x 4.0 cm proteinaceous/hemmorhagic cyst.   Subclavian steal syndrome    a.) LHC 12/24/2014 --> tight eccentric 90% ostial stenosis with damping.   T2DM (type 2 diabetes mellitus) [facility])    Past Surgical History:  Procedure Laterality Date   ABDOMINAL HYSTERECTOMY     AUGMENTATION MAMMAPLASTY Bilateral 1980   BACK SURGERY  09/2018   BICEPT TENODESIS  06/10/2021   Procedure: BICEPS TENODESIS;  Surgeon: Corky Mull, MD;  Location: [facility];  Service: Orthopedics;;   CARDIAC CATHETERIZATION Left 12/24/2014   Procedure: LEFT HEARD CATHETERIZATION; Location: [facility]; Surgeon: Laurence Aly,  MD   CHOLECYSTECTOMY     COLONOSCOPY N/A 02/11/2016   Procedure: COLONOSCOPY;  Surgeon: Manya Silvas, MD;  Location[facility];  Service: Endoscopy;  Laterality: N/A;   IR CT HEAD LTD  11/16/2021   IR CT HEAD LTD  11/16/2021   IR INTRAVSC STENT CERV CAROTID W/O EMB-PROT MOD SED INC ANGIO  11/18/2021   IR PERCUTANEOUS ART THROMBECTOMY/INFUSION INTRACRANIAL INC DIAG ANGIO  11/16/2021   RADIOLOGY WITH ANESTHESIA N/A 11/16/2021   Procedure: IR WITH ANESTHESIA- CODE STROKE;  Surgeon: Radiologist, Medication, MD;  Location: [place];  Service: Radiology;  Laterality: N/A;   REVERSE SHOULDER ARTHROPLASTY Right 06/10/2021   Procedure: REVERSE SHOULDER ARTHROPLASTY;  Surgeon: Corky Mull, MD;  Location: [facility];  Service: Orthopedics;  Laterality: Right;   Patient Active Problem List   Diagnosis Date Noted   Benign hypertension with CKD (chronic kidney disease) stage III ([place]) 03/02/2022   CAD (coronary artery disease) 03/02/2022   Hypercholesteremia 03/02/2022   Right hemiparesis ([place]) 12/16/2021   Acute ischemic left MCA stroke ([place]) 11/16/2021   Middle cerebral artery embolism, 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   Status post reverse arthroplasty of shoulder, right 06/10/2021   Traumatic complete tear of right rotator cuff 02/26/2021   Major depression in remission ([place]) 06/25/2020   Dysarthria 09/20/2018   Gastroesophageal reflux disease without esophagitis 09/20/2018   Lumbar radiculopathy 09/20/2018   Healthcare maintenance 05/02/2018   Controlled  type 2 diabetes mellitus with stage 3 chronic kidney disease, without long-term current use of insulin ([place]) 01/30/2018   Chronic bilateral low back pain with left-sided sciatica 12/07/2016   Hepatic steatosis 11/23/2015   Carotid artery disease ([place]) 01/06/2010    REFERRING DIAG: J19.147 (ICD-10-CM) - Cerebral infarction due to unspecified occlusion or stenosis of left middle cerebral artery  THERAPY DIAG:  No diagnosis found.  Rationale for Evaluation  and Treatment Rehabilitation  PERTINENT HISTORY: Pt Recent L MCA, s/p thrombectomy and ICA stenting. Pt with speech deficits. Pt has been somewhat unsteady with a few falls falling backward however DTR reports this is not new, but has been ongoing. Pt seen at our Sports clinic in 2018 for low back pain issues in the setting of spinal stenosis.  PRECAUTIONS: N/A  SUBJECTIVE:  Pt feels like feels okay today, no pain. No soreness after last session. No falls or LOB since las session. Her goals for PT are: balance, walking without rollator, and working on R foot/ankle.   PAIN:  Are you having pain? None at rest, but does feel pain sometimes in the right leg/foot   TODAY'S TREATMENT:   Therapeutic Exercises:  -Pre gait BP assessment: 141/61 mmHg, 75bpm -Overground AMB with rollator x 146f; intermittent R toe catching, cues for exaggerated hip/knee flexion to improve foot clearance -BP assessment: 167/67 mmHg, 80 bpm  Neuromuscular Re-Ed: - high knee marching in // bars with 2# AW, x2 laps with BUE support, x2 laps with UUE support - anterior step ups x8ea 2# AW, BUE support; emphasis on eccentric lowering - lateral step ups x8ea 2# AW, BUE support; emphasis on eccentric lowering, neutral positioning of hip/foot with initial step, and motor planning with foot placement after stepping down -BP assessment: 157/69 mmHg, 79 bpm - STS from chair with bil hands on knees, emphasis on momentum and anterior translation, x10 w/ CGA - STS from chair with bil hands on knees, emphasis on eccentric lowering, poor eccentric control noted after 1/2 way descent  Not performed today: -Rt ankle DF heel on floor with 2lb AW 1x15 -Rt ankle PF, forefoot on step 1x15 -Rt ankle DF heel on floor with 2lb AW 1x15 -Rt ankle PF, forefoot on step 1x15   PATIENT EDUCATION: Education details: exercise technique, body mechanics, goals, indications for progress/plan, BP management, signs/symptoms of  hypertension Person educated: Patient Education method: Explanation, Demonstration, Tactile cues, and Verbal cues Education comprehension: verbalized understanding, returned demonstration, verbal cues required, tactile cues required, and needs further education   HOME EXERCISE PROGRAM:  *see prior notes     PT Short Term Goals       PT SHORT TERM GOAL #1   Title Independence in HEP for improved balance and strength    Baseline Issued at eval  8/3: Pt reports she is performing her HEP every other day, not performing all her exercises   Time 3    Period Weeks    Status On-going    Target Date 04/11/2022       PT SHORT TERM GOAL #2   Title 5xSTS hands free <19sec in order to indicate decreased fall risk   Baseline Eval: >24sec hands free; 8/3:  32 sec hands free, CGA   Time 4    Period Weeks    Status Revised    Target Date 04/11/2022       PT SHORT TERM GOAL #3   Title The pt will demonstrate Ankle DF MMT 5/5 bilat for improved gait mechanics  and functional mobility   Baseline Eval: 4+/5 Right, 4/5 Left; 8/3: 4+/5 B   Time 4    Period Weeks    Status Revised     Target Date 04/11/2022               PT Long Term Goals       PT LONG TERM GOAL #1   Title Pt will ambulate at least 1000 ft on the 6MWT for progression to community ambulator and improve gait ability   Baseline July 2019: 1377f; 03/10/22: 580 ft with 4WW   Time 8    Period Weeks    Status Revised    Target Date 05/09/2022       PT LONG TERM GOAL #2   Title Pt will complete 5xSTS <13 sec hands free to indicate decreased fall risk and increased BLE power.   Baseline eval: >24sec hands free; 8/3: 32 sec hands free, CGA   Time 8    Period Weeks    Status Revised    Target Date 05/09/2022       PT LONG TERM GOAL #3   Title Pt to demonstrate retroAMB without device >0.238m s LOB to demonstrate improved ankle righting and control.    Baseline Eval: <0.0167mand c 3 LOB 03/10/22: .08 m/s with 4WW     Time 10    Period Weeks    Status Partially Met   Target Date 06/06/2022      PT LONG TERM GOAL #4   Title The pt FOTO Score will increase by >15 points to indicate improved functional mobility and QOL    Baseline eval: 48; 03/10/2022: 61% (previously 62% on 02/02/22)    Time 12    Period Weeks    Status Achieved (revised to indicate functional purpose of goal)    Target Date              Plan     Clinical Impression Statement Pt tolerated progression of treatment today without c/o increased soreness or pain post session. Treatment continues to address LE strengthening, balance, and safety with functional mobility. Pt able to demonstrate improvements in STS with anterior translation and achieving full upright standing on first attempt with BUE supported on knees. Standing interventions focused on adequate RLE foot clearance, as well as quad eccentric control with step downs. Pt making progress towards goals and from session-session. BP monitored throughout session, demonstrating improvements in HTN with standing interventions and seated rest breaks compared to initial BP reading. Pt will continue to benefit from skilled OPPT services to address deficits and improve overall safety and independence with functional mobility. Will continue per POC.    Personal Factors and Comorbidities Past/Current Experience    Examination-Activity Limitations Dressing;Transfers;Bend;Locomotion Level;Stairs;Stand    Examination-Participation Restrictions Cleaning;Shop;Meal Prep;[place];Laundry    Stability/Clinical Decision Making Evolving/Moderate complexity    Rehab Potential Good    Clinical Impairments Affecting Rehab Potential (+) motivation, social support (-) age, chronicity of pain, fairly sedentary lifestyle    PT Frequency 2x / week    PT Duration 12 weeks    PT Treatment/Interventions Aquatic Therapy;Moist Heat;Functional mobility training;Dry needling;Neuromuscular  re-education;Traction;Ultrasound;Iontophoresis 4mg47m Dexamethasone;ADLs/Self Care Home Management;Cryotherapy;Electrical Stimulation;Therapeutic exercise;Therapeutic activities;Patient/family education;Taping;Gait training;Stair training;DME Instruction;Balance training;Passive range of motion    PT Next Visit Plan Progress standing functional interventions as appropriate, updated HEP, assess hip mobility for potential restrictions   PT Home Exercise Plan Eval: seated heel raises, toe raises, and STS from elevated surface. 01/04/2022=Access Code: [email or web address]  URL: [email or web address]    Consulted and Agree with Plan of Care Patient;Family member/caregiver    Family Member Consulted Everlene Other, PT, DPT 11:27 AM,03/22/22 Physical Therapist - [facility]

## 2022-03-24 ENCOUNTER — Encounter: Payer: Medicare Other | Admitting: Occupational Therapy

## 2022-03-24 ENCOUNTER — Ambulatory Visit: Payer: Medicare Other

## 2022-03-24 ENCOUNTER — Ambulatory Visit: Payer: Medicare Other | Admitting: Speech Pathology

## 2022-03-24 DIAGNOSIS — R4701 Aphasia: Secondary | ICD-10-CM | POA: Diagnosis not present

## 2022-03-24 DIAGNOSIS — R2681 Unsteadiness on feet: Secondary | ICD-10-CM

## 2022-03-24 DIAGNOSIS — R262 Difficulty in walking, not elsewhere classified: Secondary | ICD-10-CM

## 2022-03-24 DIAGNOSIS — R471 Dysarthria and anarthria: Secondary | ICD-10-CM

## 2022-03-24 DIAGNOSIS — M79671 Pain in right foot: Secondary | ICD-10-CM

## 2022-03-24 DIAGNOSIS — M6281 Muscle weakness (generalized): Secondary | ICD-10-CM

## 2022-03-24 NOTE — Therapy (Signed)
OUTPATIENT SPEECH LANGUAGE PATHOLOGY TREATMENT NOTE AND RECERTIFICATION   Patient Name: Cassidy Hernandez MRN: [id number] DOB:[birth year], 81 y.o., female Today's Date: 03/24/2022   REFERRING PROVIDER: Merrilee Seashore, NP     End of Session - 03/24/22 1405     Visit Number 28    Number of Visits 65    Date for SLP Re-Evaluation 06/14/22    Authorization Type BCBS MCR; $10 copay    Authorization - Visit Number 8    Progress Note Due on Visit 10    SLP Start Time 1400    SLP Stop Time  1500    SLP Time Calculation (min) 60 min    Activity Tolerance Patient tolerated treatment well                 Past Medical History:  Diagnosis Date   Anginal pain ([place])    Aortic atherosclerosis    Cancer ([place])    Carotid artery stenosis 02/01/2010   a.) Doppler 60/60/0459: 97% LICA and 74% RICA. b.) Doppler 14/23/9532: >02% LICA and 33% RICA. c.) Doppler 10/25/16: >70% Bilater ICAs   Chronic bilateral low back pain with left-sided sciatica    CKD (chronic kidney disease), stage III (HCC)    Coronary artery disease    a.) LHC 12/24/2014 -> small LAD system; diffuse CTO of LAD. 60% RCA. LM and LCx with no sig disease. no intervention; med mgmt.   Degenerative arthritis    Diastolic dysfunction    a.) TTE 09/20/2018: EF 55%, G1DD, mild LAE and RVE, triv-mild panvalvular regurgitation.   Dysarthria    GERD (gastroesophageal reflux disease)    Hepatic steatosis    History of kidney stones    HLD (hyperlipidemia)    Hypertension    Kidney stone    Lumbar radiculopathy    Major depression in remission ([place])    PVD (peripheral vascular disease) ([place])    Renal cyst, right 06/02/2015   a.) CT 06/02/2015 --> 4.4 cm RIGHT renal mass; MRI recommended. b.) MRI 06/16/2015 --> 3.8 x 4.2 x 4.0 cm proteinaceous/hemmorhagic cyst.   Subclavian steal syndrome    a.) LHC 12/24/2014 --> tight eccentric 90% ostial stenosis with damping.   T2DM (type 2 diabetes mellitus) [facility])    Past Surgical History:   Procedure Laterality Date   ABDOMINAL HYSTERECTOMY     AUGMENTATION MAMMAPLASTY Bilateral 1980   BACK SURGERY  09/2018   BICEPT TENODESIS  06/10/2021   Procedure: BICEPS TENODESIS;  Surgeon: Corky Mull, MD;  Location: [facility];  Service: Orthopedics;;   CARDIAC CATHETERIZATION Left 12/24/2014   Procedure: LEFT HEARD CATHETERIZATION; Location: [facility]; Surgeon: Laurence Aly, MD   CHOLECYSTECTOMY     COLONOSCOPY N/A 02/11/2016   Procedure: COLONOSCOPY;  Surgeon: Manya Silvas, MD;  Location: [facility]COPY;  Service: Endoscopy;  Laterality: N/A;   IR CT HEAD LTD  11/16/2021   IR CT HEAD LTD  11/16/2021   IR INTRAVSC STENT CERV CAROTID W/O EMB-PROT MOD SED INC ANGIO  11/18/2021   IR PERCUTANEOUS ART THROMBECTOMY/INFUSION INTRACRANIAL INC DIAG ANGIO  11/16/2021   RADIOLOGY WITH ANESTHESIA N/A 11/16/2021   Procedure: IR WITH ANESTHESIA- CODE STROKE;  Surgeon: Radiologist, Medication, MD;  Location: [facility];  Service: Radiology;  Laterality: N/A;   REVERSE SHOULDER ARTHROPLASTY Right 06/10/2021   Procedure: REVERSE SHOULDER ARTHROPLASTY;  Surgeon: Corky Mull, MD;  Location: [facility];  Service: Orthopedics;  Laterality: Right;   Patient Active Problem List   Diagnosis Date Noted  Benign hypertension with CKD (chronic kidney disease) stage III ([place]) 03/02/2022   CAD (coronary artery disease) 03/02/2022   Hypercholesteremia 03/02/2022   Right hemiparesis ([place]) 12/16/2021   Acute ischemic left MCA stroke ([place]) 11/16/2021   Middle cerebral artery embolism, 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   Status post reverse arthroplasty of shoulder, right 06/10/2021   Traumatic complete tear of right rotator cuff 02/26/2021   Major depression in remission ([place]) 06/25/2020   Dysarthria 09/20/2018   Gastroesophageal reflux disease without esophagitis 09/20/2018   Lumbar radiculopathy 09/20/2018   Healthcare maintenance 05/02/2018   Controlled type 2 diabetes mellitus with stage 3 chronic kidney disease, without long-term  current use of insulin ([place]) 01/30/2018   Chronic bilateral low back pain with left-sided sciatica 12/07/2016   Hepatic steatosis 11/23/2015   Carotid artery disease ([place]) 01/06/2010    ONSET DATE: 11/16/21   REFERRING DIAG: L MCA CVA   HPI: Pt is a 81 y.o. 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 Patient is s/p thrombectomy with left ICA stent placement 11/16/21. She completed inpatient [facility] at [facility] 11/25/21-12/07/21 and was discharged home with family. Patient known to this clinic from prior course of voice therapy in 2018. Pt with documented hx of voice and speech changes (dysarthria) per videostroboscopy 12/26/17; did not attend therapy.   THERAPY DIAG:  Dysarthria and anarthria  Aphasia  Rationale for Evaluation and Treatment Rehabilitation  SUBJECTIVE: Pt requests help with TalkPath app  Pt accompanied by: Elvin So, caregiver  PAIN: No Are you having pain? Yes: NPRS scale: 7/10 Pain location: right foot     OBJECTIVE:   TODAY'S TREATMENT: Continued training use of dysarthria compensations and vocal intensity, breath pacing for vocal quality at paragraph level during reading task (mystery story), and targeted reading comprehension with use of supportive techniques (auditory support, written keywords, rephrasing). Pt completed sentence fill-ins 80% acc, and generated sentence responses to simple question prompts with occasional mod cues.    PATIENT EDUCATION: Education details: reading aloud at home Person educated: Patient Education method: Explanation, Demonstration, and Verbal cues Education comprehension: verbalized understanding and needs further education    SLP Short Term Goals       SLP SHORT TERM GOAL #1   Title Pt will communicate emergency information 100% accuracy using visual aid if necessary.    Baseline 100% 01/24/22    Time 6    Period Weeks    Status Achieved    Target Date 01/27/22      SLP SHORT TERM GOAL  #2   Title Pt will generate at least 4 descriptors of target word 80% of the time using semantic feature analysis to improve abilities in wordfinding and resolving communication breakdowns.    Baseline 85% 01/24/22    Time 6    Period Weeks    Status Achieved   Target Date 01/27/22      SLP SHORT TERM GOAL #3   Title Patient will request repetition/rephrasing to aid auditory comprehension of mod complex information (multi-step commands, conversation) in 80% of opportunities.    Baseline 85% 01/24/22    Time 6    Period Weeks    Status Achieved    Target Date 01/27/22      SLP SHORT TERM GOAL #4   Title Pt will read and reply to simple texts/emails 80% accuracy with use of accessibility features as necessary.    Baseline 01/24/22, extended time, requires occasional cues, continues to require cuing with accessibility features  Time 6    Period Weeks    Status Partially Met    Target Date 01/27/22      SLP SHORT TERM GOAL #5   Title Pt will write sentences of 5-7 words 90% accuracy (self correction allowed) with occasional min cues.    Time 10    Period --   sessions   Status New      Additional Short Term Goals   Additional Short Term Goals Yes      SLP SHORT TERM GOAL #6   Title Pt will use intelligibility strategies (loudness, breath support, overarticulation) for sentence level responses 90% accuracy with min cues.    Time 10    Period --   sessions   Status New      SLP SHORT TERM GOAL #7   Title Pt will use aphasia compensations in 10 minutes moderately complex conversation with fewer than 3 listener questions necessary for clarification of message.    Time 10    Period --   sessions   Status New              SLP Long Term Goals       SLP LONG TERM GOAL #1   Title Pt will engage in 5-8 minutes simple-mod complex conversation re: topic of interest with supported conversation, aphasia compensations.    Time 12    Period Weeks    Status Achieved      SLP  LONG TERM GOAL #2   Title Pt will ID and attempt repair of communication breakdowns >80% of the time in session.    Time 12    Period Weeks    Status Achieved      SLP LONG TERM GOAL #3   Title Pt will demonstrate reading comprehension of mod complex short story or articles of interest with technology supports (text-to-speech, ereader, etc) by generating summary or answering questions >80% accuracy.    Baseline 02/23/22: Reads/comprehends simple paragraphs unassisted.    Time 12    Period Weeks    Status Revised   and renewed for 12 weeks   Target Date 03/08/22      SLP LONG TERM GOAL #4   Title Pt will self-advocate for communication needs in community or family outings (per her report) to increase confidence in social interactions.    Time 12    Period Weeks    Status New    Target Date 03/08/22      SLP LONG TERM GOAL #5   Title Pt will report reduced frustration with communication breakdowns by implementing aphasia compensation outside [place] room.    Baseline 02/24/22: frustrated 3 times per day on average    Time 12    Period Weeks    Status New    Target Date 03/08/22      Additional Long Term Goals   Additional Long Term Goals Yes      SLP LONG TERM GOAL #6   Title Pt will use intelligibility strategies (loudness, breath support, overarticulation) 80% accuracy in 10 minutes conversation with modified independence.    Time 12    Period Weeks    Status New    Target Date 05/25/22              Plan    Clinical Impression Statement Pt's aphasia appearing more mild-moderate in recent sessions, and dysarthria/vocal quality/intelligibility significantly improved with use of compensations, for which pt requires min-moderate cues in spontaneous conversation. Frustration tolerance improved today. Benefits  from supportive techniques for reading comprehension; requires mod cues for multiparagraph reading comprehension today. Pt is progressing toward goals and benefits from  continued skilled ST intervention per plan of care. Recertification completed today; goals as stated above remain appropriate.    Speech Therapy Frequency 2x / week    Duration 2 weeks    Treatment/Interventions SLP instruction and feedback;Multimodal communcation approach;Patient/family education;Functional tasks;Cueing hierarchy;Compensatory techniques;Compensatory strategies    Potential to Achieve Goals Good    Potential Considerations Family/community support    SLP Home Exercise Plan TalkPath Therapy    Consulted and Agree with Plan of Care Patient              Deneise Lever, MS, CCC-SLP Speech-Language Pathologist ([place], [place] 03/24/2022, 2:06 PM

## 2022-03-24 NOTE — Therapy (Signed)
OUTPATIENT PHYSICAL THERAPY TREATMENT NOTE  Patient Name: Cassidy Hernandez MRN: [id number] DOB:30-Aug-1940, 81 y.o., female Today's Date: 03/24/2022  PCP: Kirk Ruths, MD REFERRING PROVIDER: Rayann Heman, NP   PT End of Session - 03/24/22 1615     Visit Number 24    Number of Visits 37    Date for PT Re-Evaluation 03/15/22    Authorization Type BCBS Medicare    Authorization Time Period 12/21/21-03/15/22    Progress Note Due on Visit 10    PT Start Time 1521    PT Stop Time 1602    PT Time Calculation (min) 41 min    Equipment Utilized During Treatment Gait belt    Activity Tolerance Patient tolerated treatment well;No increased pain    Behavior During Therapy [facility] for tasks assessed/performed                  Past Medical History:  Diagnosis Date   Anginal pain ([place])    Aortic atherosclerosis    Cancer ([place])    Carotid artery stenosis 02/01/2010   a.) Doppler 62/37/6283: 15% LICA and 17% RICA. b.) Doppler 61/60/7371: >06% LICA and 26% RICA. c.) Doppler 10/25/16: >70% Bilater ICAs   Chronic bilateral low back pain with left-sided sciatica    CKD (chronic kidney disease), stage III (HCC)    Coronary artery disease    a.) LHC 12/24/2014 -> small LAD system; diffuse CTO of LAD. 60% RCA. LM and LCx with no sig disease. no intervention; med mgmt.   Degenerative arthritis    Diastolic dysfunction    a.) TTE 09/20/2018: EF 55%, G1DD, mild LAE and RVE, triv-mild panvalvular regurgitation.   Dysarthria    GERD (gastroesophageal reflux disease)    Hepatic steatosis    History of kidney stones    HLD (hyperlipidemia)    Hypertension    Kidney stone    Lumbar radiculopathy    Major depression in remission ([place])    PVD (peripheral vascular disease) ([place])    Renal cyst, right 06/02/2015   a.) CT 06/02/2015 --> 4.4 cm RIGHT renal mass; MRI recommended. b.) MRI 06/16/2015 --> 3.8 x 4.2 x 4.0 cm proteinaceous/hemmorhagic cyst.   Subclavian steal syndrome    a.) LHC  12/24/2014 --> tight eccentric 90% ostial stenosis with damping.   T2DM (type 2 diabetes mellitus) [facility])    Past Surgical History:  Procedure Laterality Date   ABDOMINAL HYSTERECTOMY     AUGMENTATION MAMMAPLASTY Bilateral 1980   BACK SURGERY  09/2018   BICEPT TENODESIS  06/10/2021   Procedure: BICEPS TENODESIS;  Surgeon: Corky Mull, MD;  Location: [facility];  Service: Orthopedics;;   CARDIAC CATHETERIZATION Left 12/24/2014   Procedure: LEFT HEARD CATHETERIZATION; Location: [facility]; Surgeon: Laurence Aly, MD   CHOLECYSTECTOMY     COLONOSCOPY N/A 02/11/2016   Procedure: COLONOSCOPY;  Surgeon: Manya Silvas, MD;  Location: [facility] ENDOSCOPY;  Service: Endoscopy;  Laterality: N/A;   IR CT HEAD LTD  11/16/2021   IR CT HEAD LTD  11/16/2021   IR INTRAVSC STENT CERV CAROTID W/O EMB-PROT MOD SED INC ANGIO  11/18/2021   IR PERCUTANEOUS ART THROMBECTOMY/INFUSION INTRACRANIAL INC DIAG ANGIO  11/16/2021   RADIOLOGY WITH ANESTHESIA N/A 11/16/2021   Procedure: IR WITH ANESTHESIA- CODE STROKE;  Surgeon: Radiologist, Medication, MD;  Location: [place];  Service: Radiology;  Laterality: N/A;   REVERSE SHOULDER ARTHROPLASTY Right 06/10/2021   Procedure: REVERSE SHOULDER ARTHROPLASTY;  Surgeon: Corky Mull, MD;  Location: [facility];  Service: Orthopedics;  Laterality: Right;   Patient Active Problem List   Diagnosis Date Noted   Benign hypertension with CKD (chronic kidney disease) stage III ([place]) 03/02/2022   CAD (coronary artery disease) 03/02/2022   Hypercholesteremia 03/02/2022   Right hemiparesis ([place]) 12/16/2021   Acute ischemic left MCA stroke ([place]) 11/16/2021   Middle cerebral artery embolism, 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   Status post reverse arthroplasty of shoulder, right 06/10/2021   Traumatic complete tear of right rotator cuff 02/26/2021   Major depression in remission ([place]) 06/25/2020   Dysarthria 09/20/2018   Gastroesophageal reflux disease without esophagitis 09/20/2018   Lumbar radiculopathy 09/20/2018    Healthcare maintenance 05/02/2018   Controlled type 2 diabetes mellitus with stage 3 chronic kidney disease, without long-term current use of insulin ([place]) 01/30/2018   Chronic bilateral low back pain with left-sided sciatica 12/07/2016   Hepatic steatosis 11/23/2015   Carotid artery disease ([place]) 01/06/2010    REFERRING DIAG: D63.875 (ICD-10-CM) - Cerebral infarction due to unspecified occlusion or stenosis of left middle cerebral artery  THERAPY DIAG:  Muscle weakness (generalized)  Pain in right foot  Unsteadiness on feet  Difficulty in walking, not elsewhere classified  Rationale for Evaluation and Treatment Rehabilitation  PERTINENT HISTORY: Pt Recent L MCA, s/p thrombectomy and ICA stenting. Pt with speech deficits. Pt has been somewhat unsteady with a few falls falling backward however DTR reports this is not new, but has been ongoing. Pt seen at our Sports clinic in 2018 for low back pain issues in the setting of spinal stenosis.  PRECAUTIONS: N/A  SUBJECTIVE:  Pt continues to have R foot pain, wakes her up at night, worse with walking. No stumbles or falls. No other concerns except for balance, continues to monitor her BP in journal.   PAIN:  Are you having pain? R foot pain, not rated, worse with walking   TODAY'S TREATMENT:   Therapeutic Exercises:  -Pre exercise BP assessment, seated: 175/89 mmHg, 67 bpm. No symptoms  Seated march 20x each LE. Pt rates easy  LAQ 20x each LE with 3 sec holds. DF 20x BLE   BP following above seated therex, LUE, seated: 167/79 mmHg HR 68 bpm. No symptoms  Seated hip adduction 2x12x 3 sec holds. Cuing for increased RLE involvement. States exercise is not too bad when asked about level of challenge  Seated GTB hip abduction 2x20 B  Seated GTB hamstring curls 2x10 each LE  PT recommends to pt she follow-up with her physician regarding ongoing R foot pain   Neuromuscular Re-Ed: -Seated on dynadisc   EO 30 sec no UE  support   EO with trunk twists - one instance of requiring UE support  EC 60 sec no UE support, but rates challenging  EO march 20x 2 sets alt LE. Rates hard. Intermittent UE support required  EO 2x30 sec with second dynadisc placed under BLE; intermittent UE support required   PATIENT EDUCATION: Education details: exercise technique, body mechanics Person educated: Patient Education method: Explanation, Demonstration, Tactile cues, and Verbal cues Education comprehension: verbalized understanding, returned demonstration, verbal cues required, tactile cues required, and needs further education   HOME EXERCISE PROGRAM:  *see prior notes     PT Short Term Goals       PT SHORT TERM GOAL #1   Title Independence in HEP for improved balance and strength    Baseline Issued at eval  8/3: Pt reports she is performing her HEP every other day, not performing all her exercises  Time 3    Period Weeks    Status On-going    Target Date 04/11/2022       PT SHORT TERM GOAL #2   Title 5xSTS hands free <19sec in order to indicate decreased fall risk   Baseline Eval: >24sec hands free; 8/3:  32 sec hands free, CGA   Time 4    Period Weeks    Status Revised    Target Date 04/11/2022       PT SHORT TERM GOAL #3   Title The pt will demonstrate Ankle DF MMT 5/5 bilat for improved gait mechanics and functional mobility   Baseline Eval: 4+/5 Right, 4/5 Left; 8/3: 4+/5 B   Time 4    Period Weeks    Status Revised     Target Date 04/11/2022               PT Long Term Goals       PT LONG TERM GOAL #1   Title Pt will ambulate at least 1000 ft on the 6MWT for progression to community ambulator and improve gait ability   Baseline July 2019: 1350f; 03/10/22: 580 ft with 4WW   Time 8    Period Weeks    Status Revised    Target Date 05/09/2022       PT LONG TERM GOAL #2   Title Pt will complete 5xSTS <13 sec hands free to indicate decreased fall risk and increased BLE power.    Baseline eval: >24sec hands free; 8/3: 32 sec hands free, CGA   Time 8    Period Weeks    Status Revised    Target Date 05/09/2022       PT LONG TERM GOAL #3   Title Pt to demonstrate retroAMB without device >0.266m s LOB to demonstrate improved ankle righting and control.    Baseline Eval: <0.0180mand c 3 LOB 03/10/22: .08 m/s with 4WW    Time 10    Period Weeks    Status Partially Met   Target Date 06/06/2022      PT LONG TERM GOAL #4   Title The pt FOTO Score will increase by >15 points to indicate improved functional mobility and QOL    Baseline eval: 48; 03/10/2022: 61% (previously 62% on 02/02/22)    Time 12    Period Weeks    Status Achieved (revised to indicate functional purpose of goal)    Target Date              Plan     Clinical Impression Statement Interventions modified to seated today due to ongoing R foot pain. Pt tolerated all interventions well without pain, BP was decreased following exercise (starting 175/89>167/79 mmHg) and with no symptoms. Pt required intermittent UE support with majority of seated balance interventions, and rated marching on dynadisc as fatiguing. Pt will continue to benefit from skilled OPPT services to address deficits and improve overall safety and independence with functional mobility.     Personal Factors and Comorbidities Past/Current Experience    Examination-Activity Limitations Dressing;Transfers;Bend;Locomotion Level;Stairs;Stand    Examination-Participation Restrictions Cleaning;Shop;Meal Prep;[place];Laundry    Stability/Clinical Decision Making Evolving/Moderate complexity    Rehab Potential Good    Clinical Impairments Affecting Rehab Potential (+) motivation, social support (-) age, chronicity of pain, fairly sedentary lifestyle    PT Frequency 2x / week    PT Duration 12 weeks    PT Treatment/Interventions Aquatic Therapy;Moist Heat;Functional mobility training;Dry needling;Neuromuscular  re-education;Traction;Ultrasound;Iontophoresis 4mg51m Dexamethasone;ADLs/Self Care  Home Management;Cryotherapy;Electrical Stimulation;Therapeutic exercise;Therapeutic activities;Patient/family education;Taping;Gait training;Stair training;DME Instruction;Balance training;Passive range of motion    PT Next Visit Plan Progress standing functional interventions as appropriate, updated HEP, assess hip mobility for potential restrictions   PT Home Exercise Plan Eval: seated heel raises, toe raises, and STS from elevated surface. 01/04/2022=Access Code: [email or web address]  URL: [email or web address]    Consulted and Agree with Plan of Care Patient;Family member/caregiver    Family Member Consulted [place] [facility], [place]  4:20 PM,03/24/22 Physical Therapist - [facility]

## 2022-03-28 ENCOUNTER — Ambulatory Visit: Payer: Medicare Other

## 2022-03-28 ENCOUNTER — Ambulatory Visit: Payer: Medicare Other | Admitting: Speech Pathology

## 2022-03-28 ENCOUNTER — Encounter: Payer: Medicare Other | Admitting: Occupational Therapy

## 2022-03-28 DIAGNOSIS — M6281 Muscle weakness (generalized): Secondary | ICD-10-CM

## 2022-03-28 DIAGNOSIS — R4701 Aphasia: Secondary | ICD-10-CM

## 2022-03-28 DIAGNOSIS — R2681 Unsteadiness on feet: Secondary | ICD-10-CM

## 2022-03-28 DIAGNOSIS — M79671 Pain in right foot: Secondary | ICD-10-CM

## 2022-03-28 DIAGNOSIS — R471 Dysarthria and anarthria: Secondary | ICD-10-CM

## 2022-03-28 DIAGNOSIS — R262 Difficulty in walking, not elsewhere classified: Secondary | ICD-10-CM

## 2022-03-28 NOTE — Therapy (Signed)
OUTPATIENT SPEECH LANGUAGE PATHOLOGY TREATMENT NOTE  Patient Name: Cassidy Hernandez MRN: [id number] DOB:10-18-1940, 81 y.o., female Today's Date: 03/28/2022   REFERRING PROVIDER: Merrilee Seashore, NP     End of Session - 03/28/22 1551     Visit Number 29    Number of Visits 28    Date for SLP Re-Evaluation 06/14/22    Authorization Type BCBS MCR; $10 copay    Authorization - Visit Number 9    Progress Note Due on Visit 10    SLP Start Time 1400    SLP Stop Time  1445    SLP Time Calculation (min) 45 min    Activity Tolerance Patient tolerated treatment well                 Past Medical History:  Diagnosis Date   Anginal pain ([place])    Aortic atherosclerosis    Cancer ([place])    Carotid artery stenosis 02/01/2010   a.) Doppler 89/37/3428: 76% LICA and 81% RICA. b.) Doppler 15/72/6203: >55% LICA and 97% RICA. c.) Doppler 10/25/16: >70% Bilater ICAs   Chronic bilateral low back pain with left-sided sciatica    CKD (chronic kidney disease), stage III (HCC)    Coronary artery disease    a.) LHC 12/24/2014 -> small LAD system; diffuse CTO of LAD. 60% RCA. LM and LCx with no sig disease. no intervention; med mgmt.   Degenerative arthritis    Diastolic dysfunction    a.) TTE 09/20/2018: EF 55%, G1DD, mild LAE and RVE, triv-mild panvalvular regurgitation.   Dysarthria    GERD (gastroesophageal reflux disease)    Hepatic steatosis    History of kidney stones    HLD (hyperlipidemia)    Hypertension    Kidney stone    Lumbar radiculopathy    Major depression in remission ([place])    PVD (peripheral vascular disease) ([place])    Renal cyst, right 06/02/2015   a.) CT 06/02/2015 --> 4.4 cm RIGHT renal mass; MRI recommended. b.) MRI 06/16/2015 --> 3.8 x 4.2 x 4.0 cm proteinaceous/hemmorhagic cyst.   Subclavian steal syndrome    a.) LHC 12/24/2014 --> tight eccentric 90% ostial stenosis with damping.   T2DM (type 2 diabetes mellitus) [facility])    Past Surgical History:  Procedure Laterality  Date   ABDOMINAL HYSTERECTOMY     AUGMENTATION MAMMAPLASTY Bilateral 1980   BACK SURGERY  09/2018   BICEPT TENODESIS  06/10/2021   Procedure: BICEPS TENODESIS;  Surgeon: Corky Mull, MD;  Location: [facility];  Service: Orthopedics;;   CARDIAC CATHETERIZATION Left 12/24/2014   Procedure: LEFT HEARD CATHETERIZATION; Location: [facility]; Surgeon: Laurence Aly, MD   CHOLECYSTECTOMY     COLONOSCOPY N/A 02/11/2016   Procedure: COLONOSCOPY;  Surgeon: Manya Silvas, MD;  Location: [facility] ENDOSCOPY;  Service: Endoscopy;  Laterality: N/A;   IR CT HEAD LTD  11/16/2021   IR CT HEAD LTD  11/16/2021   IR INTRAVSC STENT CERV CAROTID W/O EMB-PROT MOD SED INC ANGIO  11/18/2021   IR PERCUTANEOUS ART THROMBECTOMY/INFUSION INTRACRANIAL INC DIAG ANGIO  11/16/2021   RADIOLOGY WITH ANESTHESIA N/A 11/16/2021   Procedure: IR WITH ANESTHESIA- CODE STROKE;  Surgeon: Radiologist, Medication, MD;  Location: [place];  Service: Radiology;  Laterality: N/A;   REVERSE SHOULDER ARTHROPLASTY Right 06/10/2021   Procedure: REVERSE SHOULDER ARTHROPLASTY;  Surgeon: Corky Mull, MD;  Location: [facility];  Service: Orthopedics;  Laterality: Right;   Patient Active Problem List   Diagnosis Date Noted   Benign hypertension with  CKD (chronic kidney disease) stage III ([place]) 03/02/2022   CAD (coronary artery disease) 03/02/2022   Hypercholesteremia 03/02/2022   Right hemiparesis ([place]) 12/16/2021   Acute ischemic left MCA stroke ([place]) 11/16/2021   Middle cerebral artery embolism, 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   Status post reverse arthroplasty of shoulder, right 06/10/2021   Traumatic complete tear of right rotator cuff 02/26/2021   Major depression in remission ([place]) 06/25/2020   Dysarthria 09/20/2018   Gastroesophageal reflux disease without esophagitis 09/20/2018   Lumbar radiculopathy 09/20/2018   Healthcare maintenance 05/02/2018   Controlled type 2 diabetes mellitus with stage 3 chronic kidney disease, without long-term current use of insulin ([place])  01/30/2018   Chronic bilateral low back pain with left-sided sciatica 12/07/2016   Hepatic steatosis 11/23/2015   Carotid artery disease ([place]) 01/06/2010    ONSET DATE: 11/16/21   REFERRING DIAG: L MCA CVA   HPI: Pt is a 81 y.o. 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 Patient is s/p thrombectomy with left ICA stent placement 11/16/21. She completed inpatient rehabilitation at [facility] 11/25/21-12/07/21 and was discharged home with family. Patient known to this clinic from prior course of voice therapy in 2018. Pt with documented hx of voice and speech changes (dysarthria) per videostroboscopy 12/26/17; did not attend therapy.   THERAPY DIAG:  Aphasia  Dysarthria and anarthria  Rationale for Evaluation and Treatment Rehabilitation  SUBJECTIVE: Pt pleasant, knew to this writer but pt was friendly, asked appropriate questions  Pt accompanied by: Elvin So, caregiver  PAIN: No Are you having pain? Yes: NPRS scale: 7/10 Pain location: right foot     OBJECTIVE:   TODAY'S TREATMENT: Skilled treatment session focused on pt's speech intelligibility goals. SLP facilitated session by providing rare min A for over-articulation of multi-syllabic words and phrases (tongue twisters) to achieve 85% speech intelligibility. SLP further facilitated session by providing moderate verbal cues when reading basic paragraph on [place]. Specifically, pt benefited from imitation of verbal model and repetition at the sentence level for 4 of 7 sentences to achieve >50% speech intelligibility for each sentence.    PATIENT EDUCATION: Education details: homework tasks Person educated: Patient Education method: Education officer, environmental, and Verbal cues Education comprehension: verbalized understanding and needs further education    SLP Short Term Goals       SLP SHORT TERM GOAL #1   Title Pt will communicate emergency information 100% accuracy using visual aid  if necessary.    Baseline 100% 01/24/22    Time 6    Period Weeks    Status Achieved    Target Date 01/27/22      SLP SHORT TERM GOAL #2   Title Pt will generate at least 4 descriptors of target word 80% of the time using semantic feature analysis to improve abilities in wordfinding and resolving communication breakdowns.    Baseline 85% 01/24/22    Time 6    Period Weeks    Status Achieved   Target Date 01/27/22      SLP SHORT TERM GOAL #3   Title Patient will request repetition/rephrasing to aid auditory comprehension of mod complex information (multi-step commands, conversation) in 80% of opportunities.    Baseline 85% 01/24/22    Time 6    Period Weeks    Status Achieved    Target Date 01/27/22      SLP SHORT TERM GOAL #4   Title Pt will read and reply to simple texts/emails 80% accuracy with use  of accessibility features as necessary.    Baseline 01/24/22, extended time, requires occasional cues, continues to require cuing with accessibility features    Time 6    Period Weeks    Status Partially Met    Target Date 01/27/22      SLP SHORT TERM GOAL #5   Title Pt will write sentences of 5-7 words 90% accuracy (self correction allowed) with occasional min cues.    Time 10    Period --   sessions   Status New      Additional Short Term Goals   Additional Short Term Goals Yes      SLP SHORT TERM GOAL #6   Title Pt will use intelligibility strategies (loudness, breath support, overarticulation) for sentence level responses 90% accuracy with min cues.    Time 10    Period --   sessions   Status New      SLP SHORT TERM GOAL #7   Title Pt will use aphasia compensations in 10 minutes moderately complex conversation with fewer than 3 listener questions necessary for clarification of message.    Time 10    Period --   sessions   Status New              SLP Long Term Goals       SLP LONG TERM GOAL #1   Title Pt will engage in 5-8 minutes simple-mod complex  conversation re: topic of interest with supported conversation, aphasia compensations.    Time 12    Period Weeks    Status Achieved      SLP LONG TERM GOAL #2   Title Pt will ID and attempt repair of communication breakdowns >80% of the time in session.    Time 12    Period Weeks    Status Achieved      SLP LONG TERM GOAL #3   Title Pt will demonstrate reading comprehension of mod complex short story or articles of interest with technology supports (text-to-speech, ereader, etc) by generating summary or answering questions >80% accuracy.    Baseline 02/23/22: Reads/comprehends simple paragraphs unassisted.    Time 12    Period Weeks    Status Revised   and renewed for 12 weeks   Target Date 03/08/22      SLP LONG TERM GOAL #4   Title Pt will self-advocate for communication needs in community or family outings (per her report) to increase confidence in social interactions.    Time 12    Period Weeks    Status New    Target Date 03/08/22      SLP LONG TERM GOAL #5   Title Pt will report reduced frustration with communication breakdowns by implementing aphasia compensation outside [place] room.    Baseline 02/24/22: frustrated 3 times per day on average    Time 12    Period Weeks    Status New    Target Date 03/08/22      Additional Long Term Goals   Additional Long Term Goals Yes      SLP LONG TERM GOAL #6   Title Pt will use intelligibility strategies (loudness, breath support, overarticulation) 80% accuracy in 10 minutes conversation with modified independence.    Time 12    Period Weeks    Status New    Target Date 05/25/22              Plan    Clinical Impression Statement Pt's aphasia appearing more mild-moderate in  recent sessions, and dysarthria/vocal quality/intelligibility significantly improved with use of compensations, for which pt requires min-moderate cues in spontaneous conversation. Frustration tolerance improved today. Benefits from supportive techniques  for reading comprehension; requires mod cues for multiparagraph reading comprehension today. Pt is progressing toward goals and benefits from continued skilled ST intervention per plan of care. Recertification completed today; goals as stated above remain appropriate.    Speech Therapy Frequency 2x / week    Duration 2 weeks    Treatment/Interventions SLP instruction and feedback;Multimodal communcation approach;Patient/family education;Functional tasks;Cueing hierarchy;Compensatory techniques;Compensatory strategies    Potential to Achieve Goals Good    Potential Considerations Family/community support    SLP Home Exercise Plan TalkPath Therapy    Consulted and Agree with Plan of Care Patient            Janel Beane B. Rutherford Nail, M.S., CCC-SLP, Mining engineer Certified Brain Injury [place]  [place] Office [phone] Ascom [phone] Fax [phone]

## 2022-03-28 NOTE — Therapy (Addendum)
OUTPATIENT PHYSICAL THERAPY TREATMENT NOTE  Patient Name: Cassidy Hernandez MRN: [id number] DOB:1941/04/02, 81 y.o., female Today's Date: 03/28/2022  PCP: Kirk Ruths, MD REFERRING PROVIDER: Rayann Heman, NP   PT End of Session - 03/28/22 1440     Visit Number 25    Number of Visits 37    Date for PT Re-Evaluation 05/09/22    Authorization Type BCBS Medicare    Authorization Time Period 12/21/21-03/15/22    Progress Note Due on Visit 10    PT Start Time 1500    PT Stop Time 1545    PT Time Calculation (min) 45 min    Equipment Utilized During Treatment Gait belt    Activity Tolerance Patient tolerated treatment well;No increased pain    Behavior During Therapy [facility] for tasks assessed/performed                   Past Medical History:  Diagnosis Date   Anginal pain ([place])    Aortic atherosclerosis    Cancer ([place])    Carotid artery stenosis 02/01/2010   a.) Doppler 20/80/2233: 61% LICA and 22% RICA. b.) Doppler 44/97/5300: >51% LICA and 10% RICA. c.) Doppler 10/25/16: >70% Bilater ICAs   Chronic bilateral low back pain with left-sided sciatica    CKD (chronic kidney disease), stage III (HCC)    Coronary artery disease    a.) LHC 12/24/2014 -> small LAD system; diffuse CTO of LAD. 60% RCA. LM and LCx with no sig disease. no intervention; med mgmt.   Degenerative arthritis    Diastolic dysfunction    a.) TTE 09/20/2018: EF 55%, G1DD, mild LAE and RVE, triv-mild panvalvular regurgitation.   Dysarthria    GERD (gastroesophageal reflux disease)    Hepatic steatosis    History of kidney stones    HLD (hyperlipidemia)    Hypertension    Kidney stone    Lumbar radiculopathy    Major depression in remission ([place])    PVD (peripheral vascular disease) ([place])    Renal cyst, right 06/02/2015   a.) CT 06/02/2015 --> 4.4 cm RIGHT renal mass; MRI recommended. b.) MRI 06/16/2015 --> 3.8 x 4.2 x 4.0 cm proteinaceous/hemmorhagic cyst.   Subclavian steal syndrome    a.) LHC  12/24/2014 --> tight eccentric 90% ostial stenosis with damping.   T2DM (type 2 diabetes mellitus) [facility])    Past Surgical History:  Procedure Laterality Date   ABDOMINAL HYSTERECTOMY     AUGMENTATION MAMMAPLASTY Bilateral 1980   BACK SURGERY  09/2018   BICEPT TENODESIS  06/10/2021   Procedure: BICEPS TENODESIS;  Surgeon: Corky Mull, MD;  Location: [facility];  Service: Orthopedics;;   CARDIAC CATHETERIZATION Left 12/24/2014   Procedure: LEFT HEARD CATHETERIZATION; Location: [facility]; Surgeon: Laurence Aly, MD   CHOLECYSTECTOMY     COLONOSCOPY N/A 02/11/2016   Procedure: COLONOSCOPY;  Surgeon: Manya Silvas, MD;  Location: [facility] ENDOSCOPY;  Service: Endoscopy;  Laterality: N/A;   IR CT HEAD LTD  11/16/2021   IR CT HEAD LTD  11/16/2021   IR INTRAVSC STENT CERV CAROTID W/O EMB-PROT MOD SED INC ANGIO  11/18/2021   IR PERCUTANEOUS ART THROMBECTOMY/INFUSION INTRACRANIAL INC DIAG ANGIO  11/16/2021   RADIOLOGY WITH ANESTHESIA N/A 11/16/2021   Procedure: IR WITH ANESTHESIA- CODE STROKE;  Surgeon: Radiologist, Medication, MD;  Location: [place];  Service: Radiology;  Laterality: N/A;   REVERSE SHOULDER ARTHROPLASTY Right 06/10/2021   Procedure: REVERSE SHOULDER ARTHROPLASTY;  Surgeon: Corky Mull, MD;  Location: [facility];  Service: Orthopedics;  Laterality: Right;   Patient Active Problem List   Diagnosis Date Noted   Benign hypertension with CKD (chronic kidney disease) stage III ([place]) 03/02/2022   CAD (coronary artery disease) 03/02/2022   Hypercholesteremia 03/02/2022   Right hemiparesis ([place]) 12/16/2021   Acute ischemic left MCA stroke ([place]) 11/16/2021   Middle cerebral artery embolism, 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   Status post reverse arthroplasty of shoulder, right 06/10/2021   Traumatic complete tear of right rotator cuff 02/26/2021   Major depression in remission ([place]) 06/25/2020   Dysarthria 09/20/2018   Gastroesophageal reflux disease without esophagitis 09/20/2018   Lumbar radiculopathy 09/20/2018    Healthcare maintenance 05/02/2018   Controlled type 2 diabetes mellitus with stage 3 chronic kidney disease, without long-term current use of insulin ([place]) 01/30/2018   Chronic bilateral low back pain with left-sided sciatica 12/07/2016   Hepatic steatosis 11/23/2015   Carotid artery disease ([place]) 01/06/2010    REFERRING DIAG: F35.456 (ICD-10-CM) - Cerebral infarction due to unspecified occlusion or stenosis of left middle cerebral artery  THERAPY DIAG:  Pain in right foot  Unsteadiness on feet  Difficulty in walking, not elsewhere classified  Muscle weakness (generalized)  Rationale for Evaluation and Treatment Rehabilitation  PERTINENT HISTORY: Pt Recent L MCA, s/p thrombectomy and ICA stenting. Pt with speech deficits. Pt has been somewhat unsteady with a few falls falling backward however DTR reports this is not new, but has been ongoing. Pt seen at our Sports clinic in 2018 for low back pain issues in the setting of spinal stenosis.  PRECAUTIONS: N/A  SUBJECTIVE:  Patient reports her foot continues to bother her. Patient has not made an appointment with the physician yet. Patient reports she did fall a few weeks ago at the kitchen counter and turned and fell.    PAIN:  Are you having pain? R foot pain, not rated, worse with walking   TODAY'S TREATMENT:   Therapeutic Exercises:  10x STS Seated #2.5 ankle weight: -LAQ 15x each LE -march 15x each LE -lateral step 15x each side  -heel raise 15x  PT recommends to pt she follow-up with her physician regarding ongoing R foot pain   Neuromuscular Re-Ed: Standing with CGA next to support surface:  Airex pad: static stand 30 seconds x 2 trials, noticeable trembling of ankles/LE's with fatigue and challenge to maintain stability Airex pad: horizontal head turns 30 seconds scanning room 10x ; cueing for arc of motion  Airex pad: vertical head turns 30 seconds, cueing for arc of motion, noticeable sway with upward gaze  increasing demand on ankle righting reaction musculature Airex pad: one foot on 6" step one foot on airex pad, hold position for 30 seconds, switch legs, 2x each LE; Lateral step over orange hurdle 10x each LE   Walk with cane with CGA. Two near LOB. 60 ft  Weave between 6 cones  with RW x4 trials    PATIENT EDUCATION: Education details: exercise technique, body mechanics Person educated: Patient Education method: Explanation, Demonstration, Tactile cues, and Verbal cues Education comprehension: verbalized understanding, returned demonstration, verbal cues required, tactile cues required, and needs further education   HOME EXERCISE PROGRAM:  *see prior notes     PT Short Term Goals       PT SHORT TERM GOAL #1   Title Independence in HEP for improved balance and strength    Baseline Issued at eval  8/3: Pt reports she is performing her HEP every other day, not performing all her exercises  Time 3    Period Weeks    Status On-going    Target Date 04/11/2022       PT SHORT TERM GOAL #2   Title 5xSTS hands free <19sec in order to indicate decreased fall risk   Baseline Eval: >24sec hands free; 8/3:  32 sec hands free, CGA   Time 4    Period Weeks    Status Revised    Target Date 04/11/2022       PT SHORT TERM GOAL #3   Title The pt will demonstrate Ankle DF MMT 5/5 bilat for improved gait mechanics and functional mobility   Baseline Eval: 4+/5 Right, 4/5 Left; 8/3: 4+/5 B   Time 4    Period Weeks    Status Revised     Target Date 04/11/2022               PT Long Term Goals       PT LONG TERM GOAL #1   Title Pt will ambulate at least 1000 ft on the 6MWT for progression to community ambulator and improve gait ability   Baseline July 2019: 1327f; 03/10/22: 580 ft with 4WW   Time 8    Period Weeks    Status Revised    Target Date 05/09/2022       PT LONG TERM GOAL #2   Title Pt will complete 5xSTS <13 sec hands free to indicate decreased fall risk and  increased BLE power.   Baseline eval: >24sec hands free; 8/3: 32 sec hands free, CGA   Time 8    Period Weeks    Status Revised    Target Date 05/09/2022       PT LONG TERM GOAL #3   Title Pt to demonstrate retroAMB without device >0.264m s LOB to demonstrate improved ankle righting and control.    Baseline Eval: <0.0132mand c 3 LOB 03/10/22: .08 m/s with 4WW    Time 10    Period Weeks    Status Partially Met   Target Date 06/06/2022      PT LONG TERM GOAL #4   Title The pt FOTO Score will increase by >15 points to indicate improved functional mobility and QOL    Baseline eval: 48; 03/10/2022: 61% (previously 62% on 02/02/22)    Time 12    Period Weeks    Status Achieved (revised to indicate functional purpose of goal)    Target Date              Plan     Clinical Impression Statement Patient is able to tolerate standing interventions this session. Is encouraged to follow up with physician for foot pain. She is able to weave between cones with use of RW but is very limtied with ambulation with SPC. Pt will continue to benefit from skilled OPPT services to address deficits and improve overall safety and independence with functional mobility.     Personal Factors and Comorbidities Past/Current Experience    Examination-Activity Limitations Dressing;Transfers;Bend;Locomotion Level;Stairs;Stand    Examination-Participation Restrictions Cleaning;Shop;Meal Prep;[place];Laundry    Stability/Clinical Decision Making Evolving/Moderate complexity    Rehab Potential Good    Clinical Impairments Affecting Rehab Potential (+) motivation, social support (-) age, chronicity of pain, fairly sedentary lifestyle    PT Frequency 2x / week    PT Duration 12 weeks    PT Treatment/Interventions Aquatic Therapy;Moist Heat;Functional mobility training;Dry needling;Neuromuscular re-education;Traction;Ultrasound;Iontophoresis 4mg64m Dexamethasone;ADLs/Self Care Home Management;Cryotherapy;Electrical  Stimulation;Therapeutic exercise;Therapeutic activities;Patient/family education;Taping;Gait training;Stair training;DME Instruction;Balance training;Passive  range of motion    PT Next Visit Plan Progress standing functional interventions as appropriate, updated HEP, assess hip mobility for potential restrictions   PT Home Exercise Plan Eval: seated heel raises, toe raises, and STS from elevated surface. 01/04/2022=Access Code: [email or web address]  URL: [email or web address]    Consulted and Agree with Plan of Care Patient;Family member/caregiver    Family Member Consulted Eino Farber PT   3:46 PM,03/28/22 Physical Therapist - [facility]

## 2022-03-30 ENCOUNTER — Ambulatory Visit: Payer: Medicare Other

## 2022-03-30 ENCOUNTER — Ambulatory Visit: Payer: Medicare Other | Admitting: Speech Pathology

## 2022-03-30 DIAGNOSIS — R4701 Aphasia: Secondary | ICD-10-CM | POA: Diagnosis not present

## 2022-03-30 DIAGNOSIS — R2681 Unsteadiness on feet: Secondary | ICD-10-CM

## 2022-03-30 DIAGNOSIS — R262 Difficulty in walking, not elsewhere classified: Secondary | ICD-10-CM

## 2022-03-30 DIAGNOSIS — M79671 Pain in right foot: Secondary | ICD-10-CM

## 2022-03-30 DIAGNOSIS — M6281 Muscle weakness (generalized): Secondary | ICD-10-CM

## 2022-03-30 NOTE — Therapy (Signed)
OUTPATIENT PHYSICAL THERAPY TREATMENT NOTE  Patient Name: Cassidy Hernandez MRN: [id number] DOB:08-22-1940, 81 y.o., female Today's Date: 03/31/2022  PCP: Kirk Ruths, MD REFERRING PROVIDER: Rayann Heman, NP   PT End of Session - 03/30/22 1312     Visit Number 26    Number of Visits 37    Date for PT Re-Evaluation 05/09/22    Authorization Type BCBS Medicare    Authorization Time Period 12/21/21-03/15/22    Progress Note Due on Visit 10    PT Start Time 1309    PT Stop Time 1344    PT Time Calculation (min) 35 min    Equipment Utilized During Treatment Gait belt    Activity Tolerance Patient tolerated treatment well;No increased pain    Behavior During Therapy [facility] for tasks assessed/performed                   Past Medical History:  Diagnosis Date   Anginal pain ([place])    Aortic atherosclerosis    Cancer ([place])    Carotid artery stenosis 02/01/2010   a.) Doppler 54/62/7035: 00% LICA and 93% RICA. b.) Doppler 81/82/9937: >16% LICA and 96% RICA. c.) Doppler 10/25/16: >70% Bilater ICAs   Chronic bilateral low back pain with left-sided sciatica    CKD (chronic kidney disease), stage III (HCC)    Coronary artery disease    a.) LHC 12/24/2014 -> small LAD system; diffuse CTO of LAD. 60% RCA. LM and LCx with no sig disease. no intervention; med mgmt.   Degenerative arthritis    Diastolic dysfunction    a.) TTE 09/20/2018: EF 55%, G1DD, mild LAE and RVE, triv-mild panvalvular regurgitation.   Dysarthria    GERD (gastroesophageal reflux disease)    Hepatic steatosis    History of kidney stones    HLD (hyperlipidemia)    Hypertension    Kidney stone    Lumbar radiculopathy    Major depression in remission ([place])    PVD (peripheral vascular disease) ([place])    Renal cyst, right 06/02/2015   a.) CT 06/02/2015 --> 4.4 cm RIGHT renal mass; MRI recommended. b.) MRI 06/16/2015 --> 3.8 x 4.2 x 4.0 cm proteinaceous/hemmorhagic cyst.   Subclavian steal syndrome    a.) LHC  12/24/2014 --> tight eccentric 90% ostial stenosis with damping.   T2DM (type 2 diabetes mellitus) [facility])    Past Surgical History:  Procedure Laterality Date   ABDOMINAL HYSTERECTOMY     AUGMENTATION MAMMAPLASTY Bilateral 1980   BACK SURGERY  09/2018   BICEPT TENODESIS  06/10/2021   Procedure: BICEPS TENODESIS;  Surgeon: Corky Mull, MD;  Location: [facility];  Service: Orthopedics;;   CARDIAC CATHETERIZATION Left 12/24/2014   Procedure: LEFT HEARD CATHETERIZATION; Location: [facility]; Surgeon: Laurence Aly, MD   CHOLECYSTECTOMY     COLONOSCOPY N/A 02/11/2016   Procedure: COLONOSCOPY;  Surgeon: Manya Silvas, MD;  Location: [facility] END[facility]COPY;  Service: Endoscopy;  Laterality: N/A;   IR CT HEAD LTD  11/16/2021   IR CT HEAD LTD  11/16/2021   IR INTRAVSC STENT CERV CAROTID W/O EMB-PROT MOD SED INC ANGIO  11/18/2021   IR PERCUTANEOUS ART THROMBECTOMY/INFUSION INTRACRANIAL INC DIAG ANGIO  11/16/2021   RADIOLOGY WITH ANESTHESIA N/A 11/16/2021   Procedure: IR WITH ANESTHESIA- CODE STROKE;  Surgeon: Radiologist, Medication, MD;  Location: [place];  Service: Radiology;  Laterality: N/A;   REVERSE SHOULDER ARTHROPLASTY Right 06/10/2021   Procedure: REVERSE SHOULDER ARTHROPLASTY;  Surgeon: Corky Mull, MD;  Location: [facility];  Service: Orthopedics;  Laterality: Right;   Patient Active Problem List   Diagnosis Date Noted   Benign hypertension with CKD (chronic kidney disease) stage III ([place]) 03/02/2022   CAD (coronary artery disease) 03/02/2022   Hypercholesteremia 03/02/2022   Right hemiparesis ([place]) 12/16/2021   Acute ischemic left MCA stroke ([place]) 11/16/2021   Middle cerebral artery embolism, 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   Status post reverse arthroplasty of shoulder, right 06/10/2021   Traumatic complete tear of right rotator cuff 02/26/2021   Major depression in remission ([place]) 06/25/2020   Dysarthria 09/20/2018   Gastroesophageal reflux disease without esophagitis 09/20/2018   Lumbar radiculopathy 09/20/2018    Healthcare maintenance 05/02/2018   Controlled type 2 diabetes mellitus with stage 3 chronic kidney disease, without long-term current use of insulin ([place]) 01/30/2018   Chronic bilateral low back pain with left-sided sciatica 12/07/2016   Hepatic steatosis 11/23/2015   Carotid artery disease ([place]) 01/06/2010    REFERRING DIAG: T65.465 (ICD-10-CM) - Cerebral infarction due to unspecified occlusion or stenosis of left middle cerebral artery  THERAPY DIAG:  Pain in right foot  Unsteadiness on feet  Difficulty in walking, not elsewhere classified  Muscle weakness (generalized)  Rationale for Evaluation and Treatment Rehabilitation  PERTINENT HISTORY: Pt Recent L MCA, s/p thrombectomy and ICA stenting. Pt with speech deficits. Pt has been somewhat unsteady with a few falls falling backward however DTR reports this is not new, but has been ongoing. Pt seen at our Sports clinic in 2018 for low back pain issues in the setting of spinal stenosis.  PRECAUTIONS: N/A  SUBJECTIVE:  Patient reports doing okay. States she did not walk well at all last visit with the cane and states she was disappointed in herself.   PAIN:  None reported today.  TODAY'S TREATMENT:   Therapeutic Exercises:  10x STS without UE support - VC to lean trunk forward- Patient was surprised in her ability with only VC today. Seated #2.5 ankle weight:  -LAQ 15x each LE  -march 15x each LE  -lateral step 15x each side   -heel raise 15x  Neuromuscular Re-Ed: Dynamic stepping onto 1st step with BUE Support x 10 reps each; 1 UE support x 10 reps Standing with CGA next to support surface:  Airex pad: one foot on 6" step one foot on airex pad, hold position for 30 seconds, switch legs, 2x each LE; Lateral step over orange hurdle 10x each LE   Walk with Hurrycane with CGA in clinic on firm surface at approx 200 feet- Mod VC for gait sequencing - presents with step to and some short reciprocal steps yet not  consistent.    PATIENT EDUCATION: Education details: exercise technique, body mechanics Person educated: Patient Education method: Explanation, Demonstration, Tactile cues, and Verbal cues Education comprehension: verbalized understanding, returned demonstration, verbal cues required, tactile cues required, and needs further education   HOME EXERCISE PROGRAM:  *see prior notes     PT Short Term Goals       PT SHORT TERM GOAL #1   Title Independence in HEP for improved balance and strength    Baseline Issued at eval  8/3: Pt reports she is performing her HEP every other day, not performing all her exercises   Time 3    Period Weeks    Status On-going    Target Date 04/11/2022       PT SHORT TERM GOAL #2   Title 5xSTS hands free <19sec in order to indicate decreased fall risk  Baseline Eval: >24sec hands free; 8/3:  32 sec hands free, CGA   Time 4    Period Weeks    Status Revised    Target Date 04/11/2022       PT SHORT TERM GOAL #3   Title The pt will demonstrate Ankle DF MMT 5/5 bilat for improved gait mechanics and functional mobility   Baseline Eval: 4+/5 Right, 4/5 Left; 8/3: 4+/5 B   Time 4    Period Weeks    Status Revised     Target Date 04/11/2022               PT Long Term Goals       PT LONG TERM GOAL #1   Title Pt will ambulate at least 1000 ft on the 6MWT for progression to community ambulator and improve gait ability   Baseline July 2019: 1328f; 03/10/22: 580 ft with 4WW   Time 8    Period Weeks    Status Revised    Target Date 05/09/2022       PT LONG TERM GOAL #2   Title Pt will complete 5xSTS <13 sec hands free to indicate decreased fall risk and increased BLE power.   Baseline eval: >24sec hands free; 8/3: 32 sec hands free, CGA   Time 8    Period Weeks    Status Revised    Target Date 05/09/2022       PT LONG TERM GOAL #3   Title Pt to demonstrate retroAMB without device >0.216m s LOB to demonstrate improved ankle righting and  control.    Baseline Eval: <0.0120mand c 3 LOB 03/10/22: .08 m/s with 4WW    Time 10    Period Weeks    Status Partially Met   Target Date 06/06/2022      PT LONG TERM GOAL #4   Title The pt FOTO Score will increase by >15 points to indicate improved functional mobility and QOL    Baseline eval: 48; 03/10/2022: 61% (previously 62% on 02/02/22)    Time 12    Period Weeks    Status Achieved (revised to indicate functional purpose of goal)    Target Date              Plan     Clinical Impression Statement Treatment focused on gait training with use of hurrycane today. Patient initially struggled with gait sequencing but vasty improved with VC and visual demo along with practice. Pt will continue to benefit from skilled OPPT services to address deficits and improve overall safety and independence with functional mobility.     Personal Factors and Comorbidities Past/Current Experience    Examination-Activity Limitations Dressing;Transfers;Bend;Locomotion Level;Stairs;Stand    Examination-Participation Restrictions Cleaning;Shop;Meal Prep;[place];Laundry    Stability/Clinical Decision Making Evolving/Moderate complexity    Rehab Potential Good    Clinical Impairments Affecting Rehab Potential (+) motivation, social support (-) age, chronicity of pain, fairly sedentary lifestyle    PT Frequency 2x / week    PT Duration 12 weeks    PT Treatment/Interventions Aquatic Therapy;Moist Heat;Functional mobility training;Dry needling;Neuromuscular re-education;Traction;Ultrasound;Iontophoresis 4mg42m Dexamethasone;ADLs/Self Care Home Management;Cryotherapy;Electrical Stimulation;Therapeutic exercise;Therapeutic activities;Patient/family education;Taping;Gait training;Stair training;DME Instruction;Balance training;Passive range of motion    PT Next Visit Plan Progress standing functional interventions as appropriate, updated HEP, assess hip mobility for potential restrictions   PT Home Exercise  Plan Eval: seated heel raises, toe raises, and STS from elevated surface. 01/04/2022=Access Code: [email or web address]: [email or web address]    Consulted and Agree with Plan of  Care Patient;Family member/caregiver    Family Member Consulted Robbie Louis PT   11:38 AM,03/31/22 Physical Therapist - [facility]

## 2022-04-01 ENCOUNTER — Ambulatory Visit
Admission: RE | Admit: 2022-04-01 | Discharge: 2022-04-01 | Disposition: A | Discharge status: Home / Self Care | Payer: Medicare Other | Source: Ambulatory Visit | Attending: Internal Medicine | Admitting: Internal Medicine

## 2022-04-01 ENCOUNTER — Other Ambulatory Visit: Payer: Self-pay | Admitting: Internal Medicine

## 2022-04-01 DIAGNOSIS — Z1231 Encounter for screening mammogram for malignant neoplasm of breast: Secondary | ICD-10-CM | POA: Insufficient documentation

## 2022-04-04 ENCOUNTER — Encounter: Payer: Medicare Other | Admitting: Occupational Therapy

## 2022-04-04 ENCOUNTER — Ambulatory Visit: Payer: Medicare Other

## 2022-04-04 ENCOUNTER — Ambulatory Visit: Payer: Medicare Other | Admitting: Speech Pathology

## 2022-04-04 DIAGNOSIS — M79671 Pain in right foot: Secondary | ICD-10-CM

## 2022-04-04 DIAGNOSIS — R2681 Unsteadiness on feet: Secondary | ICD-10-CM

## 2022-04-04 DIAGNOSIS — R262 Difficulty in walking, not elsewhere classified: Secondary | ICD-10-CM

## 2022-04-04 DIAGNOSIS — R4701 Aphasia: Secondary | ICD-10-CM | POA: Diagnosis not present

## 2022-04-04 DIAGNOSIS — R471 Dysarthria and anarthria: Secondary | ICD-10-CM

## 2022-04-04 NOTE — Therapy (Signed)
OUTPATIENT SPEECH LANGUAGE PATHOLOGY TREATMENT NOTE  Patient Name: Cassidy Hernandez MRN: [id number] DOB:1941-02-03, 81 y.o., female Today's Date: 04/04/2022  Speech Therapy Progress Note  Dates of Reporting Period: 02/23/22 to 04/04/22  Objective: Patient has been seen for 10 speech therapy sessions this reporting period targeting aphasia and dysarthria. Patient is making progress toward LTGs (met 1/4 active) and met 3/3 active STGs this reporting period. See skilled intervention, clinical impressions, and goals below for details.    REFERRING PROVIDER: Merrilee Seashore, NP     End of Session - 04/04/22 1515     Visit Number 30    Number of Visits 24    Date for SLP Re-Evaluation 06/14/22    Authorization Type BCBS MCR; $10 copay    Authorization - Visit Number 10    Progress Note Due on Visit 10    SLP Start Time 1500    SLP Stop Time  1600    SLP Time Calculation (min) 60 min    Activity Tolerance Patient tolerated treatment well                 Past Medical History:  Diagnosis Date   Anginal pain ([place])    Aortic atherosclerosis    Cancer ([place])    Carotid artery stenosis 02/01/2010   a.) Doppler 16/96/7893: 81% LICA and 01% RICA. b.) Doppler 75/05/2584: >27% LICA and 78% RICA. c.) Doppler 10/25/16: >70% Bilater ICAs   Chronic bilateral low back pain with left-sided sciatica    CKD (chronic kidney disease), stage III (HCC)    Coronary artery disease    a.) LHC 12/24/2014 -> small LAD system; diffuse CTO of LAD. 60% RCA. LM and LCx with no sig disease. no intervention; med mgmt.   Degenerative arthritis    Diastolic dysfunction    a.) TTE 09/20/2018: EF 55%, G1DD, mild LAE and RVE, triv-mild panvalvular regurgitation.   Dysarthria    GERD (gastroesophageal reflux disease)    Hepatic steatosis    History of kidney stones    HLD (hyperlipidemia)    Hypertension    Kidney stone    Lumbar radiculopathy    Major depression in remission ([place])    PVD (peripheral vascular  disease) ([place])    Renal cyst, right 06/02/2015   a.) CT 06/02/2015 --> 4.4 cm RIGHT renal mass; MRI recommended. b.) MRI 06/16/2015 --> 3.8 x 4.2 x 4.0 cm proteinaceous/hemmorhagic cyst.   Subclavian steal syndrome    a.) LHC 12/24/2014 --> tight eccentric 90% ostial stenosis with damping.   T2DM (type 2 diabetes mellitus) [facility])    Past Surgical History:  Procedure Laterality Date   ABDOMINAL HYSTERECTOMY     AUGMENTATION MAMMAPLASTY Bilateral 1980   BACK SURGERY  09/2018   BICEPT TENODESIS  06/10/2021   Procedure: BICEPS TENODESIS;  Surgeon: Corky Mull, MD;  Location: [facility];  Service: Orthopedics;;   CARDIAC CATHETERIZATION Left 12/24/2014   Procedure: LEFT HEARD CATHETERIZATION; Location: [facility]; Surgeon: Laurence Aly, MD   CHOLECYSTECTOMY     COLONOSCOPY N/A 02/11/2016   Procedure: COLONOSCOPY;  Surgeon: Manya Silvas, MD;  Location: [facility] ENDOSCOPY;  Service: Endoscopy;  Laterality: N/A;   IR CT HEAD LTD  11/16/2021   IR CT HEAD LTD  11/16/2021   IR INTRAVSC STENT CERV CAROTID W/O EMB-PROT MOD SED INC ANGIO  11/18/2021   IR PERCUTANEOUS ART THROMBECTOMY/INFUSION INTRACRANIAL INC DIAG ANGIO  11/16/2021   RADIOLOGY WITH ANESTHESIA N/A 11/16/2021   Procedure: IR WITH ANESTHESIA- CODE STROKE;  Surgeon: Radiologist, Medication, MD;  Location: [place];  Service: Radiology;  Laterality: N/A;   REVERSE SHOULDER ARTHROPLASTY Right 06/10/2021   Procedure: REVERSE SHOULDER ARTHROPLASTY;  Surgeon: Corky Mull, MD;  Location: [facility];  Service: Orthopedics;  Laterality: Right;   Patient Active Problem List   Diagnosis Date Noted   Benign hypertension with CKD (chronic kidney disease) stage III ([place]) 03/02/2022   CAD (coronary artery disease) 03/02/2022   Hypercholesteremia 03/02/2022   Right hemiparesis ([place]) 12/16/2021   Acute ischemic left MCA stroke ([place]) 11/16/2021   Middle cerebral artery embolism, 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   Status post reverse arthroplasty of shoulder, right 06/10/2021    Traumatic complete tear of right rotator cuff 02/26/2021   Major depression in remission ([place]) 06/25/2020   Dysarthria 09/20/2018   Gastroesophageal reflux disease without esophagitis 09/20/2018   Lumbar radiculopathy 09/20/2018   Healthcare maintenance 05/02/2018   Controlled type 2 diabetes mellitus with stage 3 chronic kidney disease, without long-term current use of insulin ([place]) 01/30/2018   Chronic bilateral low back pain with left-sided sciatica 12/07/2016   Hepatic steatosis 11/23/2015   Carotid artery disease ([place]) 01/06/2010    ONSET DATE: 11/16/21   REFERRING DIAG: L MCA CVA   HPI: Pt is a 81 y.o. 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 Patient is s/p thrombectomy with left ICA stent placement 11/16/21. She completed inpatient rehabilitation at [facility] 11/25/21-12/07/21 and was discharged home with family. Patient known to this clinic from prior course of voice therapy in 2018. Pt with documented hx of voice and speech changes (dysarthria) per videostroboscopy 12/26/17; did not attend therapy.   THERAPY DIAG:  Dysarthria and anarthria  Aphasia  Rationale for Evaluation and Treatment Rehabilitation  SUBJECTIVE: Pt asked SLP questions about vacation.  Pt accompanied by: Rod Holler, sister  PAIN: No Are you having pain? No     OBJECTIVE:   TODAY'S TREATMENT: Reviewed goals and reassessed progress. Pt used compensations for aphasia in 10 minute conversation effectively. Pt used intelligibility strategies in sentence level reading 95% acc, and then provided sentence level responses to questions using strategies 100% acc. Wrote sentences of 5-7 words 90% acc with rare min cues for double checking (self-corrected omitted word and syntax). Worked with pt to ID new goals; will work next session to develop advanced voice/dysarthria HEP pending receipt of ENT report from last week, which has been requested.   PATIENT EDUCATION: Education details: homework  tasks Person educated: Patient Education method: Explanation, Demonstration, and Verbal cues Education comprehension: verbalized understanding and needs further education    SLP Short Term Goals - 04/04/22 1541       SLP SHORT TERM GOAL #5   Title Pt will write sentences of 5-7 words 90% accuracy (self correction allowed) with occasional min cues.    Time 10    Period --   sessions   Status Achieved      Additional Short Term Goals   Additional Short Term Goals Yes      SLP SHORT TERM GOAL #6   Title Pt will use intelligibility strategies (loudness, breath support, overarticulation) for sentence level responses 90% accuracy with min cues.    Time 10    Period --   sessions   Status Achieved      SLP SHORT TERM GOAL #7   Title Pt will use aphasia compensations in 10 minutes moderately complex conversation with fewer than 3 listener questions necessary for clarification of message.  Time 10    Period --   sessions   Status Achieved      SLP SHORT TERM GOAL #8   Title Patient will complete HEP for voice building with rare min cues.    Time 10   sessions   Status New      SLP SHORT TERM GOAL  #9   TITLE Pt will maintain average >72 dB and intelligibility >95%  in 5 minutes conversation.    Time 10   sessions   Status New             SLP Long Term Goals - 04/04/22 1544     SLP LONG TERM GOAL #3   Title Pt will demonstrate reading comprehension of mod complex short story or articles of interest with technology supports (text-to-speech, ereader, etc) by generating summary or answering questions >80% accuracy.    Baseline 02/23/22: Reads/comprehends simple paragraphs unassisted. 04/04/22 comprehends mod complex paragraphs with mod A    Time --    Period Weeks    Status On-going        SLP LONG TERM GOAL #4   Title Pt will self-advocate for communication needs in community or family outings (per her report) to increase confidence in social interactions.    Time 12     Period Weeks    Status On-going      SLP LONG TERM GOAL #5   Title Pt will report reduced frustration with communication breakdowns by implementing aphasia compensation outside [place] room.    Baseline 02/24/22: frustrated 3 times per day on average    Time 12    Period Weeks    Status Achieved      SLP LONG TERM GOAL #6   Title Pt will use intelligibility strategies (loudness, breath support, overarticulation) 80% accuracy in 10 minutes conversation with modified independence.    Time 12    Period Weeks    Status On-going                   Plan    Clinical Impression Statement Pt continues to improve verbal and written expression, reading comprehension, vocal quality and intelligibility. Aphasia severity not formally reassessed this visit but appears clinically more mild in nature. Patient agrees her voice and intelligibility is her greatest concern at this time. New short-term goals added to reflect this. Pt is progressing toward goals and benefits from continued skilled ST intervention per plan of care.    Speech Therapy Frequency 2x / week    Duration 2 weeks    Treatment/Interventions SLP instruction and feedback;Multimodal communcation approach;Patient/family education;Functional tasks;Cueing hierarchy;Compensatory techniques;Compensatory strategies    Potential to Achieve Goals Good    Potential Considerations Family/community support    SLP Home Exercise Plan [facility] Therapy    Consulted and Agree with Plan of Care Patient             Deneise Lever, MS, CCC-SLP Speech-Language Pathologist [phone]

## 2022-04-04 NOTE — Therapy (Signed)
OUTPATIENT PHYSICAL THERAPY TREATMENT NOTE  Patient Name: Cassidy Hernandez MRN: [id number] DOB:Jul 18, 1941, 81 y.o., female Today's Date: 04/04/2022  PCP: Kirk Ruths, MD REFERRING PROVIDER: Rayann Heman, NP   PT End of Session - 04/04/22 1346     Visit Number 27    Number of Visits 37    Date for PT Re-Evaluation 05/09/22    Authorization Type BCBS Medicare    Authorization Time Period 12/21/21-03/15/22    Progress Note Due on Visit 10    PT Start Time 1345    PT Stop Time 1429    PT Time Calculation (min) 44 min    Equipment Utilized During Treatment Gait belt    Activity Tolerance Patient tolerated treatment well;No increased pain    Behavior During Therapy [facility] for tasks assessed/performed                    Past Medical History:  Diagnosis Date   Anginal pain ([place])    Aortic atherosclerosis    Cancer ([place])    Carotid artery stenosis 02/01/2010   a.) Doppler 70/62/3762: 83% LICA and 15% RICA. b.) Doppler 17/61/6073: >71% LICA and 06% RICA. c.) Doppler 10/25/16: >70% Bilater ICAs   Chronic bilateral low back pain with left-sided sciatica    CKD (chronic kidney disease), stage III (HCC)    Coronary artery disease    a.) LHC 12/24/2014 -> small LAD system; diffuse CTO of LAD. 60% RCA. LM and LCx with no sig disease. no intervention; med mgmt.   Degenerative arthritis    Diastolic dysfunction    a.) TTE 09/20/2018: EF 55%, G1DD, mild LAE and RVE, triv-mild panvalvular regurgitation.   Dysarthria    GERD (gastroesophageal reflux disease)    Hepatic steatosis    History of kidney stones    HLD (hyperlipidemia)    Hypertension    Kidney stone    Lumbar radiculopathy    Major depression in remission ([place])    PVD (peripheral vascular disease) ([place])    Renal cyst, right 06/02/2015   a.) CT 06/02/2015 --> 4.4 cm RIGHT renal mass; MRI recommended. b.) MRI 06/16/2015 --> 3.8 x 4.2 x 4.0 cm proteinaceous/hemmorhagic cyst.   Subclavian steal syndrome    a.) LHC  12/24/2014 --> tight eccentric 90% ostial stenosis with damping.   T2DM (type 2 diabetes mellitus) [facility])    Past Surgical History:  Procedure Laterality Date   ABDOMINAL HYSTERECTOMY     AUGMENTATION MAMMAPLASTY Bilateral 1980   BACK SURGERY  09/2018   BICEPT TENODESIS  06/10/2021   Procedure: BICEPS TENODESIS;  Surgeon: Corky Mull, MD;  Location: [place] [facility];  Service: Orthopedics;;   CARDIAC CATHETERIZATION Left 12/24/2014   Procedure: LEFT HEARD CATHETERIZATION; Location: [facility]; Surgeon: Laurence Aly, MD   CHOLECYSTECTOMY     COLONOSCOPY N/A 02/11/2016   Procedure: COLONOSCOPY;  Surgeon: Manya Silvas, MD;  Location: [facility] ENDOSCOPY;  Service: Endoscopy;  Laterality: N/A;   IR CT HEAD LTD  11/16/2021   IR CT HEAD LTD  11/16/2021   IR INTRAVSC STENT CERV CAROTID W/O EMB-PROT MOD SED INC ANGIO  11/18/2021   IR PERCUTANEOUS ART THROMBECTOMY/INFUSION INTRACRANIAL INC DIAG ANGIO  11/16/2021   RADIOLOGY WITH ANESTHESIA N/A 11/16/2021   Procedure: IR WITH ANESTHESIA- CODE STROKE;  Surgeon: Radiologist, Medication, MD;  Location: [place];  Service: Radiology;  Laterality: N/A;   REVERSE SHOULDER ARTHROPLASTY Right 06/10/2021   Procedure: REVERSE SHOULDER ARTHROPLASTY;  Surgeon: Corky Mull, MD;  Location: [facility]  ORS;  Service: Orthopedics;  Laterality: Right;   Patient Active Problem List   Diagnosis Date Noted   Benign hypertension with CKD (chronic kidney disease) stage III ([place]) 03/02/2022   CAD (coronary artery disease) 03/02/2022   Hypercholesteremia 03/02/2022   Right hemiparesis ([place]) 12/16/2021   Acute ischemic left MCA stroke ([place]) 11/16/2021   Middle cerebral artery embolism, 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   Status post reverse arthroplasty of shoulder, right 06/10/2021   Traumatic complete tear of right rotator cuff 02/26/2021   Major depression in remission ([place]) 06/25/2020   Dysarthria 09/20/2018   Gastroesophageal reflux disease without esophagitis 09/20/2018   Lumbar radiculopathy 09/20/2018    Healthcare maintenance 05/02/2018   Controlled type 2 diabetes mellitus with stage 3 chronic kidney disease, without long-term current use of insulin ([place]) 01/30/2018   Chronic bilateral low back pain with left-sided sciatica 12/07/2016   Hepatic steatosis 11/23/2015   Carotid artery disease ([place]) 01/06/2010    REFERRING DIAG: V25.366 (ICD-10-CM) - Cerebral infarction due to unspecified occlusion or stenosis of left middle cerebral artery  THERAPY DIAG:  Pain in right foot  Unsteadiness on feet  Difficulty in walking, not elsewhere classified  Rationale for Evaluation and Treatment Rehabilitation  PERTINENT HISTORY: Pt Recent L MCA, s/p thrombectomy and ICA stenting. Pt with speech deficits. Pt has been somewhat unsteady with a few falls falling backward however DTR reports this is not new, but has been ongoing. Pt seen at our [facility] in 2018 for low back pain issues in the setting of spinal stenosis.  PRECAUTIONS: N/A  SUBJECTIVE:  Patient presents with no pain. No falls or LOB since last session.   PAIN:  None reported today.  TODAY'S TREATMENT:   Therapeutic Exercises:  10x STS without UE support - VC to lean trunk forward- Patient was surprised in her ability with only VC today. Seated #3 ankle weight:  -LAQ 15x each LE  -march 15x each LE  -lateral step 15x each side   -heel raise 15x GTB abduction 15x  Neuromuscular Re-Ed: Lateral step over orange hurdle 10x each LE; cues for hip flexion Airex pad vertical and horizontal head turns with dual task and reaching for letters for word game. X6 minutes  Ambulate edges of gym with visual scan and verbal cueing for reaching cones and passing to PT with RW    PATIENT EDUCATION: Education details: exercise technique, body mechanics Person educated: Patient Education method: Explanation, Demonstration, Tactile cues, and Verbal cues Education comprehension: verbalized understanding, returned demonstration, verbal cues  required, tactile cues required, and needs further education   HOME EXERCISE PROGRAM:  *see prior notes     PT Short Term Goals       PT SHORT TERM GOAL #1   Title Independence in HEP for improved balance and strength    Baseline Issued at eval  8/3: Pt reports she is performing her HEP every other day, not performing all her exercises   Time 3    Period Weeks    Status On-going    Target Date 04/11/2022       PT SHORT TERM GOAL #2   Title 5xSTS hands free <19sec in order to indicate decreased fall risk   Baseline Eval: >24sec hands free; 8/3:  32 sec hands free, CGA   Time 4    Period Weeks    Status Revised    Target Date 04/11/2022       PT SHORT TERM GOAL #3   Title The pt will demonstrate  Ankle DF MMT 5/5 bilat for improved gait mechanics and functional mobility   Baseline Eval: 4+/5 Right, 4/5 Left; 8/3: 4+/5 B   Time 4    Period Weeks    Status Revised     Target Date 04/11/2022               PT Long Term Goals       PT LONG TERM GOAL #1   Title Pt will ambulate at least 1000 ft on the 6MWT for progression to community ambulator and improve gait ability   Baseline July 2019: 13101f; 03/10/22: 580 ft with 4WW   Time 8    Period Weeks    Status Revised    Target Date 05/09/2022       PT LONG TERM GOAL #2   Title Pt will complete 5xSTS <13 sec hands free to indicate decreased fall risk and increased BLE power.   Baseline eval: >24sec hands free; 8/3: 32 sec hands free, CGA   Time 8    Period Weeks    Status Revised    Target Date 05/09/2022       PT LONG TERM GOAL #3   Title Pt to demonstrate retroAMB without device >0.252m s LOB to demonstrate improved ankle righting and control.    Baseline Eval: <0.0143mand c 3 LOB 03/10/22: .08 m/s with 4WW    Time 10    Period Weeks    Status Partially Met   Target Date 06/06/2022      PT LONG TERM GOAL #4   Title The pt FOTO Score will increase by >15 points to indicate improved functional mobility and  QOL    Baseline eval: 48; 03/10/2022: 61% (previously 62% on 02/02/22)    Time 12    Period Weeks    Status Achieved (revised to indicate functional purpose of goal)    Target Date              Plan     Clinical Impression Statement Patient presents with excellent motivation throughout session. Ambulation and stabilization with head turns performed with patient challenged by dual task requiring occasional verbal cueing for orientation.  Pt will continue to benefit from skilled OPPT services to address deficits and improve overall safety and independence with functional mobility.     Personal Factors and Comorbidities Past/Current Experience    Examination-Activity Limitations Dressing;Transfers;Bend;Locomotion Level;Stairs;Stand    Examination-Participation Restrictions Cleaning;Shop;Meal Prep;YarValla Leaverrk;Laundry    Stability/Clinical Decision Making Evolving/Moderate complexity    Rehab Potential Good    Clinical Impairments Affecting Rehab Potential (+) motivation, social support (-) age, chronicity of pain, fairly sedentary lifestyle    PT Frequency 2x / week    PT Duration 12 weeks    PT Treatment/Interventions Aquatic Therapy;Moist Heat;Functional mobility training;Dry needling;Neuromuscular re-education;Traction;Ultrasound;Iontophoresis 4mg88m Dexamethasone;ADLs/Self Care Home Management;Cryotherapy;Electrical Stimulation;Therapeutic exercise;Therapeutic activities;Patient/family education;Taping;Gait training;Stair training;DME Instruction;Balance training;Passive range of motion    PT Next Visit Plan Progress standing functional interventions as appropriate, updated HEP, assess hip mobility for potential restrictions   PT Home Exercise Plan Eval: seated heel raises, toe raises, and STS from elevated surface. 01/04/2022=Access Code: [email or web address]: [email or web address]    Consulted and Agree with Plan of Care Patient;Family member/caregiver    Family Member Consulted  EarnEino Farber  2:29 PM,04/04/22 Physical Therapist - [facility]

## 2022-04-06 ENCOUNTER — Encounter: Payer: Medicare Other | Admitting: Speech Pathology

## 2022-04-06 ENCOUNTER — Ambulatory Visit: Payer: Medicare Other

## 2022-04-06 ENCOUNTER — Encounter: Payer: Medicare Other | Admitting: Occupational Therapy

## 2022-04-06 ENCOUNTER — Ambulatory Visit: Payer: Medicare Other | Admitting: Speech Pathology

## 2022-04-06 DIAGNOSIS — R471 Dysarthria and anarthria: Secondary | ICD-10-CM

## 2022-04-06 DIAGNOSIS — R2681 Unsteadiness on feet: Secondary | ICD-10-CM

## 2022-04-06 DIAGNOSIS — R4701 Aphasia: Secondary | ICD-10-CM

## 2022-04-06 DIAGNOSIS — R262 Difficulty in walking, not elsewhere classified: Secondary | ICD-10-CM

## 2022-04-06 DIAGNOSIS — M6281 Muscle weakness (generalized): Secondary | ICD-10-CM

## 2022-04-06 NOTE — Therapy (Signed)
OUTPATIENT PHYSICAL THERAPY TREATMENT NOTE  Patient Name: Cassidy Hernandez MRN: [id number] DOB:11-08-1940, 81 y.o., female Today's Date: 04/06/2022  PCP: Kirk Ruths, MD REFERRING PROVIDER: Rayann Heman, NP   PT End of Session - 04/06/22 1723     Visit Number 28    Number of Visits 37    Date for PT Re-Evaluation 05/09/22    Authorization Type BCBS Medicare    Authorization Time Period 12/21/21-03/15/22    Progress Note Due on Visit 10    PT Start Time 6962    PT Stop Time 1429    PT Time Calculation (min) 42 min    Equipment Utilized During Treatment Gait belt    Activity Tolerance Patient tolerated treatment well;No increased pain    Behavior During Therapy [facility] for tasks assessed/performed                     Past Medical History:  Diagnosis Date   Anginal pain ([place])    Aortic atherosclerosis    Cancer ([place])    Carotid artery stenosis 02/01/2010   a.) Doppler 95/28/4132: 44% LICA and 01% RICA. b.) Doppler 02/72/5366: >44% LICA and 03% RICA. c.) Doppler 10/25/16: >70% Bilater ICAs   Chronic bilateral low back pain with left-sided sciatica    CKD (chronic kidney disease), stage III (HCC)    Coronary artery disease    a.) LHC 12/24/2014 -> small LAD system; diffuse CTO of LAD. 60% RCA. LM and LCx with no sig disease. no intervention; med mgmt.   Degenerative arthritis    Diastolic dysfunction    a.) TTE 09/20/2018: EF 55%, G1DD, mild LAE and RVE, triv-mild panvalvular regurgitation.   Dysarthria    GERD (gastroesophageal reflux disease)    Hepatic steatosis    History of kidney stones    HLD (hyperlipidemia)    Hypertension    Kidney stone    Lumbar radiculopathy    Major depression in remission ([place])    PVD (peripheral vascular disease) ([place])    Renal cyst, right 06/02/2015   a.) CT 06/02/2015 --> 4.4 cm RIGHT renal mass; MRI recommended. b.) MRI 06/16/2015 --> 3.8 x 4.2 x 4.0 cm proteinaceous/hemmorhagic cyst.   Subclavian steal syndrome    a.) LHC  12/24/2014 --> tight eccentric 90% ostial stenosis with damping.   T2DM (type 2 diabetes mellitus) [facility])    Past Surgical History:  Procedure Laterality Date   ABDOMINAL HYSTERECTOMY     AUGMENTATION MAMMAPLASTY Bilateral 1980   BACK SURGERY  09/2018   BICEPT TENODESIS  06/10/2021   Procedure: BICEPS TENODESIS;  Surgeon: Corky Mull, MD;  Location: [place] [facility];  Service: Orthopedics;;   CARDIAC CATHETERIZATION Left 12/24/2014   Procedure: LEFT HEARD CATHETERIZATION; Location: [facility]; Surgeon: Laurence Aly, MD   CHOLECYSTECTOMY     COLONOSCOPY N/A 02/11/2016   Procedure: COLONOSCOPY;  Surgeon: Manya Silvas, MD;  Location: [facility] ENDOSCOPY;  Service: Endoscopy;  Laterality: N/A;   IR CT HEAD LTD  11/16/2021   IR CT HEAD LTD  11/16/2021   IR INTRAVSC STENT CERV CAROTID W/O EMB-PROT MOD SED INC ANGIO  11/18/2021   IR PERCUTANEOUS ART THROMBECTOMY/INFUSION INTRACRANIAL INC DIAG ANGIO  11/16/2021   RADIOLOGY WITH ANESTHESIA N/A 11/16/2021   Procedure: IR WITH ANESTHESIA- CODE STROKE;  Surgeon: Radiologist, Medication, MD;  Location: [place];  Service: Radiology;  Laterality: N/A;   REVERSE SHOULDER ARTHROPLASTY Right 06/10/2021   Procedure: REVERSE SHOULDER ARTHROPLASTY;  Surgeon: Corky Mull, MD;  Location:  [facility];  Service: Orthopedics;  Laterality: Right;   Patient Active Problem List   Diagnosis Date Noted   Benign hypertension with CKD (chronic kidney disease) stage III ([place]) 03/02/2022   CAD (coronary artery disease) 03/02/2022   Hypercholesteremia 03/02/2022   Right hemiparesis ([place]) 12/16/2021   Acute ischemic left MCA stroke ([place]) 11/16/2021   Middle cerebral artery embolism, 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   Status post reverse arthroplasty of shoulder, right 06/10/2021   Traumatic complete tear of right rotator cuff 02/26/2021   Major depression in remission ([place]) 06/25/2020   Dysarthria 09/20/2018   Gastroesophageal reflux disease without esophagitis 09/20/2018   Lumbar radiculopathy 09/20/2018    Healthcare maintenance 05/02/2018   Controlled type 2 diabetes mellitus with stage 3 chronic kidney disease, without long-term current use of insulin ([place]) 01/30/2018   Chronic bilateral low back pain with left-sided sciatica 12/07/2016   Hepatic steatosis 11/23/2015   Carotid artery disease ([place]) 01/06/2010    REFERRING DIAG: J03.009 (ICD-10-CM) - Cerebral infarction due to unspecified occlusion or stenosis of left middle cerebral artery  THERAPY DIAG:  Unsteadiness on feet  Muscle weakness (generalized)  Difficulty in walking, not elsewhere classified  Rationale for Evaluation and Treatment Rehabilitation  PERTINENT HISTORY: Pt Recent L MCA, s/p thrombectomy and ICA stenting. Pt with speech deficits. Pt has been somewhat unsteady with a few falls falling backward however DTR reports this is not new, but has been ongoing. Pt seen at our Sports clinic in 2018 for low back pain issues in the setting of spinal stenosis.  PRECAUTIONS: N/A  SUBJECTIVE:  Pt presents wearing new sneakers. Reports no foot pain currently. Pt reports no other concerns.   PAIN:  None reported today.  TODAY'S TREATMENT:   Therapeutic Exercises:  10x, 5x STS without UE support - Continued vc/tc and demo to lean trunk forward for correct technqiue Seated #4 ankle weight:  -LAQ 12x each LE, removed 4# weight from LLE due to reports of pain in L thigh "painful pull," no pain without weight  -march 12x each LE  -lateral step over orange hurdle (seated) 10-15x each side without weight on LLE but with 4# on RLE. Pt reports feeling a painful pull LLE even without weight.. Rates challenging, exercise discontinued.   -heel raise 15x GTB abduction 15x, 10x  Neuromuscular Re-Ed: FWD step-tap over orange hurdle x multiple reps   Progressed to full step over 10x. Pt uses BUE support throughout  Airex pad NBOS EO 2x30 sec Airex pad vertical and horizontal head turns 10x for each, performed in WBOS. Very difficult  for pt Airex pad EC 2x30 sec  Comments: Requires up to min assist to correct for LOB.     PATIENT EDUCATION: Education details: exercise technique, body mechanics Person educated: Patient Education method: Explanation, Demonstration, Tactile cues, and Verbal cues Education comprehension: verbalized understanding, returned demonstration, verbal cues required, tactile cues required, and needs further education   HOME EXERCISE PROGRAM:  *see prior notes     PT Short Term Goals       PT SHORT TERM GOAL #1   Title Independence in HEP for improved balance and strength    Baseline Issued at eval  8/3: Pt reports she is performing her HEP every other day, not performing all her exercises   Time 3    Period Weeks    Status On-going    Target Date 04/11/2022       PT SHORT TERM GOAL #2   Title 5xSTS hands free <19sec in  order to indicate decreased fall risk   Baseline Eval: >24sec hands free; 8/3:  32 sec hands free, CGA   Time 4    Period Weeks    Status Revised    Target Date 04/11/2022       PT SHORT TERM GOAL #3   Title The pt will demonstrate Ankle DF MMT 5/5 bilat for improved gait mechanics and functional mobility   Baseline Eval: 4+/5 Right, 4/5 Left; 8/3: 4+/5 B   Time 4    Period Weeks    Status Revised     Target Date 04/11/2022               PT Long Term Goals       PT LONG TERM GOAL #1   Title Pt will ambulate at least 1000 ft on the 6MWT for progression to community ambulator and improve gait ability   Baseline July 2019: 1345f; 03/10/22: 580 ft with 4WW   Time 8    Period Weeks    Status Revised    Target Date 05/09/2022       PT LONG TERM GOAL #2   Title Pt will complete 5xSTS <13 sec hands free to indicate decreased fall risk and increased BLE power.   Baseline eval: >24sec hands free; 8/3: 32 sec hands free, CGA   Time 8    Period Weeks    Status Revised    Target Date 05/09/2022       PT LONG TERM GOAL #3   Title Pt to demonstrate  retroAMB without device >0.224m s LOB to demonstrate improved ankle righting and control.    Baseline Eval: <0.0110mand c 3 LOB 03/10/22: .08 m/s with 4WW    Time 10    Period Weeks    Status Partially Met   Target Date 06/06/2022      PT LONG TERM GOAL #4   Title The pt FOTO Score will increase by >15 points to indicate improved functional mobility and QOL    Baseline eval: 48; 03/10/2022: 61% (previously 62% on 02/02/22)    Time 12    Period Weeks    Status Achieved (revised to indicate functional purpose of goal)    Target Date              Plan     Clinical Impression Statement Pt wearing appropriate shoes for PT today - has sneakers donned. Reports no foot pain. However, pt did report first-time pain in L thigh with LAQ and lateral step over exercise with and without weights. Will continue to monitor and instructed pt to report if she had symptoms following this session at next appointment. Pt will continue to benefit from skilled OPPT services to address deficits and improve overall safety and independence with functional mobility.     Personal Factors and Comorbidities Past/Current Experience    Examination-Activity Limitations Dressing;Transfers;Bend;Locomotion Level;Stairs;Stand    Examination-Participation Restrictions Cleaning;Shop;Meal Prep;YarValla Leaverrk;Laundry    Stability/Clinical Decision Making Evolving/Moderate complexity    Rehab Potential Good    Clinical Impairments Affecting Rehab Potential (+) motivation, social support (-) age, chronicity of pain, fairly sedentary lifestyle    PT Frequency 2x / week    PT Duration 12 weeks    PT Treatment/Interventions Aquatic Therapy;Moist Heat;Functional mobility training;Dry needling;Neuromuscular re-education;Traction;Ultrasound;Iontophoresis 4mg48m Dexamethasone;ADLs/Self Care Home Management;Cryotherapy;Electrical Stimulation;Therapeutic exercise;Therapeutic activities;Patient/family education;Taping;Gait training;Stair  training;DME Instruction;Balance training;Passive range of motion    PT Next Visit Plan Progress standing functional interventions as appropriate, updated HEP, assess hip mobility  for potential restrictions   PT Home Exercise Plan Eval: seated heel raises, toe raises, and STS from elevated surface. 01/04/2022=Access Code: [email or web address]  URL: [email or web address]    Consulted and Agree with Plan of Care Patient;Family member/caregiver    Family Member Consulted Leighton Parody PT   5:29 PM,04/06/22 Physical Therapist - [facility]

## 2022-04-06 NOTE — Patient Instructions (Signed)
   Breathe before each excercise   1. Take a breath, then say "whoooooooo" while raising pitch through a straw. Take another breath, say "whhooooo" while lowering your pitch through the straw. 10 times  2.  PUSH Bear down against a chair while saying /a/ 10 times, with a lot of effort  3.  PULL up of the seat of a chair with both hands while saying /a/ 10 times with a lot of  effort  4. Glide your pitch high and low (as low and high as possible) "[employer]

## 2022-04-07 ENCOUNTER — Ambulatory Visit: Payer: Medicare Other

## 2022-04-07 ENCOUNTER — Encounter: Payer: Medicare Other | Admitting: Speech Pathology

## 2022-04-07 NOTE — Therapy (Signed)
OUTPATIENT SPEECH LANGUAGE PATHOLOGY TREATMENT NOTE  Patient Name: Cassidy Hernandez MRN: [id number] DOB:03/29/1941, 81 y.o., female Today's Date: 04/07/2022    REFERRING PROVIDER: Merrilee Seashore, NP     End of Session - 04/07/22 1054     Visit Number 31    Number of Visits 55    Date for SLP Re-Evaluation 06/14/22    Authorization Type BCBS MCR; $10 copay    Authorization - Visit Number 1    Progress Note Due on Visit 10    SLP Start Time 1500    SLP Stop Time  1600    SLP Time Calculation (min) 60 min    Activity Tolerance Patient tolerated treatment well                 Past Medical History:  Diagnosis Date   Anginal pain ([place])    Aortic atherosclerosis    Cancer ([place])    Carotid artery stenosis 02/01/2010   a.) Doppler 32/99/2426: 83% LICA and 41% RICA. b.) Doppler 96/22/2979: >89% LICA and 21% RICA. c.) Doppler 10/25/16: >70% Bilater ICAs   Chronic bilateral low back pain with left-sided sciatica    CKD (chronic kidney disease), stage III (HCC)    Coronary artery disease    a.) LHC 12/24/2014 -> small LAD system; diffuse CTO of LAD. 60% RCA. LM and LCx with no sig disease. no intervention; med mgmt.   Degenerative arthritis    Diastolic dysfunction    a.) TTE 09/20/2018: EF 55%, G1DD, mild LAE and RVE, triv-mild panvalvular regurgitation.   Dysarthria    GERD (gastroesophageal reflux disease)    Hepatic steatosis    History of kidney stones    HLD (hyperlipidemia)    Hypertension    Kidney stone    Lumbar radiculopathy    Major depression in remission ([place])    PVD (peripheral vascular disease) ([place])    Renal cyst, right 06/02/2015   a.) CT 06/02/2015 --> 4.4 cm RIGHT renal mass; MRI recommended. b.) MRI 06/16/2015 --> 3.8 x 4.2 x 4.0 cm proteinaceous/hemmorhagic cyst.   Subclavian steal syndrome    a.) LHC 12/24/2014 --> tight eccentric 90% ostial stenosis with damping.   T2DM (type 2 diabetes mellitus) [facility])    Past Surgical History:  Procedure Laterality  Date   ABDOMINAL HYSTERECTOMY     AUGMENTATION MAMMAPLASTY Bilateral 1980   BACK SURGERY  09/2018   BICEPT TENODESIS  06/10/2021   Procedure: BICEPS TENODESIS;  Surgeon: Corky Mull, MD;  Location: [facility];  Service: Orthopedics;;   CARDIAC CATHETERIZATION Left 12/24/2014   Procedure: LEFT HEARD CATHETERIZATION; Location: [facility]; Surgeon: Laurence Aly, MD   CHOLECYSTECTOMY     COLONOSCOPY N/A 02/11/2016   Procedure: COLONOSCOPY;  Surgeon: Manya Silvas, MD;  Location: [facility] ENDOSCOPY;  Service: Endoscopy;  Laterality: N/A;   IR CT HEAD LTD  11/16/2021   IR CT HEAD LTD  11/16/2021   IR INTRAVSC STENT CERV CAROTID W/O EMB-PROT MOD SED INC ANGIO  11/18/2021   IR PERCUTANEOUS ART THROMBECTOMY/INFUSION INTRACRANIAL INC DIAG ANGIO  11/16/2021   RADIOLOGY WITH ANESTHESIA N/A 11/16/2021   Procedure: IR WITH ANESTHESIA- CODE STROKE;  Surgeon: Radiologist, Medication, MD;  Location: [place];  Service: Radiology;  Laterality: N/A;   REVERSE SHOULDER ARTHROPLASTY Right 06/10/2021   Procedure: REVERSE SHOULDER ARTHROPLASTY;  Surgeon: Corky Mull, MD;  Location: [facility];  Service: Orthopedics;  Laterality: Right;   Patient Active Problem List   Diagnosis Date Noted   Benign hypertension  with CKD (chronic kidney disease) stage III ([place]) 03/02/2022   CAD (coronary artery disease) 03/02/2022   Hypercholesteremia 03/02/2022   Right hemiparesis ([place]) 12/16/2021   Acute ischemic left MCA stroke ([place]) 11/16/2021   Middle cerebral artery embolism, 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   Status post reverse arthroplasty of shoulder, right 06/10/2021   Traumatic complete tear of right rotator cuff 02/26/2021   Major depression in remission ([place]) 06/25/2020   Dysarthria 09/20/2018   Gastroesophageal reflux disease without esophagitis 09/20/2018   Lumbar radiculopathy 09/20/2018   Healthcare maintenance 05/02/2018   Controlled type 2 diabetes mellitus with stage 3 chronic kidney disease, without long-term current use of insulin ([place])  01/30/2018   Chronic bilateral low back pain with left-sided sciatica 12/07/2016   Hepatic steatosis 11/23/2015   Carotid artery disease ([place]) 01/06/2010    ONSET DATE: 11/16/21   REFERRING DIAG: L MCA CVA   HPI: Pt is a 81 y.o. 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 Patient is s/p thrombectomy with left ICA stent placement 11/16/21. She completed inpatient rehabilitation at [facility] 11/25/21-12/07/21 and was discharged home with family. Patient known to this clinic from prior course of voice therapy in 2018. Pt with documented hx of voice and speech changes (dysarthria) per videostroboscopy 12/26/17; did not attend therapy.   THERAPY DIAG:  Dysarthria and anarthria  Aphasia  Rationale for Evaluation and Treatment Rehabilitation  SUBJECTIVE: "How are you doing?"  Pt accompanied by: Rod Holler, sister  PAIN: No Are you having pain? No     OBJECTIVE:   TODAY'S TREATMENT: Based on ENT findings (sluggish vocal cord movement, no paralysis, no ENT pathology seen), developed HEP for dysarthria/vocal quality. Intensity-based cues and adduction exercises generally improved vocal quality, however pt has intermittent strain, hoarseness. Benefitted from straw phonation to reduce strain/tension and improve quality. Trained pt in straw phonation and pitch glides with straw, as well as intensity-based exercises and glides ("knoll").    PATIENT EDUCATION: Education details: voice building Person educated: Patient Education method: Explanation, Demonstration, and Verbal cues Education comprehension: verbalized understanding and needs further education    SLP Short Term Goals - 04/04/22 1541       SLP SHORT TERM GOAL #5   Title Pt will write sentences of 5-7 words 90% accuracy (self correction allowed) with occasional min cues.    Time 10    Period --   sessions   Status Achieved      Additional Short Term Goals   Additional Short Term Goals Yes      SLP SHORT TERM  GOAL #6   Title Pt will use intelligibility strategies (loudness, breath support, overarticulation) for sentence level responses 90% accuracy with min cues.    Time 10    Period --   sessions   Status Achieved      SLP SHORT TERM GOAL #7   Title Pt will use aphasia compensations in 10 minutes moderately complex conversation with fewer than 3 listener questions necessary for clarification of message.    Time 10    Period --   sessions   Status Achieved      SLP SHORT TERM GOAL #8   Title Patient will complete HEP for voice building with rare min cues.    Time 10   sessions   Status New      SLP SHORT TERM GOAL  #9   TITLE Pt will maintain average >72 dB and intelligibility >95%  in 5 minutes conversation.  Time 10   sessions   Status New             SLP Long Term Goals - 04/04/22 1544     SLP LONG TERM GOAL #3   Title Pt will demonstrate reading comprehension of mod complex short story or articles of interest with technology supports (text-to-speech, ereader, etc) by generating summary or answering questions >80% accuracy.    Baseline 02/23/22: Reads/comprehends simple paragraphs unassisted. 04/04/22 comprehends mod complex paragraphs with mod A    Time --    Period Weeks    Status On-going        SLP LONG TERM GOAL #4   Title Pt will self-advocate for communication needs in community or family outings (per her report) to increase confidence in social interactions.    Time 12    Period Weeks    Status On-going      SLP LONG TERM GOAL #5   Title Pt will report reduced frustration with communication breakdowns by implementing aphasia compensation outside [place] room.    Baseline 02/24/22: frustrated 3 times per day on average    Time 12    Period Weeks    Status Achieved      SLP LONG TERM GOAL #6   Title Pt will use intelligibility strategies (loudness, breath support, overarticulation) 80% accuracy in 10 minutes conversation with modified independence.    Time 12     Period Weeks    Status On-going                   Plan    Clinical Impression Statement Pt continues to improve verbal and written expression, reading comprehension, vocal quality and intelligibility. Aphasia appears clinically more mild in nature. ENT did not find pathology; given prior hx of dysarthria (progressive) prior to CVA, difficult to determine extent of dysarthria progression vs new onset dysarthria secondary to CVA, although pt endorses worsening since CVA. At times increasing intensity improves quality, however there is intermittent tension requiring relaxation exercises. Patient agrees her voice and intelligibility is her greatest concern at this time. Pt is progressing toward goals and benefits from continued skilled ST intervention per plan of care.    Speech Therapy Frequency 2x / week    Duration 2 weeks    Treatment/Interventions SLP instruction and feedback;Multimodal communcation approach;Patient/family education;Functional tasks;Cueing hierarchy;Compensatory techniques;Compensatory strategies    Potential to Achieve Goals Good    Potential Considerations Family/community support    SLP Home Exercise Plan TalkPath Therapy    Consulted and Agree with Plan of Care Patient             Deneise Lever, MS, CCC-SLP Speech-Language Pathologist [phone]

## 2022-04-12 ENCOUNTER — Ambulatory Visit: Payer: Medicare Other

## 2022-04-12 ENCOUNTER — Ambulatory Visit: Payer: Medicare Other | Attending: Nurse Practitioner | Admitting: Speech Pathology

## 2022-04-12 DIAGNOSIS — R278 Other lack of coordination: Secondary | ICD-10-CM

## 2022-04-12 DIAGNOSIS — R2689 Other abnormalities of gait and mobility: Secondary | ICD-10-CM | POA: Diagnosis present

## 2022-04-12 DIAGNOSIS — M6281 Muscle weakness (generalized): Secondary | ICD-10-CM | POA: Insufficient documentation

## 2022-04-12 DIAGNOSIS — R4701 Aphasia: Secondary | ICD-10-CM | POA: Diagnosis present

## 2022-04-12 DIAGNOSIS — R269 Unspecified abnormalities of gait and mobility: Secondary | ICD-10-CM

## 2022-04-12 DIAGNOSIS — R2681 Unsteadiness on feet: Secondary | ICD-10-CM

## 2022-04-12 DIAGNOSIS — R262 Difficulty in walking, not elsewhere classified: Secondary | ICD-10-CM | POA: Insufficient documentation

## 2022-04-12 DIAGNOSIS — R471 Dysarthria and anarthria: Secondary | ICD-10-CM | POA: Insufficient documentation

## 2022-04-12 DIAGNOSIS — M79671 Pain in right foot: Secondary | ICD-10-CM | POA: Diagnosis present

## 2022-04-12 DIAGNOSIS — R296 Repeated falls: Secondary | ICD-10-CM | POA: Insufficient documentation

## 2022-04-12 NOTE — Therapy (Signed)
OUTPATIENT PHYSICAL THERAPY TREATMENT NOTE  Patient Name: Cassidy Hernandez MRN: [id number] DOB:10-06-1940, 81 y.o., female Today's Date: 04/12/2022  PCP: Kirk Ruths, MD REFERRING PROVIDER: Rayann Heman, NP   PT End of Session - 04/12/22 1526     Visit Number 29    Number of Visits 37    Date for PT Re-Evaluation 05/09/22    Authorization Type BCBS Medicare    Progress Note Due on Visit 30    PT Start Time 1520    PT Stop Time 1600    PT Time Calculation (min) 40 min    Equipment Utilized During Treatment Gait belt    Activity Tolerance Patient tolerated treatment well;No increased pain;Patient limited by fatigue    Behavior During Therapy [facility] for tasks assessed/performed                     Past Medical History:  Diagnosis Date   Anginal pain ([place])    Aortic atherosclerosis    Cancer ([place])    Carotid artery stenosis 02/01/2010   a.) Doppler 35/45/6256: 38% LICA and 93% RICA. b.) Doppler 73/42/8768: >11% LICA and 57% RICA. c.) Doppler 10/25/16: >70% Bilater ICAs   Chronic bilateral low back pain with left-sided sciatica    CKD (chronic kidney disease), stage III (HCC)    Coronary artery disease    a.) LHC 12/24/2014 -> small LAD system; diffuse CTO of LAD. 60% RCA. LM and LCx with no sig disease. no intervention; med mgmt.   Degenerative arthritis    Diastolic dysfunction    a.) TTE 09/20/2018: EF 55%, G1DD, mild LAE and RVE, triv-mild panvalvular regurgitation.   Dysarthria    GERD (gastroesophageal reflux disease)    Hepatic steatosis    History of kidney stones    HLD (hyperlipidemia)    Hypertension    Kidney stone    Lumbar radiculopathy    Major depression in remission ([place])    PVD (peripheral vascular disease) ([place])    Renal cyst, right 06/02/2015   a.) CT 06/02/2015 --> 4.4 cm RIGHT renal mass; MRI recommended. b.) MRI 06/16/2015 --> 3.8 x 4.2 x 4.0 cm proteinaceous/hemmorhagic cyst.   Subclavian steal syndrome    a.) LHC 12/24/2014 -->  tight eccentric 90% ostial stenosis with damping.   T2DM (type 2 diabetes mellitus) [facility])    Past Surgical History:  Procedure Laterality Date   ABDOMINAL HYSTERECTOMY     AUGMENTATION MAMMAPLASTY Bilateral 1980   BACK SURGERY  09/2018   BICEPT TENODESIS  06/10/2021   Procedure: BICEPS TENODESIS;  Surgeon: Corky Mull, MD;  Location: [place] [facility];  Service: Orthopedics;;   CARDIAC CATHETERIZATION Left 12/24/2014   Procedure: LEFT HEARD CATHETERIZATION; Location: [facility]; Surgeon: Laurence Aly, MD   CHOLECYSTECTOMY     COLONOSCOPY N/A 02/11/2016   Procedure: COLONOSCOPY;  Surgeon: Manya Silvas, MD;  Location: [facility] ENDOSCOPY;  Service: Endoscopy;  Laterality: N/A;   IR CT HEAD LTD  11/16/2021   IR CT HEAD LTD  11/16/2021   IR INTRAVSC STENT CERV CAROTID W/O EMB-PROT MOD SED INC ANGIO  11/18/2021   IR PERCUTANEOUS ART THROMBECTOMY/INFUSION INTRACRANIAL INC DIAG ANGIO  11/16/2021   RADIOLOGY WITH ANESTHESIA N/A 11/16/2021   Procedure: IR WITH ANESTHESIA- CODE STROKE;  Surgeon: Radiologist, Medication, MD;  Location: [place];  Service: Radiology;  Laterality: N/A;   REVERSE SHOULDER ARTHROPLASTY Right 06/10/2021   Procedure: REVERSE SHOULDER ARTHROPLASTY;  Surgeon: Corky Mull, MD;  Location: [facility];  Service:  Orthopedics;  Laterality: Right;   Patient Active Problem List   Diagnosis Date Noted   Benign hypertension with CKD (chronic kidney disease) stage III ([place]) 03/02/2022   CAD (coronary artery disease) 03/02/2022   Hypercholesteremia 03/02/2022   Right hemiparesis ([place]) 12/16/2021   Acute ischemic left MCA stroke ([place]) 11/16/2021   Middle cerebral artery embolism, 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   Status post reverse arthroplasty of shoulder, right 06/10/2021   Traumatic complete tear of right rotator cuff 02/26/2021   Major depression in remission ([place]) 06/25/2020   Dysarthria 09/20/2018   Gastroesophageal reflux disease without esophagitis 09/20/2018   Lumbar radiculopathy 09/20/2018   Healthcare  maintenance 05/02/2018   Controlled type 2 diabetes mellitus with stage 3 chronic kidney disease, without long-term current use of insulin ([place]) 01/30/2018   Chronic bilateral low back pain with left-sided sciatica 12/07/2016   Hepatic steatosis 11/23/2015   Carotid artery disease ([place]) 01/06/2010    REFERRING DIAG: I15.379 (ICD-10-CM) - Cerebral infarction due to unspecified occlusion or stenosis of left middle cerebral artery  THERAPY DIAG:  Unsteadiness on feet  Muscle weakness (generalized)  Difficulty in walking, not elsewhere classified  Abnormality of gait and mobility  Other lack of coordination  Other abnormalities of gait and mobility  Repeated falls  Rationale for Evaluation and Treatment Rehabilitation  PERTINENT HISTORY: Pt Recent L MCA, s/p thrombectomy and ICA stenting. Pt with speech deficits. Pt has been somewhat unsteady with a few falls falling backward however DTR reports this is not new, but has been ongoing. Pt seen at our Sports clinic in 2018 for low back pain issues in the setting of spinal stenosis.  PRECAUTIONS: N/A  SUBJECTIVE:  Pt presents wearing new sneakers. Reports no foot pain currently. Pt reports no other concerns.   PAIN:  None reported today.  TODAY'S TREATMENT:   LAQ 2x12 @ 2.5lb  Seated marching 2x12 @ 2.5lb  Elevated STS 1X15    Seated Rt ankle DF 1lb AW on foot dorsum  Seated Rt ankle DF 1lb AW on foot dorsum  Seated on dynadisc eyes closed x30 sec then marching eyes closed x30  Forward AMB x155f, then backward x166f     PATIENT EDUCATION: Education details: exercise technique, body mechanics Person educated: Patient Education method: Explanation, Demonstration, Tactile cues, and Verbal cues Education comprehension: verbalized understanding, returned demonstration, verbal cues required, tactile cues required, and needs further education   HOME EXERCISE PROGRAM:  *see prior notes     PT Short Term Goals       PT  SHORT TERM GOAL #1   Title Independence in HEP for improved balance and strength    Baseline Issued at eval  8/3: Pt reports she is performing her HEP every other day, not performing all her exercises   Time 3    Period Weeks    Status On-going    Target Date 04/11/2022       PT SHORT TERM GOAL #2   Title 5xSTS hands free <19sec in order to indicate decreased fall risk   Baseline Eval: >24sec hands free; 8/3:  32 sec hands free, CGA   Time 4    Period Weeks    Status Revised    Target Date 04/11/2022       PT SHORT TERM GOAL #3   Title The pt will demonstrate Ankle DF MMT 5/5 bilat for improved gait mechanics and functional mobility   Baseline Eval: 4+/5 Right, 4/5 Left; 8/3: 4+/5 B   Time 4  Period Weeks    Status Revised     Target Date 04/11/2022               PT Long Term Goals       PT LONG TERM GOAL #1   Title Pt will ambulate at least 1000 ft on the 6MWT for progression to community ambulator and improve gait ability   Baseline July 2019: 1368f; 03/10/22: 580 ft with 4WW   Time 8    Period Weeks    Status Revised    Target Date 05/09/2022       PT LONG TERM GOAL #2   Title Pt will complete 5xSTS <13 sec hands free to indicate decreased fall risk and increased BLE power.   Baseline eval: >24sec hands free; 8/3: 32 sec hands free, CGA   Time 8    Period Weeks    Status Revised    Target Date 05/09/2022       PT LONG TERM GOAL #3   Title Pt to demonstrate retroAMB without device >0.218m s LOB to demonstrate improved ankle righting and control.    Baseline Eval: <0.0126mand c 3 LOB 03/10/22: .08 m/s with 4WW    Time 10    Period Weeks    Status Partially Met   Target Date 06/06/2022      PT LONG TERM GOAL #4   Title The pt FOTO Score will increase by >15 points to indicate improved functional mobility and QOL    Baseline eval: 48; 03/10/2022: 61% (previously 62% on 02/02/22)    Time 12    Period Weeks    Status Achieved (revised to indicate functional  purpose of goal)    Target Date              Plan     Clinical Impression Statement Continued to work on focal strengthening and working on balance/postural control in stance and functional outcomes. Pt continues to show similar deficits as seen previously. Pt remains motivated Pt will continue to benefit from skilled OPPT services to address deficits and improve overall safety and independence with functional mobility.    Personal Factors and Comorbidities Past/Current Experience    Examination-Activity Limitations Dressing;Transfers;Bend;Locomotion Level;Stairs;Stand    Examination-Participation Restrictions Cleaning;Shop;Meal Prep;[place];Laundry    Stability/Clinical Decision Making Evolving/Moderate complexity    Rehab Potential Good    Clinical Impairments Affecting Rehab Potential (+) motivation, social support (-) age, chronicity of pain, fairly sedentary lifestyle    PT Frequency 2x / week    PT Duration 12 weeks    PT Treatment/Interventions Aquatic Therapy;Moist Heat;Functional mobility training;Dry needling;Neuromuscular re-education;Traction;Ultrasound;Iontophoresis 4mg33m Dexamethasone;ADLs/Self Care Home Management;Cryotherapy;Electrical Stimulation;Therapeutic exercise;Therapeutic activities;Patient/family education;Taping;Gait training;Stair training;DME Instruction;Balance training;Passive range of motion    PT Next Visit Plan Progress standing functional interventions as appropriate, updated HEP, assess hip mobility for potential restrictions   PT Home Exercise Plan Eval: seated heel raises, toe raises, and STS from elevated surface. 01/04/2022=Access Code: [email or web address]: [email or web address]    Consulted and Agree with Plan of Care Patient;Family member/caregiver    Family Member Consulted EarnKristopher Oppenheim 3:32 PM, 04/12/22 AllaEtta Grandchild, DPT Physical Therapist - [facility]-[phone] 3:29 PM,04/12/22 Physical Therapist - [facility]

## 2022-04-12 NOTE — Therapy (Signed)
OUTPATIENT SPEECH LANGUAGE PATHOLOGY TREATMENT NOTE  Patient Name: Cassidy Hernandez MRN: [id number] DOB:September 20, 1940, 81 y.o., female Today's Date: 04/12/2022    REFERRING PROVIDER: Merrilee Seashore, NP     End of Session - 04/12/22 1408     Visit Number 32    Number of Visits 76    Date for SLP Re-Evaluation 06/14/22    Authorization Type BCBS MCR; $10 copay    Authorization - Visit Number 2    Progress Note Due on Visit 10    SLP Start Time 1400    SLP Stop Time  1500    SLP Time Calculation (min) 60 min    Activity Tolerance Patient tolerated treatment well                 Past Medical History:  Diagnosis Date   Anginal pain ([place])    Aortic atherosclerosis    Cancer ([place])    Carotid artery stenosis 02/01/2010   a.) Doppler 61/95/0932: 67% LICA and 12% RICA. b.) Doppler 45/80/9983: >38% LICA and 25% RICA. c.) Doppler 10/25/16: >70% Bilater ICAs   Chronic bilateral low back pain with left-sided sciatica    CKD (chronic kidney disease), stage III (HCC)    Coronary artery disease    a.) LHC 12/24/2014 -> small LAD system; diffuse CTO of LAD. 60% RCA. LM and LCx with no sig disease. no intervention; med mgmt.   Degenerative arthritis    Diastolic dysfunction    a.) TTE 09/20/2018: EF 55%, G1DD, mild LAE and RVE, triv-mild panvalvular regurgitation.   Dysarthria    GERD (gastroesophageal reflux disease)    Hepatic steatosis    History of kidney stones    HLD (hyperlipidemia)    Hypertension    Kidney stone    Lumbar radiculopathy    Major depression in remission ([place])    PVD (peripheral vascular disease) ([place])    Renal cyst, right 06/02/2015   a.) CT 06/02/2015 --> 4.4 cm RIGHT renal mass; MRI recommended. b.) MRI 06/16/2015 --> 3.8 x 4.2 x 4.0 cm proteinaceous/hemmorhagic cyst.   Subclavian steal syndrome    a.) LHC 12/24/2014 --> tight eccentric 90% ostial stenosis with damping.   T2DM (type 2 diabetes mellitus) [facility])    Past Surgical History:  Procedure Laterality  Date   ABDOMINAL HYSTERECTOMY     AUGMENTATION MAMMAPLASTY Bilateral 1980   BACK SURGERY  09/2018   BICEPT TENODESIS  06/10/2021   Procedure: BICEPS TENODESIS;  Surgeon: Corky Mull, MD;  Location: [facility];  Service: Orthopedics;;   CARDIAC CATHETERIZATION Left 12/24/2014   Procedure: LEFT HEARD CATHETERIZATION; Location: [facility]; Surgeon: Laurence Aly, MD   CHOLECYSTECTOMY     COLONOSCOPY N/A 02/11/2016   Procedure: COLONOSCOPY;  Surgeon: Manya Silvas, MD;  Location: [facility] ENDOSCOPY;  Service: Endoscopy;  Laterality: N/A;   IR CT HEAD LTD  11/16/2021   IR CT HEAD LTD  11/16/2021   IR INTRAVSC STENT CERV CAROTID W/O EMB-PROT MOD SED INC ANGIO  11/18/2021   IR PERCUTANEOUS ART THROMBECTOMY/INFUSION INTRACRANIAL INC DIAG ANGIO  11/16/2021   RADIOLOGY WITH ANESTHESIA N/A 11/16/2021   Procedure: IR WITH ANESTHESIA- CODE STROKE;  Surgeon: Radiologist, Medication, MD;  Location: [facility];  Service: Radiology;  Laterality: N/A;   REVERSE SHOULDER ARTHROPLASTY Right 06/10/2021   Procedure: REVERSE SHOULDER ARTHROPLASTY;  Surgeon: Corky Mull, MD;  Location: [facility];  Service: Orthopedics;  Laterality: Right;   Patient Active Problem List   Diagnosis Date Noted   Benign hypertension  with CKD (chronic kidney disease) stage III ([place]) 03/02/2022   CAD (coronary artery disease) 03/02/2022   Hypercholesteremia 03/02/2022   Right hemiparesis ([place]) 12/16/2021   Acute ischemic left MCA stroke ([place]) 11/16/2021   Middle cerebral artery embolism, 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   Status post reverse arthroplasty of shoulder, right 06/10/2021   Traumatic complete tear of right rotator cuff 02/26/2021   Major depression in remission ([place]) 06/25/2020   Dysarthria 09/20/2018   Gastroesophageal reflux disease without esophagitis 09/20/2018   Lumbar radiculopathy 09/20/2018   Healthcare maintenance 05/02/2018   Controlled type 2 diabetes mellitus with stage 3 chronic kidney disease, without long-term current use of insulin ([place])  01/30/2018   Chronic bilateral low back pain with left-sided sciatica 12/07/2016   Hepatic steatosis 11/23/2015   Carotid artery disease ([place]) 01/06/2010    ONSET DATE: 11/16/21   REFERRING DIAG: L MCA CVA   HPI: Pt is a 81 y.o. 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 Patient is s/p thrombectomy with left ICA stent placement 11/16/21. She completed inpatient rehabilitation at [facility] 11/25/21-12/07/21 and was discharged home with family. Patient known to this clinic from prior course of voice therapy in 2018. Pt with documented hx of voice and speech changes (dysarthria) per videostroboscopy 12/26/17; did not attend therapy.   THERAPY DIAG:  Dysarthria and anarthria  Aphasia  Rationale for Evaluation and Treatment Rehabilitation  SUBJECTIVE: "How are you doing?"  Pt accompanied by: Cassidy Hernandez, sister  PAIN: No Are you having pain? No     OBJECTIVE:   TODAY'S TREATMENT: SLP faciliated simple to mod complex conversation, average 70dB with occasional min-mod cues for increasing loudness, pausing for increased intelligibility, 85% for this trained, familiar listener. Re-instructed on HEP for dysarthria, with pt requiring usual mod cues/modeling for pitch glides and adduction exercises. Minimal hoarseness/tension today. Push/pull with /a/ avg 81 dB. Pitch glides ("knoll") ranged 163-417 Hz. Mod-max cues for dysarthria comps in paragraph reading.    PATIENT EDUCATION: Education details: sip water vs throat clear Person educated: Patient Education method: Explanation, Demonstration, and Verbal cues Education comprehension: verbalized understanding and needs further education    SLP Short Term Goals - 04/04/22 1541       SLP SHORT TERM GOAL #5   Title Pt will write sentences of 5-7 words 90% accuracy (self correction allowed) with occasional min cues.    Time 10    Period --   sessions   Status Achieved      Additional Short Term Goals   Additional Short  Term Goals Yes      SLP SHORT TERM GOAL #6   Title Pt will use intelligibility strategies (loudness, breath support, overarticulation) for sentence level responses 90% accuracy with min cues.    Time 10    Period --   sessions   Status Achieved      SLP SHORT TERM GOAL #7   Title Pt will use aphasia compensations in 10 minutes moderately complex conversation with fewer than 3 listener questions necessary for clarification of message.    Time 10    Period --   sessions   Status Achieved      SLP SHORT TERM GOAL #8   Title Patient will complete HEP for voice building with rare min cues.    Time 10   sessions   Status New      SLP SHORT TERM GOAL  #9   TITLE Pt will maintain average >72 dB and intelligibility >95%  in 5 minutes conversation.    Time 10   sessions   Status New             SLP Long Term Goals - 04/04/22 1544     SLP LONG TERM GOAL #3   Title Pt will demonstrate reading comprehension of mod complex short story or articles of interest with technology supports (text-to-speech, ereader, etc) by generating summary or answering questions >80% accuracy.    Baseline 02/23/22: Reads/comprehends simple paragraphs unassisted. 04/04/22 comprehends mod complex paragraphs with mod A    Time --    Period Weeks    Status On-going        SLP LONG TERM GOAL #4   Title Pt will self-advocate for communication needs in community or family outings (per her report) to increase confidence in social interactions.    Time 12    Period Weeks    Status On-going      SLP LONG TERM GOAL #5   Title Pt will report reduced frustration with communication breakdowns by implementing aphasia compensation outside [place] room.    Baseline 02/24/22: frustrated 3 times per day on average    Time 12    Period Weeks    Status Achieved      SLP LONG TERM GOAL #6   Title Pt will use intelligibility strategies (loudness, breath support, overarticulation) 80% accuracy in 10 minutes conversation with  modified independence.    Time 12    Period Weeks    Status On-going                   Plan    Clinical Impression Statement Pt continues to improve verbal and written expression, reading comprehension, vocal quality and intelligibility. Aphasia appears clinically more mild in nature. ENT did not find pathology; given prior hx of dysarthria (progressive) prior to CVA, difficult to determine extent of dysarthria progression vs new onset dysarthria secondary to CVA, although pt endorses worsening since CVA. Minimal tension/strain in voice today; intensity based cues and pausing remain most effective strategies. Patient agrees her voice and intelligibility is her greatest concern at this time. Pt is progressing toward goals and benefits from continued skilled ST intervention per plan of care.    Speech Therapy Frequency 2x / week    Duration 2 weeks    Treatment/Interventions SLP instruction and feedback;Multimodal communcation approach;Patient/family education;Functional tasks;Cueing hierarchy;Compensatory techniques;Compensatory strategies    Potential to Achieve Goals Good    Potential Considerations Family/community support    SLP Home Exercise Plan TalkPath Therapy    Consulted and Agree with Plan of Care Patient             Deneise Lever, MS, CCC-SLP Speech-Language Pathologist [phone]

## 2022-04-14 ENCOUNTER — Ambulatory Visit: Payer: Medicare Other

## 2022-04-14 ENCOUNTER — Ambulatory Visit: Payer: Medicare Other | Admitting: Speech Pathology

## 2022-04-14 DIAGNOSIS — R2681 Unsteadiness on feet: Secondary | ICD-10-CM

## 2022-04-14 DIAGNOSIS — R471 Dysarthria and anarthria: Secondary | ICD-10-CM | POA: Diagnosis not present

## 2022-04-14 DIAGNOSIS — R4701 Aphasia: Secondary | ICD-10-CM

## 2022-04-14 DIAGNOSIS — M6281 Muscle weakness (generalized): Secondary | ICD-10-CM

## 2022-04-14 DIAGNOSIS — R269 Unspecified abnormalities of gait and mobility: Secondary | ICD-10-CM

## 2022-04-14 DIAGNOSIS — R262 Difficulty in walking, not elsewhere classified: Secondary | ICD-10-CM

## 2022-04-14 NOTE — Therapy (Signed)
OUTPATIENT SPEECH LANGUAGE PATHOLOGY TREATMENT NOTE  Patient Name: Cassidy Hernandez MRN: [id number] DOB:06/30/1941, 81 y.o., female Today's Date: 04/14/2022    REFERRING PROVIDER: Merrilee Seashore, NP     End of Session - 04/14/22 1307     Visit Number 33    Number of Visits 15    Date for SLP Re-Evaluation 06/14/22    Authorization Type BCBS MCR; $10 copay    Authorization - Visit Number 3    Progress Note Due on Visit 10    SLP Start Time 1300    SLP Stop Time  1400    SLP Time Calculation (min) 60 min    Activity Tolerance Patient tolerated treatment well                 Past Medical History:  Diagnosis Date   Anginal pain ([place])    Aortic atherosclerosis    Cancer ([place])    Carotid artery stenosis 02/01/2010   a.) Doppler 32/35/5732: 20% LICA and 25% RICA. b.) Doppler 42/70/6237: >62% LICA and 83% RICA. c.) Doppler 10/25/16: >70% Bilater ICAs   Chronic bilateral low back pain with left-sided sciatica    CKD (chronic kidney disease), stage III (HCC)    Coronary artery disease    a.) LHC 12/24/2014 -> small LAD system; diffuse CTO of LAD. 60% RCA. LM and LCx with no sig disease. no intervention; med mgmt.   Degenerative arthritis    Diastolic dysfunction    a.) TTE 09/20/2018: EF 55%, G1DD, mild LAE and RVE, triv-mild panvalvular regurgitation.   Dysarthria    GERD (gastroesophageal reflux disease)    Hepatic steatosis    History of kidney stones    HLD (hyperlipidemia)    Hypertension    Kidney stone    Lumbar radiculopathy    Major depression in remission ([place])    PVD (peripheral vascular disease) ([place])    Renal cyst, right 06/02/2015   a.) CT 06/02/2015 --> 4.4 cm RIGHT renal mass; MRI recommended. b.) MRI 06/16/2015 --> 3.8 x 4.2 x 4.0 cm proteinaceous/hemmorhagic cyst.   Subclavian steal syndrome    a.) LHC 12/24/2014 --> tight eccentric 90% ostial stenosis with damping.   T2DM (type 2 diabetes mellitus) [facility])    Past Surgical History:  Procedure Laterality  Date   ABDOMINAL HYSTERECTOMY     AUGMENTATION MAMMAPLASTY Bilateral 1980   BACK SURGERY  09/2018   BICEPT TENODESIS  06/10/2021   Procedure: BICEPS TENODESIS;  Surgeon: Corky Mull, MD;  Location: [facility];  Service: Orthopedics;;   CARDIAC CATHETERIZATION Left 12/24/2014   Procedure: LEFT HEARD CATHETERIZATION; Location: [facility]; Surgeon: Laurence Aly, MD   CHOLECYSTECTOMY     COLONOSCOPY N/A 02/11/2016   Procedure: COLONOSCOPY;  Surgeon: Manya Silvas, MD;  Location: [facility] ENDOSCOPY;  Service: Endoscopy;  Laterality: N/A;   IR CT HEAD LTD  11/16/2021   IR CT HEAD LTD  11/16/2021   IR INTRAVSC STENT CERV CAROTID W/O EMB-PROT MOD SED INC ANGIO  11/18/2021   IR PERCUTANEOUS ART THROMBECTOMY/INFUSION INTRACRANIAL INC DIAG ANGIO  11/16/2021   RADIOLOGY WITH ANESTHESIA N/A 11/16/2021   Procedure: IR WITH ANESTHESIA- CODE STROKE;  Surgeon: Radiologist, Medication, MD;  Location: [facility];  Service: Radiology;  Laterality: N/A;   REVERSE SHOULDER ARTHROPLASTY Right 06/10/2021   Procedure: REVERSE SHOULDER ARTHROPLASTY;  Surgeon: Corky Mull, MD;  Location: [facility];  Service: Orthopedics;  Laterality: Right;   Patient Active Problem List   Diagnosis Date Noted   Benign hypertension  with CKD (chronic kidney disease) stage III ([place]) 03/02/2022   CAD (coronary artery disease) 03/02/2022   Hypercholesteremia 03/02/2022   Right hemiparesis ([place]) 12/16/2021   Acute ischemic left MCA stroke ([place]) 11/16/2021   Middle cerebral artery embolism, 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   Status post reverse arthroplasty of shoulder, right 06/10/2021   Traumatic complete tear of right rotator cuff 02/26/2021   Major depression in remission ([place]) 06/25/2020   Dysarthria 09/20/2018   Gastroesophageal reflux disease without esophagitis 09/20/2018   Lumbar radiculopathy 09/20/2018   Healthcare maintenance 05/02/2018   Controlled type 2 diabetes mellitus with stage 3 chronic kidney disease, without long-term current use of insulin ([place])  01/30/2018   Chronic bilateral low back pain with left-sided sciatica 12/07/2016   Hepatic steatosis 11/23/2015   Carotid artery disease ([place]) 01/06/2010    ONSET DATE: 11/16/21   REFERRING DIAG: L MCA CVA   HPI: Pt is a 81 y.o. 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 Patient is s/p thrombectomy with left ICA stent placement 11/16/21. She completed inpatient rehabilitation at [facility] 11/25/21-12/07/21 and was discharged home with family. Patient known to this clinic from prior course of voice therapy in 2018. Pt with documented hx of voice and speech changes (dysarthria) per videostroboscopy 12/26/17; did not attend therapy.   THERAPY DIAG:  Dysarthria and anarthria  Aphasia  Rationale for Evaluation and Treatment Rehabilitation  SUBJECTIVE: "How are you doing?"  Pt accompanied by: Rod Holler, sister  PAIN: No Are you having pain? No     OBJECTIVE:   TODAY'S TREATMENT: Reinstruction for dysarthria HEP; pitch glides with straw required initial min cues, range 200-381 Hz. Pt generated strain with push/pull exercises, so refocused using straw hum. With focus on vocal effort, pt read phrases average 75 dB with 100% intelligibility, and good vocal quality >95%. Progressed to sentence level responses, average 72 dB with occasional min-mod cues.   PATIENT EDUCATION: Education details: hum/straw to reduce strain Person educated: Patient Education method: Explanation, Demonstration, and Verbal cues Education comprehension: verbalized understanding and needs further education    SLP Short Term Goals - 04/04/22 1541       SLP SHORT TERM GOAL #5   Title Pt will write sentences of 5-7 words 90% accuracy (self correction allowed) with occasional min cues.    Time 10    Period --   sessions   Status Achieved      Additional Short Term Goals   Additional Short Term Goals Yes      SLP SHORT TERM GOAL #6   Title Pt will use intelligibility strategies (loudness,  breath support, overarticulation) for sentence level responses 90% accuracy with min cues.    Time 10    Period --   sessions   Status Achieved      SLP SHORT TERM GOAL #7   Title Pt will use aphasia compensations in 10 minutes moderately complex conversation with fewer than 3 listener questions necessary for clarification of message.    Time 10    Period --   sessions   Status Achieved      SLP SHORT TERM GOAL #8   Title Patient will complete HEP for voice building with rare min cues.    Time 10   sessions   Status New      SLP SHORT TERM GOAL  #9   TITLE Pt will maintain average >72 dB and intelligibility >95%  in 5 minutes conversation.    Time 10  sessions   Status New             SLP Long Term Goals - 04/04/22 1544     SLP LONG TERM GOAL #3   Title Pt will demonstrate reading comprehension of mod complex short story or articles of interest with technology supports (text-to-speech, ereader, etc) by generating summary or answering questions >80% accuracy.    Baseline 02/23/22: Reads/comprehends simple paragraphs unassisted. 04/04/22 comprehends mod complex paragraphs with mod A    Time --    Period Weeks    Status On-going        SLP LONG TERM GOAL #4   Title Pt will self-advocate for communication needs in community or family outings (per her report) to increase confidence in social interactions.    Time 12    Period Weeks    Status On-going      SLP LONG TERM GOAL #5   Title Pt will report reduced frustration with communication breakdowns by implementing aphasia compensation outside [place] room.    Baseline 02/24/22: frustrated 3 times per day on average    Time 12    Period Weeks    Status Achieved      SLP LONG TERM GOAL #6   Title Pt will use intelligibility strategies (loudness, breath support, overarticulation) 80% accuracy in 10 minutes conversation with modified independence.    Time 12    Period Weeks    Status On-going                   Plan     Clinical Impression Statement Pt continues to improve verbal and written expression, reading comprehension, vocal quality and intelligibility. Aphasia appears clinically more mild in nature. ENT did not find pathology; given prior hx of dysarthria (progressive) prior to CVA, difficult to determine extent of dysarthria progression vs new onset dysarthria secondary to CVA, although pt endorses worsening since CVA. Minimal tension/strain in voice today; intensity based cues and pausing remain most effective strategies. Patient agrees her voice and intelligibility is her greatest concern at this time. Pt is progressing toward goals and benefits from continued skilled ST intervention per plan of care.    Speech Therapy Frequency 2x / week    Duration 2 weeks    Treatment/Interventions SLP instruction and feedback;Multimodal communcation approach;Patient/family education;Functional tasks;Cueing hierarchy;Compensatory techniques;Compensatory strategies    Potential to Achieve Goals Good    Potential Considerations Family/community support    SLP Home Exercise Plan TalkPath Therapy    Consulted and Agree with Plan of Care Patient             Deneise Lever, MS, CCC-SLP Speech-Language Pathologist [phone]

## 2022-04-14 NOTE — Therapy (Signed)
OUTPATIENT PHYSICAL THERAPY TREATMENT NOTE  Patient Name: Cassidy Hernandez MRN: [id number] DOB:1941/05/05, 81 y.o., female Today's Date: 04/14/2022  PCP: Kirk Ruths, MD REFERRING PROVIDER: Rayann Heman, NP   PT End of Session - 04/14/22 1511     Visit Number 30    Number of Visits 37    Date for PT Re-Evaluation 05/09/22    Authorization Type BCBS Medicare    Progress Note Due on Visit 30    PT Start Time 1433    PT Stop Time 1513    PT Time Calculation (min) 40 min    Equipment Utilized During Treatment Gait belt    Activity Tolerance Patient tolerated treatment well;No increased pain;Patient limited by fatigue    Behavior During Therapy [facility] for tasks assessed/performed                      Past Medical History:  Diagnosis Date   Anginal pain ([place])    Aortic atherosclerosis    Cancer ([place])    Carotid artery stenosis 02/01/2010   a.) Doppler 72/53/6644: 03% LICA and 47% RICA. b.) Doppler 42/59/5638: >75% LICA and 64% RICA. c.) Doppler 10/25/16: >70% Bilater ICAs   Chronic bilateral low back pain with left-sided sciatica    CKD (chronic kidney disease), stage III (HCC)    Coronary artery disease    a.) LHC 12/24/2014 -> small LAD system; diffuse CTO of LAD. 60% RCA. LM and LCx with no sig disease. no intervention; med mgmt.   Degenerative arthritis    Diastolic dysfunction    a.) TTE 09/20/2018: EF 55%, G1DD, mild LAE and RVE, triv-mild panvalvular regurgitation.   Dysarthria    GERD (gastroesophageal reflux disease)    Hepatic steatosis    History of kidney stones    HLD (hyperlipidemia)    Hypertension    Kidney stone    Lumbar radiculopathy    Major depression in remission ([place])    PVD (peripheral vascular disease) ([employer])    Renal cyst, right 06/02/2015   a.) CT 06/02/2015 --> 4.4 cm RIGHT renal mass; MRI recommended. b.) MRI 06/16/2015 --> 3.8 x 4.2 x 4.0 cm proteinaceous/hemmorhagic cyst.   Subclavian steal syndrome    a.) LHC 12/24/2014 -->  tight eccentric 90% ostial stenosis with damping.   T2DM (type 2 diabetes mellitus) [facility])    Past Surgical History:  Procedure Laterality Date   ABDOMINAL HYSTERECTOMY     AUGMENTATION MAMMAPLASTY Bilateral 1980   BACK SURGERY  09/2018   BICEPT TENODESIS  06/10/2021   Procedure: BICEPS TENODESIS;  Surgeon: Corky Mull, MD;  Location: [facility];  Service: Orthopedics;;   CARDIAC CATHETERIZATION Left 12/24/2014   Procedure: LEFT HEARD CATHETERIZATION; Location: [facility]; Surgeon: Laurence Aly, MD   CHOLECYSTECTOMY     COLONOSCOPY N/A 02/11/2016   Procedure: COLONOSCOPY;  Surgeon: Manya Silvas, MD;  Location: [facility] END[facility]COPY;  Service: Endoscopy;  Laterality: N/A;   IR CT HEAD LTD  11/16/2021   IR CT HEAD LTD  11/16/2021   IR INTRAVSC STENT CERV CAROTID W/O EMB-PROT MOD SED INC ANGIO  11/18/2021   IR PERCUTANEOUS ART THROMBECTOMY/INFUSION INTRACRANIAL INC DIAG ANGIO  11/16/2021   RADIOLOGY WITH ANESTHESIA N/A 11/16/2021   Procedure: IR WITH ANESTHESIA- CODE STROKE;  Surgeon: Radiologist, Medication, MD;  Location: [place];  Service: Radiology;  Laterality: N/A;   REVERSE SHOULDER ARTHROPLASTY Right 06/10/2021   Procedure: REVERSE SHOULDER ARTHROPLASTY;  Surgeon: Corky Mull, MD;  Location: [facility];  Service: Orthopedics;  Laterality: Right;   Patient Active Problem List   Diagnosis Date Noted   Benign hypertension with CKD (chronic kidney disease) stage III ([place]) 03/02/2022   CAD (coronary artery disease) 03/02/2022   Hypercholesteremia 03/02/2022   Right hemiparesis ([place]) 12/16/2021   Acute ischemic left MCA stroke ([place]) 11/16/2021   Middle cerebral artery embolism, 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   Status post reverse arthroplasty of shoulder, right 06/10/2021   Traumatic complete tear of right rotator cuff 02/26/2021   Major depression in remission ([place]) 06/25/2020   Dysarthria 09/20/2018   Gastroesophageal reflux disease without esophagitis 09/20/2018   Lumbar radiculopathy 09/20/2018   Healthcare  maintenance 05/02/2018   Controlled type 2 diabetes mellitus with stage 3 chronic kidney disease, without long-term current use of insulin ([place]) 01/30/2018   Chronic bilateral low back pain with left-sided sciatica 12/07/2016   Hepatic steatosis 11/23/2015   Carotid artery disease ([place]) 01/06/2010    REFERRING DIAG: G18.299 (ICD-10-CM) - Cerebral infarction due to unspecified occlusion or stenosis of left middle cerebral artery  THERAPY DIAG:  Difficulty in walking, not elsewhere classified  Muscle weakness (generalized)  Unsteadiness on feet  Abnormality of gait and mobility  Rationale for Evaluation and Treatment Rehabilitation  PERTINENT HISTORY: Pt recent L MCA, s/p thrombectomy and ICA stenting. Pt with speech deficits. Pt has been somewhat unsteady with a few falls falling backward however DTR reports this is not new, but has been ongoing. Pt seen at our Sports clinic in 2018 for low back pain issues in the setting of spinal stenosis.  PRECAUTIONS: N/A  SUBJECTIVE:  Pt reports her foot pain is a little better today.  She notes that she didn't do much over the weekend.  No falls reported since last visit as well.  PAIN:  None reported today.  TODAY'S TREATMENT:  Seated LAQ 2x15, 3# AW Seated marching 2x15, 3# AW Seated ER/IR 2x15, 3# AW STS from standard chair, no hands, x10 requires verbal and visual cuing for bringing LE's underneath her for ease of transfer Elevated STS 1X5   Standing hip abduction with 3# AW, 2x15 each side Standing hip extension with 3# AW, 2x15 each side Standing hamstring curls with 3# AW, x15 each side  Ambulation around gym with 3# AW donned for increased endurance     PATIENT EDUCATION: Education details: exercise technique, body mechanics Person educated: Patient Education method: Explanation, Demonstration, Tactile cues, and Verbal cues Education comprehension: verbalized understanding, returned demonstration, verbal cues required,  tactile cues required, and needs further education   HOME EXERCISE PROGRAM:  *see prior notes     PT Short Term Goals       PT SHORT TERM GOAL #1   Title Independence in HEP for improved balance and strength    Baseline Issued at eval  8/3: Pt reports she is performing her HEP every other day, not performing all her exercises   Time 3    Period Weeks    Status On-going    Target Date 04/11/2022       PT SHORT TERM GOAL #2   Title 5xSTS hands free <19sec in order to indicate decreased fall risk   Baseline Eval: >24sec hands free; 8/3:  32 sec hands free, CGA   Time 4    Period Weeks    Status Revised    Target Date 04/11/2022       PT SHORT TERM GOAL #3   Title The pt will demonstrate Ankle DF MMT 5/5 bilat for improved  gait mechanics and functional mobility   Baseline Eval: 4+/5 Right, 4/5 Left; 8/3: 4+/5 B   Time 4    Period Weeks    Status Revised     Target Date 04/11/2022               PT Long Term Goals       PT LONG TERM GOAL #1   Title Pt will ambulate at least 1000 ft on the 6MWT for progression to community ambulator and improve gait ability   Baseline July 2019: 1330f; 03/10/22: 580 ft with 4WW   Time 8    Period Weeks    Status Revised    Target Date 05/09/2022       PT LONG TERM GOAL #2   Title Pt will complete 5xSTS <13 sec hands free to indicate decreased fall risk and increased BLE power.   Baseline eval: >24sec hands free; 8/3: 32 sec hands free, CGA   Time 8    Period Weeks    Status Revised    Target Date 05/09/2022       PT LONG TERM GOAL #3   Title Pt to demonstrate retroAMB without device >0.254m s LOB to demonstrate improved ankle righting and control.    Baseline Eval: <0.0135mand c 3 LOB 03/10/22: .08 m/s with 4WW    Time 10    Period Weeks    Status Partially Met   Target Date 06/06/2022      PT LONG TERM GOAL #4   Title The pt FOTO Score will increase by >15 points to indicate improved functional mobility and QOL     Baseline eval: 48; 03/10/2022: 61% (previously 62% on 02/02/22)    Time 12    Period Weeks    Status Achieved (revised to indicate functional purpose of goal)    Target Date              Plan     Clinical Impression Statement Pt continues to put forth good effort throughout the session.  Pt notes she was able to ambulate in the hallway the length x5 prior to therapy due to being so earlier for speech.  Pt somewhat fatigued, but was able to tolerate all the exercises given and continue progress towards goal achievement.  Pt will continue to benefit from skilled therapy to address remaining deficits in order to improve overall QoL and return to PLOF.     Personal Factors and Comorbidities Past/Current Experience    Examination-Activity Limitations Dressing;Transfers;Bend;Locomotion Level;Stairs;Stand    Examination-Participation Restrictions Cleaning;Shop;Meal Prep;YarValla Leaverrk;Laundry    Stability/Clinical Decision Making Evolving/Moderate complexity    Rehab Potential Good    Clinical Impairments Affecting Rehab Potential (+) motivation, social support (-) age, chronicity of pain, fairly sedentary lifestyle    PT Frequency 2x / week    PT Duration 12 weeks    PT Treatment/Interventions Aquatic Therapy;Moist Heat;Functional mobility training;Dry needling;Neuromuscular re-education;Traction;Ultrasound;Iontophoresis 4mg60m Dexamethasone;ADLs/Self Care Home Management;Cryotherapy;Electrical Stimulation;Therapeutic exercise;Therapeutic activities;Patient/family education;Taping;Gait training;Stair training;DME Instruction;Balance training;Passive range of motion    PT Next Visit Plan Progress standing functional interventions as appropriate, updated HEP, assess hip mobility for potential restrictions   PT Home Exercise Plan Eval: seated heel raises, toe raises, and STS from elevated surface. 01/04/2022=Access Code: [email or web address]: [email or web address]    Consulted and Agree with Plan of  Care Patient;Family member/caregiver    Family Member Consulted EarnVerlee Rossetti, [place] 04/14/22, 5:25 PM Physical  Therapist - [place] [phone]

## 2022-04-18 ENCOUNTER — Ambulatory Visit: Payer: Medicare Other

## 2022-04-18 ENCOUNTER — Encounter: Payer: Medicare Other | Admitting: Speech Pathology

## 2022-04-19 ENCOUNTER — Encounter: Payer: Medicare Other | Admitting: Speech Pathology

## 2022-04-19 ENCOUNTER — Ambulatory Visit: Payer: Medicare Other | Admitting: Physical Therapy

## 2022-04-20 ENCOUNTER — Ambulatory Visit: Payer: Medicare Other

## 2022-04-20 ENCOUNTER — Encounter: Payer: Medicare Other | Admitting: Occupational Therapy

## 2022-04-20 ENCOUNTER — Encounter: Payer: Medicare Other | Admitting: Speech Pathology

## 2022-04-21 ENCOUNTER — Ambulatory Visit: Payer: Medicare Other

## 2022-04-21 ENCOUNTER — Encounter: Payer: Medicare Other | Admitting: Speech Pathology

## 2022-04-25 ENCOUNTER — Encounter: Payer: Medicare Other | Admitting: Speech Pathology

## 2022-04-25 ENCOUNTER — Ambulatory Visit: Payer: Medicare Other

## 2022-04-26 ENCOUNTER — Encounter: Payer: Medicare Other | Admitting: Speech Pathology

## 2022-04-26 ENCOUNTER — Ambulatory Visit: Payer: Medicare Other

## 2022-04-26 ENCOUNTER — Ambulatory Visit: Payer: Medicare Other | Admitting: Speech Pathology

## 2022-04-26 DIAGNOSIS — R4701 Aphasia: Secondary | ICD-10-CM

## 2022-04-26 DIAGNOSIS — R278 Other lack of coordination: Secondary | ICD-10-CM

## 2022-04-26 DIAGNOSIS — R2681 Unsteadiness on feet: Secondary | ICD-10-CM

## 2022-04-26 DIAGNOSIS — R262 Difficulty in walking, not elsewhere classified: Secondary | ICD-10-CM

## 2022-04-26 DIAGNOSIS — R269 Unspecified abnormalities of gait and mobility: Secondary | ICD-10-CM

## 2022-04-26 DIAGNOSIS — R2689 Other abnormalities of gait and mobility: Secondary | ICD-10-CM

## 2022-04-26 DIAGNOSIS — R296 Repeated falls: Secondary | ICD-10-CM

## 2022-04-26 DIAGNOSIS — R471 Dysarthria and anarthria: Secondary | ICD-10-CM | POA: Diagnosis not present

## 2022-04-26 DIAGNOSIS — M6281 Muscle weakness (generalized): Secondary | ICD-10-CM

## 2022-04-26 DIAGNOSIS — M79671 Pain in right foot: Secondary | ICD-10-CM

## 2022-04-26 NOTE — Therapy (Signed)
OUTPATIENT PHYSICAL THERAPY TREATMENT NOTE  Patient Name: Cassidy Hernandez MRN: [id number] DOB:04/12/1941, 81 y.o., female Today's Date: 04/26/2022  PCP: Kirk Ruths, MD REFERRING PROVIDER: Rayann Heman, NP   PT End of Session - 04/26/22 1109     Visit Number 31    Number of Visits 37    Date for PT Re-Evaluation 05/09/22    Authorization Type BCBS Medicare    Authorization Time Period 03/14/22-05/09/22    Progress Note Due on Visit 40    PT Start Time 1100    PT Stop Time 1140    PT Time Calculation (min) 40 min    Equipment Utilized During Treatment Gait belt    Activity Tolerance Patient tolerated treatment well;No increased pain;Patient limited by fatigue    Behavior During Therapy [facility] for tasks assessed/performed                 Past Medical History:  Diagnosis Date   Anginal pain ([place])    Aortic atherosclerosis    Cancer ([place])    Carotid artery stenosis 02/01/2010   a.) Doppler 29/79/8921: 19% LICA and 41% RICA. b.) Doppler 74/03/1447: >18% LICA and 56% RICA. c.) Doppler 10/25/16: >70% Bilater ICAs   Chronic bilateral low back pain with left-sided sciatica    CKD (chronic kidney disease), stage III (HCC)    Coronary artery disease    a.) LHC 12/24/2014 -> small LAD system; diffuse CTO of LAD. 60% RCA. LM and LCx with no sig disease. no intervention; med mgmt.   Degenerative arthritis    Diastolic dysfunction    a.) TTE 09/20/2018: EF 55%, G1DD, mild LAE and RVE, triv-mild panvalvular regurgitation.   Dysarthria    GERD (gastroesophageal reflux disease)    Hepatic steatosis    History of kidney stones    HLD (hyperlipidemia)    Hypertension    Kidney stone    Lumbar radiculopathy    Major depression in remission ([place])    PVD (peripheral vascular disease) ([place])    Renal cyst, right 06/02/2015   a.) CT 06/02/2015 --> 4.4 cm RIGHT renal mass; MRI recommended. b.) MRI 06/16/2015 --> 3.8 x 4.2 x 4.0 cm proteinaceous/hemmorhagic cyst.   Subclavian steal  syndrome    a.) LHC 12/24/2014 --> tight eccentric 90% ostial stenosis with damping.   T2DM (type 2 diabetes mellitus) [facility])    Past Surgical History:  Procedure Laterality Date   ABDOMINAL HYSTERECTOMY     AUGMENTATION MAMMAPLASTY Bilateral 1980   BACK SURGERY  09/2018   BICEPT TENODESIS  06/10/2021   Procedure: BICEPS TENODESIS;  Surgeon: Corky Mull, MD;  Location: [facility];  Service: Orthopedics;;   CARDIAC CATHETERIZATION Left 12/24/2014   Procedure: LEFT HEARD CATHETERIZATION; Location: [facility]; Surgeon: Laurence Aly, MD   CHOLECYSTECTOMY     COLONOSCOPY N/A 02/11/2016   Procedure: COLONOSCOPY;  Surgeon: Manya Silvas, MD;  Location: [facility] ENDOSCOPY;  Service: Endoscopy;  Laterality: N/A;   IR CT HEAD LTD  11/16/2021   IR CT HEAD LTD  11/16/2021   IR INTRAVSC STENT CERV CAROTID W/O EMB-PROT MOD SED INC ANGIO  11/18/2021   IR PERCUTANEOUS ART THROMBECTOMY/INFUSION INTRACRANIAL INC DIAG ANGIO  11/16/2021   RADIOLOGY WITH ANESTHESIA N/A 11/16/2021   Procedure: IR WITH ANESTHESIA- CODE STROKE;  Surgeon: Radiologist, Medication, MD;  Location: [place];  Service: Radiology;  Laterality: N/A;   REVERSE SHOULDER ARTHROPLASTY Right 06/10/2021   Procedure: REVERSE SHOULDER ARTHROPLASTY;  Surgeon: Corky Mull, MD;  [facility];  Service: Orthopedics;  Laterality: Right;   Patient Active Problem List   Diagnosis Date Noted   Benign hypertension with CKD (chronic kidney disease) stage III ([place]) 03/02/2022   CAD (coronary artery disease) 03/02/2022   Hypercholesteremia 03/02/2022   Right hemiparesis ([place]) 12/16/2021   Acute ischemic left MCA stroke ([place]) 11/16/2021   Middle cerebral artery embolism, 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   Status post reverse arthroplasty of shoulder, right 06/10/2021   Traumatic complete tear of right rotator cuff 02/26/2021   Major depression in remission ([place]) 06/25/2020   Dysarthria 09/20/2018   Gastroesophageal reflux disease without esophagitis 09/20/2018   Lumbar  radiculopathy 09/20/2018   Healthcare maintenance 05/02/2018   Controlled type 2 diabetes mellitus with stage 3 chronic kidney disease, without long-term current use of insulin ([place]) 01/30/2018   Chronic bilateral low back pain with left-sided sciatica 12/07/2016   Hepatic steatosis 11/23/2015   Carotid artery disease ([place]) 01/06/2010    REFERRING DIAG: E93.810 (ICD-10-CM) - Cerebral infarction due to unspecified occlusion or stenosis of left middle cerebral artery  THERAPY DIAG:  Difficulty in walking, not elsewhere classified  Muscle weakness (generalized)  Unsteadiness on feet  Abnormality of gait and mobility  Other lack of coordination  Other abnormalities of gait and mobility  Repeated falls  Pain in right foot  Rationale for Evaluation and Treatment Rehabilitation  PERTINENT HISTORY: Pt s/p Lt MCA treated with thrombectomy and ICA stenting. Pt has speech and motor deficits deficits. Pt has had unsteadiness and difficulty walking since DC, retropulsive falls. Pt has early foot drop which has contributed to posterior LOB issued, exacerbated by zero drop footwear.  Pt seen at our Sports clinic in 2018 for low back pain issues in the setting of spinal stenosis.  PRECAUTIONS: N/A  SUBJECTIVE:  Pt out of town for her sister last week to [facility] who was sick, passed away. Pt was at podiatrist yesterday who issued an AFO, but pt unable to don successfully today, left it at home.   PAIN:  None reported today.  TODAY'S TREATMENT: Elevated STS from plinth, hands on knees 1x12 Marching c 3lb AW 1x30 Elevated STS from plinth, hands on knees 1x12 Marching c 3lb AW 1x30  Standing hip extension with 3# AW, 2x10 each side Standing heel raises 2x15 c 3# AW bilat   Seated LAQ 1x15 @ 3lb AW bilat Seated GreenTB Clammy Clam 1x15  Seated Yellow TB Reverse Clam 1x15, ball at knees  Seated ankle DF 1.25lb AW   Seated LAQ 1x15 @ 3lb AW bilat Seated GreenTB Clammy Clam 1x15  Seated  Yellow TB Reverse Clam 1x15, ball at knees  Seated ankle DF 1.25lb AW   Backward walking with 4WW    PATIENT EDUCATION: Education details: exercise technique, body mechanics Person educated: Patient Education method: Explanation, Demonstration, Tactile cues, and Verbal cues Education comprehension: verbalized understanding, returned demonstration, verbal cues required, tactile cues required, and needs further education   HOME EXERCISE PROGRAM:  *see prior notes     PT Short Term Goals       PT SHORT TERM GOAL #1   Title Independence in HEP for improved balance and strength    Baseline Issued at eval  8/3: Pt reports she is performing her HEP every other day, not performing all her exercises   Time 3    Period Weeks    Status On-going    Target Date 04/11/2022       PT SHORT TERM GOAL #2   Title 5xSTS  hands free <19sec in order to indicate decreased fall risk   Baseline Eval: >24sec hands free; 8/3:  32 sec hands free, CGA   Time 4    Period Weeks    Status Revised    Target Date 04/11/2022       PT SHORT TERM GOAL #3   Title The pt will demonstrate Ankle DF MMT 5/5 bilat for improved gait mechanics and functional mobility   Baseline Eval: 4+/5 Right, 4/5 Left; 8/3: 4+/5 B   Time 4    Period Weeks    Status Revised     Target Date 04/11/2022               PT Long Term Goals       PT LONG TERM GOAL #1   Title Pt will ambulate at least 1000 ft on the 6MWT for progression to community ambulator and improve gait ability   Baseline July 2019: 1363f; 03/10/22: 580 ft with 4WW   Time 8    Period Weeks    Status Revised    Target Date 05/09/2022       PT LONG TERM GOAL #2   Title Pt will complete 5xSTS <13 sec hands free to indicate decreased fall risk and increased BLE power.   Baseline eval: >24sec hands free; 8/3: 32 sec hands free, CGA   Time 8    Period Weeks    Status Revised    Target Date 05/09/2022       PT LONG TERM GOAL #3   Title Pt to  demonstrate retroAMB without device >0.249m s LOB to demonstrate improved ankle righting and control.    Baseline Eval: <0.0138mand c 3 LOB 03/10/22: .08 m/s with 4WW    Time 10    Period Weeks    Status Partially Met   Target Date 06/06/2022      PT LONG TERM GOAL #4   Title The pt FOTO Score will increase by >15 points to indicate improved functional mobility and QOL    Baseline eval: 48; 03/10/2022: 61% (previously 62% on 02/02/22)    Time 12    Period Weeks    Status Achieved (revised to indicate functional purpose of goal)    Target Date              Plan     Clinical Impression Statement Continued to work on strength training standing balance and multidirectional gait. Pt will continue to benefit from skilled therapy to address remaining deficits in order to improve overall QoL and return to PLOF.     Personal Factors and Comorbidities Past/Current Experience    Examination-Activity Limitations Dressing;Transfers;Bend;Locomotion Level;Stairs;Stand    Examination-Participation Restrictions Cleaning;Shop;Meal Prep;YarValla Leaverrk;Laundry    Stability/Clinical Decision Making Evolving/Moderate complexity    Rehab Potential Good    Clinical Impairments Affecting Rehab Potential (+) motivation, social support (-) age, chronicity of pain, fairly sedentary lifestyle    PT Frequency 2x / week    PT Duration 12 weeks    PT Treatment/Interventions Aquatic Therapy;Moist Heat;Functional mobility training;Dry needling;Neuromuscular re-education;Traction;Ultrasound;Iontophoresis 4mg51m Dexamethasone;ADLs/Self Care Home Management;Cryotherapy;Electrical Stimulation;Therapeutic exercise;Therapeutic activities;Patient/family education;Taping;Gait training;Stair training;DME Instruction;Balance training;Passive range of motion    PT Next Visit Plan Progress standing functional interventions as appropriate, updated HEP, assess hip mobility for potential restrictions   PT Home Exercise Plan Eval: seated  heel raises, toe raises, and STS from elevated surface. 01/04/2022=Access Code: [email or web address]: [email or web address]    Consulted and Agree with Plan of Care Patient;Family member/caregiver  Family Member Consulted Earnestine             04/26/22, 11:12 AM  11:12 AM, 04/26/22 Etta Grandchild, PT, DPT Physical Therapist - [place] [phone]

## 2022-04-26 NOTE — Therapy (Signed)
OUTPATIENT SPEECH LANGUAGE PATHOLOGY TREATMENT NOTE  Patient Name: Cassidy Hernandez MRN: [id number] DOB:1941/07/06, 81 y.o., female Today's Date: 04/26/2022    REFERRING PROVIDER: Merrilee Seashore, NP     End of Session - 04/26/22 1034     Visit Number 34    Number of Visits 20    Date for SLP Re-Evaluation 06/14/22    Authorization Type BCBS MCR; $10 copay    Authorization - Visit Number 4    Progress Note Due on Visit 10    SLP Start Time 1004    SLP Stop Time  1100    SLP Time Calculation (min) 56 min    Activity Tolerance Patient tolerated treatment well                 Past Medical History:  Diagnosis Date   Anginal pain ([place])    Aortic atherosclerosis    Cancer ([place])    Carotid artery stenosis 02/01/2010   a.) Doppler 78/24/2353: 61% LICA and 44% RICA. b.) Doppler 31/54/0086: >76% LICA and 19% RICA. c.) Doppler 10/25/16: >70% Bilater ICAs   Chronic bilateral low back pain with left-sided sciatica    CKD (chronic kidney disease), stage III (HCC)    Coronary artery disease    a.) LHC 12/24/2014 -> small LAD system; diffuse CTO of LAD. 60% RCA. LM and LCx with no sig disease. no intervention; med mgmt.   Degenerative arthritis    Diastolic dysfunction    a.) TTE 09/20/2018: EF 55%, G1DD, mild LAE and RVE, triv-mild panvalvular regurgitation.   Dysarthria    GERD (gastroesophageal reflux disease)    Hepatic steatosis    History of kidney stones    HLD (hyperlipidemia)    Hypertension    Kidney stone    Lumbar radiculopathy    Major depression in remission ([place])    PVD (peripheral vascular disease) ([place])    Renal cyst, right 06/02/2015   a.) CT 06/02/2015 --> 4.4 cm RIGHT renal mass; MRI recommended. b.) MRI 06/16/2015 --> 3.8 x 4.2 x 4.0 cm proteinaceous/hemmorhagic cyst.   Subclavian steal syndrome    a.) LHC 12/24/2014 --> tight eccentric 90% ostial stenosis with damping.   T2DM (type 2 diabetes mellitus) [facility])    Past Surgical History:  Procedure Laterality  Date   ABDOMINAL HYSTERECTOMY     AUGMENTATION MAMMAPLASTY Bilateral 1980   BACK SURGERY  09/2018   BICEPT TENODESIS  06/10/2021   Procedure: BICEPS TENODESIS;  Surgeon: Corky Mull, MD;  Location: [facility];  Service: Orthopedics;;   CARDIAC CATHETERIZATION Left 12/24/2014   Procedure: LEFT HEARD CATHETERIZATION; Location: [facility]; Surgeon: Laurence Aly, MD   CHOLECYSTECTOMY     COLONOSCOPY N/A 02/11/2016   Procedure: COLONOSCOPY;  Surgeon: Manya Silvas, MD;  Location: [facility] ENDOSCOPY;  Service: Endoscopy;  Laterality: N/A;   IR CT HEAD LTD  11/16/2021   IR CT HEAD LTD  11/16/2021   IR INTRAVSC STENT CERV CAROTID W/O EMB-PROT MOD SED INC ANGIO  11/18/2021   IR PERCUTANEOUS ART THROMBECTOMY/INFUSION INTRACRANIAL INC DIAG ANGIO  11/16/2021   RADIOLOGY WITH ANESTHESIA N/A 11/16/2021   Procedure: IR WITH ANESTHESIA- CODE STROKE;  Surgeon: Radiologist, Medication, MD;  Location: [facility];  Service: Radiology;  Laterality: N/A;   REVERSE SHOULDER ARTHROPLASTY Right 06/10/2021   Procedure: REVERSE SHOULDER ARTHROPLASTY;  Surgeon: Corky Mull, MD;  Location: [facility];  Service: Orthopedics;  Laterality: Right;   Patient Active Problem List   Diagnosis Date Noted   Benign hypertension  with CKD (chronic kidney disease) stage III ([place]) 03/02/2022   CAD (coronary artery disease) 03/02/2022   Hypercholesteremia 03/02/2022   Right hemiparesis ([place]) 12/16/2021   Acute ischemic left MCA stroke ([place]) 11/16/2021   Middle cerebral artery embolism, 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   Status post reverse arthroplasty of shoulder, right 06/10/2021   Traumatic complete tear of right rotator cuff 02/26/2021   Major depression in remission ([place]) 06/25/2020   Dysarthria 09/20/2018   Gastroesophageal reflux disease without esophagitis 09/20/2018   Lumbar radiculopathy 09/20/2018   Healthcare maintenance 05/02/2018   Controlled type 2 diabetes mellitus with stage 3 chronic kidney disease, without long-term current use of insulin ([place])  01/30/2018   Chronic bilateral low back pain with left-sided sciatica 12/07/2016   Hepatic steatosis 11/23/2015   Carotid artery disease ([place]) 01/06/2010    ONSET DATE: 11/16/21   REFERRING DIAG: L MCA CVA   HPI: Pt is a 81 y.o. 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 Patient is s/p thrombectomy with left ICA stent placement 11/16/21. She completed inpatient rehabilitation at [facility] 11/25/21-12/07/21 and was discharged home with family. Patient known to this clinic from prior course of voice therapy in 2018. Pt with documented hx of voice and speech changes (dysarthria) per videostroboscopy 12/26/17; did not attend therapy.   THERAPY DIAG:  Dysarthria and anarthria  Aphasia  Rationale for Evaluation and Treatment Rehabilitation  SUBJECTIVE: "It was good," re communication with in-laws during trip last week.   Pt accompanied by: Rod Holler, sister  PAIN: No Are you having pain? No     OBJECTIVE:   TODAY'S TREATMENT: Discussed pt goals, progress and plan of care; pt in agreement with decreasing frequency to 1x per week beginning in October, with tentative d/c date set for 11/2. Pt expressed that she no longer wants to target reading: "I'm not interested in it anymore." SLP focused session on vocal quality and intelligibility. Patient required mod cues with her home exercises today, having missed a week of therapy for traveling last week. Avg dB with intensity exercises (push/pull with /a/) was 80 dB, pitch range for glides 94-493 Hz. For carryover of loudness in conversation, patient required occasional mod cues.  PATIENT EDUCATION: Education details: hum/straw to reduce strain Person educated: Patient Education method: Explanation, Demonstration, and Verbal cues Education comprehension: verbalized understanding and needs further education    SLP Short Term Goals - 04/04/22 1541       SLP SHORT TERM GOAL #5   Title Pt will write sentences of 5-7 words 90%  accuracy (self correction allowed) with occasional min cues.    Time 10    Period --   sessions   Status Achieved      Additional Short Term Goals   Additional Short Term Goals Yes      SLP SHORT TERM GOAL #6   Title Pt will use intelligibility strategies (loudness, breath support, overarticulation) for sentence level responses 90% accuracy with min cues.    Time 10    Period --   sessions   Status Achieved      SLP SHORT TERM GOAL #7   Title Pt will use aphasia compensations in 10 minutes moderately complex conversation with fewer than 3 listener questions necessary for clarification of message.    Time 10    Period --   sessions   Status Achieved      SLP SHORT TERM GOAL #8   Title Patient will complete HEP for voice building with  rare min cues.    Time 10   sessions   Status New      SLP SHORT TERM GOAL  #9   TITLE Pt will maintain average >72 dB and intelligibility >95%  in 5 minutes conversation.    Time 10   sessions   Status New             SLP Long Term Goals - 04/04/22 1544     SLP LONG TERM GOAL #3   Title Pt will demonstrate reading comprehension of mod complex short story or articles of interest with technology supports (text-to-speech, ereader, etc) by generating summary or answering questions >80% accuracy.    Baseline 02/23/22: Reads/comprehends simple paragraphs unassisted. 04/04/22 comprehends mod complex paragraphs with mod A    Time --    Period Weeks    Status Deferred 04/26/22 per pt request     SLP LONG TERM GOAL #4   Title Pt will self-advocate for communication needs in community or family outings (per her report) to increase confidence in social interactions.    Time 12    Period Weeks    Status On-going      SLP LONG TERM GOAL #5   Title Pt will report reduced frustration with communication breakdowns by implementing aphasia compensation outside [place] room.    Baseline 02/24/22: frustrated 3 times per day on average    Time 12    Period Weeks     Status Achieved      SLP LONG TERM GOAL #6   Title Pt will use intelligibility strategies (loudness, breath support, overarticulation) 80% accuracy in 10 minutes conversation with modified independence.    Time 12    Period Weeks    Status On-going                   Plan    Clinical Impression Statement Pt continues to improve verbal and written expression, reading comprehension, vocal quality and intelligibility. Aphasia appears clinically more mild in nature. ENT did not find pathology; given prior hx of dysarthria (progressive) prior to CVA, difficult to determine extent of dysarthria progression vs new onset dysarthria secondary to CVA, although pt endorses worsening since CVA. Minimal tension/strain in voice today; intensity based cues and pausing remain most effective strategies. Patient agrees her voice and intelligibility is her greatest concern at this time. Pt is progressing toward goals and benefits from continued skilled ST intervention per plan of care.    Speech Therapy Frequency 2x / week    Duration 2 weeks    Treatment/Interventions SLP instruction and feedback;Multimodal communcation approach;Patient/family education;Functional tasks;Cueing hierarchy;Compensatory techniques;Compensatory strategies    Potential to Achieve Goals Good    Potential Considerations Family/community support    SLP Home Exercise Plan TalkPath Therapy    Consulted and Agree with Plan of Care Patient             Deneise Lever, MS, CCC-SLP Speech-Language Pathologist [phone]

## 2022-04-27 ENCOUNTER — Ambulatory Visit: Payer: Medicare Other

## 2022-04-27 ENCOUNTER — Encounter: Payer: Medicare Other | Admitting: Speech Pathology

## 2022-04-28 ENCOUNTER — Ambulatory Visit: Payer: Medicare Other | Admitting: Speech Pathology

## 2022-04-28 ENCOUNTER — Ambulatory Visit: Payer: Medicare Other

## 2022-04-28 DIAGNOSIS — R471 Dysarthria and anarthria: Secondary | ICD-10-CM

## 2022-04-28 DIAGNOSIS — R2681 Unsteadiness on feet: Secondary | ICD-10-CM

## 2022-04-28 DIAGNOSIS — R262 Difficulty in walking, not elsewhere classified: Secondary | ICD-10-CM

## 2022-04-28 DIAGNOSIS — R4701 Aphasia: Secondary | ICD-10-CM

## 2022-04-28 DIAGNOSIS — M79671 Pain in right foot: Secondary | ICD-10-CM

## 2022-04-28 DIAGNOSIS — R296 Repeated falls: Secondary | ICD-10-CM

## 2022-04-28 DIAGNOSIS — R278 Other lack of coordination: Secondary | ICD-10-CM

## 2022-04-28 DIAGNOSIS — R2689 Other abnormalities of gait and mobility: Secondary | ICD-10-CM

## 2022-04-28 DIAGNOSIS — M6281 Muscle weakness (generalized): Secondary | ICD-10-CM

## 2022-04-28 DIAGNOSIS — R269 Unspecified abnormalities of gait and mobility: Secondary | ICD-10-CM

## 2022-04-28 NOTE — Therapy (Signed)
OUTPATIENT PHYSICAL THERAPY TREATMENT NOTE  Patient Name: Cassidy Hernandez MRN: [id number] DOB:1941/02/10, 81 y.o., female Today's Date: 04/28/2022  PCP: Kirk Ruths, MD REFERRING PROVIDER: Rayann Heman, NP   PT End of Session - 04/28/22 1126     Visit Number 32    Number of Visits 37    Date for PT Re-Evaluation 05/09/22    Authorization Type BCBS Medicare    Authorization Time Period 03/14/22-05/09/22    Progress Note Due on Visit 40    PT Start Time 1100    PT Stop Time 1140    PT Time Calculation (min) 40 min    Equipment Utilized During Treatment Gait belt    Activity Tolerance Patient tolerated treatment well    Behavior During Therapy [facility] for tasks assessed/performed                 Past Medical History:  Diagnosis Date   Anginal pain ([place])    Aortic atherosclerosis    Cancer ([place])    Carotid artery stenosis 02/01/2010   a.) Doppler 53/74/8270: 78% LICA and 67% RICA. b.) Doppler 54/49/2010: >07% LICA and 12% RICA. c.) Doppler 10/25/16: >70% Bilater ICAs   Chronic bilateral low back pain with left-sided sciatica    CKD (chronic kidney disease), stage III ([place])    Coronary artery disease    a.) LHC 12/24/2014 -> small LAD system; diffuse CTO of LAD. 60% RCA. LM and LCx with no sig disease. no intervention; med mgmt.   Degenerative arthritis    Diastolic dysfunction    a.) TTE 09/20/2018: EF 55%, G1DD, mild LAE and RVE, triv-mild panvalvular regurgitation.   Dysarthria    GERD (gastroesophageal reflux disease)    Hepatic steatosis    History of kidney stones    HLD (hyperlipidemia)    Hypertension    Kidney stone    Lumbar radiculopathy    Major depression in remission ([place])    PVD (peripheral vascular disease) ([place])    Renal cyst, right 06/02/2015   a.) CT 06/02/2015 --> 4.4 cm RIGHT renal mass; MRI recommended. b.) MRI 06/16/2015 --> 3.8 x 4.2 x 4.0 cm proteinaceous/hemmorhagic cyst.   Subclavian steal syndrome    a.) LHC 12/24/2014 --> tight  eccentric 90% ostial stenosis with damping.   T2DM (type 2 diabetes mellitus) [facility])    Past Surgical History:  Procedure Laterality Date   ABDOMINAL HYSTERECTOMY     AUGMENTATION MAMMAPLASTY Bilateral 1980   BACK SURGERY  09/2018   BICEPT TENODESIS  06/10/2021   Procedure: BICEPS TENODESIS;  Surgeon: Corky Mull, MD;  Location: [facility];  Service: Orthopedics;;   CARDIAC CATHETERIZATION Left 12/24/2014   Procedure: LEFT HEARD CATHETERIZATION; Location: [facility]; Surgeon: Laurence Aly, MD   CHOLECYSTECTOMY     COLONOSCOPY N/A 02/11/2016   Procedure: COLONOSCOPY;  Surgeon: Manya Silvas, MD;  Location: [facility] ENDOSCOPY;  Service: Endoscopy;  Laterality: N/A;   IR CT HEAD LTD  11/16/2021   IR CT HEAD LTD  11/16/2021   IR INTRAVSC STENT CERV CAROTID W/O EMB-PROT MOD SED INC ANGIO  11/18/2021   IR PERCUTANEOUS ART THROMBECTOMY/INFUSION INTRACRANIAL INC DIAG ANGIO  11/16/2021   RADIOLOGY WITH ANESTHESIA N/A 11/16/2021   Procedure: IR WITH ANESTHESIA- CODE STROKE;  Surgeon: Radiologist, Medication, MD;  Location: [facility];  Service: Radiology;  Laterality: N/A;   REVERSE SHOULDER ARTHROPLASTY Right 06/10/2021   Procedure: REVERSE SHOULDER ARTHROPLASTY;  Surgeon: Corky Mull, MD;  Location: [facility];  Service: Orthopedics;  Laterality: Right;   Patient Active Problem List   Diagnosis Date Noted   Benign hypertension with CKD (chronic kidney disease) stage III ([place]) 03/02/2022   CAD (coronary artery disease) 03/02/2022   Hypercholesteremia 03/02/2022   Right hemiparesis ([place]) 12/16/2021   Acute ischemic left MCA stroke ([place]) 11/16/2021   Middle cerebral artery embolism, 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   Status post reverse arthroplasty of shoulder, right 06/10/2021   Traumatic complete tear of right rotator cuff 02/26/2021   Major depression in remission ([place]) 06/25/2020   Dysarthria 09/20/2018   Gastroesophageal reflux disease without esophagitis 09/20/2018   Lumbar radiculopathy 09/20/2018   Healthcare  maintenance 05/02/2018   Controlled type 2 diabetes mellitus with stage 3 chronic kidney disease, without long-term current use of insulin ([place]) 01/30/2018   Chronic bilateral low back pain with left-sided sciatica 12/07/2016   Hepatic steatosis 11/23/2015   Carotid artery disease ([place]) 01/06/2010    REFERRING DIAG: T64.353 (ICD-10-CM) - Cerebral infarction due to unspecified occlusion or stenosis of left middle cerebral artery  THERAPY DIAG:  Dysarthria and anarthria  Aphasia  Difficulty in walking, not elsewhere classified  Muscle weakness (generalized)  Unsteadiness on feet  Abnormality of gait and mobility  Other lack of coordination  Other abnormalities of gait and mobility  Repeated falls  Pain in right foot  Rationale for Evaluation and Treatment Rehabilitation  PERTINENT HISTORY: Pt s/p Lt MCA treated with thrombectomy and ICA stenting. Pt has speech and motor deficits deficits. Pt has had unsteadiness and difficulty walking since DC, retropulsive falls. Pt has early foot drop which has contributed to posterior LOB issued, exacerbated by zero drop footwear.  Pt seen at our Sports clinic in 2018 for low back pain issues in the setting of spinal stenosis.  PRECAUTIONS: N/A  SUBJECTIVE:  Brought her new AFO, needs help with donning education. Has a family gathering coming up that will require 14 steps. Wants to know when PT will be done with, speech is ending soon.    PAIN:  None reported today.  TODAY'S TREATMENT: -education on donning new ankle brace from podiatrist  -sustained AMB 661f with brace, 4WW (notable reduction in fatigue related foot drag)  -stairs training 8x up and down with 2 rails; 4x with 1 rail + 1 SPC  *step over step ascending, step-to gait descending  -STS from low surface (15 inches) hands ad lib for push off 3x5     PATIENT EDUCATION: Education details: exercise technique, body mechanics Person educated: Patient Education method:  Explanation, Demonstration, Tactile cues, and Verbal cues Education comprehension: verbalized understanding, returned demonstration, verbal cues required, tactile cues required, and needs further education   HOME EXERCISE PROGRAM:  *see prior notes     PT Short Term Goals       PT SHORT TERM GOAL #1   Title Independence in HEP for improved balance and strength    Baseline Issued at eval  8/3: Pt reports she is performing her HEP every other day, not performing all her exercises   Time 3    Period Weeks    Status On-going    Target Date 04/11/2022       PT SHORT TERM GOAL #2   Title 5xSTS hands free <19sec in order to indicate decreased fall risk   Baseline Eval: >24sec hands free; 8/3:  32 sec hands free, CGA   Time 4    Period Weeks    Status Revised    Target Date 04/11/2022  PT SHORT TERM GOAL #3   Title The pt will demonstrate Ankle DF MMT 5/5 bilat for improved gait mechanics and functional mobility   Baseline Eval: 4+/5 Right, 4/5 Left; 8/3: 4+/5 B   Time 4    Period Weeks    Status Revised     Target Date 04/11/2022               PT Long Term Goals       PT LONG TERM GOAL #1   Title Pt will ambulate at least 1000 ft on the 6MWT for progression to community ambulator and improve gait ability   Baseline July 2019: 1381f; 03/10/22: 580 ft with 4WW   Time 8    Period Weeks    Status Revised    Target Date 05/09/2022       PT LONG TERM GOAL #2   Title Pt will complete 5xSTS <13 sec hands free to indicate decreased fall risk and increased BLE power.   Baseline eval: >24sec hands free; 8/3: 32 sec hands free, CGA   Time 8    Period Weeks    Status Revised    Target Date 05/09/2022       PT LONG TERM GOAL #3   Title Pt to demonstrate retroAMB without device >0.253m s LOB to demonstrate improved ankle righting and control.    Baseline Eval: <0.011mand c 3 LOB 03/10/22: .08 m/s with 4WW    Time 10    Period Weeks    Status Partially Met   Target  Date 06/06/2022      PT LONG TERM GOAL #4   Title The pt FOTO Score will increase by >15 points to indicate improved functional mobility and QOL    Baseline eval: 48; 03/10/2022: 61% (previously 62% on 02/02/22)    Time 12    Period Weeks    Status Achieved (revised to indicate functional purpose of goal)    Target Date              Plan     Clinical Impression Statement Education on new brace today from podiatrist, appears to help with foot drop issue. Pt assessed over longer gait, as well as stair, which appear safe and well managed albeit labored. Discussed a path to DC- pt's functional measures have been in plateau for a while. Pt asks about transition to AMB without 4WW- author advises against this, unsafe and walking ha snot improved enough to warrant any soon prognosis for this. Pt can be DC in 1-2 session as soon as pt/caregiver report success in don/doff brace at home. Pt will continue to benefit from skilled therapy to address remaining deficits in order to improve overall QoL and return to PLOF.     Personal Factors and Comorbidities Past/Current Experience    Examination-Activity Limitations Dressing;Transfers;Bend;Locomotion Level;Stairs;Stand    Examination-Participation Restrictions Cleaning;Shop;Meal Prep;[place];Laundry    Stability/Clinical Decision Making Evolving/Moderate complexity    Rehab Potential Good    Clinical Impairments Affecting Rehab Potential (+) motivation, social support (-) age, chronicity of pain, fairly sedentary lifestyle    PT Frequency 2x / week    PT Duration 12 weeks    PT Treatment/Interventions Aquatic Therapy;Moist Heat;Functional mobility training;Dry needling;Neuromuscular re-education;Traction;Ultrasound;Iontophoresis 4mg72m Dexamethasone;ADLs/Self Care Home Management;Cryotherapy;Electrical Stimulation;Therapeutic exercise;Therapeutic activities;Patient/family education;Taping;Gait training;Stair training;DME Instruction;Balance  training;Passive range of motion    PT Next Visit Plan Progress standing functional interventions as appropriate, updated HEP, assess hip mobility for potential restrictions   PT Home Exercise Plan  Eval: seated heel raises, toe raises, and STS from elevated surface. 01/04/2022=Access Code: [email or web address]  URL: [email or web address]    Consulted and Agree with Plan of Care Patient;Family member/caregiver    Family Member Consulted [place]             04/28/22, 11:39 AM  11:39 AM, 04/28/22 Etta Grandchild, PT, DPT Physical Therapist - [place] [phone]

## 2022-04-28 NOTE — Therapy (Signed)
OUTPATIENT SPEECH LANGUAGE PATHOLOGY TREATMENT NOTE  Patient Name: Cassidy Hernandez MRN: [id number] DOB:1941/01/27, 81 y.o., female Today's Date: 04/28/2022    REFERRING PROVIDER: Merrilee Seashore, NP     End of Session - 04/28/22 1012     Visit Number 35    Number of Visits 69    Date for SLP Re-Evaluation 06/14/22    Authorization Type BCBS MCR; $10 copay    Authorization - Visit Number 5    Progress Note Due on Visit 10    SLP Start Time 1004    SLP Stop Time  1100    SLP Time Calculation (min) 56 min    Activity Tolerance Patient tolerated treatment well                 Past Medical History:  Diagnosis Date   Anginal pain ([place])    Aortic atherosclerosis    Cancer ([place])    Carotid artery stenosis 02/01/2010   a.) Doppler 93/26/7124: 58% LICA and 09% RICA. b.) Doppler 98/33/8250: >53% LICA and 97% RICA. c.) Doppler 10/25/16: >70% Bilater ICAs   Chronic bilateral low back pain with left-sided sciatica    CKD (chronic kidney disease), stage III (HCC)    Coronary artery disease    a.) LHC 12/24/2014 -> small LAD system; diffuse CTO of LAD. 60% RCA. LM and LCx with no sig disease. no intervention; med mgmt.   Degenerative arthritis    Diastolic dysfunction    a.) TTE 09/20/2018: EF 55%, G1DD, mild LAE and RVE, triv-mild panvalvular regurgitation.   Dysarthria    GERD (gastroesophageal reflux disease)    Hepatic steatosis    History of kidney stones    HLD (hyperlipidemia)    Hypertension    Kidney stone    Lumbar radiculopathy    Major depression in remission ([place])    PVD (peripheral vascular disease) ([place])    Renal cyst, right 06/02/2015   a.) CT 06/02/2015 --> 4.4 cm RIGHT renal mass; MRI recommended. b.) MRI 06/16/2015 --> 3.8 x 4.2 x 4.0 cm proteinaceous/hemmorhagic cyst.   Subclavian steal syndrome    a.) LHC 12/24/2014 --> tight eccentric 90% ostial stenosis with damping.   T2DM (type 2 diabetes mellitus) [facility])    Past Surgical History:  Procedure Laterality  Date   ABDOMINAL HYSTERECTOMY     AUGMENTATION MAMMAPLASTY Bilateral 1980   BACK SURGERY  09/2018   BICEPT TENODESIS  06/10/2021   Procedure: BICEPS TENODESIS;  Surgeon: Corky Mull, MD;  Location: [place] [facility];  Service: Orthopedics;;   CARDIAC CATHETERIZATION Left 12/24/2014   Procedure: LEFT HEARD CATHETERIZATION; Location: [facility]; Surgeon: Laurence Aly, MD   CHOLECYSTECTOMY     COLONOSCOPY N/A 02/11/2016   Procedure: COLONOSCOPY;  Surgeon: Manya Silvas, MD;  Location[facility] ENDOSCOPY;  Service: Endoscopy;  Laterality: N/A;   IR CT HEAD LTD  11/16/2021   IR CT HEAD LTD  11/16/2021   IR INTRAVSC STENT CERV CAROTID W/O EMB-PROT MOD SED INC ANGIO  11/18/2021   IR PERCUTANEOUS ART THROMBECTOMY/INFUSION INTRACRANIAL INC DIAG ANGIO  11/16/2021   RADIOLOGY WITH ANESTHESIA N/A 11/16/2021   Procedure: IR WITH ANESTHESIA- CODE STROKE;  Surgeon: Radiologist, Medication, MD;  Location: [place];  Service: Radiology;  Laterality: N/A;   REVERSE SHOULDER ARTHROPLASTY Right 06/10/2021   Procedure: REVERSE SHOULDER ARTHROPLASTY;  Surgeon: Corky Mull, MD;  Location: [facility];  Service: Orthopedics;  Laterality: Right;   Patient Active Problem List   Diagnosis Date Noted   Benign hypertension  with CKD (chronic kidney disease) stage III ([place]) 03/02/2022   CAD (coronary artery disease) 03/02/2022   Hypercholesteremia 03/02/2022   Right hemiparesis ([place]) 12/16/2021   Acute ischemic left MCA stroke ([place]) 11/16/2021   Middle cerebral artery embolism, 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   Status post reverse arthroplasty of shoulder, right 06/10/2021   Traumatic complete tear of right rotator cuff 02/26/2021   Major depression in remission ([employer]) 06/25/2020   Dysarthria 09/20/2018   Gastroesophageal reflux disease without esophagitis 09/20/2018   Lumbar radiculopathy 09/20/2018   Healthcare maintenance 05/02/2018   Controlled type 2 diabetes mellitus with stage 3 chronic kidney disease, without long-term current use of insulin ([place])  01/30/2018   Chronic bilateral low back pain with left-sided sciatica 12/07/2016   Hepatic steatosis 11/23/2015   Carotid artery disease ([place]) 01/06/2010    ONSET DATE: 11/16/21   REFERRING DIAG: L MCA CVA   HPI: Pt is a 81 y.o. 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 Patient is s/p thrombectomy with left ICA stent placement 11/16/21. She completed inpatient rehabilitation at [facility] 11/25/21-12/07/21 and was discharged home with family. Patient known to this clinic from prior course of voice therapy in 2018. Pt with documented hx of voice and speech changes (dysarthria) per videostroboscopy 12/26/17; did not attend therapy.   THERAPY DIAG:  Dysarthria and anarthria  Aphasia  Rationale for Evaluation and Treatment Rehabilitation  SUBJECTIVE: "Sometimes it's not as good" AS:[id number]  Pt accompanied by: Rod Holler, sister  PAIN: No Are you having pain? No     OBJECTIVE:   TODAY'S TREATMENT:  SLP facilitated pt completion of voice HEP with min cues, avg 82 dB for push/pull with /a/, pitch glides 187- 471 Hz. Pt used same intensity level with /a/ with mod cues reading 2-3 sentence paragraphs, with occasional mod cues for pausing and adequate breath support to increase intensity and intelligibility.  PATIENT EDUCATION: Education details: breath support, pausing Person educated: Patient Education method: Explanation, Demonstration, and Verbal cues Education comprehension: verbalized understanding and needs further education    SLP Short Term Goals - 04/04/22 1541       SLP SHORT TERM GOAL #5   Title Pt will write sentences of 5-7 words 90% accuracy (self correction allowed) with occasional min cues.    Time 10    Period --   sessions   Status Achieved      Additional Short Term Goals   Additional Short Term Goals Yes      SLP SHORT TERM GOAL #6   Title Pt will use intelligibility strategies (loudness, breath support, overarticulation) for sentence level  responses 90% accuracy with min cues.    Time 10    Period --   sessions   Status Achieved      SLP SHORT TERM GOAL #7   Title Pt will use aphasia compensations in 10 minutes moderately complex conversation with fewer than 3 listener questions necessary for clarification of message.    Time 10    Period --   sessions   Status Achieved      SLP SHORT TERM GOAL #8   Title Patient will complete HEP for voice building with rare min cues.    Time 10   sessions   Status New      SLP SHORT TERM GOAL  #9   TITLE Pt will maintain average >72 dB and intelligibility >95%  in 5 minutes conversation.    Time 10   sessions   Status  New             SLP Long Term Goals - 04/04/22 1544     SLP LONG TERM GOAL #3   Title Pt will demonstrate reading comprehension of mod complex short story or articles of interest with technology supports (text-to-speech, ereader, etc) by generating summary or answering questions >80% accuracy.    Baseline 02/23/22: Reads/comprehends simple paragraphs unassisted. 04/04/22 comprehends mod complex paragraphs with mod A    Time --    Period Weeks    Status Deferred 04/26/22 per pt request     SLP LONG TERM GOAL #4   Title Pt will self-advocate for communication needs in community or family outings (per her report) to increase confidence in social interactions.    Time 12    Period Weeks    Status On-going      SLP LONG TERM GOAL #5   Title Pt will report reduced frustration with communication breakdowns by implementing aphasia compensation outside [place] room.    Baseline 02/24/22: frustrated 3 times per day on average    Time 12    Period Weeks    Status Achieved      SLP LONG TERM GOAL #6   Title Pt will use intelligibility strategies (loudness, breath support, overarticulation) 80% accuracy in 10 minutes conversation with modified independence.    Time 12    Period Weeks    Status On-going                   Plan    Clinical Impression Statement  Pt continues to improve verbal and written expression, reading comprehension, vocal quality and intelligibility. Aphasia appears clinically more mild in nature. Occasional mod cues for voice quality (intensity, pausing for breath) in structured paragraph level task today. Minimal tension/strain in voice today; intensity based cues and pausing remain most effective strategies. Patient agrees her voice and intelligibility is her greatest concern at this time. Pt is progressing toward goals and benefits from continued skilled ST intervention per plan of care.    Speech Therapy Frequency 2x / week    Duration 2 weeks    Treatment/Interventions SLP instruction and feedback;Multimodal communcation approach;Patient/family education;Functional tasks;Cueing hierarchy;Compensatory techniques;Compensatory strategies    Potential to Achieve Goals Good    Potential Considerations Family/community support    SLP Home Exercise Plan TalkPath Therapy    Consulted and Agree with Plan of Care Patient             Deneise Lever, MS, CCC-SLP Speech-Language Pathologist [phone]

## 2022-05-02 ENCOUNTER — Ambulatory Visit: Payer: Medicare Other | Admitting: Speech Pathology

## 2022-05-02 ENCOUNTER — Ambulatory Visit: Payer: Medicare Other

## 2022-05-02 ENCOUNTER — Encounter: Payer: Medicare Other | Admitting: Speech Pathology

## 2022-05-02 DIAGNOSIS — R4701 Aphasia: Secondary | ICD-10-CM

## 2022-05-02 DIAGNOSIS — R471 Dysarthria and anarthria: Secondary | ICD-10-CM | POA: Diagnosis not present

## 2022-05-02 NOTE — Therapy (Signed)
OUTPATIENT SPEECH LANGUAGE PATHOLOGY TREATMENT NOTE  Patient Name: Cassidy Hernandez MRN: [id number] DOB:23-May-1941, 81 y.o., female Today's Date: 05/02/2022    REFERRING PROVIDER: Merrilee Seashore, NP     End of Session - 05/02/22 1020     Visit Number 36    Number of Visits 51    Date for SLP Re-Evaluation 06/14/22    Authorization Type BCBS MCR; $10 copay    Authorization - Visit Number 6    Progress Note Due on Visit 10    SLP Start Time 1018    SLP Stop Time  1100    SLP Time Calculation (min) 42 min    Activity Tolerance Patient tolerated treatment well                 Past Medical History:  Diagnosis Date   Anginal pain ([place])    Aortic atherosclerosis    Cancer ([place])    Carotid artery stenosis 02/01/2010   a.) Doppler 40/98/1191: 47% LICA and 82% RICA. b.) Doppler 95/62/1308: >65% LICA and 78% RICA. c.) Doppler 10/25/16: >70% Bilater ICAs   Chronic bilateral low back pain with left-sided sciatica    CKD (chronic kidney disease), stage III (HCC)    Coronary artery disease    a.) LHC 12/24/2014 -> small LAD system; diffuse CTO of LAD. 60% RCA. LM and LCx with no sig disease. no intervention; med mgmt.   Degenerative arthritis    Diastolic dysfunction    a.) TTE 09/20/2018: EF 55%, G1DD, mild LAE and RVE, triv-mild panvalvular regurgitation.   Dysarthria    GERD (gastroesophageal reflux disease)    Hepatic steatosis    History of kidney stones    HLD (hyperlipidemia)    Hypertension    Kidney stone    Lumbar radiculopathy    Major depression in remission ([place])    PVD (peripheral vascular disease) ([place])    Renal cyst, right 06/02/2015   a.) CT 06/02/2015 --> 4.4 cm RIGHT renal mass; MRI recommended. b.) MRI 06/16/2015 --> 3.8 x 4.2 x 4.0 cm proteinaceous/hemmorhagic cyst.   Subclavian steal syndrome    a.) LHC 12/24/2014 --> tight eccentric 90% ostial stenosis with damping.   T2DM (type 2 diabetes mellitus) [facility])    Past Surgical History:  Procedure Laterality  Date   ABDOMINAL HYSTERECTOMY     AUGMENTATION MAMMAPLASTY Bilateral 1980   BACK SURGERY  09/2018   BICEPT TENODESIS  06/10/2021   Procedure: BICEPS TENODESIS;  Surgeon: Corky Mull, MD;  Location: [facility];  Service: Orthopedics;;   CARDIAC CATHETERIZATION Left 12/24/2014   Procedure: LEFT HEARD CATHETERIZATION; Location: [facility]; Surgeon: Laurence Aly, MD   CHOLECYSTECTOMY     COLONOSCOPY N/A 02/11/2016   Procedure: COLONOSCOPY;  Surgeon: Manya Silvas, MD;  Location: [facility] ENDOSCOPY;  Service: Endoscopy;  Laterality: N/A;   IR CT HEAD LTD  11/16/2021   IR CT HEAD LTD  11/16/2021   IR INTRAVSC STENT CERV CAROTID W/O EMB-PROT MOD SED INC ANGIO  11/18/2021   IR PERCUTANEOUS ART THROMBECTOMY/INFUSION INTRACRANIAL INC DIAG ANGIO  11/16/2021   RADIOLOGY WITH ANESTHESIA N/A 11/16/2021   Procedure: IR WITH ANESTHESIA- CODE STROKE;  Surgeon: Radiologist, Medication, MD;  Location: [place];  Service: Radiology;  Laterality: N/A;   REVERSE SHOULDER ARTHROPLASTY Right 06/10/2021   Procedure: REVERSE SHOULDER ARTHROPLASTY;  Surgeon: Corky Mull, MD;  Location: [facility];  Service: Orthopedics;  Laterality: Right;   Patient Active Problem List   Diagnosis Date Noted   Benign hypertension  with CKD (chronic kidney disease) stage III ([place]) 03/02/2022   CAD (coronary artery disease) 03/02/2022   Hypercholesteremia 03/02/2022   Right hemiparesis ([place]) 12/16/2021   Acute ischemic left MCA stroke ([place]) 11/16/2021   Middle cerebral artery embolism, 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   Status post reverse arthroplasty of shoulder, right 06/10/2021   Traumatic complete tear of right rotator cuff 02/26/2021   Major depression in remission ([place]) 06/25/2020   Dysarthria 09/20/2018   Gastroesophageal reflux disease without esophagitis 09/20/2018   Lumbar radiculopathy 09/20/2018   Healthcare maintenance 05/02/2018   Controlled type 2 diabetes mellitus with stage 3 chronic kidney disease, without long-term current use of insulin ([place])  01/30/2018   Chronic bilateral low back pain with left-sided sciatica 12/07/2016   Hepatic steatosis 11/23/2015   Carotid artery disease ([place]) 01/06/2010    ONSET DATE: 11/16/21   REFERRING DIAG: L MCA CVA   HPI: Pt is a 81 y.o. 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 Patient is s/p thrombectomy with left ICA stent placement 11/16/21. She completed inpatient rehabilitation at [facility] 11/25/21-12/07/21 and was discharged home with family. Patient known to this clinic from prior course of voice therapy in 2018. Pt with documented hx of voice and speech changes (dysarthria) per videostroboscopy 12/26/17; did not attend therapy.   THERAPY DIAG:  Dysarthria and anarthria  Aphasia  Rationale for Evaluation and Treatment Rehabilitation  SUBJECTIVE: Pt required cues for taking bigger steps when walking to and from [place] room.  Pt accompanied by: Rod Holler, sister  PAIN: No Are you having pain? No     OBJECTIVE:   TODAY'S TREATMENT: Pt entered treatment room with low vocal intensity (avg 65 dB). SLP used home exercises to promote pt's awareness of loudness and to increase vocal effort. Pushing/pulling with /a/ average 81 dB (occasional min-mod cues necessary). Pt required usual mod verbal cues to use appropriate effort with short structured responses as well as off the cuff responses. Pt/sister report she is not completing exercises at home. Reviewed goals with pt and educated that home practice necessary for her to achieve carryover; if practice does not increase/level of cuing does not decrease, will consider further reducing frequency and possible d/c.   PATIENT EDUCATION: Education details: need to practice outside [place] for maximum benefit/carryover Person educated: Patient Education method: Explanation, Demonstration, and Verbal cues Education comprehension: verbalized understanding and needs further education    SLP Short Term Goals - 04/04/22 1541       SLP SHORT  TERM GOAL #5   Title Pt will write sentences of 5-7 words 90% accuracy (self correction allowed) with occasional min cues.    Time 10    Period --   sessions   Status Achieved      Additional Short Term Goals   Additional Short Term Goals Yes      SLP SHORT TERM GOAL #6   Title Pt will use intelligibility strategies (loudness, breath support, overarticulation) for sentence level responses 90% accuracy with min cues.    Time 10    Period --   sessions   Status Achieved      SLP SHORT TERM GOAL #7   Title Pt will use aphasia compensations in 10 minutes moderately complex conversation with fewer than 3 listener questions necessary for clarification of message.    Time 10    Period --   sessions   Status Achieved      SLP SHORT TERM GOAL #8   Title Patient  will complete HEP for voice building with rare min cues.    Time 10   sessions   Status New      SLP SHORT TERM GOAL  #9   TITLE Pt will maintain average >72 dB and intelligibility >95%  in 5 minutes conversation.    Time 10   sessions   Status New             SLP Long Term Goals - 04/04/22 1544     SLP LONG TERM GOAL #3   Title Pt will demonstrate reading comprehension of mod complex short story or articles of interest with technology supports (text-to-speech, ereader, etc) by generating summary or answering questions >80% accuracy.    Baseline 02/23/22: Reads/comprehends simple paragraphs unassisted. 04/04/22 comprehends mod complex paragraphs with mod A    Time --    Period Weeks    Status Deferred 04/26/22 per pt request     SLP LONG TERM GOAL #4   Title Pt will self-advocate for communication needs in community or family outings (per her report) to increase confidence in social interactions.    Time 12    Period Weeks    Status On-going      SLP LONG TERM GOAL #5   Title Pt will report reduced frustration with communication breakdowns by implementing aphasia compensation outside [place] room.    Baseline 02/24/22:  frustrated 3 times per day on average    Time 12    Period Weeks    Status Achieved      SLP LONG TERM GOAL #6   Title Pt will use intelligibility strategies (loudness, breath support, overarticulation) 80% accuracy in 10 minutes conversation with modified independence.    Time 12    Period Weeks    Status On-going                   Plan    Clinical Impression Statement Pt continues to improve verbal and written expression, reading comprehension, vocal quality and intelligibility. Aphasia appears clinically more mild in nature. Pt continues to require mod cues for voice quality (intensity, pausing for breath) in structured  tasks today; if this does not improve may consider further decreasing frequency (plan to drop to 1x per week beginning next week. Intensity based cues and pausing remain most effective strategies. Patient agrees her voice and intelligibility is her greatest concern at this time. Pt is progressing toward goals and benefits from continued skilled ST intervention per plan of care.    Speech Therapy Frequency 2x / week    Duration 2 weeks    Treatment/Interventions SLP instruction and feedback;Multimodal communcation approach;Patient/family education;Functional tasks;Cueing hierarchy;Compensatory techniques;Compensatory strategies    Potential to Achieve Goals Good    Potential Considerations Family/community support    SLP Home Exercise Plan TalkPath Therapy    Consulted and Agree with Plan of Care Patient             Deneise Lever, MS, CCC-SLP Speech-Language Pathologist [phone]

## 2022-05-03 ENCOUNTER — Ambulatory Visit: Payer: Medicare Other

## 2022-05-04 ENCOUNTER — Ambulatory Visit: Payer: Medicare Other | Admitting: Speech Pathology

## 2022-05-04 ENCOUNTER — Ambulatory Visit: Payer: Medicare Other | Admitting: Physical Therapy

## 2022-05-04 ENCOUNTER — Ambulatory Visit: Payer: Medicare Other

## 2022-05-04 ENCOUNTER — Encounter: Payer: Medicare Other | Admitting: Speech Pathology

## 2022-05-04 DIAGNOSIS — R471 Dysarthria and anarthria: Secondary | ICD-10-CM

## 2022-05-04 DIAGNOSIS — R4701 Aphasia: Secondary | ICD-10-CM

## 2022-05-04 NOTE — Therapy (Signed)
OUTPATIENT SPEECH LANGUAGE PATHOLOGY TREATMENT NOTE  Patient Name: Cassidy Hernandez MRN: [id number] DOB:08-Nov-1940, 81 y.o., female Today's Date: 05/04/2022    REFERRING PROVIDER: Merrilee Seashore, NP     End of Session - 05/04/22 1013     Visit Number 37    Number of Visits 39    Date for SLP Re-Evaluation 06/14/22    Authorization Type BCBS MCR; $10 copay    Authorization - Visit Number 7    Progress Note Due on Visit 10    SLP Start Time 1007    SLP Stop Time  1100    SLP Time Calculation (min) 53 min    Activity Tolerance Patient tolerated treatment well                 Past Medical History:  Diagnosis Date   Anginal pain ([place])    Aortic atherosclerosis    Cancer ([place])    Carotid artery stenosis 02/01/2010   a.) Doppler 50/04/3817: 29% LICA and 93% RICA. b.) Doppler 71/69/6789: >38% LICA and 10% RICA. c.) Doppler 10/25/16: >70% Bilater ICAs   Chronic bilateral low back pain with left-sided sciatica    CKD (chronic kidney disease), stage III (HCC)    Coronary artery disease    a.) LHC 12/24/2014 -> small LAD system; diffuse CTO of LAD. 60% RCA. LM and LCx with no sig disease. no intervention; med mgmt.   Degenerative arthritis    Diastolic dysfunction    a.) TTE 09/20/2018: EF 55%, G1DD, mild LAE and RVE, triv-mild panvalvular regurgitation.   Dysarthria    GERD (gastroesophageal reflux disease)    Hepatic steatosis    History of kidney stones    HLD (hyperlipidemia)    Hypertension    Kidney stone    Lumbar radiculopathy    Major depression in remission ([place])    PVD (peripheral vascular disease) ([place])    Renal cyst, right 06/02/2015   a.) CT 06/02/2015 --> 4.4 cm RIGHT renal mass; MRI recommended. b.) MRI 06/16/2015 --> 3.8 x 4.2 x 4.0 cm proteinaceous/hemmorhagic cyst.   Subclavian steal syndrome    a.) LHC 12/24/2014 --> tight eccentric 90% ostial stenosis with damping.   T2DM (type 2 diabetes mellitus) [facility])    Past Surgical History:  Procedure Laterality  Date   ABDOMINAL HYSTERECTOMY     AUGMENTATION MAMMAPLASTY Bilateral 1980   BACK SURGERY  09/2018   BICEPT TENODESIS  06/10/2021   Procedure: BICEPS TENODESIS;  Surgeon: Corky Mull, MD;  Location: [facility];  Service: Orthopedics;;   CARDIAC CATHETERIZATION Left 12/24/2014   Procedure: LEFT HEARD CATHETERIZATION; Location: [facility]; Surgeon: Laurence Aly, MD   CHOLECYSTECTOMY     COLONOSCOPY N/A 02/11/2016   Procedure: COLONOSCOPY;  Surgeon: Manya Silvas, MD;  Location: [facility]COPY;  Service: Endoscopy;  Laterality: N/A;   IR CT HEAD LTD  11/16/2021   IR CT HEAD LTD  11/16/2021   IR INTRAVSC STENT CERV CAROTID W/O EMB-PROT MOD SED INC ANGIO  11/18/2021   IR PERCUTANEOUS ART THROMBECTOMY/INFUSION INTRACRANIAL INC DIAG ANGIO  11/16/2021   RADIOLOGY WITH ANESTHESIA N/A 11/16/2021   Procedure: IR WITH ANESTHESIA- CODE STROKE;  Surgeon: Radiologist, Medication, MD;  Location: [facility];  Service: Radiology;  Laterality: N/A;   REVERSE SHOULDER ARTHROPLASTY Right 06/10/2021   Procedure: REVERSE SHOULDER ARTHROPLASTY;  Surgeon: Corky Mull, MD;  Location: [facility];  Service: Orthopedics;  Laterality: Right;   Patient Active Problem List   Diagnosis Date Noted   Benign hypertension  with CKD (chronic kidney disease) stage III ([place]) 03/02/2022   CAD (coronary artery disease) 03/02/2022   Hypercholesteremia 03/02/2022   Right hemiparesis ([place]) 12/16/2021   Acute ischemic left MCA stroke ([place]) 11/16/2021   Middle cerebral artery embolism, 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   Status post reverse arthroplasty of shoulder, right 06/10/2021   Traumatic complete tear of right rotator cuff 02/26/2021   Major depression in remission ([place]) 06/25/2020   Dysarthria 09/20/2018   Gastroesophageal reflux disease without esophagitis 09/20/2018   Lumbar radiculopathy 09/20/2018   Healthcare maintenance 05/02/2018   Controlled type 2 diabetes mellitus with stage 3 chronic kidney disease, without long-term current use of insulin ([place])  01/30/2018   Chronic bilateral low back pain with left-sided sciatica 12/07/2016   Hepatic steatosis 11/23/2015   Carotid artery disease ([place]) 01/06/2010    ONSET DATE: 11/16/21   REFERRING DIAG: L MCA CVA   HPI: Pt is a 81 y.o. 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 Patient is s/p thrombectomy with left ICA stent placement 11/16/21. She completed inpatient rehabilitation at [facility] 11/25/21-12/07/21 and was discharged home with family. Patient known to this clinic from prior course of voice therapy in 2018. Pt with documented hx of voice and speech changes (dysarthria) per videostroboscopy 12/26/17; did not attend therapy.   THERAPY DIAG:  Dysarthria and anarthria  Aphasia  Rationale for Evaluation and Treatment Rehabilitation  SUBJECTIVE: Patient reports fall backwards on Monday at home; was able to get up with assistance from sister.   Pt accompanied by: Rod Holler, sister  PAIN: No Are you having pain? No     OBJECTIVE:   TODAY'S TREATMENT: Voice HEP with min cues today; averaged 80 dB for sustained /a/, MPT 12 seconds. Push/pull with /a/ avg 82 dB. Pitch glides ranged 104-473 Hz. Used visual scale/reminders for effort level when speaking. Patient averaged 70 dB in structured tasks, required occasional mod verbal and gestural cues to maintain loudness in 1-3 sentence responses. Placed reminder on walker for effort level.   PATIENT EDUCATION: Education details: need to practice outside [place] for maximum benefit/carryover Person educated: Patient Education method: Explanation, Demonstration, and Verbal cues Education comprehension: verbalized understanding and needs further education    SLP Short Term Goals - 04/04/22 1541       SLP SHORT TERM GOAL #5   Title Pt will write sentences of 5-7 words 90% accuracy (self correction allowed) with occasional min cues.    Time 10    Period --   sessions   Status Achieved      Additional Short Term Goals    Additional Short Term Goals Yes      SLP SHORT TERM GOAL #6   Title Pt will use intelligibility strategies (loudness, breath support, overarticulation) for sentence level responses 90% accuracy with min cues.    Time 10    Period --   sessions   Status Achieved      SLP SHORT TERM GOAL #7   Title Pt will use aphasia compensations in 10 minutes moderately complex conversation with fewer than 3 listener questions necessary for clarification of message.    Time 10    Period --   sessions   Status Achieved      SLP SHORT TERM GOAL #8   Title Patient will complete HEP for voice building with rare min cues.    Time 10   sessions   Status New      SLP SHORT TERM GOAL  #9  TITLE Pt will maintain average >72 dB and intelligibility >95%  in 5 minutes conversation.    Time 10   sessions   Status New             SLP Long Term Goals - 04/04/22 1544     SLP LONG TERM GOAL #3   Title Pt will demonstrate reading comprehension of mod complex short story or articles of interest with technology supports (text-to-speech, ereader, etc) by generating summary or answering questions >80% accuracy.    Baseline 02/23/22: Reads/comprehends simple paragraphs unassisted. 04/04/22 comprehends mod complex paragraphs with mod A    Time --    Period Weeks    Status Deferred 04/26/22 per pt request     SLP LONG TERM GOAL #4   Title Pt will self-advocate for communication needs in community or family outings (per her report) to increase confidence in social interactions.    Time 12    Period Weeks    Status On-going      SLP LONG TERM GOAL #5   Title Pt will report reduced frustration with communication breakdowns by implementing aphasia compensation outside [place] room.    Baseline 02/24/22: frustrated 3 times per day on average    Time 12    Period Weeks    Status Achieved      SLP LONG TERM GOAL #6   Title Pt will use intelligibility strategies (loudness, breath support, overarticulation) 80% accuracy  in 10 minutes conversation with modified independence.    Time 12    Period Weeks    Status On-going                   Plan    Clinical Impression Statement Pt continues with mild aphasia and mild-moderate dysarthria. Aphasia appears clinically more mild in nature. Pt benefits from visual reminders for intensity in structured tasks today. Pt in agreement with plan to drop to 1x per week beginning next week, with d/c at end of October. Intensity based cues and pausing remain most effective strategies. Patient agrees her voice and intelligibility is her greatest concern at this time. Pt is progressing toward goals and benefits from continued skilled ST intervention per plan of care.    Speech Therapy Frequency 2x / week    Duration 2 weeks    Treatment/Interventions SLP instruction and feedback;Multimodal communcation approach;Patient/family education;Functional tasks;Cueing hierarchy;Compensatory techniques;Compensatory strategies    Potential to Achieve Goals Good    Potential Considerations Family/community support    SLP Home Exercise Plan TalkPath Therapy    Consulted and Agree with Plan of Care Patient             Deneise Lever, MS, CCC-SLP Speech-Language Pathologist [phone]

## 2022-05-06 ENCOUNTER — Ambulatory Visit: Payer: Medicare Other | Admitting: Physical Therapy

## 2022-05-09 ENCOUNTER — Ambulatory Visit: Payer: Medicare Other

## 2022-05-09 ENCOUNTER — Encounter: Payer: Medicare Other | Admitting: Speech Pathology

## 2022-05-11 ENCOUNTER — Ambulatory Visit: Payer: Medicare Other

## 2022-05-11 ENCOUNTER — Ambulatory Visit: Payer: Medicare Other | Admitting: Speech Pathology

## 2022-05-11 ENCOUNTER — Encounter: Payer: Medicare Other | Admitting: Speech Pathology

## 2022-05-11 ENCOUNTER — Ambulatory Visit: Payer: Medicare Other | Attending: Nurse Practitioner

## 2022-05-11 DIAGNOSIS — R278 Other lack of coordination: Secondary | ICD-10-CM | POA: Insufficient documentation

## 2022-05-11 DIAGNOSIS — R262 Difficulty in walking, not elsewhere classified: Secondary | ICD-10-CM | POA: Insufficient documentation

## 2022-05-11 DIAGNOSIS — R2681 Unsteadiness on feet: Secondary | ICD-10-CM | POA: Insufficient documentation

## 2022-05-11 DIAGNOSIS — R296 Repeated falls: Secondary | ICD-10-CM | POA: Diagnosis present

## 2022-05-11 DIAGNOSIS — R269 Unspecified abnormalities of gait and mobility: Secondary | ICD-10-CM | POA: Insufficient documentation

## 2022-05-11 DIAGNOSIS — R2689 Other abnormalities of gait and mobility: Secondary | ICD-10-CM | POA: Insufficient documentation

## 2022-05-11 DIAGNOSIS — R4701 Aphasia: Secondary | ICD-10-CM | POA: Diagnosis present

## 2022-05-11 DIAGNOSIS — M6281 Muscle weakness (generalized): Secondary | ICD-10-CM | POA: Insufficient documentation

## 2022-05-11 DIAGNOSIS — M79671 Pain in right foot: Secondary | ICD-10-CM | POA: Diagnosis present

## 2022-05-11 DIAGNOSIS — R471 Dysarthria and anarthria: Secondary | ICD-10-CM | POA: Insufficient documentation

## 2022-05-11 DIAGNOSIS — I63512 Cerebral infarction due to unspecified occlusion or stenosis of left middle cerebral artery: Secondary | ICD-10-CM | POA: Insufficient documentation

## 2022-05-11 NOTE — Therapy (Signed)
OUTPATIENT PHYSICAL THERAPY TREATMENT NOTE/RECERTIFICATION  Patient Name: Cassidy Hernandez MRN: [id number] DOB:10-Aug-1940, 81 y.o., female Today's Date: 05/11/2022  PCP: Kirk Ruths, MD REFERRING PROVIDER: Rayann Heman, NP   PT End of Session - 05/11/22 1103     Visit Number 33    Number of Visits 57    Date for PT Re-Evaluation 08/03/22    Authorization Type BCBS Medicare    Authorization Time Period 03/14/22-05/09/22; Recert 90/09/4095- 35/32/9924    Progress Note Due on Visit 40    PT Start Time 1019    PT Stop Time 1100    PT Time Calculation (min) 41 min    Equipment Utilized During Treatment Gait belt    Activity Tolerance Patient tolerated treatment well    Behavior During Therapy WFL for tasks assessed/performed                  Past Medical History:  Diagnosis Date   Anginal pain ([place])    Aortic atherosclerosis    Cancer ([place])    Carotid artery stenosis 02/01/2010   a.) Doppler 26/83/4196: 22% LICA and 29% RICA. b.) Doppler 79/89/2119: >41% LICA and 74% RICA. c.) Doppler 10/25/16: >70% Bilater ICAs   Chronic bilateral low back pain with left-sided sciatica    CKD (chronic kidney disease), stage III (HCC)    Coronary artery disease    a.) LHC 12/24/2014 -> small LAD system; diffuse CTO of LAD. 60% RCA. LM and LCx with no sig disease. no intervention; med mgmt.   Degenerative arthritis    Diastolic dysfunction    a.) TTE 09/20/2018: EF 55%, G1DD, mild LAE and RVE, triv-mild panvalvular regurgitation.   Dysarthria    GERD (gastroesophageal reflux disease)    Hepatic steatosis    History of kidney stones    HLD (hyperlipidemia)    Hypertension    Kidney stone    Lumbar radiculopathy    Major depression in remission ([place])    PVD (peripheral vascular disease) ([place])    Renal cyst, right 06/02/2015   a.) CT 06/02/2015 --> 4.4 cm RIGHT renal mass; MRI recommended. b.) MRI 06/16/2015 --> 3.8 x 4.2 x 4.0 cm proteinaceous/hemmorhagic cyst.   Subclavian  steal syndrome    a.) LHC 12/24/2014 --> tight eccentric 90% ostial stenosis with damping.   T2DM (type 2 diabetes mellitus) [facility])    Past Surgical History:  Procedure Laterality Date   ABDOMINAL HYSTERECTOMY     AUGMENTATION MAMMAPLASTY Bilateral 1980   BACK SURGERY  09/2018   BICEPT TENODESIS  06/10/2021   Procedure: BICEPS TENODESIS;  Surgeon: Corky Mull, MD;  Location: [facility];  Service: Orthopedics;;   CARDIAC CATHETERIZATION Left 12/24/2014   Procedure: LEFT HEARD CATHETERIZATION; Location: [facility]; Surgeon: Laurence Aly, MD   CHOLECYSTECTOMY     COLONOSCOPY N/A 02/11/2016   Procedure: COLONOSCOPY;  Surgeon: Manya Silvas, MD;  Location: [facility]COPY;  Service: Endoscopy;  Laterality: N/A;   IR CT HEAD LTD  11/16/2021   IR CT HEAD LTD  11/16/2021   IR INTRAVSC STENT CERV CAROTID W/O EMB-PROT MOD SED INC ANGIO  11/18/2021   IR PERCUTANEOUS ART THROMBECTOMY/INFUSION INTRACRANIAL INC DIAG ANGIO  11/16/2021   RADIOLOGY WITH ANESTHESIA N/A 11/16/2021   Procedure: IR WITH ANESTHESIA- CODE STROKE;  Surgeon: Radiologist, Medication, MD;  Location: [facility];  Service: Radiology;  Laterality: N/A;   REVERSE SHOULDER ARTHROPLASTY Right 06/10/2021   Procedure: REVERSE SHOULDER ARTHROPLASTY;  Surgeon: Corky Mull, MD;  Location: [facility];  Service: Orthopedics;  Laterality: Right;   Patient Active Problem List   Diagnosis Date Noted   Benign hypertension with CKD (chronic kidney disease) stage III ([place]) 03/02/2022   CAD (coronary artery disease) 03/02/2022   Hypercholesteremia 03/02/2022   Right hemiparesis ([place]) 12/16/2021   Acute ischemic left MCA stroke ([place]) 11/16/2021   Middle cerebral artery embolism, 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   Status post reverse arthroplasty of shoulder, right 06/10/2021   Traumatic complete tear of right rotator cuff 02/26/2021   Major depression in remission ([place]) 06/25/2020   Dysarthria 09/20/2018   Gastroesophageal reflux disease without esophagitis 09/20/2018   Lumbar  radiculopathy 09/20/2018   Healthcare maintenance 05/02/2018   Controlled type 2 diabetes mellitus with stage 3 chronic kidney disease, without long-term current use of insulin ([place]) 01/30/2018   Chronic bilateral low back pain with left-sided sciatica 12/07/2016   Hepatic steatosis 11/23/2015   Carotid artery disease ([place]) 01/06/2010    REFERRING DIAG: T70.017 (ICD-10-CM) - Cerebral infarction due to unspecified occlusion or stenosis of left middle cerebral artery  THERAPY DIAG:  Difficulty in walking, not elsewhere classified  Muscle weakness (generalized)  Unsteadiness on feet  Abnormality of gait and mobility  Other lack of coordination  Other abnormalities of gait and mobility  Repeated falls  Pain in right foot  Acute ischemic left MCA stroke ([place])  Rationale for Evaluation and Treatment Rehabilitation  PERTINENT HISTORY: Pt s/p Lt MCA treated with thrombectomy and ICA stenting. Pt has speech and motor deficits deficits. Pt has had unsteadiness and difficulty walking since DC, retropulsive falls. Pt has early foot drop which has contributed to posterior LOB issued, exacerbated by zero drop footwear.  Pt seen at our [facility] in 2018 for low back pain issues in the setting of spinal stenosis.  PRECAUTIONS: N/A  SUBJECTIVE:  Patient reports having some issues donning her new AFO and 5th toe rubbing some on shoe- Has bandaid over currently.    PAIN:  None reported today.  TODAY'S TREATMENT:  Reassessed all goals for recert visit- Please refer to goal section for details.    PATIENT EDUCATION: Education details: exercise technique, body mechanics Person educated: Patient Education method: Explanation, Demonstration, Tactile cues, and Verbal cues Education comprehension: verbalized understanding, returned demonstration, verbal cues required, tactile cues required, and needs further education   HOME EXERCISE PROGRAM:  *see prior notes     PT Short Term  Goals       PT SHORT TERM GOAL #1   Title Independence in HEP for improved balance and strength    Baseline Issued at eval  8/3: Pt reports she is performing her HEP every other day, not performing all her exercises; 05/11/2022= Patient reports performing her exercises and trying to walk more at home but admits to not always being compliant.    Time 6   Period Weeks    Status On-going    Target Date 05/22/2022      PT SHORT TERM GOAL #2   Title 5xSTS hands free <19sec in order to indicate decreased fall risk   Baseline Eval: >24sec hands free; 8/3:  32 sec hands free, CGA   Time 4    Period Weeks    Status Revised    Target Date 04/11/2022       PT SHORT TERM GOAL #3   Title The pt will demonstrate Ankle DF MMT 5/5 bilat for improved gait mechanics and functional mobility   Baseline Eval: 4+/5 Right, 4/5 Left; 8/3: 4+/5 B  Time 4    Period Weeks    Status Revised     Target Date 04/11/2022     PT SHORT TERM GOAL #4  Title The pt with help of caregiver will demonstrate don/doff right ankle brace/support independently without significant difficulty.   Baseline   Time 4   Period Weeks   Status Initial   Target Date 05/30/2022             PT Long Term Goals       PT LONG TERM GOAL #1   Title Pt will ambulate at least 1000 ft on the 6MWT for progression to community ambulator and improve gait ability   Baseline July 2019: 1363f; 03/10/22: 580 ft with 4WW; 05/11/2022= 618 feet    Time 12    Period Weeks    Status  ONGOING   Target Date 08/03/2022       PT LONG TERM GOAL #2   Title Pt will complete 5xSTS <13 sec hands free to indicate decreased fall risk and increased BLE power.   Baseline eval: >24sec hands free; 8/3: 32 sec hands free, CGA; 05/11/2022= 19 sec    Time 12   Period Weeks    Status ONGOING   Target Date 08/03/2022       PT LONG TERM GOAL #3   Title Pt to demonstrate retroAMB without device >0.252m s LOB to demonstrate improved ankle righting and  control.    Baseline Eval: <0.0175mand c 3 LOB 03/10/22: .08 m/s with 4WW; 05/11/2022= 0.02 m/s with 4WW   Time 112   Period Weeks    Status ONGOING   Target Date 08/03/2022      PT LONG TERM GOAL #4   Title The pt FOTO Score will increase by >15 points to indicate improved functional mobility and QOL    Baseline eval: 48; 03/10/2022: 61% (previously 62% on 02/02/22)    Time 12    Period Weeks    Status Achieved (revised to indicate functional purpose of goal)    Target Date              Plan     Clinical Impression Statement Patient presents with some improvement with testing for recert visit. She already demonstrated improved ability to negotiate steps last visit and today - demonstrated improved mobility with 6 min walk test today. She also demonstrated progress with 5x STS and independent with transfers. She continues to use walker for now for max safety and still getting use to her new ankle brace. She has potential to continue to improve her mobility and ultimately safety.  Pt will continue to benefit from skilled therapy to address remaining deficits in order to improve overall QoL and return to PLOF.     Personal Factors and Comorbidities Past/Current Experience    Examination-Activity Limitations Dressing;Transfers;Bend;Locomotion Level;Stairs;Stand    Examination-Participation Restrictions Cleaning;Shop;Meal Prep;[place];Laundry    Stability/Clinical Decision Making Evolving/Moderate complexity    Rehab Potential Good    Clinical Impairments Affecting Rehab Potential (+) motivation, social support (-) age, chronicity of pain, fairly sedentary lifestyle    PT Frequency 2x / week    PT Duration 12 weeks    PT Treatment/Interventions Aquatic Therapy;Moist Heat;Functional mobility training;Dry needling;Neuromuscular re-education;Traction;Ultrasound;Iontophoresis '4mg'$ /ml Dexamethasone;ADLs/Self Care Home Management;Cryotherapy;Electrical Stimulation;Therapeutic exercise;Therapeutic  activities;Patient/family education;Taping;Gait training;Stair training;DME Instruction;Balance training;Passive range of motion    PT Next Visit Plan Progress standing functional interventions as appropriate, updated HEP, assess hip mobility for potential restrictions   PT Home Exercise  Plan Eval: seated heel raises, toe raises, and STS from elevated surface. 01/04/2022=Access Code: [email or web address]  URL: [email or web address]    Consulted and Agree with Plan of Care Patient;Family member/caregiver    Family Member Consulted [place]             05/11/22, 12:45 PM  12:45 PM, 05/11/22 Ollen Bowl, PT  Physical Therapist - [place] [phone]

## 2022-05-16 ENCOUNTER — Ambulatory Visit: Payer: Medicare Other

## 2022-05-16 ENCOUNTER — Ambulatory Visit: Payer: Medicare Other | Admitting: Physical Therapy

## 2022-05-16 ENCOUNTER — Encounter: Payer: Medicare Other | Admitting: Speech Pathology

## 2022-05-16 DIAGNOSIS — R2681 Unsteadiness on feet: Secondary | ICD-10-CM

## 2022-05-16 DIAGNOSIS — M79671 Pain in right foot: Secondary | ICD-10-CM

## 2022-05-16 DIAGNOSIS — R278 Other lack of coordination: Secondary | ICD-10-CM

## 2022-05-16 DIAGNOSIS — M6281 Muscle weakness (generalized): Secondary | ICD-10-CM

## 2022-05-16 DIAGNOSIS — R296 Repeated falls: Secondary | ICD-10-CM

## 2022-05-16 DIAGNOSIS — R262 Difficulty in walking, not elsewhere classified: Secondary | ICD-10-CM

## 2022-05-16 DIAGNOSIS — R2689 Other abnormalities of gait and mobility: Secondary | ICD-10-CM

## 2022-05-16 DIAGNOSIS — R269 Unspecified abnormalities of gait and mobility: Secondary | ICD-10-CM

## 2022-05-16 NOTE — Therapy (Signed)
OUTPATIENT PHYSICAL THERAPY TREATMENT NOTE  Patient Name: Cassidy Hernandez MRN: [id number] DOB:05-Feb-1941, 81 y.o., female Today's Date: 05/16/2022  PCP: Kirk Ruths, MD REFERRING PROVIDER: Rayann Heman, NP   PT End of Session - 05/16/22 1025     Visit Number 34    Number of Visits 72    Date for PT Re-Evaluation 08/03/22    Authorization Type BCBS Medicare    Authorization Time Period 03/14/22-05/09/22; Recert 27/12/1698- 17/49/4496    Progress Note Due on Visit 40    PT Start Time 1019    PT Stop Time 1059    PT Time Calculation (min) 40 min    Equipment Utilized During Treatment Gait belt    Activity Tolerance Patient tolerated treatment well    Behavior During Therapy WFL for tasks assessed/performed                   Past Medical History:  Diagnosis Date   Anginal pain ([place])    Aortic atherosclerosis    Cancer ([place])    Carotid artery stenosis 02/01/2010   a.) Doppler 75/91/6384: 66% LICA and 59% RICA. b.) Doppler 93/57/0177: >93% LICA and 90% RICA. c.) Doppler 10/25/16: >70% Bilater ICAs   Chronic bilateral low back pain with left-sided sciatica    CKD (chronic kidney disease), stage III (HCC)    Coronary artery disease    a.) LHC 12/24/2014 -> small LAD system; diffuse CTO of LAD. 60% RCA. LM and LCx with no sig disease. no intervention; med mgmt.   Degenerative arthritis    Diastolic dysfunction    a.) TTE 09/20/2018: EF 55%, G1DD, mild LAE and RVE, triv-mild panvalvular regurgitation.   Dysarthria    GERD (gastroesophageal reflux disease)    Hepatic steatosis    History of kidney stones    HLD (hyperlipidemia)    Hypertension    Kidney stone    Lumbar radiculopathy    Major depression in remission ([place])    PVD (peripheral vascular disease) ([place])    Renal cyst, right 06/02/2015   a.) CT 06/02/2015 --> 4.4 cm RIGHT renal mass; MRI recommended. b.) MRI 06/16/2015 --> 3.8 x 4.2 x 4.0 cm proteinaceous/hemmorhagic cyst.   Subclavian steal syndrome     a.) LHC 12/24/2014 --> tight eccentric 90% ostial stenosis with damping.   T2DM (type 2 diabetes mellitus) [facility])    Past Surgical History:  Procedure Laterality Date   ABDOMINAL HYSTERECTOMY     AUGMENTATION MAMMAPLASTY Bilateral 1980   BACK SURGERY  09/2018   BICEPT TENODESIS  06/10/2021   Procedure: BICEPS TENODESIS;  Surgeon: Corky Mull, MD;  Location: [facility];  Service: Orthopedics;;   CARDIAC CATHETERIZATION Left 12/24/2014   Procedure: LEFT HEARD CATHETERIZATION; Location: [facility]; Surgeon: Laurence Aly, MD   CHOLECYSTECTOMY     COLONOSCOPY N/A 02/11/2016   Procedure: COLONOSCOPY;  Surgeon: Manya Silvas, MD;  Location: [facility] ENDOSCOPY;  Service: Endoscopy;  Laterality: N/A;   IR CT HEAD LTD  11/16/2021   IR CT HEAD LTD  11/16/2021   IR INTRAVSC STENT CERV CAROTID W/O EMB-PROT MOD SED INC ANGIO  11/18/2021   IR PERCUTANEOUS ART THROMBECTOMY/INFUSION INTRACRANIAL INC DIAG ANGIO  11/16/2021   RADIOLOGY WITH ANESTHESIA N/A 11/16/2021   Procedure: IR WITH ANESTHESIA- CODE STROKE;  Surgeon: Radiologist, Medication, MD;  Location: [facility];  Service: Radiology;  Laterality: N/A;   REVERSE SHOULDER ARTHROPLASTY Right 06/10/2021   Procedure: REVERSE SHOULDER ARTHROPLASTY;  Surgeon: Corky Mull, MD;  Location: [facility];  Service: Orthopedics;  Laterality: Right;   Patient Active Problem List   Diagnosis Date Noted   Benign hypertension with CKD (chronic kidney disease) stage III ([place]) 03/02/2022   CAD (coronary artery disease) 03/02/2022   Hypercholesteremia 03/02/2022   Right hemiparesis ([place]) 12/16/2021   Acute ischemic left MCA stroke ([place]) 11/16/2021   Middle cerebral artery embolism, 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   Status post reverse arthroplasty of shoulder, right 06/10/2021   Traumatic complete tear of right rotator cuff 02/26/2021   Major depression in remission ([place]) 06/25/2020   Dysarthria 09/20/2018   Gastroesophageal reflux disease without esophagitis 09/20/2018   Lumbar radiculopathy  09/20/2018   Healthcare maintenance 05/02/2018   Controlled type 2 diabetes mellitus with stage 3 chronic kidney disease, without long-term current use of insulin ([place]) 01/30/2018   Chronic bilateral low back pain with left-sided sciatica 12/07/2016   Hepatic steatosis 11/23/2015   Carotid artery disease ([place]) 01/06/2010    REFERRING DIAG: S06.301 (ICD-10-CM) - Cerebral infarction due to unspecified occlusion or stenosis of left middle cerebral artery  THERAPY DIAG:  Muscle weakness (generalized)  Difficulty in walking, not elsewhere classified  Unsteadiness on feet  Abnormality of gait and mobility  Other abnormalities of gait and mobility  Other lack of coordination  Repeated falls  Pain in right foot  Rationale for Evaluation and Treatment Rehabilitation  PERTINENT HISTORY: Pt s/p Lt MCA treated with thrombectomy and ICA stenting. Pt has speech and motor deficits deficits. Pt has had unsteadiness and difficulty walking since DC, retropulsive falls. Pt has early foot drop which has contributed to posterior LOB issued, exacerbated by zero drop footwear.  Pt seen [facility] our [facility] in 2018 for low back pain issues in the setting of spinal stenosis.  PRECAUTIONS: N/A  SUBJECTIVE:  Patient reports sister has been helping with AFO brace and doing better without report of pain today.   PAIN:  None reported today.  TODAY'S TREATMENT:  *Adjusted ankle support prior to walking- Patient dependent and reviewed donning with patient sister who has been assisting with donning at home. Once support brace adjusted- patient performed 80 feet of ambulation: Gait with RW, CGA- focusing on reciprocal steps- with minimal VC for heel to toe gait sequencing and continuous push of walker.   Heel to toe activities- Initially placed 2 hedgehog balls (one on floor in front of patient in line with heel strike and the other positioned behind patient for toe off position)  x 12 reps each LE. Next  -attempted green theraband x 4 (too hard) so switched to YTB x 15 reps with Right LE; and 15 reps with green TB on left (patient reported as hard)  Gait with hurrycane, CGA with 2 episodes of LOB- 120 feet with reciprocal step- short and some unsteadiness - lost balance laterally requiring step reaction and min physical assist to correct.  Sit to stand x 10 reps without UE support (VC to lean forward)  Gait with hurrycane and added 3# AW to BLE- slower cadence yet no LOB patient reported fatigue around 75 feet. Removed and patient ambulated another 75 feet without weight but exhibiting improved step length overall   PATIENT EDUCATION: Education details: exercise technique, body mechanics Person educated: Patient Education method: Explanation, Demonstration, Tactile cues, and Verbal cues Education comprehension: verbalized understanding, returned demonstration, verbal cues required, tactile cues required, and needs further education   HOME EXERCISE PROGRAM:  *see prior notes     PT Short Term Goals  PT SHORT TERM GOAL #1   Title Independence in HEP for improved balance and strength    Baseline Issued at eval  8/3: Pt reports she is performing her HEP every other day, not performing all her exercises; 05/11/2022= Patient reports performing her exercises and trying to walk more at home but admits to not always being compliant.    Time 6   Period Weeks    Status On-going    Target Date 05/22/2022      PT SHORT TERM GOAL #2   Title 5xSTS hands free <19sec in order to indicate decreased fall risk   Baseline Eval: >24sec hands free; 8/3:  32 sec hands free, CGA   Time 4    Period Weeks    Status Revised    Target Date 04/11/2022       PT SHORT TERM GOAL #3   Title The pt will demonstrate Ankle DF MMT 5/5 bilat for improved gait mechanics and functional mobility   Baseline Eval: 4+/5 Right, 4/5 Left; 8/3: 4+/5 B   Time 4    Period Weeks    Status Revised     Target Date  04/11/2022     PT SHORT TERM GOAL #4  Title The pt with help of caregiver will demonstrate don/doff right ankle brace/support independently without significant difficulty.   Baseline   Time 4   Period Weeks   Status Initial   Target Date 05/30/2022             PT Long Term Goals       PT LONG TERM GOAL #1   Title Pt will ambulate at least 1000 ft on the 6MWT for progression to community ambulator and improve gait ability   Baseline July 2019: 1345f; 03/10/22: 580 ft with 4WW; 05/11/2022= 618 feet    Time 12    Period Weeks    Status  ONGOING   Target Date 08/03/2022       PT LONG TERM GOAL #2   Title Pt will complete 5xSTS <13 sec hands free to indicate decreased fall risk and increased BLE power.   Baseline eval: >24sec hands free; 8/3: 32 sec hands free, CGA; 05/11/2022= 19 sec    Time 12   Period Weeks    Status ONGOING   Target Date 08/03/2022       PT LONG TERM GOAL #3   Title Pt to demonstrate retroAMB without device >0.242m s LOB to demonstrate improved ankle righting and control.    Baseline Eval: <0.0125mand c 3 LOB 03/10/22: .08 m/s with 4WW; 05/11/2022= 0.02 m/s with 4WW   Time 112   Period Weeks    Status ONGOING   Target Date 08/03/2022      PT LONG TERM GOAL #4   Title The pt FOTO Score will increase by >15 points to indicate improved functional mobility and QOL    Baseline eval: 48; 03/10/2022: 61% (previously 62% on 02/02/22)    Time 12    Period Weeks    Status Achieved (revised to indicate functional purpose of goal)    Target Date              Plan     Clinical Impression Statement Patient presents as motivated for her visit and practiced with use of cane today. She had mostly positive experience minus a couple of episodes of LOB. She did become more confident in her gait sequencing with practice and limited at the end more by fatigue  than any unsteadiness. Pt will continue to benefit from skilled therapy to address remaining deficits in  order to improve overall QoL and return to PLOF.     Personal Factors and Comorbidities Past/Current Experience    Examination-Activity Limitations Dressing;Transfers;Bend;Locomotion Level;Stairs;Stand    Examination-Participation Restrictions Cleaning;Shop;Meal Prep;Valla Leaver Work;Laundry    Stability/Clinical Decision Making Evolving/Moderate complexity    Rehab Potential Good    Clinical Impairments Affecting Rehab Potential (+) motivation, social support (-) age, chronicity of pain, fairly sedentary lifestyle    PT Frequency 2x / week    PT Duration 12 weeks    PT Treatment/Interventions Aquatic Therapy;Moist Heat;Functional mobility training;Dry needling;Neuromuscular re-education;Traction;Ultrasound;Iontophoresis '4mg'$ /ml Dexamethasone;ADLs/Self Care Home Management;Cryotherapy;Electrical Stimulation;Therapeutic exercise;Therapeutic activities;Patient/family education;Taping;Gait training;Stair training;DME Instruction;Balance training;Passive range of motion    PT Next Visit Plan Progress standing functional interventions as appropriate, updated HEP, assess hip mobility for potential restrictions   PT Home Exercise Plan Eval: seated heel raises, toe raises, and STS from elevated surface. 01/04/2022=Access Code: [email or web address]  URL: [email or web address]    Consulted and Agree with Plan of Care Patient;Family member/caregiver    Family Member Consulted Earnestine             05/16/22, 1:12 PM  1:12 PM, 05/16/22 Ollen Bowl, PT  Physical Therapist - [place] [phone]

## 2022-05-18 ENCOUNTER — Ambulatory Visit: Payer: Medicare Other

## 2022-05-18 ENCOUNTER — Ambulatory Visit: Payer: Medicare Other | Admitting: Podiatry

## 2022-05-18 ENCOUNTER — Ambulatory Visit: Payer: Medicare Other | Admitting: Speech Pathology

## 2022-05-18 ENCOUNTER — Telehealth: Payer: Self-pay

## 2022-05-18 ENCOUNTER — Encounter: Payer: Medicare Other | Admitting: Speech Pathology

## 2022-05-18 NOTE — Telephone Encounter (Signed)
PT left VM over secure line due to missed visit today. PT reminded pt of next visit date and time.  Ricard Dillon PT, DPT

## 2022-05-23 ENCOUNTER — Encounter: Payer: Medicare Other | Admitting: Speech Pathology

## 2022-05-23 ENCOUNTER — Ambulatory Visit: Payer: Medicare Other

## 2022-05-25 ENCOUNTER — Encounter: Payer: Medicare Other | Admitting: Speech Pathology

## 2022-05-25 ENCOUNTER — Ambulatory Visit: Payer: Medicare Other

## 2022-05-25 ENCOUNTER — Ambulatory Visit: Payer: Medicare Other | Admitting: Speech Pathology

## 2022-05-25 DIAGNOSIS — R471 Dysarthria and anarthria: Secondary | ICD-10-CM

## 2022-05-25 DIAGNOSIS — R262 Difficulty in walking, not elsewhere classified: Secondary | ICD-10-CM

## 2022-05-25 DIAGNOSIS — M6281 Muscle weakness (generalized): Secondary | ICD-10-CM

## 2022-05-25 DIAGNOSIS — R278 Other lack of coordination: Secondary | ICD-10-CM

## 2022-05-25 DIAGNOSIS — R4701 Aphasia: Secondary | ICD-10-CM

## 2022-05-25 DIAGNOSIS — R2681 Unsteadiness on feet: Secondary | ICD-10-CM

## 2022-05-25 NOTE — Therapy (Signed)
OUTPATIENT PHYSICAL THERAPY TREATMENT NOTE  Patient Name: Cassidy Hernandez MRN: [id number] DOB:Oct 08, 1940, 81 y.o., female Today's Date: 05/26/2022  PCP: Kirk Ruths, MD REFERRING PROVIDER: Rayann Heman, NP   PT End of Session - 05/25/22 1554     Visit Number 35    Number of Visits 2    Date for PT Re-Evaluation 08/03/22    Authorization Type BCBS Medicare    Authorization Time Period 03/14/22-05/09/22; Recert 50/0/9381- 82/99/3716    Progress Note Due on Visit 40    PT Start Time 1351    PT Stop Time 1432    PT Time Calculation (min) 41 min    Equipment Utilized During Treatment Gait belt    Activity Tolerance Patient tolerated treatment well    Behavior During Therapy WFL for tasks assessed/performed                    Past Medical History:  Diagnosis Date   Anginal pain ([place])    Aortic atherosclerosis    Cancer ([place])    Carotid artery stenosis 02/01/2010   a.) Doppler 96/78/9381: 01% LICA and 75% RICA. b.) Doppler 06/01/8526: >78% LICA and 24% RICA. c.) Doppler 10/25/16: >70% Bilater ICAs   Chronic bilateral low back pain with left-sided sciatica    CKD (chronic kidney disease), stage III (HCC)    Coronary artery disease    a.) LHC 12/24/2014 -> small LAD system; diffuse CTO of LAD. 60% RCA. LM and LCx with no sig disease. no intervention; med mgmt.   Degenerative arthritis    Diastolic dysfunction    a.) TTE 09/20/2018: EF 55%, G1DD, mild LAE and RVE, triv-mild panvalvular regurgitation.   Dysarthria    GERD (gastroesophageal reflux disease)    Hepatic steatosis    History of kidney stones    HLD (hyperlipidemia)    Hypertension    Kidney stone    Lumbar radiculopathy    Major depression in remission ([place])    PVD (peripheral vascular disease) ([place])    Renal cyst, right 06/02/2015   a.) CT 06/02/2015 --> 4.4 cm RIGHT renal mass; MRI recommended. b.) MRI 06/16/2015 --> 3.8 x 4.2 x 4.0 cm proteinaceous/hemmorhagic cyst.   Subclavian steal syndrome     a.) LHC 12/24/2014 --> tight eccentric 90% ostial stenosis with damping.   T2DM (type 2 diabetes mellitus) [facility])    Past Surgical History:  Procedure Laterality Date   ABDOMINAL HYSTERECTOMY     AUGMENTATION MAMMAPLASTY Bilateral 1980   BACK SURGERY  09/2018   BICEPT TENODESIS  06/10/2021   Procedure: BICEPS TENODESIS;  Surgeon: Corky Mull, MD;  Location: [facility];  Service: Orthopedics;;   CARDIAC CATHETERIZATION Left 12/24/2014   Procedure: LEFT HEARD CATHETERIZATION; Location: [facility]; Surgeon: Laurence Aly, MD   CHOLECYSTECTOMY     COLONOSCOPY N/A 02/11/2016   Procedure: COLONOSCOPY;  Surgeon: Manya Silvas, MD;  Location: [facility] ENDOSCOPY;  Service: Endoscopy;  Laterality: N/A;   IR CT HEAD LTD  11/16/2021   IR CT HEAD LTD  11/16/2021   IR INTRAVSC STENT CERV CAROTID W/O EMB-PROT MOD SED INC ANGIO  11/18/2021   IR PERCUTANEOUS ART THROMBECTOMY/INFUSION INTRACRANIAL INC DIAG ANGIO  11/16/2021   RADIOLOGY WITH ANESTHESIA N/A 11/16/2021   Procedure: IR WITH ANESTHESIA- CODE STROKE;  Surgeon: Radiologist, Medication, MD;  Location: [facility];  Service: Radiology;  Laterality: N/A;   REVERSE SHOULDER ARTHROPLASTY Right 06/10/2021   Procedure: REVERSE SHOULDER ARTHROPLASTY;  Surgeon: Corky Mull, MD;  Location:  [facility];  Service: Orthopedics;  Laterality: Right;   Patient Active Problem List   Diagnosis Date Noted   Benign hypertension with CKD (chronic kidney disease) stage III ([place]) 03/02/2022   CAD (coronary artery disease) 03/02/2022   Hypercholesteremia 03/02/2022   Right hemiparesis ([place]) 12/16/2021   Acute ischemic left MCA stroke ([place]) 11/16/2021   Middle cerebral artery embolism, 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   Status post reverse arthroplasty of shoulder, right 06/10/2021   Traumatic complete tear of right rotator cuff 02/26/2021   Major depression in remission ([place]) 06/25/2020   Dysarthria 09/20/2018   Gastroesophageal reflux disease without esophagitis 09/20/2018   Lumbar radiculopathy  09/20/2018   Healthcare maintenance 05/02/2018   Controlled type 2 diabetes mellitus with stage 3 chronic kidney disease, without long-term current use of insulin ([place]) 01/30/2018   Chronic bilateral low back pain with left-sided sciatica 12/07/2016   Hepatic steatosis 11/23/2015   Carotid artery disease ([place]) 01/06/2010    REFERRING DIAG: V40.981 (ICD-10-CM) - Cerebral infarction due to unspecified occlusion or stenosis of left middle cerebral artery  THERAPY DIAG:  Muscle weakness (generalized)  Unsteadiness on feet  Other lack of coordination  Difficulty in walking, not elsewhere classified  Rationale for Evaluation and Treatment Rehabilitation  PERTINENT HISTORY: Pt s/p Lt MCA treated with thrombectomy and ICA stenting. Pt has speech and motor deficits deficits. Pt has had unsteadiness and difficulty walking since DC, retropulsive falls. Pt has early foot drop which has contributed to posterior LOB issued, exacerbated by zero drop footwear.  Pt seen at our Sports clinic in 2018 for low back pain issues in the setting of spinal stenosis.  PRECAUTIONS: N/A  SUBJECTIVE:  Pt went to her granddaughter's wedding recently. Her daughter accompanies her for PT appointment and reports pt did really well ascending/descending stairs while at the wedding. Her daughter reports pt did fall one time since last appointment. This occurred when pt was backing up with the walker and fell backward. Pt's nephew helped pt get back up. She reports she did not hit her head but hurt her hand, no serious injury. Pt presents without AFO today. Reports her sister has been putting it on for her and she doesn't know how to so she didn't bring it. PT encourages pt to bring AFO to next session for education on how to don it.     PAIN:  RLE 5th digit  TODAY'S TREATMENT:  TherEx- PT examines pt's R 5th toe as pt reports pain and redness. Pt has toe wrapped in bandage. She did have spacer on but it has come off,  and PT assists in donning it. Unable to see skin directly under bandaid, however, the area is not tender to touch and skin surrounding area is WNL in both appearance and skin temperature. Discussed signs and symptoms to watch out for.  Pt did report in session toe felt much better with spacer back in place between 4th and 5th digits.  Standing heel raise 15x B  Standing DF 15x B  Seated DF 15x B  STS 10x hands free cuing for upright posture  Pt tolerates interventions well without pain or significant fatigue   Updated HEP (see below)   NMR: Gait with RW 1x148 ft cuing for large-amplitude step with focus on RLE step-length and heel-strike  Step length exercise performed over 10 meters: Trial 1 - 32 steps Trial 2- 30 steps Trial  3- 29 steps With 3# weights on each LE Trial 1 - 30 steps Trial 2 -  25 steps Trial 3 - 25 steps Comments: improvement with weights donned and following large-amplitude cues  Standing heel tap onto balance pod with 4# weights on each LE - x multiple reps performed to promote DF and heel-strike. Seated heel tap on balance pod to promote DF, then progressed to seated heel tap on cone performed with RLE only and no weights       PATIENT EDUCATION: Education details: exercise technique, body mechanics Person educated: Patient Education method: Explanation, Demonstration, Tactile cues, and Verbal cues Education comprehension: verbalized understanding, returned demonstration, verbal cues required, tactile cues required, and needs further education   HOME EXERCISE PROGRAM:  05/25/2022: Access Code: [email or web address] URL: [email or web address] Date: 05/25/2022 Prepared by: Ricard Dillon  Exercises - Seated Heel Toe Raises  - 1 x daily - 6 x weekly - 2 sets - 20 reps - 1 second hold  *see prior notes     PT Short Term Goals       PT SHORT TERM GOAL #1   Title Independence in HEP for improved balance and strength    Baseline Issued at  eval  8/3: Pt reports she is performing her HEP every other day, not performing all her exercises; 05/11/2022= Patient reports performing her exercises and trying to walk more at home but admits to not always being compliant.    Time 6   Period Weeks    Status On-going    Target Date 05/22/2022      PT SHORT TERM GOAL #2   Title 5xSTS hands free <19sec in order to indicate decreased fall risk   Baseline Eval: >24sec hands free; 8/3:  32 sec hands free, CGA   Time 4    Period Weeks    Status Revised    Target Date 04/11/2022       PT SHORT TERM GOAL #3   Title The pt will demonstrate Ankle DF MMT 5/5 bilat for improved gait mechanics and functional mobility   Baseline Eval: 4+/5 Right, 4/5 Left; 8/3: 4+/5 B   Time 4    Period Weeks    Status Revised     Target Date 04/11/2022     PT SHORT TERM GOAL #4  Title The pt with help of caregiver will demonstrate don/doff right ankle brace/support independently without significant difficulty.   Baseline   Time 4   Period Weeks   Status Initial   Target Date 05/30/2022             PT Long Term Goals       PT LONG TERM GOAL #1   Title Pt will ambulate at least 1000 ft on the 6MWT for progression to community ambulator and improve gait ability   Baseline July 2019: 1370f; 03/10/22: 580 ft with 4WW; 05/11/2022= 618 feet    Time 12    Period Weeks    Status  ONGOING   Target Date 08/03/2022       PT LONG TERM GOAL #2   Title Pt will complete 5xSTS <13 sec hands free to indicate decreased fall risk and increased BLE power.   Baseline eval: >24sec hands free; 8/3: 32 sec hands free, CGA; 05/11/2022= 19 sec    Time 12   Period Weeks    Status ONGOING   Target Date 08/03/2022       PT LONG TERM GOAL #3   Title Pt to demonstrate retroAMB without device >0.246m s LOB to demonstrate improved ankle righting and control.    Baseline Eval: <  0.73ms and c 3 LOB 03/10/22: .08 m/s with 4WW; 05/11/2022= 0.02 m/s with 4WW   Time 112    Period Weeks    Status ONGOING   Target Date 08/03/2022      PT LONG TERM GOAL #4   Title The pt FOTO Score will increase by >15 points to indicate improved functional mobility and QOL    Baseline eval: 48; 03/10/2022: 61% (previously 62% on 02/02/22)    Time 12    Period Weeks    Status Achieved (revised to indicate functional purpose of goal)    Target Date              Plan     Clinical Impression Statement Pt shows within-session improvement with increased step-length today following large-amplitude cues and use of ankle weights for strength challenge and tactile input. While pt was able to improve B step-length, she still has difficulty with performing heel-strike and was encouraged to bring in her AFO to appointment to address this deficit. Pt will continue to benefit from skilled therapy to address remaining deficits in order to improve overall QoL and return to PLOF.     Personal Factors and Comorbidities Past/Current Experience    Examination-Activity Limitations Dressing;Transfers;Bend;Locomotion Level;Stairs;Stand    Examination-Participation Restrictions Cleaning;Shop;Meal Prep;YValla LeaverWork;Laundry    Stability/Clinical Decision Making Evolving/Moderate complexity    Rehab Potential Good    Clinical Impairments Affecting Rehab Potential (+) motivation, social support (-) age, chronicity of pain, fairly sedentary lifestyle    PT Frequency 2x / week    PT Duration 12 weeks    PT Treatment/Interventions Aquatic Therapy;Moist Heat;Functional mobility training;Dry needling;Neuromuscular re-education;Traction;Ultrasound;Iontophoresis '4mg'$ /ml Dexamethasone;ADLs/Self Care Home Management;Cryotherapy;Electrical Stimulation;Therapeutic exercise;Therapeutic activities;Patient/family education;Taping;Gait training;Stair training;DME Instruction;Balance training;Passive range of motion    PT Next Visit Plan Progress standing functional interventions as appropriate, updated HEP, assess hip  mobility for potential restrictions   PT Home Exercise Plan Eval: seated heel raises, toe raises, and STS from elevated surface. 01/04/2022=Access Code: [email or web address] URL: [email or web address]    Consulted and Agree with Plan of Care Patient;Family member/caregiver    Family Member Consulted Earnestine             05/26/22, 8:20 AM  8:20 AM, 05/26/22 HRicard DillonPT, DPT  Physical Therapist - [phone]

## 2022-05-26 NOTE — Therapy (Signed)
OUTPATIENT SPEECH LANGUAGE PATHOLOGY TREATMENT NOTE  Patient Name: Cassidy Hernandez MRN: [id number] DOB:1941-03-19, 81 y.o., female Today's Date: 05/26/2022    REFERRING PROVIDER: Merrilee Seashore, NP     End of Session - 05/26/22 1053     Visit Number 38    Number of Visits 42    Date for SLP Re-Evaluation 06/14/22    Authorization Type BCBS MCR; $10 copay    Authorization - Visit Number 8    Progress Note Due on Visit 10    SLP Start Time 1500    SLP Stop Time  1600    SLP Time Calculation (min) 60 min    Activity Tolerance Patient tolerated treatment well                 Past Medical History:  Diagnosis Date   Anginal pain ([place])    Aortic atherosclerosis    Cancer ([place])    Carotid artery stenosis 02/01/2010   a.) Doppler 46/96/2952: 84% LICA and 13% RICA. b.) Doppler 24/40/1027: >25% LICA and 36% RICA. c.) Doppler 10/25/16: >70% Bilater ICAs   Chronic bilateral low back pain with left-sided sciatica    CKD (chronic kidney disease), stage III (HCC)    Coronary artery disease    a.) LHC 12/24/2014 -> small LAD system; diffuse CTO of LAD. 60% RCA. LM and LCx with no sig disease. no intervention; med mgmt.   Degenerative arthritis    Diastolic dysfunction    a.) TTE 09/20/2018: EF 55%, G1DD, mild LAE and RVE, triv-mild panvalvular regurgitation.   Dysarthria    GERD (gastroesophageal reflux disease)    Hepatic steatosis    History of kidney stones    HLD (hyperlipidemia)    Hypertension    Kidney stone    Lumbar radiculopathy    Major depression in remission ([place])    PVD (peripheral vascular disease) ([place])    Renal cyst, right 06/02/2015   a.) CT 06/02/2015 --> 4.4 cm RIGHT renal mass; MRI recommended. b.) MRI 06/16/2015 --> 3.8 x 4.2 x 4.0 cm proteinaceous/hemmorhagic cyst.   Subclavian steal syndrome    a.) LHC 12/24/2014 --> tight eccentric 90% ostial stenosis with damping.   T2DM (type 2 diabetes mellitus) [facility])    Past Surgical History:  Procedure Laterality  Date   ABDOMINAL HYSTERECTOMY     AUGMENTATION MAMMAPLASTY Bilateral 1980   BACK SURGERY  09/2018   BICEPT TENODESIS  06/10/2021   Procedure: BICEPS TENODESIS;  Surgeon: Corky Mull, MD;  Location: [place] [facility];  Service: Orthopedics;;   CARDIAC CATHETERIZATION Left 12/24/2014   Procedure: LEFT HEARD CATHETERIZATION; Location: [facility]; Surgeon: Laurence Aly, MD   CHOLECYSTECTOMY     COLONOSCOPY N/A 02/11/2016   Procedure: COLONOSCOPY;  Surgeon: Manya Silvas, MD;  Location: [facility] ENDOSCOPY;  Service: Endoscopy;  Laterality: N/A;   IR CT HEAD LTD  11/16/2021   IR CT HEAD LTD  11/16/2021   IR INTRAVSC STENT CERV CAROTID W/O EMB-PROT MOD SED INC ANGIO  11/18/2021   IR PERCUTANEOUS ART THROMBECTOMY/INFUSION INTRACRANIAL INC DIAG ANGIO  11/16/2021   RADIOLOGY WITH ANESTHESIA N/A 11/16/2021   Procedure: IR WITH ANESTHESIA- CODE STROKE;  Surgeon: Radiologist, Medication, MD;  Location: [place];  Service: Radiology;  Laterality: N/A;   REVERSE SHOULDER ARTHROPLASTY Right 06/10/2021   Procedure: REVERSE SHOULDER ARTHROPLASTY;  Surgeon: Corky Mull, MD;  Location: [facility];  Service: Orthopedics;  Laterality: Right;   Patient Active Problem List   Diagnosis Date Noted   Benign hypertension  with CKD (chronic kidney disease) stage III ([place]) 03/02/2022   CAD (coronary artery disease) 03/02/2022   Hypercholesteremia 03/02/2022   Right hemiparesis ([place]) 12/16/2021   Acute ischemic left MCA stroke ([place]) 11/16/2021   Middle cerebral artery embolism, 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   Status post reverse arthroplasty of shoulder, right 06/10/2021   Traumatic complete tear of right rotator cuff 02/26/2021   Major depression in remission ([place]) 06/25/2020   Dysarthria 09/20/2018   Gastroesophageal reflux disease without esophagitis 09/20/2018   Lumbar radiculopathy 09/20/2018   Healthcare maintenance 05/02/2018   Controlled type 2 diabetes mellitus with stage 3 chronic kidney disease, without long-term current use of insulin ([place])  01/30/2018   Chronic bilateral low back pain with left-sided sciatica 12/07/2016   Hepatic steatosis 11/23/2015   Carotid artery disease ([place]) 01/06/2010    ONSET DATE: 11/16/21   REFERRING DIAG: L MCA CVA   HPI: Pt is a 81 y.o. 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 Patient is s/p thrombectomy with left ICA stent placement 11/16/21. She completed inpatient [facility] 11/25/21-12/07/21 and was discharged home with family. Patient known to this clinic from prior course of voice therapy in 2018. Pt with documented hx of voice and speech changes (dysarthria) per videostroboscopy 12/26/17; did not attend therapy.   THERAPY DIAG:  Dysarthria and anarthria  Aphasia  Rationale for Evaluation and Treatment Rehabilitation  SUBJECTIVE: "How was the wedding?"  Pt accompanied by: [name]  PAIN: No Are you having pain? No     OBJECTIVE:   TODAY'S TREATMENT: Pt shared details re: her granddaughter's wedding using photos on her phone to supplement verbal communication. SLP used push/pull with /a/ to increase pt's loudness in conversation. Pt sustained phonation for /a/ for 6.7 seconds, avergage 82 dB. Education provided to daughter and pt on cuing strategies and rationale for using louder speech to improve voicing and articulation. Daughter began cuing pt for effort level when spontaneous responses/questions were not fully intelligible. Initially, pt required usual mod cues to increase effort during question and answer (initial response 70% intelligible, improves to 95% with increased effort level), but able to fade cues to non-verbal (frequent) when loudness was suboptimal, and pt initiated second attempt with greater intensity. 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    PATIENT EDUCATION: Education details: loudness improves speech across many parameters; anticipate baseline voice difficulties will persist, and how manage this Person educated: Patient Education method: Explanation, Demonstration, and Verbal cues Education comprehension: verbalized understanding and needs further education    SLP Short Term Goals - 04/04/22 1541       SLP SHORT TERM GOAL #5   Title Pt will write sentences of 5-7 words 90% accuracy (self correction allowed) with occasional min cues.    Time 10    Period --   sessions   Status Achieved      Additional Short Term Goals   Additional Short Term Goals Yes      SLP SHORT TERM GOAL #6   Title Pt will use intelligibility strategies (loudness, breath support, overarticulation) for sentence level responses 90% accuracy with min cues.    Time 10    Period --   sessions   Status Achieved      SLP SHORT TERM GOAL #7  Title Pt will use aphasia compensations in 10 minutes moderately complex conversation with fewer than 3 listener questions necessary for clarification of message.    Time 10    Period --   sessions   Status Achieved      SLP SHORT TERM GOAL #8   Title Patient will complete HEP for voice building with rare min cues.    Time 10   sessions   Status New      SLP SHORT TERM GOAL  #9   TITLE Pt will maintain average >72 dB and intelligibility >95%  in 5 minutes conversation.    Time 10   sessions   Status New             SLP Long Term Goals - 04/04/22 1544     SLP LONG TERM GOAL #3   Title Pt will demonstrate reading comprehension of mod complex short story or articles of interest with technology supports (text-to-speech, ereader, etc) by generating summary or answering questions >80% accuracy.    Baseline 02/23/22: Reads/comprehends simple paragraphs unassisted. 04/04/22 comprehends mod complex paragraphs with mod A    Time --    Period Weeks    Status Deferred 04/26/22 per pt request     SLP LONG  TERM GOAL #4   Title Pt will self-advocate for communication needs in community or family outings (per her report) to increase confidence in social interactions.    Time 12    Period Weeks    Status On-going      SLP LONG TERM GOAL #5   Title Pt will report reduced frustration with communication breakdowns by implementing aphasia compensation outside [place] room.    Baseline 02/24/22: frustrated 3 times per day on average    Time 12    Period Weeks    Status Achieved      SLP LONG TERM GOAL #6   Title Pt will use intelligibility strategies (loudness, breath support, overarticulation) 80% accuracy in 10 minutes conversation with modified independence.    Time 12    Period Weeks    Status On-going                   Plan    Clinical Impression Statement Pt continues with mild aphasia and mild-moderate dysarthria. Patient continues to require cues for effort level in structured tasks. Plan for d/c on Nov. 1.  Intensity based cues and pausing remain most effective strategies. Patient agrees her voice and intelligibility is her greatest concern at this time. Pt is progressing toward goals and benefits from continued skilled ST intervention per plan of care.    Speech Therapy Frequency 2x / week    Duration 2 weeks    Treatment/Interventions SLP instruction and feedback;Multimodal communcation approach;Patient/family education;Functional tasks;Cueing hierarchy;Compensatory techniques;Compensatory strategies    Potential to Achieve Goals Good    Potential Considerations Family/community support    SLP Home Exercise Plan TalkPath Therapy    Consulted and Agree with Plan of Care Patient             Deneise Lever, MS, CCC-SLP Speech-Language Pathologist [phone]

## 2022-05-30 ENCOUNTER — Encounter: Payer: Medicare Other | Admitting: Speech Pathology

## 2022-05-30 ENCOUNTER — Ambulatory Visit: Payer: Medicare Other

## 2022-06-01 ENCOUNTER — Ambulatory Visit: Payer: Medicare Other

## 2022-06-01 ENCOUNTER — Encounter: Payer: Medicare Other | Admitting: Speech Pathology

## 2022-06-01 ENCOUNTER — Ambulatory Visit: Payer: Medicare Other | Admitting: Speech Pathology

## 2022-06-01 ENCOUNTER — Telehealth: Payer: Self-pay | Admitting: Speech Pathology

## 2022-06-01 DIAGNOSIS — R262 Difficulty in walking, not elsewhere classified: Secondary | ICD-10-CM

## 2022-06-01 DIAGNOSIS — R4701 Aphasia: Secondary | ICD-10-CM

## 2022-06-01 DIAGNOSIS — M6281 Muscle weakness (generalized): Secondary | ICD-10-CM

## 2022-06-01 DIAGNOSIS — R278 Other lack of coordination: Secondary | ICD-10-CM

## 2022-06-01 DIAGNOSIS — R471 Dysarthria and anarthria: Secondary | ICD-10-CM

## 2022-06-01 DIAGNOSIS — R2681 Unsteadiness on feet: Secondary | ICD-10-CM

## 2022-06-01 DIAGNOSIS — R269 Unspecified abnormalities of gait and mobility: Secondary | ICD-10-CM

## 2022-06-01 NOTE — Therapy (Signed)
OUTPATIENT PHYSICAL THERAPY TREATMENT NOTE/DISCHARGE SUMMARY   Patient Name: Cassidy Hernandez MRN: [id number] DOB:15-Sep-1940, 81 y.o., female Today's Date: 06/01/2022  PCP: Kirk Ruths, MD REFERRING PROVIDER: Rayann Heman, NP   PT End of Session - 06/01/22 1114     Visit Number 36    Number of Visits 5    Date for PT Re-Evaluation 08/03/22    Authorization Type BCBS Medicare    Authorization Time Period 05/11/2022- 08/03/2022    Progress Note Due on Visit 40    PT Start Time 1100    PT Stop Time 1130    PT Time Calculation (min) 30 min    Equipment Utilized During Treatment Gait belt    Activity Tolerance Patient tolerated treatment well    Behavior During Therapy [facility] for tasks assessed/performed                    Past Medical History:  Diagnosis Date   Anginal pain ([place])    Aortic atherosclerosis    Cancer ([place])    Carotid artery stenosis 02/01/2010   a.) Doppler 40/98/1191: 47% LICA and 82% RICA. b.) Doppler 95/62/1308: >65% LICA and 78% RICA. c.) Doppler 10/25/16: >70% Bilater ICAs   Chronic bilateral low back pain with left-sided sciatica    CKD (chronic kidney disease), stage III ([place])    Coronary artery disease    a.) LHC 12/24/2014 -> small LAD system; diffuse CTO of LAD. 60% RCA. LM and LCx with no sig disease. no intervention; med mgmt.   Degenerative arthritis    Diastolic dysfunction    a.) TTE 09/20/2018: EF 55%, G1DD, mild LAE and RVE, triv-mild panvalvular regurgitation.   Dysarthria    GERD (gastroesophageal reflux disease)    Hepatic steatosis    History of kidney stones    HLD (hyperlipidemia)    Hypertension    Kidney stone    Lumbar radiculopathy    Major depression in remission ([place])    PVD (peripheral vascular disease) ([place])    Renal cyst, right 06/02/2015   a.) CT 06/02/2015 --> 4.4 cm RIGHT renal mass; MRI recommended. b.) MRI 06/16/2015 --> 3.8 x 4.2 x 4.0 cm proteinaceous/hemmorhagic cyst.   Subclavian steal syndrome     a.) LHC 12/24/2014 --> tight eccentric 90% ostial stenosis with damping.   T2DM (type 2 diabetes mellitus) [facility])    Past Surgical History:  Procedure Laterality Date   ABDOMINAL HYSTERECTOMY     AUGMENTATION MAMMAPLASTY Bilateral 1980   BACK SURGERY  09/2018   BICEPT TENODESIS  06/10/2021   Procedure: BICEPS TENODESIS;  Surgeon: Corky Mull, MD;  Location: [place] [facility];  Service: Orthopedics;;   CARDIAC CATHETERIZATION Left 12/24/2014   Procedure: LEFT HEARD CATHETERIZATION; Location: [facility]; Surgeon: Laurence Aly, MD   CHOLECYSTECTOMY     COLONOSCOPY N/A 02/11/2016   Procedure: COLONOSCOPY;  Surgeon: Manya Silvas, MD;  Location: [facility] ENDOSCOPY;  Service: Endoscopy;  Laterality: N/A;   IR CT HEAD LTD  11/16/2021   IR CT HEAD LTD  11/16/2021   IR INTRAVSC STENT CERV CAROTID W/O EMB-PROT MOD SED INC ANGIO  11/18/2021   IR PERCUTANEOUS ART THROMBECTOMY/INFUSION INTRACRANIAL INC DIAG ANGIO  11/16/2021   RADIOLOGY WITH ANESTHESIA N/A 11/16/2021   Procedure: IR WITH ANESTHESIA- CODE STROKE;  Surgeon: Radiologist, Medication, MD;  Location: [place];  Service: Radiology;  Laterality: N/A;   REVERSE SHOULDER ARTHROPLASTY Right 06/10/2021   Procedure: REVERSE SHOULDER ARTHROPLASTY;  Surgeon: Corky Mull, MD;  Location:  [facility];  Service: Orthopedics;  Laterality: Right;   Patient Active Problem List   Diagnosis Date Noted   Benign hypertension with CKD (chronic kidney disease) stage III ([place]) 03/02/2022   CAD (coronary artery disease) 03/02/2022   Hypercholesteremia 03/02/2022   Right hemiparesis ([place]) 12/16/2021   Acute ischemic left MCA stroke ([place]) 11/16/2021   Middle cerebral artery embolism, 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   Status post reverse arthroplasty of shoulder, right 06/10/2021   Traumatic complete tear of right rotator cuff 02/26/2021   Major depression in remission ([place]) 06/25/2020   Dysarthria 09/20/2018   Gastroesophageal reflux disease without esophagitis 09/20/2018   Lumbar radiculopathy  09/20/2018   Healthcare maintenance 05/02/2018   Controlled type 2 diabetes mellitus with stage 3 chronic kidney disease, without long-term current use of insulin ([place]) 01/30/2018   Chronic bilateral low back pain with left-sided sciatica 12/07/2016   Hepatic steatosis 11/23/2015   Carotid artery disease ([place]) 01/06/2010    REFERRING DIAG: B51.025 (ICD-10-CM) - Cerebral infarction due to unspecified occlusion or stenosis of left middle cerebral artery  THERAPY DIAG:  Dysarthria and anarthria  Aphasia  Muscle weakness (generalized)  Unsteadiness on feet  Other lack of coordination  Difficulty in walking, not elsewhere classified  Abnormality of gait and mobility  Rationale for Evaluation and Treatment Rehabilitation  PERTINENT HISTORY: Pt s/p Lt MCA treated with thrombectomy and ICA stenting. Pt has speech and motor deficits deficits. Pt has had unsteadiness and difficulty walking since DC, retropulsive falls. Pt has early foot drop which has contributed to posterior LOB issued, exacerbated by zero drop footwear.  Pt seen at our Sports clinic in 2018 for low back pain issues in the setting of spinal stenosis.  PRECAUTIONS: N/A  SUBJECTIVE:  Pt denies any updates since prior session. Reports she has been walking at home in the hallway withf RW. She has not been using AFO at home, uses it partly when going out, but feel sit makes her foot pain worse.      PAIN:  7/10 lateral toe pain when standing, walking   TODAY'S TREATMENT:  -overground AMB c 4WW 276f, 351ms ended early due to worsening toe pain  -STS x10 (several false starts, requires 18 attempts to achieve 10x success; no carryover of cues given since eval, similar performance) -FOTO survey     PATIENT EDUCATION: Education details: need to be fitted for shoe sizing and get wider shoe that does not rub feet  Person educated: Patient Education method: Explanation, Demonstration, Tactile cues, and Verbal  cues Education comprehension: verbalized understanding, returned demonstration, verbal cues required, tactile cues required, and needs further education   HOME EXERCISE PROGRAM:  05/25/2022: Access Code: [email or web address]: [email or web address] Date: 05/25/2022 Prepared by: HaRicard DillonExercises - Seated Heel Toe Raises  - 1 x daily - 6 x weekly - 2 sets - 20 reps - 1 second hold  *see prior notes     PT Short Term Goals       PT SHORT TERM GOAL #1   Title Independence in HEP for improved balance and strength    Baseline Issued at eval  8/3: Pt reports she is performing her HEP every other day, not performing all her exercises; 05/11/2022= Patient reports performing her exercises and trying to walk more at home but admits to not always being compliant.    Time 6   Period Weeks    Status On-going    Target Date 05/22/2022      PT SHORT TERM GOAL #  2   Title 5xSTS hands free <19sec in order to indicate decreased fall risk   Baseline Eval: >24sec hands free; 8/3:  32 sec hands free, CGA   Time 4    Period Weeks    Status Revised    Target Date 04/11/2022       PT SHORT TERM GOAL #3   Title The pt will demonstrate Ankle DF MMT 5/5 bilat for improved gait mechanics and functional mobility   Baseline Eval: 4+/5 Right, 4/5 Left; 8/3: 4+/5 B   Time 4    Period Weeks    Status Revised     Target Date 04/11/2022     PT SHORT TERM GOAL #4  Title The pt with help of caregiver will demonstrate don/doff right ankle brace/support independently without significant difficulty.   Baseline   Time 4   Period Weeks   Status Not met    Target Date 05/30/2022             PT Long Term Goals       PT LONG TERM GOAL #1   Title Pt will ambulate at least 1000 ft on the 6MWT for progression to community ambulator and improve gait ability   Baseline July 2019: 1366f; 03/10/22: 580 ft with 4WW; 05/11/2022= 618 feet    Time 12    Period Weeks    Status  Not Met    Target  Date 08/03/2022       PT LONG TERM GOAL #2   Title Pt will complete 5xSTS <13 sec hands free to indicate decreased fall risk and increased BLE power.   Baseline eval: >24sec hands free; 8/3: 32 sec hands free, CGA; 05/11/2022= 19 sec    Time 12   Period Weeks    Status Not Met    Target Date 08/03/2022       PT LONG TERM GOAL #3   Title Pt to demonstrate retroAMB without device >0.240m s LOB to demonstrate improved ankle righting and control.    Baseline Eval: <0.0131mand c 3 LOB 03/10/22: .08 m/s with 4WW; 05/11/2022= 0.02 m/s with 4WW   Time 112   Period Weeks    Status Not Met   Target Date 08/03/2022      PT LONG TERM GOAL #4   Title The pt FOTO Score will increase by >15 points to indicate improved functional mobility and QOL    Baseline eval: 48; 03/10/2022: 61% (previously 62% on 02/02/22)    Time 12    Period Weeks    Status Not met    Target Date              Plan     Clinical Impression Statement Final visit today as planned. Unfortunately AFO donning/doffing could not be achieved fully with caregivers due to device complexity, and pain in toes now complicating things. Pt caregiver educated on obtaining a wider shoe that does not rub toes. FOTO survey reassessed today with a score decrease. Other outcome measures not achieved due to limited progress overall. Generally speaking it was difficult to achieve changes in mobility and motor planning despite practice and education. Pt encouraged to continue to work on STS and AMB at home daily to avoid decline. Pt now DC from our services.    Personal Factors and Comorbidities Past/Current Experience    Examination-Activity Limitations Dressing;Transfers;Bend;Locomotion Level;Stairs;Stand    Examination-Participation Restrictions Cleaning;Shop;Meal Prep;[facility] Stability/Clinical Decision Making Evolving/Moderate complexity    Rehab Potential  Good    Clinical Impairments Affecting Rehab Potential (+)  motivation, social support (-) age, chronicity of pain, fairly sedentary lifestyle    PT Frequency 2x / week    PT Duration 12 weeks    PT Treatment/Interventions Aquatic Therapy;Moist Heat;Functional mobility training;Dry needling;Neuromuscular re-education;Traction;Ultrasound;Iontophoresis 1m/ml Dexamethasone;ADLs/Self Care Home Management;Cryotherapy;Electrical Stimulation;Therapeutic exercise;Therapeutic activities;Patient/family education;Taping;Gait training;Stair training;DME Instruction;Balance training;Passive range of motion    PT Next Visit Plan Progress standing functional interventions as appropriate, updated HEP, assess hip mobility for potential restrictions   PT Home Exercise Plan Eval: seated heel raises, toe raises, and STS from elevated surface. 01/04/2022=Access Code: [email or web address] URL: [email or web address]    Consulted and Agree with Plan of Care Patient;Family member/caregiver    Family Member Consulted [place]             06/01/22, 11:20 AM  11:20 AM, 06/01/22  11:20 AM, 06/01/22 AEtta Grandchild PT, DPT Physical Therapist - [facility] [phone]([facility]

## 2022-06-01 NOTE — Therapy (Signed)
OUTPATIENT SPEECH LANGUAGE PATHOLOGY TREATMENT NOTE  Patient Name: Cassidy Hernandez MRN: [id number] DOB:1940-11-19, 81 y.o., female Today's Date: 06/01/2022    REFERRING PROVIDER: Merrilee Seashore, NP     End of Session - 06/01/22 1111     Visit Number 39    Number of Visits 41    Date for SLP Re-Evaluation 06/14/22    Authorization Type BCBS MCR; $10 copay    Authorization - Visit Number 9    Progress Note Due on Visit 10    SLP Start Time 1018   pt arrived late   SLP Stop Time  1100    SLP Time Calculation (min) 42 min    Activity Tolerance Patient tolerated treatment well                 Past Medical History:  Diagnosis Date   Anginal pain ([place])    Aortic atherosclerosis    Cancer ([place])    Carotid artery stenosis 02/01/2010   a.) Doppler 35/00/9381: 82% LICA and 99% RICA. b.) Doppler 37/16/9678: >93% LICA and 81% RICA. c.) Doppler 10/25/16: >70% Bilater ICAs   Chronic bilateral low back pain with left-sided sciatica    CKD (chronic kidney disease), stage III (HCC)    Coronary artery disease    a.) LHC 12/24/2014 -> small LAD system; diffuse CTO of LAD. 60% RCA. LM and LCx with no sig disease. no intervention; med mgmt.   Degenerative arthritis    Diastolic dysfunction    a.) TTE 09/20/2018: EF 55%, G1DD, mild LAE and RVE, triv-mild panvalvular regurgitation.   Dysarthria    GERD (gastroesophageal reflux disease)    Hepatic steatosis    History of kidney stones    HLD (hyperlipidemia)    Hypertension    Kidney stone    Lumbar radiculopathy    Major depression in remission ([place])    PVD (peripheral vascular disease) ([place])    Renal cyst, right 06/02/2015   a.) CT 06/02/2015 --> 4.4 cm RIGHT renal mass; MRI recommended. b.) MRI 06/16/2015 --> 3.8 x 4.2 x 4.0 cm proteinaceous/hemmorhagic cyst.   Subclavian steal syndrome    a.) LHC 12/24/2014 --> tight eccentric 90% ostial stenosis with damping.   T2DM (type 2 diabetes mellitus) [facility])    Past Surgical History:   Procedure Laterality Date   ABDOMINAL HYSTERECTOMY     AUGMENTATION MAMMAPLASTY Bilateral 1980   BACK SURGERY  09/2018   BICEPT TENODESIS  06/10/2021   Procedure: BICEPS TENODESIS;  Surgeon: Corky Mull, MD;  Location: [facility];  Service: Orthopedics;;   CARDIAC CATHETERIZATION Left 12/24/2014   Procedure: LEFT HEARD CATHETERIZATION; Location: [facility]; Surgeon: Laurence Aly, MD   CHOLECYSTECTOMY     COLONOSCOPY N/A 02/11/2016   Procedure: COLONOSCOPY;  Surgeon: Manya Silvas, MD;  Location: [facility]COPY;  Service: Endoscopy;  Laterality: N/A;   IR CT HEAD LTD  11/16/2021   IR CT HEAD LTD  11/16/2021   IR INTRAVSC STENT CERV CAROTID W/O EMB-PROT MOD SED INC ANGIO  11/18/2021   IR PERCUTANEOUS ART THROMBECTOMY/INFUSION INTRACRANIAL INC DIAG ANGIO  11/16/2021   RADIOLOGY WITH ANESTHESIA N/A 11/16/2021   Procedure: IR WITH ANESTHESIA- CODE STROKE;  Surgeon: Radiologist, Medication, MD;  Location: [facility];  Service: Radiology;  Laterality: N/A;   REVERSE SHOULDER ARTHROPLASTY Right 06/10/2021   Procedure: REVERSE SHOULDER ARTHROPLASTY;  Surgeon: Corky Mull, MD;  Location: [facility];  Service: Orthopedics;  Laterality: Right;   Patient Active Problem List   Diagnosis Date Noted  Benign hypertension with CKD (chronic kidney disease) stage III ([place]) 03/02/2022   CAD (coronary artery disease) 03/02/2022   Hypercholesteremia 03/02/2022   Right hemiparesis ([place]) 12/16/2021   Acute ischemic left MCA stroke ([employer]) 11/16/2021   Middle cerebral artery embolism, 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   Status post reverse arthroplasty of shoulder, right 06/10/2021   Traumatic complete tear of right rotator cuff 02/26/2021   Major depression in remission ([place]) 06/25/2020   Dysarthria 09/20/2018   Gastroesophageal reflux disease without esophagitis 09/20/2018   Lumbar radiculopathy 09/20/2018   Healthcare maintenance 05/02/2018   Controlled type 2 diabetes mellitus with stage 3 chronic kidney disease, without long-term  current use of insulin ([place]) 01/30/2018   Chronic bilateral low back pain with left-sided sciatica 12/07/2016   Hepatic steatosis 11/23/2015   Carotid artery disease ([place]) 01/06/2010    ONSET DATE: 11/16/21   REFERRING DIAG: L MCA CVA   HPI: Pt is a 81 y.o. 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 Patient is s/p thrombectomy with left ICA stent placement 11/16/21. She completed inpatient rehabilitation at [facility] 11/25/21-12/07/21 and was discharged home with family. Patient known to this clinic from prior course of voice therapy in 2018. Pt with documented hx of voice and speech changes (dysarthria) per videostroboscopy 12/26/17; did not attend therapy.   THERAPY DIAG:  Dysarthria and anarthria  Aphasia  Rationale for Evaluation and Treatment Rehabilitation  SUBJECTIVE: Patient arrived late, shortly after SLP placed call to her  Pt accompanied by: sister Rod Holler  PAIN: No Are you having pain? Yes: NPRS scale: 10/10 Pain location: foot, when walking     OBJECTIVE:   TODAY'S TREATMENT: Facilitated mod complex conversation with use of dysarthria compensations and focus on vocal intensity. Patient required occasional min-mod cues to maintain volume >70 dB. Prior to making phone call with her daughter, pt verbalized that she would use strategies on the call with initial reminder from SLP. Call with granddaughter was successful, with pt maintaining average 73 dB and no requests for repeats necessary; speech was 95% intelligible to this listener.  PATIENT EDUCATION: Education details: family should remind pt to use louder voice when she forgets Person educated: Patient Education method: Explanation, Demonstration, and Verbal cues Education comprehension: verbalized understanding and needs further education    SLP Short Term Goals - 04/04/22 1541       SLP SHORT TERM GOAL #5   Title Pt will write sentences of 5-7 words 90% accuracy (self correction allowed)  with occasional min cues.    Time 10    Period --   sessions   Status Achieved      Additional Short Term Goals   Additional Short Term Goals Yes      SLP SHORT TERM GOAL #6   Title Pt will use intelligibility strategies (loudness, breath support, overarticulation) for sentence level responses 90% accuracy with min cues.    Time 10    Period --   sessions   Status Achieved      SLP SHORT TERM GOAL #7   Title Pt will use aphasia compensations in 10 minutes moderately complex conversation with fewer than 3 listener questions necessary for clarification of message.    Time 10    Period --   sessions   Status Achieved      SLP SHORT TERM GOAL #8   Title Patient will complete HEP for voice building with rare min cues.    Time 10   sessions  Status New      SLP SHORT TERM GOAL  #9   TITLE Pt will maintain average >72 dB and intelligibility >95%  in 5 minutes conversation.    Time 10   sessions   Status New             SLP Long Term Goals - 04/04/22 1544     SLP LONG TERM GOAL #3   Title Pt will demonstrate reading comprehension of mod complex short story or articles of interest with technology supports (text-to-speech, ereader, etc) by generating summary or answering questions >80% accuracy.    Baseline 02/23/22: Reads/comprehends simple paragraphs unassisted. 04/04/22 comprehends mod complex paragraphs with mod A    Time --    Period Weeks    Status Deferred 04/26/22 per pt request     SLP LONG TERM GOAL #4   Title Pt will self-advocate for communication needs in community or family outings (per her report) to increase confidence in social interactions.    Time 12    Period Weeks    Status On-going      SLP LONG TERM GOAL #5   Title Pt will report reduced frustration with communication breakdowns by implementing aphasia compensation outside [place] room.    Baseline 02/24/22: frustrated 3 times per day on average    Time 12    Period Weeks    Status Achieved      SLP LONG  TERM GOAL #6   Title Pt will use intelligibility strategies (loudness, breath support, overarticulation) 80% accuracy in 10 minutes conversation with modified independence.    Time 12    Period Weeks    Status On-going                   Plan    Clinical Impression Statement Pt continues with mild aphasia and mild-moderate dysarthria. Patient continues to require cues for effort level in structured tasks, however with initial reminder today, pt used loud voice during phone call with her granddaughter, without any requests for repeats. Plan for d/c on Nov. 1.  Intensity based cues and pausing remain most effective strategies. Patient agrees her voice and intelligibility is her greatest concern at this time. Pt is progressing toward goals and benefits from continued skilled ST intervention per plan of care.    Speech Therapy Frequency 2x / week    Duration 2 weeks    Treatment/Interventions SLP instruction and feedback;Multimodal communcation approach;Patient/family education;Functional tasks;Cueing hierarchy;Compensatory techniques;Compensatory strategies    Potential to Achieve Goals Good    Potential Considerations Family/community support    SLP Home Exercise Plan TalkPath Therapy    Consulted and Agree with Plan of Care Patient             Deneise Lever, MS, CCC-SLP Speech-Language Pathologist [phone]

## 2022-06-01 NOTE — Telephone Encounter (Signed)
Placed call to patient re: missed appointment at 10am; reminder left on voicemail re: PT appointment at 82.  Deneise Lever, [place], Actor [phone]

## 2022-06-06 ENCOUNTER — Ambulatory Visit: Payer: Medicare Other

## 2022-06-06 ENCOUNTER — Encounter: Payer: Medicare Other | Admitting: Speech Pathology

## 2022-06-08 ENCOUNTER — Ambulatory Visit: Payer: Medicare Other

## 2022-06-08 ENCOUNTER — Ambulatory Visit: Payer: Medicare Other | Admitting: Physical Therapy

## 2022-06-08 ENCOUNTER — Encounter: Payer: Medicare Other | Admitting: Speech Pathology

## 2022-06-08 ENCOUNTER — Ambulatory Visit: Payer: Medicare Other | Attending: Nurse Practitioner | Admitting: Speech Pathology

## 2022-06-08 DIAGNOSIS — R471 Dysarthria and anarthria: Secondary | ICD-10-CM | POA: Diagnosis not present

## 2022-06-08 DIAGNOSIS — R4701 Aphasia: Secondary | ICD-10-CM | POA: Insufficient documentation

## 2022-06-08 NOTE — Therapy (Signed)
OUTPATIENT SPEECH LANGUAGE PATHOLOGY TREATMENT NOTE AND DISCHARGE SUMMARY Patient Name: Cassidy Hernandez MRN: [id number] DOB:[birth year], 81 y.o., female Today's Date: 06/08/2022   SPEECH THERAPY DISCHARGE SUMMARY  Visits from Start of Care: 40  Current functional level related to goals / functional outcomes: Patient met 2/2 active STGs and 2/3 active LTGs this reporting period. Overall, she met 9/9 STG and 4/6 LTGs during her course of therapy.    Remaining deficits: Mild aphasia and mild-moderate dysarthria persist   Education / Equipment: Aphasia group, activities to support language and communication   Patient agrees to discharge. Patient goals were partially met. Patient is being discharged due to being pleased with the current functional level.[name]    REFERRING PROVIDER: Merrilee Seashore, NP     End of Session - 06/08/22 1025     Visit Number 40    Number of Visits 54    Date for SLP Re-Evaluation 06/14/22    Authorization Type BCBS MCR; $10 copay    Authorization - Visit Number 10    Progress Note Due on Visit 10    SLP Start Time 1005    SLP Stop Time  1100    SLP Time Calculation (min) 55 min    Activity Tolerance Patient tolerated treatment well                 Past Medical History:  Diagnosis Date   Anginal pain ([place])    Aortic atherosclerosis    Cancer ([place])    Carotid artery stenosis 02/01/2010   a.) Doppler 42/68/3419: 62% LICA and 22% RICA. b.) Doppler 97/98/9211: >94% LICA and 17% RICA. c.) Doppler 10/25/16: >70% Bilater ICAs   Chronic bilateral low back pain with left-sided sciatica    CKD (chronic kidney disease), stage III (HCC)    Coronary artery disease    a.) LHC 12/24/2014 -> small LAD system; diffuse CTO of LAD. 60% RCA. LM and LCx with no sig disease. no intervention; med mgmt.   Degenerative arthritis    Diastolic dysfunction    a.) TTE 09/20/2018: EF 55%, G1DD, mild LAE and RVE, triv-mild panvalvular regurgitation.   Dysarthria    GERD  (gastroesophageal reflux disease)    Hepatic steatosis    History of kidney stones    HLD (hyperlipidemia)    Hypertension    Kidney stone    Lumbar radiculopathy    Major depression in remission ([place])    PVD (peripheral vascular disease) ([place])    Renal cyst, right 06/02/2015   a.) CT 06/02/2015 --> 4.4 cm RIGHT renal mass; MRI recommended. b.) MRI 06/16/2015 --> 3.8 x 4.2 x 4.0 cm proteinaceous/hemmorhagic cyst.   Subclavian steal syndrome    a.) LHC 12/24/2014 --> tight eccentric 90% ostial stenosis with damping.   T2DM (type 2 diabetes mellitus) [facility])    Past Surgical History:  Procedure Laterality Date   ABDOMINAL HYSTERECTOMY     AUGMENTATION MAMMAPLASTY Bilateral 1980   BACK SURGERY  09/2018   BICEPT TENODESIS  06/10/2021   Procedure: BICEPS TENODESIS;  Surgeon: Corky Mull, MD;  Location: [facility];  Service: Orthopedics;;   CARDIAC CATHETERIZATION Left 12/24/2014   Procedure: LEFT HEARD CATHETERIZATION; Location: [facility]; Surgeon: Laurence Aly, MD   CHOLECYSTECTOMY     COLONOSCOPY N/A 02/11/2016   Procedure: COLONOSCOPY;  Surgeon: Manya Silvas, MD;  Location: [facility] ENDOSCOPY;  Service: Endoscopy;  Laterality: N/A;   IR CT HEAD LTD  11/16/2021   IR CT HEAD LTD  11/16/2021   IR  INTRAVSC STENT CERV CAROTID W/O EMB-PROT MOD SED INC ANGIO  11/18/2021   IR PERCUTANEOUS ART THROMBECTOMY/INFUSION INTRACRANIAL INC DIAG ANGIO  11/16/2021   RADIOLOGY WITH ANESTHESIA N/A 11/16/2021   Procedure: IR WITH ANESTHESIA- CODE STROKE;  Surgeon: Radiologist, Medication, MD;  Location: [facility];  Service: Radiology;  Laterality: N/A;   REVERSE SHOULDER ARTHROPLASTY Right 06/10/2021   Procedure: REVERSE SHOULDER ARTHROPLASTY;  Surgeon: Corky Mull, MD;  Location: [facility];  Service: Orthopedics;  Laterality: Right;   Patient Active Problem List   Diagnosis Date Noted   Benign hypertension with CKD (chronic kidney disease) stage III ([place]) 03/02/2022   CAD (coronary artery disease) 03/02/2022    Hypercholesteremia 03/02/2022   Right hemiparesis ([place]) 12/16/2021   Acute ischemic left MCA stroke ([place]) 11/16/2021   Middle cerebral artery embolism, 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   Status post reverse arthroplasty of shoulder, right 06/10/2021   Traumatic complete tear of right rotator cuff 02/26/2021   Major depression in remission ([place]) 06/25/2020   Dysarthria 09/20/2018   Gastroesophageal reflux disease without esophagitis 09/20/2018   Lumbar radiculopathy 09/20/2018   Healthcare maintenance 05/02/2018   Controlled type 2 diabetes mellitus with stage 3 chronic kidney disease, without long-term current use of insulin ([place]) 01/30/2018   Chronic bilateral low back pain with left-sided sciatica 12/07/2016   Hepatic steatosis 11/23/2015   Carotid artery disease ([place]) 01/06/2010    ONSET DATE: 11/16/21   REFERRING DIAG: L MCA CVA   HPI: Pt is a 81 y.o. 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 Patient is s/p thrombectomy with left ICA stent placement 11/16/21. She completed inpatient [facility] at [facility] 11/25/21-12/07/21 and was discharged home with family. Patient known to this clinic from prior course of voice therapy in 2018. Pt with documented hx of voice and speech changes (dysarthria) per videostroboscopy 12/26/17; did not attend therapy.   THERAPY DIAG:  Dysarthria and anarthria  Aphasia  Rationale for Evaluation and Treatment Rehabilitation  SUBJECTIVE: Patient arrived late, shortly after SLP placed call to her  Pt accompanied by: sister Rod Holler  PAIN: No Are you having pain? Yes: NPRS scale: 10/10 Pain location: foot, when walking     OBJECTIVE:   TODAY'S TREATMENT: Readministered Communication Effectiveness Survey, with improvement in pt's self-assessment of communication skills (11 out of 32 at initial assessment on 12/16/21, improved to 23 out of 32 today, with higher score indicating better effectiveness). Patient averaged 72 dB in 5 minute  conversation with 95% intelligibility. She continued use of strategies including slow rate, louder speech at conversation level for 15 minutes with min cues necessary at times. Patient reports she is pleased with current functional level and sister notes improved confidence communicating with family members and on the phone.   PATIENT EDUCATION: Education details: activities to support speech/language at home Person educated: Patient Education method: Explanation, Demonstration, and Verbal cues Education comprehension: verbalized understanding and needs further education    SLP Short Term Goals - 04/04/22 1541       SLP SHORT TERM GOAL #5   Title Pt will write sentences of 5-7 words 90% accuracy (self correction allowed) with occasional min cues.    Time 10    Period --   sessions   Status Achieved      Additional Short Term Goals   Additional Short Term Goals Yes      SLP SHORT TERM GOAL #6   Title Pt will use intelligibility strategies (loudness, breath support, overarticulation) for sentence  level responses 90% accuracy with min cues.    Time 10    Period --   sessions   Status Achieved      SLP SHORT TERM GOAL #7   Title Pt will use aphasia compensations in 10 minutes moderately complex conversation with fewer than 3 listener questions necessary for clarification of message.    Time 10    Period --   sessions   Status Achieved      SLP SHORT TERM GOAL #8   Title Patient will complete HEP for voice building with rare min cues.    Time 10   sessions   Status Achieved      SLP SHORT TERM GOAL  #9   TITLE Pt will maintain average >72 dB and intelligibility >95%  in 5 minutes conversation.    Time 10   sessions   Status Achieved            SLP Long Term Goals - 04/04/22 1544     SLP LONG TERM GOAL #3   Title Pt will demonstrate reading comprehension of mod complex short story or articles of interest with technology supports (text-to-speech, ereader, etc) by  generating summary or answering questions >80% accuracy.    Baseline 02/23/22: Reads/comprehends simple paragraphs unassisted. 04/04/22 comprehends mod complex paragraphs with mod A    Time --    Period Weeks    Status Deferred 04/26/22 per pt request     SLP LONG TERM GOAL #4   Title Pt will self-advocate for communication needs in community or family outings (per her report) to increase confidence in social interactions.    Time 12    Period Weeks    Status Achieved     SLP LONG TERM GOAL #5   Title Pt will report reduced frustration with communication breakdowns by implementing aphasia compensation outside [place] room.    Baseline 02/24/22: frustrated 3 times per day on average    Time 12    Period Weeks    Status Achieved      SLP LONG TERM GOAL #6   Title Pt will use intelligibility strategies (loudness, breath support, overarticulation) 80% accuracy in 10 minutes conversation with modified independence.    Time 12    Period Weeks    Status Partially met (requires min cues)                  Plan    Clinical Impression Statement Pt continues with mild aphasia and mild-moderate dysarthria. Patient is able to use intelligibility strategies at conversation level for >95% intelligibility, however requires occasional reminders from communication partners to increase volume. During course of ST, patient's auditory comprehension skills have improved to [facility], and verbal expression has improved from impairment at sentence level to be functional at the level of moderately complex conversation. 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     Speech Therapy Frequency D/c   Duration D/c    Treatment/Interventions SLP instruction and feedback;Multimodal communcation approach;Patient/family education;Functional  tasks;Cueing hierarchy;Compensatory techniques;Compensatory strategies    Potential to Achieve Goals Good    Potential Considerations Family/community support    SLP Home Exercise Plan TalkPath Therapy    Consulted and Agree with Plan of Care Patient             Deneise Lever, [place], [place]  Speech-Language Pathologist [phone]

## 2022-06-13 ENCOUNTER — Encounter: Payer: Medicare Other | Admitting: Speech Pathology

## 2022-06-13 ENCOUNTER — Ambulatory Visit: Payer: Medicare Other

## 2022-06-13 ENCOUNTER — Ambulatory Visit: Payer: Medicare Other | Admitting: Physical Therapy

## 2022-06-15 ENCOUNTER — Encounter: Payer: Medicare Other | Admitting: Speech Pathology

## 2022-06-15 ENCOUNTER — Ambulatory Visit: Payer: Medicare Other

## 2022-06-15 ENCOUNTER — Ambulatory Visit: Payer: Medicare Other | Admitting: Physical Therapy

## 2022-06-20 ENCOUNTER — Encounter: Payer: Medicare Other | Admitting: Speech Pathology

## 2022-06-20 ENCOUNTER — Ambulatory Visit: Payer: Medicare Other

## 2022-06-22 ENCOUNTER — Encounter: Payer: Medicare Other | Admitting: Speech Pathology

## 2022-06-22 ENCOUNTER — Ambulatory Visit: Payer: Medicare Other

## 2022-06-27 ENCOUNTER — Ambulatory Visit: Payer: Medicare Other

## 2022-06-27 ENCOUNTER — Ambulatory Visit: Payer: Medicare Other | Admitting: Physical Therapy

## 2022-06-27 ENCOUNTER — Encounter: Payer: Medicare Other | Admitting: Speech Pathology

## 2022-06-29 ENCOUNTER — Ambulatory Visit: Payer: Medicare Other | Admitting: Physical Therapy

## 2022-06-29 ENCOUNTER — Encounter: Payer: Medicare Other | Admitting: Speech Pathology

## 2022-06-29 ENCOUNTER — Ambulatory Visit: Payer: Medicare Other

## 2022-07-04 ENCOUNTER — Encounter: Payer: Medicare Other | Admitting: Speech Pathology

## 2022-07-04 ENCOUNTER — Ambulatory Visit: Payer: Medicare Other

## 2022-07-06 ENCOUNTER — Encounter: Payer: Medicare Other | Admitting: Speech Pathology

## 2022-07-06 ENCOUNTER — Ambulatory Visit: Payer: Medicare Other

## 2022-07-11 ENCOUNTER — Encounter: Payer: Medicare Other | Admitting: Speech Pathology

## 2022-07-11 ENCOUNTER — Ambulatory Visit: Payer: Medicare Other

## 2022-07-13 ENCOUNTER — Encounter: Payer: Medicare Other | Admitting: Speech Pathology

## 2022-07-13 ENCOUNTER — Ambulatory Visit: Payer: Medicare Other

## 2022-07-18 ENCOUNTER — Encounter: Payer: Medicare Other | Admitting: Speech Pathology

## 2022-07-18 ENCOUNTER — Ambulatory Visit: Payer: Medicare Other

## 2022-07-20 ENCOUNTER — Ambulatory Visit: Payer: Medicare Other

## 2022-07-20 ENCOUNTER — Encounter: Payer: Medicare Other | Admitting: Speech Pathology

## 2022-07-25 ENCOUNTER — Encounter: Payer: Medicare Other | Admitting: Speech Pathology

## 2022-07-25 ENCOUNTER — Ambulatory Visit: Payer: Medicare Other

## 2022-07-27 ENCOUNTER — Encounter: Payer: Medicare Other | Admitting: Speech Pathology

## 2022-07-27 ENCOUNTER — Ambulatory Visit: Payer: Medicare Other

## 2022-08-03 ENCOUNTER — Ambulatory Visit: Payer: Medicare Other | Admitting: Physical Therapy

## 2022-08-10 ENCOUNTER — Ambulatory Visit: Payer: Medicare Other | Admitting: Physical Therapy

## 2022-08-15 ENCOUNTER — Ambulatory Visit: Payer: Medicare Other | Admitting: Physical Therapy

## 2022-08-17 ENCOUNTER — Ambulatory Visit: Payer: Medicare Other | Admitting: Physical Therapy

## 2022-08-22 ENCOUNTER — Ambulatory Visit: Payer: Medicare Other | Admitting: Physical Therapy

## 2022-08-24 ENCOUNTER — Ambulatory Visit: Payer: Medicare Other | Admitting: Physical Therapy

## 2022-08-29 ENCOUNTER — Ambulatory Visit: Payer: Medicare Other | Admitting: Physical Therapy

## 2022-08-31 ENCOUNTER — Ambulatory Visit: Payer: Medicare Other | Admitting: Physical Therapy

## 2022-09-05 ENCOUNTER — Ambulatory Visit: Payer: Medicare Other | Admitting: Physical Therapy

## 2022-09-07 ENCOUNTER — Ambulatory Visit: Payer: Medicare Other | Admitting: Physical Therapy

## 2022-09-12 ENCOUNTER — Ambulatory Visit: Payer: Medicare Other | Admitting: Physical Therapy

## 2022-09-14 ENCOUNTER — Ambulatory Visit: Payer: Medicare Other | Admitting: Physical Therapy

## 2022-09-19 ENCOUNTER — Ambulatory Visit: Payer: Medicare Other | Admitting: Physical Therapy

## 2022-09-21 ENCOUNTER — Ambulatory Visit: Payer: Medicare Other | Admitting: Physical Therapy

## 2022-09-26 ENCOUNTER — Ambulatory Visit: Payer: Medicare Other | Admitting: Physical Therapy

## 2022-09-28 ENCOUNTER — Ambulatory Visit: Payer: Medicare Other | Admitting: Physical Therapy

## 2022-10-03 ENCOUNTER — Ambulatory Visit: Payer: Medicare Other | Admitting: Physical Therapy

## 2022-10-05 ENCOUNTER — Ambulatory Visit: Payer: Medicare Other | Admitting: Physical Therapy

## 2022-10-10 ENCOUNTER — Ambulatory Visit: Payer: Medicare Other | Admitting: Physical Therapy

## 2022-10-12 ENCOUNTER — Ambulatory Visit: Payer: Medicare Other | Admitting: Physical Therapy

## 2022-10-17 ENCOUNTER — Ambulatory Visit: Payer: Medicare Other | Admitting: Physical Therapy

## 2022-10-19 ENCOUNTER — Ambulatory Visit: Payer: Medicare Other | Admitting: Physical Therapy

## 2022-10-24 ENCOUNTER — Ambulatory Visit: Payer: Medicare Other | Admitting: Physical Therapy

## 2022-10-26 ENCOUNTER — Ambulatory Visit: Payer: Medicare Other | Admitting: Physical Therapy

## 2022-12-19 ENCOUNTER — Encounter: Payer: Self-pay | Admitting: Emergency Medicine

## 2022-12-19 ENCOUNTER — Observation Stay
Admission: EM | Admit: 2022-12-19 | Discharge: 2022-12-21 | Disposition: A | Discharge status: Home / Self Care | Payer: Medicare Other | Attending: Internal Medicine | Admitting: Internal Medicine

## 2022-12-19 ENCOUNTER — Other Ambulatory Visit: Payer: Self-pay

## 2022-12-19 ENCOUNTER — Emergency Department: Payer: Medicare Other

## 2022-12-19 DIAGNOSIS — I130 Hypertensive heart and chronic kidney disease with heart failure and stage 1 through stage 4 chronic kidney disease, or unspecified chronic kidney disease: Secondary | ICD-10-CM | POA: Diagnosis not present

## 2022-12-19 DIAGNOSIS — K219 Gastro-esophageal reflux disease without esophagitis: Secondary | ICD-10-CM | POA: Diagnosis present

## 2022-12-19 DIAGNOSIS — E7800 Pure hypercholesterolemia, unspecified: Secondary | ICD-10-CM | POA: Diagnosis present

## 2022-12-19 DIAGNOSIS — E1122 Type 2 diabetes mellitus with diabetic chronic kidney disease: Secondary | ICD-10-CM | POA: Diagnosis not present

## 2022-12-19 DIAGNOSIS — Z79899 Other long term (current) drug therapy: Secondary | ICD-10-CM | POA: Insufficient documentation

## 2022-12-19 DIAGNOSIS — E876 Hypokalemia: Secondary | ICD-10-CM | POA: Diagnosis present

## 2022-12-19 DIAGNOSIS — W0110XA Fall on same level from slipping, tripping and stumbling with subsequent striking against unspecified object, initial encounter: Secondary | ICD-10-CM | POA: Diagnosis not present

## 2022-12-19 DIAGNOSIS — R2689 Other abnormalities of gait and mobility: Secondary | ICD-10-CM

## 2022-12-19 DIAGNOSIS — Z8673 Personal history of transient ischemic attack (TIA), and cerebral infarction without residual deficits: Secondary | ICD-10-CM | POA: Diagnosis not present

## 2022-12-19 DIAGNOSIS — I5032 Chronic diastolic (congestive) heart failure: Secondary | ICD-10-CM | POA: Diagnosis not present

## 2022-12-19 DIAGNOSIS — I63512 Cerebral infarction due to unspecified occlusion or stenosis of left middle cerebral artery: Secondary | ICD-10-CM | POA: Diagnosis present

## 2022-12-19 DIAGNOSIS — R296 Repeated falls: Secondary | ICD-10-CM | POA: Diagnosis not present

## 2022-12-19 DIAGNOSIS — N183 Chronic kidney disease, stage 3 unspecified: Secondary | ICD-10-CM | POA: Diagnosis not present

## 2022-12-19 DIAGNOSIS — Z96611 Presence of right artificial shoulder joint: Secondary | ICD-10-CM | POA: Diagnosis not present

## 2022-12-19 DIAGNOSIS — Z7982 Long term (current) use of aspirin: Secondary | ICD-10-CM | POA: Diagnosis not present

## 2022-12-19 DIAGNOSIS — S7002XA Contusion of left hip, initial encounter: Secondary | ICD-10-CM | POA: Diagnosis present

## 2022-12-19 DIAGNOSIS — T148XXA Other injury of unspecified body region, initial encounter: Secondary | ICD-10-CM

## 2022-12-19 DIAGNOSIS — W19XXXA Unspecified fall, initial encounter: Secondary | ICD-10-CM | POA: Diagnosis not present

## 2022-12-19 DIAGNOSIS — I2510 Atherosclerotic heart disease of native coronary artery without angina pectoris: Secondary | ICD-10-CM | POA: Diagnosis present

## 2022-12-19 DIAGNOSIS — R350 Frequency of micturition: Secondary | ICD-10-CM | POA: Diagnosis not present

## 2022-12-19 DIAGNOSIS — I129 Hypertensive chronic kidney disease with stage 1 through stage 4 chronic kidney disease, or unspecified chronic kidney disease: Secondary | ICD-10-CM | POA: Diagnosis present

## 2022-12-19 DIAGNOSIS — S0990XA Unspecified injury of head, initial encounter: Secondary | ICD-10-CM | POA: Diagnosis not present

## 2022-12-19 DIAGNOSIS — I10 Essential (primary) hypertension: Secondary | ICD-10-CM

## 2022-12-19 DIAGNOSIS — Z859 Personal history of malignant neoplasm, unspecified: Secondary | ICD-10-CM | POA: Diagnosis not present

## 2022-12-19 DIAGNOSIS — E1169 Type 2 diabetes mellitus with other specified complication: Secondary | ICD-10-CM | POA: Diagnosis present

## 2022-12-19 LAB — URINALYSIS, ROUTINE W REFLEX MICROSCOPIC
Bilirubin Urine: NEGATIVE
Glucose, UA: NEGATIVE mg/dL
Ketones, ur: NEGATIVE mg/dL
Nitrite: NEGATIVE
Protein, ur: NEGATIVE mg/dL
Specific Gravity, Urine: <1.005 — ABNORMAL LOW (ref 1.005–1.030)
WBC, UA: >50 WBC/hpf (ref 0–5)
pH: 5 (ref 5.0–8.0)

## 2022-12-19 LAB — BASIC METABOLIC PANEL
Anion gap: 12 (ref 5–15)
BUN: 26 mg/dL — ABNORMAL HIGH (ref 8–23)
CO2: 19 mmol/L — ABNORMAL LOW (ref 22–32)
Calcium: 8.2 mg/dL — ABNORMAL LOW (ref 8.9–10.3)
Chloride: 107 mmol/L (ref 98–111)
Creatinine, Ser: 1.14 mg/dL — ABNORMAL HIGH (ref 0.44–1.00)
GFR, Estimated: 47 mL/min — ABNORMAL LOW (ref >60)
Glucose, Bld: 115 mg/dL — ABNORMAL HIGH (ref 70–99)
Potassium: 3.1 mmol/L — ABNORMAL LOW (ref 3.5–5.1)
Sodium: 138 mmol/L (ref 135–145)

## 2022-12-19 LAB — CBC WITH DIFFERENTIAL/PLATELET
Abs Immature Granulocytes: 0.03 10*3/uL (ref 0.00–0.07)
Basophils Absolute: 0.1 10*3/uL (ref 0.0–0.1)
Basophils Relative: 1 %
Eosinophils Absolute: 0.2 10*3/uL (ref 0.0–0.5)
Eosinophils Relative: 2 %
HCT: 34.3 % — ABNORMAL LOW (ref 36.0–46.0)
Hemoglobin: 11.4 g/dL — ABNORMAL LOW (ref 12.0–15.0)
Immature Granulocytes: 0 %
Lymphocytes Relative: 16 %
Lymphs Abs: 1.5 10*3/uL (ref 0.7–4.0)
MCH: 30.5 pg (ref 26.0–34.0)
MCHC: 33.2 g/dL (ref 30.0–36.0)
MCV: 91.7 fL (ref 80.0–100.0)
Monocytes Absolute: 0.8 10*3/uL (ref 0.1–1.0)
Monocytes Relative: 9 %
Neutro Abs: 7 10*3/uL (ref 1.7–7.7)
Neutrophils Relative %: 72 %
Platelets: 241 10*3/uL (ref 150–400)
RBC: 3.74 MIL/uL — ABNORMAL LOW (ref 3.87–5.11)
RDW: 13.6 % (ref 11.5–15.5)
WBC: 9.6 10*3/uL (ref 4.0–10.5)
nRBC: 0 % (ref 0.0–0.2)

## 2022-12-19 LAB — HEMOGLOBIN AND HEMATOCRIT, BLOOD
HCT: 29.7 % — ABNORMAL LOW (ref 36.0–46.0)
Hemoglobin: 9.9 g/dL — ABNORMAL LOW (ref 12.0–15.0)

## 2022-12-19 LAB — CK: Total CK: 150 U/L (ref 38–234)

## 2022-12-19 LAB — MAGNESIUM: Magnesium: 1.4 mg/dL — ABNORMAL LOW (ref 1.7–2.4)

## 2022-12-19 MED ORDER — ONDANSETRON HCL 4 MG PO TABS: 4.0000 mg | ORAL_TABLET | Freq: Four times a day (QID) | ORAL | Status: DC | PRN

## 2022-12-19 MED ORDER — ACETAMINOPHEN 325 MG PO TABS
650.0000 mg | ORAL_TABLET | Freq: Once | ORAL | Status: AC
  Administered 2022-12-19: 650 mg via ORAL
  Filled 2022-12-19: qty 2

## 2022-12-19 MED ORDER — CARVEDILOL 3.125 MG PO TABS
3.1250 mg | ORAL_TABLET | Freq: Two times a day (BID) | ORAL | Status: DC
  Administered 2022-12-19 – 2022-12-21 (×4): 3.125 mg via ORAL
  Filled 2022-12-19 (×4): qty 1

## 2022-12-19 MED ORDER — IOHEXOL 300 MG/ML  SOLN
80.0000 mL | Freq: Once | INTRAMUSCULAR | Status: AC | PRN
  Administered 2022-12-19: 80 mL via INTRAVENOUS

## 2022-12-19 MED ORDER — ONDANSETRON HCL 4 MG/2ML IJ SOLN: 4.0000 mg | Freq: Four times a day (QID) | INTRAMUSCULAR | Status: DC | PRN

## 2022-12-19 MED ORDER — SODIUM CHLORIDE 0.9 % IV SOLN: INTRAVENOUS | Status: DC

## 2022-12-19 MED ORDER — POTASSIUM CHLORIDE CRYS ER 20 MEQ PO TBCR
30.0000 meq | EXTENDED_RELEASE_TABLET | Freq: Two times a day (BID) | ORAL | Status: AC
  Administered 2022-12-19 – 2022-12-20 (×2): 30 meq via ORAL
  Filled 2022-12-19 (×2): qty 2

## 2022-12-19 MED ORDER — OXYCODONE HCL 5 MG PO TABS
5.0000 mg | ORAL_TABLET | Freq: Once | ORAL | Status: AC
  Administered 2022-12-19: 5 mg via ORAL
  Filled 2022-12-19: qty 1

## 2022-12-19 NOTE — Progress Notes (Signed)
Received message:  "Hello Dr. Alvester Morin! I just spoke to [facility] daughter Isaiah Serge. If it wouldn't be too much trouble, she asked you to call her on her cell phone [phone] after you evaluate patient. She is going to run to her mom's house to pick up a few items for her and doesn't want to miss you"  I spoke to patient's daughter Dois Davenport who was unaware patient was being admitted.  I discussed overall plan of care including observation for left hip hematoma as well as discussion with interventional radiology about potential intervention in addition to physical therapy and Occupational Therapy evaluation. I apologize for any potential acute confusion as patient sister was in the room at the time of my initial evaluation with discussion about admission. Daughter Isaiah Serge expressed understanding and is agreeable to overall plan of care at this point Follow

## 2022-12-19 NOTE — ED Provider Notes (Signed)
[facility] Provider Note    Event Date/Time   First MD Initiated Contact with Patient 12/19/22 1234     (approximate)   History   Fall   HPI  CHARLANNE ILG is a 82 y.o. 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  Patient reportedly has frequent falls.  Today she turned around, reports that she lost her balance and landed on her left hip.  She reports that she struck her head in the process.  There was no loss of consciousness.  EMS came to transport her to the emergency department.  She has been unable to bear weight.  There has been no change in mental status.  Patient has a deformity to her left hip according to EMS.  Patient denies headache or neck pain.  She denies any preceding symptoms.  She has not had any chest pain, dizziness, shortness of breath, or any other complaints.   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/2021   Traumatic complete tear of right rotator cuff 02/26/2021   Major depression in remission (HCC) 06/25/2020   Dysarthria 09/20/2018   Gastroesophageal reflux disease without esophagitis 09/20/2018   Lumbar radiculopathy 09/20/2018   Healthcare maintenance 05/02/2018   Controlled type 2 diabetes mellitus with stage 3 chronic kidney disease, without long-term current use of insulin (HCC) 01/30/2018   Chronic bilateral low back pain with left-sided sciatica 12/07/2016    Hepatic steatosis 11/23/2015   Carotid artery disease (HCC) 01/06/2010         Physical Exam   Triage Vital Signs: ED Triage Vitals  Enc Vitals Group     BP 12/19/22 1255 (!) 188/88     Pulse Rate 12/19/22 1255 78     Resp 12/19/22 1254 18     Temp 12/19/22 1254 98 F (36.7 C)     Temp Source 12/19/22 1254 Oral     SpO2 12/19/22 1254 98 %     Weight 12/19/22 1238 174 lb 2.6 oz (79 kg)     Height 12/19/22 1238 5\' 3"  (1.6 m)     Head Circumference --      Peak Flow --      Pain Score 12/19/22 1238 6     Pain Loc --      Pain Edu? --      Excl. in GC? --     Most recent vital signs: Vitals:   12/19/22 1254 12/19/22 1255  BP:  (!) 188/88  Pulse:  78  Resp: 18   Temp: 98 F (36.7 C)   SpO2: 98%     Physical Exam Vitals and nursing note reviewed.  Constitutional:      General: Awake and alert. No acute distress.    Appearance: Normal appearance. The patient is normal weight.  HENT:     Head: Normocephalic and atraumatic.  No Battle sign or raccoon eyes.    Mouth: Mucous membranes are moist.  Eyes:  General: PERRL. Normal EOMs        Right eye: No discharge.        Left eye: No discharge.     Conjunctiva/sclera: Conjunctivae normal.  Cardiovascular:     Rate and Rhythm: Normal rate and regular rhythm.     Pulses: Normal pulses.  Pulmonary:     Effort: Pulmonary effort is normal. No respiratory distress.     Breath sounds: Normal breath sounds.  Abdominal:     Abdomen is soft. There is no abdominal tenderness. No rebound or guarding. No distention. Musculoskeletal:        General: No swelling. Normal range of motion.     Cervical back: Normal range of motion and neck supple.  Pelvis stable.  Large hematoma to the left hip without open wounds.  Patient is able to actively lift her left and right legs off of the stretcher independently, though has pain with passive range of motion of her left hip.  She has normal 2+ pedal pulses.  She has tenderness over her  greater trochanter at the area of the hematoma. Skin:    General: Skin is warm and dry.     Capillary Refill: Capillary refill takes less than 2 seconds.     Findings: No rash.  Neurological:     Mental Status: The patient is awake and alert.      ED Results / Procedures / Treatments   Labs (all labs ordered are listed, but only abnormal results are displayed) Labs Reviewed  BASIC METABOLIC PANEL - Abnormal; Notable for the following components:      Result Value   Potassium 3.1 (*)    CO2 19 (*)    Glucose, Bld 115 (*)    BUN 26 (*)    Creatinine, Ser 1.14 (*)    Calcium 8.2 (*)    GFR, Estimated 47 (*)    All other components within normal limits  CBC WITH DIFFERENTIAL/PLATELET - Abnormal; Notable for the following components:   RBC 3.74 (*)    Hemoglobin 11.4 (*)    HCT 34.3 (*)    All other components within normal limits  URINALYSIS, ROUTINE W REFLEX MICROSCOPIC     EKG     RADIOLOGY I independently reviewed and interpreted imaging and agree with radiologists findings.     PROCEDURES:  Critical Care performed:   Procedures   MEDICATIONS ORDERED IN ED: Medications  iohexol (OMNIPAQUE) 300 MG/ML solution 80 mL (has no administration in time range)  acetaminophen (TYLENOL) tablet 650 mg (650 mg Oral Given 12/19/22 1341)     IMPRESSION / MDM / ASSESSMENT AND PLAN / ED COURSE  I reviewed the triage vital signs and the nursing notes.   Differential diagnosis includes, but is not limited to, hip fracture, hematoma, contusion, intracranial hemorrhage, anemia, cervical spine injury.  Patient is awake and alert, hemodynamically stable and afebrile.  She is answering questions appropriately.  She is at her mental baseline per her sister who is at the bedside.  Patient has obvious swelling over her lateral left hip, but there is no open wounds.  She is able to actively lift her leg off of the stretcher.  X-ray does not reveal a hip fracture.  CT head and  neck obtained per [place] criteria are negative for acute findings.  Her H&H are improved from prior.  She has normal 2+ pedal pulses, normal sensation of bilateral feet.  Normal capillary refill.  I discussed these findings with the patient and  her sister.  I recommended attempted ambulation, and if she is able to ambulate with her normal assist device she may be discharged home.  However, patient was unable to ambulate at running to the tech.  Sister became very upset at the tech, though unclear why.  I was able to diffuse the situation.  I recommended admission to the hospitalist service for inability to ambulate, monitoring her hematoma, PT/OT evaluation, and placement as needed.  Patient and family are in agreement with plan.  Patient was accepted by the hospitalist service.  Dr. Alvester Morin requested a CT of the pelvis for hematoma evaluation given that she is on blood thinners, this was obtained.  Patient accepted by the hospitalist service prior to CT results.  Case was discussed with Dr. Cyril Loosen who agrees with assessment and plan.  Patient's presentation is most consistent with acute presentation with potential threat to life or bodily function.    FINAL CLINICAL IMPRESSION(S) / ED DIAGNOSES   Final diagnoses:  Fall, initial encounter  Hematoma  Unable to bear weight on left lower extremity  Injury of head, initial encounter     Rx / DC Orders   ED Discharge Orders     None        Note:  This document was prepared using Dragon voice recognition software and may include unintentional dictation errors.   Keturah Shavers 12/19/22 1448    Jene Every, MD 12/19/22 [phone]

## 2022-12-19 NOTE — Assessment & Plan Note (Signed)
Baseline hx/o L MCA cva w/ R sided weakness and R foot drop  Likely confounder to recent fall  On ASA, brillinta, atorvastatin  Hold antiplatelet regimen pending CT hip to assess hematoma

## 2022-12-19 NOTE — Assessment & Plan Note (Signed)
K3.1 Replete Check mag level

## 2022-12-19 NOTE — ED Notes (Signed)
Cassidy Hernandez EDT went in room to ambulate pt  She tried to get pt up with assistance  She had placed her shoes on  But pt is weak  and foot is sliding  Sister at bedside  States the EDT was rude  This was not witnessed at the time by this nurse   Provider made aware

## 2022-12-19 NOTE — ED Triage Notes (Signed)
Presents from home s/p fall  Per EMS she lost her balance  Fell   hitting head  and left hip  Positive deformity noted to left leg

## 2022-12-19 NOTE — Assessment & Plan Note (Signed)
No active CP  Hold antiplatelet medication for now pending CT to assess hematoma

## 2022-12-19 NOTE — ED Notes (Signed)
This RN witnessed pts sister become verbally aggressive with tech. Tech has been staying away from pt and assisting this RN with her pts since an earlier disagreement. This RN witnessed the sister come to the other side of the flex area and confront the tech. This RN asked the tech to leave and the pts sister returned to the pts room.

## 2022-12-19 NOTE — Assessment & Plan Note (Signed)
PPI ?

## 2022-12-19 NOTE — Assessment & Plan Note (Signed)
Continue statin pending CK level status post fall

## 2022-12-19 NOTE — Assessment & Plan Note (Signed)
Noted left hip hematoma status post fall On antiplatelet regimen including aspirin and Brilinta CT of the left hip pending to better assess Hgb stable at 11.4 Trend hgb  Transfuse for hgb <7  Hold antiplatelet regimen for now Follow

## 2022-12-19 NOTE — Assessment & Plan Note (Signed)
Positive mechanical fall at home in the setting of confounding risk factors including CVA with right-sided weakness Noted significant left hip contusion with hematoma No overt fracture noted though with significant pain and difficulty with ambulation Plan for PT OT evaluation Fall precautions Follow

## 2022-12-19 NOTE — Progress Notes (Signed)
Discussed CT findings w/ IR MD Gilmer Mor  Will plan for formal consult in am  Observe overnight with some likelihood of self resolution  Compression dressing to affected area  Trend hgb  Notify on call IR if there is any significant decompensation.

## 2022-12-19 NOTE — Assessment & Plan Note (Signed)
Creatinine 1.14 today Appears at baseline Monitor

## 2022-12-19 NOTE — H&P (Signed)
History and Physical    Patient: Cassidy Hernandez ZOX:[id number] DOB: 11-Apr-1941 DOA: 12/19/2022 DOS: the patient was seen and examined on 12/19/2022 PCP: Lauro Regulus, MD  Patient coming from: Home  Chief Complaint:  Chief Complaint  Patient presents with   Fall   HPI: Cassidy Hernandez is a 82 y.o. 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  Patient current lives at home.  History also from her sister.  Per report, patient tripped falling backwards.  No reported head trauma loss consciousness.  Sister states that she saw her falling but was unable to catch her.  Patient denies any weakness or dizziness prior to fall.  Baseline history of CVA within the past year seen by [facility] neurology.  Has noted dysarthria as well as right-sided weakness and foot drop.  Also on aspirin and Brilinta for secondary prevention.  No chest pain or shortness of breath.  No nausea or vomiting.  Has had difficulty with ambulation status post CVA.  No abdominal pain or diarrhea. Presented to the ER afebrile, hemodynamically stable.  White count 9.6, hemoglobin 11.4, platelets 241, creatinine 1.14.  Potassium 3.1.  CT head and C-spine as well as hip plain films grossly stable.  CT of the hip pending. Review of Systems: As mentioned in the history of present illness. All other systems reviewed and are negative. 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 b.) Doppler 12/30/2014: >70% LICA and 70% RICA. c.) Doppler 10/25/16: >70% Bilater ICAs   Chronic bilateral low back pain with left-sided sciatica    CKD (chronic kidney disease), stage III (HCC)    Coronary artery disease    a.) LHC 12/24/2014 -> small LAD  system; diffuse CTO of LAD. 60% RCA. LM and LCx with no sig disease. no intervention; med mgmt.   Degenerative arthritis    Diastolic dysfunction    a.) TTE 09/20/2018: EF 55%, G1DD, mild LAE and RVE, triv-mild panvalvular regurgitation.   Dysarthria    GERD (gastroesophageal reflux disease)    Hepatic steatosis    History of kidney stones    HLD (hyperlipidemia)    Hypertension    Kidney stone    Lumbar radiculopathy    Major depression in remission (HCC)    PVD (peripheral vascular disease) (HCC)    Renal cyst, right 06/02/2015   a.) CT 06/02/2015 --> 4.4 cm RIGHT renal mass; MRI recommended. b.) MRI 06/16/2015 --> 3.8 x 4.2 x 4.0 cm proteinaceous/hemmorhagic cyst.   Subclavian steal syndrome    a.) LHC 12/24/2014 --> tight eccentric 90% ostial stenosis with damping.   T2DM (type 2 diabetes mellitus) [facility])    Past Surgical History:  Procedure Laterality Date   ABDOMINAL HYSTERECTOMY     AUGMENTATION MAMMAPLASTY Bilateral 1980   BACK SURGERY  09/2018   BICEPT TENODESIS  06/10/2021   Procedure: BICEPS TENODESIS;  Surgeon: Christena Flake, MD;  Location: [facility] ORS;  Service: Orthopedics;;   CARDIAC CATHETERIZATION Left 12/24/2014   Procedure: LEFT HEARD CATHETERIZATION; Location: [facility]; Surgeon: Ander Purpura, MD   CHOLECYSTECTOMY     COLONOSCOPY N/A 02/11/2016   Procedure: COLONOSCOPY;  Surgeon: Scot Jun, MD;  Location: [facility] ENDOSCOPY;  Service: Endoscopy;  Laterality: N/A;   IR CT HEAD LTD  11/16/2021   IR CT HEAD LTD  11/16/2021   IR INTRAVSC STENT CERV CAROTID W/O EMB-PROT MOD SED INC ANGIO  11/18/2021   IR PERCUTANEOUS ART THROMBECTOMY/INFUSION INTRACRANIAL INC DIAG ANGIO  11/16/2021   RADIOLOGY WITH ANESTHESIA N/A 11/16/2021   Procedure: IR WITH ANESTHESIA- CODE STROKE;  Surgeon: Radiologist, Medication, MD;  Location: [facility] OR;  Service: Radiology;  Laterality: N/A;   REVERSE SHOULDER ARTHROPLASTY Right 06/10/2021   Procedure: REVERSE SHOULDER ARTHROPLASTY;  Surgeon: Christena Flake, MD;  Location: [facility] ORS;  Service: Orthopedics;  Laterality: Right;   Social History:  reports that she has never smoked. She has never used smokeless tobacco. She reports that she does not drink alcohol and does not use drugs.  Allergies  Allergen Reactions   Sulfa Antibiotics Other (See Comments)    Family History  Problem Relation Age of Onset   Heart attack Mother    Heart disease Mother    Heart attack Father    Heart disease Father    Breast cancer Neg Hx     Prior to Admission medications   Medication Sig Start Date End Date Taking? Authorizing Provider  acetaminophen (TYLENOL) 325 MG tablet Take 650 mg by mouth every 8 (eight) hours as needed for moderate pain or mild pain (Back).   Yes [provider]  aspirin 81 MG chewable tablet Chew 1 tablet (81 mg total) by mouth daily. 11/26/21  Yes de Saintclair Halsted, Cortney E, NP  atorvastatin (LIPITOR) 80 MG tablet Take 80 mg by mouth daily. 02/13/22  Yes [provider]  carvedilol (COREG) 3.125 MG tablet Take 3.125 mg by mouth 2 (two) times daily. 10/05/19  Yes [provider]  chlorthalidone (HYGROTON) 25 MG tablet Take 25 mg by mouth daily. 12/30/21  Yes [provider]  docusate sodium (COLACE) 100 MG capsule Take 1 capsule (100 mg total) by mouth 2 (two) times daily. Patient taking differently: Take 100 mg by mouth 2 (two) times daily as needed for mild constipation. 06/11/21  Yes Anson Oregon, PA-C  escitalopram (LEXAPRO) 20 MG tablet Take 20 mg by mouth daily. 05/13/21  Yes [provider]  ezetimibe (ZETIA) 10 MG tablet Take 10 mg by mouth daily. 06/23/18  Yes [provider]  isosorbide mononitrate (IMDUR) 30 MG 24 hr tablet Take 30 mg by mouth daily. 01/20/22 01/20/23 Yes [provider]  losartan (COZAAR) 100 MG tablet Take 100 mg by mouth daily.    Yes [provider]  magnesium chloride (SLOW-MAG) 64 MG TBEC SR tablet Take 1 tablet by mouth daily.   Yes  [provider]  Multiple Vitamin (MULTIVITAMIN) capsule Take 1 capsule by mouth daily.   Yes [provider]  nitroGLYCERIN (NITROSTAT) 0.4 MG SL tablet Place 0.4 mg under the tongue every 5 (five) minutes as needed for chest pain. 04/13/18 09/24/28 Yes [provider]  pantoprazole (PROTONIX) 40 MG tablet Take 40 mg by mouth daily. 01/05/22  Yes [provider]  ticagrelor (BRILINTA) 90 MG TABS tablet Take 1 tablet (90 mg total) by mouth 2 (two) times daily. 11/25/21  Yes de Saintclair Halsted, Cortney E, NP  omeprazole (PRILOSEC) 20 MG capsule Take 20 mg by mouth daily.  Patient not taking: Reported on 12/19/2022    [provider]  rosuvastatin (CRESTOR) 20 MG tablet Take 20 mg by mouth daily.  Patient not taking: Reported on 12/19/2022 12/01/14   [provider]  Physical Exam: Vitals:   12/19/22 1238 12/19/22 1254 12/19/22 1255  BP:   (!) 188/88  Pulse:   78  Resp:  18   Temp:  98 F (36.7 C)   TempSrc:  Oral   SpO2:  98%   Weight: 79 kg    Height: 5\' 3"  (1.6 m)     Physical Exam Constitutional:      Appearance: She is normal weight.  HENT:     Head: Normocephalic and atraumatic.     Nose: Nose normal.  Eyes:     Pupils: Pupils are equal, round, and reactive to light.  Cardiovascular:     Rate and Rhythm: Normal rate and regular rhythm.  Pulmonary:     Effort: Pulmonary effort is normal.  Abdominal:     General: Bowel sounds are normal.  Musculoskeletal:     Comments: Positive left hip swelling and tenderness palpation  Neurological:     Comments: Positive dysarthria and right-sided weakness at baseline  Psychiatric:        Mood and Affect: Mood normal.     Data Reviewed:  There are no new results to review at this time. CT PELVIS W CONTRAST CLINICAL DATA:  Fall with hematoma  EXAM: CT PELVIS WITH CONTRAST  TECHNIQUE: Multidetector CT imaging of the pelvis was performed using the standard protocol following the  bolus administration of intravenous contrast.  RADIATION DOSE REDUCTION: This exam was performed according to the departmental dose-optimization program which includes automated exposure control, adjustment of the mA and/or kV according to patient size and/or use of iterative reconstruction technique.  CONTRAST:  80mL OMNIPAQUE IOHEXOL 300 MG/ML  SOLN  COMPARISON:  CT 06/02/2015  FINDINGS: Urinary Tract:  Bladder is unremarkable  Bowel:  No inflammatory bowel process is evident.  Vascular/Lymphatic: Advanced aortic atherosclerosis. No aneurysm. No suspicious lymph nodes.  Reproductive:  Hysterectomy.  No adnexal mass  Other:  Negative for pelvic effusion.  No free air  Musculoskeletal: No fracture or malalignment. Partially visualized lumbar spinal fusion hardware at L4-L5. Within the deep subcutaneous soft tissues of the lateral left hip, there is a hyperdense mass, this measures approximately 7.6 by 3.6 by 13.9 cm and is consistent with a soft tissue hematoma. There is surrounding soft tissue stranding and edema. Possible small focus of extravasation at the inferior margin of the hematoma, series 3, image 45 and delayed series 2 image 44.  IMPRESSION: 1. No CT evidence for acute osseous abnormality. 2. 7.6 x 3.6 x 13.9 cm hyperdense mass within the deep subcutaneous soft tissues of the lateral left hip with surrounding soft tissue stranding and edema consistent with hematoma. Possible small focus of extravasation/bleeding at the inferior margin of the hematoma. 3. Aortic atherosclerosis.  Aortic Atherosclerosis (ICD10-I70.0).  Critical Value/emergent results were called by telephone at the time of interpretation on 12/19/2022 at 3:39 pm to provider Dr. Larinda Buttery, Who verbally acknowledged these results.  Electronically Signed   By: Jasmine Pang M.D.   On: 12/19/2022 15:40 DG Hip Unilat With Pelvis 2-3 Views Left CLINICAL DATA:  Trauma, fall  EXAM: DG HIP (WITH OR  WITHOUT PELVIS) 2-3V LEFT  COMPARISON:  None Available.  FINDINGS: No recent fracture or dislocation is seen. Small bony spurs are noted in the lateral aspects of both hips. Scattered arterial calcifications are seen in the soft tissues. There is surgical fusion at the L4-L5 level in lumbar spine.  IMPRESSION: No recent fracture or dislocation is seen pelvis and left hip.  Electronically Signed  By: Ernie Avena M.D.   On: 12/19/2022 13:23 CT Head Wo Contrast CLINICAL DATA:  Head trauma, status post fall  EXAM: CT HEAD WITHOUT CONTRAST  CT CERVICAL SPINE WITHOUT CONTRAST  TECHNIQUE: Multidetector CT imaging of the head and cervical spine was performed following the standard protocol without intravenous contrast. Multiplanar CT image reconstructions of the cervical spine were also generated.  RADIATION DOSE REDUCTION: This exam was performed according to the departmental dose-optimization program which includes automated exposure control, adjustment of the mA and/or kV according to patient size and/or use of iterative reconstruction technique.  COMPARISON:  None Available.  FINDINGS: CT HEAD FINDINGS  Brain: No evidence of acute infarction, hemorrhage, extra-axial collection, ventriculomegaly, or mass effect. Left upper lobe encephalomalacia from prior insult. Left subinsular infarct. Generalized cerebral atrophy. Periventricular white matter low attenuation likely secondary to microangiopathy.  Vascular: Cerebrovascular atherosclerotic calcifications are noted. No hyperdense vessels.  Skull: Negative for fracture or focal lesion.  Sinuses/Orbits: Visualized portions of the orbits are unremarkable. Visualized portions of the paranasal sinuses are unremarkable. Visualized portions of the mastoid air cells are unremarkable.  Other: None.  CT CERVICAL SPINE FINDINGS  Alignment: 2 mm anterolisthesis of C3 on C4 secondary to facet disease.  Skull base  and vertebrae: No acute fracture. No primary bone lesion or focal pathologic process. Diffuse osteosclerosis throughout the visualized cervical spine as can be seen with nephropathy.  Soft tissues and spinal canal: No prevertebral fluid or swelling. No visible canal hematoma.  Disc levels: At C4-5 there is a mild broad-based disc bulge C4-5. Severe right facet arthropathy at C2-3, C3-4, C4-5, C5-6 and C7-T1. Moderate right facet arthropathy at C6-7.  Upper chest: Lung apices are clear.  Other: No fluid collection or hematoma. Bilateral carotid artery atherosclerosis.  IMPRESSION: 1. No acute intracranial pathology. 2. No acute osseous injury of the cervical spine. 3. Diffuse osteosclerosis throughout the visualized cervical spine as can be seen with nephropathy. Correlate with laboratory values.  Electronically Signed   By: Elige Ko M.D.   On: 12/19/2022 13:11 CT Cervical Spine Wo Contrast CLINICAL DATA:  Head trauma, status post fall  EXAM: CT HEAD WITHOUT CONTRAST  CT CERVICAL SPINE WITHOUT CONTRAST  TECHNIQUE: Multidetector CT imaging of the head and cervical spine was performed following the standard protocol without intravenous contrast. Multiplanar CT image reconstructions of the cervical spine were also generated.  RADIATION DOSE REDUCTION: This exam was performed according to the departmental dose-optimization program which includes automated exposure control, adjustment of the mA and/or kV according to patient size and/or use of iterative reconstruction technique.  COMPARISON:  None Available.  FINDINGS: CT HEAD FINDINGS  Brain: No evidence of acute infarction, hemorrhage, extra-axial collection, ventriculomegaly, or mass effect. Left upper lobe encephalomalacia from prior insult. Left subinsular infarct. Generalized cerebral atrophy. Periventricular white matter low attenuation likely secondary to microangiopathy.  Vascular: Cerebrovascular  atherosclerotic calcifications are noted. No hyperdense vessels.  Skull: Negative for fracture or focal lesion.  Sinuses/Orbits: Visualized portions of the orbits are unremarkable. Visualized portions of the paranasal sinuses are unremarkable. Visualized portions of the mastoid air cells are unremarkable.  Other: None.  CT CERVICAL SPINE FINDINGS  Alignment: 2 mm anterolisthesis of C3 on C4 secondary to facet disease.  Skull base and vertebrae: No acute fracture. No primary bone lesion or focal pathologic process. Diffuse osteosclerosis throughout the visualized cervical spine as can be seen with nephropathy.  Soft tissues and spinal canal: No prevertebral fluid or swelling. No visible canal hematoma.  Disc levels: At C4-5 there is a mild broad-based disc bulge C4-5. Severe right facet arthropathy at C2-3, C3-4, C4-5, C5-6 and C7-T1. Moderate right facet arthropathy at C6-7.  Upper chest: Lung apices are clear.  Other: No fluid collection or hematoma. Bilateral carotid artery atherosclerosis.  IMPRESSION: 1. No acute intracranial pathology. 2. No acute osseous injury of the cervical spine. 3. Diffuse osteosclerosis throughout the visualized cervical spine as can be seen with nephropathy. Correlate with laboratory values.  Electronically Signed   By: Elige Ko M.D.   On: 12/19/2022 13:11  Lab Results  Component Value Date   WBC 9.6 12/19/2022   HGB 11.4 (L) 12/19/2022   HCT 34.3 (L) 12/19/2022   MCV 91.7 12/19/2022   PLT 241 12/19/2022   Last metabolic panel Lab Results  Component Value Date   GLUCOSE 115 (H) 12/19/2022   NA 138 12/19/2022   K 3.1 (L) 12/19/2022   CL 107 12/19/2022   CO2 19 (L) 12/19/2022   BUN 26 (H) 12/19/2022   CREATININE 1.14 (H) 12/19/2022   GFRNONAA 47 (L) 12/19/2022   CALCIUM 8.2 (L) 12/19/2022   PROT 7.5 11/16/2021   ALBUMIN 3.9 11/16/2021   BILITOT 1.0 11/16/2021   ALKPHOS 56 11/16/2021   AST 30 11/16/2021   ALT 20  11/16/2021   ANIONGAP 12 12/19/2022    Assessment and Plan: * Fall Positive mechanical fall at home in the setting of confounding risk factors including CVA with right-sided weakness Noted significant left hip contusion with hematoma No overt fracture noted though with significant pain and difficulty with ambulation Plan for PT OT evaluation Fall precautions Follow  Hip hematoma, left Noted left hip hematoma status post fall On antiplatelet regimen including aspirin and Brilinta CT of the left hip pending to better assess Hold antiplatelet regimen for now Follow  Hypokalemia K3.1 Replete Check mag level  Hypercholesteremia Continue statin pending CK level status post fall  Gastroesophageal reflux disease without esophagitis PPI  Controlled type 2 diabetes mellitus with stage 3 chronic kidney disease, without long-term current use of insulin (HCC) SSI  A1C    CAD (coronary artery disease) No active CP  Hold antiplatelet medication for now pending CT to assess hematoma   Benign hypertension with CKD (chronic kidney disease) stage III (HCC) Creatinine 1.14 today Appears at baseline Monitor  History of Stroke Baseline hx/o 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 Inpatient status is judged to be reasonable and necessary in order to provide the required intensity of service to ensure the patient's safety. The patient's presenting symptoms, physical exam findings, and initial radiographic and laboratory data in the context of their chronic comorbidities is felt to place them at high risk for further clinical deterioration.  Furthermore, it is not anticipated that the patient will be medically stable for discharge from the hospital within 2 midnights of admission.   * I certify that at the point of admission it is my clinical judgment that the patient will require inpatient hospital care spanning beyond 2 midnights from the point of admission due to high intensity of service, high risk for further  deterioration and high frequency of surveillance required.*  Author: Floydene Flock, MD 12/19/2022 3:43 PM  For on call review [email or web address].

## 2022-12-19 NOTE — Assessment & Plan Note (Signed)
SSI  A1C  

## 2022-12-19 NOTE — ED Notes (Signed)
PA asked staff to ambulate pt with a walker. Staff went into patient room telling the patient & her sister that "she has to try an ambulate maybe to the bathroom, because pt said "she has to urinate but she is not able to urinate using the purewick." Staff started to put on her clothes and shoes. Staff instructed patient to slowly swing her legs over to the side of the bed so we can stand. Patient ask can staff lower the bed any lower, the bed was already at the lowest position. Sister was on the left side, as staff was on the right side trying to sit patient up on the side of the bed. Patient could not sit herself up, patient barley could stand long enough for staff and sister to pull her pants up. Patient started to slide WITH SHOES ON. Staff put her foot in front of patient foot too keep from sliding. Patient stated " I feel like I'm about to pass out" I asked the sister to step aside so staff can put patient back onto the bed. Staff did manage to get patient back on the bed. Patient stated "I'm dizzy". Staff put the walker back near the wall, left patient as IS IN THE BED. SISTER STORMED OUT MAD SAYING "SHE DIDN'Y LIKE STAFF ATTITUDE". However, staff did not have an attitude. Staff told RN " I will not be going back into patient room. Staff told PA what happened. PA went inside patient room to talk to sister.   As staff was walking with another RN, helping with another patient. Patient SISTER, WALKED PASSED VERBALLY ASKING STAFF FOR HER NAME, STAFF KEPT WALKING. RN WITNESSED THE INCIDENT .

## 2022-12-19 NOTE — Assessment & Plan Note (Signed)
Noted left hip hematoma status post fall On antiplatelet regimen including aspirin and Brilinta CT of the left hip pending to better assess Hold antiplatelet regimen for now Follow

## 2022-12-20 DIAGNOSIS — W19XXXA Unspecified fall, initial encounter: Secondary | ICD-10-CM | POA: Diagnosis not present

## 2022-12-20 DIAGNOSIS — E876 Hypokalemia: Secondary | ICD-10-CM | POA: Diagnosis not present

## 2022-12-20 DIAGNOSIS — K219 Gastro-esophageal reflux disease without esophagitis: Secondary | ICD-10-CM | POA: Diagnosis not present

## 2022-12-20 DIAGNOSIS — R350 Frequency of micturition: Secondary | ICD-10-CM | POA: Diagnosis present

## 2022-12-20 DIAGNOSIS — S7002XA Contusion of left hip, initial encounter: Secondary | ICD-10-CM | POA: Diagnosis not present

## 2022-12-20 DIAGNOSIS — I63512 Cerebral infarction due to unspecified occlusion or stenosis of left middle cerebral artery: Secondary | ICD-10-CM

## 2022-12-20 LAB — COMPREHENSIVE METABOLIC PANEL
ALT: 15 U/L (ref 0–44)
AST: 17 U/L (ref 15–41)
Albumin: 3.3 g/dL — ABNORMAL LOW (ref 3.5–5.0)
Alkaline Phosphatase: 49 U/L (ref 38–126)
Anion gap: 7 (ref 5–15)
BUN: 29 mg/dL — ABNORMAL HIGH (ref 8–23)
CO2: 23 mmol/L (ref 22–32)
Calcium: 7.8 mg/dL — ABNORMAL LOW (ref 8.9–10.3)
Chloride: 109 mmol/L (ref 98–111)
Creatinine, Ser: 1.28 mg/dL — ABNORMAL HIGH (ref 0.44–1.00)
GFR, Estimated: 41 mL/min — ABNORMAL LOW (ref >60)
Glucose, Bld: 111 mg/dL — ABNORMAL HIGH (ref 70–99)
Potassium: 3.2 mmol/L — ABNORMAL LOW (ref 3.5–5.1)
Sodium: 139 mmol/L (ref 135–145)
Total Bilirubin: 0.8 mg/dL (ref 0.3–1.2)
Total Protein: 6.1 g/dL — ABNORMAL LOW (ref 6.5–8.1)

## 2022-12-20 LAB — CBC
HCT: 30.9 % — ABNORMAL LOW (ref 36.0–46.0)
Hemoglobin: 9.7 g/dL — ABNORMAL LOW (ref 12.0–15.0)
MCH: 30.2 pg (ref 26.0–34.0)
MCHC: 31.4 g/dL (ref 30.0–36.0)
MCV: 96.3 fL (ref 80.0–100.0)
Platelets: 197 10*3/uL (ref 150–400)
RBC: 3.21 MIL/uL — ABNORMAL LOW (ref 3.87–5.11)
RDW: 13.6 % (ref 11.5–15.5)
WBC: 11 10*3/uL — ABNORMAL HIGH (ref 4.0–10.5)
nRBC: 0 % (ref 0.0–0.2)

## 2022-12-20 MED ORDER — ATORVASTATIN CALCIUM 20 MG PO TABS
80.0000 mg | ORAL_TABLET | Freq: Every day | ORAL | Status: DC
  Administered 2022-12-20 – 2022-12-21 (×2): 80 mg via ORAL
  Filled 2022-12-20 (×2): qty 4

## 2022-12-20 MED ORDER — SODIUM CHLORIDE 0.9 % IV SOLN
1.0000 g | INTRAVENOUS | Status: DC
  Administered 2022-12-20: 1 g via INTRAVENOUS
  Filled 2022-12-20 (×2): qty 10

## 2022-12-20 MED ORDER — ACETAMINOPHEN 325 MG PO TABS
650.0000 mg | ORAL_TABLET | Freq: Four times a day (QID) | ORAL | Status: DC | PRN
  Administered 2022-12-20 – 2022-12-21 (×2): 650 mg via ORAL
  Filled 2022-12-20 (×2): qty 2

## 2022-12-20 MED ORDER — MAGNESIUM SULFATE 2 GM/50ML IV SOLN
2.0000 g | Freq: Once | INTRAVENOUS | Status: AC
  Administered 2022-12-20: 2 g via INTRAVENOUS
  Filled 2022-12-20: qty 50

## 2022-12-20 MED ORDER — POTASSIUM CHLORIDE CRYS ER 20 MEQ PO TBCR: 40.0000 meq | EXTENDED_RELEASE_TABLET | Freq: Once | ORAL | Status: DC

## 2022-12-20 NOTE — Assessment & Plan Note (Signed)
Creatinine 1.14 on admission and trending up.  Continue IV hydration and monitor kidney function  Lab Results  Component Value Date   CREATININE 1.28 (H) 12/20/2022   CREATININE 1.14 (H) 12/19/2022   CREATININE 1.16 (H) 11/25/2021

## 2022-12-20 NOTE — Hospital Course (Signed)
82 y.o. 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  Started on IV Rocephin empirically for possible UTI as daughter concerned about urinary frequency, PT and OT eval.  5/15: Vital stable.  Creatinine improved to baseline.  Hemoglobin decreased to 8.6, starting on an supplement.  PT/OT recommending home health but patient refused and would like to go for outpatient PT.  Patient was on aspirin and Brilinta at home.  She was taking Brilinta for more than a year now.  She was instructed to hold Brilinta and follow-up with her physicians to discuss the need of continuation of this drug.  She can resume her aspirin on discharge.  She is also being given Keflex for 5 days for concern of UTI.  She was instructed to follow-up with urology for increased urinary urgency if does not improve with antibiotics.  She will continue the rest of her home medications and need to have a close follow-up with her providers for further recommendations.

## 2022-12-20 NOTE — Evaluation (Signed)
Physical Therapy Evaluation Patient Details Name: Cassidy Hernandez MRN: [id number] DOB: 1941/01/23 Today's Date: 12/20/2022  History of Present Illness  Reyna Sharratt is an 89yoF  PMH: CVA c residual speech deficits, flat affect, unilateral foot drop, stage III CKD, CAD, dCHF, HLD, HTN. Pt comes to [facility] ED after fall, hit her head adn hip, imaging revealing of left hip hematoma.  At baseline pt lives at home alone, heavy support from multiple family members (sister, DTR, friend). Patient denies any weakness or dizziness prior to fall.  Clinical Impression  Pt in bed on arrival, awake, recognizes author from remote OPPT. Pt agreeable to evaluation. Pt reports having pain meds, improved pain control at rest and only up to 5/10 with standing/AMB. Pt requires minA to get to EOB due to acute weakness and pain, but is able to stand, balance, and AMB at near baseline quality, only limited in duration due to pain levels. Pt endorses moving well enough to safely return to home, understands that she will be limited by pain and likely tolerate mobility better with coordinated analgesia. Pt assisted back to bed, all needs met.      Recommendations for follow up therapy are one component of a multi-disciplinary discharge planning process, led by the attending physician.  Recommendations may be updated based on patient status, additional functional criteria and insurance authorization.  Follow Up Recommendations       Assistance Recommended at Discharge Intermittent Supervision/Assistance  Patient can return home with the following  A little help with walking and/or transfers;Assist for transportation;Assistance with cooking/housework    Equipment Recommendations None recommended by PT  Recommendations for Other Services       Functional Status Assessment Patient has had a recent decline in their functional status and demonstrates the ability to make significant improvements in function in a reasonable and  predictable amount of time.     Precautions / Restrictions Precautions Precautions: Fall Restrictions Weight Bearing Restrictions: No      Mobility  Bed Mobility Overal bed mobility: Needs Assistance Bed Mobility: Supine to Sit, Sit to Supine     Supine to sit: Min assist Sit to supine: Min assist   General bed mobility comments: a bit weaker than typical, minA of trunk    Transfers Overall transfer level: Needs assistance Equipment used: Rolling walker (2 wheels) Transfers: Sit to/from Stand Sit to Stand: Supervision           General transfer comment: assisted with shoes    Ambulation/Gait   Gait Distance (Feet): 100 Feet Assistive device: Rolling walker (2 wheels) Gait Pattern/deviations: Step-to pattern, Step-through pattern       General Gait Details: mild foot drop given absence of AFO, well compensated, pt assisted wth shoes prior to AMB. Able to AMB in a similar to baseline fashion, a little more limited in toelrance due to pain.  Stairs            Wheelchair Mobility    Modified Rankin (Stroke Patients Only)       Balance Overall balance assessment: Modified Independent, History of Falls                                           Pertinent Vitals/Pain Pain Assessment Pain Assessment: 0-10 Pain Score: 5  Pain Location: L hip while walking Pain Descriptors / Indicators: Aching, Sore Pain Intervention(s): Limited activity within patient's tolerance,  Monitored during session, Premedicated before session    Home Living Family/patient expects to be discharged to:: Private residence Living Arrangements: Alone Available Help at Discharge: Family;Available 24 hours/day;Personal care attendant (Sister comes 3x/wk & stays all day, PCA comes 2x/wk & stays a few hours, daugther also available as needed) Type of Home: House Home Access: Level entry       Home Layout: One level Home Equipment: Rollator (4 wheels);Grab bars -  tub/shower;Tub bench      Prior Function Prior Level of Function : History of Falls (last six months);Needs assist             Mobility Comments: Pt reports Mod I using rollator for household distances, 2-3 falls past 6 months ADLs Comments: Pt reports Mod I for ADLs, IND med mgmt, assist for IADLs, pt does not drive     Hand Dominance        Extremity/Trunk Assessment   Upper Extremity Assessment Upper Extremity Assessment: Generalized weakness;RUE deficits/detail RUE Deficits / Details: H/o CVA with R sided residual deficits. H/o R rotator cuff repair 06/2021, grossly 4/5, able to use functionally, limited R shoulder flexion/abduction    Lower Extremity Assessment Lower Extremity Assessment: Generalized weakness;RLE deficits/detail RLE Deficits / Details: H/o CVA with R sided residual deficits (foot drop, has AFOs)       Communication   Communication: Expressive difficulties (h/o dysarthria)  Cognition Arousal/Alertness: Awake/alert Behavior During Therapy: WFL for tasks assessed/performed Overall Cognitive Status: Within Functional Limits for tasks assessed                                          General Comments      Exercises     Assessment/Plan    PT Assessment Patient needs continued PT services  PT Problem List Decreased strength;Decreased activity tolerance;Decreased range of motion;Decreased balance;Decreased mobility;Decreased coordination;Obesity       PT Treatment Interventions Stair training;Gait training;Functional mobility training;Therapeutic activities;Therapeutic exercise;Balance training;Patient/family education    PT Goals (Current goals can be found in the Care Plan section)  Acute Rehab PT Goals Patient Stated Goal: return to home, avoid future falls PT Goal Formulation: With patient Time For Goal Achievement: 01/03/23 Potential to Achieve Goals: Fair    Frequency Min 4X/week     Co-evaluation                AM-PAC PT "6 Clicks" Mobility  Outcome Measure Help needed turning from your back to your side while in a flat bed without using bedrails?: A Lot Help needed moving from lying on your back to sitting on the side of a flat bed without using bedrails?: A Lot Help needed moving to and from a bed to a chair (including a wheelchair)?: A Little Help needed standing up from a chair using your arms (e.g., wheelchair or bedside chair)?: A Little Help needed to walk in hospital room?: A Little Help needed climbing 3-5 steps with a railing? : A Little 6 Click Score: 16    End of Session Equipment Utilized During Treatment: Gait belt Activity Tolerance: Patient tolerated treatment well;Patient limited by pain Patient left: in bed;with call bell/phone within reach Nurse Communication: Mobility status PT Visit Diagnosis: Difficulty in walking, not elsewhere classified (R26.2);Muscle weakness (generalized) (M62.81);Other abnormalities of gait and mobility (R26.89)    Time: 6045-4098 PT Time Calculation (min) (ACUTE ONLY): 29 min   Charges:  PT Evaluation $PT Eval Moderate Complexity: 1 Mod PT Treatments $Therapeutic Activity: 8-22 mins       2:19 PM, 12/20/22 Rosamaria Lints, PT, DPT Physical Therapist - [facility]  [phone] (ASCOM)    Richelle Glick C 12/20/2022, 2:17 PM

## 2022-12-20 NOTE — Assessment & Plan Note (Signed)
Positive mechanical fall at home in the setting of confounding risk factors including CVA with right-sided weakness Noted significant left hip contusion with hematoma No overt fracture noted though with significant pain and difficulty with ambulation PT OT evaluation Fall precautions Palliative care evaluation

## 2022-12-20 NOTE — Plan of Care (Signed)

## 2022-12-20 NOTE — Assessment & Plan Note (Signed)
Continue PPI ?

## 2022-12-20 NOTE — Assessment & Plan Note (Signed)
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  Palliative care consult for goals of care discussion 

## 2022-12-20 NOTE — Progress Notes (Signed)
IR Procedure Request - Left Hip Hematoma  82 y.o. female inpatient. History of CHF, HTN, HLD, CV with residual right sides weakness and right sided foot drop. Presented to the ED at [facility] on 5.13.23 with pain to left hip after fall. CT pelvis from 5.13.24 reads  7.6 x 3.6 x 13.9 cm hyperdense mass within the deep subcutaneou soft tissues of the lateral left hip with surrounding soft tissue stranding and edema consistent with hematoma. Possible small focus of extravasation/bleeding at the inferior margin of the hematoma.  Patient is on 81 mg of ASA and Brilinta Hgb 9.7 < 9.9 < 11.4. Team is requesting evaluation for left hip hematoma. Case reviewed by IR Attending Dr. Sarita Haver. After review of the images the hematoma in question appears to be peripheral. As such there is no benefit/indication for embolization. Recommended Patient be medically managed at this time. This was communicated to the Team via EPIC chat.

## 2022-12-20 NOTE — Assessment & Plan Note (Signed)
Continue statin. 

## 2022-12-20 NOTE — Assessment & Plan Note (Signed)
May be suggestive of UTI.  UA also looks worrisome. we will treat her empirically with Rocephin for now

## 2022-12-20 NOTE — Progress Notes (Addendum)
Progress Note   Patient: Cassidy Hernandez NWG:[id number] DOB: 1940-09-03 DOA: 12/19/2022     1 DOS: the patient was seen and examined on 12/20/2022   Brief hospital course: 82 y.o. 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  Started on IV Rocephin empirically for possible UTI as daughter concerned about urinary frequency, PT and OT eval  Assessment and Plan: * Fall Positive mechanical fall at home in the setting of confounding risk factors including CVA with right-sided weakness Noted significant left hip contusion with hematoma No overt fracture noted though with significant pain and difficulty with ambulation PT OT evaluation Fall precautions Palliative care evaluation  Hip hematoma, left Noted left hip hematoma status post fall On antiplatelet regimen including aspirin and Brilinta CT of the left hip confirming hematoma.  No fractures H&H stable for now Hold antiplatelet regimen for now PT and OT eval  Urinary frequency May be suggestive of UTI.  UA also looks worrisome. we will treat her empirically with Rocephin for now  Hypokalemia Replete and recheck Also has low magnesium.  Will replace magnesium  Hypercholesteremia Continue statin  Gastroesophageal reflux disease without esophagitis Continue PPI  Controlled type 2 diabetes mellitus with stage 3 chronic kidney disease, without long-term current use of insulin (HCC) Continue SSI     CAD (coronary artery disease) No active CP  Hold antiplatelet medication for now considering large hematoma   Benign hypertension with CKD (chronic kidney disease) stage III (HCC) Creatinine 1.14 on admission and trending up.  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  Palliative care consult for goals of care discussion        Subjective: Patient feeling tired, having pain on her site of hematoma  Physical Exam: Vitals:   12/20/22 0945 12/20/22 1100 12/20/22 1500 12/20/22 1738  BP: (!) 156/61  (!) 113/50 (!) 113/33  Pulse: 78  89 96  Resp: 14  15 20   Temp:  97.8 F (36.6 C) 97.6 F (36.4 C) 98 F (36.7 C)  TempSrc:   Oral   SpO2: 94%  96% 100%  Weight:      Height:       82 year old female lying in the bed comfortably without any acute distress Lungs clear to auscultation bilaterally Heart regular rate and rhythm Abdomen soft, benign Neuro alert and awake, positive for dysarthria and right-sided weakness from previous stroke Psych normal mood and affect Skin/muscular: She has a large hematoma to the left hip without any open wounds.  She does have tenderness to the site on touch Data Reviewed:  Potassium 3.2, magnesium 1.4, hemoglobin 9.7  Family Communication: None at bedside  Disposition: Status is: Observation The patient remains OBS appropriate and will d/c before 2 midnights.  Planned Discharge Destination: Home with Home Health   DVT prophylaxis-SCDs Time spent: 35 minutes  Author: Delfino Lovett, MD 12/20/2022 6:08 PM  For on call review [email or web address].

## 2022-12-20 NOTE — Assessment & Plan Note (Signed)
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  Palliative care consult for goals of care discussion

## 2022-12-20 NOTE — Assessment & Plan Note (Signed)
No active CP  Hold antiplatelet medication for now considering large hematoma

## 2022-12-20 NOTE — TOC Progression Note (Signed)
Transition of Care [facility]) - Progression Note    Patient Details  Name: Cassidy Hernandez MRN: [id number] Date of Birth: 09-03-1940  Transition of Care [facility]) CM/SW Contact  Marlowe Sax, RN Phone Number: 12/20/2022, 3:46 PM  Clinical Narrative:   Spoke with the patient and we called her daughter while in the room, she currently lives alone however her sister comes every day and stays with her and she has someone also coming to take her to outpatient PT, she wants to continue outpatient pt and does not want HH pt Her daughter stated that she has been having urine frequency especially at night and would like to make sure the Doctor is aware, I sent a secure note notifying him She stated that she has a rolling walker at home and does not feel she needs additional DME Code 44 was provided and explained TOC will continue to follow for additonal needs    Expected Discharge Plan: OP Rehab Barriers to Discharge: No Barriers Identified  Expected Discharge Plan and Services   Discharge Planning Services: CM Consult   Living arrangements for the past 2 months: Single Family Home                 DME Arranged: N/A         HH Arranged: Refused HH (wants to continue Outpatient PT) HH Agency: NA         Social Determinants of Health (SDOH) Interventions SDOH Screenings   Food Insecurity: No Food Insecurity (12/20/2022)  Housing: Low Risk  (12/20/2022)  Transportation Needs: No Transportation Needs (12/20/2022)  Utilities: Not At Risk (12/20/2022)  Depression (PHQ2-9): Low Risk  (10/17/2019)  Tobacco Use: Low Risk  (12/19/2022)    Readmission Risk Interventions     No data to display

## 2022-12-20 NOTE — Evaluation (Addendum)
Occupational Therapy Evaluation Patient Details Name: Cassidy Hernandez MRN: [id number] DOB: Jul 26, 1941 Today's Date: 12/20/2022   History of Present Illness Cassidy Hernandez is a 82 y.o. 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  Patient current lives at home.  History also from her sister.  Per report, patient tripped falling backwards.  No reported head trauma loss consciousness.  Sister states that she saw her falling but was unable to catch her.  Patient denies any weakness or dizziness prior to fall.  Baseline history of CVA within the past year seen by [facility] neurology.   Clinical Impression   Patient agreeable to OT evaluation. Pt presenting with decreased independence in self care, balance, functional mobility/transfers, and endurance. PTA pt lived alone, was Mod I for ADLs, received assistance for IADLs, and was Mod I for functional mobility using a rollator. Pt has great support at home including a sister who comes to stay with pt all day 3x/wk and a PCA that comes 2x/wk for several hours. Pt currently functioning at Mod A for supine to sit, CGA for sit to supine, Min A for STS from EOB via HHA, and set up-supervision for seated grooming tasks. Pt endorsed feeling unsteady upon standing (further mobility deferred due to safety and not having access to RW in ED). Pt endorsed 5/10 L hip pain (RN notified). Pt will benefit from skilled acute OT services to address deficits noted below. OT recommends ongoing therapy upon discharge to maximize safety and independence with ADLs, decrease fall risk, decrease caregiver burden, and promote return to PLOF.     Recommendations for follow up therapy are one component of a multi-disciplinary discharge planning process, led by the attending physician.  Recommendations may be updated based on patient  status, additional functional criteria and insurance authorization.   Assistance Recommended at Discharge Frequent or constant Supervision/Assistance  Patient can return home with the following Assistance with cooking/housework;Assist for transportation;Help with stairs or ramp for entrance;A little help with walking and/or transfers;A little help with bathing/dressing/bathroom    Functional Status Assessment  Patient has had a recent decline in their functional status and demonstrates the ability to make significant improvements in function in a reasonable and predictable amount of time.  Equipment Recommendations  None recommended by OT    Recommendations for Other Services       Precautions / Restrictions Precautions Precautions: Fall Restrictions Weight Bearing Restrictions: No      Mobility Bed Mobility Overal bed mobility: Needs Assistance Bed Mobility: Supine to Sit, Sit to Supine     Supine to sit: Mod assist Sit to supine: Min guard   General bed mobility comments: Min A to scoot hips forward at EOB. Pt able to assist with scooting self up toward [facility] utilizing bed features/hand rails    Transfers Overall transfer level: Needs assistance Equipment used: 1 person hand held assist Transfers: Sit to/from Stand Sit to Stand: Min assist           General transfer comment: STS from EOB      Balance Overall balance assessment: Needs assistance Sitting-balance support: Feet supported, Bilateral upper extremity supported Sitting balance-Leahy Scale: Good     Standing balance support: Single extremity supported Standing balance-Leahy Scale: Poor       ADL either performed or assessed with clinical judgement   ADL Overall ADL's : Needs assistance/impaired  Grooming: Set up;Supervision/safety;Sitting;Wash/dry face                   Toilet Transfer: Minimal assistance Toilet Transfer Details (indicate cue type and reason): simulated with STS from  EOB                 Vision Baseline Vision/History: 1 Wears glasses Patient Visual Report: No change from baseline       Perception     Praxis      Pertinent Vitals/Pain Pain Assessment Pain Assessment: 0-10 Pain Score: 5  Pain Location: L hip Pain Descriptors / Indicators: Aching, Sore Pain Intervention(s): Limited activity within patient's tolerance, Monitored during session, Repositioned, Patient requesting pain meds-RN notified     Hand Dominance     Extremity/Trunk Assessment Upper Extremity Assessment Upper Extremity Assessment: Generalized weakness;RUE deficits/detail RUE Deficits / Details: H/o CVA with R sided residual deficits. H/o R rotator cuff repair 06/2021, grossly 4/5, able to use functionally, limited R shoulder flexion/abduction   Lower Extremity Assessment Lower Extremity Assessment: Generalized weakness;RLE deficits/detail RLE Deficits / Details: H/o CVA with R sided residual deficits (foot drop, has AFOs)       Communication Communication Communication: Expressive difficulties (h/o dysarthria)   Cognition Arousal/Alertness: Awake/alert Behavior During Therapy: WFL for tasks assessed/performed Overall Cognitive Status: Within Functional Limits for tasks assessed         General Comments       Exercises Other Exercises Other Exercises: OT provided education re: role of OT, OT POC, post acute recs, sitting up for all meals, EOB/OOB mobility with assistance, home/fall safety.     Shoulder Instructions      Home Living Family/patient expects to be discharged to:: Private residence Living Arrangements: Alone Available Help at Discharge: Family;Available 24 hours/day;Personal care attendant (Sister comes 3x/wk & stays all day, PCA comes 2x/wk & stays a few hours, daugther also available as needed) Type of Home: House Home Access: Level entry     Home Layout: One level     Bathroom Shower/Tub: Chief Strategy Officer:  Handicapped height     Home Equipment: Rollator (4 wheels);Grab bars - tub/shower;Tub bench          Prior Functioning/Environment Prior Level of Function : History of Falls (last six months);Needs assist             Mobility Comments: Pt reports Mod I using rollator for household distances, 2-3 falls past 6 months ADLs Comments: Pt reports Mod I for ADLs, IND med mgmt, assist for IADLs, pt does not drive        OT Problem List: Decreased strength; Decreased range of motion; Decreased activity tolerance; Impaired balance (sitting and/or standing); Pain; Decreased coordination; Obesity      OT Treatment/Interventions: Self-care/ADL training;Therapeutic exercise;Energy conservation;DME and/or AE instruction;Therapeutic activities;Patient/family education;Balance training    OT Goals(Current goals can be found in the care plan section) Acute Rehab OT Goals Patient Stated Goal: return home OT Goal Formulation: With patient Time For Goal Achievement: 01/03/23 Potential to Achieve Goals: Good   OT Frequency: Min 1X/week    Co-evaluation              AM-PAC OT "6 Clicks" Daily Activity     Outcome Measure Help from another person eating meals?: A Little Help from another person taking care of personal grooming?: A Little Help from another person toileting, which includes using toliet, bedpan, or urinal?: A Lot Help from another person bathing (including washing, rinsing,  drying)?: A Lot Help from another person to put on and taking off regular upper body clothing?: A Little Help from another person to put on and taking off regular lower body clothing?: A Lot 6 Click Score: 15   End of Session Equipment Utilized During Treatment: Gait belt Nurse Communication: Mobility status;Patient requests pain meds  Activity Tolerance: Patient tolerated treatment well;Patient limited by pain Patient left: in bed;with call bell/phone within reach;with bed alarm set  OT Visit  Diagnosis: Unsteadiness on feet (R26.81);Muscle weakness (generalized) (M62.81);History of falling (Z91.81);Pain Pain - Right/Left: Left Pain - part of body: Hip                Time: 0981-1914 OT Time Calculation (min): 31 min Charges:  OT General Charges $OT Visit: 1 Visit OT Evaluation $OT Eval Moderate Complexity: 1 Mod  [facility] MS, OTR/L ascom [phone]  12/20/22, 12:44 PM

## 2022-12-20 NOTE — Care Management CC44 (Signed)
Condition Code 44 Documentation Completed  Patient Details  Name: Cassidy Hernandez MRN: [id number] Date of Birth: 12/23/1940   Condition Code 44 given:  Yes Patient signature on Condition Code 44 notice:  Yes Documentation of 2 MD's agreement:  Yes Code 44 added to claim:  Yes    Marlowe Sax, RN 12/20/2022, 3:23 PM

## 2022-12-20 NOTE — Assessment & Plan Note (Signed)
Continue SSI. °

## 2022-12-20 NOTE — Care Management Obs Status (Signed)
MEDICARE OBSERVATION STATUS NOTIFICATION   Patient Details  Name: DELIZA BILLINGS MRN: [id number] Date of Birth: [birth year]   Medicare Observation Status Notification Given:   yes    Marlowe Sax, RN 12/20/2022, 3:22 PM

## 2022-12-20 NOTE — Assessment & Plan Note (Addendum)
Replete and recheck Also has low magnesium.  Will replace magnesium

## 2022-12-20 NOTE — ED Notes (Signed)
This NT changed pt's brief after she had a bowel movement and repositioned pure wick.

## 2022-12-21 DIAGNOSIS — W19XXXA Unspecified fall, initial encounter: Secondary | ICD-10-CM | POA: Diagnosis not present

## 2022-12-21 DIAGNOSIS — T148XXA Other injury of unspecified body region, initial encounter: Secondary | ICD-10-CM | POA: Diagnosis not present

## 2022-12-21 DIAGNOSIS — K219 Gastro-esophageal reflux disease without esophagitis: Secondary | ICD-10-CM

## 2022-12-21 DIAGNOSIS — I129 Hypertensive chronic kidney disease with stage 1 through stage 4 chronic kidney disease, or unspecified chronic kidney disease: Secondary | ICD-10-CM

## 2022-12-21 DIAGNOSIS — R350 Frequency of micturition: Secondary | ICD-10-CM

## 2022-12-21 LAB — CBC
HCT: 26.4 % — ABNORMAL LOW (ref 36.0–46.0)
Hemoglobin: 8.6 g/dL — ABNORMAL LOW (ref 12.0–15.0)
MCH: 30.3 pg (ref 26.0–34.0)
MCHC: 32.6 g/dL (ref 30.0–36.0)
MCV: 93 fL (ref 80.0–100.0)
Platelets: 210 10*3/uL (ref 150–400)
RBC: 2.84 MIL/uL — ABNORMAL LOW (ref 3.87–5.11)
RDW: 13.9 % (ref 11.5–15.5)
WBC: 9.5 10*3/uL (ref 4.0–10.5)
nRBC: 0 % (ref 0.0–0.2)

## 2022-12-21 LAB — BASIC METABOLIC PANEL
Anion gap: 6 (ref 5–15)
BUN: 26 mg/dL — ABNORMAL HIGH (ref 8–23)
CO2: 23 mmol/L (ref 22–32)
Calcium: 7.8 mg/dL — ABNORMAL LOW (ref 8.9–10.3)
Chloride: 110 mmol/L (ref 98–111)
Creatinine, Ser: 1.15 mg/dL — ABNORMAL HIGH (ref 0.44–1.00)
GFR, Estimated: 47 mL/min — ABNORMAL LOW (ref >60)
Glucose, Bld: 114 mg/dL — ABNORMAL HIGH (ref 70–99)
Potassium: 3.8 mmol/L (ref 3.5–5.1)
Sodium: 139 mmol/L (ref 135–145)

## 2022-12-21 MED ORDER — FE FUM-VIT C-VIT B12-FA 460-60-0.01-1 MG PO CAPS
1.0000 | ORAL_CAPSULE | Freq: Every day | ORAL | Status: DC
  Administered 2022-12-21: 1 via ORAL
  Filled 2022-12-21: qty 1

## 2022-12-21 MED ORDER — CEPHALEXIN 500 MG PO CAPS: 500.0000 mg | ORAL_CAPSULE | Freq: Three times a day (TID) | ORAL | 0 refills | Status: AC

## 2022-12-21 MED ORDER — TICAGRELOR 90 MG PO TABS: 90.0000 mg | ORAL_TABLET | Freq: Two times a day (BID) | ORAL | 1 refills | Status: DC

## 2022-12-21 MED ORDER — FE FUM-VIT C-VIT B12-FA 460-60-0.01-1 MG PO CAPS: 1.0000 | ORAL_CAPSULE | Freq: Every day | ORAL | 0 refills | Status: DC

## 2022-12-21 NOTE — Discharge Summary (Signed)
Physician Discharge Summary   Patient: Cassidy Hernandez MRN: [id number] DOB: 1941-02-11  Admit date:     12/19/2022  Discharge date: 12/21/22  Discharge Physician: Arnetha Courser   PCP: Lauro Regulus, MD   Recommendations at discharge:  Please obtain CBC and BMP in 1 week Please reconsider the continuation of Brilinta as she was taking it for more than 1 year. Please ensure the completion of the course of Keflex for concern of UTI, if her urinary urgency symptoms persist she will need to follow-up with urologist for further recommendations. Follow-up with primary care provider within a week  Discharge Diagnoses: Principal Problem:   Fall Active Problems:   Hip hematoma, left   History of stroke   Benign hypertension with CKD (chronic kidney disease) stage III (HCC)   CAD (coronary artery disease)   Controlled type 2 diabetes mellitus with stage 3 chronic kidney disease, without long-term current use of insulin (HCC)   Gastroesophageal reflux disease without esophagitis   Hypercholesteremia   Hypokalemia   Urinary frequency   Hematoma   Hospital Course: 82 y.o. 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  Started on IV Rocephin empirically for possible UTI as daughter concerned about urinary frequency, PT and OT eval.  5/15: Vital stable.  Creatinine improved to baseline.  Hemoglobin decreased to 8.6, starting on an supplement.  PT/OT recommending home health but patient refused and would like to go for outpatient PT.  Patient was on aspirin and Brilinta at home.  She was taking Brilinta for more than a year now.  She was instructed to hold Brilinta and follow-up with her physicians to discuss the need of continuation of this drug.  She  can resume her aspirin on discharge.  She is also being given Keflex for 5 days for concern of UTI.  She was instructed to follow-up with urology for increased urinary urgency if does not improve with antibiotics.  She will continue the rest of her home medications and need to have a close follow-up with her providers for further recommendations.  Assessment and Plan: * Fall Positive mechanical fall at home in the setting of confounding risk factors including CVA with right-sided weakness Noted significant left hip contusion with hematoma No overt fracture noted though with significant pain and difficulty with ambulation PT OT evaluation Fall precautions Palliative care evaluation  Hip hematoma, left Noted left hip hematoma status post fall On antiplatelet regimen including aspirin and Brilinta CT of the left hip confirming hematoma.  No fractures H&H stable for now Hold antiplatelet regimen for now PT and OT eval  Urinary frequency May be suggestive of UTI.  UA also looks worrisome. we will treat her empirically with Rocephin for now  Hypokalemia Replete and recheck Also has low magnesium.  Will replace magnesium  Hypercholesteremia Continue statin  Gastroesophageal reflux disease without esophagitis Continue PPI  Controlled type 2 diabetes mellitus with stage 3 chronic kidney disease, without long-term current use of insulin (HCC) Continue SSI     CAD (coronary artery disease) No active CP  Hold antiplatelet medication for now considering large hematoma   Benign hypertension with CKD (chronic kidney disease) stage III (HCC) Creatinine 1.14 on admission and trending up.  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  Palliative care consult for goals of care discussion   Consultants: Interventional radiology Procedures performed: None Disposition: Home Diet recommendation:  Discharge Diet Orders (From admission, onward)     Start     Ordered   12/21/22 0000  Diet - low sodium heart healthy        12/21/22 1026           Cardiac diet DISCHARGE MEDICATION: Allergies as of 12/21/2022       Reactions   Sulfa Antibiotics Other (See Comments)        Medication List     STOP taking these medications    omeprazole 20 MG capsule Commonly known as: PRILOSEC   rosuvastatin 20 MG tablet Commonly known as: CRESTOR       TAKE these medications    acetaminophen 325 MG tablet Commonly known as: TYLENOL Take 650 mg by mouth every 8 (eight) hours as needed for moderate pain or mild pain (Back).   aspirin 81 MG chewable tablet Chew 1 tablet (81 mg total) by mouth daily.   atorvastatin 80 MG tablet Commonly known as: LIPITOR Take 80 mg by mouth daily.   carvedilol 3.125 MG tablet Commonly known as: COREG Take 3.125 mg by mouth 2 (two) times daily.   cephALEXin 500 MG capsule Commonly known as: KEFLEX Take 1 capsule (500 mg total) by mouth 3 (three) times daily for 5 days.   chlorthalidone 25 MG tablet Commonly known as: HYGROTON Take 25 mg by mouth daily.   docusate sodium 100 MG capsule Commonly known as: COLACE Take 1 capsule (100 mg total) by mouth 2 (two) times daily. What changed:  when to take this reasons to take this   escitalopram 20 MG tablet Commonly known as: LEXAPRO Take 20 mg by mouth daily.   ezetimibe 10 MG tablet Commonly known as: ZETIA Take 10 mg by mouth daily.   Fe Fum-Vit C-Vit B12-FA Caps capsule Commonly known as: TRIGELS-F FORTE Take 1 capsule by mouth daily after breakfast.   isosorbide mononitrate 30 MG 24 hr tablet Commonly  known as: IMDUR Take 30 mg by mouth daily.   losartan 100 MG tablet Commonly known as: COZAAR Take 100 mg by mouth daily.   magnesium chloride 64 MG Tbec SR tablet Commonly known as: SLOW-MAG Take 1 tablet by mouth daily.   multivitamin capsule Take 1 capsule by mouth daily.   nitroGLYCERIN 0.4 MG SL tablet Commonly known as: NITROSTAT Place 0.4 mg under the tongue every 5 (five) minutes as needed for chest pain.   pantoprazole 40 MG tablet Commonly known as: PROTONIX Take 40 mg by mouth daily.   ticagrelor 90 MG Tabs tablet Commonly known as: BRILINTA Take 1 tablet (90 mg total) by mouth 2 (two) times daily. Hold until you discuss with your doctor What changed: additional instructions        Follow-up Information     Lauro Regulus, MD. Schedule an appointment as soon as possible for a visit in 1 week(s).   Specialty: Internal Medicine Contact information: [address] [facility] [name] [address] [phone]  Discharge Exam: Filed Weights   12/19/22 1238  Weight: 79 kg   General.  Frail elderly lady, in no acute distress. Pulmonary.  Lungs clear bilaterally, normal respiratory effort. CV.  Regular rate and rhythm, no JVD, rub or murmur. Abdomen.  Soft, nontender, nondistended, BS positive. CNS.  Alert and oriented .  No focal neurologic deficit. Extremities.  No edema, no cyanosis, pulses intact and symmetrical.  Left hip hematoma Psychiatry.  Judgment and insight appears normal.   Condition at discharge: stable  The results of significant diagnostics from this hospitalization (including imaging, microbiology, ancillary and laboratory) are listed below for reference.   Imaging Studies: CT PELVIS W CONTRAST  Result Date: 12/19/2022 CLINICAL DATA:  Fall with hematoma EXAM: CT PELVIS WITH CONTRAST TECHNIQUE: Multidetector CT imaging of the pelvis was performed using the standard protocol following the  bolus administration of intravenous contrast. RADIATION DOSE REDUCTION: This exam was performed according to the departmental dose-optimization program which includes automated exposure control, adjustment of the mA and/or kV according to patient size and/or use of iterative reconstruction technique. CONTRAST:  80mL OMNIPAQUE IOHEXOL 300 MG/ML  SOLN COMPARISON:  CT 06/02/2015 FINDINGS: Urinary Tract:  Bladder is unremarkable Bowel:  No inflammatory bowel process is evident. Vascular/Lymphatic: Advanced aortic atherosclerosis. No aneurysm. No suspicious lymph nodes. Reproductive:  Hysterectomy.  No adnexal mass Other:  Negative for pelvic effusion.  No free air Musculoskeletal: No fracture or malalignment. Partially visualized lumbar spinal fusion hardware at L4-L5. Within the deep subcutaneous soft tissues of the lateral left hip, there is a hyperdense mass, this measures approximately 7.6 by 3.6 by 13.9 cm and is consistent with a soft tissue hematoma. There is surrounding soft tissue stranding and edema. Possible small focus of extravasation at the inferior margin of the hematoma, series 3, image 45 and delayed series 2 image 44. IMPRESSION: 1. No CT evidence for acute osseous abnormality. 2. 7.6 x 3.6 x 13.9 cm hyperdense mass within the deep subcutaneous soft tissues of the lateral left hip with surrounding soft tissue stranding and edema consistent with hematoma. Possible small focus of extravasation/bleeding at the inferior margin of the hematoma. 3. Aortic atherosclerosis. Aortic Atherosclerosis (ICD10-I70.0). Critical Value/emergent results were called by telephone at the time of interpretation on 12/19/2022 at 3:39 pm to provider Dr. Larinda Buttery, Who verbally acknowledged these results. Electronically Signed   By: Jasmine Pang M.D.   On: 12/19/2022 15:40   DG Hip Unilat With Pelvis 2-3 Views Left  Result Date: 12/19/2022 CLINICAL DATA:  Trauma, fall EXAM: DG HIP (WITH OR WITHOUT PELVIS) 2-3V LEFT COMPARISON:   None Available. FINDINGS: No recent fracture or dislocation is seen. Small bony spurs are noted in the lateral aspects of both hips. Scattered arterial calcifications are seen in the soft tissues. There is surgical fusion at the L4-L5 level in lumbar spine. IMPRESSION: No recent fracture or dislocation is seen pelvis and left hip. Electronically Signed   By: Ernie Avena M.D.   On: 12/19/2022 13:23   CT Cervical Spine Wo Contrast  Result Date: 12/19/2022 CLINICAL DATA:  Head trauma, status post fall EXAM: CT HEAD WITHOUT CONTRAST CT CERVICAL SPINE WITHOUT CONTRAST TECHNIQUE: Multidetector CT imaging of the head and cervical spine was performed following the standard protocol without intravenous contrast. Multiplanar CT image reconstructions of the cervical spine were also generated. RADIATION DOSE REDUCTION: This exam was performed according to the departmental dose-optimization program which includes automated exposure control, adjustment of the mA and/or kV according to patient size and/or  use of iterative reconstruction technique. COMPARISON:  None Available. FINDINGS: CT HEAD FINDINGS Brain: No evidence of acute infarction, hemorrhage, extra-axial collection, ventriculomegaly, or mass effect. Left upper lobe encephalomalacia from prior insult. Left subinsular infarct. Generalized cerebral atrophy. Periventricular white matter low attenuation likely secondary to microangiopathy. Vascular: Cerebrovascular atherosclerotic calcifications are noted. No hyperdense vessels. Skull: Negative for fracture or focal lesion. Sinuses/Orbits: Visualized portions of the orbits are unremarkable. Visualized portions of the paranasal sinuses are unremarkable. Visualized portions of the mastoid air cells are unremarkable. Other: None. CT CERVICAL SPINE FINDINGS Alignment: 2 mm anterolisthesis of C3 on C4 secondary to facet disease. Skull base and vertebrae: No acute fracture. No primary bone lesion or focal pathologic  process. Diffuse osteosclerosis throughout the visualized cervical spine as can be seen with nephropathy. Soft tissues and spinal canal: No prevertebral fluid or swelling. No visible canal hematoma. Disc levels: At C4-5 there is a mild broad-based disc bulge C4-5. Severe right facet arthropathy at C2-3, C3-4, C4-5, C5-6 and C7-T1. Moderate right facet arthropathy at C6-7. Upper chest: Lung apices are clear. Other: No fluid collection or hematoma. Bilateral carotid artery atherosclerosis. IMPRESSION: 1. No acute intracranial pathology. 2. No acute osseous injury of the cervical spine. 3. Diffuse osteosclerosis throughout the visualized cervical spine as can be seen with nephropathy. Correlate with laboratory values. Electronically Signed   By: Elige Ko M.D.   On: 12/19/2022 13:11   CT Head Wo Contrast  Result Date: 12/19/2022 CLINICAL DATA:  Head trauma, status post fall EXAM: CT HEAD WITHOUT CONTRAST CT CERVICAL SPINE WITHOUT CONTRAST TECHNIQUE: Multidetector CT imaging of the head and cervical spine was performed following the standard protocol without intravenous contrast. Multiplanar CT image reconstructions of the cervical spine were also generated. RADIATION DOSE REDUCTION: This exam was performed according to the departmental dose-optimization program which includes automated exposure control, adjustment of the mA and/or kV according to patient size and/or use of iterative reconstruction technique. COMPARISON:  None Available. FINDINGS: CT HEAD FINDINGS Brain: No evidence of acute infarction, hemorrhage, extra-axial collection, ventriculomegaly, or mass effect. Left upper lobe encephalomalacia from prior insult. Left subinsular infarct. Generalized cerebral atrophy. Periventricular white matter low attenuation likely secondary to microangiopathy. Vascular: Cerebrovascular atherosclerotic calcifications are noted. No hyperdense vessels. Skull: Negative for fracture or focal lesion. Sinuses/Orbits:  Visualized portions of the orbits are unremarkable. Visualized portions of the paranasal sinuses are unremarkable. Visualized portions of the mastoid air cells are unremarkable. Other: None. CT CERVICAL SPINE FINDINGS Alignment: 2 mm anterolisthesis of C3 on C4 secondary to facet disease. Skull base and vertebrae: No acute fracture. No primary bone lesion or focal pathologic process. Diffuse osteosclerosis throughout the visualized cervical spine as can be seen with nephropathy. Soft tissues and spinal canal: No prevertebral fluid or swelling. No visible canal hematoma. Disc levels: At C4-5 there is a mild broad-based disc bulge C4-5. Severe right facet arthropathy at C2-3, C3-4, C4-5, C5-6 and C7-T1. Moderate right facet arthropathy at C6-7. Upper chest: Lung apices are clear. Other: No fluid collection or hematoma. Bilateral carotid artery atherosclerosis. IMPRESSION: 1. No acute intracranial pathology. 2. No acute osseous injury of the cervical spine. 3. Diffuse osteosclerosis throughout the visualized cervical spine as can be seen with nephropathy. Correlate with laboratory values. Electronically Signed   By: Elige Ko M.D.   On: 12/19/2022 13:11    Microbiology: Results for orders placed or performed during the hospital encounter of 11/16/21  MRSA Next Gen by PCR, Nasal     Status: None  Collection Time: 11/16/21 10:56 PM   Specimen: Nasal Mucosa; Nasal Swab  Result Value Ref Range Status   MRSA by PCR Next Gen NOT DETECTED NOT DETECTED Final    Comment: (NOTE) The GeneXpert MRSA Assay (FDA approved for NASAL specimens only), is one component of a comprehensive MRSA colonization surveillance program. It is not intended to diagnose MRSA infection nor to guide or monitor treatment for MRSA infections. Test performance is not FDA approved in patients less than 63 years old. Performed at [facility], [address], [place], [name] [address]   Resp Panel by RT-PCR (Flu A&B, Covid)  Nasopharyngeal Swab     Status: None   Collection Time: 11/25/21  8:01 AM   Specimen: Nasopharyngeal Swab; Nasopharyngeal(NP) swabs in vial transport medium  Result Value Ref Range Status   SARS Coronavirus 2 by RT PCR NEGATIVE NEGATIVE Final    Comment: (NOTE) SARS-CoV-2 target nucleic acids are NOT DETECTED.  The SARS-CoV-2 RNA is generally detectable in upper respiratory specimens during the acute phase of infection. The lowest concentration of SARS-CoV-2 viral copies this assay can detect is 138 copies/mL. A negative result does not preclude SARS-Cov-2 infection and should not be used as the sole basis for treatment or other patient management decisions. A negative result may occur with  improper specimen collection/handling, submission of specimen other than nasopharyngeal swab, presence of viral mutation(s) within the areas targeted by this assay, and inadequate number of viral copies(<138 copies/mL). A negative result must be combined with clinical observations, patient history, and epidemiological information. The expected result is Negative.  Fact Sheet for Patients:  BloggerCourse.com  Fact Sheet for Healthcare Providers:  SeriousBroker.it  This test is no t yet approved or cleared by the [place] FDA and  has been authorized for detection and/or diagnosis of SARS-CoV-2 by FDA under an Emergency Use Authorization (EUA). This EUA will remain  in effect (meaning this test can be used) for the duration of the COVID-19 declaration under Section 564(b)(1) of the Act, 21 U.S.C.section 360bbb-3(b)(1), unless the authorization is terminated  or revoked sooner.       Influenza A by PCR NEGATIVE NEGATIVE Final   Influenza B by PCR NEGATIVE NEGATIVE Final    Comment: (NOTE) The Xpert Xpress SARS-CoV-2/FLU/RSV plus assay is intended as an aid in the diagnosis of influenza from Nasopharyngeal swab specimens and should not be  used as a sole basis for treatment. Nasal washings and aspirates are unacceptable for Xpert Xpress SARS-CoV-2/FLU/RSV testing.  Fact Sheet for Patients: BloggerCourse.com  Fact Sheet for Healthcare Providers: SeriousBroker.it  This test is not yet approved or cleared by the [place] FDA and has been authorized for detection and/or diagnosis of SARS-CoV-2 by FDA under an Emergency Use Authorization (EUA). This EUA will remain in effect (meaning this test can be used) for the duration of the COVID-19 declaration under Section 564(b)(1) of the Act, 21 U.S.C. section 360bbb-3(b)(1), unless the authorization is terminated or revoked.  Performed at [facility], [address], [place], [name] [address]     Labs: CBC: Recent Labs  Lab 12/19/22 1325 12/19/22 2251 12/20/22 0450 12/21/22 0518  WBC 9.6  --  11.0* 9.5  NEUTROABS 7.0  --   --   --   HGB 11.4* 9.9* 9.7* 8.6*  HCT 34.3* 29.7* 30.9* 26.4*  MCV 91.7  --  96.3 93.0  PLT 241  --  197 210   Basic Metabolic Panel: Recent Labs  Lab  12/19/22 1325 12/19/22 2251 12/20/22 0450 12/21/22 0518  NA 138  --  139 139  K 3.1*  --  3.2* 3.8  CL 107  --  109 110  CO2 19*  --  23 23  GLUCOSE 115*  --  111* 114*  BUN 26*  --  29* 26*  CREATININE 1.14*  --  1.28* 1.15*  CALCIUM 8.2*  --  7.8* 7.8*  MG  --  1.4*  --   --    Liver Function Tests: Recent Labs  Lab 12/20/22 0450  AST 17  ALT 15  ALKPHOS 49  BILITOT 0.8  PROT 6.1*  ALBUMIN 3.3*   CBG: No results for input(s): "GLUCAP" in the last 168 hours.  Discharge time spent: greater than 30 minutes.  This record has been created using Conservation officer, historic buildings. Errors have been sought and corrected,but may not always be located. Such creation errors do not reflect on the standard of care.   Signed: Arnetha Courser, MD [facility] 12/21/2022

## 2022-12-21 NOTE — Progress Notes (Signed)
Physical Therapy Treatment Patient Details Name: Cassidy Hernandez MRN: [id number] DOB: May 10, 1941 Today's Date: 12/21/2022   History of Present Illness Lilyana Pazienza is an 42yoF  PMH: CVA c residual speech deficits, flat affect, unilateral foot drop, stage III CKD, CAD, dCHF, HLD, HTN. Pt comes to [facility] ED after fall, hit her head adn hip, imaging revealing of left hip hematoma.  At baseline pt lives at home alone, heavy support from multiple family members (sister, DTR, friend). Patient denies any weakness or dizziness prior to fall.    PT Comments    Pt done with breakfast, excited to announce she is going home today. Pt agreeable to some AMB. Pt notably stronger with all mobility today, very close to baseline mobility. Pt AMB around unit, then agreeable to sit up in chair. Pt reports confidence in safe transition back to home today, despite some remaining pain in hip. Tylenol appears to be getting the job done. Sister in room later, expresses confidence in continuing to provide assistance at needed.      Recommendations for follow up therapy are one component of a multi-disciplinary discharge planning process, led by the attending physician.  Recommendations may be updated based on patient status, additional functional criteria and insurance authorization.  Follow Up Recommendations       Assistance Recommended at Discharge Intermittent Supervision/Assistance  Patient can return home with the following A little help with walking and/or transfers;Assist for transportation;Assistance with cooking/housework   Equipment Recommendations  None recommended by PT    Recommendations for Other Services       Precautions / Restrictions Precautions Precautions: Fall Restrictions Weight Bearing Restrictions: No     Mobility  Bed Mobility Overal bed mobility: Needs Assistance Bed Mobility: Supine to Sit, Sit to Supine     Supine to sit: Min assist     General bed mobility comments: still  minA but definitely stronger than previous day    Transfers Overall transfer level: Needs assistance Equipment used: Rolling walker (2 wheels) Transfers: Sit to/from Stand Sit to Stand: Supervision           General transfer comment: pt dons shoes modI; stands with RW with additonal effort, good balance control, very much at baseline    Ambulation/Gait Ambulation/Gait assistance: Min guard Gait Distance (Feet): 200 Feet Assistive device: Rolling walker (2 wheels) Gait Pattern/deviations: Step-through pattern Gait velocity: 0.34m/s     General Gait Details: well compensated chronic foot drop, no AFO, wearing shoes.   Stairs             Wheelchair Mobility    Modified Rankin (Stroke Patients Only)       Balance                                            Cognition Arousal/Alertness: Awake/alert Behavior During Therapy: WFL for tasks assessed/performed Overall Cognitive Status: Within Functional Limits for tasks assessed                                          Exercises      General Comments        Pertinent Vitals/Pain Pain Assessment Pain Assessment: 0-10 Pain Score: 4  Pain Location: L hip while walking Pain Descriptors / Indicators: Aching, Sore Pain Intervention(s): Limited  activity within patient's tolerance, Monitored during session, Premedicated before session    Home Living                          Prior Function            PT Goals (current goals can now be found in the care plan section) Acute Rehab PT Goals Patient Stated Goal: return to home, avoid future falls PT Goal Formulation: With patient Time For Goal Achievement: 01/03/23 Potential to Achieve Goals: Fair Progress towards PT goals: Progressing toward goals    Frequency    Min 4X/week      PT Plan Current plan remains appropriate    Co-evaluation              AM-PAC PT "6 Clicks" Mobility   Outcome  Measure  Help needed turning from your back to your side while in a flat bed without using bedrails?: A Lot Help needed moving from lying on your back to sitting on the side of a flat bed without using bedrails?: A Lot Help needed moving to and from a bed to a chair (including a wheelchair)?: A Little Help needed standing up from a chair using your arms (e.g., wheelchair or bedside chair)?: A Little Help needed to walk in hospital room?: A Little Help needed climbing 3-5 steps with a railing? : A Little 6 Click Score: 16    End of Session Equipment Utilized During Treatment: Gait belt Activity Tolerance: Patient tolerated treatment well;Patient limited by pain Patient left: in bed;with call bell/phone within reach Nurse Communication: Mobility status PT Visit Diagnosis: Difficulty in walking, not elsewhere classified (R26.2);Muscle weakness (generalized) (M62.81);Other abnormalities of gait and mobility (R26.89)     Time: 1610-9604 PT Time Calculation (min) (ACUTE ONLY): 20 min  Charges:  $Therapeutic Activity: 8-22 mins                    4:08 PM, 12/21/22 Rosamaria Lints, PT, DPT Physical Therapist - [facility]  [phone] (ASCOM)    Aundra Espin C 12/21/2022, 4:05 PM

## 2023-02-27 ENCOUNTER — Encounter: Payer: Self-pay | Admitting: Student

## 2023-02-27 ENCOUNTER — Non-Acute Institutional Stay: Payer: Medicare Other | Admitting: Student

## 2023-02-27 DIAGNOSIS — M4696 Unspecified inflammatory spondylopathy, lumbar region: Secondary | ICD-10-CM | POA: Insufficient documentation

## 2023-02-27 DIAGNOSIS — I6529 Occlusion and stenosis of unspecified carotid artery: Secondary | ICD-10-CM

## 2023-02-27 DIAGNOSIS — N183 Chronic kidney disease, stage 3 unspecified: Secondary | ICD-10-CM

## 2023-02-27 DIAGNOSIS — G8191 Hemiplegia, unspecified affecting right dominant side: Secondary | ICD-10-CM | POA: Diagnosis not present

## 2023-02-27 DIAGNOSIS — D649 Anemia, unspecified: Secondary | ICD-10-CM

## 2023-02-27 DIAGNOSIS — I129 Hypertensive chronic kidney disease with stage 1 through stage 4 chronic kidney disease, or unspecified chronic kidney disease: Secondary | ICD-10-CM

## 2023-02-27 DIAGNOSIS — F325 Major depressive disorder, single episode, in full remission: Secondary | ICD-10-CM | POA: Diagnosis not present

## 2023-02-27 DIAGNOSIS — E7800 Pure hypercholesterolemia, unspecified: Secondary | ICD-10-CM

## 2023-02-27 DIAGNOSIS — Z8673 Personal history of transient ischemic attack (TIA), and cerebral infarction without residual deficits: Secondary | ICD-10-CM

## 2023-02-27 DIAGNOSIS — W19XXXD Unspecified fall, subsequent encounter: Secondary | ICD-10-CM

## 2023-02-27 MED ORDER — TICAGRELOR 90 MG PO TABS
90.0000 mg | ORAL_TABLET | Freq: Two times a day (BID) | ORAL | Status: DC
Start: 2023-02-27 — End: 2023-10-26

## 2023-02-27 NOTE — Patient Instructions (Addendum)
Please take Lexapro (Escitalopram) 1/2 tablet nightly for 2 weeks. Then, take it every other day for 2 weeks. Then stop.   Please take Protonix (Pantoprazole) 40 mg in the morning and evening to help with your acid reflux.

## 2023-02-27 NOTE — Progress Notes (Deleted)
Provider:   Location:      Place of Service:     PCP: Earnestine Mealing, MD Patient Care Team: Earnestine Mealing, MD as PCP - General (Family Medicine)  Extended Emergency Contact Information Primary Emergency Contact: Clovis Pu Address: [address], [name] [address] [place] Home Phone: [phone] Relation: Daughter Secondary Emergency Contact: Komada,Michael Mobile Phone: [phone] Relation: Other  Code Status: *** Goals of Care: Advanced Directive information    02/27/2023    2:34 PM  Advanced Directives  Does Patient Have a Medical Advance Directive? Yes  Type of Estate agent of [place];Living will  Does patient want to make changes to medical advance directive? No - Patient declined  Copy of Healthcare Power of Attorney in Chart? Yes - validated most recent copy scanned in chart (See row information)      Chief Complaint  Patient presents with   Acute Visit    New admit to [facility]    HPI: Patient is a 82 y.o. female seen today for admission to [facility]  She moved in from the community just last week.   Hyertension- slightly elevated today. She takes 4 medications DM- she has pre diabetes.  Hx of stroke - 2023.   Hx of falls - occurred when she turned around in the kitchen to put something in it and she fell. She has had a few different falls this year including the one in the hospital.  GERD - worse recently. Bending over is when it's worse.   Bathing independent. Does her pill box. Doesn't drive anymore.   Memory - no concerns at this time.  She just got back from an ice cream trip. She had banana ice cream. She does exercise and Bible Study.   She is from [place]. Retired from Research officer, political party. She was at [employer] for 17 years. Daughter is nearby in [place]. She has a brother and sister here in .    Matters Most: Speech and Ambulation.   Past Medical History:  Diagnosis Date    Anginal pain (HCC)    Aortic atherosclerosis    Cancer (HCC)    Carotid artery stenosis 02/01/2010   a.) Doppler 02/01/2010: 72% LICA and 60% RICA. b.) Doppler 12/30/2014: >70% LICA and 70% RICA. c.) Doppler 10/25/16: >70% Bilater ICAs   Chronic bilateral low back pain with left-sided sciatica    CKD (chronic kidney disease), stage III (HCC)    Coronary artery disease    a.) LHC 12/24/2014 -> small LAD system; diffuse CTO of LAD. 60% RCA. LM and LCx with no sig disease. no intervention; med mgmt.   Degenerative arthritis    Diastolic dysfunction    a.) TTE 09/20/2018: EF 55%, G1DD, mild LAE and RVE, triv-mild panvalvular regurgitation.   Dysarthria    GERD (gastroesophageal reflux disease)    Hepatic steatosis    History of kidney stones    HLD (hyperlipidemia)    Hypertension    Kidney stone    Lumbar radiculopathy    Major depression in remission (HCC)    PVD (peripheral vascular disease) (HCC)    Renal cyst, right 06/02/2015   a.) CT 06/02/2015 --> 4.4 cm RIGHT renal mass; MRI recommended. b.) MRI 06/16/2015 --> 3.8 x 4.2 x 4.0 cm proteinaceous/hemmorhagic cyst.   Subclavian steal syndrome    a.) LHC 12/24/2014 --> tight eccentric 90% ostial stenosis with damping.   T2DM (type 2 diabetes mellitus) (HCC)  Past Surgical History:  Procedure Laterality Date   ABDOMINAL HYSTERECTOMY     AUGMENTATION MAMMAPLASTY Bilateral 1980   BACK SURGERY  09/2018   BICEPT TENODESIS  06/10/2021   Procedure: BICEPS TENODESIS;  Surgeon: Christena Flake, MD;  Location: [facility] ORS;  Service: Orthopedics;;   CARDIAC CATHETERIZATION Left 12/24/2014   Procedure: LEFT HEARD CATHETERIZATION; Location: [facility]; Surgeon: Ander Purpura, MD   CHOLECYSTECTOMY     COLONOSCOPY N/A 02/11/2016   Procedure: COLONOSCOPY;  Surgeon: Scot Jun, MD;  Location: [facility]COPY;  Service: Endoscopy;  Laterality: N/A;   IR CT HEAD LTD  11/16/2021   IR CT HEAD LTD  11/16/2021   IR INTRAVSC STENT CERV CAROTID W/O EMB-PROT  MOD SED INC ANGIO  11/18/2021   IR PERCUTANEOUS ART THROMBECTOMY/INFUSION INTRACRANIAL INC DIAG ANGIO  11/16/2021   RADIOLOGY WITH ANESTHESIA N/A 11/16/2021   Procedure: IR WITH ANESTHESIA- CODE STROKE;  Surgeon: Radiologist, Medication, MD;  Location: [facility] OR;  Service: Radiology;  Laterality: N/A;   REVERSE SHOULDER ARTHROPLASTY Right 06/10/2021   Procedure: REVERSE SHOULDER ARTHROPLASTY;  Surgeon: Christena Flake, MD;  Location: [facility] ORS;  Service: Orthopedics;  Laterality: Right;    reports that she has never smoked. She has never used smokeless tobacco. She reports that she does not drink alcohol and does not use drugs. Social History   Socioeconomic History   Marital status: Married    Spouse name: Not on file   Number of children: Not on file   Years of education: Not on file   Highest education level: Not on file  Occupational History   Not on file  Tobacco Use   Smoking status: Never   Smokeless tobacco: Never  Vaping Use   Vaping status: Never Used  Substance and Sexual Activity   Alcohol use: Never   Drug use: Never   Sexual activity: Not Currently  Other Topics Concern   Not on file  Social History Narrative   Not on file   Social Determinants of Health   Financial Resource Strain: Not on file  Food Insecurity: No Food Insecurity (12/20/2022)   Hunger Vital Sign    Worried About Running Out of Food in the Last Year: Never true    Ran Out of Food in the Last Year: Never true  Transportation Needs: No Transportation Needs (12/20/2022)   PRAPARE - Administrator, Civil Service (Medical): No    Lack of Transportation (Non-Medical): No  Physical Activity: Not on file  Stress: Not on file  Social Connections: Not on file  Intimate Partner Violence: Not At Risk (12/20/2022)   Humiliation, Afraid, Rape, and Kick questionnaire    Fear of Current or Ex-Partner: No    Emotionally Abused: No    Physically Abused: No    Sexually Abused: No    Functional Status  Survey:    Family History  Problem Relation Age of Onset   Heart attack Mother    Heart disease Mother    Heart attack Father    Heart disease Father    Breast cancer Neg Hx     Health Maintenance  Topic Date Due   Medicare Annual Wellness (AWV)  Never done   FOOT EXAM  Never done   OPHTHALMOLOGY EXAM  Never done   Diabetic kidney evaluation - Urine ACR  Never done   DTaP/Tdap/Td (1 - Tdap) Never done   Pneumonia Vaccine 46+ Years old (1 of 1 - PCV) Never done   DEXA SCAN  Never done   COVID-19 Vaccine (7 - 2023-24 season) 04/08/2022   HEMOGLOBIN A1C  05/19/2022   INFLUENZA VACCINE  03/09/2023   Diabetic kidney evaluation - eGFR measurement  12/21/2023   Zoster Vaccines- Shingrix  Completed   HPV VACCINES  Aged Out    Allergies  Allergen Reactions   Sulfa Antibiotics Other (See Comments)    Outpatient Encounter Medications as of 02/27/2023  Medication Sig   aspirin 81 MG chewable tablet Chew 1 tablet (81 mg total) by mouth daily.   atorvastatin (LIPITOR) 80 MG tablet Take 80 mg by mouth daily.   bismuth subsalicylate (PEPTO BISMOL) 262 MG/15ML suspension Take 10 mLs by mouth as needed.   Calcium & Magnesium Carbonates (MYLANTA PO) Take by mouth. Give 2 Tbsp by mouth every 4 hours as needed for gas, indigestion, or upset stomach supervised self-administration   Carbamide Peroxide (DEBROX OT) Place 5 drops in ear(s).   carvedilol (COREG) 3.125 MG tablet Take 3.125 mg by mouth 2 (two) times daily.   chlorthalidone (HYGROTON) 25 MG tablet Take 25 mg by mouth daily.   docusate sodium (COLACE) 100 MG capsule Take 100 mg by mouth 2 (two) times daily.   escitalopram (LEXAPRO) 20 MG tablet Take 20 mg by mouth daily.   ezetimibe (ZETIA) 10 MG tablet Take 10 mg by mouth daily.   Glucose 15 GM/32ML GEL Take 1 packet by mouth as needed. For low blood sugar   isosorbide mononitrate (IMDUR) 30 MG 24 hr tablet Take 30 mg by mouth daily.   losartan (COZAAR) 100 MG tablet Take 100 mg by  mouth daily.    magnesium hydroxide (MILK OF MAGNESIA) 400 MG/5ML suspension Take 30 mLs by mouth daily as needed for mild constipation.   nitroGLYCERIN (NITROSTAT) 0.4 MG SL tablet Place 0.4 mg under the tongue every 5 (five) minutes as needed for chest pain.   nystatin (MYCOSTATIN/NYSTOP) powder Apply 1 Application topically 2 (two) times daily as needed.   ondansetron (ZOFRAN) 4 MG tablet Take 4 mg by mouth every 8 (eight) hours as needed for nausea or vomiting.   acetaminophen (TYLENOL) 325 MG tablet Take 650 mg by mouth every 8 (eight) hours as needed for moderate pain or mild pain (Back).   Fe Fum-Vit C-Vit B12-FA (TRIGELS-F FORTE) CAPS capsule Take 1 capsule by mouth daily after breakfast.   magnesium chloride (SLOW-MAG) 64 MG TBEC SR tablet Take 1 tablet by mouth daily.   Multiple Vitamin (MULTIVITAMIN) capsule Take 1 capsule by mouth daily.   pantoprazole (PROTONIX) 40 MG tablet Take 40 mg by mouth daily.   ticagrelor (BRILINTA) 90 MG TABS tablet Take 1 tablet (90 mg total) by mouth 2 (two) times daily. Hold until you discuss with your doctor   [DISCONTINUED] docusate sodium (COLACE) 100 MG capsule Take 1 capsule (100 mg total) by mouth 2 (two) times daily. (Patient taking differently: Take 100 mg by mouth 2 (two) times daily as needed for mild constipation.)   No facility-administered encounter medications on file as of 02/27/2023.    Review of Systems  Vitals:   02/27/23 1431  BP: (!) 141/61  Pulse: 74  Resp: 18  Temp: 97.8 F (36.6 C)  SpO2: 94%  Weight: 149 lb 9.6 oz (67.9 kg)  Height: 5\' 3"  (1.6 m)   Body mass index is 26.5 kg/m. Physical Exam  Labs reviewed: Basic Metabolic Panel: Recent Labs    12/19/22 1325 12/19/22 2251 12/20/22 0450 12/21/22 0518  NA 138  --  139  139  K 3.1*  --  3.2* 3.8  CL 107  --  109 110  CO2 19*  --  23 23  GLUCOSE 115*  --  111* 114*  BUN 26*  --  29* 26*  CREATININE 1.14*  --  1.28* 1.15*  CALCIUM 8.2*  --  7.8* 7.8*  MG  --   1.4*  --   --    Liver Function Tests: Recent Labs    12/20/22 0450  AST 17  ALT 15  ALKPHOS 49  BILITOT 0.8  PROT 6.1*  ALBUMIN 3.3*   No results for input(s): "LIPASE", "AMYLASE" in the last 8760 hours. No results for input(s): "AMMONIA" in the last 8760 hours. CBC: Recent Labs    12/19/22 1325 12/19/22 2251 12/20/22 0450 12/21/22 0518  WBC 9.6  --  11.0* 9.5  NEUTROABS 7.0  --   --   --   HGB 11.4* 9.9* 9.7* 8.6*  HCT 34.3* 29.7* 30.9* 26.4*  MCV 91.7  --  96.3 93.0  PLT 241  --  197 210   Cardiac Enzymes: Recent Labs    12/19/22 1512  CKTOTAL 150   BNP: Invalid input(s): "POCBNP" Lab Results  Component Value Date   HGBA1C 6.5 (H) 11/17/2021   No results found for: "TSH" No results found for: "VITAMINB12" No results found for: "FOLATE" No results found for: "IRON", "TIBC", "FERRITIN"  Imaging and Procedures obtained prior to SNF admission: CT PELVIS W CONTRAST  Result Date: 12/19/2022 CLINICAL DATA:  Fall with hematoma EXAM: CT PELVIS WITH CONTRAST TECHNIQUE: Multidetector CT imaging of the pelvis was performed using the standard protocol following the bolus administration of intravenous contrast. RADIATION DOSE REDUCTION: This exam was performed according to the departmental dose-optimization program which includes automated exposure control, adjustment of the mA and/or kV according to patient size and/or use of iterative reconstruction technique. CONTRAST:  80mL OMNIPAQUE IOHEXOL 300 MG/ML  SOLN COMPARISON:  CT 06/02/2015 FINDINGS: Urinary Tract:  Bladder is unremarkable Bowel:  No inflammatory bowel process is evident. Vascular/Lymphatic: Advanced aortic atherosclerosis. No aneurysm. No suspicious lymph nodes. Reproductive:  Hysterectomy.  No adnexal mass Other:  Negative for pelvic effusion.  No free air Musculoskeletal: No fracture or malalignment. Partially visualized lumbar spinal fusion hardware at L4-L5. Within the deep subcutaneous soft tissues of the  lateral left hip, there is a hyperdense mass, this measures approximately 7.6 by 3.6 by 13.9 cm and is consistent with a soft tissue hematoma. There is surrounding soft tissue stranding and edema. Possible small focus of extravasation at the inferior margin of the hematoma, series 3, image 45 and delayed series 2 image 44. IMPRESSION: 1. No CT evidence for acute osseous abnormality. 2. 7.6 x 3.6 x 13.9 cm hyperdense mass within the deep subcutaneous soft tissues of the lateral left hip with surrounding soft tissue stranding and edema consistent with hematoma. Possible small focus of extravasation/bleeding at the inferior margin of the hematoma. 3. Aortic atherosclerosis. Aortic Atherosclerosis (ICD10-I70.0). Critical Value/emergent results were called by telephone at the time of interpretation on 12/19/2022 at 3:39 pm to provider Dr. Larinda Buttery, Who verbally acknowledged these results. Electronically Signed   By: Jasmine Pang M.D.   On: 12/19/2022 15:40   DG Hip Unilat With Pelvis 2-3 Views Left  Result Date: 12/19/2022 CLINICAL DATA:  Trauma, fall EXAM: DG HIP (WITH OR WITHOUT PELVIS) 2-3V LEFT COMPARISON:  None Available. FINDINGS: No recent fracture or dislocation is seen. Small bony spurs are noted in the lateral  aspects of both hips. Scattered arterial calcifications are seen in the soft tissues. There is surgical fusion at the L4-L5 level in lumbar spine. IMPRESSION: No recent fracture or dislocation is seen pelvis and left hip. Electronically Signed   By: Ernie Avena M.D.   On: 12/19/2022 13:23   CT Cervical Spine Wo Contrast  Result Date: 12/19/2022 CLINICAL DATA:  Head trauma, status post fall EXAM: CT HEAD WITHOUT CONTRAST CT CERVICAL SPINE WITHOUT CONTRAST TECHNIQUE: Multidetector CT imaging of the head and cervical spine was performed following the standard protocol without intravenous contrast. Multiplanar CT image reconstructions of the cervical spine were also generated. RADIATION DOSE  REDUCTION: This exam was performed according to the departmental dose-optimization program which includes automated exposure control, adjustment of the mA and/or kV according to patient size and/or use of iterative reconstruction technique. COMPARISON:  None Available. FINDINGS: CT HEAD FINDINGS Brain: No evidence of acute infarction, hemorrhage, extra-axial collection, ventriculomegaly, or mass effect. Left upper lobe encephalomalacia from prior insult. Left subinsular infarct. Generalized cerebral atrophy. Periventricular white matter low attenuation likely secondary to microangiopathy. Vascular: Cerebrovascular atherosclerotic calcifications are noted. No hyperdense vessels. Skull: Negative for fracture or focal lesion. Sinuses/Orbits: Visualized portions of the orbits are unremarkable. Visualized portions of the paranasal sinuses are unremarkable. Visualized portions of the mastoid air cells are unremarkable. Other: None. CT CERVICAL SPINE FINDINGS Alignment: 2 mm anterolisthesis of C3 on C4 secondary to facet disease. Skull base and vertebrae: No acute fracture. No primary bone lesion or focal pathologic process. Diffuse osteosclerosis throughout the visualized cervical spine as can be seen with nephropathy. Soft tissues and spinal canal: No prevertebral fluid or swelling. No visible canal hematoma. Disc levels: At C4-5 there is a mild broad-based disc bulge C4-5. Severe right facet arthropathy at C2-3, C3-4, C4-5, C5-6 and C7-T1. Moderate right facet arthropathy at C6-7. Upper chest: Lung apices are clear. Other: No fluid collection or hematoma. Bilateral carotid artery atherosclerosis. IMPRESSION: 1. No acute intracranial pathology. 2. No acute osseous injury of the cervical spine. 3. Diffuse osteosclerosis throughout the visualized cervical spine as can be seen with nephropathy. Correlate with laboratory values. Electronically Signed   By: Elige Ko M.D.   On: 12/19/2022 13:11   CT Head Wo  Contrast  Result Date: 12/19/2022 CLINICAL DATA:  Head trauma, status post fall EXAM: CT HEAD WITHOUT CONTRAST CT CERVICAL SPINE WITHOUT CONTRAST TECHNIQUE: Multidetector CT imaging of the head and cervical spine was performed following the standard protocol without intravenous contrast. Multiplanar CT image reconstructions of the cervical spine were also generated. RADIATION DOSE REDUCTION: This exam was performed according to the departmental dose-optimization program which includes automated exposure control, adjustment of the mA and/or kV according to patient size and/or use of iterative reconstruction technique. COMPARISON:  None Available. FINDINGS: CT HEAD FINDINGS Brain: No evidence of acute infarction, hemorrhage, extra-axial collection, ventriculomegaly, or mass effect. Left upper lobe encephalomalacia from prior insult. Left subinsular infarct. Generalized cerebral atrophy. Periventricular white matter low attenuation likely secondary to microangiopathy. Vascular: Cerebrovascular atherosclerotic calcifications are noted. No hyperdense vessels. Skull: Negative for fracture or focal lesion. Sinuses/Orbits: Visualized portions of the orbits are unremarkable. Visualized portions of the paranasal sinuses are unremarkable. Visualized portions of the mastoid air cells are unremarkable. Other: None. CT CERVICAL SPINE FINDINGS Alignment: 2 mm anterolisthesis of C3 on C4 secondary to facet disease. Skull base and vertebrae: No acute fracture. No primary bone lesion or focal pathologic process. Diffuse osteosclerosis throughout the visualized cervical spine as can be  seen with nephropathy. Soft tissues and spinal canal: No prevertebral fluid or swelling. No visible canal hematoma. Disc levels: At C4-5 there is a mild broad-based disc bulge C4-5. Severe right facet arthropathy at C2-3, C3-4, C4-5, C5-6 and C7-T1. Moderate right facet arthropathy at C6-7. Upper chest: Lung apices are clear. Other: No fluid  collection or hematoma. Bilateral carotid artery atherosclerosis. IMPRESSION: 1. No acute intracranial pathology. 2. No acute osseous injury of the cervical spine. 3. Diffuse osteosclerosis throughout the visualized cervical spine as can be seen with nephropathy. Correlate with laboratory values. Electronically Signed   By: Elige Ko M.D.   On: 12/19/2022 13:11    Assessment/Plan 1. Unspecified inflammatory spondylopathy, lumbar region (HCC) ***  2. Right hemiparesis (HCC) ***  3. Major depression in remission (HCC) ***  4. Stenosis of carotid artery, unspecified laterality ***    Family/ staff Communication:   Labs/tests ordered:

## 2023-02-27 NOTE — Progress Notes (Signed)
Location:   [facility] Room Number: 317 [address] Place of Service:  ALF [phone]) Provider:  Earnestine Mealing, MD  Earnestine Mealing, MD  Patient Care Team: Earnestine Mealing, MD as PCP - General (Family Medicine)  Extended Emergency Contact Information Primary Emergency Contact: Clovis Pu Address: [place], [name] [address] [address] Home Phone: [phone] Relation: Daughter Secondary Emergency Contact: Komada,Michael Mobile Phone: [phone] Relation: Other  Code Status:  DNR Goals of care: Advanced Directive information    02/27/2023    2:34 PM  Advanced Directives  Does Patient Have a Medical Advance Directive? Yes  Type of Estate agent of [place];Living will  Does patient want to make changes to medical advance directive? No - Patient declined  Copy of Healthcare Power of Attorney in Chart? Yes - validated most recent copy scanned in chart (See row information)     Chief Complaint  Patient presents with   Acute Visit    New admit to AL    HPI:  Pt is a 82 y.o. female seen today for a routine visit for admission to [facility] history/Medications Hypertension- slightly elevated today. She takes 4 medications DM- she has pre diabetes. Last A1c 6.9 11/2021 Hx of stroke - 2023 on DAPT. No signs of bleeding.  Hx of falls - occurred when she turned around in the kitchen to put something in it and she fell. She has had a few different falls this year including the one in the hospital.  GERD - worse recently. Bending over is when it's worse.    Mobility: Numerous falls. Bathing independent. Does her pill box. Doesn't drive anymore.    Memory - no concerns at this time. MoCA on admission 19/30 per nursing.  She just got back from an ice cream trip. She had banana ice cream. She does exercise and Bible Study.    She is from [place]. Retired from Research officer, political party. She was at [employer] for 17 years. Daughter is nearby in [place]. She has a brother and sister here in [place].  She moved in from the community just last week.     Matters Most: Speech and Ambulation.    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 b.) Doppler 12/30/2014: >70% LICA and 70% RICA. c.) Doppler 10/25/16: >70% Bilater ICAs   Chronic bilateral low back pain with left-sided sciatica    CKD (chronic kidney disease), stage III (HCC)    Coronary artery disease    a.) LHC 12/24/2014 -> small LAD system; diffuse CTO of LAD. 60% RCA. LM and LCx with no sig disease. no intervention; med mgmt.   Degenerative arthritis    Diastolic dysfunction    a.) TTE 09/20/2018: EF 55%, G1DD, mild LAE and RVE, triv-mild panvalvular regurgitation.   Dysarthria    GERD (gastroesophageal reflux disease)    Hepatic steatosis    History of kidney stones    HLD (hyperlipidemia)    Hypertension    Kidney stone    Lumbar radiculopathy    Major depression in remission (HCC)    PVD (peripheral vascular disease) (HCC)    Renal cyst, right 06/02/2015   a.) CT 06/02/2015 --> 4.4 cm RIGHT renal mass; MRI recommended. b.) MRI  06/16/2015 --> 3.8 x 4.2 x 4.0 cm proteinaceous/hemmorhagic cyst.   Subclavian steal syndrome    a.) LHC 12/24/2014 --> tight eccentric 90% ostial stenosis with damping.   T2DM (type 2 diabetes mellitus) [facility])    Past Surgical History:  Procedure Laterality Date   ABDOMINAL HYSTERECTOMY     AUGMENTATION MAMMAPLASTY Bilateral 1980   BACK SURGERY  09/2018   BICEPT TENODESIS  06/10/2021   Procedure: BICEPS TENODESIS;  Surgeon: Christena Flake, MD;  Location: [facility] ORS;  Service: Orthopedics;;   CARDIAC CATHETERIZATION Left 12/24/2014   Procedure: LEFT HEARD CATHETERIZATION; Location: [facility]; Surgeon: Ander Purpura, MD   CHOLECYSTECTOMY     COLONOSCOPY  N/A 02/11/2016   Procedure: COLONOSCOPY;  Surgeon: Scot Jun, MD;  Location[facility] ENDOSCOPY;  Service: Endoscopy;  Laterality: N/A;   IR CT HEAD LTD  11/16/2021   IR CT HEAD LTD  11/16/2021   IR INTRAVSC STENT CERV CAROTID W/O EMB-PROT MOD SED INC ANGIO  11/18/2021   IR PERCUTANEOUS ART THROMBECTOMY/INFUSION INTRACRANIAL INC DIAG ANGIO  11/16/2021   RADIOLOGY WITH ANESTHESIA N/A 11/16/2021   Procedure: IR WITH ANESTHESIA- CODE STROKE;  Surgeon: Radiologist, Medication, MD;  Location: [facility] OR;  Service: Radiology;  Laterality: N/A;   REVERSE SHOULDER ARTHROPLASTY Right 06/10/2021   Procedure: REVERSE SHOULDER ARTHROPLASTY;  Surgeon: Christena Flake, MD;  Location: [facility] ORS;  Service: Orthopedics;  Laterality: Right;    Allergies  Allergen Reactions   Sulfa Antibiotics Other (See Comments)    Allergies as of 02/27/2023       Reactions   Sulfa Antibiotics Other (See Comments)        Medication List        Accurate as of February 27, 2023  3:44 PM. If you have any questions, ask your nurse or doctor.          STOP taking these medications    ticagrelor 90 MG Tabs tablet Commonly known as: BRILINTA Stopped by: [place] Aparna Vanderweele       TAKE these medications    acetaminophen 325 MG tablet Commonly known as: TYLENOL Take 650 mg by mouth every 4 (four) hours as needed for moderate pain or mild pain (Back).   aspirin 81 MG chewable tablet Chew 1 tablet (81 mg total) by mouth daily.   atorvastatin 80 MG tablet Commonly known as: LIPITOR Take 80 mg by mouth daily.   bismuth subsalicylate 262 MG/15ML suspension Commonly known as: PEPTO BISMOL Take 10 mLs by mouth as needed.   carvedilol 3.125 MG tablet Commonly known as: COREG Take 3.125 mg by mouth 2 (two) times daily.   cetirizine 5 MG tablet Commonly known as: ZYRTEC Take 5 mg by mouth daily as needed for allergies.   chlorthalidone 25 MG tablet Commonly known as: HYGROTON Take 25 mg by mouth daily.   DEBROX  OT Place 5 drops into both ears as needed.   docusate sodium 100 MG capsule Commonly known as: COLACE Take 100 mg by mouth 2 (two) times daily. What changed: Another medication with the same name was removed. Continue taking this medication, and follow the directions you see here. Changed by: Earnestine Mealing   escitalopram 20 MG tablet Commonly known as: LEXAPRO Take 20 mg by mouth daily.   ezetimibe 10 MG tablet Commonly known as: ZETIA Take 10 mg by mouth daily.   Trigels-F Forte Caps capsule Generic drug: Fe Fum-Vit C-Vit B12-FA Take 1 capsule by mouth 2 (two) times daily. ATHEROSCLEROTIC HEART DISEASE OF NATIVE CORONARY  ARTERY WITHOUT ANGINA PECTORIS   Fe Fum-Vit C-Vit B12-FA Caps capsule Commonly known as: TRIGELS-F FORTE Take 1 capsule by mouth daily after breakfast.   Glucose 15 GM/32ML Gel Take 1 packet by mouth as needed. For low blood sugar   isosorbide mononitrate 30 MG 24 hr tablet Commonly known as: IMDUR Take 30 mg by mouth daily.   losartan 100 MG tablet Commonly known as: COZAAR Take 100 mg by mouth daily.   magnesium chloride 64 MG Tbec SR tablet Commonly known as: SLOW-MAG Take 1 tablet by mouth daily.   Milk of Magnesia 7.75 % suspension Generic drug: magnesium hydroxide Take 30 mLs by mouth daily as needed for mild constipation.   multivitamin capsule Take 1 capsule by mouth daily.   MYLANTA PO Take by mouth. Give 2 Tbsp by mouth every 4 hours as needed for gas, indigestion, or upset stomach supervised self-administration   nitroGLYCERIN 0.4 MG SL tablet Commonly known as: NITROSTAT Place 0.4 mg under the tongue every 5 (five) minutes as needed for chest pain.   nystatin powder Commonly known as: MYCOSTATIN/NYSTOP Apply 1 Application topically 2 (two) times daily as needed.   ondansetron 4 MG tablet Commonly known as: ZOFRAN Take 4 mg by mouth every 8 (eight) hours as needed for nausea or vomiting.   pantoprazole 40 MG tablet Commonly  known as: PROTONIX Take 40 mg by mouth daily.   Tussin DM 10-100 MG/5ML liquid Generic drug: dextromethorphan-guaiFENesin Take 10 mLs by mouth every 4 (four) hours as needed for cough.        Review of Systems  Immunization History  Administered Date(s) Administered   Moderna Covid-19 Vaccine Bivalent Booster 100yrs & up 05/25/2021   PFIZER Comirnaty(Gray Top)Covid-19 Tri-Sucrose Vaccine 08/15/2019, 09/07/2019   PFIZER(Purple Top)SARS-COV-2 Vaccination 08/15/2019, 09/07/2019   Pertinent  Health Maintenance Due  Topic Date Due   FOOT EXAM  Never done   OPHTHALMOLOGY EXAM  Never done   DEXA SCAN  Never done   HEMOGLOBIN A1C  05/19/2022   INFLUENZA VACCINE  03/09/2023      11/23/2021   11:00 AM 11/23/2021    8:00 PM 11/24/2021    9:00 AM 11/24/2021    8:10 PM 11/25/2021    8:00 AM  Fall Risk  (RETIRED) Patient Fall Risk Level Moderate fall risk Moderate fall risk Moderate fall risk Moderate fall risk Moderate fall risk   Functional Status Survey:    Vitals:   02/27/23 1431  BP: (!) 141/61  Pulse: 74  Resp: 18  Temp: 97.8 F (36.6 C)  SpO2: 94%  Weight: 149 lb 9.6 oz (67.9 kg)  Height: 5\' 3"  (1.6 m)   Body mass index is 26.5 kg/m. Physical Exam Constitutional:      Appearance: Normal appearance.  HENT:     Head: Normocephalic and atraumatic.  Eyes:     Pupils: Pupils are equal, round, and reactive to light.  Cardiovascular:     Rate and Rhythm: Normal rate and regular rhythm.     Pulses: Normal pulses.  Pulmonary:     Effort: Pulmonary effort is normal.     Breath sounds: Normal breath sounds.  Abdominal:     General: Abdomen is flat.     Palpations: Abdomen is soft.  Musculoskeletal:     Comments: Right sided weakness. Right ankle/calf brace.   Skin:    General: Skin is warm and dry.     Comments: Feet without wounds  Neurological:     Mental Status: She  is alert and oriented to person, place, and time.  Psychiatric:        Behavior: Behavior  normal.     Labs reviewed: Recent Labs    12/19/22 1325 12/19/22 2251 12/20/22 0450 12/21/22 0518  NA 138  --  139 139  K 3.1*  --  3.2* 3.8  CL 107  --  109 110  CO2 19*  --  23 23  GLUCOSE 115*  --  111* 114*  BUN 26*  --  29* 26*  CREATININE 1.14*  --  1.28* 1.15*  CALCIUM 8.2*  --  7.8* 7.8*  MG  --  1.4*  --   --    Recent Labs    12/20/22 0450  AST 17  ALT 15  ALKPHOS 49  BILITOT 0.8  PROT 6.1*  ALBUMIN 3.3*   Recent Labs    12/19/22 1325 12/19/22 2251 12/20/22 0450 12/21/22 0518  WBC 9.6  --  11.0* 9.5  NEUTROABS 7.0  --   --   --   HGB 11.4* 9.9* 9.7* 8.6*  HCT 34.3* 29.7* 30.9* 26.4*  MCV 91.7  --  96.3 93.0  PLT 241  --  197 210   No results found for: "TSH" Lab Results  Component Value Date   HGBA1C 6.5 (H) 11/17/2021   Lab Results  Component Value Date   CHOL 99 11/17/2021   HDL 47 11/17/2021   LDLCALC 20 11/17/2021   TRIG 159 (H) 11/17/2021   CHOLHDL 2.1 11/17/2021    Significant Diagnostic Results in last 30 days:  No results found.  Assessment/Plan Unspecified inflammatory spondylopathy, lumbar region (HCC)  Right hemiparesis (HCC), Chronic  Major depression in remission (HCC), Chronic  Stenosis of carotid artery, unspecified laterality, Chronic - Plan: ticagrelor (BRILINTA) 90 MG TABS tablet, Full code  Chronic bilateral low back pain with left-sided sciatica  Benign hypertension with CKD (chronic kidney disease) stage III (HCC)  History of stroke  Hypercholesteremia  Fall, subsequent encounter 1. Unspecified inflammatory spondylopathy, lumbar region [facility]) S/p fusion, no longer has pain. Continue to monitor.   2. Right hemiparesis (HCC) Secondary to stroke. She is ambulatory with a rollator, however, continues to have balance issues. She has had multiple falls this year. Will plan for PT/OT at this time.   3. Major depression in remission (HCC) Improved, no symptoms. Desires discontinuation. Will plan for dose  reduction then discontinuation. Decrease to Lexapro 10 mg daily for 2 weeks then every other day for 2 weeks and stop.   4. Stenosis of carotid artery, unspecified laterality Denies Symptoms, continue Atorvastatin 80 mg, ASA, and Brilinta. Denies signs of bleeding.    5.  Benign hypertension with CKD (chronic kidney disease) stage III (HCC) BP Slightly above goal today. Will continue with weekly BP checks and adjust as needed. At this time, continue losartan 100 mg, carvedilol 3.125 mg BID, Chlortalidone 25 mg, and imdur 30 mg. Will plan for increase of carvedilol if no improvement.   6. History of stroke Right sided hemiparesis secondary to stroke 1 year ago. Dysarthria as well. PT/OT/ST for supportive care. Continue DAPT at this time, no signs of bleeding. Continue high dose statin as well. Admitting to Assisted living for additional support. Receives help getting on her right lower leg brace. Admission MoCA was 19/30. Patient will start to receive a pill pack as well.   7. Hypercholesteremia Lipids collected 11/2022. At goal level.   8. Fall, subsequent encounter Hx of multiple falls. PT/OT evaluation  Medications are unlikely to contribute at this time.   9. Anemia, unspecified Noted during admission 5/20 after hematoma was stable. Will reassess anemia at this time. Collect additional labs with Iron panel Vitamin B12 as we..     Family/ staff Communication: Nursing  Labs/tests ordered:   CBC, BMP, A1c, Iron panel, Vitamin B12, Vitamin D.

## 2023-03-02 LAB — IRON,TIBC AND FERRITIN PANEL
%SAT: 24
Ferritin: 29
Iron: 72
TIBC: 306

## 2023-03-02 LAB — CBC AND DIFFERENTIAL
HCT: 40 (ref 36–46)
Hemoglobin: 13 (ref 12.0–16.0)
Neutrophils Absolute: 4992
Platelets: 249 10*3/uL (ref 150–400)
WBC: 8

## 2023-03-02 LAB — HEMOGLOBIN A1C: Hemoglobin A1C: 6.2

## 2023-03-02 LAB — VITAMIN B12: Vitamin B-12: 748

## 2023-03-02 LAB — CBC: RBC: 4.17 (ref 3.87–5.11)

## 2023-03-02 LAB — VITAMIN D 25 HYDROXY (VIT D DEFICIENCY, FRACTURES): Vit D, 25-Hydroxy: 25

## 2023-03-12 ENCOUNTER — Other Ambulatory Visit: Payer: Self-pay | Admitting: Student

## 2023-03-12 DIAGNOSIS — E559 Vitamin D deficiency, unspecified: Secondary | ICD-10-CM

## 2023-03-12 MED ORDER — VITAMIN D (ERGOCALCIFEROL) 1.25 MG (50000 UNIT) PO CAPS
50000.0000 [IU] | ORAL_CAPSULE | ORAL | 0 refills | Status: DC
Start: 2023-03-12 — End: 2024-01-02

## 2023-03-12 NOTE — Progress Notes (Signed)
Her urine grew some bacteria, but her white blood cell count is normal. Would encourage good hygiene (clean from front to back in stead of back to front) and encourage adequate hydration.   Iron levels are normal.   Hgb back to normal at 13.   Vitamin B12 within normal range  Vitamin D is low, would recommend supplementation with Vitamin D 50,000 IU weekly for 12 weeks then maintenance dose with 2000 IU.   A1c is slightly elevated at 6.2 in the pre-DM range, would recommend repeating q6 mo. No medication changes at this time.

## 2023-03-13 ENCOUNTER — Emergency Department (HOSPITAL_COMMUNITY): Payer: Medicare Other

## 2023-03-13 ENCOUNTER — Other Ambulatory Visit: Payer: Self-pay

## 2023-03-13 ENCOUNTER — Observation Stay (HOSPITAL_COMMUNITY): Payer: Medicare Other

## 2023-03-13 ENCOUNTER — Observation Stay (HOSPITAL_COMMUNITY)
Admission: EM | Admit: 2023-03-13 | Discharge: 2023-03-16 | Disposition: A | Discharge status: Home / Self Care | Payer: Medicare Other | Attending: Internal Medicine | Admitting: Internal Medicine

## 2023-03-13 DIAGNOSIS — I6502 Occlusion and stenosis of left vertebral artery: Secondary | ICD-10-CM | POA: Diagnosis not present

## 2023-03-13 DIAGNOSIS — I2510 Atherosclerotic heart disease of native coronary artery without angina pectoris: Secondary | ICD-10-CM | POA: Diagnosis not present

## 2023-03-13 DIAGNOSIS — G9389 Other specified disorders of brain: Secondary | ICD-10-CM | POA: Insufficient documentation

## 2023-03-13 DIAGNOSIS — R2689 Other abnormalities of gait and mobility: Secondary | ICD-10-CM | POA: Insufficient documentation

## 2023-03-13 DIAGNOSIS — Z8679 Personal history of other diseases of the circulatory system: Secondary | ICD-10-CM | POA: Diagnosis not present

## 2023-03-13 DIAGNOSIS — N183 Chronic kidney disease, stage 3 unspecified: Secondary | ICD-10-CM | POA: Insufficient documentation

## 2023-03-13 DIAGNOSIS — I129 Hypertensive chronic kidney disease with stage 1 through stage 4 chronic kidney disease, or unspecified chronic kidney disease: Secondary | ICD-10-CM | POA: Diagnosis not present

## 2023-03-13 DIAGNOSIS — Z859 Personal history of malignant neoplasm, unspecified: Secondary | ICD-10-CM | POA: Diagnosis not present

## 2023-03-13 DIAGNOSIS — I351 Nonrheumatic aortic (valve) insufficiency: Secondary | ICD-10-CM | POA: Diagnosis not present

## 2023-03-13 DIAGNOSIS — R4781 Slurred speech: Secondary | ICD-10-CM | POA: Diagnosis not present

## 2023-03-13 DIAGNOSIS — E1169 Type 2 diabetes mellitus with other specified complication: Secondary | ICD-10-CM | POA: Diagnosis present

## 2023-03-13 DIAGNOSIS — E1122 Type 2 diabetes mellitus with diabetic chronic kidney disease: Secondary | ICD-10-CM | POA: Diagnosis not present

## 2023-03-13 DIAGNOSIS — R2681 Unsteadiness on feet: Secondary | ICD-10-CM | POA: Diagnosis not present

## 2023-03-13 DIAGNOSIS — E7800 Pure hypercholesterolemia, unspecified: Secondary | ICD-10-CM | POA: Diagnosis present

## 2023-03-13 DIAGNOSIS — Z79899 Other long term (current) drug therapy: Secondary | ICD-10-CM | POA: Insufficient documentation

## 2023-03-13 DIAGNOSIS — K219 Gastro-esophageal reflux disease without esophagitis: Secondary | ICD-10-CM | POA: Diagnosis present

## 2023-03-13 DIAGNOSIS — R531 Weakness: Secondary | ICD-10-CM | POA: Diagnosis present

## 2023-03-13 DIAGNOSIS — I6782 Cerebral ischemia: Secondary | ICD-10-CM | POA: Insufficient documentation

## 2023-03-13 DIAGNOSIS — F325 Major depressive disorder, single episode, in full remission: Secondary | ICD-10-CM | POA: Diagnosis present

## 2023-03-13 DIAGNOSIS — G8191 Hemiplegia, unspecified affecting right dominant side: Secondary | ICD-10-CM | POA: Diagnosis not present

## 2023-03-13 DIAGNOSIS — Z1152 Encounter for screening for COVID-19: Secondary | ICD-10-CM | POA: Diagnosis not present

## 2023-03-13 DIAGNOSIS — E782 Mixed hyperlipidemia: Secondary | ICD-10-CM | POA: Diagnosis not present

## 2023-03-13 DIAGNOSIS — I700 Atherosclerosis of aorta: Secondary | ICD-10-CM | POA: Insufficient documentation

## 2023-03-13 DIAGNOSIS — R471 Dysarthria and anarthria: Secondary | ICD-10-CM | POA: Diagnosis present

## 2023-03-13 DIAGNOSIS — Z7982 Long term (current) use of aspirin: Secondary | ICD-10-CM | POA: Insufficient documentation

## 2023-03-13 LAB — COMPREHENSIVE METABOLIC PANEL
ALT: 26 U/L (ref 0–44)
AST: 29 U/L (ref 15–41)
Albumin: 3.6 g/dL (ref 3.5–5.0)
Alkaline Phosphatase: 51 U/L (ref 38–126)
Anion gap: 11 (ref 5–15)
BUN: 15 mg/dL (ref 8–23)
CO2: 26 mmol/L (ref 22–32)
Calcium: 9.1 mg/dL (ref 8.9–10.3)
Chloride: 102 mmol/L (ref 98–111)
Creatinine, Ser: 1.07 mg/dL — ABNORMAL HIGH (ref 0.44–1.00)
GFR, Estimated: 51 mL/min — ABNORMAL LOW (ref >60)
Glucose, Bld: 108 mg/dL — ABNORMAL HIGH (ref 70–99)
Potassium: 3.6 mmol/L (ref 3.5–5.1)
Sodium: 139 mmol/L (ref 135–145)
Total Bilirubin: 0.7 mg/dL (ref 0.3–1.2)
Total Protein: 6.9 g/dL (ref 6.5–8.1)

## 2023-03-13 LAB — CBC
HCT: 41.2 % (ref 36.0–46.0)
Hemoglobin: 13.4 g/dL (ref 12.0–15.0)
MCH: 30.8 pg (ref 26.0–34.0)
MCHC: 32.5 g/dL (ref 30.0–36.0)
MCV: 94.7 fL (ref 80.0–100.0)
Platelets: 256 10*3/uL (ref 150–400)
RBC: 4.35 MIL/uL (ref 3.87–5.11)
RDW: 13.7 % (ref 11.5–15.5)
WBC: 7.2 10*3/uL (ref 4.0–10.5)
nRBC: 0 % (ref 0.0–0.2)

## 2023-03-13 LAB — PROTIME-INR
INR: 0.9 (ref 0.8–1.2)
Prothrombin Time: 12.8 seconds (ref 11.4–15.2)

## 2023-03-13 LAB — HEMOGLOBIN A1C
Hgb A1c MFr Bld: 6.4 % — ABNORMAL HIGH (ref 4.8–5.6)
Mean Plasma Glucose: 136.98 mg/dL

## 2023-03-13 LAB — DIFFERENTIAL
Abs Immature Granulocytes: 0.01 10*3/uL (ref 0.00–0.07)
Basophils Absolute: 0.1 10*3/uL (ref 0.0–0.1)
Basophils Relative: 1 %
Eosinophils Absolute: 0.1 10*3/uL (ref 0.0–0.5)
Eosinophils Relative: 2 %
Immature Granulocytes: 0 %
Lymphocytes Relative: 24 %
Lymphs Abs: 1.7 10*3/uL (ref 0.7–4.0)
Monocytes Absolute: 0.6 10*3/uL (ref 0.1–1.0)
Monocytes Relative: 8 %
Neutro Abs: 4.6 10*3/uL (ref 1.7–7.7)
Neutrophils Relative %: 65 %

## 2023-03-13 LAB — I-STAT CHEM 8, ED
BUN: 16 mg/dL (ref 8–23)
Calcium, Ion: 1.12 mmol/L — ABNORMAL LOW (ref 1.15–1.40)
Chloride: 104 mmol/L (ref 98–111)
Creatinine, Ser: 1.1 mg/dL — ABNORMAL HIGH (ref 0.44–1.00)
Glucose, Bld: 103 mg/dL — ABNORMAL HIGH (ref 70–99)
HCT: 41 % (ref 36.0–46.0)
Hemoglobin: 13.9 g/dL (ref 12.0–15.0)
Potassium: 3.5 mmol/L (ref 3.5–5.1)
Sodium: 140 mmol/L (ref 135–145)
TCO2: 24 mmol/L (ref 22–32)

## 2023-03-13 LAB — RAPID URINE DRUG SCREEN, HOSP PERFORMED
Amphetamines: NOT DETECTED
Barbiturates: NOT DETECTED
Benzodiazepines: NOT DETECTED
Cocaine: NOT DETECTED
Opiates: NOT DETECTED
Tetrahydrocannabinol: NOT DETECTED

## 2023-03-13 LAB — SARS CORONAVIRUS 2 BY RT PCR: SARS Coronavirus 2 by RT PCR: NEGATIVE

## 2023-03-13 LAB — URINALYSIS, ROUTINE W REFLEX MICROSCOPIC
Bacteria, UA: NONE SEEN
Bilirubin Urine: NEGATIVE
Glucose, UA: NEGATIVE mg/dL
Hgb urine dipstick: NEGATIVE
Ketones, ur: NEGATIVE mg/dL
Leukocytes,Ua: NEGATIVE
Nitrite: NEGATIVE
Protein, ur: 100 mg/dL — AB
Specific Gravity, Urine: 1.013 (ref 1.005–1.030)
pH: 7 (ref 5.0–8.0)

## 2023-03-13 LAB — TROPONIN I (HIGH SENSITIVITY)
Troponin I (High Sensitivity): 6 ng/L (ref <18)
Troponin I (High Sensitivity): 12 ng/L (ref <18)

## 2023-03-13 LAB — CBG MONITORING, ED
Glucose-Capillary: 107 mg/dL — ABNORMAL HIGH (ref 70–99)
Glucose-Capillary: 116 mg/dL — ABNORMAL HIGH (ref 70–99)

## 2023-03-13 LAB — ETHANOL: Alcohol, Ethyl (B): <10 mg/dL (ref <10)

## 2023-03-13 LAB — GLUCOSE, CAPILLARY: Glucose-Capillary: 122 mg/dL — ABNORMAL HIGH (ref 70–99)

## 2023-03-13 LAB — APTT: aPTT: 20 seconds — ABNORMAL LOW (ref 24–36)

## 2023-03-13 MED ORDER — LABETALOL HCL 5 MG/ML IV SOLN
20.0000 mg | Freq: Once | INTRAVENOUS | Status: AC
  Administered 2023-03-13: 20 mg via INTRAVENOUS
  Filled 2023-03-13 (×2): qty 4

## 2023-03-13 MED ORDER — ACETAMINOPHEN 650 MG RE SUPP: 650.0000 mg | Freq: Four times a day (QID) | RECTAL | Status: DC | PRN

## 2023-03-13 MED ORDER — ASPIRIN 325 MG PO TABS
325.0000 mg | ORAL_TABLET | Freq: Every day | ORAL | Status: DC
  Administered 2023-03-13: 325 mg via ORAL
  Filled 2023-03-13: qty 1

## 2023-03-13 MED ORDER — ONDANSETRON HCL 4 MG PO TABS: 4.0000 mg | ORAL_TABLET | Freq: Four times a day (QID) | ORAL | Status: DC | PRN

## 2023-03-13 MED ORDER — HYDRALAZINE HCL 20 MG/ML IJ SOLN
5.0000 mg | Freq: Once | INTRAMUSCULAR | Status: AC
  Administered 2023-03-13: 5 mg via INTRAVENOUS
  Filled 2023-03-13: qty 1

## 2023-03-13 MED ORDER — INSULIN ASPART 100 UNIT/ML IJ SOLN
0.0000 [IU] | Freq: Three times a day (TID) | INTRAMUSCULAR | Status: DC
  Administered 2023-03-15 (×2): 2 [IU] via SUBCUTANEOUS

## 2023-03-13 MED ORDER — DOCUSATE SODIUM 100 MG PO CAPS
100.0000 mg | ORAL_CAPSULE | Freq: Two times a day (BID) | ORAL | Status: DC
  Administered 2023-03-13 – 2023-03-16 (×6): 100 mg via ORAL
  Filled 2023-03-13 (×6): qty 1

## 2023-03-13 MED ORDER — PANTOPRAZOLE SODIUM 40 MG PO TBEC
40.0000 mg | DELAYED_RELEASE_TABLET | Freq: Every day | ORAL | Status: DC
  Administered 2023-03-13 – 2023-03-16 (×3): 40 mg via ORAL
  Filled 2023-03-13 (×3): qty 1

## 2023-03-13 MED ORDER — LABETALOL HCL 5 MG/ML IV SOLN
20.0000 mg | Freq: Once | INTRAVENOUS | Status: AC
  Administered 2023-03-13: 20 mg via INTRAVENOUS
  Filled 2023-03-13: qty 4

## 2023-03-13 MED ORDER — HYDRALAZINE HCL 20 MG/ML IJ SOLN: 10.0000 mg | INTRAMUSCULAR | Status: DC | PRN

## 2023-03-13 MED ORDER — ASPIRIN 81 MG PO TBEC: 81.0000 mg | DELAYED_RELEASE_TABLET | Freq: Every day | ORAL | Status: DC

## 2023-03-13 MED ORDER — ATORVASTATIN CALCIUM 80 MG PO TABS
80.0000 mg | ORAL_TABLET | Freq: Every day | ORAL | Status: DC
  Administered 2023-03-13 – 2023-03-16 (×4): 80 mg via ORAL
  Filled 2023-03-13 (×4): qty 1

## 2023-03-13 MED ORDER — ACETAMINOPHEN 500 MG PO TABS
1000.0000 mg | ORAL_TABLET | Freq: Once | ORAL | Status: AC
  Administered 2023-03-13: 1000 mg via ORAL
  Filled 2023-03-13: qty 2

## 2023-03-13 MED ORDER — IOHEXOL 350 MG/ML SOLN
100.0000 mL | Freq: Once | INTRAVENOUS | Status: AC | PRN
  Administered 2023-03-13: 100 mL via INTRAVENOUS

## 2023-03-13 MED ORDER — MAGNESIUM CHLORIDE 64 MG PO TBEC
1.0000 | DELAYED_RELEASE_TABLET | Freq: Every day | ORAL | Status: DC
  Administered 2023-03-14 – 2023-03-16 (×3): 64 mg via ORAL
  Filled 2023-03-13 (×3): qty 1

## 2023-03-13 MED ORDER — STROKE: EARLY STAGES OF RECOVERY BOOK
Freq: Once | Status: AC
  Filled 2023-03-13: qty 1

## 2023-03-13 MED ORDER — ONDANSETRON HCL 4 MG/2ML IJ SOLN: 4.0000 mg | Freq: Four times a day (QID) | INTRAMUSCULAR | Status: DC | PRN

## 2023-03-13 MED ORDER — TICAGRELOR 90 MG PO TABS: 90.0000 mg | ORAL_TABLET | Freq: Two times a day (BID) | ORAL | Status: DC

## 2023-03-13 MED ORDER — EZETIMIBE 10 MG PO TABS
10.0000 mg | ORAL_TABLET | Freq: Every day | ORAL | Status: DC
  Administered 2023-03-14 – 2023-03-16 (×3): 10 mg via ORAL
  Filled 2023-03-13 (×3): qty 1

## 2023-03-13 MED ORDER — ASPIRIN 81 MG PO TBEC
81.0000 mg | DELAYED_RELEASE_TABLET | Freq: Every day | ORAL | Status: DC
  Administered 2023-03-14 – 2023-03-16 (×3): 81 mg via ORAL
  Filled 2023-03-13 (×3): qty 1

## 2023-03-13 MED ORDER — ACETAMINOPHEN 325 MG PO TABS
650.0000 mg | ORAL_TABLET | Freq: Four times a day (QID) | ORAL | Status: DC | PRN
  Administered 2023-03-13: 650 mg via ORAL
  Filled 2023-03-13 (×2): qty 2

## 2023-03-13 MED ORDER — ASPIRIN 300 MG RE SUPP: 300.0000 mg | Freq: Every day | RECTAL | Status: DC

## 2023-03-13 MED ORDER — ENOXAPARIN SODIUM 40 MG/0.4ML IJ SOSY
40.0000 mg | PREFILLED_SYRINGE | INTRAMUSCULAR | Status: DC
  Administered 2023-03-13 – 2023-03-15 (×3): 40 mg via SUBCUTANEOUS
  Filled 2023-03-13 (×3): qty 0.4

## 2023-03-13 NOTE — Code Documentation (Signed)
Stroke Response Nurse Documentation Code Documentation  Cassidy Hernandez is a 82 y.o. female arriving to [facility]  via [facility] on 03/13/2023 with past medical hx of hypertension, carotid artery stenosis, GERD, diabetes, CAD, stroke s/p left ICA stenting 11/2021 with residual right sided weakness and foot drop, cancer, CKD stage III, hyperlipidemia, dysarthria, PVD, depression diastolic dysfunction, and aortic atherosclerosis. On aspirin 81 mg daily and Brilinta (ticagrelor) 90 mg bid.   Patient from [facility] where she was LKW at 2100 on 03/12/2023 and noted at 0900 03/13/2023 to have mild weakness in right hand, right facial droop and slurred speech. Code stroke was activated by EMS.   Stroke team at the bedside on patient arrival. Labs drawn and patient cleared for CT by Dr. Dalene Seltzer. Patient to CT with team. NIHSS 11, see documentation for details and code stroke times. Patient with disoriented, right hemianopia, right facial droop, right arm weakness, Global aphasia , and dysarthria  on exam. The following imaging was completed:  CT Head, CTA, and CTP. Patient is not a candidate for IV Thrombolytic due to out of window. Patient is not a candidate for IR due to no LVO.   Care Plan: Q2 VS and NIHSS, notify if increase in NIHSS.   Bedside handoff with ED RN.    Ferman Hamming Stroke Response RN

## 2023-03-13 NOTE — ED Notes (Signed)
Pt unable to answer questions appropriately.  MRI will call daughter to obtain history.

## 2023-03-13 NOTE — ED Triage Notes (Signed)
Per EMS last well known was last night at 2100, patient found this morning around 0900 by staff with right sided grip weakness, slurred speech, right sided facial droop, and headache.   Hx: Stroke with mild weakness  BP: 228/92 HR:86 94% RA T: 98.2 CBG: 120  Baseline ambulatory with walker

## 2023-03-13 NOTE — ED Notes (Signed)
ED Provider at bedside. 

## 2023-03-13 NOTE — ED Notes (Signed)
Pharmacy provided phone number for RN at Pt's facility.

## 2023-03-13 NOTE — ED Provider Notes (Signed)
[place] EMERGENCY DEPARTMENT AT [facility] Provider Note  MDM   HPI/ROS:  Cassidy Hernandez is a 82 y.o. female with medical history of hypertension, hyperlipidemia, diabetes, CKD, CAD, and prior left ICA CVA presenting from facility out of concern for strokelike symptoms.  Patient resides at a facility, where staff found her this morning confused with right-sided facial droop and right-sided grip weakness.  Last known normal was last night around 2100.  Patient is alert and oriented but having difficulty answering questions and following commands.  Physical exam is notable for: - Overall well-appearing, no acute distress - Alert and oriented x 4, GCS 14 - Neurologically afocal within confines of limited exam due to inability to fully follow commands.  Specifically, no right-sided weakness or sensory deficits.  Moving all 4 extremities well.  On my initial evaluation, patient is:  -Vital signs notable for profound hypertension with pressure of 235/120.  Pulse in the mid 90s.  Satting well on room air, afebrile.  -Additional history obtained from chart review  Given history, patient was paged out as a code stroke upon initial presentation to the emergency department.  Patient was sent immediately to the CT scanner for code stroke imaging.  Per chart review, she was recently treated for urinary tract infection.  CT imaging resulted with no acute findings.  Given ongoing concern for CVA, MRI ordered.  Upon reassessment, patient complaining of headache and blood pressure noted to be markedly high with systolics in mid 200s.  Multiple doses of labetalol administered, Tylenol administered.  Patient to be admitted to [facility] hospitalist service with neurology following.  Please see inpatient notes for further details.  Disposition:  I discussed the case with [facility] hospitalist who graciously agreed to admit the patient to their service for continued care.   Clinical Impression: No  diagnosis found.  Rx / DC Orders ED Discharge Orders     None       The plan for this patient was discussed with Dr. Dalene Seltzer, who voiced agreement and who oversaw evaluation and treatment of this patient.   Clinical Complexity A medically appropriate history, review of systems, and physical exam was performed.  My independent interpretations of EKG, labs, and radiology are documented in the ED course above.   Click here for ABCD2, HEART and other calculatorsREFRESH Note before signing   Patient's presentation is most consistent with acute presentation with potential threat to life or bodily function.  Medical Decision Making Amount and/or Complexity of Data Reviewed Labs: ordered. Radiology: ordered.  Risk OTC drugs. Prescription drug management. Decision regarding hospitalization.    HPI/ROS      See MDM section for pertinent HPI and ROS. A complete ROS was performed with pertinent positives/negatives noted above.   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 b.) Doppler 12/30/2014: >70% LICA and 70% RICA. c.) Doppler 10/25/16: >70% Bilater ICAs   Chronic bilateral low back pain with left-sided sciatica    CKD (chronic kidney disease), stage III (HCC)    Coronary artery disease    a.) LHC 12/24/2014 -> small LAD system; diffuse CTO of LAD. 60% RCA. LM and LCx with no sig disease. no intervention; med mgmt.   Degenerative arthritis    Diastolic dysfunction    a.) TTE 09/20/2018: EF 55%, G1DD, mild LAE and RVE, triv-mild  panvalvular regurgitation.   Dysarthria    GERD (gastroesophageal reflux disease)    Hepatic steatosis    History of kidney stones    HLD (hyperlipidemia)    Hypertension    Kidney stone    Lumbar radiculopathy    Major depression in remission (HCC)    PVD (peripheral vascular disease) (HCC)     Renal cyst, right 06/02/2015   a.) CT 06/02/2015 --> 4.4 cm RIGHT renal mass; MRI recommended. b.) MRI 06/16/2015 --> 3.8 x 4.2 x 4.0 cm proteinaceous/hemmorhagic cyst.   Subclavian steal syndrome    a.) LHC 12/24/2014 --> tight eccentric 90% ostial stenosis with damping.   T2DM (type 2 diabetes mellitus) [facility])     Past Surgical History:  Procedure Laterality Date   ABDOMINAL HYSTERECTOMY     AUGMENTATION MAMMAPLASTY Bilateral 1980   BACK SURGERY  09/2018   BICEPT TENODESIS  06/10/2021   Procedure: BICEPS TENODESIS;  Surgeon: Christena Flake, MD;  Location: [facility] ORS;  Service: Orthopedics;;   CARDIAC CATHETERIZATION Left 12/24/2014   Procedure: LEFT HEARD CATHETERIZATION; Location: [facility]; Surgeon: Ander Purpura, MD   CHOLECYSTECTOMY     COLONOSCOPY N/A 02/11/2016   Procedure: COLONOSCOPY;  Surgeon: Scot Jun, MD;  Location: [facility] ENDOSCOPY;  Service: Endoscopy;  Laterality: N/A;   IR CT HEAD LTD  11/16/2021   IR CT HEAD LTD  11/16/2021   IR INTRAVSC STENT CERV CAROTID W/O EMB-PROT MOD SED INC ANGIO  11/18/2021   IR PERCUTANEOUS ART THROMBECTOMY/INFUSION INTRACRANIAL INC DIAG ANGIO  11/16/2021   RADIOLOGY WITH ANESTHESIA N/A 11/16/2021   Procedure: IR WITH ANESTHESIA- CODE STROKE;  Surgeon: Radiologist, Medication, MD;  Location: [facility] OR;  Service: Radiology;  Laterality: N/A;   REVERSE SHOULDER ARTHROPLASTY Right 06/10/2021   Procedure: REVERSE SHOULDER ARTHROPLASTY;  Surgeon: Christena Flake, MD;  Location: [facility] ORS;  Service: Orthopedics;  Laterality: Right;      Physical Exam   Vitals:   03/14/23 0417 03/14/23 0442 03/14/23 0756 03/14/23 1217  BP: 109/66  138/75 (!) 146/58  Pulse: 77  80 82  Resp: 16  17 17   Temp: 97.8 F (36.6 C)  97.8 F (36.6 C) 98.6 F (37 C)  TempSrc: Oral  Oral Oral  SpO2: 90% 93% 93% 94%  Height:        Physical Exam Vitals and nursing note reviewed.  Constitutional:      General: She is not in acute distress.    Appearance: She is well-developed.   HENT:     Head: Normocephalic and atraumatic.  Eyes:     Conjunctiva/sclera: Conjunctivae normal.  Cardiovascular:     Rate and Rhythm: Normal rate and regular rhythm.     Heart sounds: No murmur heard. Pulmonary:     Effort: Pulmonary effort is normal. No respiratory distress.     Breath sounds: Normal breath sounds.  Abdominal:     Palpations: Abdomen is soft.     Tenderness: There is no abdominal tenderness.  Musculoskeletal:        General: No swelling.     Cervical back: Neck supple.  Skin:    General: Skin is warm and dry.     Capillary Refill: Capillary refill takes less than 2 seconds.  Neurological:     General: No focal deficit present.     Mental Status: She is alert and oriented to person, place, and time.     Sensory: No sensory deficit.     Motor: No weakness.  Comments: Mild speech difficulty.  ANO x 4 but with some difficulty following commands and answering questions  Psychiatric:        Mood and Affect: Mood normal.    Starleen Arms, MD Department of Emergency Medicine   Please note that this documentation was produced with the assistance of voice-to-text technology and may contain errors.    Dyanne Iha, MD 03/14/23 1303    Alvira Monday, MD 03/22/23 [phone]

## 2023-03-13 NOTE — ED Notes (Signed)
Pt went to MRI.

## 2023-03-13 NOTE — ED Notes (Signed)
Pt reports worsening headache, EDP aware. EDP focused on decreasing BP and if headache is still there we will give meds if headache persists.

## 2023-03-13 NOTE — ED Notes (Signed)
Patient transported to CT 

## 2023-03-13 NOTE — ED Notes (Signed)
MRI reports the Pt has been cleared for imaging but they need to make time for her.

## 2023-03-13 NOTE — H&P (Addendum)
History and Physical    Patient: Cassidy Hernandez EGB:[id number] DOB: 1941/05/28 DOA: 03/13/2023 DOS: the patient was seen and examined on 03/13/2023 PCP: Earnestine Mealing, MD  Patient coming from: Home  Chief Complaint:  Chief Complaint  Patient presents with   Code Stroke   HPI: Cassidy Hernandez is a 82 y.o. female with medical history significant of anginal pain and aortic atherosclerosis, carotid artery stenosis, chronic lower back pain, stage IIIa CKD, CAD, osteoarthritis, grade 1 diastolic dysfunction, dysarthria, GERD, hepatic asteatosis, nephrolithiasis, hyperlipidemia, hypertension, major depression in remission, peripheral vascular disease, right renal cyst, subclavian steal syndrome, type 2 diabetes who was brought to the emergency department from her assisted living facility due to the staff noticing that she was confused with right facial droop and right-sided grip weakness.  LKN was around 2100 last night.  She is able to answer some simple questions, but is dysarthric, confused and unable to fully elaborate.  No headache, abdominal, acute back or chest pain at this time.   Lab work: Her urine analysis showed proteinuria of 100 mg/dL, but was otherwise unremarkable.  UDS negative.  Alcohol level normal.  CBC was normal with a white count of 7.2, hemoglobin 13.4 g/dL platelets 371.  Normal PT, INR and PTT.  CMP showed a glucose of 108 and creatinine 1.07 mg/dL.  The rest of the CMP measurements were normal.  Imaging: CT head code stroke no acute intracranial hemorrhage or evidence of acute large vessel territory infarct.  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  CTA head and neck with no large vessel occlusion or significant stenosis of the head and neck.  Patent left ICA stent.  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  No core infarct or ischemic penumbra on CT perfusion.   ED course: Initial vital signs were temperature  97.4 F, pulse 99, respiration 20, BP 220/94 mmHg O2 sat 92% on room air.  The patient received acetaminophen 1000 mg p.o. x 1, hydralazine 5 mg IVP and labetalol 20 mg IVP x 2.  I added aspirin 300 PR or 325 mg p.o.  Review of Systems: As mentioned in the history of present illness. All other systems reviewed and are negative. 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 b.) Doppler 12/30/2014: >70% LICA and 70% RICA. c.) Doppler 10/25/16: >70% Bilater ICAs   Chronic bilateral low back pain with left-sided sciatica    CKD (chronic kidney disease), stage III (HCC)    Coronary artery disease    a.) LHC 12/24/2014 -> small LAD system; diffuse CTO of LAD. 60% RCA. LM and LCx with no sig disease. no intervention; med mgmt.   Degenerative arthritis    Diastolic dysfunction    a.) TTE 09/20/2018: EF 55%, G1DD, mild LAE and RVE, triv-mild panvalvular regurgitation.   Dysarthria    GERD (gastroesophageal reflux disease)    Hepatic steatosis    History of kidney stones    HLD (hyperlipidemia)    Hypertension    Kidney stone    Lumbar radiculopathy    Major depression in remission (HCC)    PVD (peripheral vascular disease) (HCC)    Renal cyst, right 06/02/2015   a.) CT 06/02/2015 --> 4.4 cm RIGHT renal mass; MRI recommended. b.) MRI 06/16/2015 --> 3.8  x 4.2 x 4.0 cm proteinaceous/hemmorhagic cyst.   Subclavian steal syndrome    a.) LHC 12/24/2014 --> tight eccentric 90% ostial stenosis with damping.   T2DM (type 2 diabetes mellitus) [facility])    Past Surgical History:  Procedure Laterality Date   ABDOMINAL HYSTERECTOMY     AUGMENTATION MAMMAPLASTY Bilateral 1980   BACK SURGERY  09/2018   BICEPT TENODESIS  06/10/2021   Procedure: BICEPS TENODESIS;  Surgeon: Christena Flake, MD;  Location: [facility] ORS;  Service: Orthopedics;;   CARDIAC CATHETERIZATION Left  12/24/2014   Procedure: LEFT HEARD CATHETERIZATION; Location: [facility]; Surgeon: Ander Purpura, MD   CHOLECYSTECTOMY     COLONOSCOPY N/A 02/11/2016   Procedure: COLONOSCOPY;  Surgeon: Scot Jun, MD;  Location: [facility] ENDOSCOPY;  Service: Endoscopy;  Laterality: N/A;   IR CT HEAD LTD  11/16/2021   IR CT HEAD LTD  11/16/2021   IR INTRAVSC STENT CERV CAROTID W/O EMB-PROT MOD SED INC ANGIO  11/18/2021   IR PERCUTANEOUS ART THROMBECTOMY/INFUSION INTRACRANIAL INC DIAG ANGIO  11/16/2021   RADIOLOGY WITH ANESTHESIA N/A 11/16/2021   Procedure: IR WITH ANESTHESIA- CODE STROKE;  Surgeon: Radiologist, Medication, MD;  Location: [facility] OR;  Service: Radiology;  Laterality: N/A;   REVERSE SHOULDER ARTHROPLASTY Right 06/10/2021   Procedure: REVERSE SHOULDER ARTHROPLASTY;  Surgeon: Christena Flake, MD;  Location: [facility] ORS;  Service: Orthopedics;  Laterality: Right;   Social History:  reports that she has never smoked. She has never used smokeless tobacco. She reports that she does not drink alcohol and does not use drugs.  Allergies  Allergen Reactions   Sulfa Antibiotics Other (See Comments)    Family History  Problem Relation Age of Onset   Heart attack Mother    Heart disease Mother    Heart attack Father    Heart disease Father    Breast cancer Neg Hx     Prior to Admission medications   Medication Sig Start Date End Date Taking? Authorizing Provider  acetaminophen (TYLENOL) 325 MG tablet Take 650 mg by mouth every 4 (four) hours as needed (discomfort, elevated temperature).   Yes [provider]  alum & mag hydroxide-simeth (MAALOX/MYLANTA) 200-200-20 MG/5 SUSP Apply 30 mLs topically every 4 (four) hours as needed (gas, indigestion, upset stomach).   Yes [provider]  aspirin 81 MG chewable tablet Chew 1 tablet (81 mg total) by mouth daily. 11/26/21  Yes de Saintclair Halsted, Cortney E, NP  atorvastatin (LIPITOR) 80 MG tablet Take 80 mg by mouth daily. 02/13/22  Yes [provider]   bismuth subsalicylate (KAOPECTATE) 262 MG/15ML suspension Take 10 mLs by mouth as needed for diarrhea or loose stools.   Yes [provider]  Carbamide Peroxide (DEBROX OT) Place 5 drops into both ears as needed (ear wax).   Yes [provider]  carvedilol (COREG) 3.125 MG tablet Take 3.125 mg by mouth 2 (two) times daily. 10/05/19  Yes [provider]  cetirizine (ZYRTEC) 5 MG tablet Take 5 mg by mouth See admin instructions. "5 mg by mouth as needed for allergies. Give 1 tablet by mouth per day at bedtime. No to exceed 10 days."   Yes [provider]  chlorthalidone (HYGROTON) 25 MG tablet Take 25 mg by mouth daily. 12/30/21  Yes [provider]  dextromethorphan-guaiFENesin (TUSSIN DM) 10-100 MG/5ML liquid Take 10 mLs by mouth every 4 (four) hours as needed for cough.   Yes [provider]  docusate sodium (COLACE) 100 MG capsule Take 100 mg  by mouth 2 (two) times daily.   Yes [provider]  escitalopram (LEXAPRO) 10 MG tablet Take 10 mg by mouth daily.   Yes [provider]  ezetimibe (ZETIA) 10 MG tablet Take 10 mg by mouth daily. 06/23/18  Yes [provider]  Fe Fum-Vit C-Vit B12-FA (TRIGELS-F FORTE) CAPS capsule Take 1 capsule by mouth daily. ATHEROSCLEROTIC HEART DISEASE OF NATIVE CORONARY ARTERY WITHOUT ANGINA PECTORIS   Yes [provider]  Glucose 15 GM/32ML GEL Take 1 packet by mouth as needed (low blood sugar).   Yes [provider]  isosorbide mononitrate (IMDUR) 30 MG 24 hr tablet Take 30 mg by mouth daily.   Yes [provider]  losartan (COZAAR) 100 MG tablet Take 100 mg by mouth daily.    Yes [provider]  magnesium chloride (SLOW-MAG) 64 MG TBEC SR tablet Take 1 tablet by mouth daily.   Yes [provider]  magnesium hydroxide (MILK OF MAGNESIA) 400 MG/5ML suspension Take 30 mLs by mouth as needed (constipation).   Yes [provider]  Multiple  Vitamin (THEREMS PO) Take 1 tablet by mouth daily.   Yes [provider]  nitroGLYCERIN (NITROSTAT) 0.4 MG SL tablet Place 0.4 mg under the tongue every 5 (five) minutes as needed for chest pain. 04/13/18 09/24/28 Yes [provider]  nystatin (MYCOSTATIN/NYSTOP) powder Apply 1 Application topically 2 (two) times daily as needed (skin breakdown).   Yes [provider]  ondansetron (ZOFRAN) 4 MG tablet Take 4 mg by mouth 3 (three) times daily as needed for nausea.   Yes [provider]  pantoprazole (PROTONIX) 40 MG tablet Take 40 mg by mouth See admin instructions. 2 entries on MAR: 1) 40 mg once daily 2) 40 mg twice daily 01/05/22  Yes [provider]  cephALEXin (KEFLEX) 500 MG capsule 500 mg 4 (four) times daily. 02/10/23   [provider]  ticagrelor (BRILINTA) 90 MG TABS tablet Take 1 tablet (90 mg total) by mouth 2 (two) times daily. Patient not taking: Reported on 03/13/2023 02/27/23   Earnestine Mealing, MD  Vitamin D, Ergocalciferol, (DRISDOL) 1.25 MG (50000 UNIT) CAPS capsule Take 1 capsule (50,000 Units total) by mouth every 7 (seven) days. Patient not taking: Reported on 03/13/2023 03/12/23   Earnestine Mealing, MD    Physical Exam: Vitals:   03/13/23 1400 03/13/23 1415 03/13/23 1425 03/13/23 1430  BP: (!) 208/82 (!) 213/163 (!) 205/83 (!) 206/83  Pulse: 78 84  84  Resp: 17 17 (!) 22 20  Temp:      TempSrc:      SpO2: 99% 96%  97%  Height:       Physical Exam Vitals and nursing note reviewed.  Constitutional:      General: She is awake. She is not in acute distress.    Appearance: Normal appearance.  HENT:     Head: Normocephalic.     Mouth/Throat:     Mouth: Mucous membranes are moist.  Eyes:     General: No scleral icterus.    Pupils: Pupils are equal, round, and reactive to light.  Cardiovascular:     Rate and Rhythm: Normal rate and regular rhythm.     Heart sounds: S1 normal and S2 normal.  Pulmonary:     Effort: Pulmonary  effort is normal.     Breath sounds: Normal breath sounds.  Abdominal:     Palpations: Abdomen is soft.     Tenderness: There is no abdominal tenderness.  Musculoskeletal:     Cervical back: Neck supple.     Right lower leg: No edema.     Left lower leg: No edema.  Skin:    General: Skin is warm and dry.  Neurological:     Mental Status: She is alert and oriented to person, place, and time.  Psychiatric:        Mood and Affect: Mood normal.        Behavior: Behavior normal. Behavior is cooperative.     Data Reviewed:  Results are pending, will review when available.  Assessment and Plan: Principal Problem:   Acute right-sided weakness With exacerbation or recurrence of   Dysarthria/right hemiparesis (HCC) Observation/PCU. Frequent neurochecks. Consult PT and OT. Check fasting lipids. Check hemoglobin A1c. Check echocardiogram. Check MRI of brain. Risk factors modifications. Continue atorvastatin 80 mg daily. Continue ASA 81 mg daily. Stroke team input appreciated.  Active Problems:   CAD (coronary artery disease) Hold beta-blocker for now. Continue aspirin and statin.    Controlled type 2 diabetes mellitus with stage 3 chronic kidney disease,    without long-term current use of insulin (HCC) Carbohydrate modified diet. CBG monitoring with RI SS. Check hemoglobin A1c.    Gastroesophageal reflux disease without esophagitis Continue pantoprazole 40 mg p.o. daily.    Hypercholesteremia Continue atorvastatin 80 mg p.o. daily.    Major depression in remission (HCC) Continue escitalopram 10 mg p.o. daily.    Advance Care Planning:   Code Status: Full Code   Consults: Neuro hospitalist team Dionisio Paschal, MD).  Family Communication:   Severity of Illness: The appropriate patient status for this patient is OBSERVATION. Observation status is judged to be reasonable and necessary in order to provide the required intensity of service to ensure the  patient's safety. The patient's presenting symptoms, physical exam findings, and initial radiographic and laboratory data in the context of their medical condition is felt to place them at decreased risk for further clinical deterioration. Furthermore, it is anticipated that the patient will be medically stable for discharge from the hospital within 2 midnights of admission.   Author: Bobette Mo, MD 03/13/2023 2:45 PM  For on call review [email or web address].   This document was prepared using Dragon voice recognition software and may contain some unintended transcription errors.

## 2023-03-13 NOTE — ED Notes (Signed)
ED TO INPATIENT HANDOFF REPORT  ED Nurse Name and Phone #: Darral Dash RN [phone]  S Name/Age/Gender Cassidy Hernandez 82 y.o. female Room/Bed: 016C/016C  Code Status   Code Status: Full Code  Home/SNF/Other Assisted Living  Patient oriented to: self Is this baseline? No   Triage Complete: Triage complete  Chief Complaint Acute right-sided weakness [R53.1]  Triage Note Per EMS last well known was last night at 2100, patient found this morning around 0900 by staff with right sided grip weakness, slurred speech, right sided facial droop, and headache.   Hx: Stroke with mild weakness  BP: 228/92 HR:86 94% RA T: 98.2 CBG: 120  Baseline ambulatory with walker    Allergies Allergies  Allergen Reactions   Sulfa Antibiotics Other (See Comments)    Unknown reaction    Level of Care/Admitting Diagnosis ED Disposition     ED Disposition  Admit   Condition  --   Comment  Hospital Area: [facility] [[id number]]  Level of Care: Telemetry Medical [104]  May place patient in observation at [facility] or [facility] if equivalent level of care is available:: No  Covid Evaluation: Asymptomatic - no recent exposure (last 10 days) testing not required  Diagnosis: Acute right-sided weakness [360759]  Admitting Physician: Bobette Mo [[id number]]  Attending Physician: Bobette Mo [[id number]]          B Medical/Surgery History Past Medical History:  Diagnosis Date   Anginal pain (HCC)    Aortic atherosclerosis    Cancer (HCC)    Carotid artery stenosis 02/01/2010   a.) Doppler 02/01/2010: 72% LICA and 60% RICA. b.) Doppler 12/30/2014: >70% LICA and 70% RICA. c.) Doppler 10/25/16: >70% Bilater ICAs   Chronic bilateral low back pain with left-sided sciatica    CKD (chronic kidney disease), stage III (HCC)    Coronary artery disease    a.) LHC 12/24/2014 -> small LAD system; diffuse CTO of LAD. 60% RCA. LM and LCx with no sig disease. no intervention;  med mgmt.   Degenerative arthritis    Diastolic dysfunction    a.) TTE 09/20/2018: EF 55%, G1DD, mild LAE and RVE, triv-mild panvalvular regurgitation.   Dysarthria    GERD (gastroesophageal reflux disease)    Hepatic steatosis    History of kidney stones    HLD (hyperlipidemia)    Hypertension    Kidney stone    Lumbar radiculopathy    Major depression in remission (HCC)    PVD (peripheral vascular disease) (HCC)    Renal cyst, right 06/02/2015   a.) CT 06/02/2015 --> 4.4 cm RIGHT renal mass; MRI recommended. b.) MRI 06/16/2015 --> 3.8 x 4.2 x 4.0 cm proteinaceous/hemmorhagic cyst.   Subclavian steal syndrome    a.) LHC 12/24/2014 --> tight eccentric 90% ostial stenosis with damping.   T2DM (type 2 diabetes mellitus) [facility])    Past Surgical History:  Procedure Laterality Date   ABDOMINAL HYSTERECTOMY     AUGMENTATION MAMMAPLASTY Bilateral 1980   BACK SURGERY  09/2018   BICEPT TENODESIS  06/10/2021   Procedure: BICEPS TENODESIS;  Surgeon: Christena Flake, MD;  Location: [facility] ORS;  Service: Orthopedics;;   CARDIAC CATHETERIZATION Left 12/24/2014   Procedure: LEFT HEARD CATHETERIZATION; Location: [facility]; Surgeon: Ander Purpura, MD   CHOLECYSTECTOMY     COLONOSCOPY N/A 02/11/2016   Procedure: COLONOSCOPY;  Surgeon: Scot Jun, MD;  Location: [facility] ENDOSCOPY;  Service: Endoscopy;  Laterality: N/A;   IR CT HEAD LTD  11/16/2021  IR CT HEAD LTD  11/16/2021   IR INTRAVSC STENT CERV CAROTID W/O EMB-PROT MOD SED INC ANGIO  11/18/2021   IR PERCUTANEOUS ART THROMBECTOMY/INFUSION INTRACRANIAL INC DIAG ANGIO  11/16/2021   RADIOLOGY WITH ANESTHESIA N/A 11/16/2021   Procedure: IR WITH ANESTHESIA- CODE STROKE;  Surgeon: Radiologist, Medication, MD;  Location: [facility] OR;  Service: Radiology;  Laterality: N/A;   REVERSE SHOULDER ARTHROPLASTY Right 06/10/2021   Procedure: REVERSE SHOULDER ARTHROPLASTY;  Surgeon: Christena Flake, MD;  Location: [facility] ORS;  Service: Orthopedics;  Laterality: Right;     A IV  Location/Drains/Wounds Patient Lines/Drains/Airways Status     Active Line/Drains/Airways     Name Placement date Placement time Site Days   Peripheral IV 03/13/23 20 G Anterior;Right Forearm 03/13/23  1158  Forearm  less than 1            Intake/Output Last 24 hours No intake or output data in the 24 hours ending 03/13/23 1537  Labs/Imaging Results for orders placed or performed during the hospital encounter of 03/13/23 (from the past 48 hour(s))  CBG monitoring, ED     Status: Abnormal   Collection Time: 03/13/23 10:28 AM  Result Value Ref Range   Glucose-Capillary 107 (H) 70 - 99 mg/dL    Comment: Glucose reference range applies only to samples taken after fasting for at least 8 hours.  Ethanol     Status: None   Collection Time: 03/13/23 10:29 AM  Result Value Ref Range   Alcohol, Ethyl (B) <10 <10 mg/dL    Comment: (NOTE) Lowest detectable limit for serum alcohol is 10 mg/dL.  For medical purposes only. Performed at [facility], [address] [place], [name] [address]   Protime-INR     Status: None   Collection Time: 03/13/23 10:29 AM  Result Value Ref Range   Prothrombin Time 12.8 11.4 - 15.2 seconds   INR 0.9 0.8 - 1.2    Comment: (NOTE) INR goal varies based on device and disease states. Performed at [facility], [address], [place], [name] [address]   APTT     Status: Abnormal   Collection Time: 03/13/23 10:29 AM  Result Value Ref Range   aPTT 20 (L) 24 - 36 seconds    Comment: Performed at [facility], [address], [place], [name] [address]  CBC     Status: None   Collection Time: 03/13/23 10:29 AM  Result Value Ref Range   WBC 7.2 4.0 - 10.5 K/uL   RBC 4.35 3.87 - 5.11 MIL/uL   Hemoglobin 13.4 12.0 - 15.0 g/dL   HCT 86.5 78.4 - 69.6 %   MCV 94.7 80.0 - 100.0 fL   MCH 30.8 26.0 - 34.0 pg   MCHC 32.5 30.0 - 36.0 g/dL   RDW 29.5 28.4 - 13.2 %   Platelets 256 150 - 400 K/uL   nRBC 0.0 0.0 - 0.2 %    Comment:  Performed at [facility], [address] [place], [name] [address]  Differential     Status: None   Collection Time: 03/13/23 10:29 AM  Result Value Ref Range   Neutrophils Relative % 65 %   Neutro Abs 4.6 1.7 - 7.7 K/uL   Lymphocytes Relative 24 %   Lymphs Abs 1.7 0.7 - 4.0 K/uL   Monocytes Relative 8 %   Monocytes Absolute 0.6 0.1 - 1.0 K/uL   Eosinophils Relative 2 %   Eosinophils Absolute 0.1  0.0 - 0.5 K/uL   Basophils Relative 1 %   Basophils Absolute 0.1 0.0 - 0.1 K/uL   Immature Granulocytes 0 %   Abs Immature Granulocytes 0.01 0.00 - 0.07 K/uL    Comment: Performed at [facility], [address] [place], [name] [address]  Comprehensive metabolic panel     Status: Abnormal   Collection Time: 03/13/23 10:29 AM  Result Value Ref Range   Sodium 139 135 - 145 mmol/L   Potassium 3.6 3.5 - 5.1 mmol/L   Chloride 102 98 - 111 mmol/L   CO2 26 22 - 32 mmol/L   Glucose, Bld 108 (H) 70 - 99 mg/dL    Comment: Glucose reference range applies only to samples taken after fasting for at least 8 hours.   BUN 15 8 - 23 mg/dL   Creatinine, Ser 1.91 (H) 0.44 - 1.00 mg/dL   Calcium 9.1 8.9 - 47.8 mg/dL   Total Protein 6.9 6.5 - 8.1 g/dL   Albumin 3.6 3.5 - 5.0 g/dL   AST 29 15 - 41 U/L   ALT 26 0 - 44 U/L   Alkaline Phosphatase 51 38 - 126 U/L   Total Bilirubin 0.7 0.3 - 1.2 mg/dL   GFR, Estimated 51 (L) >60 mL/min    Comment: (NOTE) Calculated using the CKD-EPI Creatinine Equation (2021)    Anion gap 11 5 - 15    Comment: Performed at [facility], [address] [place], [name] [address]  Troponin I (High Sensitivity)     Status: None   Collection Time: 03/13/23 10:29 AM  Result Value Ref Range   Troponin I (High Sensitivity) 6 <18 ng/L    Comment: (NOTE) Elevated high sensitivity troponin I (hsTnI) values and significant  changes across serial measurements may suggest ACS but many other  chronic and acute conditions are known to elevate hsTnI results.   Refer to the "Links" section for chest pain algorithms and additional  guidance. Performed at [facility], [address], [place], [name] [address]   I-stat chem 8, ED     Status: Abnormal   Collection Time: 03/13/23 10:34 AM  Result Value Ref Range   Sodium 140 135 - 145 mmol/L   Potassium 3.5 3.5 - 5.1 mmol/L   Chloride 104 98 - 111 mmol/L   BUN 16 8 - 23 mg/dL   Creatinine, Ser 5.78 (H) 0.44 - 1.00 mg/dL   Glucose, Bld 469 (H) 70 - 99 mg/dL    Comment: Glucose reference range applies only to samples taken after fasting for at least 8 hours.   Calcium, Ion 1.12 (L) 1.15 - 1.40 mmol/L   TCO2 24 22 - 32 mmol/L   Hemoglobin 13.9 12.0 - 15.0 g/dL   HCT 62.9 52.8 - 41.3 %  Urine rapid drug screen (hosp performed)     Status: None   Collection Time: 03/13/23 11:12 AM  Result Value Ref Range   Opiates NONE DETECTED NONE DETECTED   Cocaine NONE DETECTED NONE DETECTED   Benzodiazepines NONE DETECTED NONE DETECTED   Amphetamines NONE DETECTED NONE DETECTED   Tetrahydrocannabinol NONE DETECTED NONE DETECTED   Barbiturates NONE DETECTED NONE DETECTED    Comment: (NOTE) DRUG SCREEN FOR MEDICAL PURPOSES ONLY.  IF CONFIRMATION IS NEEDED FOR ANY PURPOSE, NOTIFY LAB WITHIN 5 DAYS.  LOWEST DETECTABLE LIMITS FOR URINE DRUG SCREEN Drug Class  Cutoff (ng/mL) Amphetamine and metabolites    1000 Barbiturate and metabolites    200 Benzodiazepine                 200 Opiates and metabolites        300 Cocaine and metabolites        300 THC                            50 Performed at [facility], [address], [place], [name] [address]   Urinalysis, Routine w reflex microscopic -Urine, Clean Catch     Status: Abnormal   Collection Time: 03/13/23 11:12 AM  Result Value Ref Range   Color, Urine STRAW (A) YELLOW   APPearance CLEAR CLEAR   Specific Gravity, Urine 1.013 1.005 - 1.030   pH 7.0 5.0 - 8.0   Glucose, UA NEGATIVE NEGATIVE mg/dL   Hgb urine  dipstick NEGATIVE NEGATIVE   Bilirubin Urine NEGATIVE NEGATIVE   Ketones, ur NEGATIVE NEGATIVE mg/dL   Protein, ur 191 (A) NEGATIVE mg/dL   Nitrite NEGATIVE NEGATIVE   Leukocytes,Ua NEGATIVE NEGATIVE   RBC / HPF 0-5 0 - 5 RBC/hpf   WBC, UA 0-5 0 - 5 WBC/hpf   Bacteria, UA NONE SEEN NONE SEEN   Squamous Epithelial / HPF 6-10 0 - 5 /HPF   Hyaline Casts, UA PRESENT     Comment: Performed at [facility], [address] [place], [name] [address]   CT ANGIO HEAD NECK W WO CM W PERF (CODE STROKE)  Result Date: 03/13/2023 CLINICAL DATA:  Neuro deficit, acute, stroke suspected. EXAM: CT ANGIOGRAPHY HEAD AND NECK CT PERFUSION BRAIN TECHNIQUE: Multidetector CT imaging of the head and neck was performed using the standard protocol during bolus administration of intravenous contrast. Multiplanar CT image reconstructions and MIPs were obtained to evaluate the vascular anatomy. Carotid stenosis measurements (when applicable) are obtained utilizing NASCET criteria, using the distal internal carotid diameter as the denominator. Multiphase CT imaging of the brain was performed following IV bolus contrast injection. Subsequent parametric perfusion maps were calculated using RAPID software. RADIATION DOSE REDUCTION: This exam was performed according to the departmental dose-optimization program which includes automated exposure control, adjustment of the mA and/or kV according to patient size and/or use of iterative reconstruction technique. CONTRAST:  OMNIPAQUE IOHEXOL 350 MG/ML SOLN COMPARISON:  Head CT 03/13/2023. CTA head/neck and CT brain perfusion 11/16/2021. FINDINGS: CTA NECK FINDINGS Aortic arch: Two-vessel arch configuration with common origin of the right brachiocephalic and left common carotid arteries. Atherosclerotic calcifications of the aortic arch and arch vessel origins. Arch vessel origins are patent. Right carotid system: No evidence of dissection, stenosis (50% or greater) or occlusion.  Densely calcified plaque of the right carotid bulb and proximal right cervical ICA. Left carotid system: Mixed plaque results in mild stenosis of the distal left CCA. Patent left ICA stent. Vertebral arteries: Densely calcified plaque results in moderate stenosis of the left vertebral artery origin. Skeleton: Unremarkable. Other neck: Unremarkable. Upper chest: Unremarkable. Review of the MIP images confirms the above findings CTA HEAD FINDINGS Anterior circulation: Calcified plaque along the carotid siphons without hemodynamically significant stenosis. The proximal ACAs and MCAs are patent without stenosis or aneurysm. Distal branches are symmetric. Posterior circulation: Normal basilar artery. The SCAs, AICAs and PICAs are patent proximally. The PCAs are patent proximally without stenosis or aneurysm. Distal branches are symmetric. Venous sinuses: As permitted by contrast  timing, patent. Anatomic variants: None. Review of the MIP images confirms the above findings CT Brain Perfusion Findings: ASPECTS: 10 CBF (<30%) Volume: 0mL Perfusion (Tmax>6.0s) volume: 0mL Mismatch Volume: 0mL Infarction Location:Not applicable. IMPRESSION: 1. No large vessel occlusion or hemodynamically significant stenosis of the head or neck. 2. Patent left ICA stent. 3. Moderate stenosis of the left vertebral artery origin. 4. No core infarct or ischemic penumbra on CT perfusion. Aortic Atherosclerosis (ICD10-I70.0). Electronically Signed   By: Orvan Falconer M.D.   On: 03/13/2023 11:26   CT HEAD CODE STROKE WO CONTRAST  Result Date: 03/13/2023 CLINICAL DATA:  Code stroke. Neuro deficit, acute, stroke suspected. EXAM: CT HEAD WITHOUT CONTRAST TECHNIQUE: Contiguous axial images were obtained from the base of the skull through the vertex without intravenous contrast. RADIATION DOSE REDUCTION: This exam was performed according to the departmental dose-optimization program which includes automated exposure control, adjustment of the mA  and/or kV according to patient size and/or use of iterative reconstruction technique. COMPARISON:  Head CT 12/19/2022. FINDINGS: Brain: No acute hemorrhage. 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 No new loss of gray-white differentiation. No acute hydrocephalus or extra-axial collection. No mass effect or midline shift. Vascular: No hyperdense vessel or unexpected calcification. Skull: No calvarial fracture or suspicious bone lesion. Skull base is unremarkable. Sinuses/Orbits: No acute finding. Other: None. ASPECTS [facility] Stroke Program Early CT Score) - Ganglionic level infarction (caudate, lentiform nuclei, internal capsule, insula, M1-M3 cortex): 7 - Supraganglionic infarction (M4-M6 cortex): 3 Total score (0-10 with 10 being normal): 10 IMPRESSION: 1. No acute intracranial hemorrhage or evidence of acute large vessel territory infarct. ASPECT score is 10. 2. 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 Code stroke imaging results were communicated on 03/13/2023 at 10:48 am to provider Dr. Amada Jupiter via secure text paging. Electronically Signed   By: Orvan Falconer M.D.   On: 03/13/2023 10:50    Pending Labs Unresulted Labs (From admission, onward)     Start     Ordered   03/14/23 0500  Lipid panel  (Labs)  Tomorrow morning,   R       Comments: Fasting    03/13/23 1522   03/13/23 1522  Hemoglobin A1c  (Labs)  Add-on,   AD       Comments: To assess prior glycemic control    03/13/23 1522   03/13/23 1435  SARS Coronavirus 2 by RT PCR (hospital order, performed in [facility] hospital lab) *cepheid single result test* Anterior Nasal Swab  (Tier 2 - SARS Coronavirus 2 by RT PCR (hospital order, performed in [facility] hospital lab) *cepheid single result test*)  Once,   URGENT        03/13/23 1434            Vitals/Pain Today's Vitals   03/13/23 1500 03/13/23 1515  03/13/23 1522 03/13/23 1530  BP: (!) 183/80 (!) 184/72  (!) 179/70  Pulse: 84 81  83  Resp: 20 18  18   Temp:   (!) 97.5 F (36.4 C)   TempSrc:      SpO2: 97% 97%  96%  Height:        Isolation Precautions Airborne and Contact precautions  Medications Medications  hydrALAZINE (APRESOLINE) injection 10 mg (has no administration in time range)  enoxaparin (LOVENOX) injection 40 mg (has no administration in time range)   stroke: early stages of recovery book (has no  administration in time range)  acetaminophen (TYLENOL) tablet 650 mg (has no administration in time range)    Or  acetaminophen (TYLENOL) suppository 650 mg (has no administration in time range)  ondansetron (ZOFRAN) tablet 4 mg (has no administration in time range)    Or  ondansetron (ZOFRAN) injection 4 mg (has no administration in time range)  aspirin suppository 300 mg (has no administration in time range)    Or  aspirin tablet 325 mg (has no administration in time range)  iohexol (OMNIPAQUE) 350 MG/ML injection 100 mL (100 mLs Intravenous Contrast Given 03/13/23 1107)  labetalol (NORMODYNE) injection 20 mg (20 mg Intravenous Given 03/13/23 1138)  labetalol (NORMODYNE) injection 20 mg (20 mg Intravenous Given 03/13/23 1256)  hydrALAZINE (APRESOLINE) injection 5 mg (5 mg Intravenous Given 03/13/23 1426)  acetaminophen (TYLENOL) tablet 1,000 mg (1,000 mg Oral Given 03/13/23 1520)    Mobility Ambulatory with walker at home.      Focused Assessments    R Recommendations: See Admitting Provider Note  Report given to:   Additional Notes: Nuero check q 2 hours.

## 2023-03-13 NOTE — ED Notes (Signed)
Pt oxygen level dropped to 87-91% RA, pt placed on 2L [place]

## 2023-03-13 NOTE — ED Notes (Signed)
Lab called regarding troponin add on

## 2023-03-13 NOTE — Consult Note (Signed)
Neurology Consultation  Reason for Consult: Code Stroke Referring Physician: Dalene Seltzer  [id number] facial droop  History is obtained from: EMS personnel  HPI: Cassidy Hernandez is a 82 y.o. 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  She presents via EMS from her assisted living facility.  She apparently spoke with her daughter on the phone around 9 PM last night and was in her usual state of health.  This morning she was found by staff confused with a right facial droop and EMS was called.  There is a note from her PCP that states she does have bacteria in her urine.  Additionally she did have a left ICA stent placed in April 2023 after she was seen as a code stroke with a left distal M1 and proximal M2 occlusion with 80% stenosis of the left ICA and she is currently on aspirin and Brilinta.    LKW: 2100 TNK given?: No outside of window Mechanical Thrombectomy? No LVO Premorbid modified Rankin scale (mRS):  2-Slight disability-UNABLE to perform all activities but does not need assistance   ROS: Unable to obtain due to altered mental status.   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 b.) Doppler 12/30/2014: >70% LICA and 70% RICA. c.) Doppler 10/25/16: >70% Bilater ICAs   Chronic bilateral low back pain with left-sided sciatica    CKD (chronic kidney disease), stage III (HCC)    Coronary artery disease    a.) LHC 12/24/2014 -> small LAD system; diffuse CTO of LAD. 60% RCA. LM and LCx with no sig disease. no intervention; med mgmt.   Degenerative arthritis    Diastolic dysfunction    a.) TTE 09/20/2018: EF 55%, G1DD, mild LAE and RVE, triv-mild panvalvular regurgitation.   Dysarthria     GERD (gastroesophageal reflux disease)    Hepatic steatosis    History of kidney stones    HLD (hyperlipidemia)    Hypertension    Kidney stone    Lumbar radiculopathy    Major depression in remission (HCC)    PVD (peripheral vascular disease) (HCC)    Renal cyst, right 06/02/2015   a.) CT 06/02/2015 --> 4.4 cm RIGHT renal mass; MRI recommended. b.) MRI 06/16/2015 --> 3.8 x 4.2 x 4.0 cm proteinaceous/hemmorhagic cyst.   Subclavian steal syndrome    a.) LHC 12/24/2014 --> tight eccentric 90% ostial stenosis with damping.   T2DM (type 2 diabetes mellitus) (HCC)     Family History  Problem Relation Age of Onset   Heart attack Mother    Heart disease Mother    Heart attack Father    Heart disease Father    Breast cancer Neg Hx     Social History:   reports that she has never smoked. She has never used smokeless tobacco. She reports that she does not drink alcohol and does not use drugs.  Medications No current facility-administered medications for this encounter.  Current Outpatient Medications:    acetaminophen (TYLENOL) 325 MG tablet, Take 650 mg by mouth every 4 (four) hours as needed for moderate pain or mild pain (Back)., Disp: , Rfl:    aspirin 81 MG chewable tablet, Chew 1 tablet (81 mg  total) by mouth daily., Disp: 30 tablet, Rfl: 1   atorvastatin (LIPITOR) 80 MG tablet, Take 80 mg by mouth daily., Disp: , Rfl:    bismuth subsalicylate (PEPTO BISMOL) 262 MG/15ML suspension, Take 10 mLs by mouth as needed., Disp: , Rfl:    Calcium & Magnesium Carbonates (MYLANTA PO), Take by mouth. Give 2 Tbsp by mouth every 4 hours as needed for gas, indigestion, or upset stomach supervised self-administration, Disp: , Rfl:    Carbamide Peroxide (DEBROX OT), Place 5 drops into both ears as needed., Disp: , Rfl:    carvedilol (COREG) 3.125 MG tablet, Take 3.125 mg by mouth 2 (two) times daily., Disp: , Rfl:    cetirizine (ZYRTEC) 5 MG tablet, Take 5 mg by mouth daily as needed for  allergies., Disp: , Rfl:    chlorthalidone (HYGROTON) 25 MG tablet, Take 25 mg by mouth daily., Disp: , Rfl:    dextromethorphan-guaiFENesin (TUSSIN DM) 10-100 MG/5ML liquid, Take 10 mLs by mouth every 4 (four) hours as needed for cough., Disp: , Rfl:    docusate sodium (COLACE) 100 MG capsule, Take 100 mg by mouth 2 (two) times daily., Disp: , Rfl:    ezetimibe (ZETIA) 10 MG tablet, Take 10 mg by mouth daily., Disp: , Rfl: 10   Fe Fum-Vit C-Vit B12-FA (TRIGELS-F FORTE) CAPS capsule, Take 1 capsule by mouth daily after breakfast., Disp: 90 capsule, Rfl: 0   Fe Fum-Vit C-Vit B12-FA (TRIGELS-F FORTE) CAPS capsule, Take 1 capsule by mouth 2 (two) times daily. ATHEROSCLEROTIC HEART DISEASE OF NATIVE CORONARY ARTERY WITHOUT ANGINA PECTORIS, Disp: , Rfl:    Glucose 15 GM/32ML GEL, Take 1 packet by mouth as needed. For low blood sugar, Disp: , Rfl:    isosorbide mononitrate (IMDUR) 30 MG 24 hr tablet, Take 30 mg by mouth daily., Disp: , Rfl:    losartan (COZAAR) 100 MG tablet, Take 100 mg by mouth daily. , Disp: , Rfl:    magnesium chloride (SLOW-MAG) 64 MG TBEC SR tablet, Take 1 tablet by mouth daily., Disp: , Rfl:    magnesium hydroxide (MILK OF MAGNESIA) 400 MG/5ML suspension, Take 30 mLs by mouth daily as needed for mild constipation., Disp: , Rfl:    Multiple Vitamin (MULTIVITAMIN) capsule, Take 1 capsule by mouth daily., Disp: , Rfl:    nitroGLYCERIN (NITROSTAT) 0.4 MG SL tablet, Place 0.4 mg under the tongue every 5 (five) minutes as needed for chest pain., Disp: , Rfl:    nystatin (MYCOSTATIN/NYSTOP) powder, Apply 1 Application topically 2 (two) times daily as needed., Disp: , Rfl:    ondansetron (ZOFRAN) 4 MG tablet, Take 4 mg by mouth every 8 (eight) hours as needed for nausea or vomiting., Disp: , Rfl:    pantoprazole (PROTONIX) 40 MG tablet, Take 40 mg by mouth daily., Disp: , Rfl:    ticagrelor (BRILINTA) 90 MG TABS tablet, Take 1 tablet (90 mg total) by mouth 2 (two) times daily., Disp: ,  Rfl:    Vitamin D, Ergocalciferol, (DRISDOL) 1.25 MG (50000 UNIT) CAPS capsule, Take 1 capsule (50,000 Units total) by mouth every 7 (seven) days., Disp: 12 capsule, Rfl: 0    Exam: Current vital signs: There were no vitals taken for this visit. Vital signs in last 24 hours:    GENERAL: Awake, alert in NAD HEENT: - Normocephalic and atraumatic, dry mm, no LN++, no Thyromegally LUNGS - Clear to auscultation bilaterally with no wheezes CV - S1S2 RRR, no m/r/g, equal pulses bilaterally. ABDOMEN - Soft, nontender,  nondistended with normoactive BS Ext: warm, well perfused, intact peripheral pulses, no edema  NEURO:  Mental Status: Awake, alert, not oriented.  States "I don't know" when asked questions.  Language: speech is dysarthric. Unable to follow commands on the right consistently  Cranial Nerves: PERRL, right hemianopia, visual fields full, no facial asymmetry, facial sensation intact, hearing intact, tongue/uvula/soft palate midline, normal sternocleidomastoid and trapezius muscle strength. No evidence of tongue atrophy or fasciculations Motor:  RUE 4/5 LUE 5/5 RLE 4/5 LLE 5/5 Tone: is slightly increased and bulk is normal Sensation- localizes to painful stimuli Coordination: FTN intact bilaterally, no ataxia in BLE. Gait- deferred  NIHSS 1a Level of Conscious.: 0 1b LOC Questions: 2 1c LOC Commands: 0 2 Best Gaze: 0 3 Visual: 2 4 Facial Palsy: 2 5a Motor Arm - left: 0 5b Motor Arm - Right: 2  6a Motor Leg - Left: 0 6b Motor Leg - Right: 0 7 Limb Ataxia: 0 8 Sensory: 0 9 Best Language: 2 10 Dysarthria: 1 11 Extinct. and Inatten.: 0 TOTAL: 11   Labs I have reviewed labs in epic and the results pertinent to this consultation are:  CBC    Component Value Date/Time   WBC 9.5 12/21/2022 0518   RBC 2.84 (L) 12/21/2022 0518   HGB 8.6 (L) 12/21/2022 0518   HGB 15.3 04/19/2014 1227   HCT 26.4 (L) 12/21/2022 0518   HCT 46.4 04/19/2014 1227   PLT 210 12/21/2022  0518   PLT 289 04/19/2014 1227   MCV 93.0 12/21/2022 0518   MCV 93 04/19/2014 1227   MCH 30.3 12/21/2022 0518   MCHC 32.6 12/21/2022 0518   RDW 13.9 12/21/2022 0518   RDW 13.5 04/19/2014 1227   LYMPHSABS 1.5 12/19/2022 1325   LYMPHSABS 1.8 04/19/2014 1227   MONOABS 0.8 12/19/2022 1325   MONOABS 0.8 04/19/2014 1227   EOSABS 0.2 12/19/2022 1325   EOSABS 0.0 04/19/2014 1227   BASOSABS 0.1 12/19/2022 1325   BASOSABS 0.1 04/19/2014 1227    CMP     Component Value Date/Time   NA 139 12/21/2022 0518   NA 136 04/19/2014 1227   K 3.8 12/21/2022 0518   K 3.5 04/19/2014 1227   CL 110 12/21/2022 0518   CL 102 04/19/2014 1227   CO2 23 12/21/2022 0518   CO2 26 04/19/2014 1227   GLUCOSE 114 (H) 12/21/2022 0518   GLUCOSE 131 (H) 04/19/2014 1227   BUN 26 (H) 12/21/2022 0518   BUN 21 (H) 04/19/2014 1227   CREATININE 1.15 (H) 12/21/2022 0518   CREATININE 1.09 04/19/2014 1227   CALCIUM 7.8 (L) 12/21/2022 0518   CALCIUM 8.6 04/19/2014 1227   PROT 6.1 (L) 12/20/2022 0450   PROT 8.1 04/19/2014 1227   ALBUMIN 3.3 (L) 12/20/2022 0450   ALBUMIN 4.0 04/19/2014 1227   AST 17 12/20/2022 0450   AST 26 04/19/2014 1227   ALT 15 12/20/2022 0450   ALT 71 (H) 04/19/2014 1227   ALKPHOS 49 12/20/2022 0450   ALKPHOS 65 04/19/2014 1227   BILITOT 0.8 12/20/2022 0450   BILITOT 0.6 04/19/2014 1227   GFRNONAA 47 (L) 12/21/2022 0518   GFRNONAA 50 (L) 04/19/2014 1227   GFRAA 58 (L) 04/19/2014 1227    Lipid Panel     Component Value Date/Time   CHOL 99 11/17/2021 0205   TRIG 159 (H) 11/17/2021 0205   HDL 47 11/17/2021 0205   CHOLHDL 2.1 11/17/2021 0205   VLDL 32 11/17/2021 0205   LDLCALC 20 11/17/2021 0205  Imaging I have reviewed the images obtained:  CT-head -  No acute intracranial hemorrhage or evidence of acute large vessel territory infarct. ASPECT score is 10. 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   CT Angio Head  and Neck - No LVO Perfusion- no core infarct  MRI examination of the brain - Pending   Assessment:  82 y.o. female presenting with past medical history of carotid stenosis, CAD, PVD, CKD, HTN, HL, DM2,  previous stroke with left ICA stenting (11/2021) on ASA and brilinta presenting with right facial droop and confusion.  She presents via EMS from her assisted living facility.  She apparently spoke with her daughter on the phone around 9 PM last night and was in her usual state of health.  This morning she was found by staff confused with a right facial droop and EMS was called.  There is a note from her PCP that states she does have bacteria in her urine.  Additionally she did have a left ICA stent placed in April 2023 after she was seen as a code stroke with a left distal M1 and proximal M2 occlusion with 80% stenosis of the left ICA and she is currently on aspirin and Brilinta.   Impression: UTI with recrudescence of previous stroke symptoms Acute ischemic infarct  Recommendations: - MRI brain  - Treat UTI if indicated, infectious work up - Stroke work up indicated if MRI positive for acute infarct  Patient seen and examined by NP/APP with MD. MD to update note as needed.   Elmer Picker, DNP, FNP-BC [facility] Pager: [phone]  I have seen the patient reviewed the above note.  Possibilities include recrudescence of previous symptoms versus new acute ischemic stroke.  She does have severely elevated blood pressures, and we can lower these some given a concern for possibly hypertensive encephalopathy.  I would be hesitant to lower it too much, but with her being over 220/120, could consider lowering insulin.  Would assess for other physiological stressors such as urinary tract infection, or other signs of infection.  If MRI is negative, another possibility would be hypertensive encephalopathy causing recrudescence of her previous stroke symptoms, and could consider  treating this as hypertensive urgency.  Will get an MRI, treat underlying physiological stressors if negative.  Ritta Slot, MD [facility] [phone]  If 7pm- 7am, please page neurology on call as listed in AMION.

## 2023-03-14 ENCOUNTER — Observation Stay (HOSPITAL_COMMUNITY): Payer: Medicare Other

## 2023-03-14 ENCOUNTER — Encounter (HOSPITAL_COMMUNITY): Payer: Self-pay | Admitting: Internal Medicine

## 2023-03-14 ENCOUNTER — Observation Stay (HOSPITAL_BASED_OUTPATIENT_CLINIC_OR_DEPARTMENT_OTHER): Payer: Medicare Other

## 2023-03-14 DIAGNOSIS — N183 Chronic kidney disease, stage 3 unspecified: Secondary | ICD-10-CM

## 2023-03-14 DIAGNOSIS — R531 Weakness: Secondary | ICD-10-CM | POA: Diagnosis not present

## 2023-03-14 DIAGNOSIS — R471 Dysarthria and anarthria: Secondary | ICD-10-CM

## 2023-03-14 DIAGNOSIS — K219 Gastro-esophageal reflux disease without esophagitis: Secondary | ICD-10-CM

## 2023-03-14 DIAGNOSIS — I2510 Atherosclerotic heart disease of native coronary artery without angina pectoris: Secondary | ICD-10-CM | POA: Diagnosis not present

## 2023-03-14 DIAGNOSIS — I10 Essential (primary) hypertension: Secondary | ICD-10-CM

## 2023-03-14 DIAGNOSIS — F325 Major depressive disorder, single episode, in full remission: Secondary | ICD-10-CM

## 2023-03-14 DIAGNOSIS — E1122 Type 2 diabetes mellitus with diabetic chronic kidney disease: Secondary | ICD-10-CM

## 2023-03-14 DIAGNOSIS — R4182 Altered mental status, unspecified: Secondary | ICD-10-CM

## 2023-03-14 DIAGNOSIS — E7800 Pure hypercholesterolemia, unspecified: Secondary | ICD-10-CM

## 2023-03-14 LAB — GLUCOSE, CAPILLARY
Glucose-Capillary: 101 mg/dL — ABNORMAL HIGH (ref 70–99)
Glucose-Capillary: 159 mg/dL — ABNORMAL HIGH (ref 70–99)
Glucose-Capillary: 95 mg/dL (ref 70–99)
Glucose-Capillary: 122 mg/dL — ABNORMAL HIGH (ref 70–99)

## 2023-03-14 MED ORDER — LOSARTAN POTASSIUM 50 MG PO TABS
100.0000 mg | ORAL_TABLET | Freq: Every day | ORAL | Status: DC
  Administered 2023-03-14 – 2023-03-16 (×3): 100 mg via ORAL
  Filled 2023-03-14 (×3): qty 2

## 2023-03-14 MED ORDER — ESCITALOPRAM OXALATE 10 MG PO TABS
10.0000 mg | ORAL_TABLET | Freq: Every day | ORAL | Status: DC
  Administered 2023-03-14 – 2023-03-16 (×3): 10 mg via ORAL
  Filled 2023-03-14 (×3): qty 1

## 2023-03-14 MED ORDER — TICAGRELOR 90 MG PO TABS
90.0000 mg | ORAL_TABLET | Freq: Two times a day (BID) | ORAL | Status: DC
  Administered 2023-03-14 – 2023-03-16 (×4): 90 mg via ORAL
  Filled 2023-03-14 (×4): qty 1

## 2023-03-14 MED ORDER — CARVEDILOL 3.125 MG PO TABS
3.1250 mg | ORAL_TABLET | Freq: Two times a day (BID) | ORAL | Status: DC
  Administered 2023-03-14 – 2023-03-16 (×4): 3.125 mg via ORAL
  Filled 2023-03-14 (×4): qty 1

## 2023-03-14 MED ORDER — ISOSORBIDE MONONITRATE ER 30 MG PO TB24
30.0000 mg | ORAL_TABLET | Freq: Every day | ORAL | Status: DC
  Administered 2023-03-14 – 2023-03-16 (×3): 30 mg via ORAL
  Filled 2023-03-14 (×3): qty 1

## 2023-03-14 MED ORDER — LORATADINE 10 MG PO TABS
10.0000 mg | ORAL_TABLET | Freq: Every day | ORAL | Status: DC
  Administered 2023-03-14 – 2023-03-16 (×3): 10 mg via ORAL
  Filled 2023-03-14 (×3): qty 1

## 2023-03-14 NOTE — Evaluation (Signed)
Speech Language Pathology Evaluation Patient Details Name: Cassidy Hernandez MRN: [id number] DOB: 1940/10/22 Today's Date: 03/14/2023 Time: 4098-1191 SLP Time Calculation (min) (ACUTE ONLY): 31 min  Problem List:  Patient Active Problem List   Diagnosis Date Noted   Acute right-sided weakness 03/13/2023   Unspecified inflammatory spondylopathy, lumbar region (HCC) 02/27/2023   Hematoma 12/21/2022   Urinary frequency 12/20/2022   Fall 12/19/2022   Hip hematoma, left 12/19/2022   Hypokalemia 12/19/2022   Benign hypertension with CKD (chronic kidney disease) stage III (HCC) 03/02/2022   CAD (coronary artery disease) 03/02/2022   Hypercholesteremia 03/02/2022   Right hemiparesis (HCC) 12/16/2021   History of stroke 11/16/2021   Middle cerebral artery embolism, 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   Status post reverse arthroplasty of shoulder, right 06/10/2021   Traumatic complete tear of right rotator cuff 02/26/2021   Major depression in remission (HCC) 06/25/2020   Dysarthria 09/20/2018   Gastroesophageal reflux disease without esophagitis 09/20/2018   Lumbar radiculopathy 09/20/2018   Healthcare maintenance 05/02/2018   Controlled type 2 diabetes mellitus with stage 3 chronic kidney disease, without long-term current use of insulin (HCC) 01/30/2018   Chronic bilateral low back pain with left-sided sciatica 12/07/2016   Hepatic steatosis 11/23/2015   Carotid artery disease (HCC) 01/06/2010   Past Medical History:  Past Medical History:  Diagnosis Date   Anginal pain (HCC)    Aortic atherosclerosis    Cancer (HCC)    Carotid artery stenosis 02/01/2010   a.) Doppler 02/01/2010: 72% LICA and 60% RICA. b.) Doppler 12/30/2014: >70% LICA and 70% RICA. c.) Doppler 10/25/16: >70% Bilater ICAs   Chronic bilateral low back pain with left-sided sciatica    CKD (chronic kidney disease), stage III (HCC)    Coronary artery disease    a.) LHC 12/24/2014 -> small LAD system; diffuse CTO of LAD. 60% RCA. LM and LCx  with no sig disease. no intervention; med mgmt.   Degenerative arthritis    Diastolic dysfunction    a.) TTE 09/20/2018: EF 55%, G1DD, mild LAE and RVE, triv-mild panvalvular regurgitation.   Dysarthria    GERD (gastroesophageal reflux disease)    Hepatic steatosis    History of kidney stones    HLD (hyperlipidemia)    Hypertension    Kidney stone    Lumbar radiculopathy    Major depression in remission (HCC)    PVD (peripheral vascular disease) (HCC)    Renal cyst, right 06/02/2015   a.) CT 06/02/2015 --> 4.4 cm RIGHT renal mass; MRI recommended. b.) MRI 06/16/2015 --> 3.8 x 4.2 x 4.0 cm proteinaceous/hemmorhagic cyst.   Subclavian steal syndrome    a.) LHC 12/24/2014 --> tight eccentric 90% ostial stenosis with damping.   T2DM (type 2 diabetes mellitus) (HCC)    Past Surgical History:  Past Surgical History:  Procedure Laterality Date   ABDOMINAL HYSTERECTOMY     AUGMENTATION MAMMAPLASTY Bilateral 1980   BACK SURGERY  09/2018   BICEPT TENODESIS  06/10/2021   Procedure: BICEPS TENODESIS;  Surgeon: Christena Flake, MD;  Location: [facility] ORS;  Service: Orthopedics;;   CARDIAC CATHETERIZATION Left 12/24/2014   Procedure: LEFT HEARD CATHETERIZATION; Location: [facility]; Surgeon: Ander Purpura, MD   CHOLECYSTECTOMY     COLONOSCOPY N/A 02/11/2016   Procedure: COLONOSCOPY;  Surgeon: Scot Jun, MD;  Location: [facility] ENDOSCOPY;  Service: Endoscopy;  Laterality: N/A;   IR CT HEAD LTD  11/16/2021   IR CT HEAD LTD  11/16/2021   IR INTRAVSC STENT CERV CAROTID W/O EMB-PROT MOD SED  INC ANGIO  11/18/2021   IR PERCUTANEOUS ART THROMBECTOMY/INFUSION INTRACRANIAL INC DIAG ANGIO  11/16/2021   RADIOLOGY WITH ANESTHESIA N/A 11/16/2021   Procedure: IR WITH ANESTHESIA- CODE STROKE;  Surgeon: Radiologist, Medication, MD;  Location: [facility] OR;  Service: Radiology;  Laterality: N/A;   REVERSE SHOULDER ARTHROPLASTY Right 06/10/2021   Procedure: REVERSE SHOULDER ARTHROPLASTY;  Surgeon: Christena Flake, MD;  Location: [facility]  ORS;  Service: Orthopedics;  Laterality: Right;   HPI:  Pt is an 82 yo female presenting to ED from ALF 8/5 with AMS, R facial droop, and R sided grip weakness. MRI Brain with no acute abnormality, but with anterior L temporal lobe and L basal ganglia encephalomalacia. Seen by SLP 11/18/21-06/08/22 with expressive and receptive aphasia as well as dysarthria. PMH includes anginal pain and aortic atherosclerosis, carotid artery stenosis, chronic lower back pain, CKD IIIa, CAD, OA, prior CVA (April 2023) with L ICA stenting and residual dysarthria, GERD, HLD, HTN, PVD, T2DM   Assessment / Plan / Recommendation Clinical Impression  Pt reports noticing acute difficulties with congition, although states that she believes her speech and language to be at her baseline after prior CVA in April 2023. She presents with moderate dysarthria which does intermittently reduce intelligibility, although independently uses speech intelligibility strategies. She reports living at an ALF where she receives assistance for mobility, finances, and medication administration. She states that she typically goes down to the dining area and orders meals independently so today SLP observed pt placing her breakfast order, which she completed with Min verbal cues, indicating pt is able to effectively perform some functional cognitive tasks. She scored 8/30 on the SLUMS (a score of 27 or above is considered WFL) characterized by deficits in attention, memory, and problem solving. Pt presented with good awareness of her errors and repeatedly stated that her performance was not "normal". Suspect frequent interruptions throughout the evaluation could have further impacted her performance. Pt may require increased assitance after d/c due to these acute changes in cognition. Recommend continued SLP f/u to address these deficits through functional treatment activities. Will continue to follow.    SLP Assessment  SLP Recommendation/Assessment:  Patient needs continued Speech Lanaguage Pathology Services SLP Visit Diagnosis: Cognitive communication deficit (R41.841)    Recommendations for follow up therapy are one component of a multi-disciplinary discharge planning process, led by the attending physician.  Recommendations may be updated based on patient status, additional functional criteria and insurance authorization.    Follow Up Recommendations  Skilled nursing-short term rehab (<3 hours/day)    Assistance Recommended at Discharge  Frequent or constant Supervision/Assistance  Functional Status Assessment Patient has had a recent decline in their functional status and demonstrates the ability to make significant improvements in function in a reasonable and predictable amount of time.  Frequency and Duration min 2x/week  2 weeks      SLP Evaluation Cognition  Overall Cognitive Status: No family/caregiver present to determine baseline cognitive functioning Arousal/Alertness: Awake/alert Orientation Level: Oriented X4 Attention: Sustained Sustained Attention: Impaired Sustained Attention Impairment: Verbal basic Memory: Impaired Memory Impairment: Storage deficit;Retrieval deficit;Decreased recall of new information Awareness: Appears intact Problem Solving: Impaired Problem Solving Impairment: Verbal basic       Comprehension  Auditory Comprehension Overall Auditory Comprehension: Appears within functional limits for tasks assessed    Expression Expression Primary Mode of Expression: Verbal Verbal Expression Overall Verbal Expression: Impaired at baseline Naming: Impairment Divergent: 25-49% accurate Written Expression Dominant Hand: Right   Oral / Motor  Oral Motor/Sensory  Function Overall Oral Motor/Sensory Function: Within functional limits Motor Speech Overall Motor Speech: Impaired at baseline Respiration: Within functional limits Phonation: Normal Resonance: Within functional limits Articulation:  Impaired Level of Impairment: Conversation Intelligibility: Intelligibility reduced Conversation: 50-74% accurate Motor Planning: Witnin functional limits            Gwynneth Aliment, M.A., CF-SLP Speech Language Pathology, Acute Rehabilitation Services  Secure Chat preferred [phone]  03/14/2023, 11:24 AM

## 2023-03-14 NOTE — Progress Notes (Signed)
EEG complete - results pending 

## 2023-03-14 NOTE — Progress Notes (Signed)
PROGRESS NOTE    Cassidy Hernandez  GEX:[id number] DOB: 29-Apr-1941 DOA: 03/13/2023 PCP: Cassidy Mealing, MD    Brief Narrative:  Cassidy Hernandez is a 82 y.o. female with medical history significant of anginal pain and aortic atherosclerosis, carotid artery stenosis, chronic lower back pain, stage IIIa CKD, CAD, osteoarthritis, grade 1 diastolic dysfunction, dysarthria, GERD, hepatic steatosis, nephrolithiasis, hyperlipidemia, hypertension, major depression in remission, peripheral vascular disease,  subclavian steal syndrome, type 2 diabetes was brought to the emergency department from assisted living facility with confusion and right facial droop and right-sided grip weakness.  In the ED vitals were notable for blood pressure at 220/94.  Urinalysis showed proteinuria.  CBC within normal range.  CMP with creatinine of 1.0.  CT head code stroke no acute intracranial hemorrhage or evidence of acute large vessel territory infarct.  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  CTA head and neck with no large vessel occlusion or significant stenosis of the head and neck.  Patent with left ICA stent.  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  No core infarct or ischemic penumbra on CT perfusion.  Patient was then considered for admission to hospital for further evaluation and treatment.    Assessment and plan.  Dysarthria/right hemiparesis. Seen by code stroke team.  Not a candidate for thrombolytic due to out of window.  CT scan without acute findings.  MRI of the brain without any acute abnormality except for left temporal and basal ganglia encephalomalacia.  Check PT OT, lipid profile with LDL of 41.  hemoglobin A1c of 6.4. 2D echocardiogram with LV ejection fraction of 65 to 70% with diastolic dysfunction.  Bubble study was negative.  Continue Lipitor, aspirin.  Patient has been restarted on Brilinta.  Urine drug screen was negative.  Awaiting  EEG.  CAD  Continue aspirin and statins.  Beta-blocker will be restarted     Controlled type 2 diabetes mellitus with stage 3 chronic kidney disease,   without long-term current use of insulin  Continue sliding scale insulin, Accu-Cheks, diabetic diet.  Not on medications at home.  Diet controlled.    Gastroesophageal reflux disease without esophagitis Continue Protonix.     Hypercholesteremia Continue Lipitor.     Major depression in remission Continue Lexapro.  Hypertension.  Restart beta-blocker and losartan.  Monitor blood pressure.       DVT prophylaxis: enoxaparin (LOVENOX) injection 40 mg Start: 03/13/23 2200   Code Status:     Code Status: Full Code  Disposition: Skilled nursing facility as per PT recommendation.  Status is: Observation  The patient will require care spanning > 2 midnights and should be moved to inpatient because: Need for skilled nursing facility placement, neurological evaluation with EEG.   Family Communication:  I spoke with the patient's daughter on the phone and updated her about the clinical condition of the patient.  She wishes skilled nursing facility placement.  Consultants:  Neurology  Procedures:  EEG  Antimicrobials:  None  Anti-infectives (From admission, onward)    None       Subjective: Today, patient was seen and examined at bedside.  Patient feels okay.  Denies any headache dizziness lightheadedness or weakness.  Objective: Vitals:   03/14/23 0417 03/14/23 0442 03/14/23 0756 03/14/23 1217  BP: 109/66  138/75 (!) 146/58  Pulse: 77  80 82  Resp: 16  17 17   Temp: 97.8 F (36.6 C)  97.8 F (36.6 C) 98.6 F (  37 C)  TempSrc: Oral  Oral Oral  SpO2: 90% 93% 93% 94%  Weight:    68 kg  Height:    5\' 3"  (1.6 m)   No intake or output data in the 24 hours ending 03/14/23 1527 Filed Weights   03/14/23 1217  Weight: 68 kg    Physical Examination: Body mass index is 26.57 kg/m.   General:  Average built, not in  obvious distress, elderly female. HENT:   No scleral pallor or icterus noted. Oral mucosa is moist.  Chest:  Clear breath sounds.   No crackles or wheezes.  CVS: S1 &S2 heard. No murmur.  Regular rate and rhythm. Abdomen: Soft, nontender, nondistended.  Bowel sounds are heard.   Extremities: No cyanosis, clubbing or edema.  Peripheral pulses are palpable. Psych: Alert, awake and oriented, normal mood CNS:  No cranial nerve deficits.  Generalized weakness noted. Skin: Warm and dry.  No rashes noted.  Data Reviewed:   CBC: Recent Labs  Lab 03/13/23 1029 03/13/23 1034  WBC 7.2  --   NEUTROABS 4.6  --   HGB 13.4 13.9  HCT 41.2 41.0  MCV 94.7  --   PLT 256  --     Basic Metabolic Panel: Recent Labs  Lab 03/13/23 1029 03/13/23 1034  NA 139 140  K 3.6 3.5  CL 102 104  CO2 26  --   GLUCOSE 108* 103*  BUN 15 16  CREATININE 1.07* 1.10*  CALCIUM 9.1  --     Liver Function Tests: Recent Labs  Lab 03/13/23 1029  AST 29  ALT 26  ALKPHOS 51  BILITOT 0.7  PROT 6.9  ALBUMIN 3.6     Radiology Studies: ECHOCARDIOGRAM COMPLETE BUBBLE STUDY  Result Date: 03/14/2023    ECHOCARDIOGRAM REPORT   Patient Name:   Cassidy Hernandez Date of Exam: 03/14/2023 Medical Rec #:  [id number]     Height:       63.0 in Accession #:    [id number]    Weight:       149.6 lb Date of Birth:  October 14, 1940     BSA:          1.709 m Patient Age:    82 years      BP:           146/58 mmHg Patient Gender: F             HR:           78 bpm. Exam Location:  Inpatient Procedure: 2D Echo, Cardiac Doppler, Color Doppler and Saline Contrast Bubble            Study Indications:    Stroke  History:        Patient has prior history of Echocardiogram examinations, most                 recent 01/20/2022. CAD, Stroke, Carotid Disease, PAD and CKD;                 Risk Factors:Diabetes, Dyslipidemia and Hypertension.  Sonographer:    Cassidy Hernandez Referring Phys: Cassidy Hernandez, Cassidy Hernandez  Sonographer Comments: Image acquisition  challenging due to patient body habitus. IMPRESSIONS  1. Intracavitary gradient. Peak velocity 1.69 m/s. Peak gradient 11.4 mmHg. Left ventricular ejection fraction, by estimation, is 65 to 70%. The left ventricle has normal function. The left ventricle has no regional wall motion abnormalities. Left ventricular diastolic parameters are consistent with Grade I diastolic dysfunction (impaired relaxation).  2. Right ventricular systolic function is normal. The right ventricular size is normal. There is normal pulmonary artery systolic pressure.  3. The mitral valve is normal in structure. Trivial mitral valve regurgitation. No evidence of mitral stenosis.  4. The aortic valve is tricuspid. Aortic valve regurgitation is mild. No aortic stenosis is present.  5. The inferior vena cava is normal in size with greater than 50% respiratory variability, suggesting right atrial pressure of 3 mmHg.  6. Agitated saline contrast bubble study was negative, with no evidence of any interatrial shunt. FINDINGS  Left Ventricle: Intracavitary gradient. Peak velocity 1.69 m/s. Peak gradient 11.4 mmHg. Left ventricular ejection fraction, by estimation, is 65 to 70%. The left ventricle has normal function. The left ventricle has no regional wall motion abnormalities. The left ventricular internal cavity size was normal in size. There is no left ventricular hypertrophy. Left ventricular diastolic parameters are consistent with Grade I diastolic dysfunction (impaired relaxation). Indeterminate filling pressures. Right Ventricle: The right ventricular size is normal. No increase in right ventricular wall thickness. Right ventricular systolic function is normal. There is normal pulmonary artery systolic pressure. The tricuspid regurgitant velocity is 2.75 m/s, and  with an assumed right atrial pressure of 3 mmHg, the estimated right ventricular systolic pressure is 33.2 mmHg. Left Atrium: Left atrial size was normal in size. Right Atrium:  Right atrial size was normal in size. Pericardium: There is no evidence of pericardial effusion. Mitral Valve: The mitral valve is normal in structure. Mild mitral annular calcification. Trivial mitral valve regurgitation. No evidence of mitral valve stenosis. MV peak gradient, 4.8 mmHg. The mean mitral valve gradient is 2.0 mmHg. Tricuspid Valve: The tricuspid valve is normal in structure. Tricuspid valve regurgitation is trivial. No evidence of tricuspid stenosis. Aortic Valve: The aortic valve is tricuspid. Aortic valve regurgitation is mild. No aortic stenosis is present. Aortic valve mean gradient measures 4.0 mmHg. Aortic valve peak gradient measures 7.4 mmHg. Aortic valve area, by VTI measures 1.33 cm. Pulmonic Valve: The pulmonic valve was normal in structure. Pulmonic valve regurgitation is mild to moderate. No evidence of pulmonic stenosis. Aorta: The aortic root is normal in size and structure. Venous: The inferior vena cava is normal in size with greater than 50% respiratory variability, suggesting right atrial pressure of 3 mmHg. IAS/Shunts: No atrial level shunt detected by color flow Doppler. Agitated saline contrast was given intravenously to evaluate for intracardiac shunting. Agitated saline contrast bubble study was negative, with no evidence of any interatrial shunt.  LEFT VENTRICLE PLAX 2D LVIDd:         3.70 cm     Diastology LVIDs:         2.50 cm     LV e' medial:    5.58 cm/s LV PW:         0.80 cm     LV E/e' medial:  11.5 LV IVS:        0.80 cm     LV e' lateral:   5.81 cm/s LVOT diam:     1.50 cm     LV E/e' lateral: 11.0 LV SV:         35 LV SV Index:   21 LVOT Area:     1.77 cm  LV Volumes (MOD) LV vol d, MOD A4C: 58.4 ml LV vol s, MOD A4C: 19.5 ml LV SV MOD A4C:     58.4 ml RIGHT VENTRICLE RV Basal diam:  2.70 cm RV S prime:  13.70 cm/s TAPSE (M-mode): 2.0 cm LEFT ATRIUM             Index        RIGHT ATRIUM          Index LA diam:        3.60 cm 2.11 cm/m   RA Area:     9.30 cm  LA Vol (A2C):   36.1 ml 21.12 ml/m  RA Volume:   15.70 ml 9.19 ml/m LA Vol (A4C):   34.9 ml 20.42 ml/m LA Biplane Vol: 36.2 ml 21.18 ml/m  AORTIC VALVE AV Area (Vmax):    1.25 cm AV Area (Vmean):   1.22 cm AV Area (VTI):     1.33 cm AV Vmax:           136.00 cm/s AV Vmean:          97.700 cm/s AV VTI:            0.265 m AV Peak Grad:      7.4 mmHg AV Mean Grad:      4.0 mmHg LVOT Vmax:         96.50 cm/s LVOT Vmean:        67.300 cm/s LVOT VTI:          0.200 m LVOT/AV VTI ratio: 0.75  AORTA Ao Root diam: 2.80 cm Ao Asc diam:  3.00 cm MITRAL VALVE                TRICUSPID VALVE MV Area (PHT): 2.43 cm     TR Peak grad:   30.2 mmHg MV Area VTI:   1.51 cm     TR Vmax:        275.00 cm/s MV Peak grad:  4.8 mmHg MV Mean grad:  2.0 mmHg     SHUNTS MV Vmax:       1.10 m/s     Systemic VTI:  0.20 m MV Vmean:      68.0 cm/s    Systemic Diam: 1.50 cm MV Decel Time: 312 msec MV E velocity: 64.10 cm/s MV A velocity: 102.00 cm/s MV E/A ratio:  0.63 Chilton Si MD Electronically signed by Chilton Si MD Signature Date/Time: 03/14/2023/3:26:48 PM    Final    MR BRAIN WO CONTRAST  Result Date: 03/13/2023 CLINICAL DATA:  Acute neurologic deficit EXAM: MRI HEAD WITHOUT CONTRAST TECHNIQUE: Multiplanar, multiecho pulse sequences of the brain and surrounding structures were obtained without intravenous contrast. COMPARISON:  11/17/2021 FINDINGS: Brain: No acute infarct, mass effect or extra-axial collection. No acute or chronic hemorrhage. There is multifocal hyperintense T2-weighted signal within the white matter. Generalized volume loss. Anterior left temporal lobe and left basal ganglia encephalomalacia. The midline structures are normal. Vascular: Major flow voids are preserved. Skull and upper cervical spine: Normal calvarium and skull base. Visualized upper cervical spine and soft tissues are normal. Sinuses/Orbits:No paranasal sinus fluid levels or advanced mucosal thickening. No mastoid or middle ear  effusion. Normal orbits. IMPRESSION: 1. No acute intracranial abnormality. 2. Anterior left temporal lobe and left basal ganglia encephalomalacia. 3. Chronic small vessel ischemia and volume loss. Electronically Signed   By: Deatra Robinson M.D.   On: 03/13/2023 20:21   DG Chest Portable 1 View  Result Date: 03/13/2023 CLINICAL DATA:  ams EXAM: PORTABLE CHEST 1 VIEW COMPARISON:  CXR 11/16/21 FINDINGS: No pleural effusion. No pneumothorax. No focal airspace opacity. Normal cardiac and mediastinal contours. No radiographically apparent displaced rib fractures. Visualized upper abdomen is notable  for surgical clips in the right upper quadrant. Bilateral breast implants. Right-sided shoulder arthroplasty. IMPRESSION: No focal airspace opacity Electronically Signed   By: Lorenza Cambridge M.D.   On: 03/13/2023 15:37   CT ANGIO HEAD NECK W WO CM W PERF (CODE STROKE)  Result Date: 03/13/2023 CLINICAL DATA:  Neuro deficit, acute, stroke suspected. EXAM: CT ANGIOGRAPHY HEAD AND NECK CT PERFUSION BRAIN TECHNIQUE: Multidetector CT imaging of the head and neck was performed using the standard protocol during bolus administration of intravenous contrast. Multiplanar CT image reconstructions and MIPs were obtained to evaluate the vascular anatomy. Carotid stenosis measurements (when applicable) are obtained utilizing NASCET criteria, using the distal internal carotid diameter as the denominator. Multiphase CT imaging of the brain was performed following IV bolus contrast injection. Subsequent parametric perfusion maps were calculated using RAPID software. RADIATION DOSE REDUCTION: This exam was performed according to the departmental dose-optimization program which includes automated exposure control, adjustment of the mA and/or kV according to patient size and/or use of iterative reconstruction technique. CONTRAST:  OMNIPAQUE IOHEXOL 350 MG/ML SOLN COMPARISON:  Head CT 03/13/2023. CTA head/neck and CT brain perfusion  11/16/2021. FINDINGS: CTA NECK FINDINGS Aortic arch: Two-vessel arch configuration with common origin of the right brachiocephalic and left common carotid arteries. Atherosclerotic calcifications of the aortic arch and arch vessel origins. Arch vessel origins are patent. Right carotid system: No evidence of dissection, stenosis (50% or greater) or occlusion. Densely calcified plaque of the right carotid bulb and proximal right cervical ICA. Left carotid system: Mixed plaque results in mild stenosis of the distal left CCA. Patent left ICA stent. Vertebral arteries: Densely calcified plaque results in moderate stenosis of the left vertebral artery origin. Skeleton: Unremarkable. Other neck: Unremarkable. Upper chest: Unremarkable. Review of the MIP images confirms the above findings CTA HEAD FINDINGS Anterior circulation: Calcified plaque along the carotid siphons without hemodynamically significant stenosis. The proximal ACAs and MCAs are patent without stenosis or aneurysm. Distal branches are symmetric. Posterior circulation: Normal basilar artery. The SCAs, AICAs and PICAs are patent proximally. The PCAs are patent proximally without stenosis or aneurysm. Distal branches are symmetric. Venous sinuses: As permitted by contrast timing, patent. Anatomic variants: None. Review of the MIP images confirms the above findings CT Brain Perfusion Findings: ASPECTS: 10 CBF (<30%) Volume: 0mL Perfusion (Tmax>6.0s) volume: 0mL Mismatch Volume: 0mL Infarction Location:Not applicable. IMPRESSION: 1. No large vessel occlusion or hemodynamically significant stenosis of the head or neck. 2. Patent left ICA stent. 3. Moderate stenosis of the left vertebral artery origin. 4. No core infarct or ischemic penumbra on CT perfusion. Aortic Atherosclerosis (ICD10-I70.0). Electronically Signed   By: Orvan Falconer M.D.   On: 03/13/2023 11:26   CT HEAD CODE STROKE WO CONTRAST  Result Date: 03/13/2023 CLINICAL DATA:  Code stroke. Neuro  deficit, acute, stroke suspected. EXAM: CT HEAD WITHOUT CONTRAST TECHNIQUE: Contiguous axial images were obtained from the base of the skull through the vertex without intravenous contrast. RADIATION DOSE REDUCTION: This exam was performed according to the departmental dose-optimization program which includes automated exposure control, adjustment of the mA and/or kV according to patient size and/or use of iterative reconstruction technique. COMPARISON:  Head CT 12/19/2022. FINDINGS: Brain: No acute hemorrhage. 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 No new loss of gray-white differentiation. No acute hydrocephalus or extra-axial collection. No mass effect or midline shift. Vascular: No hyperdense vessel or unexpected calcification. Skull: No calvarial fracture or suspicious bone lesion. Skull  base is unremarkable. Sinuses/Orbits: No acute finding. Other: None. ASPECTS [facility] Stroke Program Early CT Score) - Ganglionic level infarction (caudate, lentiform nuclei, internal capsule, insula, M1-M3 cortex): 7 - Supraganglionic infarction (M4-M6 cortex): 3 Total score (0-10 with 10 being normal): 10 IMPRESSION: 1. No acute intracranial hemorrhage or evidence of acute large vessel territory infarct. ASPECT score is 10. 2. 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 Code stroke imaging results were communicated on 03/13/2023 at 10:48 am to provider Dr. Amada Jupiter via secure text paging. Electronically Signed   By: Orvan Falconer M.D.   On: 03/13/2023 10:50      LOS: 0 days    Joycelyn Das, MD [facility] Available via Epic secure chat 7am-7pm After these hours, please refer to coverage provider listed on [email or web address] 03/14/2023, 3:27 PM

## 2023-03-14 NOTE — [place] FL2 (Signed)
[place] MEDICAID FL2 LEVEL OF CARE FORM     IDENTIFICATION  Patient Name: Cassidy Hernandez Birthdate: 11-27-1940 Sex: female Admission Date (Current Location): 03/13/2023  [facility] and [place] Number:  Producer, television/film/video and Address:  [facility], [address], [place], [name] [address]      Provider Number: [id number]  Attending Physician Name and Address:  Joycelyn Das, MD  Relative Name and Phone Number:  Clovis Pu (Daughter)  [phone] [facility] Phone)    Current Level of Care: Hospital Recommended Level of Care: Skilled Nursing Facility Prior Approval Number:    Date Approved/Denied:   PASRR Number: [id number]  Discharge Plan: SNF    Current Diagnoses: Patient Active Problem List   Diagnosis Date Noted   Acute right-sided weakness 03/13/2023   Unspecified inflammatory spondylopathy, lumbar region (HCC) 02/27/2023   Hematoma 12/21/2022   Urinary frequency 12/20/2022   Fall 12/19/2022   Hip hematoma, left 12/19/2022   Hypokalemia 12/19/2022   Benign hypertension with CKD (chronic kidney disease) stage III (HCC) 03/02/2022   CAD (coronary artery disease) 03/02/2022   Hypercholesteremia 03/02/2022   Right hemiparesis (HCC) 12/16/2021   History of stroke 11/16/2021   Middle cerebral artery embolism, 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   Status post reverse arthroplasty of shoulder, right 06/10/2021   Traumatic complete tear of right rotator cuff 02/26/2021   Major depression in remission (HCC) 06/25/2020   Dysarthria 09/20/2018   Gastroesophageal reflux disease without esophagitis 09/20/2018   Lumbar radiculopathy 09/20/2018   Healthcare maintenance 05/02/2018   Controlled type 2 diabetes mellitus with stage 3 chronic kidney disease, without long-term current use of insulin (HCC) 01/30/2018   Chronic bilateral low back pain with left-sided sciatica 12/07/2016   Hepatic steatosis 11/23/2015   Carotid artery disease (HCC) 01/06/2010    Orientation  RESPIRATION BLADDER Height & Weight     Self, Time, Place, Situation  Normal Continent Weight: 150 lb (68 kg) Height:  5\' 3"  (160 cm)  BEHAVIORAL SYMPTOMS/MOOD NEUROLOGICAL BOWEL NUTRITION STATUS      Continent Diet (see d/c summary)  AMBULATORY STATUS COMMUNICATION OF NEEDS Skin   Extensive Assist Verbally Normal                       Personal Care Assistance Level of Assistance  Bathing, Dressing, Feeding Bathing Assistance: Limited assistance Feeding assistance: Independent Dressing Assistance: Limited assistance     Functional Limitations Info  Sight, Hearing, Speech Sight Info: Adequate Hearing Info: Adequate Speech Info: Impaired    SPECIAL CARE FACTORS FREQUENCY  OT (By licensed OT), PT (By licensed PT)     PT Frequency: 5x/week OT Frequency: 5x/week            Contractures Contractures Info: Not present    Additional Factors Info  Code Status, Allergies Code Status Info: Full code Allergies Info: sulfa antibiotics           Current Medications (03/14/2023):  This is the current hospital active medication list Current Facility-Administered Medications  Medication Dose Route Frequency Provider Last Rate Last Admin   acetaminophen (TYLENOL) tablet 650 mg  650 mg Oral Q6H PRN Bobette Mo, MD   650 mg at 03/13/23 2040   Or   acetaminophen (TYLENOL) suppository 650 mg  650 mg Rectal Q6H PRN Bobette Mo, MD       aspirin EC tablet 81 mg  81 mg Oral Daily Bobette Mo, MD   81 mg at 03/14/23 1248  atorvastatin (LIPITOR) tablet 80 mg  80 mg Oral Daily Bobette Mo, MD   80 mg at 03/14/23 1248   docusate sodium (COLACE) capsule 100 mg  100 mg Oral BID Bobette Mo, MD   100 mg at 03/14/23 1248   enoxaparin (LOVENOX) injection 40 mg  40 mg Subcutaneous Q24H Bobette Mo, MD   40 mg at 03/13/23 2040   ezetimibe (ZETIA) tablet 10 mg  10 mg Oral Daily Bobette Mo, MD   10 mg at 03/14/23 1248   hydrALAZINE  (APRESOLINE) injection 10 mg  10 mg Intravenous Q3H PRN Bobette Mo, MD       insulin aspart (novoLOG) injection 0-15 Units  0-15 Units Subcutaneous TID WC Bobette Mo, MD       magnesium chloride (SLOW-MAG) 64 MG SR tablet 64 mg  1 tablet Oral Daily Bobette Mo, MD   64 mg at 03/14/23 1249   ondansetron (ZOFRAN) tablet 4 mg  4 mg Oral Q6H PRN Bobette Mo, MD       Or   ondansetron [facility]) injection 4 mg  4 mg Intravenous Q6H PRN Bobette Mo, MD       pantoprazole (PROTONIX) EC tablet 40 mg  40 mg Oral Q0600 Bobette Mo, MD   40 mg at 03/13/23 2039     Discharge Medications: Please see discharge summary for a list of discharge medications.  Relevant Imaging Results:  Relevant Lab Results:   Additional Information SS #: [id number]   [address], LCSW

## 2023-03-14 NOTE — Evaluation (Signed)
Physical Therapy Evaluation  Patient Details Name: Cassidy Hernandez MRN: [id number] DOB: May 18, 1941 Today's Date: 03/14/2023  History of Present Illness  Pt is an 82 y/o female presenting with R facial droop, slurred speech and R sided weakness. MRI brain negative for acute infarct but showed L temporal lobe and L basal ganglia encephalomalacia. PMH: CKD, GERD, HTN, HLD, lumbar radiculopathy, DM2, CAD   Clinical Impression  Pt admitted with above diagnosis. Pt currently with functional limitations due to the deficits listed below (see PT Problem List). At the time of PT eval pt was able to perform transfers and ambulation with gross min assist and RW for support. Pt with difficulty maneuvering the RW at times. Pt requesting rollator but feel she currently needs the increased stability of the RW vs rollator. If ALF is able to accept her back pt may require a little more assistance than baseline. Otherwise recommend continued rehab <3 hours/day to maximize functional independence and safety to facilitate return to ALF. Acutely, pt will benefit from acute skilled PT to increase their independence and safety with mobility to allow discharge.           If plan is discharge home, recommend the following: A little help with walking and/or transfers;A little help with bathing/dressing/bathroom;Assistance with cooking/housework;Assist for transportation;Help with stairs or ramp for entrance   Can travel by private vehicle   Yes    Equipment Recommendations Other (comment) (TBD by next venue of care)  Recommendations for Other Services       Functional Status Assessment Patient has had a recent decline in their functional status and demonstrates the ability to make significant improvements in function in a reasonable and predictable amount of time.     Precautions / Restrictions Precautions Precautions: Fall;Other (comment) Precaution Comments: R AFO Restrictions Weight Bearing Restrictions: No       Mobility  Bed Mobility               General bed mobility comments: Pt was received sitting up in recliner.    Transfers Overall transfer level: Needs assistance Equipment used: Rolling walker (2 wheels) Transfers: Sit to/from Stand, Bed to chair/wheelchair/BSC Sit to Stand: Min assist           General transfer comment: with shoes and AFO donned pt was able to power up to full stand at recliner. Min assist and VC's for hand placement on seated surface for safety. Increased time and multiple attempts to achieve full stand initially.    Ambulation/Gait Ambulation/Gait assistance: Min assist Gait Distance (Feet): 30 Feet (15' to bathroom and 15' back to the chair) Assistive device: Rolling walker (2 wheels) Gait Pattern/deviations: Step-to pattern, Decreased stride length, Trunk flexed, Decreased weight shift to right, Decreased step length - right Gait velocity: Decreased Gait velocity interpretation: <1.31 ft/sec, indicative of household ambulator   General Gait Details: Pt reports she does not like the RW, states she feels it will roll out from under her. Explained reasoning for RW instead of the rollator at this time. Pt reaching for outside support and required cues to keep hands on the RW and min assist for balance support/walker management.  Stairs            Wheelchair Mobility     Tilt Bed    Modified Rankin (Stroke Patients Only)       Balance Overall balance assessment: Needs assistance Sitting-balance support: No upper extremity supported, Feet supported Sitting balance-Leahy Scale: Fair     Standing balance support:  Bilateral upper extremity supported, During functional activity Standing balance-Leahy Scale: Poor                               Pertinent Vitals/Pain Pain Assessment Pain Assessment: No/denies pain    Home Living Family/patient expects to be discharged to:: Assisted living     Type of Home: Assisted  living           Home Equipment: Rollator (4 wheels);Grab bars - tub/shower;Tub bench      Prior Function Prior Level of Function : History of Falls (last six months);Needs assist;Patient poor historian/Family not available             Mobility Comments: uses Rollator for mobility and R LE AFO. walks to dining hall at ALF ADLs Comments: Reports MOD I for ADLs at ALF however states she has help to put her AFO/shoes on.     Hand Dominance   Dominant Hand: Right    Extremity/Trunk Assessment   Upper Extremity Assessment Upper Extremity Assessment: Defer to OT evaluation    Lower Extremity Assessment Lower Extremity Assessment: RLE deficits/detail RLE Deficits / Details: Baseline deficits with AFO present.    Cervical / Trunk Assessment Cervical / Trunk Assessment: Normal  Communication   Communication: Expressive difficulties  Cognition Arousal/Alertness: Awake/alert Behavior During Therapy: Flat affect, Impulsive Overall Cognitive Status: No family/caregiver present to determine baseline cognitive functioning                                 General Comments: pleasant, anxious with mobility and fearful of falling. some impulsive movements requiring cues for sequencing/pacing. unable to report ALF name where she lives but providing PLOF info, aware of incoming hurricane, current Olympics, etc. showing insight into impaired mobility        General Comments      Exercises     Assessment/Plan    PT Assessment Patient needs continued PT services  PT Problem List Decreased strength;Decreased activity tolerance;Decreased balance;Decreased mobility;Decreased knowledge of use of DME;Decreased safety awareness;Decreased cognition;Decreased knowledge of precautions       PT Treatment Interventions DME instruction;Gait training;Functional mobility training;Therapeutic activities;Therapeutic exercise;Balance training;Patient/family education    PT Goals  (Current goals can be found in the Care Plan section)  Acute Rehab PT Goals Patient Stated Goal: Be able to use her rollator PT Goal Formulation: With patient Time For Goal Achievement: 03/21/23 Potential to Achieve Goals: Good    Frequency Min 1X/week     Co-evaluation               AM-PAC PT "6 Clicks" Mobility  Outcome Measure Help needed turning from your back to your side while in a flat bed without using bedrails?: A Little Help needed moving from lying on your back to sitting on the side of a flat bed without using bedrails?: A Little Help needed moving to and from a bed to a chair (including a wheelchair)?: A Little Help needed standing up from a chair using your arms (e.g., wheelchair or bedside chair)?: A Little Help needed to walk in hospital room?: A Little Help needed climbing 3-5 steps with a railing? : A Little 6 Click Score: 18    End of Session Equipment Utilized During Treatment: Gait belt Activity Tolerance: Patient tolerated treatment well Patient left: in chair;with call bell/phone within reach;with chair alarm set Nurse Communication: Mobility status  PT Visit Diagnosis: Unsteadiness on feet (R26.81);Difficulty in walking, not elsewhere classified (R26.2)    Time: 1055-1130 PT Time Calculation (min) (ACUTE ONLY): 35 min   Charges:   PT Evaluation $PT Eval Moderate Complexity: 1 Mod PT Treatments $Gait Training: 8-22 mins PT General Charges $$ ACUTE PT VISIT: 1 Visit         Conni Slipper, PT, DPT Acute Rehabilitation Services Secure Chat Preferred Office: [phone]   Marylynn Pearson 03/14/2023, 12:26 PM

## 2023-03-14 NOTE — Evaluation (Signed)
Occupational Therapy Evaluation Patient Details Name: Cassidy Hernandez MRN: [id number] DOB: 1941/04/21 Today's Date: 03/14/2023   History of Present Illness Pt is an 82 y/o female presenting with R facial droop, slurred speech and R sided weakness. MRI brain negative for acute infarct but showed L temporal lobe and L basal ganglia encephalomalacia. PMH: CKD, GERD, HTN, HLD, lumbar radiculopathy, DM2, CAD   Clinical Impression   PTA, pt from ALF, typically ambulatory with Rollator and reports Modified [place] with ADLs. Pt reports typically ambulating with R LE AFO (present on eval though no shoes). Pt with symmetrical and WFL strength/coordination in BUE but does endorse increased RLE weakness. Overall,pt requires Mod A for bed mobility, Mod A for standing attempts with RW (unable to progress away from bed) and ultimately requiring use of Stedy for BSC/recliner transfers. Pt requires Min A for UB ADL and Mod A for LB ADLs d/t deficits. If ALF staff unable to provide the current physical assist pt requires, pt will benefit from continued inpatient follow up therapy, <3 hours/day at DC.   Recommendations for follow up therapy are one component of a multi-disciplinary discharge planning process, led by the attending physician.  Recommendations may be updated based on patient status, additional functional criteria and insurance authorization.   Assistance Recommended at Discharge Frequent or constant Supervision/Assistance  Patient can return home with the following A lot of help with walking and/or transfers;Two people to help with walking and/or transfers;A lot of help with bathing/dressing/bathroom    Functional Status Assessment  Patient has had a recent decline in their functional status and demonstrates the ability to make significant improvements in function in a reasonable and predictable amount of time.  Equipment Recommendations  Other (comment) (TBD pending progress)    Recommendations  for Other Services       Precautions / Restrictions Precautions Precautions: Fall;Other (comment) Precaution Comments: R AFO (no shoes present on eval) Restrictions Weight Bearing Restrictions: No      Mobility Bed Mobility Overal bed mobility: Needs Assistance Bed Mobility: Supine to Sit     Supine to sit: Mod assist, HOB elevated     General bed mobility comments: pt requesting handheld assist, cues/assist to advance BLE first to EOB with assist to lift trunk    Transfers Overall transfer level: Needs assistance Equipment used: Rolling walker (2 wheels), Ambulation equipment used Transfers: Sit to/from Stand, Bed to chair/wheelchair/BSC Sit to Stand: Mod assist           General transfer comment: Initial 2 stands with RW at Mod A, noted decreased RLE control and on second trial, RLE moving into extension. Pt unable to gain balance for further RW use. Opted for Stedy use at [place] A for transfer to [facility] and then to recliner afterwards Transfer via Lift Equipment: [employer] Overall balance assessment: Needs assistance Sitting-balance support: No upper extremity supported, Feet supported Sitting balance-Leahy Scale: Fair     Standing balance support: Bilateral upper extremity supported, During functional activity Standing balance-Leahy Scale: Poor                             ADL either performed or assessed with clinical judgement   ADL Overall ADL's : Needs assistance/impaired Eating/Feeding: Set up   Grooming: Set up;Sitting   Upper Body Bathing: Minimal assistance;Sitting   Lower Body Bathing: Moderate assistance;Sit to/from stand;Sitting/lateral leans   Upper Body Dressing : Minimal assistance;Sitting   Lower Body  Dressing: Moderate assistance;Sitting/lateral leans;Sit to/from Market researcher Details (indicate cue type and reason): use of Stedy to [facility] Toileting- Clothing Manipulation and Hygiene: Moderate  assistance;Sitting/lateral lean Toileting - Clothing Manipulation Details (indicate cue type and reason): assist for clothing mgmt and thoroughness for hygiene. pt able to complete majoirity of hygiene sitting on BSC       General ADL Comments: Limited by RLE incoordination, impaired ability to stand or attempt gait safely with RW     Vision Ability to See in Adequate Light: 0 Adequate Patient Visual Report: No change from baseline Vision Assessment?: No apparent visual deficits     Perception     Praxis      Pertinent Vitals/Pain Pain Assessment Pain Assessment: No/denies pain     Hand Dominance Right   Extremity/Trunk Assessment Upper Extremity Assessment Upper Extremity Assessment: Generalized weakness   Lower Extremity Assessment Lower Extremity Assessment: Defer to PT evaluation   Cervical / Trunk Assessment Cervical / Trunk Assessment: Normal   Communication Communication Communication: Expressive difficulties   Cognition Arousal/Alertness: Awake/alert Behavior During Therapy: Flat affect, Impulsive Overall Cognitive Status: No family/caregiver present to determine baseline cognitive functioning                                 General Comments: pleasant, anxious with mobility and fearful of falling. some impulsive movements requiring cues for sequencing/pacing. unable to report ALF name where she lives but providing PLOF info, aware of incoming hurricane, current Olympics, etc. showing insight into impaired mobility     General Comments       Exercises     Shoulder Instructions      Home Living Family/patient expects to be discharged to:: Assisted living                             Home Equipment: Rollator (4 wheels);Grab bars - tub/shower;Tub bench          Prior Functioning/Environment Prior Level of Function : History of Falls (last six months);Needs assist;Patient poor historian/Family not available              Mobility Comments: uses Rollator for mobility and R LE AFO. walks to dining hall at ALF ADLs Comments: Reports MOD I for ADLs at ALF        OT Problem List: Decreased strength;Decreased activity tolerance;Impaired balance (sitting and/or standing);Decreased knowledge of use of DME or AE;Decreased cognition;Decreased coordination      OT Treatment/Interventions: Self-care/ADL training;Therapeutic exercise;Energy conservation;DME and/or AE instruction;Therapeutic activities;Patient/family education;Balance training    OT Goals(Current goals can be found in the care plan section) Acute Rehab OT Goals Patient Stated Goal: be able to walk again, avoid falls OT Goal Formulation: With patient Time For Goal Achievement: 03/28/23 Potential to Achieve Goals: Good ADL Goals Pt Will Perform Lower Body Bathing: with min guard assist;sit to/from stand;sitting/lateral leans Pt Will Perform Lower Body Dressing: with min guard assist;sit to/from stand;sitting/lateral leans Pt Will Transfer to Toilet: with min assist;stand pivot transfer;bedside commode Pt Will Perform Toileting - Clothing Manipulation and hygiene: with min guard assist;sit to/from stand;sitting/lateral leans  OT Frequency: Min 2X/week    Co-evaluation              AM-PAC OT "6 Clicks" Daily Activity     Outcome Measure Help from another person eating meals?: A Little Help from another person  taking care of personal grooming?: A Little Help from another person toileting, which includes using toliet, bedpan, or urinal?: A Lot Help from another person bathing (including washing, rinsing, drying)?: A Lot Help from another person to put on and taking off regular upper body clothing?: A Little Help from another person to put on and taking off regular lower body clothing?: A Lot 6 Click Score: 15   End of Session Equipment Utilized During Treatment: Gait belt Nurse Communication: Mobility status  Activity Tolerance: Patient  tolerated treatment well Patient left: in chair;with call bell/phone within reach;with chair alarm set  OT Visit Diagnosis: Unsteadiness on feet (R26.81);Other abnormalities of gait and mobility (R26.89)                Time: 5284-1324 OT Time Calculation (min): 32 min Charges:  OT General Charges $OT Visit: 1 Visit OT Evaluation $OT Eval Moderate Complexity: 1 Mod OT Treatments $Self Care/Home Management : 8-22 mins  Bradd Canary, OTR/L Acute Rehab Services Office: [phone]   Lorre Munroe 03/14/2023, 8:59 AM

## 2023-03-14 NOTE — TOC Progression Note (Signed)
Transition of Care [facility]) - Progression Note    Patient Details  Name: KINLEA GEHO MRN: [id number] Date of Birth: [birth year]  [facility] of [facility]) CM/SW Contact  Erin Sons, [name] Phone Number: 03/14/2023, 3:43 PM  Clinical Narrative:     SNF auth submitted in navi portal; Status pending. EXB#[id number]    Barriers to Discharge: Continued Medical Work up  Expected Discharge Plan and Services In-house Referral: Clinical Social Work Discharge Planning Services: CM Consult                                           Social Determinants of Health (SDOH) Interventions SDOH Screenings   Food Insecurity: No Food Insecurity (03/14/2023)  Housing: Low Risk  (03/14/2023)  Transportation Needs: No Transportation Needs (03/14/2023)  Utilities: Not At Risk (03/14/2023)  Depression (PHQ2-9): Low Risk  (10/17/2019)  Tobacco Use: Low Risk  (03/14/2023)  Health Literacy: Adequate Health Literacy (12/07/2021)   Received from [facility], [facility]    Readmission Risk Interventions     No data to display

## 2023-03-14 NOTE — Progress Notes (Signed)
Neurology Progress Note  Brief HPI: 82 y.o. 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  She presents via EMS from her assisted living facility.  She apparently spoke with her daughter on the phone around 9 PM last night and was in her usual state of health.  This morning she was found by staff confused with a right facial droop and EMS was called.  There is a note from her PCP that states she does have bacteria in her urine.  Additionally she did have a left ICA stent placed in April 2023 after she was seen as a code stroke with a left distal M1 and proximal M2 occlusion with 80% stenosis of the left ICA and she is currently on aspirin and Brilinta.   Subjective: Patient seen in room sitting up in the chair  EEG is being applied She states she is feeling better, but she does not completely understand why she is in the hospital.   Exam: Vitals:   03/14/23 0442 03/14/23 0756  BP:  138/75  Pulse:  80  Resp:  17  Temp:  97.8 F (36.6 C)  SpO2: 93% 93%   Gen: In bed, NAD Resp: non-labored breathing, no acute distress Abd: soft, nt  Neuro: Mental Status: Alert and oriented to self, place. Some confusion on date. She states she is in the hospital because someone wanted her to be here. She does know that she was more confused yesterday Speech is dysarthric. Patient is able to give a clear and coherent history. No signs of aphasia or neglect Cranial Nerves: II: Visual Fields are full. PERRL.   III,IV, VI: EOMI without ptosis or diploplia.  V: Facial sensation is symmetric to temperature VII: Facial movement is symmetric resting and smiling VIII: Hearing is intact to voice X: Palate elevates symmetrically XI: Shoulder shrug is symmetric. XII: Tongue protrudes midline without atrophy or fasciculations.  Motor: Tone is normal. Bulk  is normal. 5/5 strength was present in all four extremities.  Sensory: Sensation is symmetric to light touch and temperature in the arms and legs. No extinction to DSS present.  Cerebellar: FNF and HKS are intact bilaterally, rapid alternating movements Gait: steady with assistance    Pertinent Labs: UA- no UTI   Imaging Reviewed: MRI Brain - No acute intracranial abnormality. Anterior left temporal lobe and left basal ganglia encephalomalacia. Pending   Assessment:  Recrudescence of previous stroke symptoms Seizure? No metabolic or infectious source found so far. Awaiting EEG results, may have a seizure component given temporal involvement of previous stroke. She may be able to be discharged after EEG results.   Recommendations: - EEG pending    Patient seen and examined by NP/APP with MD. MD to update note as needed.   Cassidy Picker, DNP, FNP-BC [facility] Pager: [phone]  NEUROHOSPITALIST ADDENDUM Performed a face to face diagnostic evaluation.   I have reviewed the contents of history and physical exam as documented by PA/ARNP/Resident and agree with above documentation.  I have discussed and formulated the above plan as documented. Edits to the note have been made as needed.  Impression/Key exam findings/Plan: MRI brain with no acute strokes. she is back to her baseline. At baseline, has moderate aphasia and mild RLE weakness. Will get routine EEG to evaluate for any epileptogenic abnormalities. If EEG is negative, she can be  discharged with outpatient neurology follow up.  Cassidy Blinks, MD [facility] [phone]   If 7pm to 7am, please call on call as listed on AMION.

## 2023-03-14 NOTE — Hospital Course (Signed)
Cassidy Hernandez is a 82 y.o. female with medical history significant of anginal pain and aortic atherosclerosis, carotid artery stenosis, chronic lower back pain, stage IIIa CKD, CAD, osteoarthritis, grade 1 diastolic dysfunction, dysarthria, GERD, hepatic steatosis, nephrolithiasis, hyperlipidemia, hypertension, major depression in remission, peripheral vascular disease,  subclavian steal syndrome, type 2 diabetes was brought to the emergency department from assisted living facility with confusion and right facial droop and right-sided grip weakness.  In the ED vitals were notable for blood pressure at 220/94.  Urinalysis showed proteinuria.  CBC within normal range.  CMP with creatinine of 1.0.  CT head code stroke no acute intracranial hemorrhage or evidence of acute large vessel territory infarct.  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  CTA head and neck with no large vessel occlusion or significant stenosis of the head and neck.  Patent with left ICA stent.  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  No core infarct or ischemic penumbra on CT perfusion.  Patient was then considered for admission to hospital for further evaluation and treatment.    Assessment and plan.  Dysarthria/right hemiparesis. Seen by code stroke team.  Not a candidate for thrombolytic due to out of window.  CT scan without acute findings.  MRI of the brain without any acute abnormality except for left temporal and basal ganglia encephalomalacia.  Check PT OT, lipid profile with LDL of 41.  hemoglobin A1c of 6.4.  Check 2D echocardiogram.  Continue Lipitor, aspirin.  Follow neurology recommendations.  Urine drug screen was negative.  CAD  Continue aspirin and statins.  Beta-blocker on hold.     Controlled type 2 diabetes mellitus with stage 3 chronic kidney disease,   without long-term current use of insulin  Continue sliding scale insulin, Accu-Cheks,  diabetic diet.    Gastroesophageal reflux disease without esophagitis Continue Protonix.     Hypercholesteremia Continue Lipitor.     Major depression in remission Continue Lexapro.

## 2023-03-14 NOTE — Care Management Obs Status (Signed)
MEDICARE OBSERVATION STATUS NOTIFICATION   Patient Details  Name: Cassidy Hernandez MRN: [id number] Date of Birth: 10/02/1940   Medicare Observation Status Notification Given:  Yes    Lawerance Sabal, RN 03/14/2023, 4:21 PM

## 2023-03-14 NOTE — Procedures (Signed)
Routine EEG Report  Cassidy Hernandez is a 82 y.o. female with a history of stroke who is undergoing an EEG to evaluate for seizures.  Report: This EEG was acquired with electrodes placed according to the International 10-20 electrode system (including Fp1, Fp2, F3, F4, C3, C4, P3, P4, O1, O2, T3, T4, T5, T6, A1, A2, Fz, Cz, Pz). The following electrodes were missing or displaced: none.  The occipital dominant rhythm was 8.5 Hz. This activity is reactive to stimulation. Drowsiness was manifested by background fragmentation; deeper stages of sleep were not identified. There was focal slowing over the left temporal region. There were no interictal epileptiform discharges. There were no electrographic seizures identified. Photic stimulation and hyperventilation were not performed.  Impression and clinical correlation: This EEG was obtained while awake and drowsy and is abnormal due to focal slowing over the region of her prior stroke. No epileptiform abnormalities were seen during this recording.  Bing Neighbors, MD [facility] [phone]  If 7pm- 7am, please page neurology on call as listed in AMION.

## 2023-03-14 NOTE — Plan of Care (Signed)
  Problem: Education: Goal: Knowledge of disease or condition will improve Outcome: Progressing Goal: Knowledge of secondary prevention will improve (MUST DOCUMENT ALL) Outcome: Progressing Goal: Knowledge of patient specific risk factors will improve Elta Guadeloupe N/A or DELETE if not current risk factor) Outcome: Progressing   Problem: Ischemic Stroke/TIA Tissue Perfusion: Goal: Complications of ischemic stroke/TIA will be minimized Outcome: Progressing   Problem: Coping: Goal: Will verbalize positive feelings about self Outcome: Progressing Goal: Will identify appropriate support needs Outcome: Progressing

## 2023-03-14 NOTE — Progress Notes (Signed)
Patient notified and agreed.  Labs Abstracted.

## 2023-03-14 NOTE — Progress Notes (Signed)
Attempted echo 2x this AM -- pt getting EEG and then in chair eating breakfast. Will try again this afternoon.
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
## 2023-03-15 DIAGNOSIS — K219 Gastro-esophageal reflux disease without esophagitis: Secondary | ICD-10-CM | POA: Diagnosis not present

## 2023-03-15 DIAGNOSIS — R531 Weakness: Secondary | ICD-10-CM | POA: Diagnosis not present

## 2023-03-15 DIAGNOSIS — E1122 Type 2 diabetes mellitus with diabetic chronic kidney disease: Secondary | ICD-10-CM | POA: Diagnosis not present

## 2023-03-15 DIAGNOSIS — R471 Dysarthria and anarthria: Secondary | ICD-10-CM | POA: Diagnosis not present

## 2023-03-15 LAB — GLUCOSE, CAPILLARY
Glucose-Capillary: 122 mg/dL — ABNORMAL HIGH (ref 70–99)
Glucose-Capillary: 115 mg/dL — ABNORMAL HIGH (ref 70–99)
Glucose-Capillary: 132 mg/dL — ABNORMAL HIGH (ref 70–99)
Glucose-Capillary: 125 mg/dL — ABNORMAL HIGH (ref 70–99)
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
## 2023-03-15 NOTE — Plan of Care (Signed)
  Problem: Education: Goal: Knowledge of disease or condition will improve Outcome: Progressing   Problem: Ischemic Stroke/TIA Tissue Perfusion: Goal: Complications of ischemic stroke/TIA will be minimized Outcome: Progressing   Problem: Coping: Goal: Will verbalize positive feelings about self Outcome: Progressing Goal: Will identify appropriate support needs Outcome: Progressing   Problem: Coping: Goal: Will identify appropriate support needs Outcome: Progressing

## 2023-03-15 NOTE — Progress Notes (Signed)
PROGRESS NOTE    [name]  NWG:[id number] DOB: [birth year] DOA: 03/13/2023 PCP: Earnestine Mealing, MD    Brief Narrative:   Cassidy Hernandez is a 82 y.o. female with medical history significant of anginal pain and aortic atherosclerosis, carotid artery stenosis, chronic lower back pain, stage IIIa CKD, CAD, osteoarthritis, grade 1 diastolic dysfunction, dysarthria, GERD, hepatic steatosis, nephrolithiasis, hyperlipidemia, hypertension, major depression in remission, peripheral vascular disease,  subclavian steal syndrome, type 2 diabetes was brought to the emergency department from assisted living facility with confusion and right facial droop and right-sided grip weakness.  In the ED vitals were notable for blood pressure at 220/94.  Urinalysis showed proteinuria.  CBC within normal range.  CMP with creatinine of 1.0.  CT head code stroke no acute intracranial hemorrhage or evidence of acute large vessel territory infarct.  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  CTA head and neck with no large vessel occlusion or significant stenosis of the head and neck.  Patent with left ICA stent.  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  No core infarct or ischemic penumbra on CT perfusion.  Patient was then considered for admission to hospital for further evaluation and treatment.    Assessment and plan.  Dysarthria/right hemiparesis. Seen by code stroke team.  Not a candidate for thrombolytic due to out of window.  CT scan without acute findings.  MRI of the brain without any acute abnormality except for left temporal and basal ganglia encephalomalacia.  Seen by PT OT and recommended skilled nursing facility placement.  Lipid profile with LDL of 41.  hemoglobin A1c of 6.4. 2D echocardiogram with LV ejection fraction of 65 to 70% with diastolic dysfunction.  Bubble study was negative.  Continue Lipitor, aspirin.  Patient has been restarted  on Brilinta.  Urine drug screen was negative.  EEG showed some encephalopathy.  No focal seizures.  Okay from neurology point of view for discharge.  CAD  Continue aspirin and statins.  Resumed beta-blocker.     Controlled type 2 diabetes mellitus with stage 3 chronic kidney disease,   without long-term current use of insulin  Continue sliding scale insulin, Accu-Cheks, diabetic diet.  Not on medications at home.  Diet controlled.    Gastroesophageal reflux disease without esophagitis Continue Protonix.     Hypercholesteremia Continue Lipitor.     Major depression in remission Continue Lexapro.  Hypertension.  Restart beta-blocker and losartan.  Monitor blood pressure.       DVT prophylaxis: enoxaparin (LOVENOX) injection 40 mg Start: 03/13/23 2200   Code Status:     Code Status: Full Code  Disposition: Skilled nursing facility as per PT recommendation.  Medically stable for disposition.  Status is: Observation  The patient will require care spanning > 2 midnights and should be moved to inpatient because: Need for skilled nursing facility placement,   Family Communication:   Spoke with the patient's daughter on the phone 03/14/2023.  Consultants:  Neurology  Procedures:  EEG  Antimicrobials:  None  Anti-infectives (From admission, onward)    None       Subjective: Today, patient was seen and examined at bedside.  Patient denies any dizziness lightheadedness focal weakness nausea vomiting.    Objective: Vitals:   03/14/23 2340 03/15/23 0336 03/15/23 0800 03/15/23 1200  BP: 126/64 119/66 (!) 143/78 (!) 179/79  Pulse: 76 82 77 80  Resp: 17 18 17 18   Temp: 98.2 F (36.8  C) 97.8 F (36.6 C) 97.9 F (36.6 C) 97.9 F (36.6 C)  TempSrc: Oral Oral Oral Oral  SpO2: 92% 91% 95% 95%  Weight:      Height:        Intake/Output Summary (Last 24 hours) at 03/15/2023 1337 Last data filed at 03/15/2023 0600 Gross per 24 hour  Intake --  Output 500 ml  Net -500 ml    Filed Weights   27-Mar-2023 1217  Weight: 68 kg    Physical Examination: Body mass index is 26.57 kg/m.   General:  Average built, not in obvious distress, elderly female. HENT:   No scleral pallor or icterus noted. Oral mucosa is moist.  Chest:  Clear breath sounds.   No crackles or wheezes.  CVS: S1 &S2 heard. No murmur.  Regular rate and rhythm. Abdomen: Soft, nontender, nondistended.  Bowel sounds are heard.   Extremities: No cyanosis, clubbing or edema.  Peripheral pulses are palpable. Psych: Alert, awake and Communicative. CNS:  No cranial nerve deficits.  Generalized weakness noted. Skin: Warm and dry.  No rashes noted.  Data Reviewed:   CBC: Recent Labs  Lab 03/13/23 1029 03/13/23 1034  WBC 7.2  --   NEUTROABS 4.6  --   HGB 13.4 13.9  HCT 41.2 41.0  MCV 94.7  --   PLT 256  --     Basic Metabolic Panel: Recent Labs  Lab 03/13/23 1029 03/13/23 1034  NA 139 140  K 3.6 3.5  CL 102 104  CO2 26  --   GLUCOSE 108* 103*  BUN 15 16  CREATININE 1.07* 1.10*  CALCIUM 9.1  --     Liver Function Tests: Recent Labs  Lab 03/13/23 1029  AST 29  ALT 26  ALKPHOS 51  BILITOT 0.7  PROT 6.9  ALBUMIN 3.6     Radiology Studies: EEG adult  Result Date: 03-27-2023 Jefferson Fuel, MD     03-27-2023  4:10 PM Routine EEG Report Cassidy Hernandez is a 82 y.o. female with a history of stroke who is undergoing an EEG to evaluate for seizures. Report: This EEG was acquired with electrodes placed according to the International 10-20 electrode system (including Fp1, Fp2, F3, F4, C3, C4, P3, P4, O1, O2, T3, T4, T5, T6, A1, A2, Fz, Cz, Pz). The following electrodes were missing or displaced: none. The occipital dominant rhythm was 8.5 Hz. This activity is reactive to stimulation. Drowsiness was manifested by background fragmentation; deeper stages of sleep were not identified. There was focal slowing over the left temporal region. There were no interictal epileptiform discharges.  There were no electrographic seizures identified. Photic stimulation and hyperventilation were not performed. Impression and clinical correlation: This EEG was obtained while awake and drowsy and is abnormal due to focal slowing over the region of her prior stroke. No epileptiform abnormalities were seen during this recording. Bing Neighbors, MD [facility] [phone] If 7pm- 7am, please page neurology on call as listed in AMION.   ECHOCARDIOGRAM COMPLETE BUBBLE STUDY  Result Date: 03-27-23    ECHOCARDIOGRAM REPORT   Patient Name:   XAVIER FORRISTER Date of Exam: 2023-03-27 Medical Rec #:  [id number]     Height:       63.0 in Accession #:    [id number]    Weight:       149.6 lb Date of Birth:  1941/04/15     BSA:          1.709 m Patient Age:  82 years      BP:           146/58 mmHg Patient Gender: F             HR:           78 bpm. Exam Location:  Inpatient Procedure: 2D Echo, Cardiac Doppler, Color Doppler and Saline Contrast Bubble            Study Indications:    Stroke  History:        Patient has prior history of Echocardiogram examinations, most                 recent 01/20/2022. CAD, Stroke, Carotid Disease, PAD and CKD;                 Risk Factors:Diabetes, Dyslipidemia and Hypertension.  Sonographer:    Wallie Char Referring Phys: Sanda Klein, Kelby Fam  Sonographer Comments: Image acquisition challenging due to patient body habitus. IMPRESSIONS  1. Intracavitary gradient. Peak velocity 1.69 m/s. Peak gradient 11.4 mmHg. Left ventricular ejection fraction, by estimation, is 65 to 70%. The left ventricle has normal function. The left ventricle has no regional wall motion abnormalities. Left ventricular diastolic parameters are consistent with Grade I diastolic dysfunction (impaired relaxation).  2. Right ventricular systolic function is normal. The right ventricular size is normal. There is normal pulmonary artery systolic pressure.  3. The mitral valve is normal in structure. Trivial mitral  valve regurgitation. No evidence of mitral stenosis.  4. The aortic valve is tricuspid. Aortic valve regurgitation is mild. No aortic stenosis is present.  5. The inferior vena cava is normal in size with greater than 50% respiratory variability, suggesting right atrial pressure of 3 mmHg.  6. Agitated saline contrast bubble study was negative, with no evidence of any interatrial shunt. FINDINGS  Left Ventricle: Intracavitary gradient. Peak velocity 1.69 m/s. Peak gradient 11.4 mmHg. Left ventricular ejection fraction, by estimation, is 65 to 70%. The left ventricle has normal function. The left ventricle has no regional wall motion abnormalities. The left ventricular internal cavity size was normal in size. There is no left ventricular hypertrophy. Left ventricular diastolic parameters are consistent with Grade I diastolic dysfunction (impaired relaxation). Indeterminate filling pressures. Right Ventricle: The right ventricular size is normal. No increase in right ventricular wall thickness. Right ventricular systolic function is normal. There is normal pulmonary artery systolic pressure. The tricuspid regurgitant velocity is 2.75 m/s, and  with an assumed right atrial pressure of 3 mmHg, the estimated right ventricular systolic pressure is 33.2 mmHg. Left Atrium: Left atrial size was normal in size. Right Atrium: Right atrial size was normal in size. Pericardium: There is no evidence of pericardial effusion. Mitral Valve: The mitral valve is normal in structure. Mild mitral annular calcification. Trivial mitral valve regurgitation. No evidence of mitral valve stenosis. MV peak gradient, 4.8 mmHg. The mean mitral valve gradient is 2.0 mmHg. Tricuspid Valve: The tricuspid valve is normal in structure. Tricuspid valve regurgitation is trivial. No evidence of tricuspid stenosis. Aortic Valve: The aortic valve is tricuspid. Aortic valve regurgitation is mild. No aortic stenosis is present. Aortic valve mean gradient  measures 4.0 mmHg. Aortic valve peak gradient measures 7.4 mmHg. Aortic valve area, by VTI measures 1.33 cm. Pulmonic Valve: The pulmonic valve was normal in structure. Pulmonic valve regurgitation is mild to moderate. No evidence of pulmonic stenosis. Aorta: The aortic root is normal in size and structure. Venous: The inferior vena cava is normal in size  with greater than 50% respiratory variability, suggesting right atrial pressure of 3 mmHg. IAS/Shunts: No atrial level shunt detected by color flow Doppler. Agitated saline contrast was given intravenously to evaluate for intracardiac shunting. Agitated saline contrast bubble study was negative, with no evidence of any interatrial shunt.  LEFT VENTRICLE PLAX 2D LVIDd:         3.70 cm     Diastology LVIDs:         2.50 cm     LV e' medial:    5.58 cm/s LV PW:         0.80 cm     LV E/e' medial:  11.5 LV IVS:        0.80 cm     LV e' lateral:   5.81 cm/s LVOT diam:     1.50 cm     LV E/e' lateral: 11.0 LV SV:         35 LV SV Index:   21 LVOT Area:     1.77 cm  LV Volumes (MOD) LV vol d, MOD A4C: 58.4 ml LV vol s, MOD A4C: 19.5 ml LV SV MOD A4C:     58.4 ml RIGHT VENTRICLE RV Basal diam:  2.70 cm RV S prime:     13.70 cm/s TAPSE (M-mode): 2.0 cm LEFT ATRIUM             Index        RIGHT ATRIUM          Index LA diam:        3.60 cm 2.11 cm/m   RA Area:     9.30 cm LA Vol (A2C):   36.1 ml 21.12 ml/m  RA Volume:   15.70 ml 9.19 ml/m LA Vol (A4C):   34.9 ml 20.42 ml/m LA Biplane Vol: 36.2 ml 21.18 ml/m  AORTIC VALVE AV Area (Vmax):    1.25 cm AV Area (Vmean):   1.22 cm AV Area (VTI):     1.33 cm AV Vmax:           136.00 cm/s AV Vmean:          97.700 cm/s AV VTI:            0.265 m AV Peak Grad:      7.4 mmHg AV Mean Grad:      4.0 mmHg LVOT Vmax:         96.50 cm/s LVOT Vmean:        67.300 cm/s LVOT VTI:          0.200 m LVOT/AV VTI ratio: 0.75  AORTA Ao Root diam: 2.80 cm Ao Asc diam:  3.00 cm MITRAL VALVE                TRICUSPID VALVE MV Area (PHT):  2.43 cm     TR Peak grad:   30.2 mmHg MV Area VTI:   1.51 cm     TR Vmax:        275.00 cm/s MV Peak grad:  4.8 mmHg MV Mean grad:  2.0 mmHg     SHUNTS MV Vmax:       1.10 m/s     Systemic VTI:  0.20 m MV Vmean:      68.0 cm/s    Systemic Diam: 1.50 cm MV Decel Time: 312 msec MV E velocity: 64.10 cm/s MV A velocity: 102.00 cm/s MV E/A ratio:  0.63 Chilton Si MD Electronically signed by Chilton Si MD Signature Date/Time: 03/14/2023/3:26:48  PM    Final    MR BRAIN WO CONTRAST  Result Date: 03/13/2023 CLINICAL DATA:  Acute neurologic deficit EXAM: MRI HEAD WITHOUT CONTRAST TECHNIQUE: Multiplanar, multiecho pulse sequences of the brain and surrounding structures were obtained without intravenous contrast. COMPARISON:  11/17/2021 FINDINGS: Brain: No acute infarct, mass effect or extra-axial collection. No acute or chronic hemorrhage. There is multifocal hyperintense T2-weighted signal within the white matter. Generalized volume loss. Anterior left temporal lobe and left basal ganglia encephalomalacia. The midline structures are normal. Vascular: Major flow voids are preserved. Skull and upper cervical spine: Normal calvarium and skull base. Visualized upper cervical spine and soft tissues are normal. Sinuses/Orbits:No paranasal sinus fluid levels or advanced mucosal thickening. No mastoid or middle ear effusion. Normal orbits. IMPRESSION: 1. No acute intracranial abnormality. 2. Anterior left temporal lobe and left basal ganglia encephalomalacia. 3. Chronic small vessel ischemia and volume loss. Electronically Signed   By: Deatra Robinson M.D.   On: 03/13/2023 20:21   DG Chest Portable 1 View  Result Date: 03/13/2023 CLINICAL DATA:  ams EXAM: PORTABLE CHEST 1 VIEW COMPARISON:  CXR 11/16/21 FINDINGS: No pleural effusion. No pneumothorax. No focal airspace opacity. Normal cardiac and mediastinal contours. No radiographically apparent displaced rib fractures. Visualized upper abdomen is notable for surgical  clips in the right upper quadrant. Bilateral breast implants. Right-sided shoulder arthroplasty. IMPRESSION: No focal airspace opacity Electronically Signed   By: Lorenza Cambridge M.D.   On: 03/13/2023 15:37      LOS: 0 days    Joycelyn Das, MD [facility] Available via Epic secure chat 7am-7pm After these hours, please refer to coverage provider listed on [email or web address] 03/15/2023, 1:37 PM

## 2023-03-16 DIAGNOSIS — K219 Gastro-esophageal reflux disease without esophagitis: Secondary | ICD-10-CM | POA: Diagnosis not present

## 2023-03-16 DIAGNOSIS — E1122 Type 2 diabetes mellitus with diabetic chronic kidney disease: Secondary | ICD-10-CM | POA: Diagnosis not present

## 2023-03-16 DIAGNOSIS — R471 Dysarthria and anarthria: Secondary | ICD-10-CM | POA: Diagnosis not present

## 2023-03-16 DIAGNOSIS — R531 Weakness: Secondary | ICD-10-CM | POA: Diagnosis not present

## 2023-03-16 LAB — GLUCOSE, CAPILLARY: Glucose-Capillary: 87 mg/dL (ref 70–99)
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
## 2023-03-16 NOTE — Discharge Summary (Signed)
Physician Discharge Summary  Cassidy Hernandez VWU:[id number] DOB: Jun 10, 1941 DOA: 03/13/2023  PCP: Earnestine Mealing, MD  Admit date: 03/13/2023 Discharge date: 03/16/2023  Admitted From: Home  Discharge disposition: SNF   Recommendations for Outpatient Follow-Up:   Follow up with your primary care provider in one week.  Check CBC, BMP, magnesium in the next visit  Discharge Diagnosis:   Principal Problem:   Acute right-sided weakness Active Problems:   CAD (coronary artery disease)   Controlled type 2 diabetes mellitus with stage 3 chronic kidney disease, without long-term current use of insulin (HCC)   Dysarthria   Gastroesophageal reflux disease without esophagitis   Hypercholesteremia   Major depression in remission (HCC)   Right hemiparesis (HCC)   Discharge Condition: Improved.  Diet recommendation: Low sodium, heart healthy.  Carbohydrate-modified.    Wound care: None.  Code status: Full.   History of Present Illness:   Cassidy Hernandez is a 82 y.o. female with medical history significant of anginal pain, aortic atherosclerosis, carotid artery stenosis, chronic lower back pain, stage IIIa CKD, CAD, osteoarthritis, grade 1 diastolic dysfunction, dysarthria, GERD, hepatic steatosis, nephrolithiasis, hyperlipidemia, hypertension, major depression in remission, peripheral vascular disease,  subclavian steal syndrome, type 2 diabetes was brought to the emergency department from assisted living facility with confusion and right facial droop and right-sided grip weakness.    In the ED, vitals were notable for blood pressure at 220/94.  Urinalysis showed proteinuria.  CBC within normal range.  CMP with creatinine of 1.0.  CT head code stroke no acute intracranial hemorrhage or evidence of acute large vessel territory infarct.  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  CTA head and neck with no large vessel occlusion or  significant stenosis of the head and neck.  Patent with left ICA stent.  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  No core infarct or ischemic penumbra on CT perfusion.  Patient was then considered for admission to hospital for further evaluation and treatment.    Hospital Course:   Following conditions were addressed during hospitalization as listed below,  Dysarthria/right hemiparesis. Seen by code stroke team.  Not a candidate for thrombolytic due to out of window.  CT scan without acute findings.  MRI of the brain without any acute abnormality except for left temporal and basal ganglia encephalomalacia.  Patient was seen by PT OT and recommended skilled nursing facility placement.  Lipid profile with LDL of 41.  hemoglobin A1c of 6.4. 2D echocardiogram with LV ejection fraction of 65 to 70% with diastolic dysfunction.  Bubble study was negative.  .  Urine drug screen was negative.  EEG showed some encephalopathy.  No focal seizures.  Okay from neurology point of view for discharge.Continue Lipitor, aspirin, Brilinta   CAD  Continue aspirin and statins, beta-blocker.     Controlled type 2 diabetes mellitus with stage 3 chronic kidney disease,   without long-term current use of insulin   Not on medications at home.  Diet controlled.  Latest hemoglobin A1c of 6.4.     Gastroesophageal reflux disease without esophagitis Continue Protonix.     Hypercholesteremia Continue Lipitor.     Major depression in remission Continue Lexapro.   Hypertension.  Continue beta-blocker and losartan.   Disposition.  At this time, patient is stable for disposition to skilled nursing facility.  Medical Consultants:   Neurology  Procedures:    EEG Subjective:   Today, patient was seen  and examined at bedside.  Denies interval complaints.  Denies any dizziness lightheadedness focal weakness nausea vomiting  Discharge Exam:   Vitals:   03/16/23 0355 03/16/23 0743  BP: (!) 122/48 (!)  177/68  Pulse: 69 71  Resp:  18  Temp: 97.7 F (36.5 C) 97.7 F (36.5 C)  SpO2: 93% 98%   Vitals:   03/15/23 1936 03/16/23 0003 03/16/23 0355 03/16/23 0743  BP: (!) 153/79 (!) 126/43 (!) 122/48 (!) 177/68  Pulse: 76 67 69 71  Resp: 17   18  Temp: 98.1 F (36.7 C) 97.6 F (36.4 C) 97.7 F (36.5 C) 97.7 F (36.5 C)  TempSrc: Oral Oral Oral Oral  SpO2: 95% 92% 93% 98%  Weight:      Height:        General: Alert awake, not in obvious distress, elderly female, HENT: pupils equally reacting to light,  No scleral pallor or icterus noted. Oral mucosa is moist.  Chest:  Clear breath sounds.  Diminished breath sounds bilaterally. No crackles or wheezes.  CVS: S1 &S2 heard. No murmur.  Regular rate and rhythm. Abdomen: Soft, nontender, nondistended.  Bowel sounds are heard.   Extremities: No cyanosis, clubbing or edema.  Peripheral pulses are palpable. Psych: Alert, awake and oriented, normal mood CNS:  No cranial nerve deficits.  Moves all extremities. Skin: Warm and dry.  No rashes noted.  The results of significant diagnostics from this hospitalization (including imaging, microbiology, ancillary and laboratory) are listed below for reference.     Diagnostic Studies:   EEG adult  Result Date: 03/14/2023 Jefferson Fuel, MD     03/14/2023  4:10 PM Routine EEG Report Cassidy Hernandez is a 82 y.o. female with a history of stroke who is undergoing an EEG to evaluate for seizures. Report: This EEG was acquired with electrodes placed according to the International 10-20 electrode system (including Fp1, Fp2, F3, F4, C3, C4, P3, P4, O1, O2, T3, T4, T5, T6, A1, A2, Fz, Cz, Pz). The following electrodes were missing or displaced: none. The occipital dominant rhythm was 8.5 Hz. This activity is reactive to stimulation. Drowsiness was manifested by background fragmentation; deeper stages of sleep were not identified. There was focal slowing over the left temporal region. There were no interictal  epileptiform discharges. There were no electrographic seizures identified. Photic stimulation and hyperventilation were not performed. Impression and clinical correlation: This EEG was obtained while awake and drowsy and is abnormal due to focal slowing over the region of her prior stroke. No epileptiform abnormalities were seen during this recording. Bing Neighbors, MD [facility] [phone] If 7pm- 7am, please page neurology on call as listed in AMION.   ECHOCARDIOGRAM COMPLETE BUBBLE STUDY  Result Date: 03/14/2023    ECHOCARDIOGRAM REPORT   Patient Name:   Cassidy Hernandez Date of Exam: 03/14/2023 Medical Rec #:  [id number]     Height:       63.0 in Accession #:    [id number]    Weight:       149.6 lb Date of Birth:  Feb 08, 1941     BSA:          1.709 m Patient Age:    82 years      BP:           146/58 mmHg Patient Gender: F             HR:           78 bpm. Exam Location:  Inpatient Procedure: 2D Echo, Cardiac Doppler, Color Doppler and Saline Contrast Bubble            Study Indications:    Stroke  History:        Patient has prior history of Echocardiogram examinations, most                 recent 01/20/2022. CAD, Stroke, Carotid Disease, PAD and CKD;                 Risk Factors:Diabetes, Dyslipidemia and Hypertension.  Sonographer:    Wallie Char Referring Phys: Sanda Klein, Kelby Fam  Sonographer Comments: Image acquisition challenging due to patient body habitus. IMPRESSIONS  1. Intracavitary gradient. Peak velocity 1.69 m/s. Peak gradient 11.4 mmHg. Left ventricular ejection fraction, by estimation, is 65 to 70%. The left ventricle has normal function. The left ventricle has no regional wall motion abnormalities. Left ventricular diastolic parameters are consistent with Grade I diastolic dysfunction (impaired relaxation).  2. Right ventricular systolic function is normal. The right ventricular size is normal. There is normal pulmonary artery systolic pressure.  3. The mitral valve is normal in  structure. Trivial mitral valve regurgitation. No evidence of mitral stenosis.  4. The aortic valve is tricuspid. Aortic valve regurgitation is mild. No aortic stenosis is present.  5. The inferior vena cava is normal in size with greater than 50% respiratory variability, suggesting right atrial pressure of 3 mmHg.  6. Agitated saline contrast bubble study was negative, with no evidence of any interatrial shunt. FINDINGS  Left Ventricle: Intracavitary gradient. Peak velocity 1.69 m/s. Peak gradient 11.4 mmHg. Left ventricular ejection fraction, by estimation, is 65 to 70%. The left ventricle has normal function. The left ventricle has no regional wall motion abnormalities. The left ventricular internal cavity size was normal in size. There is no left ventricular hypertrophy. Left ventricular diastolic parameters are consistent with Grade I diastolic dysfunction (impaired relaxation). Indeterminate filling pressures. Right Ventricle: The right ventricular size is normal. No increase in right ventricular wall thickness. Right ventricular systolic function is normal. There is normal pulmonary artery systolic pressure. The tricuspid regurgitant velocity is 2.75 m/s, and  with an assumed right atrial pressure of 3 mmHg, the estimated right ventricular systolic pressure is 33.2 mmHg. Left Atrium: Left atrial size was normal in size. Right Atrium: Right atrial size was normal in size. Pericardium: There is no evidence of pericardial effusion. Mitral Valve: The mitral valve is normal in structure. Mild mitral annular calcification. Trivial mitral valve regurgitation. No evidence of mitral valve stenosis. MV peak gradient, 4.8 mmHg. The mean mitral valve gradient is 2.0 mmHg. Tricuspid Valve: The tricuspid valve is normal in structure. Tricuspid valve regurgitation is trivial. No evidence of tricuspid stenosis. Aortic Valve: The aortic valve is tricuspid. Aortic valve regurgitation is mild. No aortic stenosis is present.  Aortic valve mean gradient measures 4.0 mmHg. Aortic valve peak gradient measures 7.4 mmHg. Aortic valve area, by VTI measures 1.33 cm. Pulmonic Valve: The pulmonic valve was normal in structure. Pulmonic valve regurgitation is mild to moderate. No evidence of pulmonic stenosis. Aorta: The aortic root is normal in size and structure. Venous: The inferior vena cava is normal in size with greater than 50% respiratory variability, suggesting right atrial pressure of 3 mmHg. IAS/Shunts: No atrial level shunt detected by color flow Doppler. Agitated saline contrast was given intravenously to evaluate for intracardiac shunting. Agitated saline contrast bubble study was negative, with no evidence of any interatrial shunt.  LEFT VENTRICLE  PLAX 2D LVIDd:         3.70 cm     Diastology LVIDs:         2.50 cm     LV e' medial:    5.58 cm/s LV PW:         0.80 cm     LV E/e' medial:  11.5 LV IVS:        0.80 cm     LV e' lateral:   5.81 cm/s LVOT diam:     1.50 cm     LV E/e' lateral: 11.0 LV SV:         35 LV SV Index:   21 LVOT Area:     1.77 cm  LV Volumes (MOD) LV vol d, MOD A4C: 58.4 ml LV vol s, MOD A4C: 19.5 ml LV SV MOD A4C:     58.4 ml RIGHT VENTRICLE RV Basal diam:  2.70 cm RV S prime:     13.70 cm/s TAPSE (M-mode): 2.0 cm LEFT ATRIUM             Index        RIGHT ATRIUM          Index LA diam:        3.60 cm 2.11 cm/m   RA Area:     9.30 cm LA Vol (A2C):   36.1 ml 21.12 ml/m  RA Volume:   15.70 ml 9.19 ml/m LA Vol (A4C):   34.9 ml 20.42 ml/m LA Biplane Vol: 36.2 ml 21.18 ml/m  AORTIC VALVE AV Area (Vmax):    1.25 cm AV Area (Vmean):   1.22 cm AV Area (VTI):     1.33 cm AV Vmax:           136.00 cm/s AV Vmean:          97.700 cm/s AV VTI:            0.265 m AV Peak Grad:      7.4 mmHg AV Mean Grad:      4.0 mmHg LVOT Vmax:         96.50 cm/s LVOT Vmean:        67.300 cm/s LVOT VTI:          0.200 m LVOT/AV VTI ratio: 0.75  AORTA Ao Root diam: 2.80 cm Ao Asc diam:  3.00 cm MITRAL VALVE                 TRICUSPID VALVE MV Area (PHT): 2.43 cm     TR Peak grad:   30.2 mmHg MV Area VTI:   1.51 cm     TR Vmax:        275.00 cm/s MV Peak grad:  4.8 mmHg MV Mean grad:  2.0 mmHg     SHUNTS MV Vmax:       1.10 m/s     Systemic VTI:  0.20 m MV Vmean:      68.0 cm/s    Systemic Diam: 1.50 cm MV Decel Time: 312 msec MV E velocity: 64.10 cm/s MV A velocity: 102.00 cm/s MV E/A ratio:  0.63 Chilton Si MD Electronically signed by Chilton Si MD Signature Date/Time: 03/14/2023/3:26:48 PM    Final    MR BRAIN WO CONTRAST  Result Date: 03/13/2023 CLINICAL DATA:  Acute neurologic deficit EXAM: MRI HEAD WITHOUT CONTRAST TECHNIQUE: Multiplanar, multiecho pulse sequences of the brain and surrounding structures were obtained without intravenous contrast. COMPARISON:  11/17/2021 FINDINGS: Brain: No acute infarct,  mass effect or extra-axial collection. No acute or chronic hemorrhage. There is multifocal hyperintense T2-weighted signal within the white matter. Generalized volume loss. Anterior left temporal lobe and left basal ganglia encephalomalacia. The midline structures are normal. Vascular: Major flow voids are preserved. Skull and upper cervical spine: Normal calvarium and skull base. Visualized upper cervical spine and soft tissues are normal. Sinuses/Orbits:No paranasal sinus fluid levels or advanced mucosal thickening. No mastoid or middle ear effusion. Normal orbits. IMPRESSION: 1. No acute intracranial abnormality. 2. Anterior left temporal lobe and left basal ganglia encephalomalacia. 3. Chronic small vessel ischemia and volume loss. Electronically Signed   By: Deatra Robinson M.D.   On: 03/13/2023 20:21   DG Chest Portable 1 View  Result Date: 03/13/2023 CLINICAL DATA:  ams EXAM: PORTABLE CHEST 1 VIEW COMPARISON:  CXR 11/16/21 FINDINGS: No pleural effusion. No pneumothorax. No focal airspace opacity. Normal cardiac and mediastinal contours. No radiographically apparent displaced rib fractures. Visualized upper  abdomen is notable for surgical clips in the right upper quadrant. Bilateral breast implants. Right-sided shoulder arthroplasty. IMPRESSION: No focal airspace opacity Electronically Signed   By: Lorenza Cambridge M.D.   On: 03/13/2023 15:37   CT ANGIO HEAD NECK W WO CM W PERF (CODE STROKE)  Result Date: 03/13/2023 CLINICAL DATA:  Neuro deficit, acute, stroke suspected. EXAM: CT ANGIOGRAPHY HEAD AND NECK CT PERFUSION BRAIN TECHNIQUE: Multidetector CT imaging of the head and neck was performed using the standard protocol during bolus administration of intravenous contrast. Multiplanar CT image reconstructions and MIPs were obtained to evaluate the vascular anatomy. Carotid stenosis measurements (when applicable) are obtained utilizing NASCET criteria, using the distal internal carotid diameter as the denominator. Multiphase CT imaging of the brain was performed following IV bolus contrast injection. Subsequent parametric perfusion maps were calculated using RAPID software. RADIATION DOSE REDUCTION: This exam was performed according to the departmental dose-optimization program which includes automated exposure control, adjustment of the mA and/or kV according to patient size and/or use of iterative reconstruction technique. CONTRAST:  OMNIPAQUE IOHEXOL 350 MG/ML SOLN COMPARISON:  Head CT 03/13/2023. CTA head/neck and CT brain perfusion 11/16/2021. FINDINGS: CTA NECK FINDINGS Aortic arch: Two-vessel arch configuration with common origin of the right brachiocephalic and left common carotid arteries. Atherosclerotic calcifications of the aortic arch and arch vessel origins. Arch vessel origins are patent. Right carotid system: No evidence of dissection, stenosis (50% or greater) or occlusion. Densely calcified plaque of the right carotid bulb and proximal right cervical ICA. Left carotid system: Mixed plaque results in mild stenosis of the distal left CCA. Patent left ICA stent. Vertebral arteries: Densely calcified  plaque results in moderate stenosis of the left vertebral artery origin. Skeleton: Unremarkable. Other neck: Unremarkable. Upper chest: Unremarkable. Review of the MIP images confirms the above findings CTA HEAD FINDINGS Anterior circulation: Calcified plaque along the carotid siphons without hemodynamically significant stenosis. The proximal ACAs and MCAs are patent without stenosis or aneurysm. Distal branches are symmetric. Posterior circulation: Normal basilar artery. The SCAs, AICAs and PICAs are patent proximally. The PCAs are patent proximally without stenosis or aneurysm. Distal branches are symmetric. Venous sinuses: As permitted by contrast timing, patent. Anatomic variants: None. Review of the MIP images confirms the above findings CT Brain Perfusion Findings: ASPECTS: 10 CBF (<30%) Volume: 0mL Perfusion (Tmax>6.0s) volume: 0mL Mismatch Volume: 0mL Infarction Location:Not applicable. IMPRESSION: 1. No large vessel occlusion or hemodynamically significant stenosis of the head or neck. 2. Patent left ICA stent. 3. Moderate stenosis of the left vertebral  artery origin. 4. No core infarct or ischemic penumbra on CT perfusion. Aortic Atherosclerosis (ICD10-I70.0). Electronically Signed   By: Orvan Falconer M.D.   On: 03/13/2023 11:26   CT HEAD CODE STROKE WO CONTRAST  Result Date: 03/13/2023 CLINICAL DATA:  Code stroke. Neuro deficit, acute, stroke suspected. EXAM: CT HEAD WITHOUT CONTRAST TECHNIQUE: Contiguous axial images were obtained from the base of the skull through the vertex without intravenous contrast. RADIATION DOSE REDUCTION: This exam was performed according to the departmental dose-optimization program which includes automated exposure control, adjustment of the mA and/or kV according to patient size and/or use of iterative reconstruction technique. COMPARISON:  Head CT 12/19/2022. FINDINGS: Brain: No acute hemorrhage. Unchanged background of severe chronic small-vessel disease with old  infarcts in the left MCA territory. No new loss of gray-white differentiation. No acute hydrocephalus or extra-axial collection. No mass effect or midline shift. Vascular: No hyperdense vessel or unexpected calcification. Skull: No calvarial fracture or suspicious bone lesion. Skull base is unremarkable. Sinuses/Orbits: No acute finding. Other: None. ASPECTS [facility] Stroke Program Early CT Score) - Ganglionic level infarction (caudate, lentiform nuclei, internal capsule, insula, M1-M3 cortex): 7 - Supraganglionic infarction (M4-M6 cortex): 3 Total score (0-10 with 10 being normal): 10 IMPRESSION: 1. No acute intracranial hemorrhage or evidence of acute large vessel territory infarct. ASPECT score is 10. 2. 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 Code stroke imaging results were communicated on 03/13/2023 at 10:48 am to provider Dr. Amada Jupiter via secure text paging. Electronically Signed   By: Orvan Falconer M.D.   On: 03/13/2023 10:50     Labs:   Basic Metabolic Panel: Recent Labs  Lab 03/13/23 1029 03/13/23 1034  NA 139 140  K 3.6 3.5  CL 102 104  CO2 26  --   GLUCOSE 108* 103*  BUN 15 16  CREATININE 1.07* 1.10*  CALCIUM 9.1  --    GFR Estimated Creatinine Clearance: 35.2 mL/min (A) (by C-G formula based on SCr of 1.1 mg/dL (H)). Liver Function Tests: Recent Labs  Lab 03/13/23 1029  AST 29  ALT 26  ALKPHOS 51  BILITOT 0.7  PROT 6.9  ALBUMIN 3.6   No results for input(s): "LIPASE", "AMYLASE" in the last 168 hours. No results for input(s): "AMMONIA" in the last 168 hours. Coagulation profile Recent Labs  Lab 03/13/23 1029  INR 0.9    CBC: Recent Labs  Lab 03/13/23 1029 03/13/23 1034  WBC 7.2  --   NEUTROABS 4.6  --   HGB 13.4 13.9  HCT 41.2 41.0  MCV 94.7  --   PLT 256  --    Cardiac Enzymes: No results for input(s): "CKTOTAL", "CKMB", "CKMBINDEX", "TROPONINI" in the last 168 hours. BNP: Invalid  input(s): "POCBNP" CBG: Recent Labs  Lab 03/15/23 0620 03/15/23 1234 03/15/23 1529 03/15/23 2104 03/16/23 0618  GLUCAP 122* 115* 132* 125* 87   D-Dimer No results for input(s): "DDIMER" in the last 72 hours. Hgb A1c Recent Labs    03/13/23 1522  HGBA1C 6.4*   Lipid Profile Recent Labs    03/14/23 0232  CHOL 112  HDL 48  LDLCALC 41  TRIG 117  CHOLHDL 2.3   Thyroid function studies No results for input(s): "TSH", "T4TOTAL", "T3FREE", "THYROIDAB" in the last 72 hours.  Invalid input(s): "FREET3" Anemia work up No results for input(s): "VITAMINB12", "FOLATE", "FERRITIN", "TIBC", "IRON", "RETICCTPCT" in the last 72 hours. Microbiology Recent Results (from the past 240 hour(s))  SARS Coronavirus 2 by RT PCR (hospital order, performed in [facility] hospital lab) *cepheid single result test* Anterior Nasal Swab     Status: None   Collection Time: 03/13/23  2:35 PM   Specimen: Anterior Nasal Swab  Result Value Ref Range Status   SARS Coronavirus 2 by RT PCR NEGATIVE NEGATIVE Final    Comment: Performed at [facility], [address], [place], [name] [address]     Discharge Instructions:   Discharge Instructions     Diet - low sodium heart healthy   Complete by: As directed    Diet Carb Modified   Complete by: As directed    Discharge instructions   Complete by: As directed    Follow-up with your primary care provider at the skilled nursing facility in 1 week.  Check blood work at that time.  Seek medical attention for worsening symptoms.   Increase activity slowly   Complete by: As directed       Allergies as of 03/16/2023       Reactions   Sulfa Antibiotics Other (See Comments)   Unknown reaction        Medication List     TAKE these medications    acetaminophen 325 MG tablet Commonly known as: TYLENOL Take 650 mg by mouth every 4 (four) hours as needed (discomfort, elevated temperature).   alum & mag hydroxide-simeth 200-200-20 MG/5  Susp Commonly known as: MAALOX/MYLANTA Apply 30 mLs topically every 4 (four) hours as needed (gas, indigestion, upset stomach).   aspirin 81 MG chewable tablet Chew 1 tablet (81 mg total) by mouth daily.   atorvastatin 80 MG tablet Commonly known as: LIPITOR Take 80 mg by mouth daily.   carvedilol 3.125 MG tablet Commonly known as: COREG Take 3.125 mg by mouth 2 (two) times daily.   cetirizine 5 MG tablet Commonly known as: ZYRTEC Take 5 mg by mouth at bedtime as needed for allergies.   chlorthalidone 25 MG tablet Commonly known as: HYGROTON Take 25 mg by mouth daily.   DEBROX OT Place 5 drops into both ears as needed (ear wax).   docusate sodium 100 MG capsule Commonly known as: COLACE Take 100 mg by mouth 2 (two) times daily.   escitalopram 10 MG tablet Commonly known as: LEXAPRO Take 10 mg by mouth daily.   ezetimibe 10 MG tablet Commonly known as: ZETIA Take 10 mg by mouth daily.   Glucose 15 GM/32ML Gel Take 1 packet by mouth as needed (low blood sugar).   isosorbide mononitrate 30 MG 24 hr tablet Commonly known as: IMDUR Take 30 mg by mouth daily.   Kaopectate 262 MG/15ML suspension Generic drug: bismuth subsalicylate Take 10 mLs by mouth as needed for diarrhea or loose stools.   losartan 100 MG tablet Commonly known as: COZAAR Take 100 mg by mouth daily.   magnesium chloride 64 MG Tbec SR tablet Commonly known as: SLOW-MAG Take 1 tablet by mouth daily.   Milk of Magnesia 7.75 % suspension Generic drug: magnesium hydroxide Take 30 mLs by mouth as needed (constipation).   nitroGLYCERIN 0.4 MG SL tablet Commonly known as: NITROSTAT Place 0.4 mg under the tongue every 5 (five) minutes as needed for chest pain.   nystatin powder Commonly known as: MYCOSTATIN/NYSTOP Apply 1 Application topically 2 (two) times daily as needed (skin breakdown).   ondansetron 4 MG tablet Commonly known as: ZOFRAN Take 4 mg by mouth 3 (three) times daily as needed  for nausea.  pantoprazole 40 MG tablet Commonly known as: PROTONIX Take 40 mg by mouth daily.   THEREMS PO Take 1 tablet by mouth daily.   ticagrelor 90 MG Tabs tablet Commonly known as: Brilinta Take 1 tablet (90 mg total) by mouth 2 (two) times daily.   Trigels-F Forte Caps capsule Generic drug: Fe Fum-Vit C-Vit B12-FA Take 1 capsule by mouth 2 (two) times daily.   Tussin DM 10-100 MG/5ML liquid Generic drug: dextromethorphan-guaiFENesin Take 10 mLs by mouth every 4 (four) hours as needed for cough.   Vitamin D (Ergocalciferol) 1.25 MG (50000 UNIT) Caps capsule Commonly known as: DRISDOL Take 1 capsule (50,000 Units total) by mouth every 7 (seven) days.          Time coordinating discharge: 39 minutes  Signed:     [facility] 03/16/2023, 9:45 AM

## 2023-03-16 NOTE — Plan of Care (Signed)
  Problem: Education: Goal: Knowledge of disease or condition will improve 03/16/2023 0553 by Charlynn Grimes, RN Outcome: Progressing 03/15/2023 2325 by Charlynn Grimes, RN Outcome: Progressing   Problem: Health Behavior/Discharge Planning: Goal: Ability to manage health-related needs will improve Outcome: Progressing

## 2023-03-21 ENCOUNTER — Encounter: Payer: Self-pay | Admitting: Student

## 2023-03-21 NOTE — Progress Notes (Unsigned)
Provider:  Dr. Earnestine Mealing Location:  Other [place].  Nursing Home Room Number: [facility] [address] Place of Service:  SNF (31)  PCP: Earnestine Mealing, MD Patient Care Team: Earnestine Mealing, MD as PCP - General [facility] Medicine)  Extended Emergency Contact Information Primary Emergency Contact: [facility] Address: [address], [name] [address] [place] Home Phone: [phone] Relation: Daughter Secondary Emergency Contact: [facility] Mobile Phone: [phone] Relation: Sister  Code Status: Full Code Goals of Care: Advanced Directive information    03/22/2023    8:38 AM  Advanced Directives  Does Patient Have a Medical Advance Directive? Yes  Type of Estate agent of [place];Living will  Does patient want to make changes to medical advance directive? No - Patient declined  Copy of Healthcare Power of Attorney in Chart? Yes - validated most recent copy scanned in chart (See row information)    Chief Complaint  Patient presents with   New Admit To SNF    Admission.     HPI: Patient is a 82 y.o. female seen today for admission to [facility] after recent admission for facial droop and weakness. CT scan negative and MRI showed left temporal and basal ganglia encephalomalacia. Labs were unremarkable and patient at Echo with negative bubble study and normal EF. No seizures.   She doesn't remember everything about why she had to go to the hospital. She had some trouble while at the hospital communicating. She fell herShe is alert and oriented to self and location, time and reason for being here. She has been eating okay, drinking fine and having good bowel movements.   When asked about her desire for resuscitation, she says I don't want that, cause I don't think I need it. I want you to try it if I need it.    Past Medical History:  Diagnosis Date   Anginal pain (HCC)    Aortic atherosclerosis    Cancer (HCC)     Carotid artery stenosis 02/01/2010   a.) Doppler 02/01/2010: 72% LICA and 60% RICA. b.) Doppler 12/30/2014: >70% LICA and 70% RICA. c.) Doppler 10/25/16: >70% Bilater ICAs   Chronic bilateral low back pain with left-sided sciatica    CKD (chronic kidney disease), stage III (HCC)    Coronary artery disease    a.) LHC 12/24/2014 -> small LAD system; diffuse CTO of LAD. 60% RCA. LM and LCx with no sig disease. no intervention; med mgmt.   Degenerative arthritis    Diastolic dysfunction    a.) TTE 09/20/2018: EF 55%, G1DD, mild LAE and RVE, triv-mild panvalvular regurgitation.   Dysarthria    GERD (gastroesophageal reflux disease)    Hepatic steatosis    History of kidney stones    HLD (hyperlipidemia)    Hypertension    Kidney stone    Lumbar radiculopathy    Major depression in remission (HCC)    PVD (peripheral vascular disease) (HCC)    Renal cyst, right 06/02/2015   a.) CT 06/02/2015 --> 4.4 cm RIGHT renal mass; MRI recommended. b.) MRI 06/16/2015 --> 3.8 x 4.2 x 4.0 cm proteinaceous/hemmorhagic cyst.   Subclavian steal syndrome    a.) LHC 12/24/2014 --> tight eccentric 90% ostial stenosis with damping.   T2DM (type 2 diabetes mellitus) (HCC)    Past Surgical History:  Procedure Laterality Date   ABDOMINAL HYSTERECTOMY     AUGMENTATION MAMMAPLASTY Bilateral 1980   BACK SURGERY  09/2018   BICEPT TENODESIS  06/10/2021   Procedure: BICEPS TENODESIS;  Surgeon: Christena Flake, MD;  Location: [facility] ORS;  Service: Orthopedics;;   CARDIAC CATHETERIZATION Left 12/24/2014   Procedure: LEFT HEARD CATHETERIZATION; Location: [facility]; Surgeon: Ander Purpura, MD   CHOLECYSTECTOMY     COLONOSCOPY N/A 02/11/2016   Procedure: COLONOSCOPY;  Surgeon: Scot Jun, MD;  Location: [facility] ENDOSCOPY;  Service: Endoscopy;  Laterality: N/A;   IR CT HEAD LTD  11/16/2021   IR CT HEAD LTD  11/16/2021   IR INTRAVSC STENT CERV CAROTID W/O EMB-PROT MOD SED INC ANGIO  11/18/2021   IR PERCUTANEOUS ART  THROMBECTOMY/INFUSION INTRACRANIAL INC DIAG ANGIO  11/16/2021   RADIOLOGY WITH ANESTHESIA N/A 11/16/2021   Procedure: IR WITH ANESTHESIA- CODE STROKE;  Surgeon: Radiologist, Medication, MD;  Location: [facility] OR;  Service: Radiology;  Laterality: N/A;   REVERSE SHOULDER ARTHROPLASTY Right 06/10/2021   Procedure: REVERSE SHOULDER ARTHROPLASTY;  Surgeon: Christena Flake, MD;  Location: [facility] ORS;  Service: Orthopedics;  Laterality: Right;    reports that she has never smoked. She has never been exposed to tobacco smoke. She has never used smokeless tobacco. She reports that she does not drink alcohol and does not use drugs. Social History   Socioeconomic History   Marital status: Married    Spouse name: Not on file   Number of children: Not on file   Years of education: Not on file   Highest education level: Not on file  Occupational History   Not on file  Tobacco Use   Smoking status: Never    Passive exposure: Never   Smokeless tobacco: Never  Vaping Use   Vaping status: Never Used  Substance and Sexual Activity   Alcohol use: Never   Drug use: Never   Sexual activity: Not Currently  Other Topics Concern   Not on file  Social History Narrative   Not on file   Social Determinants of Health   Financial Resource Strain: Not on file  Food Insecurity: No Food Insecurity (03/14/2023)   Hunger Vital Sign    Worried About Running Out of Food in the Last Year: Never true    Ran Out of Food in the Last Year: Never true  Transportation Needs: No Transportation Needs (03/14/2023)   PRAPARE - Administrator, Civil Service (Medical): No    Lack of Transportation (Non-Medical): No  Physical Activity: Not on file  Stress: Not on file  Social Connections: Not on file  Intimate Partner Violence: Not At Risk (03/14/2023)   Humiliation, Afraid, Rape, and Kick questionnaire    Fear of Current or Ex-Partner: No    Emotionally Abused: No    Physically Abused: No    Sexually Abused: No     Functional Status Survey:    Family History  Problem Relation Age of Onset   Heart attack Mother    Heart disease Mother    Heart attack Father    Heart disease Father    Breast cancer Neg Hx     Health Maintenance  Topic Date Due   Medicare Annual Wellness (AWV)  Never done   FOOT EXAM  Never done   OPHTHALMOLOGY EXAM  Never done   Diabetic kidney evaluation - Urine ACR  Never done   DTaP/Tdap/Td (1 - Tdap) Never done   DEXA SCAN  Never done   COVID-19 Vaccine (9 - 2023-24 season) 04/08/2022   INFLUENZA VACCINE  03/09/2023   HEMOGLOBIN A1C  09/13/2023   Diabetic kidney evaluation -  eGFR measurement  03/12/2024   Pneumonia Vaccine 72+ Years old  Completed   Zoster Vaccines- Shingrix  Completed   HPV VACCINES  Aged Out    Allergies  Allergen Reactions   Sulfa Antibiotics Other (See Comments)    Unknown reaction    Outpatient Encounter Medications as of 03/22/2023  Medication Sig   acetaminophen (TYLENOL) 325 MG tablet Take 650 mg by mouth every 4 (four) hours as needed (discomfort, elevated temperature).   alum & mag hydroxide-simeth (MAALOX/MYLANTA) 200-200-20 MG/5 SUSP Apply 30 mLs topically every 4 (four) hours as needed (gas, indigestion, upset stomach).   aspirin 81 MG chewable tablet Chew 1 tablet (81 mg total) by mouth daily.   atorvastatin (LIPITOR) 80 MG tablet Take 80 mg by mouth daily.   bismuth subsalicylate (KAOPECTATE) 262 MG/15ML suspension Take 10 mLs by mouth as needed for diarrhea or loose stools.   Carbamide Peroxide (DEBROX OT) Place 5 drops into both ears as needed (ear wax).   carvedilol (COREG) 3.125 MG tablet Take 3.125 mg by mouth 2 (two) times daily.   cetirizine (ZYRTEC) 5 MG tablet Take 5 mg by mouth at bedtime as needed for allergies.   chlorthalidone (HYGROTON) 25 MG tablet Take 25 mg by mouth daily.   dextromethorphan-guaiFENesin (TUSSIN DM) 10-100 MG/5ML liquid Take 10 mLs by mouth every 4 (four) hours as needed for cough.   docusate  sodium (COLACE) 100 MG capsule Take 100 mg by mouth 2 (two) times daily.   escitalopram (LEXAPRO) 10 MG tablet Take 10 mg by mouth daily.   ezetimibe (ZETIA) 10 MG tablet Take 10 mg by mouth daily.   Fe Fum-Vit C-Vit B12-FA (TRIGELS-F FORTE) CAPS capsule Take 1 capsule by mouth 2 (two) times daily.   Glucose 15 GM/32ML GEL Take 1 packet by mouth as needed (low blood sugar).   isosorbide mononitrate (IMDUR) 30 MG 24 hr tablet Take 30 mg by mouth daily.   losartan (COZAAR) 100 MG tablet Take 100 mg by mouth daily.    magnesium chloride (SLOW-MAG) 64 MG TBEC SR tablet Take 1 tablet by mouth daily.   magnesium hydroxide (MILK OF MAGNESIA) 400 MG/5ML suspension Take 30 mLs by mouth as needed (constipation).   Multiple Vitamin (THEREMS PO) Take 1 tablet by mouth daily.   nitroGLYCERIN (NITROSTAT) 0.4 MG SL tablet Place 0.4 mg under the tongue every 5 (five) minutes as needed for chest pain.   nystatin (MYCOSTATIN/NYSTOP) powder Apply 1 Application topically 2 (two) times daily as needed (skin breakdown).   ondansetron (ZOFRAN) 4 MG tablet Take 4 mg by mouth 3 (three) times daily as needed for nausea.   pantoprazole (PROTONIX) 40 MG tablet Take 40 mg by mouth daily.   ticagrelor (BRILINTA) 90 MG TABS tablet Take 1 tablet (90 mg total) by mouth 2 (two) times daily.   Vitamin D, Ergocalciferol, (DRISDOL) 1.25 MG (50000 UNIT) CAPS capsule Take 1 capsule (50,000 Units total) by mouth every 7 (seven) days.   No facility-administered encounter medications on file as of 03/22/2023.    Review of Systems  Vitals:   03/22/23 0828 03/22/23 0839  BP: (!) 164/73 121/69  Pulse: 71   Resp: 18   Temp: 98.2 F (36.8 C)   SpO2: 93%   Weight: 147 lb (66.7 kg)   Height: 5\' 3"  (1.6 m)    Body mass index is 26.04 kg/m. Physical Exam Vitals reviewed.  Constitutional:      Appearance: Normal appearance.     Comments: dysarthric  Cardiovascular:     Rate and Rhythm: Normal rate.     Pulses: Normal pulses.   Pulmonary:     Effort: Pulmonary effort is normal.  Abdominal:     General: Abdomen is flat.  Musculoskeletal:        General: Normal range of motion.  Skin:    General: Skin is warm.  Neurological:     Mental Status: She is alert and oriented to person, place, and time.     Labs reviewed: Basic Metabolic Panel: Recent Labs    12/19/22 2251 12/20/22 0450 12/21/22 0518 03/13/23 1029 03/13/23 1034  NA  --  139 139 139 140  K  --  3.2* 3.8 3.6 3.5  CL  --  109 110 102 104  CO2  --  23 23 26   --   GLUCOSE  --  111* 114* 108* 103*  BUN  --  29* 26* 15 16  CREATININE  --  1.28* 1.15* 1.07* 1.10*  CALCIUM  --  7.8* 7.8* 9.1  --   MG 1.4*  --   --   --   --    Liver Function Tests: Recent Labs    12/20/22 0450 03/13/23 1029  AST 17 29  ALT 15 26  ALKPHOS 49 51  BILITOT 0.8 0.7  PROT 6.1* 6.9  ALBUMIN 3.3* 3.6   No results for input(s): "LIPASE", "AMYLASE" in the last 8760 hours. No results for input(s): "AMMONIA" in the last 8760 hours. CBC: Recent Labs    12/19/22 1325 12/19/22 2251 12/20/22 0450 12/21/22 0518 03/02/23 0000 03/13/23 1029 03/13/23 1034  WBC 9.6  --  11.0* 9.5 8.0 7.2  --   NEUTROABS 7.0  --   --   --  4,992.00 4.6  --   HGB 11.4*   < > 9.7* 8.6* 13.0 13.4 13.9  HCT 34.3*   < > 30.9* 26.4* 40 41.2 41.0  MCV 91.7  --  96.3 93.0  --  94.7  --   PLT 241  --  197 210 249 256  --    < > = values in this interval not displayed.   Cardiac Enzymes: Recent Labs    12/19/22 1512  CKTOTAL 150   BNP: Invalid input(s): "POCBNP" Lab Results  Component Value Date   HGBA1C 6.4 (H) 03/13/2023   No results found for: "TSH" Lab Results  Component Value Date   VITAMINB12 748 03/02/2023   No results found for: "FOLATE" Lab Results  Component Value Date   IRON 72 03/02/2023   TIBC 306 03/02/2023   FERRITIN 29 03/02/2023    Imaging and Procedures obtained prior to SNF admission: EEG adult  Result Date: 03/14/2023 Jefferson Fuel, MD      03/14/2023  4:10 PM Routine EEG Report GINNI IXTA is a 82 y.o. female with a history of stroke who is undergoing an EEG to evaluate for seizures. Report: This EEG was acquired with electrodes placed according to the International 10-20 electrode system (including Fp1, Fp2, F3, F4, C3, C4, P3, P4, O1, O2, T3, T4, T5, T6, A1, A2, Fz, Cz, Pz). The following electrodes were missing or displaced: none. The occipital dominant rhythm was 8.5 Hz. This activity is reactive to stimulation. Drowsiness was manifested by background fragmentation; deeper stages of sleep were not identified. There was focal slowing over the left temporal region. There were no interictal epileptiform discharges. There were no electrographic seizures identified. Photic stimulation and hyperventilation were not performed. Impression  and clinical correlation: This EEG was obtained while awake and drowsy and is abnormal due to focal slowing over the region of her prior stroke. No epileptiform abnormalities were seen during this recording. Bing Neighbors, MD [facility] [phone] If 7pm- 7am, please page neurology on call as listed in AMION.   ECHOCARDIOGRAM COMPLETE BUBBLE STUDY  Result Date: 03/14/2023    ECHOCARDIOGRAM REPORT   Patient Name:   JANAKI SISLER Date of Exam: 03/14/2023 Medical Rec #:  [id number]     Height:       63.0 in Accession #:    [id number]    Weight:       149.6 lb Date of Birth:  July 14, 1941     BSA:          1.709 m Patient Age:    82 years      BP:           146/58 mmHg Patient Gender: F             HR:           78 bpm. Exam Location:  Inpatient Procedure: 2D Echo, Cardiac Doppler, Color Doppler and Saline Contrast Bubble            Study Indications:    Stroke  History:        Patient has prior history of Echocardiogram examinations, most                 recent 01/20/2022. CAD, Stroke, Carotid Disease, PAD and CKD;                 Risk Factors:Diabetes, Dyslipidemia and Hypertension.  Sonographer:    Wallie Char Referring Phys: Sanda Klein, Kelby Fam  Sonographer Comments: Image acquisition challenging due to patient body habitus. IMPRESSIONS  1. Intracavitary gradient. Peak velocity 1.69 m/s. Peak gradient 11.4 mmHg. Left ventricular ejection fraction, by estimation, is 65 to 70%. The left ventricle has normal function. The left ventricle has no regional wall motion abnormalities. Left ventricular diastolic parameters are consistent with Grade I diastolic dysfunction (impaired relaxation).  2. Right ventricular systolic function is normal. The right ventricular size is normal. There is normal pulmonary artery systolic pressure.  3. The mitral valve is normal in structure. Trivial mitral valve regurgitation. No evidence of mitral stenosis.  4. The aortic valve is tricuspid. Aortic valve regurgitation is mild. No aortic stenosis is present.  5. The inferior vena cava is normal in size with greater than 50% respiratory variability, suggesting right atrial pressure of 3 mmHg.  6. Agitated saline contrast bubble study was negative, with no evidence of any interatrial shunt. FINDINGS  Left Ventricle: Intracavitary gradient. Peak velocity 1.69 m/s. Peak gradient 11.4 mmHg. Left ventricular ejection fraction, by estimation, is 65 to 70%. The left ventricle has normal function. The left ventricle has no regional wall motion abnormalities. The left ventricular internal cavity size was normal in size. There is no left ventricular hypertrophy. Left ventricular diastolic parameters are consistent with Grade I diastolic dysfunction (impaired relaxation). Indeterminate filling pressures. Right Ventricle: The right ventricular size is normal. No increase in right ventricular wall thickness. Right ventricular systolic function is normal. There is normal pulmonary artery systolic pressure. The tricuspid regurgitant velocity is 2.75 m/s, and  with an assumed right atrial pressure of 3 mmHg, the estimated right ventricular systolic  pressure is 33.2 mmHg. Left Atrium: Left atrial size was normal in size. Right Atrium: Right atrial size was normal in size. Pericardium:  There is no evidence of pericardial effusion. Mitral Valve: The mitral valve is normal in structure. Mild mitral annular calcification. Trivial mitral valve regurgitation. No evidence of mitral valve stenosis. MV peak gradient, 4.8 mmHg. The mean mitral valve gradient is 2.0 mmHg. Tricuspid Valve: The tricuspid valve is normal in structure. Tricuspid valve regurgitation is trivial. No evidence of tricuspid stenosis. Aortic Valve: The aortic valve is tricuspid. Aortic valve regurgitation is mild. No aortic stenosis is present. Aortic valve mean gradient measures 4.0 mmHg. Aortic valve peak gradient measures 7.4 mmHg. Aortic valve area, by VTI measures 1.33 cm. Pulmonic Valve: The pulmonic valve was normal in structure. Pulmonic valve regurgitation is mild to moderate. No evidence of pulmonic stenosis. Aorta: The aortic root is normal in size and structure. Venous: The inferior vena cava is normal in size with greater than 50% respiratory variability, suggesting right atrial pressure of 3 mmHg. IAS/Shunts: No atrial level shunt detected by color flow Doppler. Agitated saline contrast was given intravenously to evaluate for intracardiac shunting. Agitated saline contrast bubble study was negative, with no evidence of any interatrial shunt.  LEFT VENTRICLE PLAX 2D LVIDd:         3.70 cm     Diastology LVIDs:         2.50 cm     LV e' medial:    5.58 cm/s LV PW:         0.80 cm     LV E/e' medial:  11.5 LV IVS:        0.80 cm     LV e' lateral:   5.81 cm/s LVOT diam:     1.50 cm     LV E/e' lateral: 11.0 LV SV:         35 LV SV Index:   21 LVOT Area:     1.77 cm  LV Volumes (MOD) LV vol d, MOD A4C: 58.4 ml LV vol s, MOD A4C: 19.5 ml LV SV MOD A4C:     58.4 ml RIGHT VENTRICLE RV Basal diam:  2.70 cm RV S prime:     13.70 cm/s TAPSE (M-mode): 2.0 cm LEFT ATRIUM             Index         RIGHT ATRIUM          Index LA diam:        3.60 cm 2.11 cm/m   RA Area:     9.30 cm LA Vol (A2C):   36.1 ml 21.12 ml/m  RA Volume:   15.70 ml 9.19 ml/m LA Vol (A4C):   34.9 ml 20.42 ml/m LA Biplane Vol: 36.2 ml 21.18 ml/m  AORTIC VALVE AV Area (Vmax):    1.25 cm AV Area (Vmean):   1.22 cm AV Area (VTI):     1.33 cm AV Vmax:           136.00 cm/s AV Vmean:          97.700 cm/s AV VTI:            0.265 m AV Peak Grad:      7.4 mmHg AV Mean Grad:      4.0 mmHg LVOT Vmax:         96.50 cm/s LVOT Vmean:        67.300 cm/s LVOT VTI:          0.200 m LVOT/AV VTI ratio: 0.75  AORTA Ao Root diam: 2.80 cm Ao Asc diam:  3.00 cm  MITRAL VALVE                TRICUSPID VALVE MV Area (PHT): 2.43 cm     TR Peak grad:   30.2 mmHg MV Area VTI:   1.51 cm     TR Vmax:        275.00 cm/s MV Peak grad:  4.8 mmHg MV Mean grad:  2.0 mmHg     SHUNTS MV Vmax:       1.10 m/s     Systemic VTI:  0.20 m MV Vmean:      68.0 cm/s    Systemic Diam: 1.50 cm MV Decel Time: 312 msec MV E velocity: 64.10 cm/s MV A velocity: 102.00 cm/s MV E/A ratio:  0.63 Chilton Si MD Electronically signed by Chilton Si MD Signature Date/Time: 03/14/2023/3:26:48 PM    Final    MR BRAIN WO CONTRAST  Result Date: 03/13/2023 CLINICAL DATA:  Acute neurologic deficit EXAM: MRI HEAD WITHOUT CONTRAST TECHNIQUE: Multiplanar, multiecho pulse sequences of the brain and surrounding structures were obtained without intravenous contrast. COMPARISON:  11/17/2021 FINDINGS: Brain: No acute infarct, mass effect or extra-axial collection. No acute or chronic hemorrhage. There is multifocal hyperintense T2-weighted signal within the white matter. Generalized volume loss. Anterior left temporal lobe and left basal ganglia encephalomalacia. The midline structures are normal. Vascular: Major flow voids are preserved. Skull and upper cervical spine: Normal calvarium and skull base. Visualized upper cervical spine and soft tissues are normal. Sinuses/Orbits:No  paranasal sinus fluid levels or advanced mucosal thickening. No mastoid or middle ear effusion. Normal orbits. IMPRESSION: 1. No acute intracranial abnormality. 2. Anterior left temporal lobe and left basal ganglia encephalomalacia. 3. Chronic small vessel ischemia and volume loss. Electronically Signed   By: Deatra Robinson M.D.   On: 03/13/2023 20:21   DG Chest Portable 1 View  Result Date: 03/13/2023 CLINICAL DATA:  ams EXAM: PORTABLE CHEST 1 VIEW COMPARISON:  CXR 11/16/21 FINDINGS: No pleural effusion. No pneumothorax. No focal airspace opacity. Normal cardiac and mediastinal contours. No radiographically apparent displaced rib fractures. Visualized upper abdomen is notable for surgical clips in the right upper quadrant. Bilateral breast implants. Right-sided shoulder arthroplasty. IMPRESSION: No focal airspace opacity Electronically Signed   By: Lorenza Cambridge M.D.   On: 03/13/2023 15:37   CT ANGIO HEAD NECK W WO CM W PERF (CODE STROKE)  Result Date: 03/13/2023 CLINICAL DATA:  Neuro deficit, acute, stroke suspected. EXAM: CT ANGIOGRAPHY HEAD AND NECK CT PERFUSION BRAIN TECHNIQUE: Multidetector CT imaging of the head and neck was performed using the standard protocol during bolus administration of intravenous contrast. Multiplanar CT image reconstructions and MIPs were obtained to evaluate the vascular anatomy. Carotid stenosis measurements (when applicable) are obtained utilizing NASCET criteria, using the distal internal carotid diameter as the denominator. Multiphase CT imaging of the brain was performed following IV bolus contrast injection. Subsequent parametric perfusion maps were calculated using RAPID software. RADIATION DOSE REDUCTION: This exam was performed according to the departmental dose-optimization program which includes automated exposure control, adjustment of the mA and/or kV according to patient size and/or use of iterative reconstruction technique. CONTRAST:  OMNIPAQUE IOHEXOL 350  MG/ML SOLN COMPARISON:  Head CT 03/13/2023. CTA head/neck and CT brain perfusion 11/16/2021. FINDINGS: CTA NECK FINDINGS Aortic arch: Two-vessel arch configuration with common origin of the right brachiocephalic and left common carotid arteries. Atherosclerotic calcifications of the aortic arch and arch vessel origins. Arch vessel origins are patent. Right carotid system: No evidence of dissection,  stenosis (50% or greater) or occlusion. Densely calcified plaque of the right carotid bulb and proximal right cervical ICA. Left carotid system: Mixed plaque results in mild stenosis of the distal left CCA. Patent left ICA stent. Vertebral arteries: Densely calcified plaque results in moderate stenosis of the left vertebral artery origin. Skeleton: Unremarkable. Other neck: Unremarkable. Upper chest: Unremarkable. Review of the MIP images confirms the above findings CTA HEAD FINDINGS Anterior circulation: Calcified plaque along the carotid siphons without hemodynamically significant stenosis. The proximal ACAs and MCAs are patent without stenosis or aneurysm. Distal branches are symmetric. Posterior circulation: Normal basilar artery. The SCAs, AICAs and PICAs are patent proximally. The PCAs are patent proximally without stenosis or aneurysm. Distal branches are symmetric. Venous sinuses: As permitted by contrast timing, patent. Anatomic variants: None. Review of the MIP images confirms the above findings CT Brain Perfusion Findings: ASPECTS: 10 CBF (<30%) Volume: 0mL Perfusion (Tmax>6.0s) volume: 0mL Mismatch Volume: 0mL Infarction Location:Not applicable. IMPRESSION: 1. No large vessel occlusion or hemodynamically significant stenosis of the head or neck. 2. Patent left ICA stent. 3. Moderate stenosis of the left vertebral artery origin. 4. No core infarct or ischemic penumbra on CT perfusion. Aortic Atherosclerosis (ICD10-I70.0). Electronically Signed   By: Orvan Falconer M.D.   On: 03/13/2023 11:26   CT HEAD CODE  STROKE WO CONTRAST  Result Date: 03/13/2023 CLINICAL DATA:  Code stroke. Neuro deficit, acute, stroke suspected. EXAM: CT HEAD WITHOUT CONTRAST TECHNIQUE: Contiguous axial images were obtained from the base of the skull through the vertex without intravenous contrast. RADIATION DOSE REDUCTION: This exam was performed according to the departmental dose-optimization program which includes automated exposure control, adjustment of the mA and/or kV according to patient size and/or use of iterative reconstruction technique. COMPARISON:  Head CT 12/19/2022. FINDINGS: Brain: No acute hemorrhage. 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 No new loss of gray-white differentiation. No acute hydrocephalus or extra-axial collection. No mass effect or midline shift. Vascular: No hyperdense vessel or unexpected calcification. Skull: No calvarial fracture or suspicious bone lesion. Skull base is unremarkable. Sinuses/Orbits: No acute finding. Other: None. ASPECTS [facility] Stroke Program Early CT Score) - Ganglionic level infarction (caudate, lentiform nuclei, internal capsule, insula, M1-M3 cortex): 7 - Supraganglionic infarction (M4-M6 cortex): 3 Total score (0-10 with 10 being normal): 10 IMPRESSION: 1. No acute intracranial hemorrhage or evidence of acute large vessel territory infarct. ASPECT score is 10. 2. 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 Code stroke imaging results were communicated on 03/13/2023 at 10:48 am to provider Dr. Amada Jupiter via secure text paging. Electronically Signed   By: Orvan Falconer M.D.   On: 03/13/2023 10:50    Assessment/Plan Right hemiparesis (HCC)  Carotid artery disease, unspecified laterality, unspecified type (HCC), Chronic  Major depressive disorder, single episode, moderate (HCC), Chronic  Benign hypertension with CKD (chronic kidney disease) stage III  (HCC)  History of stroke  Major depression in remission (HCC)  Vitamin D deficiency  Controlled type 2 diabetes mellitus with stage 3 chronic kidney disease, without long-term current use of insulin (HCC)  Dysarthria  Gastroesophageal reflux disease without esophagitis  Peripheral vascular disease (HCC) Patient with history of stroke presented to hospital for code stroke due to dysarthria and right hemiparesis (facial droop) which is not congruent with her baseline stroke symptoms. MRI showed no acute abnormality except basal ganglia encephalomalacia. PT/OT recommend SNF. Right ankle brace in place. Continue  lipitor, ASA, and brilinta. DM diet controlled. GERD well-controlled with protonix. Depression well-controlled with lexapro. BP at goal range at this time. Continue clarifying goals of care. At this time concern for progress with therapy given patient's history of stroke and known impaired gait, falls, and high risk of falls. Will contact daughter to discuss concerns for current status and clarifications of goals of care.   Family/ staff Communication: Nursing  Labs/tests ordered: CBC BMP Magnesium

## 2023-03-22 ENCOUNTER — Encounter: Payer: Self-pay | Admitting: Student

## 2023-03-22 ENCOUNTER — Non-Acute Institutional Stay (SKILLED_NURSING_FACILITY): Payer: Medicare Other | Admitting: Student

## 2023-03-22 DIAGNOSIS — G8191 Hemiplegia, unspecified affecting right dominant side: Secondary | ICD-10-CM | POA: Diagnosis not present

## 2023-03-22 DIAGNOSIS — I129 Hypertensive chronic kidney disease with stage 1 through stage 4 chronic kidney disease, or unspecified chronic kidney disease: Secondary | ICD-10-CM | POA: Diagnosis not present

## 2023-03-22 DIAGNOSIS — E559 Vitamin D deficiency, unspecified: Secondary | ICD-10-CM

## 2023-03-22 DIAGNOSIS — K219 Gastro-esophageal reflux disease without esophagitis: Secondary | ICD-10-CM

## 2023-03-22 DIAGNOSIS — F321 Major depressive disorder, single episode, moderate: Secondary | ICD-10-CM

## 2023-03-22 DIAGNOSIS — I779 Disorder of arteries and arterioles, unspecified: Secondary | ICD-10-CM | POA: Diagnosis not present

## 2023-03-22 DIAGNOSIS — I739 Peripheral vascular disease, unspecified: Secondary | ICD-10-CM

## 2023-03-22 DIAGNOSIS — Z8673 Personal history of transient ischemic attack (TIA), and cerebral infarction without residual deficits: Secondary | ICD-10-CM

## 2023-03-22 DIAGNOSIS — F325 Major depressive disorder, single episode, in full remission: Secondary | ICD-10-CM

## 2023-03-22 DIAGNOSIS — N183 Chronic kidney disease, stage 3 unspecified: Secondary | ICD-10-CM

## 2023-03-22 DIAGNOSIS — E1122 Type 2 diabetes mellitus with diabetic chronic kidney disease: Secondary | ICD-10-CM

## 2023-03-22 DIAGNOSIS — R471 Dysarthria and anarthria: Secondary | ICD-10-CM

## 2023-03-24 ENCOUNTER — Telehealth (INDEPENDENT_AMBULATORY_CARE_PROVIDER_SITE_OTHER): Payer: Medicare Other | Admitting: Student

## 2023-03-24 DIAGNOSIS — G9389 Other specified disorders of brain: Secondary | ICD-10-CM | POA: Diagnosis not present

## 2023-03-24 NOTE — Telephone Encounter (Signed)
Location Provider: [facility] Office Location of HCPOA: Home  Spoke with patient's daughter regarding patient's current health status.   She has had significant decline with the hospitalization. She was seeing significant improvement over the 2.5 weeks of being at [facility]. She has been sad about it.   She is seeing that she is having to repeat more things and so is her husband. She is still able to do her finances at this time. It's unclear what her cognitive status will be as there may be an element of delirium after hospitalization. Discussed time for improvements with delirium is ~2 months and folks sometimes never reach previous baseline. It will be discussion with [facility] and PT to determine patient's safety in return to [facility].   Isaiah Serge will be gone between August 28 and September 16. Her family will be the contact during that time.   Discussed that at this time it is appropriate for HCPOA to be activated as patient is not clearly communicating regarding her needs medically. Will reassess cognitive function at the 25mo mark ~ 04/18/23 to determine if she still scores 18/30 and can do additional testing periodically for progress. Encouraged access to mychart and directions were given for patient's family to gain access.   I spent >21 minutes discussing patient's current status, goals of care, and plan of care moving forward.   Coralyn Helling, MD, [facility] [phone]

## 2023-05-01 ENCOUNTER — Non-Acute Institutional Stay: Payer: Medicare Other | Admitting: Student

## 2023-05-01 DIAGNOSIS — W19XXXD Unspecified fall, subsequent encounter: Secondary | ICD-10-CM

## 2023-05-01 DIAGNOSIS — F321 Major depressive disorder, single episode, moderate: Secondary | ICD-10-CM

## 2023-05-01 DIAGNOSIS — N183 Chronic kidney disease, stage 3 unspecified: Secondary | ICD-10-CM

## 2023-05-01 DIAGNOSIS — I779 Disorder of arteries and arterioles, unspecified: Secondary | ICD-10-CM

## 2023-05-01 DIAGNOSIS — I129 Hypertensive chronic kidney disease with stage 1 through stage 4 chronic kidney disease, or unspecified chronic kidney disease: Secondary | ICD-10-CM

## 2023-05-01 DIAGNOSIS — G8191 Hemiplegia, unspecified affecting right dominant side: Secondary | ICD-10-CM | POA: Diagnosis not present

## 2023-05-01 DIAGNOSIS — Z8673 Personal history of transient ischemic attack (TIA), and cerebral infarction without residual deficits: Secondary | ICD-10-CM

## 2023-05-01 DIAGNOSIS — F0390 Unspecified dementia without behavioral disturbance: Secondary | ICD-10-CM

## 2023-05-01 DIAGNOSIS — G9389 Other specified disorders of brain: Secondary | ICD-10-CM | POA: Diagnosis not present

## 2023-05-04 ENCOUNTER — Encounter: Payer: Self-pay | Admitting: Student

## 2023-05-04 NOTE — Progress Notes (Signed)
Location:  Other Nursing Home Room Number: [facility] Place of Service:  ALF [phone]) Provider:  Ander Gaster, Benetta Spar, MD  Patient Care Team: Earnestine Mealing, MD as PCP - General [facility] Medicine)  Extended Emergency Contact Information Primary Emergency Contact: [facility] Address: [place], [name] [address] [place] Home Phone: [phone] Relation: Daughter Secondary Emergency Contact: [facility] Mobile Phone: [phone] Relation: Sister  Code Status:  Full Code Goals of care: Advanced Directive information    03/22/2023    8:38 AM  Advanced Directives  Does Patient Have a Medical Advance Directive? Yes  Type of Estate agent of [place];Living will  Does patient want to make changes to medical advance directive? No - Patient declined  Copy of Healthcare Power of Attorney in Chart? Yes - validated most recent copy scanned in chart (See row information)     No chief complaint on file.   HPI:  Pt is a 82 y.o. female seen today for an acute visit for ADMISSION to [facility]. Patient was recently in skilled nursing after hospitalization for increased weakness. Patient is now recovering and feeling much better. Continues to work with physical therapy as she is hoping to have continued improvement. Eating well. Denies concerns at this time. She is watching "suits" and states she likes this show.    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 b.) Doppler 12/30/2014: >70% LICA and 70% RICA. c.) Doppler 10/25/16: >70% Bilater ICAs   Chronic bilateral low back pain with left-sided sciatica    CKD (chronic kidney disease), stage III (HCC)    Coronary artery disease    a.) LHC 12/24/2014 -> small LAD system; diffuse CTO of LAD. 60% RCA. LM and LCx with no sig disease. no  intervention; med mgmt.   Degenerative arthritis    Diastolic dysfunction    a.) TTE 09/20/2018: EF 55%, G1DD, mild LAE and RVE, triv-mild panvalvular regurgitation.   Dysarthria    GERD (gastroesophageal reflux disease)    Hepatic steatosis    History of kidney stones    HLD (hyperlipidemia)    Hypertension    Kidney stone    Lumbar radiculopathy    Major depression in remission (HCC)    PVD (peripheral vascular disease) (HCC)    Renal cyst, right 06/02/2015   a.) CT 06/02/2015 --> 4.4 cm RIGHT renal mass; MRI recommended. b.) MRI 06/16/2015 --> 3.8 x 4.2 x 4.0 cm proteinaceous/hemmorhagic cyst.   Subclavian steal syndrome    a.) LHC 12/24/2014 --> tight eccentric 90% ostial stenosis with damping.   T2DM (type 2 diabetes mellitus) [facility])    Past Surgical History:  Procedure Laterality Date   ABDOMINAL HYSTERECTOMY     AUGMENTATION MAMMAPLASTY Bilateral 1980   BACK SURGERY  09/2018   BICEPT TENODESIS  06/10/2021   Procedure: BICEPS TENODESIS;  Surgeon: Christena Flake, MD;  Location: [facility] ORS;  Service: Orthopedics;;   CARDIAC CATHETERIZATION Left 12/24/2014   Procedure: LEFT HEARD CATHETERIZATION; Location: [facility]; Surgeon: Ander Purpura, MD   CHOLECYSTECTOMY     COLONOSCOPY N/A 02/11/2016   Procedure: COLONOSCOPY;  Surgeon: Scot Jun, MD;  Location: [facility] ENDOSCOPY;  Service: Endoscopy;  Laterality: N/A;   IR CT HEAD LTD  11/16/2021   IR CT HEAD LTD  11/16/2021   IR INTRAVSC STENT CERV CAROTID W/O EMB-PROT MOD SED INC ANGIO  11/18/2021   IR PERCUTANEOUS ART THROMBECTOMY/INFUSION INTRACRANIAL INC DIAG ANGIO  11/16/2021   RADIOLOGY WITH ANESTHESIA N/A 11/16/2021   Procedure: IR WITH ANESTHESIA- CODE STROKE;  Surgeon: Radiologist, Medication, MD;  Location: [facility] OR;  Service: Radiology;  Laterality: N/A;   REVERSE SHOULDER ARTHROPLASTY Right 06/10/2021   Procedure: REVERSE SHOULDER ARTHROPLASTY;  Surgeon: Christena Flake, MD;  Location: [facility] ORS;  Service: Orthopedics;  Laterality: Right;     Allergies  Allergen Reactions   Sulfa Antibiotics Other (See Comments)    Unknown reaction    Outpatient Encounter Medications as of 05/01/2023  Medication Sig   acetaminophen (TYLENOL) 325 MG tablet Take 650 mg by mouth every 4 (four) hours as needed (discomfort, elevated temperature).   alum & mag hydroxide-simeth (MAALOX/MYLANTA) 200-200-20 MG/5 SUSP Apply 30 mLs topically every 4 (four) hours as needed (gas, indigestion, upset stomach).   aspirin 81 MG chewable tablet Chew 1 tablet (81 mg total) by mouth daily.   atorvastatin (LIPITOR) 80 MG tablet Take 80 mg by mouth daily.   bismuth subsalicylate (KAOPECTATE) 262 MG/15ML suspension Take 10 mLs by mouth as needed for diarrhea or loose stools.   Carbamide Peroxide (DEBROX OT) Place 5 drops into both ears as needed (ear wax).   carvedilol (COREG) 3.125 MG tablet Take 3.125 mg by mouth 2 (two) times daily.   cetirizine (ZYRTEC) 5 MG tablet Take 5 mg by mouth at bedtime as needed for allergies.   chlorthalidone (HYGROTON) 25 MG tablet Take 25 mg by mouth daily.   dextromethorphan-guaiFENesin (TUSSIN DM) 10-100 MG/5ML liquid Take 10 mLs by mouth every 4 (four) hours as needed for cough.   docusate sodium (COLACE) 100 MG capsule Take 100 mg by mouth 2 (two) times daily.   escitalopram (LEXAPRO) 10 MG tablet Take 10 mg by mouth daily.   ezetimibe (ZETIA) 10 MG tablet Take 10 mg by mouth daily.   Fe Fum-Vit C-Vit B12-FA (TRIGELS-F FORTE) CAPS capsule Take 1 capsule by mouth 2 (two) times daily.   Glucose 15 GM/32ML GEL Take 1 packet by mouth as needed (low blood sugar).   isosorbide mononitrate (IMDUR) 30 MG 24 hr tablet Take 30 mg by mouth daily.   losartan (COZAAR) 100 MG tablet Take 100 mg by mouth daily.    magnesium chloride (SLOW-MAG) 64 MG TBEC SR tablet Take 1 tablet by mouth daily.   magnesium hydroxide (MILK OF MAGNESIA) 400 MG/5ML suspension Take 30 mLs by mouth as needed (constipation).   Multiple Vitamin (THEREMS PO) Take 1  tablet by mouth daily.   nitroGLYCERIN (NITROSTAT) 0.4 MG SL tablet Place 0.4 mg under the tongue every 5 (five) minutes as needed for chest pain.   nystatin (MYCOSTATIN/NYSTOP) powder Apply 1 Application topically 2 (two) times daily as needed (skin breakdown).   ondansetron (ZOFRAN) 4 MG tablet Take 4 mg by mouth 3 (three) times daily as needed for nausea.   pantoprazole (PROTONIX) 40 MG tablet Take 40 mg by mouth daily.   ticagrelor (BRILINTA) 90 MG TABS tablet Take 1 tablet (90 mg total) by mouth 2 (two) times daily.   Vitamin D, Ergocalciferol, (DRISDOL) 1.25 MG (50000 UNIT) CAPS capsule Take 1 capsule (50,000 Units total) by mouth every 7 (seven) days.   No facility-administered encounter medications on file as of 05/01/2023.    Review of Systems  Immunization History  Administered Date(s) Administered   Influenza Inj Mdck Quad Pf  05/02/2018   Influenza Split 05/26/2015   Influenza, High Dose Seasonal PF 05/16/2019, 05/10/2020, 05/25/2021   Influenza-Unspecified 05/01/2012, 05/12/2013, 06/08/2016, 04/28/2017   Moderna Covid-19 Vaccine Bivalent Booster 38yrs & up 05/25/2021   PFIZER Comirnaty(Gray Top)Covid-19 Tri-Sucrose Vaccine 08/15/2019, 09/07/2019, 05/10/2020   PFIZER(Purple Top)SARS-COV-2 Vaccination 08/15/2019, 09/07/2019, 05/02/2020, 11/25/2020   Pneumococcal Conjugate-13 05/26/2015   Pneumococcal Polysaccharide-23 08/09/2011   Zoster Recombinant(Shingrix) 08/30/2018, 01/03/2019   Zoster, Live 08/09/2011   Pertinent  Health Maintenance Due  Topic Date Due   FOOT EXAM  Never done   OPHTHALMOLOGY EXAM  Never done   DEXA SCAN  Never done   INFLUENZA VACCINE  03/09/2023   HEMOGLOBIN A1C  09/13/2023      11/23/2021   11:00 AM 11/23/2021    8:00 PM 11/24/2021    9:00 AM 11/24/2021    8:10 PM 11/25/2021    8:00 AM  Fall Risk  (RETIRED) Patient Fall Risk Level Moderate fall risk Moderate fall risk Moderate fall risk Moderate fall risk Moderate fall risk   Functional Status  Survey:    Vitals:   05/01/23 1427  BP: 107/65  Pulse: 70  Resp: 16  Temp: 98.1 F (36.7 C)  SpO2: 98%  Weight: 149 lb (67.6 kg)   Body mass index is 26.39 kg/m. Physical Exam Constitutional:      Comments: dysarthric  Cardiovascular:     Rate and Rhythm: Normal rate.     Pulses: Normal pulses.  Pulmonary:     Effort: Pulmonary effort is normal.     Breath sounds: Normal breath sounds.  Abdominal:     General: Abdomen is flat.  Musculoskeletal:     Comments: Right lower extremity brace  Neurological:     Mental Status: She is alert and oriented to person, place, and time.     Labs reviewed: Recent Labs    12/19/22 2251 12/20/22 0450 12/21/22 0518 03/13/23 1029 03/13/23 1034  NA  --  139 139 139 140  K  --  3.2* 3.8 3.6 3.5  CL  --  109 110 102 104  CO2  --  23 23 26   --   GLUCOSE  --  111* 114* 108* 103*  BUN  --  29* 26* 15 16  CREATININE  --  1.28* 1.15* 1.07* 1.10*  CALCIUM  --  7.8* 7.8* 9.1  --   MG 1.4*  --   --   --   --    Recent Labs    12/20/22 0450 03/13/23 1029  AST 17 29  ALT 15 26  ALKPHOS 49 51  BILITOT 0.8 0.7  PROT 6.1* 6.9  ALBUMIN 3.3* 3.6   Recent Labs    12/19/22 1325 12/19/22 2251 12/20/22 0450 12/21/22 0518 03/02/23 0000 03/13/23 1029 03/13/23 1034  WBC 9.6  --  11.0* 9.5 8.0 7.2  --   NEUTROABS 7.0  --   --   --  4,992.00 4.6  --   HGB 11.4*   < > 9.7* 8.6* 13.0 13.4 13.9  HCT 34.3*   < > 30.9* 26.4* 40 41.2 41.0  MCV 91.7  --  96.3 93.0  --  94.7  --   PLT 241  --  197 210 249 256  --    < > = values in this interval not displayed.   No results found for: "TSH" Lab Results  Component Value Date   HGBA1C 6.4 (H) 03/13/2023   Lab Results  Component Value Date   CHOL  112 03/14/2023   HDL 48 03/14/2023   LDLCALC 41 03/14/2023   TRIG 117 03/14/2023   CHOLHDL 2.3 03/14/2023    Significant Diagnostic Results in last 30 days:  No results found.  Assessment/Plan 1. Encephalomalacia Major Neurocognitive  Disorder Noted on most recent brain imaging likely secondary to recent stroke. Continue supportive care. Continue statin therapy, asa. Of note, nursing recently noted forgotten medications and are now providing supportive care for medication management. MoCA 7/31 19/30 concerning for moderate level MND with support for meals and medications.   2. Carotid artery disease, unspecified laterality, unspecified type (HCC) Denies symptoms, continue ASA  3. Major depressive disorder, single episode, moderate (HCC) Denies decline at this time, however, some concern from family. Nursing aware and continues to monitor. Continue Lexpro at this time.   4. Right hemiparesis (HCC) Secondary to prior stroke, continues physical therapy 2x weekly. Continued risk of fall, life alert present.   5. Benign hypertension with CKD (chronic kidney disease) stage III (HCC) BP low today, will discontinue amlodipine and continue chlorthalidone at [facility] stime.   6. History of stroke Continue supportive care  7. Fall, subsequent encounter PT 2x weekly.     Family/ staff Communication: nursing  Labs/tests ordered:  BMP

## 2023-06-16 ENCOUNTER — Encounter: Payer: Self-pay | Admitting: Emergency Medicine

## 2023-06-16 ENCOUNTER — Other Ambulatory Visit: Payer: Self-pay

## 2023-06-16 ENCOUNTER — Emergency Department
Admission: EM | Admit: 2023-06-16 | Discharge: 2023-06-16 | Disposition: A | Discharge status: Home / Self Care | Payer: Medicare Other | Attending: Emergency Medicine | Admitting: Emergency Medicine

## 2023-06-16 ENCOUNTER — Emergency Department: Payer: Medicare Other

## 2023-06-16 DIAGNOSIS — I2510 Atherosclerotic heart disease of native coronary artery without angina pectoris: Secondary | ICD-10-CM | POA: Diagnosis not present

## 2023-06-16 DIAGNOSIS — S0003XA Contusion of scalp, initial encounter: Secondary | ICD-10-CM

## 2023-06-16 DIAGNOSIS — S60221A Contusion of right hand, initial encounter: Secondary | ICD-10-CM | POA: Diagnosis not present

## 2023-06-16 DIAGNOSIS — S0101XA Laceration without foreign body of scalp, initial encounter: Secondary | ICD-10-CM

## 2023-06-16 DIAGNOSIS — N189 Chronic kidney disease, unspecified: Secondary | ICD-10-CM | POA: Diagnosis not present

## 2023-06-16 DIAGNOSIS — I129 Hypertensive chronic kidney disease with stage 1 through stage 4 chronic kidney disease, or unspecified chronic kidney disease: Secondary | ICD-10-CM | POA: Diagnosis not present

## 2023-06-16 DIAGNOSIS — W19XXXA Unspecified fall, initial encounter: Secondary | ICD-10-CM

## 2023-06-16 DIAGNOSIS — E1122 Type 2 diabetes mellitus with diabetic chronic kidney disease: Secondary | ICD-10-CM | POA: Insufficient documentation

## 2023-06-16 DIAGNOSIS — S0990XA Unspecified injury of head, initial encounter: Secondary | ICD-10-CM | POA: Diagnosis present

## 2023-06-16 MED ORDER — ACETAMINOPHEN 500 MG PO TABS
500.0000 mg | ORAL_TABLET | Freq: Once | ORAL | Status: AC
  Administered 2023-06-16: 500 mg via ORAL
  Filled 2023-06-16: qty 1

## 2023-06-16 MED ORDER — LIDOCAINE-EPINEPHRINE-TETRACAINE (LET) TOPICAL GEL
3.0000 mL | Freq: Once | TOPICAL | Status: AC
  Administered 2023-06-16: 3 mL via TOPICAL
  Filled 2023-06-16: qty 3

## 2023-06-16 NOTE — ED Triage Notes (Signed)
Presents s/p fall via EMS  Unsure why she fell  The fall was unwitnessed   Hitting head on wall  Laceration noted to right side of head  Denies any other pain

## 2023-06-16 NOTE — Discharge Instructions (Addendum)
Follow-up with your primary care provider if any continued problems.  Return to the emergency department over the weekend if any urgent concerns to the scalp has staples in it.  These will need to be removed in approximately 7 days and can be done most likely at [facility] or any urgent care.  Tylenol if needed for pain.  Read the information about postconcussive syndrome even though her CT scan is negative for any intracranial abnormality.  Return again over the weekend if any urgent concerns.  Also follow-up with your primary care provider in regards to possible outpatient MRI imaging to evaluate osteopenia cervical and thoracic spine.

## 2023-06-16 NOTE — ED Provider Notes (Signed)
[facility] Provider Note    Event Date/Time   First MD Initiated Contact with Patient 06/16/23 [phone]     (approximate)   History   Fall (/)   HPI  Cassidy Hernandez is a 82 y.o. female presents to the ED via EMS after a fall that occurred this morning.  Patient reports she does not know why she fell and this was unwitnessed.  Patient lives at [facility] and states that she pressed her monitor to alert nursing staff that she had fallen.  She denies any pain, nausea, vomiting, visual changes or dizziness.  Has a history of hypertension, CAD, CKD, cancer, sciatica, GERD, diabetes type 2, history of CVA, right hemiparesis, PVD.     Physical Exam   Triage Vital Signs: ED Triage Vitals  Encounter Vitals Group     BP 06/16/23 0949 (!) 195/79     Systolic BP Percentile --      Diastolic BP Percentile --      Pulse Rate 06/16/23 0949 78     Resp 06/16/23 0949 18     Temp 06/16/23 0949 97.7 F (36.5 C)     Temp Source 06/16/23 0949 Oral     SpO2 06/16/23 0949 95 %     Weight 06/16/23 0944 149 lb 0.5 oz (67.6 kg)     Height 06/16/23 0944 5\' 3"  (1.6 m)     Head Circumference --      Peak Flow --      Pain Score 06/16/23 0944 5     Pain Loc --      Pain Education --      Exclude from Growth Chart --     Most recent vital signs: Vitals:   06/16/23 1411 06/16/23 1422  BP: (!) 188/80 (!) 180/84  Pulse:  80  Resp:  18  Temp:  98 F (36.7 C)  SpO2:  96%     General: Awake, no distress.  Alert, talkative, cooperative, answers questions appropriately.  Patient frequently called her daughter on her cell phone to speak with her and let her know what was going on with her during her care in the ED. CV:  Good peripheral perfusion.  Heart rate and rate rhythm. Resp:  Normal effort.  Lungs are clear bilaterally. Abd:  No distention.  Soft, nontender. Other:  PERRLA, EOMI's, nontender cervical spine to palpation posteriorly, thoracic or lumbar spine also was  nontender.  Patient is able to move upper and lower extremities.  There is some ecchymosis noted to the right hand dorsal aspect however patient is able to move all digits without any difficulty, skin is intact, capillary refills less than 3 seconds.  No deformity or difficulty with range of motion.  Patient does have a great deal of blood in her hair with thoughts that she most likely has a laceration to the right lateral scalp area.   ED Results / Procedures / Treatments   Labs (all labs ordered are listed, but only abnormal results are displayed) Labs Reviewed - No data to display   RADIOLOGY  CT head and cervical spine per radiology is negative for any acute intracranial abnormality or cervical fracture.diffusely heterogeneous appearance of the cervical and thoracic spine may be secondary to osteopenia.  MRI evaluation recommended.    PROCEDURES:  Critical Care performed:   Procedures   MEDICATIONS ORDERED IN ED: Medications  lidocaine-EPINEPHrine-tetracaine (LET) topical gel (3 mLs Topical Given 06/16/23 1330)  acetaminophen (TYLENOL) tablet 500 mg (  500 mg Oral Given 06/16/23 1417)     IMPRESSION / MDM / ASSESSMENT AND PLAN / ED COURSE  I reviewed the triage vital signs and the nursing notes.   Differential diagnosis includes, but is not limited to, head injury, skull fracture, laceration scalp, cervical injury, subluxation, fracture, contusion, sprain  82 year old female presents to the ED via EMS after a fall that occurred at [facility].  Patient is alert and talkative.  CT head and cervical spine were reassuring and patient was made aware.  It took a great deal of scrubbing to be able to get the blood out of her hair and evaluate her scalp laceration.  Staples were placed and patient was made aware that these would need to be removed in 7 days.  During her stay in the emergency department she was able to FaceTime with her daughter multiple times with her iPhone giving her  updates on her visit and also reporting that her daughter will come to the emergency department to pick her up and take her back to [facility].      Patient's presentation is most consistent with acute presentation with potential threat to life or bodily function.  FINAL CLINICAL IMPRESSION(S) / ED DIAGNOSES   Final diagnoses:  Laceration of scalp, initial encounter  Contusion of scalp, initial encounter  Contusion of right hand, initial encounter  Fall, initial encounter     Rx / DC Orders   ED Discharge Orders     None        Note:  This document was prepared using Dragon voice recognition software and may include unintentional dictation errors.   Tommi Rumps, PA-C 06/16/23 1541    Merwyn Katos, MD 06/16/23 [phone]

## 2023-06-20 ENCOUNTER — Encounter: Payer: Self-pay | Admitting: Nurse Practitioner

## 2023-06-20 ENCOUNTER — Non-Acute Institutional Stay: Payer: Medicare Other | Admitting: Nurse Practitioner

## 2023-06-20 DIAGNOSIS — G8191 Hemiplegia, unspecified affecting right dominant side: Secondary | ICD-10-CM | POA: Diagnosis not present

## 2023-06-20 DIAGNOSIS — S0101XD Laceration without foreign body of scalp, subsequent encounter: Secondary | ICD-10-CM | POA: Diagnosis not present

## 2023-06-20 DIAGNOSIS — S1093XD Contusion of unspecified part of neck, subsequent encounter: Secondary | ICD-10-CM | POA: Diagnosis not present

## 2023-06-20 DIAGNOSIS — S60221D Contusion of right hand, subsequent encounter: Secondary | ICD-10-CM

## 2023-06-20 NOTE — Progress Notes (Signed)
Location:  Other [place].  Nursing Home Room Number: [address] Place of Service:  ALF [phone]) Abbey Chatters, NP  PCP: Earnestine Mealing, MD  Patient Care Team: Earnestine Mealing, MD as PCP - General [facility] Medicine)  Extended Emergency Contact Information Primary Emergency Contact: Clovis Pu Address: [address], [name] [address] [place] Home Phone: [phone] Relation: Daughter Secondary Emergency Contact: [facility] Mobile Phone: [phone] Relation: Sister  Goals of care: Advanced Directive information    06/20/2023    1:01 PM  Advanced Directives  Does Patient Have a Medical Advance Directive? No  Would patient like information on creating a medical advance directive? No - Patient declined     Chief Complaint  Patient presents with   Acute Visit    ER Follow up    HPI:  Pt is a 82 y.o. female seen today for an acute visit for ER Follow up.  She fell backwards in her bathroom and hit her head on the side of the wall and hit her right hand. Went to ED for evaluation.  Had scalp laceration and contusion to neck and hand.  Continues to have bruising and tenderness but no pain at this time. 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   She completed PT last week    Past Medical History:  Diagnosis Date   Anginal pain (HCC)    Aortic atherosclerosis    Cancer (HCC)    Carotid artery stenosis 02/01/2010   a.) Doppler 02/01/2010: 72% LICA and 60% RICA. b.) Doppler 12/30/2014: >70% LICA and 70% RICA. c.) Doppler 10/25/16: >70% Bilater ICAs   Chronic bilateral low back pain with left-sided sciatica    CKD (chronic kidney disease), stage III (HCC)    Coronary artery disease    a.) LHC 12/24/2014 -> small LAD system; diffuse CTO of LAD. 60%  RCA. LM and LCx with no sig disease. no intervention; med mgmt.   Degenerative arthritis    Diastolic dysfunction    a.) TTE 09/20/2018: EF 55%, G1DD, mild LAE and RVE, triv-mild panvalvular regurgitation.   Dysarthria    GERD (gastroesophageal reflux disease)    Hepatic steatosis    History of kidney stones    HLD (hyperlipidemia)    Hypertension    Kidney stone    Lumbar radiculopathy    Major depression in remission (HCC)    PVD (peripheral vascular disease) (HCC)    Renal cyst, right 06/02/2015   a.) CT 06/02/2015 --> 4.4 cm RIGHT renal mass; MRI recommended. b.) MRI 06/16/2015 --> 3.8 x 4.2 x 4.0 cm proteinaceous/hemmorhagic cyst.   Subclavian steal syndrome    a.) LHC 12/24/2014 --> tight eccentric 90% ostial stenosis with damping.   T2DM (type 2 diabetes mellitus) [facility])    Past Surgical History:  Procedure Laterality Date   ABDOMINAL HYSTERECTOMY     AUGMENTATION MAMMAPLASTY Bilateral 1980   BACK SURGERY  09/2018   BICEPT TENODESIS  06/10/2021   Procedure: BICEPS TENODESIS;  Surgeon: Christena Flake, MD;  Location: [facility] ORS;  Service: Orthopedics;;   CARDIAC CATHETERIZATION Left 12/24/2014   Procedure: LEFT HEARD CATHETERIZATION; Location: [facility]; Surgeon: Ander Purpura, MD   CHOLECYSTECTOMY     COLONOSCOPY N/A 02/11/2016   Procedure: COLONOSCOPY;  Surgeon:  Scot Jun, MD;  Location: [facility] ENDOSCOPY;  Service: Endoscopy;  Laterality: N/A;   IR CT HEAD LTD  11/16/2021   IR CT HEAD LTD  11/16/2021   IR INTRAVSC STENT CERV CAROTID W/O EMB-PROT MOD SED INC ANGIO  11/18/2021   IR PERCUTANEOUS ART THROMBECTOMY/INFUSION INTRACRANIAL INC DIAG ANGIO  11/16/2021   RADIOLOGY WITH ANESTHESIA N/A 11/16/2021   Procedure: IR WITH ANESTHESIA- CODE STROKE;  Surgeon: Radiologist, Medication, MD;  Location: [facility] OR;  Service: Radiology;  Laterality: N/A;   REVERSE SHOULDER ARTHROPLASTY Right 06/10/2021   Procedure: REVERSE SHOULDER ARTHROPLASTY;  Surgeon: Christena Flake, MD;  Location: [facility] ORS;   Service: Orthopedics;  Laterality: Right;    Allergies  Allergen Reactions   Sulfa Antibiotics Other (See Comments)    Unknown reaction    Outpatient Encounter Medications as of 06/20/2023  Medication Sig   acetaminophen (TYLENOL) 325 MG tablet Take 650 mg by mouth every 4 (four) hours as needed (discomfort, elevated temperature).   alum & mag hydroxide-simeth (MAALOX/MYLANTA) 200-200-20 MG/5 SUSP Apply 30 mLs topically every 4 (four) hours as needed (gas, indigestion, upset stomach).   aspirin 81 MG chewable tablet Chew 1 tablet (81 mg total) by mouth daily.   atorvastatin (LIPITOR) 80 MG tablet Take 80 mg by mouth daily.   bismuth subsalicylate (KAOPECTATE) 262 MG/15ML suspension Take 10 mLs by mouth as needed for diarrhea or loose stools.   Carbamide Peroxide (DEBROX OT) Place 5 drops into both ears as needed (ear wax).   carvedilol (COREG) 3.125 MG tablet Take 3.125 mg by mouth 2 (two) times daily.   cetirizine (ZYRTEC) 5 MG tablet Take 5 mg by mouth at bedtime as needed for allergies.   chlorthalidone (HYGROTON) 25 MG tablet Take 25 mg by mouth daily.   dextromethorphan-guaiFENesin (TUSSIN DM) 10-100 MG/5ML liquid Take 10 mLs by mouth every 4 (four) hours as needed for cough.   docusate sodium (COLACE) 100 MG capsule Take 100 mg by mouth 2 (two) times daily.   escitalopram (LEXAPRO) 10 MG tablet Take 10 mg by mouth daily.   ezetimibe (ZETIA) 10 MG tablet Take 10 mg by mouth daily.   Fe Fum-Vit C-Vit B12-FA (TRIGELS-F FORTE) CAPS capsule Take 1 capsule by mouth 2 (two) times daily.   Glucose 15 GM/32ML GEL Take 1 packet by mouth as needed (low blood sugar).   isosorbide mononitrate (IMDUR) 30 MG 24 hr tablet Take 30 mg by mouth daily.   losartan (COZAAR) 100 MG tablet Take 100 mg by mouth daily.    magnesium chloride (SLOW-MAG) 64 MG TBEC SR tablet Take 1 tablet by mouth daily.   magnesium hydroxide (MILK OF MAGNESIA) 400 MG/5ML suspension Take 30 mLs by mouth as needed  (constipation).   Multiple Vitamin (THEREMS PO) Take 1 tablet by mouth daily.   nitroGLYCERIN (NITROSTAT) 0.4 MG SL tablet Place 0.4 mg under the tongue every 5 (five) minutes as needed for chest pain.   nystatin (MYCOSTATIN/NYSTOP) powder Apply 1 Application topically 2 (two) times daily as needed (skin breakdown).   ondansetron (ZOFRAN) 4 MG tablet Take 4 mg by mouth 3 (three) times daily as needed for nausea.   OXYGEN Inhale into the lungs. 2lpm for dyspnea or SOB   pantoprazole (PROTONIX) 40 MG tablet Take 40 mg by mouth daily.   ticagrelor (BRILINTA) 90 MG TABS tablet Take 1 tablet (90 mg total) by mouth 2 (two) times daily.   Vitamin D, Ergocalciferol, (DRISDOL) 1.25 MG (50000 UNIT) CAPS capsule Take 1  capsule (50,000 Units total) by mouth every 7 (seven) days.   No facility-administered encounter medications on file as of 06/20/2023.    Review of Systems  Constitutional:  Negative for chills and fever.  HENT:  Negative for tinnitus.   Respiratory:  Negative for cough and shortness of breath.   Cardiovascular:  Negative for chest pain, palpitations and leg swelling.  Gastrointestinal:  Negative for abdominal pain, constipation and diarrhea.  Genitourinary:  Negative for dysuria, frequency and urgency.  Musculoskeletal:  Negative for back pain and myalgias.  Skin:        Bruising noted  Neurological:  Positive for weakness (right sided hemipareisis). Negative for dizziness and headaches.    Immunization History  Administered Date(s) Administered   Influenza Inj Mdck Quad Pf 05/02/2018   Influenza Split 05/26/2015   Influenza, High Dose Seasonal PF 05/16/2019, 05/10/2020, 05/25/2021   Influenza-Unspecified 05/01/2012, 05/12/2013, 06/08/2016, 04/28/2017, 05/26/2023   Moderna Covid-19 Vaccine Bivalent Booster 53yrs & up 05/25/2021   PFIZER Comirnaty(Gray Top)Covid-19 Tri-Sucrose Vaccine 08/15/2019, 09/07/2019, 05/10/2020   PFIZER(Purple Top)SARS-COV-2 Vaccination 08/15/2019,  09/07/2019, 05/02/2020, 11/25/2020, 05/05/2023   Pneumococcal Conjugate-13 05/26/2015   Pneumococcal Polysaccharide-23 08/09/2011   Zoster Recombinant(Shingrix) 08/30/2018, 01/03/2019   Zoster, Live 08/09/2011   Pertinent  Health Maintenance Due  Topic Date Due   FOOT EXAM  Never done   OPHTHALMOLOGY EXAM  Never done   DEXA SCAN  Never done   HEMOGLOBIN A1C  09/13/2023   INFLUENZA VACCINE  Completed      11/23/2021   11:00 AM 11/23/2021    8:00 PM 11/24/2021    9:00 AM 11/24/2021    8:10 PM 11/25/2021    8:00 AM  Fall Risk  (RETIRED) Patient Fall Risk Level Moderate fall risk Moderate fall risk Moderate fall risk Moderate fall risk Moderate fall risk   Functional Status Survey:    Vitals:   06/20/23 1251 06/20/23 1302  BP: (!) 154/79 (!) 147/80  Pulse: 67   Resp: 17   Temp: 98 F (36.7 C)   SpO2: 93%   Weight: 149 lb 9.6 oz (67.9 kg)   Height: 5\' 3"  (1.6 m)    Body mass index is 26.5 kg/m. Physical Exam Constitutional:      General: She is not in acute distress.    Appearance: She is well-developed. She is not diaphoretic.  HENT:     Head: Normocephalic and atraumatic.     Mouth/Throat:     Pharynx: No oropharyngeal exudate.  Eyes:     Conjunctiva/sclera: Conjunctivae normal.     Pupils: Pupils are equal, round, and reactive to light.  Cardiovascular:     Rate and Rhythm: Normal rate and regular rhythm.     Heart sounds: Normal heart sounds.  Pulmonary:     Effort: Pulmonary effort is normal.     Breath sounds: Normal breath sounds.  Abdominal:     General: Bowel sounds are normal.     Palpations: Abdomen is soft.  Musculoskeletal:        General: Tenderness present.     Cervical back: Normal range of motion and neck supple.     Right lower leg: No edema.     Left lower leg: No edema.  Skin:    General: Skin is warm and dry.     Findings: Bruising (noted to right neck, and hand) present.  Neurological:     Mental Status: She is alert and oriented to  person, place, and time.     Motor:  Weakness present.     Gait: Gait abnormal.  Psychiatric:        Mood and Affect: Mood normal.     Labs reviewed: Recent Labs    12/19/22 2251 12/20/22 0450 12/21/22 0518 03/13/23 1029 03/13/23 1034  NA  --  139 139 139 140  K  --  3.2* 3.8 3.6 3.5  CL  --  109 110 102 104  CO2  --  23 23 26   --   GLUCOSE  --  111* 114* 108* 103*  BUN  --  29* 26* 15 16  CREATININE  --  1.28* 1.15* 1.07* 1.10*  CALCIUM  --  7.8* 7.8* 9.1  --   MG 1.4*  --   --   --   --    Recent Labs    12/20/22 0450 03/13/23 1029  AST 17 29  ALT 15 26  ALKPHOS 49 51  BILITOT 0.8 0.7  PROT 6.1* 6.9  ALBUMIN 3.3* 3.6   Recent Labs    12/19/22 1325 12/19/22 2251 12/20/22 0450 12/21/22 0518 03/02/23 0000 03/13/23 1029 03/13/23 1034  WBC 9.6  --  11.0* 9.5 8.0 7.2  --   NEUTROABS 7.0  --   --   --  4,992.00 4.6  --   HGB 11.4*   < > 9.7* 8.6* 13.0 13.4 13.9  HCT 34.3*   < > 30.9* 26.4* 40 41.2 41.0  MCV 91.7  --  96.3 93.0  --  94.7  --   PLT 241  --  197 210 249 256  --    < > = values in this interval not displayed.   No results found for: "TSH" Lab Results  Component Value Date   HGBA1C 6.4 (H) 03/13/2023   Lab Results  Component Value Date   CHOL 112 03/14/2023   HDL 48 03/14/2023   LDLCALC 41 03/14/2023   TRIG 117 03/14/2023   CHOLHDL 2.3 03/14/2023    Significant Diagnostic Results in last 30 days:  CT Head Wo Contrast  Result Date: 06/16/2023 CLINICAL DATA:  Head trauma, minor (Age >= 65y); Neck trauma, mechanically unstable spine (Age >= 16y) EXAM: CT HEAD WITHOUT CONTRAST CT CERVICAL SPINE WITHOUT CONTRAST TECHNIQUE: Multidetector CT imaging of the head and cervical spine was performed following the standard protocol without intravenous contrast. Multiplanar CT image reconstructions of the cervical spine were also generated. RADIATION DOSE REDUCTION: This exam was performed according to the departmental dose-optimization program which  includes automated exposure control, adjustment of the mA and/or kV according to patient size and/or use of iterative reconstruction technique. COMPARISON:  CTA head/neck 03/13/23 FINDINGS: CT HEAD FINDINGS Brain: No hemorrhage. No hydrocephalus. No extra-axial fluid collection. There is an evolving left MCA territory infarct. No mass effect. No mass lesion. No CT evidence of an acute cortical infarct. Vascular: No hyperdense vessel or unexpected calcification. Skull: Soft tissue swelling along the right frontal scalp. No evidence of underlying calvarial fracture. Sinuses/Orbits: No middle ear or mastoid effusion. Paranasal sinuses are clear. Bilateral lens replacement. Orbits are otherwise unremarkable. Other: None. CT CERVICAL SPINE FINDINGS Alignment: Trace anterolisthesis of C3 on C4 and C4 on C5. Skull base and vertebrae: No acute fracture. There is diffusely heterogeneous appearance of the cervical vertebral bodies. This may be secondary to osteopenia, but further evaluation with a contrast-enhanced cervical and thoracic spine MRI is recommended to exclude the possibility of underlying lesions. Soft tissues and spinal canal: No prevertebral fluid or swelling. No visible canal  hematoma. Disc levels:  No evidence of high-grade spinal canal stenosis Upper chest: Negative. Other: None IMPRESSION: 1. No acute intracranial abnormality. 2. Soft tissue swelling along the right frontal scalp. No evidence of underlying calvarial fracture. 3. No acute fracture or traumatic subluxation of the cervical spine. 4. Diffusely heterogeneous appearance of the cervical and visualized thoracic vertebral bodies. This may be secondary to osteopenia, but further evaluation with a contrast-enhanced cervical and thoracic spine MRI is recommended to exclude the possibility of underlying lesions. Electronically Signed   By: Lorenza Cambridge M.D.   On: 06/16/2023 10:39   CT Cervical Spine Wo Contrast  Result Date: 06/16/2023 CLINICAL DATA:   Head trauma, minor (Age >= 65y); Neck trauma, mechanically unstable spine (Age >= 16y) EXAM: CT HEAD WITHOUT CONTRAST CT CERVICAL SPINE WITHOUT CONTRAST TECHNIQUE: Multidetector CT imaging of the head and cervical spine was performed following the standard protocol without intravenous contrast. Multiplanar CT image reconstructions of the cervical spine were also generated. RADIATION DOSE REDUCTION: This exam was performed according to the departmental dose-optimization program which includes automated exposure control, adjustment of the mA and/or kV according to patient size and/or use of iterative reconstruction technique. COMPARISON:  CTA head/neck 03/13/23 FINDINGS: CT HEAD FINDINGS Brain: No hemorrhage. No hydrocephalus. No extra-axial fluid collection. There is an evolving left MCA territory infarct. No mass effect. No mass lesion. No CT evidence of an acute cortical infarct. Vascular: No hyperdense vessel or unexpected calcification. Skull: Soft tissue swelling along the right frontal scalp. No evidence of underlying calvarial fracture. Sinuses/Orbits: No middle ear or mastoid effusion. Paranasal sinuses are clear. Bilateral lens replacement. Orbits are otherwise unremarkable. Other: None. CT CERVICAL SPINE FINDINGS Alignment: Trace anterolisthesis of C3 on C4 and C4 on C5. Skull base and vertebrae: No acute fracture. There is diffusely heterogeneous appearance of the cervical vertebral bodies. This may be secondary to osteopenia, but further evaluation with a contrast-enhanced cervical and thoracic spine MRI is recommended to exclude the possibility of underlying lesions. Soft tissues and spinal canal: No prevertebral fluid or swelling. No visible canal hematoma. Disc levels:  No evidence of high-grade spinal canal stenosis Upper chest: Negative. Other: None IMPRESSION: 1. No acute intracranial abnormality. 2. Soft tissue swelling along the right frontal scalp. No evidence of underlying calvarial fracture. 3.  No acute fracture or traumatic subluxation of the cervical spine. 4. Diffusely heterogeneous appearance of the cervical and visualized thoracic vertebral bodies. This may be secondary to osteopenia, but further evaluation with a contrast-enhanced cervical and thoracic spine MRI is recommended to exclude the possibility of underlying lesions. Electronically Signed   By: Lorenza Cambridge M.D.   On: 06/16/2023 10:39    Assessment/Plan 1. Laceration of scalp without foreign body, subsequent encounter -noted after fall on 06/16/2023 staples done in ED, healing well at this time, staples to be removed after 7 days  2. Contusion of right hand, subsequent encounter -noted bruising but without significant pain or swelling.  3. Contusion of neck, subsequent encounter -noted after fall, having some swelling with bruising but without significant pain or discomfort.   4. Right hemiparesis (HCC) After CVA, has completed PT continues to be high fall risk. Fall precautions in places.  Janene Harvey. Biag[facility] [phone]

## 2023-07-16 NOTE — Progress Notes (Signed)
Location:    Nursing Home Room Number: [address] Place of Service:  [id number]) Provider:  Ander Gaster, Benetta Spar, MD  Patient Care Team: Earnestine Mealing, MD as PCP - General [facility])  Extended Emergency Contact Information Primary Emergency Contact: [facility] Address: [place], [name] [address] [address] Home Phone: [phone] Relation: Daughter Secondary Emergency Contact: [facility] Mobile Phone: [phone] Relation: Sister  Code Status:  Full Code  Goals of care: Advanced Directive information    06/20/2023    1:01 PM  Advanced Directives  Does Patient Have a Medical Advance Directive? No  Would patient like information on creating a medical advance directive? No - Patient declined     Chief Complaint  Patient presents with   Medical Management of Chronic Conditions    HPI:  Pt is a 82 y.o. female seen today for medical management of chronic diseases.    She had a fall on Thanksgiving while in the bathroom. No LOC or hitting head. She did hurt her shoulder. Continues to hurt. Some bruising. Her right shoulder was injured 2 years ago.   Denies pain in feet or legs.   The right foot it is laborious and hard for her when she is tired. Sometimes she has trouble pick it up to move. 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 [facility] and then [facility] for rehab and a long time before going to neurologist. Before the stroke there were balance issues as well. 6-8 months before the stroke. Never lost consciousness at that time. She would just fall without notice or other symptoms. After the stroke she was at [facility] rehab and she would fall as well.   She is wondering about any other issues because she  She has a weak hip. She will be able to resume physical therapy in January to continue progression.    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 b.) Doppler 12/30/2014: >70% LICA and 70% RICA. c.) Doppler 10/25/16: >70% Bilater ICAs   Chronic bilateral low back pain with left-sided sciatica    CKD (chronic kidney disease), stage III (HCC)    Coronary artery disease    a.) LHC 12/24/2014 -> small LAD system; diffuse CTO of LAD. 60% RCA. LM and LCx with no sig disease. no intervention; med mgmt.   Degenerative arthritis    Diastolic dysfunction    a.) TTE 09/20/2018: EF 55%, G1DD, mild LAE and RVE, triv-mild panvalvular regurgitation.   Dysarthria    GERD (gastroesophageal reflux disease)    Hepatic steatosis    History of kidney stones    HLD (hyperlipidemia)    Hypertension    Kidney stone    Lumbar radiculopathy    Major depression in remission (HCC)    PVD (peripheral vascular disease) (HCC)    Renal cyst, right 06/02/2015   a.) CT 06/02/2015 --> 4.4 cm RIGHT renal mass; MRI recommended. b.) MRI 06/16/2015 --> 3.8 x 4.2 x 4.0 cm proteinaceous/hemmorhagic cyst.   Subclavian steal syndrome    a.) LHC 12/24/2014 --> tight eccentric 90% ostial stenosis with damping.   T2DM (type 2 diabetes mellitus) (HCC)    Past Surgical History:  Procedure Laterality Date   ABDOMINAL HYSTERECTOMY     AUGMENTATION MAMMAPLASTY Bilateral 1980  BACK SURGERY  09/2018   BICEPT TENODESIS  06/10/2021   Procedure: BICEPS TENODESIS;  Surgeon: Christena Flake, MD;  Location: [facility] ORS;  Service: Orthopedics;;   CARDIAC CATHETERIZATION Left 12/24/2014   Procedure: LEFT HEARD CATHETERIZATION; Location: [facility]; Surgeon: Ander Purpura, MD   CHOLECYSTECTOMY     COLONOSCOPY N/A 02/11/2016   Procedure: COLONOSCOPY;  Surgeon: Scot Jun, MD;  Location: [facility] ENDOSCOPY;  Service: Endoscopy;  Laterality: N/A;   IR CT HEAD LTD  11/16/2021   IR CT HEAD LTD  11/16/2021   IR INTRAVSC STENT CERV CAROTID W/O EMB-PROT MOD SED INC ANGIO  11/18/2021   IR PERCUTANEOUS ART THROMBECTOMY/INFUSION  INTRACRANIAL INC DIAG ANGIO  11/16/2021   RADIOLOGY WITH ANESTHESIA N/A 11/16/2021   Procedure: IR WITH ANESTHESIA- CODE STROKE;  Surgeon: Radiologist, Medication, MD;  Location: [facility] OR;  Service: Radiology;  Laterality: N/A;   REVERSE SHOULDER ARTHROPLASTY Right 06/10/2021   Procedure: REVERSE SHOULDER ARTHROPLASTY;  Surgeon: Christena Flake, MD;  Location: [facility] ORS;  Service: Orthopedics;  Laterality: Right;    Allergies  Allergen Reactions   Sulfa Antibiotics Other (See Comments)    Unknown reaction    Outpatient Encounter Medications as of 07/17/2023  Medication Sig   acetaminophen (TYLENOL) 325 MG tablet Take 650 mg by mouth every 4 (four) hours as needed (discomfort, elevated temperature).   alum & mag hydroxide-simeth (MAALOX/MYLANTA) 200-200-20 MG/5 SUSP Apply 30 mLs topically every 4 (four) hours as needed (gas, indigestion, upset stomach).   aspirin 81 MG chewable tablet Chew 1 tablet (81 mg total) by mouth daily.   atorvastatin (LIPITOR) 80 MG tablet Take 80 mg by mouth daily.   bismuth subsalicylate (KAOPECTATE) 262 MG/15ML suspension Take 10 mLs by mouth as needed for diarrhea or loose stools.   Carbamide Peroxide (DEBROX OT) Place 5 drops into both ears as needed (ear wax).   carvedilol (COREG) 3.125 MG tablet Take 3.125 mg by mouth 2 (two) times daily.   cetirizine (ZYRTEC) 5 MG tablet Take 5 mg by mouth at bedtime as needed for allergies.   chlorthalidone (HYGROTON) 25 MG tablet Take 25 mg by mouth daily.   dextromethorphan-guaiFENesin (TUSSIN DM) 10-100 MG/5ML liquid Take 10 mLs by mouth every 4 (four) hours as needed for cough.   docusate sodium (COLACE) 100 MG capsule Take 100 mg by mouth 2 (two) times daily.   escitalopram (LEXAPRO) 10 MG tablet Take 10 mg by mouth daily.   ezetimibe (ZETIA) 10 MG tablet Take 10 mg by mouth daily.   Fe Fum-Vit C-Vit B12-FA (TRIGELS-F FORTE) CAPS capsule Take 1 capsule by mouth 2 (two) times daily.   Glucose 15 GM/32ML GEL Take 1 packet by  mouth as needed (low blood sugar).   isosorbide mononitrate (IMDUR) 30 MG 24 hr tablet Take 30 mg by mouth daily.   losartan (COZAAR) 100 MG tablet Take 100 mg by mouth daily.    magnesium chloride (SLOW-MAG) 64 MG TBEC SR tablet Take 1 tablet by mouth daily.   magnesium hydroxide (MILK OF MAGNESIA) 400 MG/5ML suspension Take 30 mLs by mouth as needed (constipation).   Multiple Vitamin (THEREMS PO) Take 1 tablet by mouth daily.   nitroGLYCERIN (NITROSTAT) 0.4 MG SL tablet Place 0.4 mg under the tongue every 5 (five) minutes as needed for chest pain.   nystatin (MYCOSTATIN/NYSTOP) powder Apply 1 Application topically 2 (two) times daily as needed (skin breakdown).   ondansetron (ZOFRAN) 4 MG tablet Take 4 mg by mouth 3 (three) times daily as  needed for nausea.   OXYGEN Inhale into the lungs. 2lpm for dyspnea or SOB   pantoprazole (PROTONIX) 40 MG tablet Take 40 mg by mouth daily.   ticagrelor (BRILINTA) 90 MG TABS tablet Take 1 tablet (90 mg total) by mouth 2 (two) times daily.   Vitamin D, Ergocalciferol, (DRISDOL) 1.25 MG (50000 UNIT) CAPS capsule Take 1 capsule (50,000 Units total) by mouth every 7 (seven) days.   No facility-administered encounter medications on file as of 07/17/2023.    Review of Systems  Immunization History  Administered Date(s) Administered   Influenza Inj Mdck Quad Pf 05/02/2018   Influenza Split 05/26/2015   Influenza, High Dose Seasonal PF 05/16/2019, 05/10/2020, 05/25/2021   Influenza-Unspecified 05/01/2012, 05/12/2013, 06/08/2016, 04/28/2017, 05/26/2023   Moderna Covid-19 Vaccine Bivalent Booster 10yrs & up 05/25/2021   PFIZER Comirnaty(Gray Top)Covid-19 Tri-Sucrose Vaccine 08/15/2019, 09/07/2019, 05/10/2020   PFIZER(Purple Top)SARS-COV-2 Vaccination 08/15/2019, 09/07/2019, 05/02/2020, 11/25/2020, 05/05/2023   Pneumococcal Conjugate-13 05/26/2015   Pneumococcal Polysaccharide-23 08/09/2011   Zoster Recombinant(Shingrix) 08/30/2018, 01/03/2019   Zoster, Live  08/09/2011   Pertinent  Health Maintenance Due  Topic Date Due   FOOT EXAM  Never done   OPHTHALMOLOGY EXAM  Never done   DEXA SCAN  Never done   HEMOGLOBIN A1C  09/13/2023   INFLUENZA VACCINE  Completed      11/23/2021   11:00 AM 11/23/2021    8:00 PM 11/24/2021    9:00 AM 11/24/2021    8:10 PM 11/25/2021    8:00 AM  Fall Risk  (RETIRED) Patient Fall Risk Level Moderate fall risk Moderate fall risk Moderate fall risk Moderate fall risk Moderate fall risk   Functional Status Survey:    Vitals:   07/17/23 2111  BP: (!) 156/81  Resp: 18  Temp: (!) 97.5 F (36.4 C)  SpO2: 93%  Weight: 153 lb (69.4 kg)   Body mass index is 27.1 kg/m. Physical Exam Cardiovascular:     Rate and Rhythm: Normal rate and regular rhythm.     Pulses: Normal pulses.  Pulmonary:     Effort: Pulmonary effort is normal.  Abdominal:     General: Abdomen is flat.     Palpations: Abdomen is soft.  Musculoskeletal:     Comments: RLE with brace  Skin:    General: Skin is warm.  Neurological:     Mental Status: She is alert and oriented to person, place, and time.     Comments: Dysarthric speech     Labs reviewed: Recent Labs    12/19/22 2251 12/20/22 0450 12/21/22 0518 03/13/23 1029 03/13/23 1034  NA  --  139 139 139 140  K  --  3.2* 3.8 3.6 3.5  CL  --  109 110 102 104  CO2  --  23 23 26   --   GLUCOSE  --  111* 114* 108* 103*  BUN  --  29* 26* 15 16  CREATININE  --  1.28* 1.15* 1.07* 1.10*  CALCIUM  --  7.8* 7.8* 9.1  --   MG 1.4*  --   --   --   --    Recent Labs    12/20/22 0450 03/13/23 1029  AST 17 29  ALT 15 26  ALKPHOS 49 51  BILITOT 0.8 0.7  PROT 6.1* 6.9  ALBUMIN 3.3* 3.6   Recent Labs    12/19/22 1325 12/19/22 2251 12/20/22 0450 12/21/22 0518 03/02/23 0000 03/13/23 1029 03/13/23 1034  WBC 9.6  --  11.0* 9.5 8.0 7.2  --  NEUTROABS 7.0  --   --   --  4,992.00 4.6  --   HGB 11.4*   < > 9.7* 8.6* 13.0 13.4 13.9  HCT 34.3*   < > 30.9* 26.4* 40 41.2 41.0  MCV  91.7  --  96.3 93.0  --  94.7  --   PLT 241  --  197 210 249 256  --    < > = values in this interval not displayed.   No results found for: "TSH" Lab Results  Component Value Date   HGBA1C 6.4 (H) 03/13/2023   Lab Results  Component Value Date   CHOL 112 03/14/2023   HDL 48 03/14/2023   LDLCALC 41 03/14/2023   TRIG 117 03/14/2023   CHOLHDL 2.3 03/14/2023    Significant Diagnostic Results in last 30 days:  No results found.  Assessment/Plan History of stroke - Plan: Nerve conduction test, DME Wheelchair manual  Right leg weakness - Plan: Nerve conduction test  Fall, subsequent encounter  Controlled type 2 diabetes mellitus with stage 3 chronic kidney disease, without long-term current use of insulin (HCC)  Right hemiparesis (HCC)  Carotid artery disease, unspecified laterality, unspecified type (HCC)  Major depressive disorder, single episode, moderate (HCC)  Benign hypertension with CKD (chronic kidney disease) stage III (HCC) Patient with history of stroke with Right-sided hemiparesis. Discussed extensively the pathophysiology of nerve conduction and muscle contraction. Also discussed the impact a stroke can have on patient's strength. Family would like further evaluation for a secondary cause of her weakness and continued falls. Ordered nerve-conduction study to aid in determination. Labs for DM. Continue statin ans ASA. Mood stable at this time.   Family/ staff Communication: nursing, daughter  Labs/tests ordered:  A1c, cmp, magnesium

## 2023-07-17 ENCOUNTER — Non-Acute Institutional Stay: Payer: Medicare Other | Admitting: Student

## 2023-07-17 DIAGNOSIS — Z8673 Personal history of transient ischemic attack (TIA), and cerebral infarction without residual deficits: Secondary | ICD-10-CM | POA: Diagnosis not present

## 2023-07-17 DIAGNOSIS — E1122 Type 2 diabetes mellitus with diabetic chronic kidney disease: Secondary | ICD-10-CM | POA: Diagnosis not present

## 2023-07-17 DIAGNOSIS — W19XXXD Unspecified fall, subsequent encounter: Secondary | ICD-10-CM

## 2023-07-17 DIAGNOSIS — I129 Hypertensive chronic kidney disease with stage 1 through stage 4 chronic kidney disease, or unspecified chronic kidney disease: Secondary | ICD-10-CM

## 2023-07-17 DIAGNOSIS — G8191 Hemiplegia, unspecified affecting right dominant side: Secondary | ICD-10-CM

## 2023-07-17 DIAGNOSIS — F321 Major depressive disorder, single episode, moderate: Secondary | ICD-10-CM

## 2023-07-17 DIAGNOSIS — R29898 Other symptoms and signs involving the musculoskeletal system: Secondary | ICD-10-CM

## 2023-07-17 DIAGNOSIS — N183 Chronic kidney disease, stage 3 unspecified: Secondary | ICD-10-CM

## 2023-07-17 DIAGNOSIS — I779 Disorder of arteries and arterioles, unspecified: Secondary | ICD-10-CM

## 2023-07-23 ENCOUNTER — Encounter: Payer: Self-pay | Admitting: Student

## 2023-07-23 NOTE — Patient Instructions (Signed)
I've ordered a nerve conduction study to further evaluate your right sided weakness.   I have also ordered a wheelchair for when you are out with family. This can take ~4 weeks to be delivered.

## 2023-08-10 DIAGNOSIS — Z9181 History of falling: Secondary | ICD-10-CM | POA: Diagnosis not present

## 2023-08-10 DIAGNOSIS — G8191 Hemiplegia, unspecified affecting right dominant side: Secondary | ICD-10-CM | POA: Diagnosis not present

## 2023-08-10 DIAGNOSIS — I2510 Atherosclerotic heart disease of native coronary artery without angina pectoris: Secondary | ICD-10-CM | POA: Diagnosis not present

## 2023-08-10 DIAGNOSIS — M6281 Muscle weakness (generalized): Secondary | ICD-10-CM | POA: Diagnosis not present

## 2023-08-10 DIAGNOSIS — I129 Hypertensive chronic kidney disease with stage 1 through stage 4 chronic kidney disease, or unspecified chronic kidney disease: Secondary | ICD-10-CM | POA: Diagnosis not present

## 2023-08-10 DIAGNOSIS — R278 Other lack of coordination: Secondary | ICD-10-CM | POA: Diagnosis not present

## 2023-08-10 DIAGNOSIS — I5032 Chronic diastolic (congestive) heart failure: Secondary | ICD-10-CM | POA: Diagnosis not present

## 2023-08-10 DIAGNOSIS — R2689 Other abnormalities of gait and mobility: Secondary | ICD-10-CM | POA: Diagnosis not present

## 2023-08-10 DIAGNOSIS — Z741 Need for assistance with personal care: Secondary | ICD-10-CM | POA: Diagnosis not present

## 2023-08-10 DIAGNOSIS — I739 Peripheral vascular disease, unspecified: Secondary | ICD-10-CM | POA: Diagnosis not present

## 2023-08-10 DIAGNOSIS — N1831 Chronic kidney disease, stage 3a: Secondary | ICD-10-CM | POA: Diagnosis not present

## 2023-08-11 DIAGNOSIS — B351 Tinea unguium: Secondary | ICD-10-CM | POA: Diagnosis not present

## 2023-08-11 DIAGNOSIS — L03031 Cellulitis of right toe: Secondary | ICD-10-CM | POA: Diagnosis not present

## 2023-08-11 DIAGNOSIS — M21371 Foot drop, right foot: Secondary | ICD-10-CM | POA: Diagnosis not present

## 2023-08-11 DIAGNOSIS — L84 Corns and callosities: Secondary | ICD-10-CM | POA: Diagnosis not present

## 2023-08-11 DIAGNOSIS — I739 Peripheral vascular disease, unspecified: Secondary | ICD-10-CM | POA: Diagnosis not present

## 2023-08-11 DIAGNOSIS — L600 Ingrowing nail: Secondary | ICD-10-CM | POA: Diagnosis not present

## 2023-08-16 DIAGNOSIS — M21371 Foot drop, right foot: Secondary | ICD-10-CM | POA: Diagnosis not present

## 2023-08-16 DIAGNOSIS — F0390 Unspecified dementia without behavioral disturbance: Secondary | ICD-10-CM | POA: Diagnosis not present

## 2023-08-16 DIAGNOSIS — G8191 Hemiplegia, unspecified affecting right dominant side: Secondary | ICD-10-CM | POA: Diagnosis not present

## 2023-08-22 DIAGNOSIS — R600 Localized edema: Secondary | ICD-10-CM | POA: Diagnosis not present

## 2023-08-22 DIAGNOSIS — E7800 Pure hypercholesterolemia, unspecified: Secondary | ICD-10-CM | POA: Diagnosis not present

## 2023-08-22 DIAGNOSIS — I10 Essential (primary) hypertension: Secondary | ICD-10-CM | POA: Diagnosis not present

## 2023-08-22 DIAGNOSIS — I25118 Atherosclerotic heart disease of native coronary artery with other forms of angina pectoris: Secondary | ICD-10-CM | POA: Diagnosis not present

## 2023-09-11 DIAGNOSIS — R278 Other lack of coordination: Secondary | ICD-10-CM | POA: Diagnosis not present

## 2023-09-11 DIAGNOSIS — G8191 Hemiplegia, unspecified affecting right dominant side: Secondary | ICD-10-CM | POA: Diagnosis not present

## 2023-09-11 DIAGNOSIS — R2689 Other abnormalities of gait and mobility: Secondary | ICD-10-CM | POA: Diagnosis not present

## 2023-09-11 DIAGNOSIS — I739 Peripheral vascular disease, unspecified: Secondary | ICD-10-CM | POA: Diagnosis not present

## 2023-09-11 DIAGNOSIS — I2510 Atherosclerotic heart disease of native coronary artery without angina pectoris: Secondary | ICD-10-CM | POA: Diagnosis not present

## 2023-09-11 DIAGNOSIS — Z9181 History of falling: Secondary | ICD-10-CM | POA: Diagnosis not present

## 2023-09-11 DIAGNOSIS — N1831 Chronic kidney disease, stage 3a: Secondary | ICD-10-CM | POA: Diagnosis not present

## 2023-09-11 DIAGNOSIS — I5032 Chronic diastolic (congestive) heart failure: Secondary | ICD-10-CM | POA: Diagnosis not present

## 2023-09-11 DIAGNOSIS — Z741 Need for assistance with personal care: Secondary | ICD-10-CM | POA: Diagnosis not present

## 2023-09-11 DIAGNOSIS — I129 Hypertensive chronic kidney disease with stage 1 through stage 4 chronic kidney disease, or unspecified chronic kidney disease: Secondary | ICD-10-CM | POA: Diagnosis not present

## 2023-09-11 DIAGNOSIS — M6281 Muscle weakness (generalized): Secondary | ICD-10-CM | POA: Diagnosis not present

## 2023-09-19 ENCOUNTER — Other Ambulatory Visit: Payer: Self-pay

## 2023-09-19 ENCOUNTER — Emergency Department
Admission: EM | Admit: 2023-09-19 | Discharge: 2023-09-19 | Disposition: A | Discharge status: Home / Self Care | Payer: Medicare Other | Attending: Emergency Medicine | Admitting: Emergency Medicine

## 2023-09-19 ENCOUNTER — Non-Acute Institutional Stay: Payer: Self-pay | Admitting: Nurse Practitioner

## 2023-09-19 ENCOUNTER — Emergency Department: Payer: Medicare Other

## 2023-09-19 ENCOUNTER — Encounter: Payer: Self-pay | Admitting: Nurse Practitioner

## 2023-09-19 DIAGNOSIS — E1122 Type 2 diabetes mellitus with diabetic chronic kidney disease: Secondary | ICD-10-CM

## 2023-09-19 DIAGNOSIS — Z7901 Long term (current) use of anticoagulants: Secondary | ICD-10-CM | POA: Diagnosis not present

## 2023-09-19 DIAGNOSIS — I129 Hypertensive chronic kidney disease with stage 1 through stage 4 chronic kidney disease, or unspecified chronic kidney disease: Secondary | ICD-10-CM | POA: Insufficient documentation

## 2023-09-19 DIAGNOSIS — Z96611 Presence of right artificial shoulder joint: Secondary | ICD-10-CM | POA: Insufficient documentation

## 2023-09-19 DIAGNOSIS — S0003XA Contusion of scalp, initial encounter: Secondary | ICD-10-CM | POA: Diagnosis not present

## 2023-09-19 DIAGNOSIS — W19XXXD Unspecified fall, subsequent encounter: Secondary | ICD-10-CM | POA: Diagnosis not present

## 2023-09-19 DIAGNOSIS — R531 Weakness: Secondary | ICD-10-CM | POA: Diagnosis not present

## 2023-09-19 DIAGNOSIS — W19XXXA Unspecified fall, initial encounter: Secondary | ICD-10-CM | POA: Insufficient documentation

## 2023-09-19 DIAGNOSIS — M8580 Other specified disorders of bone density and structure, unspecified site: Secondary | ICD-10-CM | POA: Diagnosis not present

## 2023-09-19 DIAGNOSIS — S0101XA Laceration without foreign body of scalp, initial encounter: Secondary | ICD-10-CM | POA: Diagnosis not present

## 2023-09-19 DIAGNOSIS — Z23 Encounter for immunization: Secondary | ICD-10-CM | POA: Diagnosis not present

## 2023-09-19 DIAGNOSIS — Z79899 Other long term (current) drug therapy: Secondary | ICD-10-CM | POA: Diagnosis not present

## 2023-09-19 DIAGNOSIS — Z7982 Long term (current) use of aspirin: Secondary | ICD-10-CM | POA: Insufficient documentation

## 2023-09-19 DIAGNOSIS — R300 Dysuria: Secondary | ICD-10-CM

## 2023-09-19 DIAGNOSIS — Z859 Personal history of malignant neoplasm, unspecified: Secondary | ICD-10-CM | POA: Insufficient documentation

## 2023-09-19 DIAGNOSIS — I10 Essential (primary) hypertension: Secondary | ICD-10-CM | POA: Diagnosis not present

## 2023-09-19 DIAGNOSIS — I499 Cardiac arrhythmia, unspecified: Secondary | ICD-10-CM | POA: Diagnosis not present

## 2023-09-19 DIAGNOSIS — R0902 Hypoxemia: Secondary | ICD-10-CM | POA: Diagnosis not present

## 2023-09-19 DIAGNOSIS — N1832 Chronic kidney disease, stage 3b: Secondary | ICD-10-CM

## 2023-09-19 DIAGNOSIS — S0101XD Laceration without foreign body of scalp, subsequent encounter: Secondary | ICD-10-CM | POA: Diagnosis not present

## 2023-09-19 DIAGNOSIS — N183 Chronic kidney disease, stage 3 unspecified: Secondary | ICD-10-CM

## 2023-09-19 DIAGNOSIS — I2510 Atherosclerotic heart disease of native coronary artery without angina pectoris: Secondary | ICD-10-CM | POA: Diagnosis not present

## 2023-09-19 DIAGNOSIS — G8190 Hemiplegia, unspecified affecting unspecified side: Secondary | ICD-10-CM | POA: Diagnosis not present

## 2023-09-19 DIAGNOSIS — D509 Iron deficiency anemia, unspecified: Secondary | ICD-10-CM

## 2023-09-19 DIAGNOSIS — S0990XA Unspecified injury of head, initial encounter: Secondary | ICD-10-CM | POA: Diagnosis not present

## 2023-09-19 MED ORDER — TETANUS-DIPHTH-ACELL PERTUSSIS 5-2.5-18.5 LF-MCG/0.5 IM SUSY
0.5000 mL | PREFILLED_SYRINGE | Freq: Once | INTRAMUSCULAR | Status: AC
  Administered 2023-09-19: 0.5 mL via INTRAMUSCULAR
  Filled 2023-09-19: qty 0.5

## 2023-09-19 NOTE — ED Notes (Signed)
Renie Ora, pts sister, offered to take pt to facility and took her there.

## 2023-09-19 NOTE — ED Notes (Signed)
called to [facility] for pt transport to facility/[facility].

## 2023-09-19 NOTE — ED Provider Notes (Signed)
[facility] Provider Note    Event Date/Time   First MD Initiated Contact with Patient 09/19/23 0131     (approximate)   History   Fall   HPI  Cassidy Hernandez is a 83 y.o. female brought to the ED via EMS from [facility] status post mechanical fall with scalp laceration.  Patient unsure of her tetanus status.  Denies vision changes, neck pain, chest pain, shortness of breath, abdominal pain, nausea, vomiting or dizziness.  Endorses anticoagulant use.     Past Medical History   Past Medical History:  Diagnosis Date   Anginal pain (HCC)    Aortic atherosclerosis    Cancer (HCC)    Carotid artery stenosis 02/01/2010   a.) Doppler 02/01/2010: 72% LICA and 60% RICA. b.) Doppler 12/30/2014: >70% LICA and 70% RICA. c.) Doppler 10/25/16: >70% Bilater ICAs   Chronic bilateral low back pain with left-sided sciatica    CKD (chronic kidney disease), stage III (HCC)    Coronary artery disease    a.) LHC 12/24/2014 -> small LAD system; diffuse CTO of LAD. 60% RCA. LM and LCx with no sig disease. no intervention; med mgmt.   Degenerative arthritis    Diastolic dysfunction    a.) TTE 09/20/2018: EF 55%, G1DD, mild LAE and RVE, triv-mild panvalvular regurgitation.   Dysarthria    GERD (gastroesophageal reflux disease)    Hepatic steatosis    History of kidney stones    HLD (hyperlipidemia)    Hypertension    Kidney stone    Lumbar radiculopathy    Major depression in remission (HCC)    PVD (peripheral vascular disease) (HCC)    Renal cyst, right 06/02/2015   a.) CT 06/02/2015 --> 4.4 cm RIGHT renal mass; MRI recommended. b.) MRI 06/16/2015 --> 3.8 x 4.2 x 4.0 cm proteinaceous/hemmorhagic cyst.   Subclavian steal syndrome    a.) LHC 12/24/2014 --> tight eccentric 90% ostial stenosis with damping.   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[facility]) 02/27/2023   Hematoma 12/21/2022   Urinary frequency 12/20/2022   Fall 12/19/2022   Hip hematoma, left 12/19/2022   Hypokalemia 12/19/2022   Benign hypertension with CKD (chronic kidney disease) stage III (HCC) 03/02/2022   CAD (coronary artery disease) 03/02/2022   Hypercholesteremia 03/02/2022   Right hemiparesis (HCC) 12/16/2021   History of stroke 11/16/2021   Middle cerebral artery embolism, 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   Anxiety disorder, unspecified 06/14/2021   Constipation, unspecified 06/14/2021   Carpal tunnel syndrome, bilateral upper limbs 06/14/2021   Insomnia, unspecified 06/14/2021   Long term (current) use of aspirin 06/14/2021   Major depressive disorder, single episode, moderate (HCC) 06/14/2021   Peripheral vascular disease (HCC) 06/14/2021   Status post reverse arthroplasty of shoulder, 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/2021   Traumatic complete tear of right rotator cuff 02/26/2021   Major depression in remission (HCC) 06/25/2020   Dysarthria 09/20/2018   Gastroesophageal reflux disease without esophagitis 09/20/2018   Lumbar radiculopathy 09/20/2018   Healthcare maintenance 05/02/2018   Controlled type 2 diabetes mellitus with stage 3 chronic kidney disease, without long-term current use of insulin (HCC) 01/30/2018   Chronic bilateral low back pain with left-sided sciatica 12/07/2016   Hepatic steatosis 11/23/2015   Carotid artery disease (HCC) 01/06/2010     Past Surgical History  Past Surgical History:  Procedure Laterality Date   ABDOMINAL HYSTERECTOMY     AUGMENTATION MAMMAPLASTY Bilateral 1980   BACK SURGERY  09/2018   BICEPT TENODESIS  06/10/2021   Procedure: BICEPS TENODESIS;  Surgeon: Christena Flake, MD;  Location: [facility] ORS;  Service: Orthopedics;;   CARDIAC CATHETERIZATION Left 12/24/2014   Procedure: LEFT HEARD CATHETERIZATION; Location: [facility]; Surgeon: Ander Purpura, MD   CHOLECYSTECTOMY     COLONOSCOPY N/A 02/11/2016    Procedure: COLONOSCOPY;  Surgeon: Scot Jun, MD;  Location: [facility] ENDOSCOPY;  Service: Endoscopy;  Laterality: N/A;   IR CT HEAD LTD  11/16/2021   IR CT HEAD LTD  11/16/2021   IR INTRAVSC STENT CERV CAROTID W/O EMB-PROT MOD SED INC ANGIO  11/18/2021   IR PERCUTANEOUS ART THROMBECTOMY/INFUSION INTRACRANIAL INC DIAG ANGIO  11/16/2021   RADIOLOGY WITH ANESTHESIA N/A 11/16/2021   Procedure: IR WITH ANESTHESIA- CODE STROKE;  Surgeon: Radiologist, Medication, MD;  Location: [facility] OR;  Service: Radiology;  Laterality: N/A;   REVERSE SHOULDER ARTHROPLASTY Right 06/10/2021   Procedure: REVERSE SHOULDER ARTHROPLASTY;  Surgeon: Christena Flake, MD;  Location: [facility] ORS;  Service: Orthopedics;  Laterality: Right;     Home Medications   Prior to Admission medications   Medication Sig Start Date End Date Taking? Authorizing Provider  acetaminophen (TYLENOL) 325 MG tablet Take 650 mg by mouth every 4 (four) hours as needed (discomfort, elevated temperature).    [provider]  alum & mag hydroxide-simeth (MAALOX/MYLANTA) 200-200-20 MG/5 SUSP Apply 30 mLs topically every 4 (four) hours as needed (gas, indigestion, upset stomach).    [provider]  aspirin 81 MG chewable tablet Chew 1 tablet (81 mg total) by mouth daily. 11/26/21   de Saintclair Halsted, Cortney E, NP  atorvastatin (LIPITOR) 80 MG tablet Take 80 mg by mouth daily. 02/13/22   [provider]  bismuth subsalicylate (KAOPECTATE) 262 MG/15ML suspension Take 10 mLs by mouth as needed for diarrhea or loose stools.    [provider]  Carbamide Peroxide (DEBROX OT) Place 5 drops into both ears as needed (ear wax).    [provider]  carvedilol (COREG) 3.125 MG tablet Take 3.125 mg by mouth 2 (two) times daily. 10/05/19   [provider]  cetirizine (ZYRTEC) 5 MG tablet Take 5 mg by mouth at bedtime as needed for allergies.    [provider]  chlorthalidone (HYGROTON) 25 MG tablet Take 25 mg by mouth  daily. 12/30/21   [provider]  dextromethorphan-guaiFENesin (TUSSIN DM) 10-100 MG/5ML liquid Take 10 mLs by mouth every 4 (four) hours as needed for cough.    [provider]  docusate sodium (COLACE) 100 MG capsule Take 100 mg by mouth 2 (two) times daily.    [provider]  escitalopram (LEXAPRO) 10 MG tablet Take 10 mg by mouth daily.    [provider]  ezetimibe (ZETIA) 10 MG tablet Take 10 mg by mouth daily. 06/23/18   [provider]  Fe Fum-Vit C-Vit B12-FA (TRIGELS-F FORTE) CAPS capsule Take 1 capsule by mouth 2 (two) times daily.    [provider]  Glucose 15 GM/32ML GEL Take 1 packet by mouth as needed (low blood sugar).    [provider]  isosorbide mononitrate (IMDUR) 30 MG 24 hr tablet Take 30 mg by mouth daily.    [provider]  losartan (COZAAR) 100 MG tablet Take 100 mg by mouth daily.     [provider]  magnesium  chloride (SLOW-MAG) 64 MG TBEC SR tablet Take 1 tablet by mouth daily.    [provider]  magnesium hydroxide (MILK OF MAGNESIA) 400 MG/5ML suspension Take 30 mLs by mouth as needed (constipation).    [provider]  Multiple Vitamin (THEREMS PO) Take 1 tablet by mouth daily.    [provider]  nitroGLYCERIN (NITROSTAT) 0.4 MG SL tablet Place 0.4 mg under the tongue every 5 (five) minutes as needed for chest pain. 04/13/18 09/24/28  [provider]  nystatin (MYCOSTATIN/NYSTOP) powder Apply 1 Application topically 2 (two) times daily as needed (skin breakdown).    [provider]  ondansetron (ZOFRAN) 4 MG tablet Take 4 mg by mouth 3 (three) times daily as needed for nausea.    [provider]  OXYGEN Inhale into the lungs. 2lpm for dyspnea or SOB    [provider]  pantoprazole (PROTONIX) 40 MG tablet Take 40 mg by mouth daily. 01/05/22   [provider]  ticagrelor (BRILINTA) 90 MG TABS tablet Take 1 tablet (90  mg total) by mouth 2 (two) times daily. 02/27/23   Earnestine Mealing, MD  Vitamin D, Ergocalciferol, (DRISDOL) 1.25 MG (50000 UNIT) CAPS capsule Take 1 capsule (50,000 Units total) by mouth every 7 (seven) days. 03/12/23   Earnestine Mealing, MD     Allergies  Sulfa antibiotics   Family History   Family History  Problem Relation Age of Onset   Heart attack Mother    Heart disease Mother    Heart attack Father    Heart disease Father    Breast cancer Neg Hx      Physical Exam  Triage Vital Signs: ED Triage Vitals  Encounter Vitals Group     BP      Systolic BP Percentile      Diastolic BP Percentile      Pulse      Resp      Temp      Temp src      SpO2      Weight      Height      Head Circumference      Peak Flow      Pain Score      Pain Loc      Pain Education      Exclude from Growth Chart     Updated Vital Signs: BP 131/62 (BP Location: Right Arm)   Pulse 91   Temp 98.5 F (36.9 C) (Oral)   Resp 18   Ht 5\' 3"  (1.6 m)   Wt 68 kg   SpO2 96%   BMI 26.56 kg/m    General: Awake, no distress.  CV:  RRR.  Good peripheral perfusion.  Resp:  Normal effort.  CTAB. Abd:  No distention.  Other:  1.5cm posterior scalp laceration without active bleeding.  PERRL.  EOMI.  Nose is atraumatic.  No dental malocclusion.  No midline cervical spine tenderness to palpation, step-offs or deformities noted.  Alert and oriented x 3.  CN II-XII grossly intact.  5/5 motor strength and sensation all extremities.   ED Results / Procedures / Treatments  Labs (all labs ordered are listed, but only abnormal results are displayed) Labs Reviewed - No data to display   EKG  None   RADIOLOGY I have independently visualized and interpreted patient's imaging study as well as noted the radiology interpretation:  CT head: No ICH  Official radiology report(s): CT Head Wo Contrast Result Date: 09/19/2023 CLINICAL  DATA:  Head trauma, minor (Age >= 65y) EXAM: CT HEAD WITHOUT  CONTRAST TECHNIQUE: Contiguous axial images were obtained from the base of the skull through the vertex without intravenous contrast. RADIATION DOSE REDUCTION: This exam was performed according to the departmental dose-optimization program which includes automated exposure control, adjustment of the mA and/or kV according to patient size and/or use of iterative reconstruction technique. COMPARISON:  CT head June 16, 2023. FINDINGS: Brain: No evidence of acute infarct, acute hemorrhage, mass lesion, midline shift or hydrocephalus. Prior left MCA territory infarcts. Additional patchy white matter hypodensities, both carotid microvascular disease. Cerebral atrophy. Vascular: No hyperdense vessel.  Calcific atherosclerosis. Skull: No acute fracture.  High left vertex scalp contusion. Sinuses/Orbits: Clear sinuses.  No acute orbital findings. Other: No mastoid effusions. IMPRESSION: 1. No evidence of acute intracranial abnormality. 2. High left vertex scalp contusion without fracture. 3. 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 4.  Cerebral atrophy (ICD10-G31.9). Electronically Signed   By: Feliberto Harts M.D.   On: 09/19/2023 02:28     PROCEDURES:  Critical Care performed: No  .Laceration Repair  Date/Time: 09/19/2023 3:10 AM  Performed by: Irean Hong, MD Authorized by: Irean Hong, MD   Consent:    Consent obtained:  Verbal   Consent given by:  Patient   Risks, benefits, and alternatives were discussed: yes     Risks discussed:  Infection, pain, poor cosmetic result, poor wound healing, need for additional repair and vascular damage Universal protocol:    Patient identity confirmed:  Verbally with patient Anesthesia:    Anesthesia method:  Topical application   Topical anesthesia: PainEase. Laceration details:    Location:  Scalp   Scalp location:  Crown   Length (cm):  1.5   Depth (mm):  2 Treatment:    Area cleansed with:  Saline   Amount of  cleaning:  Standard   Irrigation solution:  Sterile saline   Irrigation method:  Tap   Visualized foreign bodies/material removed: no   Skin repair:    Repair method:  Staples   Number of staples:  4 Approximation:    Approximation:  Close Repair type:    Repair type:  Simple Post-procedure details:    Dressing:  Bulky dressing   Procedure completion:  Tolerated well, no immediate complications    MEDICATIONS ORDERED IN ED: Medications  Tdap (BOOSTRIX) injection 0.5 mL (0.5 mLs Intramuscular Given 09/19/23 0236)     IMPRESSION / MDM / ASSESSMENT AND PLAN / ED COURSE  I reviewed the triage vital signs and the nursing notes.                             83 year old female who presents with scalp laceration status post mechanical fall.  Patient endorses anticoagulant use.  Review of patient's MAR demonstrates she is on baby aspirin.  Will update tetanus, obtain CT head and repair scalp laceration.  Patient's presentation is most consistent with acute complicated illness / injury requiring diagnostic workup.  Clinical Course as of 09/19/23 0710  Tue Sep 19, 2023  0311 Slight oozing after cleansing and staple repair.  No arterial bleeding.  Will place surgicel, pressure dressing and reassess. [JS]  0430 Pressure dressing unwrapped to reveal hemostasis.  There is no active bleeding through the Surgicel.  Dressing replaced with Kerlix.  Strict return precautions given.  Patient verbalizes understanding and agrees with plan of care. [JS]  Clinical Course User Index [JS] Irean Hong, MD   FINAL CLINICAL IMPRESSION(S) / ED DIAGNOSES   Final diagnoses:  Fall, initial encounter  Scalp laceration, initial encounter     Rx / DC Orders   ED Discharge Orders     None        Note:  This document was prepared using Dragon voice recognition software and may include unintentional dictation errors.   Irean Hong, MD 09/19/23 [phone]

## 2023-09-19 NOTE — Progress Notes (Signed)
Location:  Other [facility].  Nursing Home Room Number: [address] Place of Service:  ALF [phone]) Abbey Chatters, NP  PCP: Earnestine Mealing, MD  Patient Care Team: Earnestine Mealing, MD as PCP - General [facility])  Extended Emergency Contact Information Primary Emergency Contact: Clovis Pu Address: [address] [address], [name] [address] [place] of [place] Home Phone: [phone] Relation: Daughter Secondary Emergency Contact: [facility] Mobile Phone: [phone] Relation: Sister  Goals of care: Advanced Directive information    09/19/2023    1:59 AM  Advanced Directives  Does Patient Have a Medical Advance Directive? Yes     Chief Complaint  Patient presents with   Acute Visit    Fall  Discussed the use of AI scribe software for clinical note transcription with the patient, who gave verbal consent to proceed.  HPI:  Pt is a 83 y.o. female seen today for an acute visit after going to the ED for fall and laceration.   The patient experienced a fall last night, resulting in a scalp laceration treated with four staples in the emergency department. A CT scan showed no acute intracranial abnormalities. She has no head pain today with Tylenol available for any head pain. Denies any blurred vision, headache, or weakness today  She has been experiencing burning with urination for the past two to three days and has been using Azo maximum relief, which has provided some relief. No blood in the urine, fever, body aches, chills, or back tenderness. She is attempting to stay hydrated.  She has a history of hypertension, which is well controlled with chlorthalidone, carvedilol, and losartan. No chest pain shortness of breath , dizziness or lightheadedness.  She has a history of diabetes, with an A1c of 6.5 in April 2023, which decreased to 6.2 after dietary changes, and was 6.4 six months ago. She is managing with dietary modifications at this time.   She has  a history of anemia and is currently taking iron once a day.  She had a bone density test five years ago, which showed osteopenia.   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 b.) Doppler 12/30/2014: >70% LICA and 70% RICA. c.) Doppler 10/25/16: >70% Bilater ICAs   Chronic bilateral low back pain with left-sided sciatica    CKD (chronic kidney disease), stage III (HCC)    Coronary artery disease    a.) LHC 12/24/2014 -> small LAD system; diffuse CTO of LAD. 60% RCA. LM and LCx with no sig disease. no intervention; med mgmt.   Degenerative arthritis    Diastolic dysfunction    a.) TTE 09/20/2018: EF 55%, G1DD, mild LAE and RVE, triv-mild panvalvular regurgitation.   Dysarthria    GERD (gastroesophageal reflux disease)    Hepatic steatosis    History of kidney stones    HLD (hyperlipidemia)    Hypertension    Kidney stone    Lumbar radiculopathy    Major depression in remission (HCC)    PVD (peripheral vascular disease) (HCC)    Renal cyst, right 06/02/2015   a.) CT 06/02/2015 --> 4.4 cm RIGHT renal mass; MRI recommended. b.) MRI 06/16/2015 --> 3.8 x 4.2 x 4.0 cm proteinaceous/hemmorhagic cyst.   Subclavian steal syndrome    a.) LHC 12/24/2014 --> tight eccentric 90% ostial stenosis with  damping.   T2DM (type 2 diabetes mellitus) [facility])    Past Surgical History:  Procedure Laterality Date   ABDOMINAL HYSTERECTOMY     AUGMENTATION MAMMAPLASTY Bilateral 1980   BACK SURGERY  09/2018   BICEPT TENODESIS  06/10/2021   Procedure: BICEPS TENODESIS;  Surgeon: Christena Flake, MD;  Location: [facility] ORS;  Service: Orthopedics;;   CARDIAC CATHETERIZATION Left 12/24/2014   Procedure: LEFT HEARD CATHETERIZATION; Location: [facility]; Surgeon: Ander Purpura, MD   CHOLECYSTECTOMY     COLONOSCOPY N/A 02/11/2016   Procedure: COLONOSCOPY;  Surgeon: Scot Jun, MD;  Location: [facility] ENDOSCOPY;  Service: Endoscopy;  Laterality: N/A;   IR CT HEAD LTD  11/16/2021   IR CT HEAD LTD  11/16/2021   IR INTRAVSC STENT CERV CAROTID W/O EMB-PROT MOD SED INC ANGIO  11/18/2021   IR PERCUTANEOUS ART THROMBECTOMY/INFUSION INTRACRANIAL INC DIAG ANGIO  11/16/2021   RADIOLOGY WITH ANESTHESIA N/A 11/16/2021   Procedure: IR WITH ANESTHESIA- CODE STROKE;  Surgeon: Radiologist, Medication, MD;  Location: [facility] OR;  Service: Radiology;  Laterality: N/A;   REVERSE SHOULDER ARTHROPLASTY Right 06/10/2021   Procedure: REVERSE SHOULDER ARTHROPLASTY;  Surgeon: Christena Flake, MD;  Location: [facility] ORS;  Service: Orthopedics;  Laterality: Right;    Allergies  Allergen Reactions   Sulfa Antibiotics Other (See Comments)    Unknown reaction    Outpatient Encounter Medications as of 09/19/2023  Medication Sig   acetaminophen (TYLENOL) 325 MG tablet Take 650 mg by mouth every 4 (four) hours as needed (discomfort, elevated temperature).   alum & mag hydroxide-simeth (MAALOX/MYLANTA) 200-200-20 MG/5 SUSP Apply 30 mLs topically every 4 (four) hours as needed (gas, indigestion, upset stomach).   aspirin 81 MG chewable tablet Chew 1 tablet (81 mg total) by mouth daily.   atorvastatin (LIPITOR) 80 MG tablet Take 80 mg by mouth daily.   bismuth subsalicylate (KAOPECTATE) 262 MG/15ML suspension Take 10 mLs by mouth as needed for diarrhea or loose stools.   Carbamide Peroxide (DEBROX OT) Place 5 drops into both ears as needed (ear wax).   carvedilol (COREG) 3.125 MG tablet Take 3.125 mg by mouth 2 (two) times daily.   cetirizine (ZYRTEC) 5 MG tablet Take 5 mg by mouth at bedtime as needed for allergies.   chlorthalidone (HYGROTON) 25 MG tablet Take 25 mg by mouth daily.   dextromethorphan-guaiFENesin (TUSSIN DM) 10-100 MG/5ML liquid Take 10 mLs by mouth every 4 (four) hours as needed for cough.   docusate sodium (COLACE) 100 MG capsule Take 100 mg by mouth 2 (two) times daily.   escitalopram  (LEXAPRO) 10 MG tablet Take 10 mg by mouth daily.   ezetimibe (ZETIA) 10 MG tablet Take 10 mg by mouth daily.   ferrous sulfate 325 (65 FE) MG EC tablet Take 325 mg by mouth daily with breakfast.   Glucose 15 GM/32ML GEL Take 1 packet by mouth as needed (low blood sugar).   isosorbide mononitrate (IMDUR) 30 MG 24 hr tablet Take 30 mg by mouth daily.   losartan (COZAAR) 100 MG tablet Take 100 mg by mouth daily.    magnesium chloride (SLOW-MAG) 64 MG TBEC SR tablet Take 1 tablet by mouth daily.   magnesium hydroxide (MILK OF MAGNESIA) 400 MG/5ML suspension Take 30 mLs by mouth as needed (constipation).   Multiple Vitamin (THEREMS PO) Take 1 tablet by mouth daily.   nitroGLYCERIN (NITROSTAT) 0.4 MG SL tablet Place 0.4 mg under the tongue every 5 (five) minutes as needed for chest pain.  nystatin (MYCOSTATIN/NYSTOP) powder Apply 1 Application topically 2 (two) times daily as needed (skin breakdown).   ondansetron (ZOFRAN) 4 MG tablet Take 4 mg by mouth 3 (three) times daily as needed for nausea.   OXYGEN Inhale into the lungs. 2lpm for dyspnea or SOB   pantoprazole (PROTONIX) 40 MG tablet Take 40 mg by mouth daily.   ticagrelor (BRILINTA) 90 MG TABS tablet Take 1 tablet (90 mg total) by mouth 2 (two) times daily.   Vitamin D, Ergocalciferol, (DRISDOL) 1.25 MG (50000 UNIT) CAPS capsule Take 1 capsule (50,000 Units total) by mouth every 7 (seven) days.   Fe Fum-Vit C-Vit B12-FA (TRIGELS-F FORTE) CAPS capsule Take 1 capsule by mouth 2 (two) times daily. (Patient not taking: Reported on 09/19/2023)   No facility-administered encounter medications on file as of 09/19/2023.    Review of Systems  Constitutional:  Negative for activity change, appetite change, fatigue and unexpected weight change.  HENT:  Negative for congestion and hearing loss.   Eyes: Negative.   Respiratory:  Negative for cough and shortness of breath.   Cardiovascular:  Negative for chest pain, palpitations and leg swelling.   Gastrointestinal:  Negative for abdominal pain, constipation and diarrhea.  Genitourinary:  Positive for dysuria. Negative for difficulty urinating and vaginal pain.  Musculoskeletal:  Negative for arthralgias and myalgias.  Skin:  Positive for wound. Negative for color change.  Neurological:  Positive for weakness. Negative for dizziness.  Psychiatric/Behavioral:  Negative for agitation, behavioral problems and confusion.     Immunization History  Administered Date(s) Administered   Influenza Inj Mdck Quad Pf 05/02/2018   Influenza Split 05/26/2015   Influenza, High Dose Seasonal PF 05/16/2019, 05/10/2020, 05/25/2021   Influenza-Unspecified 05/01/2012, 05/12/2013, 06/08/2016, 04/28/2017, 05/26/2023   Moderna Covid-19 Vaccine Bivalent Booster 75yrs & up 05/25/2021   PFIZER Comirnaty(Gray Top)Covid-19 Tri-Sucrose Vaccine 08/15/2019, 09/07/2019, 05/10/2020   PFIZER(Purple Top)SARS-COV-2 Vaccination 08/15/2019, 09/07/2019, 05/02/2020, 11/25/2020, 05/05/2023   Pneumococcal Conjugate-13 05/26/2015   Pneumococcal Polysaccharide-23 08/09/2011   Tdap 09/19/2023   Zoster Recombinant(Shingrix) 08/30/2018, 01/03/2019   Zoster, Live 08/09/2011   Pertinent  Health Maintenance Due  Topic Date Due   FOOT EXAM  Never done   OPHTHALMOLOGY EXAM  Never done   DEXA SCAN  Never done   HEMOGLOBIN A1C  09/13/2023   INFLUENZA VACCINE  Completed      11/23/2021   11:00 AM 11/23/2021    8:00 PM 11/24/2021    9:00 AM 11/24/2021    8:10 PM 11/25/2021    8:00 AM  Fall Risk  (RETIRED) Patient Fall Risk Level Moderate fall risk Moderate fall risk Moderate fall risk Moderate fall risk Moderate fall risk   Functional Status Survey:    Vitals:   09/19/23 1235  BP: 135/66  Pulse: 70  Resp: 17  Temp: 97.8 F (36.6 C)  SpO2: 92%  Weight: 158 lb 8 oz (71.9 kg)  Height: 5\' 3"  (1.6 m)   Body mass index is 28.08 kg/m. Physical Exam Constitutional:      General: She is not in acute distress.     Appearance: She is well-developed. She is not diaphoretic.  HENT:     Head: Normocephalic and atraumatic.     Mouth/Throat:     Pharynx: No oropharyngeal exudate.  Eyes:     Conjunctiva/sclera: Conjunctivae normal.     Pupils: Pupils are equal, round, and reactive to light.  Cardiovascular:     Rate and Rhythm: Normal rate and regular rhythm.     Heart  sounds: Normal heart sounds.  Pulmonary:     Effort: Pulmonary effort is normal.     Breath sounds: Normal breath sounds.  Abdominal:     General: Bowel sounds are normal.     Palpations: Abdomen is soft.  Musculoskeletal:     Cervical back: Normal range of motion and neck supple.     Right lower leg: No edema.     Left lower leg: No edema.  Skin:    General: Skin is warm and dry.  Neurological:     Mental Status: She is alert.  Psychiatric:        Mood and Affect: Mood normal.     Labs reviewed: Recent Labs    12/19/22 2251 12/20/22 0450 12/21/22 0518 03/13/23 1029 03/13/23 1034  NA  --  139 139 139 140  K  --  3.2* 3.8 3.6 3.5  CL  --  109 110 102 104  CO2  --  23 23 26   --   GLUCOSE  --  111* 114* 108* 103*  BUN  --  29* 26* 15 16  CREATININE  --  1.28* 1.15* 1.07* 1.10*  CALCIUM  --  7.8* 7.8* 9.1  --   MG 1.4*  --   --   --   --    Recent Labs    12/20/22 0450 03/13/23 1029  AST 17 29  ALT 15 26  ALKPHOS 49 51  BILITOT 0.8 0.7  PROT 6.1* 6.9  ALBUMIN 3.3* 3.6   Recent Labs    12/19/22 1325 12/19/22 2251 12/20/22 0450 12/21/22 0518 03/02/23 0000 03/13/23 1029 03/13/23 1034  WBC 9.6  --  11.0* 9.5 8.0 7.2  --   NEUTROABS 7.0  --   --   --  4,992.00 4.6  --   HGB 11.4*   < > 9.7* 8.6* 13.0 13.4 13.9  HCT 34.3*   < > 30.9* 26.4* 40 41.2 41.0  MCV 91.7  --  96.3 93.0  --  94.7  --   PLT 241  --  197 210 249 256  --    < > = values in this interval not displayed.   No results found for: "TSH" Lab Results  Component Value Date   HGBA1C 6.4 (H) 03/13/2023   Lab Results  Component Value Date    CHOL 112 03/14/2023   HDL 48 03/14/2023   LDLCALC 41 03/14/2023   TRIG 117 03/14/2023   CHOLHDL 2.3 03/14/2023    Significant Diagnostic Results in last 30 days:  CT Head Wo Contrast Result Date: 09/19/2023 CLINICAL DATA:  Head trauma, minor (Age >= 65y) EXAM: CT HEAD WITHOUT CONTRAST TECHNIQUE: Contiguous axial images were obtained from the base of the skull through the vertex without intravenous contrast. RADIATION DOSE REDUCTION: This exam was performed according to the departmental dose-optimization program which includes automated exposure control, adjustment of the mA and/or kV according to patient size and/or use of iterative reconstruction technique. COMPARISON:  CT head June 16, 2023. FINDINGS: Brain: No evidence of acute infarct, acute hemorrhage, mass lesion, midline shift or hydrocephalus. Prior left MCA territory infarcts. Additional patchy white matter hypodensities, both carotid microvascular disease. Cerebral atrophy. Vascular: No hyperdense vessel.  Calcific atherosclerosis. Skull: No acute fracture.  High left vertex scalp contusion. Sinuses/Orbits: Clear sinuses.  No acute orbital findings. Other: No mastoid effusions. IMPRESSION: 1. No evidence of acute intracranial abnormality. 2. High left vertex scalp contusion without fracture. 3. 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 4.  Cerebral atrophy (ICD10-G31.9). Electronically Signed   By: Feliberto Harts M.D.   On: 09/19/2023 02:28    Assessment/Plan No problem-specific Assessment & Plan notes found for this encounter.  Fall with Scalp Laceration Fell upon standing, no loss of consciousness. CT head negative for acute intracranial abnormality. Scalp laceration repaired with 4 staples in the ED. No current head pain. -Continue Tylenol PRN for pain. -Remove staples in 1 week. PT/OT consult  Dysuria  Reports burning with urination for 2-3 days. No hematuria, fever, back pain, or  systemic symptoms. Partial relief with Azo maximum. -Collect urine for culture and sensitivity. -Encourage increased fluid intake.  Hypertension Well controlled on Chlorthalidone, losartan, and Carvedilol.  Follow up BMP -Continue current regimen.  Diabetes Mellitus Borderline prediabetic to diabetic based on previous A1c results. No current symptoms. -Order A1c with upcoming labs. -Ensure diabetic eye exam is performed by eye doctor. -Order urine microalbumin.  Anemia History of anemia, currently on iron supplementation. -Order CBC with upcoming labs.  Osteopenia Bone density scan 5 years ago showed osteopenia. -follow up bone density   General Health Maintenance -Order CMP, CBC, A1c, and urine microalbumin with next labs. -Ensure diabetic eye exam is performed by eye doctor.   Janene Harvey. Biagio Borg [facility] [phone]

## 2023-09-19 NOTE — ED Triage Notes (Signed)
[facility],  ATTEMPTED TO USE WALKER TO AMBULATE TO THE BATHROOM, FELL STRIKING HER POSTERIOR LOBE,  DENIED LOC, PATIENT TAKES BLOOD THINNERS FOR ATRIAL FIB.

## 2023-09-19 NOTE — Discharge Instructions (Signed)
Staple removal in 7 to 10 days.  Return to the ER for worsening symptoms, persistent vomiting, lethargy or other concerns. ?

## 2023-09-21 DIAGNOSIS — D649 Anemia, unspecified: Secondary | ICD-10-CM | POA: Diagnosis not present

## 2023-09-21 DIAGNOSIS — E1122 Type 2 diabetes mellitus with diabetic chronic kidney disease: Secondary | ICD-10-CM | POA: Diagnosis not present

## 2023-09-21 DIAGNOSIS — I10 Essential (primary) hypertension: Secondary | ICD-10-CM | POA: Diagnosis not present

## 2023-09-27 DIAGNOSIS — M810 Age-related osteoporosis without current pathological fracture: Secondary | ICD-10-CM | POA: Diagnosis not present

## 2023-10-12 DIAGNOSIS — I5032 Chronic diastolic (congestive) heart failure: Secondary | ICD-10-CM | POA: Diagnosis not present

## 2023-10-12 DIAGNOSIS — R2689 Other abnormalities of gait and mobility: Secondary | ICD-10-CM | POA: Diagnosis not present

## 2023-10-12 DIAGNOSIS — R278 Other lack of coordination: Secondary | ICD-10-CM | POA: Diagnosis not present

## 2023-10-12 DIAGNOSIS — M6281 Muscle weakness (generalized): Secondary | ICD-10-CM | POA: Diagnosis not present

## 2023-10-12 DIAGNOSIS — Z9181 History of falling: Secondary | ICD-10-CM | POA: Diagnosis not present

## 2023-10-12 DIAGNOSIS — I2510 Atherosclerotic heart disease of native coronary artery without angina pectoris: Secondary | ICD-10-CM | POA: Diagnosis not present

## 2023-10-12 DIAGNOSIS — I129 Hypertensive chronic kidney disease with stage 1 through stage 4 chronic kidney disease, or unspecified chronic kidney disease: Secondary | ICD-10-CM | POA: Diagnosis not present

## 2023-10-12 DIAGNOSIS — Z741 Need for assistance with personal care: Secondary | ICD-10-CM | POA: Diagnosis not present

## 2023-10-12 DIAGNOSIS — G8191 Hemiplegia, unspecified affecting right dominant side: Secondary | ICD-10-CM | POA: Diagnosis not present

## 2023-10-12 DIAGNOSIS — I739 Peripheral vascular disease, unspecified: Secondary | ICD-10-CM | POA: Diagnosis not present

## 2023-10-12 DIAGNOSIS — N1831 Chronic kidney disease, stage 3a: Secondary | ICD-10-CM | POA: Diagnosis not present

## 2023-10-26 ENCOUNTER — Other Ambulatory Visit: Payer: Self-pay | Admitting: Student

## 2023-10-26 DIAGNOSIS — F325 Major depressive disorder, single episode, in full remission: Secondary | ICD-10-CM

## 2023-10-26 DIAGNOSIS — I6602 Occlusion and stenosis of left middle cerebral artery: Secondary | ICD-10-CM

## 2023-10-26 DIAGNOSIS — I739 Peripheral vascular disease, unspecified: Secondary | ICD-10-CM

## 2023-10-26 DIAGNOSIS — E1122 Type 2 diabetes mellitus with diabetic chronic kidney disease: Secondary | ICD-10-CM

## 2023-10-26 DIAGNOSIS — I159 Secondary hypertension, unspecified: Secondary | ICD-10-CM

## 2023-10-26 DIAGNOSIS — F0390 Unspecified dementia without behavioral disturbance: Secondary | ICD-10-CM

## 2023-10-26 DIAGNOSIS — I779 Disorder of arteries and arterioles, unspecified: Secondary | ICD-10-CM

## 2023-10-26 MED ORDER — EZETIMIBE 10 MG PO TABS
10.0000 mg | ORAL_TABLET | Freq: Every day | ORAL | 1 refills | Status: AC
Start: 2023-10-26 — End: ?

## 2023-10-26 MED ORDER — CHLORTHALIDONE 25 MG PO TABS: 25.0000 mg | ORAL_TABLET | Freq: Every day | ORAL | 1 refills | Status: DC

## 2023-10-26 MED ORDER — ISOSORBIDE MONONITRATE ER 30 MG PO TB24
30.0000 mg | ORAL_TABLET | Freq: Every day | ORAL | 1 refills | Status: DC
Start: 2023-10-26 — End: 2023-11-29

## 2023-10-26 MED ORDER — ESCITALOPRAM OXALATE 10 MG PO TABS
10.0000 mg | ORAL_TABLET | Freq: Every day | ORAL | 1 refills | Status: AC
Start: 2023-10-26 — End: ?

## 2023-10-26 MED ORDER — CLOPIDOGREL BISULFATE 75 MG PO TABS
75.0000 mg | ORAL_TABLET | Freq: Every day | ORAL | 3 refills | Status: AC
Start: 2023-10-26 — End: 2024-10-25

## 2023-10-26 MED ORDER — ATORVASTATIN CALCIUM 80 MG PO TABS
80.0000 mg | ORAL_TABLET | Freq: Every day | ORAL | 1 refills | Status: AC
Start: 2023-10-26 — End: ?

## 2023-10-26 MED ORDER — LOSARTAN POTASSIUM 100 MG PO TABS
100.0000 mg | ORAL_TABLET | Freq: Every day | ORAL | 1 refills | Status: AC
Start: 2023-10-26 — End: ?

## 2023-10-26 NOTE — Progress Notes (Signed)
 Refill of medications to [facility].

## 2023-10-30 DIAGNOSIS — E1159 Type 2 diabetes mellitus with other circulatory complications: Secondary | ICD-10-CM | POA: Diagnosis not present

## 2023-10-30 DIAGNOSIS — M2042 Other hammer toe(s) (acquired), left foot: Secondary | ICD-10-CM | POA: Diagnosis not present

## 2023-10-30 DIAGNOSIS — B351 Tinea unguium: Secondary | ICD-10-CM | POA: Diagnosis not present

## 2023-10-30 DIAGNOSIS — M2041 Other hammer toe(s) (acquired), right foot: Secondary | ICD-10-CM | POA: Diagnosis not present

## 2023-11-03 DIAGNOSIS — M21371 Foot drop, right foot: Secondary | ICD-10-CM | POA: Diagnosis not present

## 2023-11-03 DIAGNOSIS — M21171 Varus deformity, not elsewhere classified, right ankle: Secondary | ICD-10-CM | POA: Diagnosis not present

## 2023-11-23 ENCOUNTER — Other Ambulatory Visit: Payer: Self-pay

## 2023-11-23 ENCOUNTER — Emergency Department
Admission: EM | Admit: 2023-11-23 | Discharge: 2023-11-23 | Disposition: A | Discharge status: Skilled Nursing Facility | Attending: Emergency Medicine | Admitting: Emergency Medicine

## 2023-11-23 ENCOUNTER — Emergency Department

## 2023-11-23 DIAGNOSIS — E1122 Type 2 diabetes mellitus with diabetic chronic kidney disease: Secondary | ICD-10-CM | POA: Diagnosis not present

## 2023-11-23 DIAGNOSIS — I129 Hypertensive chronic kidney disease with stage 1 through stage 4 chronic kidney disease, or unspecified chronic kidney disease: Secondary | ICD-10-CM | POA: Insufficient documentation

## 2023-11-23 DIAGNOSIS — R079 Chest pain, unspecified: Secondary | ICD-10-CM | POA: Insufficient documentation

## 2023-11-23 DIAGNOSIS — I491 Atrial premature depolarization: Secondary | ICD-10-CM | POA: Diagnosis not present

## 2023-11-23 DIAGNOSIS — N189 Chronic kidney disease, unspecified: Secondary | ICD-10-CM | POA: Diagnosis not present

## 2023-11-23 DIAGNOSIS — I2510 Atherosclerotic heart disease of native coronary artery without angina pectoris: Secondary | ICD-10-CM | POA: Diagnosis not present

## 2023-11-23 DIAGNOSIS — R0789 Other chest pain: Secondary | ICD-10-CM | POA: Diagnosis not present

## 2023-11-23 DIAGNOSIS — R918 Other nonspecific abnormal finding of lung field: Secondary | ICD-10-CM | POA: Diagnosis not present

## 2023-11-23 DIAGNOSIS — R0902 Hypoxemia: Secondary | ICD-10-CM | POA: Diagnosis not present

## 2023-11-23 DIAGNOSIS — I10 Essential (primary) hypertension: Secondary | ICD-10-CM | POA: Diagnosis not present

## 2023-11-23 LAB — COMPREHENSIVE METABOLIC PANEL WITH GFR
ALT: 29 U/L (ref 0–44)
AST: 25 U/L (ref 15–41)
Albumin: 3.7 g/dL (ref 3.5–5.0)
Alkaline Phosphatase: 50 U/L (ref 38–126)
Anion gap: 9 (ref 5–15)
BUN: 23 mg/dL (ref 8–23)
CO2: 27 mmol/L (ref 22–32)
Calcium: 9.2 mg/dL (ref 8.9–10.3)
Chloride: 100 mmol/L (ref 98–111)
Creatinine, Ser: 1.2 mg/dL — ABNORMAL HIGH (ref 0.44–1.00)
GFR, Estimated: 45 mL/min — ABNORMAL LOW (ref >60)
Glucose, Bld: 121 mg/dL — ABNORMAL HIGH (ref 70–99)
Potassium: 3.4 mmol/L — ABNORMAL LOW (ref 3.5–5.1)
Sodium: 136 mmol/L (ref 135–145)
Total Bilirubin: 0.7 mg/dL (ref 0.0–1.2)
Total Protein: 6.8 g/dL (ref 6.5–8.1)

## 2023-11-23 LAB — CBC WITH DIFFERENTIAL/PLATELET
Abs Immature Granulocytes: 0.02 10*3/uL (ref 0.00–0.07)
Basophils Absolute: 0.1 10*3/uL (ref 0.0–0.1)
Basophils Relative: 1 %
Eosinophils Absolute: 0.1 10*3/uL (ref 0.0–0.5)
Eosinophils Relative: 1 %
HCT: 39.1 % (ref 36.0–46.0)
Hemoglobin: 13.1 g/dL (ref 12.0–15.0)
Immature Granulocytes: 0 %
Lymphocytes Relative: 24 %
Lymphs Abs: 2.3 10*3/uL (ref 0.7–4.0)
MCH: 32.6 pg (ref 26.0–34.0)
MCHC: 33.5 g/dL (ref 30.0–36.0)
MCV: 97.3 fL (ref 80.0–100.0)
Monocytes Absolute: 0.7 10*3/uL (ref 0.1–1.0)
Monocytes Relative: 8 %
Neutro Abs: 6.2 10*3/uL (ref 1.7–7.7)
Neutrophils Relative %: 66 %
Platelets: 274 10*3/uL (ref 150–400)
RBC: 4.02 MIL/uL (ref 3.87–5.11)
RDW: 12.3 % (ref 11.5–15.5)
WBC: 9.4 10*3/uL (ref 4.0–10.5)
nRBC: 0 % (ref 0.0–0.2)

## 2023-11-23 LAB — TROPONIN I (HIGH SENSITIVITY)
Troponin I (High Sensitivity): 5 ng/L (ref <18)
Troponin I (High Sensitivity): 5 ng/L (ref <18)

## 2023-11-23 LAB — BRAIN NATRIURETIC PEPTIDE: B Natriuretic Peptide: 15.8 pg/mL (ref 0.0–100.0)

## 2023-11-23 NOTE — ED Provider Notes (Signed)
 [facility] Provider Note   Event Date/Time   First MD Initiated Contact with Patient 11/23/23 0827     (approximate) History  Chest Pain (Substernal cp radiating to back)  HPI Cassidy Hernandez is a 83 y.o. female with a stated past medical history of coronary artery disease with recurrent angina, CKD, hypertension, and diabetes who presents complaining of substernal chest pain that was 5/10 and increased to 8/10 over the course of the morning until she received nitroglycerin and 324 mg aspirin prior to arrival with resolution of her symptoms.  Patient states that the chest pain has happened to her before and the cardiologists are "just watching it".  Patient endorses fatigue but denies any specific shortness of breath.  Patient is unclear as to whether exertion has worsened this pain as she did not want to do anything when the pain came on.  Patient currently denies any chest pain ROS: Patient currently denies any vision changes, tinnitus, difficulty speaking, facial droop, sore throat, shortness of breath, abdominal pain, nausea/vomiting/diarrhea, dysuria, or weakness/numbness/paresthesias in any extremity   Physical Exam  Triage Vital Signs: ED Triage Vitals [11/23/23 0825]  Encounter Vitals Group     BP      Systolic BP Percentile      Diastolic BP Percentile      Pulse      Resp      Temp      Temp src      SpO2 96 %     Weight      Height      Head Circumference      Peak Flow      Pain Score      Pain Loc      Pain Education      Exclude from Growth Chart    Most recent vital signs: Vitals:   11/23/23 0900 11/23/23 0938  BP: (!) 123/57 130/77  Pulse: 70 85  Resp: 15 19  Temp:    SpO2: 90% 93%   General: Awake, oriented x4. CV:  Good peripheral perfusion.  Resp:  Normal effort.  Abd:  No distention.  Other:  Elderly obese Caucasian female resting comfortably in no acute distress ED Results / Procedures / Treatments  Labs (all labs ordered  are listed, but only abnormal results are displayed) Labs Reviewed  COMPREHENSIVE METABOLIC PANEL WITH GFR - Abnormal; Notable for the following components:      Result Value   Potassium 3.4 (*)    Glucose, Bld 121 (*)    Creatinine, Ser 1.20 (*)    GFR, Estimated 45 (*)    All other components within normal limits  BRAIN NATRIURETIC PEPTIDE  CBC WITH DIFFERENTIAL/PLATELET  TROPONIN I (HIGH SENSITIVITY)  TROPONIN I (HIGH SENSITIVITY)   EKG ED ECG REPORT I, Merwyn Katos, the attending physician, personally viewed and interpreted this ECG. Date: 11/23/2023 EKG Time: 0829 Rate: 68 Rhythm: normal sinus rhythm QRS Axis: normal Intervals: normal ST/T Wave abnormalities: normal Narrative Interpretation: no evidence of acute ischemia RADIOLOGY ED MD interpretation: One-view portable chest x-ray interpreted by me shows no evidence of acute abnormalities including no pneumonia, pneumothorax, or widened mediastinum -Agree with radiology assessment Official radiology report(s): DG Chest Port 1 View Result Date: 11/23/2023 CLINICAL DATA:  Chest pain EXAM: PORTABLE CHEST 1 VIEW COMPARISON:  03/13/2023 FINDINGS: The lungs are clear without focal pneumonia, edema, pneumothorax or pleural effusion. Streaky densities at the left base are compatible with atelectasis or scarring. The  cardiopericardial silhouette is within normal limits for size. No acute bony abnormality. Telemetry leads overlie the chest. IMPRESSION: Subtle left base atelectasis or scarring. Otherwise no acute findings. Electronically Signed   By: Donnal Fusi M.D.   On: 11/23/2023 09:34   PROCEDURES: Critical Care performed: No .1-3 Lead EKG Interpretation  Performed by: Charleen Conn, MD Authorized by: Charleen Conn, MD     Interpretation: normal     ECG rate:  81   ECG rate assessment: normal     Rhythm: sinus rhythm     Ectopy: none     Conduction: normal    MEDICATIONS ORDERED IN ED: Medications - No data to  display IMPRESSION / MDM / ASSESSMENT AND PLAN / ED COURSE  I reviewed the triage vital signs and the nursing notes.                             The patient is on the cardiac monitor to evaluate for evidence of arrhythmia and/or significant heart rate changes. Patient's presentation is most consistent with acute presentation with potential threat to life or bodily function. 83 year old female presents for substernal chest pain that radiates through to her back Workup: ECG, CXR, CBC, BMP, Troponin Findings: ECG: No overt evidence of STEMI. No evidence of Brugada's sign, delta wave, epsilon wave, significantly prolonged QTc, or malignant arrhythmia HS Troponin: Negative x1 Other Labs unremarkable for emergent problems. CXR: Without PTX, PNA, or widened mediastinum Last Heart Catheterization:  12/24/2014 HEART Score: 4  Given History, Exam, and Workup I have low suspicion for ACS, Pneumothorax, Pneumonia, Pulmonary Embolus, Tamponade, Aortic Dissection or other emergent problem as a cause for this presentation.   Reassesment: Prior to discharge patient's pain was controlled and they were well appearing.  Disposition:  Discharge. Strict return precautions discussed with patient with full understanding. Advised patient to follow up promptly with primary care provider    FINAL CLINICAL IMPRESSION(S) / ED DIAGNOSES   Final diagnoses:  Chest pain, unspecified type   Rx / DC Orders   ED Discharge Orders     None      Note:  This document was prepared using Dragon voice recognition software and may include unintentional dictation errors.   [name] K, MD 11/23/23 [phone]

## 2023-11-23 NOTE — ED Notes (Signed)
 Pt placed on bedpan to void.

## 2023-11-23 NOTE — ED Triage Notes (Signed)
 82yoF BIBEMS from [facility]. C/o chest pain radiating to the back, initially 3/10, increased to 10/10, nitroglycerin x 1, cp completely resolved. 0/10 on arrival to hospital. 324mg  ASA given po by EMS.   PMH angina, DM, CVA with right side residual, speech is at her baseline.  Fall on Monday.

## 2023-11-23 NOTE — ED Notes (Signed)
 [facility]  called for  transport to  [facility]

## 2023-11-26 ENCOUNTER — Emergency Department (HOSPITAL_COMMUNITY)
Admission: EM | Admit: 2023-11-26 | Discharge: 2023-11-26 | Disposition: A | Discharge status: Home / Self Care | Attending: Emergency Medicine | Admitting: Emergency Medicine

## 2023-11-26 ENCOUNTER — Emergency Department (HOSPITAL_COMMUNITY)

## 2023-11-26 ENCOUNTER — Other Ambulatory Visit: Payer: Self-pay

## 2023-11-26 DIAGNOSIS — W19XXXA Unspecified fall, initial encounter: Secondary | ICD-10-CM | POA: Diagnosis not present

## 2023-11-26 DIAGNOSIS — S0990XA Unspecified injury of head, initial encounter: Secondary | ICD-10-CM | POA: Diagnosis not present

## 2023-11-26 DIAGNOSIS — I700 Atherosclerosis of aorta: Secondary | ICD-10-CM | POA: Diagnosis not present

## 2023-11-26 DIAGNOSIS — S0003XA Contusion of scalp, initial encounter: Secondary | ICD-10-CM | POA: Diagnosis not present

## 2023-11-26 DIAGNOSIS — Z96611 Presence of right artificial shoulder joint: Secondary | ICD-10-CM | POA: Diagnosis not present

## 2023-11-26 DIAGNOSIS — I517 Cardiomegaly: Secondary | ICD-10-CM | POA: Diagnosis not present

## 2023-11-26 DIAGNOSIS — R296 Repeated falls: Secondary | ICD-10-CM | POA: Diagnosis not present

## 2023-11-26 DIAGNOSIS — R531 Weakness: Secondary | ICD-10-CM | POA: Insufficient documentation

## 2023-11-26 DIAGNOSIS — R079 Chest pain, unspecified: Secondary | ICD-10-CM | POA: Diagnosis not present

## 2023-11-26 DIAGNOSIS — S299XXA Unspecified injury of thorax, initial encounter: Secondary | ICD-10-CM | POA: Diagnosis not present

## 2023-11-26 DIAGNOSIS — I6782 Cerebral ischemia: Secondary | ICD-10-CM | POA: Diagnosis not present

## 2023-11-26 DIAGNOSIS — R030 Elevated blood-pressure reading, without diagnosis of hypertension: Secondary | ICD-10-CM | POA: Insufficient documentation

## 2023-11-26 DIAGNOSIS — I10 Essential (primary) hypertension: Secondary | ICD-10-CM | POA: Diagnosis not present

## 2023-11-26 LAB — BASIC METABOLIC PANEL WITH GFR
Anion gap: 12 (ref 5–15)
BUN: 26 mg/dL — ABNORMAL HIGH (ref 8–23)
CO2: 27 mmol/L (ref 22–32)
Calcium: 9.8 mg/dL (ref 8.9–10.3)
Chloride: 95 mmol/L — ABNORMAL LOW (ref 98–111)
Creatinine, Ser: 1.22 mg/dL — ABNORMAL HIGH (ref 0.44–1.00)
GFR, Estimated: 43 mL/min — ABNORMAL LOW (ref >60)
Glucose, Bld: 138 mg/dL — ABNORMAL HIGH (ref 70–99)
Potassium: 3.5 mmol/L (ref 3.5–5.1)
Sodium: 134 mmol/L — ABNORMAL LOW (ref 135–145)

## 2023-11-26 LAB — CBC WITH DIFFERENTIAL/PLATELET
Abs Immature Granulocytes: 0.04 10*3/uL (ref 0.00–0.07)
Basophils Absolute: 0.1 10*3/uL (ref 0.0–0.1)
Basophils Relative: 1 %
Eosinophils Absolute: 0.1 10*3/uL (ref 0.0–0.5)
Eosinophils Relative: 1 %
HCT: 39.5 % (ref 36.0–46.0)
Hemoglobin: 13.6 g/dL (ref 12.0–15.0)
Immature Granulocytes: 0 %
Lymphocytes Relative: 23 %
Lymphs Abs: 2.4 10*3/uL (ref 0.7–4.0)
MCH: 32.8 pg (ref 26.0–34.0)
MCHC: 34.4 g/dL (ref 30.0–36.0)
MCV: 95.2 fL (ref 80.0–100.0)
Monocytes Absolute: 1.1 10*3/uL — ABNORMAL HIGH (ref 0.1–1.0)
Monocytes Relative: 10 %
Neutro Abs: 7.1 10*3/uL (ref 1.7–7.7)
Neutrophils Relative %: 65 %
Platelets: 292 10*3/uL (ref 150–400)
RBC: 4.15 MIL/uL (ref 3.87–5.11)
RDW: 12.1 % (ref 11.5–15.5)
WBC: 10.8 10*3/uL — ABNORMAL HIGH (ref 4.0–10.5)
nRBC: 0 % (ref 0.0–0.2)

## 2023-11-26 MED ORDER — ACETAMINOPHEN 325 MG PO TABS
650.0000 mg | ORAL_TABLET | Freq: Once | ORAL | Status: AC
  Administered 2023-11-26: 650 mg via ORAL
  Filled 2023-11-26: qty 2

## 2023-11-26 NOTE — ED Notes (Signed)
[facility] CALLED  °

## 2023-11-26 NOTE — Discharge Instructions (Addendum)
 Cassidy Hernandez comes to the emergency room after a fall.  She has had recurrent falls this week.  We recommend that she be seen by primary doctor or nurse practitioner at the facility. We recommend that she get physical therapy assessment done.  Her BP in the ER was elevated, but she states that her blood pressure normally is fine.  We recommend close monitoring of her BP to see if there is any adjustments needed to her BP medications.

## 2023-11-26 NOTE — ED Notes (Signed)
 Patient transported to CT

## 2023-11-26 NOTE — Progress Notes (Signed)
 Orthopedic Tech Progress Note Patient Details:  Cassidy Hernandez [birth year] [id number] Level 2 Trauma  Patient ID: Cassidy Hernandez, female   DOB: Oct 21, 1940, 83 y.o.   MRN: [id number]  Cassidy Hernandez 11/26/2023, 6:53 PM

## 2023-11-26 NOTE — ED Triage Notes (Addendum)
 Pt had mechanical fall , hematoma to back of right side of head. Alert and oriented x4. Complaining of head pain. Slurred speech at baseline. Hx of stroke with right sided deficits. No LOC. Takes plavix. Has had many recent falls. Doesn't remember the other falls per EMS.

## 2023-11-26 NOTE — ED Provider Notes (Signed)
 [facility] Provider Note   CSN: [id number] Arrival date & time: 11/26/23  1850     History  Chief Complaint  Patient presents with   Fall    L2 on thinners    NALIA HONEYCUTT is a 83 y.o. female.  HPI    83 year old female comes in with chief complaint of fall. Pt is on plavix . Pt has hx of stroke with R sided weakness and she resides at assisted living facility. Pt has baseline balance issues. Today, she was ambulating ad lost her balance and fell backwards. Pt has headache, denies loc.   Pt states that she has had 2-3 falls in the last week. She denies any n/v/f/c. Denies any new weakness, but balance is worse. She feels that the falls are due to R leg weakness. She denies any associated new slurring of speech, numbness.   Home Medications Prior to Admission medications   Not on File      Allergies    Patient has no allergy information on record.    Review of Systems   Review of Systems  All other systems reviewed and are negative.   Physical Exam Updated Vital Signs BP (!) 198/79   Pulse 100   Temp 98.1 F (36.7 C) (Oral)   Resp (!) 21   Ht 5\' 2"  (1.575 m)   Wt 68 kg   SpO2 96%   BMI 27.44 kg/m  Physical Exam Vitals and nursing note reviewed.  Constitutional:      Appearance: She is well-developed.  HENT:     Head: Atraumatic.  Eyes:     Extraocular Movements: Extraocular movements intact.  Neck:     Comments: No midline c-spine tenderness, pt able to turn head to 45 degrees bilaterally without any pain and able to flex neck to the chest and extend without any pain or neurologic symptoms.  Cardiovascular:     Rate and Rhythm: Normal rate.  Pulmonary:     Effort: Pulmonary effort is normal.  Musculoskeletal:     Comments: Pt has posterior scalp hematoma. No bleeding.  OTHERWISE:  Head to toe evaluation shows no other hematoma, bleeding of the scalp, no facial abrasions, no spine step offs, crepitus  of the chest or neck, no tenderness to palpation of the bilateral upper and lower extremities, no gross deformities, no chest tenderness, no pelvic pain.   Skin:    General: Skin is dry.  Neurological:     Mental Status: She is alert and oriented to person, place, and time.     Motor: Weakness present.     Comments: LOWER AND UPPER EXTREMITY STRENGTH IS 4/5, subjective right-sided weakness in the lower extremity     ED Results / Procedures / Treatments   Labs (all labs ordered are listed, but only abnormal results are displayed) Labs Reviewed  BASIC METABOLIC PANEL WITH GFR - Abnormal; Notable for the following components:      Result Value   Sodium 134 (*)    Chloride 95 (*)    Glucose, Bld 138 (*)    BUN 26 (*)    Creatinine, Ser 1.22 (*)    GFR, Estimated 43 (*)    All other components within normal limits  CBC WITH DIFFERENTIAL/PLATELET - Abnormal; Notable for the following components:   WBC 10.8 (*)    Monocytes Absolute 1.1 (*)    All other components within normal limits    EKG None  Radiology CT HEAD  WO CONTRAST Result Date: 11/26/2023 CLINICAL DATA:  Head trauma, moderate-to-severe. EXAM: CT HEAD WITHOUT CONTRAST TECHNIQUE: Contiguous axial images were obtained from the base of the skull through the vertex without intravenous contrast. RADIATION DOSE REDUCTION: This exam was performed according to the departmental dose-optimization program which includes automated exposure control, adjustment of the mA and/or kV according to patient size and/or use of iterative reconstruction technique. COMPARISON:  09/19/2023. FINDINGS: Brain: There is no acute intracranial hemorrhage, midline shift or mass effect. No extra-axial fluid collection is seen. Examination is limited due to artifact. Diffuse atrophy is noted. Periventricular white matter hypodensities are present bilaterally. No hydrocephalus is seen. 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 Vascular: No  hyperdense vessel or unexpected calcification. Skull: Normal. Negative for fracture or focal lesion. Sinuses/Orbits: No acute finding. Other: A large scalp hematoma is present over the parietal bone on the right. IMPRESSION: 1. No acute intracranial process. 2. 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 Electronically Signed   By: Wyvonnia Heimlich M.D.   On: 11/26/2023 20:14   DG Chest Port 1 View Result Date: 11/26/2023 CLINICAL DATA:  Trauma, fall. EXAM: PORTABLE CHEST 1 VIEW COMPARISON:  11/23/2023. FINDINGS: The heart mildly enlarged and the mediastinal contour stable. There is atherosclerotic calcification of the aorta. Subsegmental atelectasis or scarring is noted in the mid left lung. No consolidation, effusion, or pneumothorax. Shoulder arthroplasty changes are present on the right. No acute fracture is seen. Bilateral breast implants are noted. IMPRESSION: Stable chest with no acute process. Electronically Signed   By: Wyvonnia Heimlich M.D.   On: 11/26/2023 20:08    Procedures Procedures    Medications Ordered in ED Medications  acetaminophen  (TYLENOL ) tablet 650 mg (has no administration in time range)    ED Course/ Medical Decision Making/ A&P                                 Medical Decision Making Amount and/or Complexity of Data Reviewed Labs: ordered. Radiology: ordered.  Risk OTC drugs.   83 year old patient comes in after sustaining what appears to be a mechanical fall. Pertinent past medical includes : STROKE, PLAVIX  USE. Collateral history provided by   Based on my history and exam, differential diagnosis includes: - Traumatic brain injury including intracranial hemorrhage - Long bone fractures - Contusions - Soft tissue injury - Concussion  Based on the initial assessment, the following workup was initiated CT scan of the brain.  I feel comfortable clearing the C-spine clinically.  I have independently interpreted the following  imaging from the perspective of acute trauma: CT scan of the brain and the results indicate no evidence of brain bleed.  I discussed the findings with the patient. On exam, patient has no focal neurodeficits that is new.  She has right lower extremity weakness, from her chronic stroke.  Patient indicates that she has had more falls recently.  Falls are because of leg giving out.  She feels that the right side is perhaps may be slightly weaker.  Last known normal is 7 days ago.  On exam, she still has 4+ out of 5 lower extremity strength.  I do not think she needs MRI brain emergently.  However, I have requested that the neck step be that patient get PT at the facility and get assessed by PCP.  Patient indicates that her facility does have  PT.  If there are higher concerns, she can get outpatient neuro follow-up.  She is already on blood thinners, and given her history of stroke, relatively optimized on medication from prevention perspective.  No indication for neurology consult at this time.  Final Clinical Impression(s) / ED Diagnoses Final diagnoses:  Hematoma of scalp, initial encounter  Recurrent falls  Elevated blood pressure reading    Rx / DC Orders ED Discharge Orders     None         Deatra Face, MD 11/30/23 1026

## 2023-11-27 ENCOUNTER — Telehealth: Payer: Self-pay | Admitting: Pharmacist

## 2023-11-27 DIAGNOSIS — E7800 Pure hypercholesterolemia, unspecified: Secondary | ICD-10-CM

## 2023-11-27 NOTE — Progress Notes (Signed)
   11/27/2023  Patient ID: Cassidy Hernandez, female   DOB: 09/02/1940, 83 y.o.   MRN: [id number]  Pharmacy Quality Measure Review  This patient is appearing on a report for being at risk of failing the adherence measure for cholesterol (statin) medications this calendar year.   Medication: Atorvastatin 80 mg  Last fill date: 10/26/2023 for 90 day supply  Reviewed Dr Anson Basta and Epic fill histories.  Patient was getting meds filled with [facility] via cycle packs earlier this year.  Atorvastatin 80 mg filled at [facility]  per Dr. Anson Basta on 10/26/23  Geronimo Krabbe, PharmD, [facility] Clinical Pharmacist [phone]

## 2023-11-28 DIAGNOSIS — N1831 Chronic kidney disease, stage 3a: Secondary | ICD-10-CM | POA: Diagnosis not present

## 2023-11-28 DIAGNOSIS — R278 Other lack of coordination: Secondary | ICD-10-CM | POA: Diagnosis not present

## 2023-11-28 DIAGNOSIS — R2689 Other abnormalities of gait and mobility: Secondary | ICD-10-CM | POA: Diagnosis not present

## 2023-11-28 DIAGNOSIS — R1310 Dysphagia, unspecified: Secondary | ICD-10-CM | POA: Diagnosis not present

## 2023-11-28 DIAGNOSIS — R479 Unspecified speech disturbances: Secondary | ICD-10-CM | POA: Diagnosis not present

## 2023-11-28 DIAGNOSIS — I5032 Chronic diastolic (congestive) heart failure: Secondary | ICD-10-CM | POA: Diagnosis not present

## 2023-11-28 DIAGNOSIS — I129 Hypertensive chronic kidney disease with stage 1 through stage 4 chronic kidney disease, or unspecified chronic kidney disease: Secondary | ICD-10-CM | POA: Diagnosis not present

## 2023-11-28 DIAGNOSIS — K219 Gastro-esophageal reflux disease without esophagitis: Secondary | ICD-10-CM | POA: Diagnosis not present

## 2023-11-28 DIAGNOSIS — R2681 Unsteadiness on feet: Secondary | ICD-10-CM | POA: Diagnosis not present

## 2023-11-28 DIAGNOSIS — M6281 Muscle weakness (generalized): Secondary | ICD-10-CM | POA: Diagnosis not present

## 2023-11-28 DIAGNOSIS — E1151 Type 2 diabetes mellitus with diabetic peripheral angiopathy without gangrene: Secondary | ICD-10-CM | POA: Diagnosis not present

## 2023-11-28 DIAGNOSIS — R4189 Other symptoms and signs involving cognitive functions and awareness: Secondary | ICD-10-CM | POA: Diagnosis not present

## 2023-11-28 DIAGNOSIS — R41841 Cognitive communication deficit: Secondary | ICD-10-CM | POA: Diagnosis not present

## 2023-11-28 DIAGNOSIS — G8191 Hemiplegia, unspecified affecting right dominant side: Secondary | ICD-10-CM | POA: Diagnosis not present

## 2023-11-28 DIAGNOSIS — I2510 Atherosclerotic heart disease of native coronary artery without angina pectoris: Secondary | ICD-10-CM | POA: Diagnosis not present

## 2023-11-28 DIAGNOSIS — E1122 Type 2 diabetes mellitus with diabetic chronic kidney disease: Secondary | ICD-10-CM | POA: Diagnosis not present

## 2023-11-28 DIAGNOSIS — I739 Peripheral vascular disease, unspecified: Secondary | ICD-10-CM | POA: Diagnosis not present

## 2023-11-28 DIAGNOSIS — R471 Dysarthria and anarthria: Secondary | ICD-10-CM | POA: Diagnosis not present

## 2023-11-28 DIAGNOSIS — Z741 Need for assistance with personal care: Secondary | ICD-10-CM | POA: Diagnosis not present

## 2023-11-28 DIAGNOSIS — Z9181 History of falling: Secondary | ICD-10-CM | POA: Diagnosis not present

## 2023-11-29 ENCOUNTER — Non-Acute Institutional Stay (SKILLED_NURSING_FACILITY): Payer: Self-pay | Admitting: Student

## 2023-11-29 ENCOUNTER — Encounter: Payer: Self-pay | Admitting: Student

## 2023-11-29 DIAGNOSIS — Z8673 Personal history of transient ischemic attack (TIA), and cerebral infarction without residual deficits: Secondary | ICD-10-CM

## 2023-11-29 DIAGNOSIS — G8191 Hemiplegia, unspecified affecting right dominant side: Secondary | ICD-10-CM | POA: Diagnosis not present

## 2023-11-29 DIAGNOSIS — I5032 Chronic diastolic (congestive) heart failure: Secondary | ICD-10-CM

## 2023-11-29 DIAGNOSIS — E1122 Type 2 diabetes mellitus with diabetic chronic kidney disease: Secondary | ICD-10-CM | POA: Diagnosis not present

## 2023-11-29 DIAGNOSIS — I129 Hypertensive chronic kidney disease with stage 1 through stage 4 chronic kidney disease, or unspecified chronic kidney disease: Secondary | ICD-10-CM

## 2023-11-29 DIAGNOSIS — W19XXXD Unspecified fall, subsequent encounter: Secondary | ICD-10-CM

## 2023-11-29 DIAGNOSIS — F0390 Unspecified dementia without behavioral disturbance: Secondary | ICD-10-CM

## 2023-11-29 DIAGNOSIS — I739 Peripheral vascular disease, unspecified: Secondary | ICD-10-CM

## 2023-11-29 DIAGNOSIS — I2510 Atherosclerotic heart disease of native coronary artery without angina pectoris: Secondary | ICD-10-CM

## 2023-11-29 DIAGNOSIS — R471 Dysarthria and anarthria: Secondary | ICD-10-CM | POA: Diagnosis not present

## 2023-11-29 DIAGNOSIS — N183 Chronic kidney disease, stage 3 unspecified: Secondary | ICD-10-CM

## 2023-11-29 DIAGNOSIS — I2584 Coronary atherosclerosis due to calcified coronary lesion: Secondary | ICD-10-CM

## 2023-11-29 DIAGNOSIS — K219 Gastro-esophageal reflux disease without esophagitis: Secondary | ICD-10-CM | POA: Diagnosis not present

## 2023-11-29 DIAGNOSIS — F321 Major depressive disorder, single episode, moderate: Secondary | ICD-10-CM

## 2023-11-29 NOTE — Progress Notes (Signed)
 Provider:  Dr. Valrie Gehrig Location:  Other [place].  Nursing Home Room Number: [facility] [address]A Place of Service:  SNF (31)  PCP: Valrie Gehrig, MD Patient Care Team: Valrie Gehrig, MD as PCP - General (Family Medicine) Valrie Gehrig, MD [facility] Medicine)  Extended Emergency Contact Information Primary Emergency Contact: [facility] Address: [place], [name] [address] [place] Home Phone: [phone] Relation: Daughter Secondary Emergency Contact: [name]: [phone] Relation: Sister  Code Status: Full Code.  Goals of Care: Advanced Directive information    11/26/2023    7:03 PM  Advanced Directives  Does Patient Have a Medical Advance Directive? Yes  Type of Advance Directive Healthcare Power of Attorney  Does patient want to make changes to medical advance directive? No - Patient declined  Copy of Healthcare Power of Attorney in Chart? No - copy available, Physician notified      Chief Complaint  Patient presents with   New Admit To SNF    Admission    HPI: Patient is a 83 y.o. female seen today for admission to [facility] History of Present Illness The patient presents with difficulty walking and multiple falls.  She has experienced difficulty walking, which has led to several falls. One fall resulted in a large hematoma on the back of her head, but no fractures were noted. She aims to improve her walking ability during her stay, with a time frame of thirty-five days for this improvement.  She also experiences reflux and has previously been on omeprazole for this condition. She needs medication to manage her reflux symptoms and has used Tums in the past for relief.  No fractures from her falls.  Past Medical History - History of falls - Diagnosed with reflux disease  Physical Exam EXTREMITIES: No bruising on the wrist. NEUROLOGICAL: Bruise and hematoma on the back of the  head.  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 b.) Doppler 12/30/2014: >70% LICA and 70% RICA. c.) Doppler 10/25/16: >70% Bilater ICAs   Chronic bilateral low back pain with left-sided sciatica    CKD (chronic kidney disease), stage III (HCC)    Coronary artery disease    a.) LHC 12/24/2014 -> small LAD system; diffuse CTO of LAD. 60% RCA. LM and LCx with no sig disease. no intervention; med mgmt.   Degenerative arthritis    Diastolic dysfunction    a.) TTE 09/20/2018: EF 55%, G1DD, mild LAE and RVE, triv-mild panvalvular regurgitation.   Dysarthria    GERD (gastroesophageal reflux disease)    Hepatic steatosis    History of kidney stones    HLD (hyperlipidemia)    Hypertension    Kidney stone    Lumbar radiculopathy    Major depression in remission (HCC)    PVD (peripheral vascular disease) (HCC)    Renal cyst, right 06/02/2015   a.) CT 06/02/2015 --> 4.4 cm RIGHT renal mass; MRI recommended. b.) MRI 06/16/2015 --> 3.8 x 4.2 x 4.0 cm proteinaceous/hemmorhagic cyst.   Subclavian steal syndrome    a.) LHC 12/24/2014 --> tight eccentric 90% ostial stenosis with damping.   T2DM (type 2 diabetes mellitus) (HCC)    Past Surgical History:  Procedure Laterality Date  ABDOMINAL HYSTERECTOMY     AUGMENTATION MAMMAPLASTY Bilateral 1980   BACK SURGERY  09/2018   BICEPT TENODESIS  06/10/2021   Procedure: BICEPS TENODESIS;  Surgeon: Elner Hahn, MD;  Location: [facility] ORS;  Service: Orthopedics;;   CARDIAC CATHETERIZATION Left 12/24/2014   Procedure: LEFT HEARD CATHETERIZATION; Location: [facility]; Surgeon: Dorean Gambles, MD   CHOLECYSTECTOMY     COLONOSCOPY N/A 02/11/2016   Procedure: COLONOSCOPY;  Surgeon: Cassie Click, MD;  Location: [facility] ENDOSCOPY;  Service:  Endoscopy;  Laterality: N/A;   IR CT HEAD LTD  11/16/2021   IR CT HEAD LTD  11/16/2021   IR INTRAVSC STENT CERV CAROTID W/O EMB-PROT MOD SED INC ANGIO  11/18/2021   IR PERCUTANEOUS ART THROMBECTOMY/INFUSION INTRACRANIAL INC DIAG ANGIO  11/16/2021   RADIOLOGY WITH ANESTHESIA N/A 11/16/2021   Procedure: IR WITH ANESTHESIA- CODE STROKE;  Surgeon: Radiologist, Medication, MD;  Location: [facility] OR;  Service: Radiology;  Laterality: N/A;   REVERSE SHOULDER ARTHROPLASTY Right 06/10/2021   Procedure: REVERSE SHOULDER ARTHROPLASTY;  Surgeon: Elner Hahn, MD;  Location: [facility] ORS;  Service: Orthopedics;  Laterality: Right;    reports that she has never smoked. She has never been exposed to tobacco smoke. She has never used smokeless tobacco. She reports that she does not drink alcohol and does not use drugs. Social History   Socioeconomic History   Marital status: Married    Spouse name: Not on file   Number of children: Not on file   Years of education: Not on file   Highest education level: Not on file  Occupational History   Not on file  Tobacco Use   Smoking status: Never    Passive exposure: Never   Smokeless tobacco: Never  Vaping Use   Vaping status: Never Used  Substance and Sexual Activity   Alcohol use: Never   Drug use: Never   Sexual activity: Not Currently  Other Topics Concern   Not on file  Social History Narrative   Not on file   Social Drivers of Health   Financial Resource Strain: Not on file  Food Insecurity: No Food Insecurity (03/14/2023)   Hunger Vital Sign    Worried About Running Out of Food in the Last Year: Never true    Ran Out of Food in the Last Year: Never true  Transportation Needs: No Transportation Needs (03/14/2023)   PRAPARE - Administrator, Civil Service (Medical): No    Lack of Transportation (Non-Medical): No  Physical Activity: Not on file  Stress: Not on file  Social Connections: Not on file  Intimate Partner Violence: Not At Risk  (03/14/2023)   Humiliation, Afraid, Rape, and Kick questionnaire    Fear of Current or Ex-Partner: No    Emotionally Abused: No    Physically Abused: No    Sexually Abused: No    Functional Status Survey:    Family History  Problem Relation Age of Onset   Heart attack Mother    Heart disease Mother    Heart attack Father    Heart disease Father    Breast cancer Neg Hx     Health Maintenance  Topic Date Due   Medicare Annual Wellness (AWV)  Never done   FOOT EXAM  Never done   OPHTHALMOLOGY EXAM  Never done   Diabetic kidney evaluation - Urine ACR  Never done   DEXA SCAN  Never done   COVID-19 Vaccine (10 - 2024-25 season) 06/30/2023   HEMOGLOBIN A1C  09/13/2023   INFLUENZA VACCINE  03/08/2024   Diabetic kidney evaluation - eGFR measurement  11/25/2024   DTaP/Tdap/Td (2 - Td or Tdap) 09/18/2033   Pneumonia Vaccine 39+ Years old  Completed   Zoster Vaccines- Shingrix  Completed   HPV VACCINES  Aged Out   Meningococcal B Vaccine  Aged Out    Allergies  Allergen Reactions   Sulfa Antibiotics Other (See Comments)    Unknown reaction    Outpatient Encounter Medications as of 11/29/2023  Medication Sig   aspirin  81 MG chewable tablet Chew 1 tablet (81 mg total) by mouth daily.   atorvastatin  (LIPITOR ) 80 MG tablet Take 1 tablet (80 mg total) by mouth daily.   carvedilol  (COREG ) 3.125 MG tablet Take 3.125 mg by mouth 2 (two) times daily.   chlorthalidone  (HYGROTON ) 25 MG tablet Take 1 tablet (25 mg total) by mouth daily.   cholecalciferol (VITAMIN D3) 25 MCG (1000 UNIT) tablet Take 1,000 Units by mouth daily.   clopidogrel  (PLAVIX ) 75 MG tablet Take 1 tablet (75 mg total) by mouth daily.   docusate sodium  (COLACE) 100 MG capsule Take 100 mg by mouth 2 (two) times daily.   escitalopram  (LEXAPRO ) 10 MG tablet Take 1 tablet (10 mg total) by mouth daily.   ezetimibe  (ZETIA ) 10 MG tablet Take 1 tablet (10 mg total) by mouth daily.   ferrous sulfate 325 (65 FE) MG EC tablet Take  325 mg by mouth daily with breakfast.   isosorbide  mononitrate (IMDUR ) 30 MG 24 hr tablet Take 30 mg by mouth daily.   losartan  (COZAAR ) 100 MG tablet Take 1 tablet (100 mg total) by mouth daily.   magnesium  chloride (SLOW-MAG) 64 MG TBEC SR tablet Take 1 tablet by mouth daily.   Multiple Vitamin (THEREMS PO) Take 1 tablet by mouth daily.   nitroGLYCERIN  (NITROSTAT ) 0.4 MG SL tablet Place 0.4 mg under the tongue every 5 (five) minutes as needed for chest pain.   pantoprazole  (PROTONIX ) 40 MG tablet Take 40 mg by mouth daily.   acetaminophen  (TYLENOL ) 325 MG tablet Take 650 mg by mouth every 4 (four) hours as needed (discomfort, elevated temperature). (Patient not taking: Reported on 11/29/2023)   alum & mag hydroxide-simeth (MAALOX/MYLANTA) 200-200-20 MG/5 SUSP Apply 30 mLs topically every 4 (four) hours as needed (gas, indigestion, upset stomach). (Patient not taking: Reported on 11/29/2023)   bismuth subsalicylate (KAOPECTATE) 262 MG/15ML suspension Take 10 mLs by mouth as needed for diarrhea or loose stools. (Patient not taking: Reported on 11/29/2023)   Carbamide Peroxide (DEBROX OT) Place 5 drops into both ears as needed (ear wax). (Patient not taking: Reported on 11/29/2023)   cetirizine (ZYRTEC) 5 MG tablet Take 5 mg by mouth at bedtime as needed for allergies. (Patient not taking: Reported on 11/29/2023)   dextromethorphan-guaiFENesin (TUSSIN DM) 10-100 MG/5ML liquid Take 10 mLs by mouth every 4 (four) hours as needed for cough. (Patient not taking: Reported on 11/29/2023)   Glucose 15 GM/32ML GEL Take 1 packet by mouth as needed (low blood sugar). (Patient not taking: Reported on 11/29/2023)   magnesium  hydroxide (MILK OF MAGNESIA) 400 MG/5ML suspension Take 30 mLs by mouth as needed (constipation). (Patient not taking: Reported on 11/29/2023)   nystatin (MYCOSTATIN/NYSTOP) powder Apply 1 Application topically 2 (two) times daily as needed (skin breakdown). (Patient not taking: Reported on 11/29/2023)    ondansetron  (ZOFRAN ) 4 MG tablet Take 4 mg by mouth 3 (three) times daily as needed for nausea. (Patient not taking: Reported on 11/29/2023)  OXYGEN Inhale into the lungs. 2lpm for dyspnea or SOB (Patient not taking: Reported on 11/29/2023)   Vitamin D , Ergocalciferol , (DRISDOL ) 1.25 MG (50000 UNIT) CAPS capsule Take 1 capsule (50,000 Units total) by mouth every 7 (seven) days. (Patient not taking: Reported on 11/29/2023)   [DISCONTINUED] isosorbide  mononitrate (IMDUR ) 30 MG 24 hr tablet Take 1 tablet (30 mg total) by mouth daily.   No facility-administered encounter medications on file as of 11/29/2023.    Review of Systems  Vitals:   11/29/23 0819 11/29/23 0828  BP: (!) 170/73 (!) 143/77  Pulse: 86   Resp: 18   Temp: (!) 97.1 F (36.2 C)   SpO2: 91%   Weight: 156 lb 9.6 oz (71 kg)   Height: 5\' 2"  (1.575 m)    Body mass index is 28.64 kg/m. Physical Exam Cardiovascular:     Rate and Rhythm: Normal rate.     Pulses: Normal pulses.  Pulmonary:     Effort: Pulmonary effort is normal.  Neurological:     Mental Status: She is alert.     Comments: dysarthric     Labs reviewed: Basic Metabolic Panel: Recent Labs    12/19/22 2251 12/20/22 0450 03/13/23 1029 03/13/23 1034 11/23/23 0837 11/26/23 1906  NA  --    < > 139 140 136 134*  K  --    < > 3.6 3.5 3.4* 3.5  CL  --    < > 102 104 100 95*  CO2  --    < > 26  --  27 27  GLUCOSE  --    < > 108* 103* 121* 138*  BUN  --    < > 15 16 23  26*  CREATININE  --    < > 1.07* 1.10* 1.20* 1.22*  CALCIUM   --    < > 9.1  --  9.2 9.8  MG 1.4*  --   --   --   --   --    < > = values in this interval not displayed.   Liver Function Tests: Recent Labs    12/20/22 0450 03/13/23 1029 11/23/23 0837  AST 17 29 25   ALT 15 26 29   ALKPHOS 49 51 50  BILITOT 0.8 0.7 0.7  PROT 6.1* 6.9 6.8  ALBUMIN 3.3* 3.6 3.7   No results for input(s): "LIPASE", "AMYLASE" in the last 8760 hours. No results for input(s): "AMMONIA" in the last  8760 hours. CBC: Recent Labs    03/13/23 1029 03/13/23 1034 11/23/23 0837 11/26/23 1906  WBC 7.2  --  9.4 10.8*  NEUTROABS 4.6  --  6.2 7.1  HGB 13.4 13.9 13.1 13.6  HCT 41.2 41.0 39.1 39.5  MCV 94.7  --  97.3 95.2  PLT 256  --  274 292   Cardiac Enzymes: Recent Labs    12/19/22 1512  CKTOTAL 150   BNP: Invalid input(s): "POCBNP" Lab Results  Component Value Date   HGBA1C 6.4 (H) 03/13/2023   No results found for: "TSH" Lab Results  Component Value Date   VITAMINB12 748 03/02/2023   No results found for: "FOLATE" Lab Results  Component Value Date   IRON 72 03/02/2023   TIBC 306 03/02/2023   FERRITIN 29 03/02/2023    Imaging and Procedures obtained prior to SNF admission: CT HEAD WO CONTRAST Result Date: 11/26/2023 CLINICAL DATA:  Head trauma, moderate-to-severe. EXAM: CT HEAD WITHOUT CONTRAST TECHNIQUE: Contiguous axial images were obtained from the base of the skull through the  vertex without intravenous contrast. RADIATION DOSE REDUCTION: This exam was performed according to the departmental dose-optimization program which includes automated exposure control, adjustment of the mA and/or kV according to patient size and/or use of iterative reconstruction technique. COMPARISON:  09/19/2023. FINDINGS: Brain: There is no acute intracranial hemorrhage, midline shift or mass effect. No extra-axial fluid collection is seen. Examination is limited due to artifact. Diffuse atrophy is noted. Periventricular white matter hypodensities are present bilaterally. No hydrocephalus is seen. 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 Vascular: No hyperdense vessel or unexpected calcification. Skull: Normal. Negative for fracture or focal lesion. Sinuses/Orbits: No acute finding. Other: A large scalp hematoma is present over the parietal bone on the right. IMPRESSION: 1. No acute intracranial process. 2. Atrophy, chronic microvascular ischemic changes and old infarcts on  the left. Electronically Signed   By: Wyvonnia Heimlich M.D.   On: 11/26/2023 20:14   DG Chest Port 1 View Result Date: 11/26/2023 CLINICAL DATA:  Trauma, fall. EXAM: PORTABLE CHEST 1 VIEW COMPARISON:  11/23/2023. FINDINGS: The heart mildly enlarged and the mediastinal contour stable. There is atherosclerotic calcification of the aorta. Subsegmental atelectasis or scarring is noted in the mid left lung. No consolidation, effusion, or pneumothorax. Shoulder arthroplasty changes are present on the right. No acute fracture is seen. Bilateral breast implants are noted. IMPRESSION: Stable chest with no acute process. Electronically Signed   By: Wyvonnia Heimlich M.D.   On: 11/26/2023 20:08    Assessment/Plan Chronic diastolic (congestive) heart failure (HCC), Chronic  Major neurocognitive disorder (HCC), Chronic  Right hemiparesis (HCC), Chronic  Major depressive disorder, single episode, moderate (HCC), Chronic  Coronary artery disease due to calcified coronary lesion  Peripheral vascular disease (HCC)  Gastroesophageal reflux disease without esophagitis  Controlled type 2 diabetes mellitus with stage 3 chronic kidney disease, without long-term current use of insulin  (HCC)  Benign hypertension with CKD (chronic kidney disease) stage III (HCC)  Fall, subsequent encounter  Dysarthria  History of stroke Gastroesophageal reflux disease (GERD) Recurrent symptoms of GERD. Previously on omeprazole (Prilosec). - Restart omeprazole (Prilosec) with the understanding that it may take up to a month to be effective. - Recommend Tums as needed for immediate relief.  Hematoma of the head Hematoma on the back of the head following a fall. No fractures identified.   Hx of strokes with numerous falls Patient was previously in assisted living, and has had numerous falls. Due to impaired mobility, she has difficulty managing self care in AL. Patient has moved to higher level of care for a trial of 45 days. If  she does not have significant improvement, she will stay in the facility for long term care.   Family/ staff Communication: Nursing  Labs/tests ordered: CBC, BMP

## 2023-11-30 ENCOUNTER — Encounter: Payer: Self-pay | Admitting: Student

## 2023-11-30 DIAGNOSIS — Z79899 Other long term (current) drug therapy: Secondary | ICD-10-CM | POA: Diagnosis not present

## 2023-11-30 DIAGNOSIS — F0390 Unspecified dementia without behavioral disturbance: Secondary | ICD-10-CM | POA: Insufficient documentation

## 2023-11-30 DIAGNOSIS — I5032 Chronic diastolic (congestive) heart failure: Secondary | ICD-10-CM | POA: Insufficient documentation

## 2023-11-30 DIAGNOSIS — F0153 Vascular dementia, unspecified severity, with mood disturbance: Secondary | ICD-10-CM | POA: Insufficient documentation

## 2023-11-30 LAB — BASIC METABOLIC PANEL WITH GFR
BUN: 25 — AB (ref 4–21)
CO2: 27 — AB (ref 13–22)
Chloride: 99 (ref 99–108)
Creatinine: 1 (ref 0.5–1.1)
Glucose: 104
Potassium: 3.8 meq/L (ref 3.5–5.1)
Sodium: 136 — AB (ref 137–147)

## 2023-11-30 LAB — CBC AND DIFFERENTIAL
HCT: 35 — AB (ref 36–46)
Hemoglobin: 11.8 — AB (ref 12.0–16.0)
Neutrophils Absolute: 6211
Platelets: 262 10*3/uL (ref 150–400)
WBC: 10.3

## 2023-11-30 LAB — COMPREHENSIVE METABOLIC PANEL WITH GFR
Calcium: 9.5 (ref 8.7–10.7)
eGFR: 55

## 2023-11-30 LAB — CBC: RBC: 3.66 — AB (ref 3.87–5.11)

## 2023-12-02 ENCOUNTER — Encounter: Payer: Self-pay | Admitting: Student

## 2023-12-05 ENCOUNTER — Encounter: Payer: Self-pay | Admitting: Nurse Practitioner

## 2023-12-07 ENCOUNTER — Non-Acute Institutional Stay (SKILLED_NURSING_FACILITY): Payer: Self-pay | Admitting: Nurse Practitioner

## 2023-12-07 ENCOUNTER — Encounter: Payer: Self-pay | Admitting: Nurse Practitioner

## 2023-12-07 DIAGNOSIS — M545 Low back pain, unspecified: Secondary | ICD-10-CM | POA: Diagnosis not present

## 2023-12-07 DIAGNOSIS — M79604 Pain in right leg: Secondary | ICD-10-CM | POA: Diagnosis not present

## 2023-12-07 DIAGNOSIS — M25551 Pain in right hip: Secondary | ICD-10-CM | POA: Diagnosis not present

## 2023-12-07 MED ORDER — TIZANIDINE HCL 2 MG PO CAPS: 2.0000 mg | ORAL_CAPSULE | Freq: Three times a day (TID) | ORAL | Status: AC

## 2023-12-07 MED ORDER — TRAMADOL HCL 50 MG PO TABS: 50.0000 mg | ORAL_TABLET | Freq: Three times a day (TID) | ORAL | 0 refills | Status: AC | PRN

## 2023-12-07 NOTE — Progress Notes (Signed)
 Location:  Other Nursing Home Room Number: [facility] [address] Place of Service:  SNF ([phone])  Cassidy Hernandez, [place], MD  Patient Care Team: Cassidy Gehrig, MD as PCP - General (Family Medicine) Cassidy Gehrig, MD [facility])  Extended Emergency Contact Information Primary Emergency Contact: Cassidy Hernandez Address: [place], [name] [address] [place] Home Phone: [phone] Relation: Daughter Secondary Emergency Contact: Cassidy Hernandez Mobile Phone: [phone] Relation: Sister  Goals of care: Advanced Directive information    11/26/2023    7:03 PM  Advanced Directives  Does Patient Have a Medical Advance Directive? Yes  Type of Advance Directive Healthcare Power of Attorney  Does patient want to make changes to medical advance directive? No - Patient declined  Copy of Healthcare Power of Attorney in Chart? No - copy available, Physician notified     Chief Complaint  Patient presents with   Leg Pain    Right Leg Pain.     HPI:  Pt is a 83 y.o. female seen today for an acute visit for leg pain.  She has hx of CVA with right sided weakness an multiple falls.  She is at [facility] after multiple falls ~ 2 weeks ago, she went to ED on 3/17 and 3/20 and was transfer to SNF after this.   She has been experiencing severe pain in her right leg, which began two days ago after a physical therapy session. The pain is described as throbbing and aching, rated as a 12 out of 10 on the pain scale. There was no specific incident such as a pop or pull or injury during therapy that could have triggered the pain and she was able to complete PT.   The pain is primarily located in the back of the right thigh and does not radiate. It is constant and worsens with movement, particularly when attempting to bear weight on the leg or move it in certain positions. She requires assistance to get into bed and cannot use her walker independently due to the pain.  She has  been taking Tylenol , but it does not alleviate the pain.   no swelling or redness.   Discussed the use of AI scribe software for clinical note transcription with the patient, who gave verbal consent to proceed.   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 b.) Doppler 12/30/2014: >70% LICA and 70% RICA. c.) Doppler 10/25/16: >70% Bilater ICAs   Chronic bilateral low back pain with left-sided sciatica    CKD (chronic kidney disease), stage III (HCC)    Coronary artery disease    a.) LHC 12/24/2014 -> small LAD Hernandez; diffuse CTO of LAD. 60% RCA. LM and LCx with no sig disease. no intervention; med mgmt.   Degenerative arthritis    Diastolic dysfunction    a.) TTE 09/20/2018: EF 55%, G1DD, mild LAE and RVE, triv-mild panvalvular regurgitation.   Dysarthria    GERD (gastroesophageal reflux disease)    Hepatic steatosis    History of kidney stones    HLD (hyperlipidemia)    Hypertension    Kidney stone    Lumbar radiculopathy    Major depression in remission (HCC)    PVD (peripheral vascular disease) (HCC)    Renal cyst, right 06/02/2015   a.) CT 06/02/2015 --> 4.4 cm  RIGHT renal mass; MRI recommended. b.) MRI 06/16/2015 --> 3.8 x 4.2 x 4.0 cm proteinaceous/hemmorhagic cyst.   Subclavian steal syndrome    a.) LHC 12/24/2014 --> tight eccentric 90% ostial stenosis with damping.   T2DM (type 2 diabetes mellitus) [facility])    Past Surgical History:  Procedure Laterality Date   ABDOMINAL HYSTERECTOMY     AUGMENTATION MAMMAPLASTY Bilateral 1980   BACK SURGERY  09/2018   BICEPT TENODESIS  06/10/2021   Procedure: BICEPS TENODESIS;  Surgeon: Elner Hahn, MD;  Location: [facility] ORS;  Service: Orthopedics;;   CARDIAC CATHETERIZATION Left 12/24/2014   Procedure: LEFT HEARD CATHETERIZATION; Location: [facility]; Surgeon: Dorean Gambles, MD   CHOLECYSTECTOMY      COLONOSCOPY N/A 02/11/2016   Procedure: COLONOSCOPY;  Surgeon: Cassie Click, MD;  Location: [facility] ENDOSCOPY;  Service: Endoscopy;  Laterality: N/A;   IR CT HEAD LTD  11/16/2021   IR CT HEAD LTD  11/16/2021   IR INTRAVSC STENT CERV CAROTID W/O EMB-PROT MOD SED INC ANGIO  11/18/2021   IR PERCUTANEOUS ART THROMBECTOMY/INFUSION INTRACRANIAL INC DIAG ANGIO  11/16/2021   RADIOLOGY WITH ANESTHESIA N/A 11/16/2021   Procedure: IR WITH ANESTHESIA- CODE STROKE;  Surgeon: Radiologist, Medication, MD;  Location: [facility] OR;  Service: Radiology;  Laterality: N/A;   REVERSE SHOULDER ARTHROPLASTY Right 06/10/2021   Procedure: REVERSE SHOULDER ARTHROPLASTY;  Surgeon: Elner Hahn, MD;  Location: [facility] ORS;  Service: Orthopedics;  Laterality: Right;    Allergies  Allergen Reactions   Sulfa Antibiotics Other (See Comments)    Unknown reaction    Outpatient Encounter Medications as of 12/07/2023  Medication Sig   acetaminophen  (TYLENOL ) 325 MG tablet Take 650 mg by mouth every 4 (four) hours as needed (discomfort, elevated temperature).   alum & mag hydroxide-simeth (MAALOX/MYLANTA) 200-200-20 MG/5 SUSP Apply 30 mLs topically every 4 (four) hours as needed (gas, indigestion, upset stomach).   aspirin  81 MG chewable tablet Chew 1 tablet (81 mg total) by mouth daily.   atorvastatin  (LIPITOR ) 80 MG tablet Take 1 tablet (80 mg total) by mouth daily.   bismuth subsalicylate (KAOPECTATE) 262 MG/15ML suspension Take 10 mLs by mouth as needed for diarrhea or loose stools.   Carbamide Peroxide (DEBROX OT) Place 5 drops into both ears as needed (ear wax).   carvedilol  (COREG ) 3.125 MG tablet Take 3.125 mg by mouth 2 (two) times daily.   cetirizine (ZYRTEC) 5 MG tablet Take 5 mg by mouth at bedtime as needed for allergies.   chlorthalidone  (HYGROTON ) 25 MG tablet Take 1 tablet (25 mg total) by mouth daily.   cholecalciferol (VITAMIN D3) 25 MCG (1000 UNIT) tablet Take 1,000 Units by mouth daily.   clopidogrel  (PLAVIX ) 75 MG  tablet Take 1 tablet (75 mg total) by mouth daily.   dextromethorphan-guaiFENesin (TUSSIN DM) 10-100 MG/5ML liquid Take 10 mLs by mouth every 4 (four) hours as needed for cough.   docusate sodium  (COLACE) 100 MG capsule Take 100 mg by mouth 2 (two) times daily.   escitalopram  (LEXAPRO ) 10 MG tablet Take 1 tablet (10 mg total) by mouth daily.   ezetimibe  (ZETIA ) 10 MG tablet Take 1 tablet (10 mg total) by mouth daily.   Fe Fum-Vit C-Vit B12-FA (TRIGELS-F FORTE) CAPS capsule Take 1 capsule by mouth 2 (two) times daily.   ferrous sulfate 325 (65 FE) MG EC tablet Take 325 mg by mouth daily with breakfast.   Glucose 15 GM/32ML GEL Take 1 packet by mouth as needed (low blood sugar).   isosorbide   mononitrate (IMDUR ) 30 MG 24 hr tablet Take 30 mg by mouth daily.   losartan  (COZAAR ) 100 MG tablet Take 1 tablet (100 mg total) by mouth daily.   magnesium  chloride (SLOW-MAG) 64 MG TBEC SR tablet Take 1 tablet by mouth daily.   magnesium  hydroxide (MILK OF MAGNESIA) 400 MG/5ML suspension Take 30 mLs by mouth as needed (constipation).   Multiple Vitamin (THEREMS PO) Take 1 tablet by mouth daily.   nitroGLYCERIN  (NITROSTAT ) 0.4 MG SL tablet Place 0.4 mg under the tongue every 5 (five) minutes as needed for chest pain.   nystatin (MYCOSTATIN/NYSTOP) powder Apply 1 Application topically 2 (two) times daily as needed (skin breakdown).   ondansetron  (ZOFRAN ) 4 MG tablet Take 4 mg by mouth 3 (three) times daily as needed for nausea.   pantoprazole  (PROTONIX ) 40 MG tablet Take 40 mg by mouth daily.   ticagrelor  (BRILINTA ) 90 MG TABS tablet Take 90 mg by mouth 2 (two) times daily.   tizanidine  (ZANAFLEX ) 2 MG capsule Take 1 capsule (2 mg total) by mouth 3 (three) times daily.   traMADol  (ULTRAM ) 50 MG tablet Take 1 tablet (50 mg total) by mouth every 8 (eight) hours as needed for up to 5 days.   Vitamin D , Ergocalciferol , (DRISDOL ) 1.25 MG (50000 UNIT) CAPS capsule Take 1 capsule (50,000 Units total) by mouth every 7  (seven) days.   OXYGEN Inhale into the lungs. 2lpm for dyspnea or SOB (Patient not taking: Reported on 12/07/2023)   No facility-administered encounter medications on file as of 12/07/2023.    Review of Systems  Constitutional:  Positive for activity change.  Musculoskeletal:  Positive for arthralgias, gait problem and myalgias.  Skin:  Negative for wound.    Immunization History  Administered Date(s) Administered   Influenza Inj Mdck Quad Pf 05/02/2018   Influenza Split 05/26/2015   Influenza, High Dose Seasonal PF 05/16/2019, 05/10/2020, 05/25/2021   Influenza-Unspecified 05/01/2012, 05/12/2013, 06/08/2016, 04/28/2017, 05/26/2023   Moderna Covid-19 Vaccine Bivalent Booster 72yrs & up 05/25/2021   PFIZER Comirnaty(Gray Top)Covid-19 Tri-Sucrose Vaccine 08/15/2019, 09/07/2019, 05/10/2020   PFIZER(Purple Top)SARS-COV-2 Vaccination 08/15/2019, 09/07/2019, 05/02/2020, 11/25/2020, 05/05/2023   Pneumococcal Conjugate-13 05/26/2015   Pneumococcal Polysaccharide-23 08/09/2011   Tdap 09/19/2023   Zoster Recombinant(Shingrix) 08/30/2018, 01/03/2019   Zoster, Live 08/09/2011   Pertinent  Health Maintenance Due  Topic Date Due   FOOT EXAM  Never done   OPHTHALMOLOGY EXAM  Never done   DEXA SCAN  Never done   HEMOGLOBIN A1C  09/13/2023   INFLUENZA VACCINE  03/08/2024      11/23/2021   11:00 AM 11/23/2021    8:00 PM 11/24/2021    9:00 AM 11/24/2021    8:10 PM 11/25/2021    8:00 AM  Fall Risk  (RETIRED) Patient Fall Risk Level Moderate fall risk Moderate fall risk Moderate fall risk Moderate fall risk Moderate fall risk   Functional Status Survey:    Vitals:   12/07/23 1509 12/07/23 1522  BP: (!) 197/85 114/78  Pulse: 87   Resp: 14   Temp: 97.6 F (36.4 C)   SpO2: 94%   Weight: 156 lb (70.8 kg)   Height: 5\' 2"  (1.575 m)    Body mass index is 28.53 kg/m. Physical Exam Constitutional:      Appearance: Normal appearance.  Pulmonary:     Effort: Pulmonary effort is normal.   Musculoskeletal:     Lumbar back: Normal. No tenderness.     Right hip: Decreased range of motion.  Left hip: Decreased range of motion.     Right upper leg: Tenderness (with tightness to upper thighs) present. No swelling or edema.     Left upper leg: No swelling, edema or tenderness.  Neurological:     Mental Status: She is alert. Mental status is at baseline.  Psychiatric:        Mood and Affect: Mood normal.     Labs reviewed: Recent Labs    12/19/22 2251 12/20/22 0450 03/13/23 1034 11/23/23 0837 11/26/23 1906 11/30/23 0000  NA  --    < > 140 136 134* 136*  K  --    < > 3.5 3.4* 3.5 3.8  CL  --    < > 104 100 95* 99  CO2  --    < >  --  27 27 27*  GLUCOSE  --    < > 103* 121* 138*  --   BUN  --    < > 16 23 26* 25*  CREATININE  --    < > 1.10* 1.20* 1.22* 1.0  CALCIUM   --    < >  --  9.2 9.8 9.5  MG 1.4*  --   --   --   --   --    < > = values in this interval not displayed.   Recent Labs    12/20/22 0450 03/13/23 1029 11/23/23 0837  AST 17 29 25   ALT 15 26 29   ALKPHOS 49 51 50  BILITOT 0.8 0.7 0.7  PROT 6.1* 6.9 6.8  ALBUMIN 3.3* 3.6 3.7   Recent Labs    03/13/23 1029 03/13/23 1034 11/23/23 0837 11/26/23 1906 11/30/23 0000  WBC 7.2  --  9.4 10.8* 10.3  NEUTROABS 4.6  --  6.2 7.1 6,211.00  HGB 13.4   < > 13.1 13.6 11.8*  HCT 41.2   < > 39.1 39.5 35*  MCV 94.7  --  97.3 95.2  --   PLT 256  --  274 292 262   < > = values in this interval not displayed.   No results found for: "TSH" Lab Results  Component Value Date   HGBA1C 6.4 (H) 03/13/2023   Lab Results  Component Value Date   CHOL 112 03/14/2023   HDL 48 03/14/2023   LDLCALC 41 03/14/2023   TRIG 117 03/14/2023   CHOLHDL 2.3 03/14/2023    Significant Diagnostic Results in last 30 days:  CT HEAD WO CONTRAST Result Date: 11/26/2023 CLINICAL DATA:  Head trauma, moderate-to-severe. EXAM: CT HEAD WITHOUT CONTRAST TECHNIQUE: Contiguous axial images were obtained from the base of the  skull through the vertex without intravenous contrast. RADIATION DOSE REDUCTION: This exam was performed according to the departmental dose-optimization program which includes automated exposure control, adjustment of the mA and/or kV according to patient size and/or use of iterative reconstruction technique. COMPARISON:  09/19/2023. FINDINGS: Brain: There is no acute intracranial hemorrhage, midline shift or mass effect. No extra-axial fluid collection is seen. Examination is limited due to artifact. Diffuse atrophy is noted. Periventricular white matter hypodensities are present bilaterally. No hydrocephalus is seen. 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 Vascular: No hyperdense vessel or unexpected calcification. Skull: Normal. Negative for fracture or focal lesion. Sinuses/Orbits: No acute finding. Other: A large scalp hematoma is present over the parietal bone on the right. IMPRESSION: 1. No acute intracranial process. 2. 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 Electronically Signed  By: Wyvonnia Heimlich M.D.   On: 11/26/2023 20:14   DG Chest Port 1 View Result Date: 11/26/2023 CLINICAL DATA:  Trauma, fall. EXAM: PORTABLE CHEST 1 VIEW COMPARISON:  11/23/2023. FINDINGS: The heart mildly enlarged and the mediastinal contour stable. There is atherosclerotic calcification of the aorta. Subsegmental atelectasis or scarring is noted in the mid left lung. No consolidation, effusion, or pneumothorax. Shoulder arthroplasty changes are present on the right. No acute fracture is seen. Bilateral breast implants are noted. IMPRESSION: Stable chest with no acute process. Electronically Signed   By: Wyvonnia Heimlich M.D.   On: 11/26/2023 20:08   DG Chest Port 1 View Result Date: 11/23/2023 CLINICAL DATA:  Chest pain EXAM: PORTABLE CHEST 1 VIEW COMPARISON:  03/13/2023 FINDINGS: The lungs are clear without focal pneumonia, edema, pneumothorax or pleural effusion.  Streaky densities at the left base are compatible with atelectasis or scarring. The cardiopericardial silhouette is within normal limits for size. No acute bony abnormality. Telemetry leads overlie the chest. IMPRESSION: Subtle left base atelectasis or scarring. Otherwise no acute findings. Electronically Signed   By: Donnal Fusi M.D.   On: 11/23/2023 09:34    Assessment/Plan 1. Right leg pain (Primary) Severe pain to right leg which is the leg effected by CVA Decrease in ROM to right hip as well with tightness noted to thigh after PT.  ?strain from therapy vs spasm  But will rule out fracture or deferred pain due to frequent falls with xray of hip/pelvis and low back.  -schedule Zanaflex  2 mg q 8 hours for 3 days then PRN.  - traMADol  (ULTRAM ) 50 MG tablet; Take 1 tablet (50 mg total) by mouth every 8 (eight) hours as needed for up to 5 days.  Dispense: 15 tablet; Refill: 0   Cassidy Hernandez K. Denney Fisherman [facility] [phone]

## 2023-12-11 ENCOUNTER — Encounter: Payer: Self-pay | Admitting: Student

## 2023-12-11 ENCOUNTER — Non-Acute Institutional Stay (SKILLED_NURSING_FACILITY): Payer: Self-pay | Admitting: Student

## 2023-12-11 DIAGNOSIS — M47817 Spondylosis without myelopathy or radiculopathy, lumbosacral region: Secondary | ICD-10-CM

## 2023-12-11 DIAGNOSIS — M1611 Unilateral primary osteoarthritis, right hip: Secondary | ICD-10-CM

## 2023-12-11 NOTE — Progress Notes (Unsigned)
 Location:   [facility] Room Number: 105-A Place of Service:  SNF [phone]) Provider:  Dr.Raylyn Carton Lalla Pill, MD  Patient Care Team: Valrie Gehrig, MD as PCP - General (Family Medicine) Valrie Gehrig, MD [facility])  Extended Emergency Contact Information Primary Emergency Contact: [facility] Address: [place], [name] [address] [place] Home Phone: [phone] Relation: Daughter Secondary Emergency Contact: [name]: [phone] Relation: Sister  Code Status:  FULL CODE Goals of care: Advanced Directive information    12/11/2023   11:37 AM  Advanced Directives  Does Patient Have a Medical Advance Directive? Yes  Type of Estate agent of [place];Living will  Does patient want to make changes to medical advance directive? No - Patient declined  Copy of Healthcare Power of Attorney in Chart? Yes - validated most recent copy scanned in chart (See row information)     Chief Complaint  Patient presents with   Leg Pain    HPI:  Pt is a 83 y.o. female seen today for an acute visit for follow up of leg pain. Patient has had right hip pain which intensified in the last week. She states the only thing that has helped is pain medicine- recent start of tramadol .   X-ray shows severe arthritis. Discussed likely contributing to pain and sometimes surgery is recommended, however, she states she would not want any surgery.   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 b.) Doppler 12/30/2014: >70% LICA and 70% RICA. c.) Doppler 10/25/16: >70% Bilater ICAs   Chronic bilateral low back pain with left-sided sciatica    CKD (chronic kidney disease), stage III (HCC)    Coronary artery disease    a.) LHC 12/24/2014 -> small LAD system;  diffuse CTO of LAD. 60% RCA. LM and LCx with no sig disease. no intervention; med mgmt.   Degenerative arthritis    Diastolic dysfunction    a.) TTE 09/20/2018: EF 55%, G1DD, mild LAE and RVE, triv-mild panvalvular regurgitation.   Dysarthria    GERD (gastroesophageal reflux disease)    Hepatic steatosis    History of kidney stones    HLD (hyperlipidemia)    Hypertension    Kidney stone    Lumbar radiculopathy    Major depression in remission (HCC)    PVD (peripheral vascular disease) (HCC)    Renal cyst, right 06/02/2015   a.) CT 06/02/2015 --> 4.4 cm RIGHT renal mass; MRI recommended. b.) MRI 06/16/2015 --> 3.8 x 4.2 x 4.0 cm proteinaceous/hemmorhagic cyst.   Subclavian steal syndrome    a.) LHC 12/24/2014 --> tight eccentric 90% ostial stenosis with damping.   T2DM (type 2 diabetes mellitus) [facility])    Past Surgical History:  Procedure Laterality Date   ABDOMINAL HYSTERECTOMY     AUGMENTATION MAMMAPLASTY Bilateral 1980   BACK SURGERY  09/2018   BICEPT TENODESIS  06/10/2021   Procedure: BICEPS TENODESIS;  Surgeon: Elner Hahn, MD;  Location: [facility] ORS;  Service: Orthopedics;;   CARDIAC CATHETERIZATION Left 12/24/2014   Procedure: LEFT HEARD CATHETERIZATION; Location: [facility]; Surgeon: Dorean Gambles, MD   CHOLECYSTECTOMY     COLONOSCOPY N/A 02/11/2016   Procedure: COLONOSCOPY;  Surgeon: Cassie Click, MD;  Location: [facility] ENDOSCOPY;  Service: Endoscopy;  Laterality: N/A;  IR CT HEAD LTD  11/16/2021   IR CT HEAD LTD  11/16/2021   IR INTRAVSC STENT CERV CAROTID W/O EMB-PROT MOD SED INC ANGIO  11/18/2021   IR PERCUTANEOUS ART THROMBECTOMY/INFUSION INTRACRANIAL INC DIAG ANGIO  11/16/2021   RADIOLOGY WITH ANESTHESIA N/A 11/16/2021   Procedure: IR WITH ANESTHESIA- CODE STROKE;  Surgeon: Radiologist, Medication, MD;  Location: [facility] OR;  Service: Radiology;  Laterality: N/A;   REVERSE SHOULDER ARTHROPLASTY Right 06/10/2021   Procedure: REVERSE SHOULDER ARTHROPLASTY;  Surgeon: Elner Hahn, MD;   Location: [facility] ORS;  Service: Orthopedics;  Laterality: Right;    Allergies  Allergen Reactions   Sulfa Antibiotics Other (See Comments)    Unknown reaction    Allergies as of 12/11/2023       Reactions   Sulfa Antibiotics Other (See Comments)   Unknown reaction        Medication List        Accurate as of Dec 11, 2023 11:38 AM. If you have any questions, ask your nurse or doctor.          acetaminophen  325 MG tablet Commonly known as: TYLENOL  Take 650 mg by mouth every 4 (four) hours as needed (discomfort, elevated temperature).   alum & mag hydroxide-simeth 200-200-20 MG/5 Susp Commonly known as: MAALOX/MYLANTA Apply 30 mLs topically every 4 (four) hours as needed (gas, indigestion, upset stomach).   aspirin  81 MG chewable tablet Chew 1 tablet (81 mg total) by mouth daily.   atorvastatin  80 MG tablet Commonly known as: LIPITOR  Take 1 tablet (80 mg total) by mouth daily.   carvedilol  3.125 MG tablet Commonly known as: COREG  Take 3.125 mg by mouth 2 (two) times daily.   cetirizine 5 MG tablet Commonly known as: ZYRTEC Take 5 mg by mouth as needed for allergies.   chlorthalidone  25 MG tablet Commonly known as: HYGROTON  Take 1 tablet (25 mg total) by mouth daily.   cholecalciferol 25 MCG (1000 UNIT) tablet Commonly known as: VITAMIN D3 Take 1,000 Units by mouth daily.   clopidogrel  75 MG tablet Commonly known as: PLAVIX  Take 1 tablet (75 mg total) by mouth daily.   DEBROX OT Place 5 drops into both ears as needed (ear wax).   docusate sodium  100 MG capsule Commonly known as: COLACE Take 100 mg by mouth 2 (two) times daily.   escitalopram  10 MG tablet Commonly known as: LEXAPRO  Take 1 tablet (10 mg total) by mouth daily.   ezetimibe  10 MG tablet Commonly known as: ZETIA  Take 1 tablet (10 mg total) by mouth daily.   ferrous sulfate 325 (65 FE) MG EC tablet Take 325 mg by mouth daily with breakfast.   Glucose 15 GM/32ML Gel Take 1 packet by  mouth as needed (low blood sugar).   isosorbide  mononitrate 30 MG 24 hr tablet Commonly known as: IMDUR  Take 30 mg by mouth daily.   Kaopectate 262 MG/15ML suspension Generic drug: bismuth subsalicylate Take 10 mLs by mouth as needed for diarrhea or loose stools.   losartan  100 MG tablet Commonly known as: COZAAR  Take 1 tablet (100 mg total) by mouth daily.   magnesium  chloride 64 MG Tbec SR tablet Commonly known as: SLOW-MAG Take 1 tablet by mouth daily.   Milk of Magnesia 7.75 % suspension Generic drug: magnesium  hydroxide Take 30 mLs by mouth as needed (constipation).   nitroGLYCERIN  0.4 MG SL tablet Commonly known as: NITROSTAT  Place 0.4 mg under the tongue every 5 (five) minutes as needed for chest pain.  nystatin powder Commonly known as: MYCOSTATIN/NYSTOP Apply 1 Application topically every 12 (twelve) hours as needed (skin breakdown).   ondansetron  4 MG tablet Commonly known as: ZOFRAN  Take 4 mg by mouth 3 (three) times daily as needed for nausea.   OXYGEN Inhale into the lungs. 2lpm for dyspnea or SOB   pantoprazole  40 MG tablet Commonly known as: PROTONIX  Take 40 mg by mouth daily.   PREDNISONE PO Take 40 mg by mouth daily.   PREDNISONE PO Take 5 mg by mouth daily.   THEREMS PO Take 1 tablet by mouth daily.   ticagrelor  90 MG Tabs tablet Commonly known as: BRILINTA  Take 90 mg by mouth 2 (two) times daily.   tizanidine  2 MG capsule Commonly known as: Zanaflex  Take 1 capsule (2 mg total) by mouth 3 (three) times daily.   traMADol  50 MG tablet Commonly known as: ULTRAM  Take 1 tablet (50 mg total) by mouth every 8 (eight) hours as needed for up to 5 days.   Trigels-F Forte Caps capsule Generic drug: Fe Fum-Vit C-Vit B12-FA Take 1 capsule by mouth 2 (two) times daily.   Tussin DM 10-100 MG/5ML liquid Generic drug: dextromethorphan-guaiFENesin Take 10 mLs by mouth every 4 (four) hours as needed for cough.   Vitamin D  (Ergocalciferol ) 1.25 MG  (50000 UNIT) Caps capsule Commonly known as: DRISDOL  Take 1 capsule (50,000 Units total) by mouth every 7 (seven) days.        Review of Systems  Immunization History  Administered Date(s) Administered   Influenza Inj Mdck Quad Pf 05/02/2018   Influenza Split 05/26/2015   Influenza, High Dose Seasonal PF 05/16/2019, 05/10/2020, 05/25/2021   Influenza-Unspecified 05/01/2012, 05/12/2013, 06/08/2016, 04/28/2017, 05/26/2023   Moderna Covid-19 Vaccine Bivalent Booster 28yrs & up 05/25/2021   PFIZER Comirnaty(Gray Top)Covid-19 Tri-Sucrose Vaccine 08/15/2019, 09/07/2019, 05/10/2020   PFIZER(Purple Top)SARS-COV-2 Vaccination 08/15/2019, 09/07/2019, 05/02/2020, 11/25/2020, 05/05/2023   Pneumococcal Conjugate-13 05/26/2015   Pneumococcal Polysaccharide-23 08/09/2011   Tdap 09/19/2023   Zoster Recombinant(Shingrix) 08/30/2018, 01/03/2019   Zoster, Live 08/09/2011   Pertinent  Health Maintenance Due  Topic Date Due   FOOT EXAM  Never done   OPHTHALMOLOGY EXAM  Never done   DEXA SCAN  Never done   HEMOGLOBIN A1C  09/13/2023   INFLUENZA VACCINE  03/08/2024      11/23/2021   11:00 AM 11/23/2021    8:00 PM 11/24/2021    9:00 AM 11/24/2021    8:10 PM 11/25/2021    8:00 AM  Fall Risk  (RETIRED) Patient Fall Risk Level Moderate fall risk Moderate fall risk Moderate fall risk Moderate fall risk Moderate fall risk   Functional Status Survey:    Vitals:   12/11/23 1118  BP: (!) 157/72  Pulse: 74  Resp: 16  Temp: (!) 97.3 F (36.3 C)  SpO2: 94%  Weight: 156 lb (70.8 kg)  Height: 5\' 2"  (1.575 m)   Body mass index is 28.53 kg/m. Physical Exam Musculoskeletal:     Comments: Limited ROM, ttp of right hip and lower back  Neurological:     Mental Status: She is alert.     Labs reviewed: Recent Labs    12/19/22 2251 12/20/22 0450 03/13/23 1034 11/23/23 0837 11/26/23 1906 11/30/23 0000  NA  --    < > 140 136 134* 136*  K  --    < > 3.5 3.4* 3.5 3.8  CL  --    < > 104 100 95*  99  CO2  --    < >  --  27 27 27*  GLUCOSE  --    < > 103* 121* 138*  --   BUN  --    < > 16 23 26* 25*  CREATININE  --    < > 1.10* 1.20* 1.22* 1.0  CALCIUM   --    < >  --  9.2 9.8 9.5  MG 1.4*  --   --   --   --   --    < > = values in this interval not displayed.   Recent Labs    12/20/22 0450 03/13/23 1029 11/23/23 0837  AST 17 29 25   ALT 15 26 29   ALKPHOS 49 51 50  BILITOT 0.8 0.7 0.7  PROT 6.1* 6.9 6.8  ALBUMIN 3.3* 3.6 3.7   Recent Labs    03/13/23 1029 03/13/23 1034 11/23/23 0837 11/26/23 1906 11/30/23 0000  WBC 7.2  --  9.4 10.8* 10.3  NEUTROABS 4.6  --  6.2 7.1 6,211.00  HGB 13.4   < > 13.1 13.6 11.8*  HCT 41.2   < > 39.1 39.5 35*  MCV 94.7  --  97.3 95.2  --   PLT 256  --  274 292 262   < > = values in this interval not displayed.   No results found for: "TSH" Lab Results  Component Value Date   HGBA1C 6.4 (H) 03/13/2023   Lab Results  Component Value Date   CHOL 112 03/14/2023   HDL 48 03/14/2023   LDLCALC 41 03/14/2023   TRIG 117 03/14/2023   CHOLHDL 2.3 03/14/2023    Significant Diagnostic Results in last 30 days:  CT HEAD WO CONTRAST Result Date: 11/26/2023 CLINICAL DATA:  Head trauma, moderate-to-severe. EXAM: CT HEAD WITHOUT CONTRAST TECHNIQUE: Contiguous axial images were obtained from the base of the skull through the vertex without intravenous contrast. RADIATION DOSE REDUCTION: This exam was performed according to the departmental dose-optimization program which includes automated exposure control, adjustment of the mA and/or kV according to patient size and/or use of iterative reconstruction technique. COMPARISON:  09/19/2023. FINDINGS: Brain: There is no acute intracranial hemorrhage, midline shift or mass effect. No extra-axial fluid collection is seen. Examination is limited due to artifact. Diffuse atrophy is noted. Periventricular white matter hypodensities are present bilaterally. No hydrocephalus is seen. 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 Vascular: No hyperdense vessel or unexpected calcification. Skull: Normal. Negative for fracture or focal lesion. Sinuses/Orbits: No acute finding. Other: A large scalp hematoma is present over the parietal bone on the right. IMPRESSION: 1. No acute intracranial process. 2. 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 Electronically Signed   By: Wyvonnia Heimlich M.D.   On: 11/26/2023 20:14   DG Chest Port 1 View Result Date: 11/26/2023 CLINICAL DATA:  Trauma, fall. EXAM: PORTABLE CHEST 1 VIEW COMPARISON:  11/23/2023. FINDINGS: The heart mildly enlarged and the mediastinal contour stable. There is atherosclerotic calcification of the aorta. Subsegmental atelectasis or scarring is noted in the mid left lung. No consolidation, effusion, or pneumothorax. Shoulder arthroplasty changes are present on the right. No acute fracture is seen. Bilateral breast implants are noted. IMPRESSION: Stable chest with no acute process. Electronically Signed   By: Wyvonnia Heimlich M.D.   On: 11/26/2023 20:08   DG Chest Port 1 View Result Date: 11/23/2023 CLINICAL DATA:  Chest pain EXAM: PORTABLE CHEST 1 VIEW COMPARISON:  03/13/2023 FINDINGS: The lungs are clear without focal pneumonia, edema, pneumothorax or  pleural effusion. Streaky densities at the left base are compatible with atelectasis or scarring. The cardiopericardial silhouette is within normal limits for size. No acute bony abnormality. Telemetry leads overlie the chest. IMPRESSION: Subtle left base atelectasis or scarring. Otherwise no acute findings. Electronically Signed   By: Donnal Fusi M.D.   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 Pain is alleviated by analgesics and worsens with movement. X-ray shows signs of arthritis without fracture. She prefers to avoid surgery and is open to a short course of oral  steroids as an alternative treatment. - Prescribe a short course of oral steroids for pain management. - Continue current analgesic regimen, including tramadol .  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 No numbness reported in the leg. - Encourage exercise as a non-pharmacological management strategy.   Family/ staff Communication: nursing  Labs/tests ordered:   none

## 2023-12-12 ENCOUNTER — Encounter: Payer: Self-pay | Admitting: Student

## 2023-12-13 ENCOUNTER — Other Ambulatory Visit: Payer: Self-pay | Admitting: Student

## 2023-12-13 DIAGNOSIS — M1611 Unilateral primary osteoarthritis, right hip: Secondary | ICD-10-CM

## 2023-12-13 MED ORDER — TRAMADOL HCL 50 MG PO TABS: 50.0000 mg | ORAL_TABLET | Freq: Two times a day (BID) | ORAL | 0 refills | Status: AC

## 2023-12-13 NOTE — Progress Notes (Signed)
Refill pain medication

## 2023-12-19 DIAGNOSIS — R2689 Other abnormalities of gait and mobility: Secondary | ICD-10-CM | POA: Diagnosis not present

## 2023-12-19 DIAGNOSIS — Z741 Need for assistance with personal care: Secondary | ICD-10-CM | POA: Diagnosis not present

## 2023-12-19 DIAGNOSIS — F0390 Unspecified dementia without behavioral disturbance: Secondary | ICD-10-CM | POA: Diagnosis not present

## 2023-12-19 DIAGNOSIS — E2839 Other primary ovarian failure: Secondary | ICD-10-CM | POA: Diagnosis present

## 2023-12-19 DIAGNOSIS — I5032 Chronic diastolic (congestive) heart failure: Secondary | ICD-10-CM | POA: Diagnosis not present

## 2023-12-19 DIAGNOSIS — R278 Other lack of coordination: Secondary | ICD-10-CM | POA: Diagnosis not present

## 2023-12-19 DIAGNOSIS — G8191 Hemiplegia, unspecified affecting right dominant side: Secondary | ICD-10-CM | POA: Diagnosis not present

## 2023-12-19 DIAGNOSIS — Z780 Asymptomatic menopausal state: Secondary | ICD-10-CM | POA: Diagnosis not present

## 2023-12-19 DIAGNOSIS — N183 Chronic kidney disease, stage 3 unspecified: Secondary | ICD-10-CM | POA: Diagnosis not present

## 2023-12-19 DIAGNOSIS — Z9181 History of falling: Secondary | ICD-10-CM | POA: Diagnosis not present

## 2023-12-19 DIAGNOSIS — R2681 Unsteadiness on feet: Secondary | ICD-10-CM | POA: Diagnosis not present

## 2023-12-19 DIAGNOSIS — E7800 Pure hypercholesterolemia, unspecified: Secondary | ICD-10-CM | POA: Diagnosis not present

## 2023-12-19 DIAGNOSIS — M6281 Muscle weakness (generalized): Secondary | ICD-10-CM | POA: Diagnosis not present

## 2023-12-19 DIAGNOSIS — M1611 Unilateral primary osteoarthritis, right hip: Secondary | ICD-10-CM | POA: Diagnosis not present

## 2023-12-19 DIAGNOSIS — M85851 Other specified disorders of bone density and structure, right thigh: Secondary | ICD-10-CM | POA: Diagnosis not present

## 2023-12-19 DIAGNOSIS — F321 Major depressive disorder, single episode, moderate: Secondary | ICD-10-CM | POA: Diagnosis not present

## 2023-12-19 DIAGNOSIS — I2584 Coronary atherosclerosis due to calcified coronary lesion: Secondary | ICD-10-CM | POA: Diagnosis not present

## 2023-12-19 DIAGNOSIS — R471 Dysarthria and anarthria: Secondary | ICD-10-CM | POA: Diagnosis not present

## 2023-12-19 DIAGNOSIS — E559 Vitamin D deficiency, unspecified: Secondary | ICD-10-CM | POA: Diagnosis not present

## 2023-12-19 DIAGNOSIS — I2510 Atherosclerotic heart disease of native coronary artery without angina pectoris: Secondary | ICD-10-CM | POA: Diagnosis not present

## 2023-12-19 DIAGNOSIS — G9389 Other specified disorders of brain: Secondary | ICD-10-CM | POA: Diagnosis not present

## 2023-12-19 DIAGNOSIS — G9340 Encephalopathy, unspecified: Secondary | ICD-10-CM | POA: Diagnosis not present

## 2023-12-19 DIAGNOSIS — R41841 Cognitive communication deficit: Secondary | ICD-10-CM | POA: Diagnosis not present

## 2023-12-19 DIAGNOSIS — R4189 Other symptoms and signs involving cognitive functions and awareness: Secondary | ICD-10-CM | POA: Diagnosis not present

## 2023-12-19 DIAGNOSIS — Z0000 Encounter for general adult medical examination without abnormal findings: Secondary | ICD-10-CM | POA: Diagnosis not present

## 2023-12-19 DIAGNOSIS — E1151 Type 2 diabetes mellitus with diabetic peripheral angiopathy without gangrene: Secondary | ICD-10-CM | POA: Diagnosis not present

## 2023-12-19 DIAGNOSIS — F329 Major depressive disorder, single episode, unspecified: Secondary | ICD-10-CM | POA: Diagnosis not present

## 2023-12-19 DIAGNOSIS — E1122 Type 2 diabetes mellitus with diabetic chronic kidney disease: Secondary | ICD-10-CM | POA: Diagnosis not present

## 2023-12-19 DIAGNOSIS — I739 Peripheral vascular disease, unspecified: Secondary | ICD-10-CM | POA: Diagnosis not present

## 2023-12-20 ENCOUNTER — Encounter: Payer: Self-pay | Admitting: Student

## 2023-12-20 ENCOUNTER — Non-Acute Institutional Stay: Payer: Self-pay | Admitting: Student

## 2023-12-20 DIAGNOSIS — G8191 Hemiplegia, unspecified affecting right dominant side: Secondary | ICD-10-CM

## 2023-12-20 NOTE — Progress Notes (Signed)
 Location:  Other [place].  Nursing Home Room Number: [facility] [address] Place of Service:  SNF [phone]) Provider:  Valrie Gehrig, MD  Patient Care Team: Valrie Gehrig, MD as PCP - General (Family Medicine) Valrie Gehrig, MD [facility] Medicine)  Extended Emergency Contact Information Primary Emergency Contact: Alveda Jing Address: [place], [name] [address] [place] Home Phone: [phone] Relation: Daughter Secondary Emergency Contact: [name]: [phone] Relation: Sister  Code Status:  Full Code Goals of care: Advanced Directive information    12/11/2023   11:37 AM  Advanced Directives  Does Patient Have a Medical Advance Directive? Yes  Type of Estate agent of [place];Living will  Does patient want to make changes to medical advance directive? No - Patient declined  Copy of Healthcare Power of Attorney in Chart? Yes - validated most recent copy scanned in chart (See row information)     Chief Complaint  Patient presents with   Hip Pain    Follow up Hip Pain.     HPI:  Pt is a 83 y.o. female seen today for Follow up Hip Pain.  Patient's hip pain has started to improve, however, nursing states she has continued to have near falling episodes requiring 2 person assist.  Spoke with Dr. Luster Salters from [facility] regarding her progress. The goal is to continue therapy in hope she will have improvement to return to assisted living and regaining prvious levels of independence. He states at this time she will have 18-20 more days of covered skilled time before a NOMNC will be placed.   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 b.) Doppler 12/30/2014: >70% LICA and 70% RICA. c.) Doppler 10/25/16: >70% Bilater ICAs   Chronic bilateral low back pain with  left-sided sciatica    CKD (chronic kidney disease), stage III (HCC)    Coronary artery disease    a.) LHC 12/24/2014 -> small LAD system; diffuse CTO of LAD. 60% RCA. LM and LCx with no sig disease. no intervention; med mgmt.   Degenerative arthritis    Diastolic dysfunction    a.) TTE 09/20/2018: EF 55%, G1DD, mild LAE and RVE, triv-mild panvalvular regurgitation.   Dysarthria    GERD (gastroesophageal reflux disease)    Hepatic steatosis    History of kidney stones    HLD (hyperlipidemia)    Hypertension    Kidney stone    Lumbar radiculopathy    Major depression in remission (HCC)    PVD (peripheral vascular disease) (HCC)    Renal cyst, right 06/02/2015   a.) CT 06/02/2015 --> 4.4 cm RIGHT renal mass; MRI recommended. b.) MRI 06/16/2015 --> 3.8 x 4.2 x 4.0 cm proteinaceous/hemmorhagic cyst.   Subclavian steal syndrome    a.) LHC 12/24/2014 --> tight eccentric 90% ostial stenosis with damping.   T2DM (type 2 diabetes mellitus) [facility])    Past Surgical History:  Procedure Laterality Date   ABDOMINAL HYSTERECTOMY     AUGMENTATION MAMMAPLASTY Bilateral 1980   BACK SURGERY  09/2018   BICEPT TENODESIS  06/10/2021   Procedure: BICEPS TENODESIS;  Surgeon: Elner Hahn, MD;  Location: [facility] ORS;  Service: Orthopedics;;   CARDIAC CATHETERIZATION Left 12/24/2014   Procedure: LEFT HEARD CATHETERIZATION; Location: [facility]; Surgeon: Dorean Gambles, MD  CHOLECYSTECTOMY     COLONOSCOPY N/A 02/11/2016   Procedure: COLONOSCOPY;  Surgeon: Cassie Click, MD;  Location: [facility] ENDOSCOPY;  Service: Endoscopy;  Laterality: N/A;   IR CT HEAD LTD  11/16/2021   IR CT HEAD LTD  11/16/2021   IR INTRAVSC STENT CERV CAROTID W/O EMB-PROT MOD SED INC ANGIO  11/18/2021   IR PERCUTANEOUS ART THROMBECTOMY/INFUSION INTRACRANIAL INC DIAG ANGIO  11/16/2021   RADIOLOGY WITH ANESTHESIA N/A 11/16/2021   Procedure: IR WITH ANESTHESIA- CODE STROKE;  Surgeon: Radiologist, Medication, MD;  Location: [facility] OR;  Service: Radiology;   Laterality: N/A;   REVERSE SHOULDER ARTHROPLASTY Right 06/10/2021   Procedure: REVERSE SHOULDER ARTHROPLASTY;  Surgeon: Elner Hahn, MD;  Location: [facility] ORS;  Service: Orthopedics;  Laterality: Right;    Allergies  Allergen Reactions   Sulfa Antibiotics Other (See Comments)    Unknown reaction    Outpatient Encounter Medications as of 12/20/2023  Medication Sig   acetaminophen  (TYLENOL ) 325 MG tablet Take 650 mg by mouth every 4 (four) hours as needed (discomfort, elevated temperature).   alum & mag hydroxide-simeth (MAALOX/MYLANTA) 200-200-20 MG/5 SUSP Apply 30 mLs topically every 4 (four) hours as needed (gas, indigestion, upset stomach).   aspirin  81 MG chewable tablet Chew 1 tablet (81 mg total) by mouth daily.   atorvastatin  (LIPITOR ) 80 MG tablet Take 1 tablet (80 mg total) by mouth daily.   bismuth subsalicylate (KAOPECTATE) 262 MG/15ML suspension Take 10 mLs by mouth as needed for diarrhea or loose stools.   Carbamide Peroxide (DEBROX OT) Place 5 drops into both ears as needed (ear wax).   carvedilol  (COREG ) 3.125 MG tablet Take 3.125 mg by mouth 2 (two) times daily.   cetirizine (ZYRTEC) 5 MG tablet Take 5 mg by mouth as needed for allergies.   chlorthalidone  (HYGROTON ) 25 MG tablet Take 1 tablet (25 mg total) by mouth daily.   cholecalciferol (VITAMIN D3) 25 MCG (1000 UNIT) tablet Take 1,000 Units by mouth daily.   clopidogrel  (PLAVIX ) 75 MG tablet Take 1 tablet (75 mg total) by mouth daily.   docusate sodium  (COLACE) 100 MG capsule Take 100 mg by mouth 2 (two) times daily.   escitalopram  (LEXAPRO ) 10 MG tablet Take 1 tablet (10 mg total) by mouth daily.   ezetimibe  (ZETIA ) 10 MG tablet Take 1 tablet (10 mg total) by mouth daily.   Fe Fum-Vit C-Vit B12-FA (TRIGELS-F FORTE) CAPS capsule Take 1 capsule by mouth 2 (two) times daily.   ferrous sulfate 325 (65 FE) MG EC tablet Take 325 mg by mouth daily with breakfast.   Glucose 15 GM/32ML GEL Take 1 packet by mouth as needed (low  blood sugar).   isosorbide  mononitrate (IMDUR ) 30 MG 24 hr tablet Take 30 mg by mouth daily.   losartan  (COZAAR ) 100 MG tablet Take 1 tablet (100 mg total) by mouth daily.   magnesium  chloride (SLOW-MAG) 64 MG TBEC SR tablet Take 1 tablet by mouth daily.   magnesium  hydroxide (MILK OF MAGNESIA) 400 MG/5ML suspension Take 30 mLs by mouth as needed (constipation).   Multiple Vitamin (THEREMS PO) Take 1 tablet by mouth daily.   nitroGLYCERIN  (NITROSTAT ) 0.4 MG SL tablet Place 0.4 mg under the tongue every 5 (five) minutes as needed for chest pain.   nystatin (MYCOSTATIN/NYSTOP) powder Apply 1 Application topically every 12 (twelve) hours as needed (skin breakdown).   ondansetron  (ZOFRAN ) 4 MG tablet Take 4 mg by mouth 3 (three) times daily as needed for nausea.   pantoprazole  (PROTONIX )  40 MG tablet Take 40 mg by mouth daily.   ticagrelor  (BRILINTA ) 90 MG TABS tablet Take 90 mg by mouth 2 (two) times daily.   tizanidine  (ZANAFLEX ) 2 MG capsule Take 1 capsule (2 mg total) by mouth 3 (three) times daily.   traMADol  (ULTRAM ) 50 MG tablet Take 1 tablet (50 mg total) by mouth 2 (two) times daily for 14 days. (Patient taking differently: Take 50 mg by mouth every 6 (six) hours as needed.)   Vitamin D , Ergocalciferol , (DRISDOL ) 1.25 MG (50000 UNIT) CAPS capsule Take 1 capsule (50,000 Units total) by mouth every 7 (seven) days.   OXYGEN Inhale into the lungs. 2lpm for dyspnea or SOB (Patient not taking: Reported on 12/20/2023)   PREDNISONE PO Take 40 mg by mouth daily. (Patient not taking: Reported on 12/20/2023)   PREDNISONE PO Take 5 mg by mouth daily. (Patient not taking: Reported on 12/20/2023)   No facility-administered encounter medications on file as of 12/20/2023.    Review of Systems  Immunization History  Administered Date(s) Administered   Influenza Inj Mdck Quad Pf 05/02/2018   Influenza Split 05/26/2015   Influenza, High Dose Seasonal PF 05/16/2019, 05/10/2020, 05/25/2021    Influenza-Unspecified 05/01/2012, 05/12/2013, 06/08/2016, 04/28/2017, 05/26/2023   Moderna Covid-19 Vaccine Bivalent Booster 35yrs & up 05/25/2021   PFIZER Comirnaty(Gray Top)Covid-19 Tri-Sucrose Vaccine 08/15/2019, 09/07/2019, 05/10/2020   PFIZER(Purple Top)SARS-COV-2 Vaccination 08/15/2019, 09/07/2019, 05/02/2020, 11/25/2020, 05/05/2023   Pneumococcal Conjugate-13 05/26/2015   Pneumococcal Polysaccharide-23 08/09/2011   Tdap 09/19/2023   Zoster Recombinant(Shingrix) 08/30/2018, 01/03/2019   Zoster, Live 08/09/2011   Pertinent  Health Maintenance Due  Topic Date Due   FOOT EXAM  Never done   OPHTHALMOLOGY EXAM  Never done   DEXA SCAN  Never done   HEMOGLOBIN A1C  09/13/2023   INFLUENZA VACCINE  03/08/2024      11/23/2021   11:00 AM 11/23/2021    8:00 PM 11/24/2021    9:00 AM 11/24/2021    8:10 PM 11/25/2021    8:00 AM  Fall Risk  (RETIRED) Patient Fall Risk Level Moderate fall risk Moderate fall risk Moderate fall risk Moderate fall risk Moderate fall risk   Functional Status Survey:    Vitals:   12/20/23 1612  BP: 133/70  Pulse: 73  Resp: 16  Temp: (!) 97.4 F (36.3 C)  SpO2: 92%  Weight: 155 lb 6.4 oz (70.5 kg)  Height: 5\' 2"  (1.575 m)   Body mass index is 28.42 kg/m. Physical Exam Neurological:     Mental Status: She is alert.     Labs reviewed: Recent Labs    03/13/23 1034 11/23/23 0837 11/26/23 1906 11/30/23 0000  NA 140 136 134* 136*  K 3.5 3.4* 3.5 3.8  CL 104 100 95* 99  CO2  --  27 27 27*  GLUCOSE 103* 121* 138*  --   BUN 16 23 26* 25*  CREATININE 1.10* 1.20* 1.22* 1.0  CALCIUM   --  9.2 9.8 9.5   Recent Labs    03/13/23 1029 11/23/23 0837  AST 29 25  ALT 26 29  ALKPHOS 51 50  BILITOT 0.7 0.7  PROT 6.9 6.8  ALBUMIN 3.6 3.7   Recent Labs    03/13/23 1029 03/13/23 1034 11/23/23 0837 11/26/23 1906 11/30/23 0000  WBC 7.2  --  9.4 10.8* 10.3  NEUTROABS 4.6  --  6.2 7.1 6,211.00  HGB 13.4   < > 13.1 13.6 11.8*  HCT 41.2   < > 39.1  39.5 35*  MCV 94.7  --  97.3 95.2  --   PLT 256  --  274 292 262   < > = values in this interval not displayed.   No results found for: "TSH" Lab Results  Component Value Date   HGBA1C 6.4 (H) 03/13/2023   Lab Results  Component Value Date   CHOL 112 03/14/2023   HDL 48 03/14/2023   LDLCALC 41 03/14/2023   TRIG 117 03/14/2023   CHOLHDL 2.3 03/14/2023    Significant Diagnostic Results in last 30 days:  CT HEAD WO CONTRAST Result Date: 11/26/2023 CLINICAL DATA:  Head trauma, moderate-to-severe. EXAM: CT HEAD WITHOUT CONTRAST TECHNIQUE: Contiguous axial images were obtained from the base of the skull through the vertex without intravenous contrast. RADIATION DOSE REDUCTION: This exam was performed according to the departmental dose-optimization program which includes automated exposure control, adjustment of the mA and/or kV according to patient size and/or use of iterative reconstruction technique. COMPARISON:  09/19/2023. FINDINGS: Brain: There is no acute intracranial hemorrhage, midline shift or mass effect. No extra-axial fluid collection is seen. Examination is limited due to artifact. Diffuse atrophy is noted. Periventricular white matter hypodensities are present bilaterally. No hydrocephalus is seen. 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 Vascular: No hyperdense vessel or unexpected calcification. Skull: Normal. Negative for fracture or focal lesion. Sinuses/Orbits: No acute finding. Other: A large scalp hematoma is present over the parietal bone on the right. IMPRESSION: 1. No acute intracranial process. 2. 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 Electronically Signed   By: Wyvonnia Heimlich M.D.   On: 11/26/2023 20:14   DG Chest Port 1 View Result Date: 11/26/2023 CLINICAL DATA:  Trauma, fall. EXAM: PORTABLE CHEST 1 VIEW COMPARISON:  11/23/2023. FINDINGS: The heart mildly enlarged and the mediastinal contour stable. There is  atherosclerotic calcification of the aorta. Subsegmental atelectasis or scarring is noted in the mid left lung. No consolidation, effusion, or pneumothorax. Shoulder arthroplasty changes are present on the right. No acute fracture is seen. Bilateral breast implants are noted. IMPRESSION: Stable chest with no acute process. Electronically Signed   By: Wyvonnia Heimlich M.D.   On: 11/26/2023 20:08   DG Chest Port 1 View Result Date: 11/23/2023 CLINICAL DATA:  Chest pain EXAM: PORTABLE CHEST 1 VIEW COMPARISON:  03/13/2023 FINDINGS: The lungs are clear without focal pneumonia, edema, pneumothorax or pleural effusion. Streaky densities at the left base are compatible with atelectasis or scarring. The cardiopericardial silhouette is within normal limits for size. No acute bony abnormality. Telemetry leads overlie the chest. IMPRESSION: Subtle left base atelectasis or scarring. Otherwise no acute findings. Electronically Signed   By: Donnal Fusi M.D.   On: 11/23/2023 09:34    Assessment/Plan Right hemiparesis [facility]) Patient was admitted to [facility] for therapy and is continuing to stay with a goal of regaining previous levels of independence.  Family/ staff Communication: nursing  Labs/tests ordered:  none

## 2023-12-22 LAB — CBC AND DIFFERENTIAL
HCT: 40 (ref 36–46)
Hemoglobin: 13.2 (ref 12.0–16.0)
Neutrophils Absolute: 5534
Platelets: 315 10*3/uL (ref 150–400)
WBC: 9.3

## 2023-12-22 LAB — CBC: RBC: 4.07 (ref 3.87–5.11)

## 2023-12-23 ENCOUNTER — Encounter: Payer: Self-pay | Admitting: Student

## 2024-01-02 ENCOUNTER — Non-Acute Institutional Stay (SKILLED_NURSING_FACILITY): Payer: Self-pay | Admitting: Nurse Practitioner

## 2024-01-02 ENCOUNTER — Encounter: Payer: Self-pay | Admitting: Nurse Practitioner

## 2024-01-02 DIAGNOSIS — M1611 Unilateral primary osteoarthritis, right hip: Secondary | ICD-10-CM

## 2024-01-02 DIAGNOSIS — I2584 Coronary atherosclerosis due to calcified coronary lesion: Secondary | ICD-10-CM

## 2024-01-02 DIAGNOSIS — N183 Chronic kidney disease, stage 3 unspecified: Secondary | ICD-10-CM

## 2024-01-02 DIAGNOSIS — E1122 Type 2 diabetes mellitus with diabetic chronic kidney disease: Secondary | ICD-10-CM | POA: Diagnosis not present

## 2024-01-02 DIAGNOSIS — F321 Major depressive disorder, single episode, moderate: Secondary | ICD-10-CM

## 2024-01-02 DIAGNOSIS — G8191 Hemiplegia, unspecified affecting right dominant side: Secondary | ICD-10-CM

## 2024-01-02 DIAGNOSIS — I5032 Chronic diastolic (congestive) heart failure: Secondary | ICD-10-CM

## 2024-01-02 DIAGNOSIS — I2510 Atherosclerotic heart disease of native coronary artery without angina pectoris: Secondary | ICD-10-CM | POA: Diagnosis not present

## 2024-01-02 DIAGNOSIS — F0390 Unspecified dementia without behavioral disturbance: Secondary | ICD-10-CM | POA: Diagnosis not present

## 2024-01-02 LAB — PROTEIN / CREATININE RATIO, URINE
Albumin, U: 4.8
Creatinine, Urine: 48

## 2024-01-02 LAB — MICROALBUMIN / CREATININE URINE RATIO: Microalb Creat Ratio: 100

## 2024-01-02 NOTE — Assessment & Plan Note (Signed)
 Stable at this time, continue to be involved in activities at facility and lexapro 

## 2024-01-02 NOTE — Assessment & Plan Note (Signed)
 Pain and mobility much improved, continues on tramadol  PRN

## 2024-01-02 NOTE — Assessment & Plan Note (Signed)
 Stable, continues with support from family and staff.

## 2024-01-02 NOTE — Assessment & Plan Note (Signed)
 Diet controlled, will follow up A1c and urine microalbumin Encouraged dietary compliance, routine foot care/monitoring and to keep up with diabetic eye exams through ophthalmology

## 2024-01-02 NOTE — Assessment & Plan Note (Addendum)
 Euvolemic at this time, continues on losartan  and coreg 

## 2024-01-02 NOTE — Assessment & Plan Note (Addendum)
 Stable without chest pains at [facility] stime, continues on ASA with coreg , losartan  and statin

## 2024-01-02 NOTE — Assessment & Plan Note (Addendum)
 Ongoing, continues therapy and orthotics

## 2024-01-02 NOTE — Progress Notes (Signed)
 Location:  [facility]  [facility]ing Home Room Number: [facility] [address] Place of Service:  SNF ([phone]) [facility]  [facility]: Cassidy Gehrig, MD  Patient Care Team: Cassidy Gehrig, MD as [facility] - General (Family Hernandez) Cassidy Gehrig, MD [facility])  Extended Emergency Contact Information Primary Emergency Contact: Cassidy Hernandez Address: [place], [name] [address] [place] Home Phone: [phone] Relation: Daughter Secondary Emergency Contact: Cassidy Hernandez Mobile Phone: [phone] Relation: Sister  Goals of care: Advanced Directive information    12/11/2023   11:37 AM  Advanced Directives  Does Patient Have a Medical Advance Directive? Yes  Type of Estate agent of [facility];Living will  Does patient want to make changes to medical advance directive? No - Patient declined  Copy of Healthcare Power of Attorney in Chart? Yes - validated most recent copy scanned in chart (See row information)     Chief Complaint  Patient presents with   Medical Management of Chronic Issues    Medical Management of Chronic Issues.     HPI:  Pt is a 83 y.o. female seen today for medical management of chronic disease.  She has hx of cva with right sided hemiparesis meeting [facility] to make sure right foot brace is fitting correctly to optimize her ambulation. She is working to gain strength to get back to [place]. Her right hip pain has improved and she is able to ambulate better without pain.  Daughter reports she is staying in her room most of the time. Pt denies worsening anxiety or depression. Reports she is sleeping well. Denies chest pains, shortness of breath, palpitations or worsening LE edema.    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 b.) Doppler 62/95/2841: >70%  LICA and 70% RICA. c.) Doppler 10/25/16: >70% Bilater ICAs   Chronic bilateral low back pain with left-sided sciatica    CKD (chronic kidney disease), stage III (HCC)    Coronary artery disease    a.) LHC 12/24/2014 -> small LAD system; diffuse CTO of LAD. 60% RCA. LM and LCx with no sig disease. no intervention; med mgmt.   Degenerative arthritis    Diastolic dysfunction    a.) TTE 09/20/2018: EF 55%, G1DD, mild LAE and RVE, triv-mild panvalvular regurgitation.   Dysarthria    GERD (gastroesophageal reflux disease)    Hepatic steatosis    History of kidney stones    HLD (hyperlipidemia)    Hypertension    Kidney stone    Lumbar radiculopathy    Major depression in remission (HCC)    PVD (peripheral vascular disease) (HCC)    Renal cyst, right 06/02/2015   a.) CT 06/02/2015 --> 4.4 cm RIGHT renal mass; MRI recommended. b.) MRI 06/16/2015 --> 3.8 x 4.2 x 4.0 cm proteinaceous/hemmorhagic cyst.   Subclavian steal syndrome    a.) LHC 12/24/2014 --> tight eccentric 90% ostial stenosis with damping.   T2DM (type 2 diabetes mellitus) [facility])    Past Surgical History:  Procedure Laterality Date   ABDOMINAL HYSTERECTOMY     AUGMENTATION MAMMAPLASTY Bilateral 1980   BACK SURGERY  09/2018   BICEPT TENODESIS  06/10/2021   Procedure: BICEPS TENODESIS;  Surgeon: Cassidy Hahn, MD;  Location: [facility] ORS;  Service: Orthopedics;;   CARDIAC CATHETERIZATION Left 12/24/2014  Procedure: LEFT HEARD CATHETERIZATION; Location: [facility]; Surgeon: Cassidy Gambles, MD   CHOLECYSTECTOMY     COLONOSCOPY N/A 02/11/2016   Procedure: COLONOSCOPY;  Surgeon: Cassidy Click, MD;  Location: [facility] ENDOSCOPY;  Service: Endoscopy;  Laterality: N/A;   IR CT HEAD LTD  11/16/2021   IR CT HEAD LTD  11/16/2021   IR INTRAVSC STENT CERV CAROTID W/O EMB-PROT MOD SED INC ANGIO  11/18/2021   IR PERCUTANEOUS ART THROMBECTOMY/INFUSION INTRACRANIAL INC DIAG ANGIO  11/16/2021   RADIOLOGY WITH ANESTHESIA N/A 11/16/2021   Procedure: IR WITH  ANESTHESIA- CODE STROKE;  Surgeon: [facility], Medication, MD;  Location: [facility] OR;  Service: Radiology;  Laterality: N/A;   REVERSE SHOULDER ARTHROPLASTY Right 06/10/2021   Procedure: REVERSE SHOULDER ARTHROPLASTY;  Surgeon: Cassidy Hahn, MD;  Location: [facility] ORS;  Service: Orthopedics;  Laterality: Right;    Allergies  Allergen Reactions   Sulfa Antibiotics [facility] (See Comments)    Unknown reaction    Outpatient Encounter Medications as of 01/02/2024  Medication Sig   aspirin  81 MG chewable tablet Chew 1 tablet (81 mg total) by mouth daily.   atorvastatin  (LIPITOR ) 80 MG tablet Take 1 tablet (80 mg total) by mouth daily.   carvedilol  (COREG ) 3.125 MG tablet Take 3.125 mg by mouth 2 (two) times daily.   chlorthalidone  (HYGROTON ) 25 MG tablet Take 1 tablet (25 mg total) by mouth daily.   cholecalciferol (VITAMIN D3) 25 MCG (1000 UNIT) tablet Take 1,000 Units by mouth daily.   clopidogrel  (PLAVIX ) 75 MG tablet Take 1 tablet (75 mg total) by mouth daily.   docusate sodium  (COLACE) 100 MG capsule Take 100 mg by mouth 2 (two) times daily.   escitalopram  (LEXAPRO ) 10 MG tablet Take 1 tablet (10 mg total) by mouth daily.   ezetimibe  (ZETIA ) 10 MG tablet Take 1 tablet (10 mg total) by mouth daily.   ferrous sulfate 325 (65 FE) MG EC tablet Take 325 mg by mouth daily with breakfast.   losartan  (COZAAR ) 100 MG tablet Take 1 tablet (100 mg total) by mouth daily.   magnesium  chloride (SLOW-MAG) 64 MG TBEC SR tablet Take 1 tablet by mouth daily.   Multiple Vitamin (THEREMS PO) Take 1 tablet by mouth daily.   nitroGLYCERIN  (NITROSTAT ) 0.4 MG SL tablet Place 0.4 mg under the tongue every 5 (five) minutes as needed for chest pain.   pantoprazole  (PROTONIX ) 40 MG tablet Take 40 mg by mouth daily.   tizanidine  (ZANAFLEX ) 2 MG capsule Take 1 capsule (2 mg total) by mouth 3 (three) times daily.   traMADol  (ULTRAM ) 50 MG tablet Take 50 mg by mouth every 6 (six) hours as needed.   Glucose 15 GM/32ML GEL Take 1  packet by mouth as needed (low blood sugar).   [DISCONTINUED] acetaminophen  (TYLENOL ) 325 MG tablet Take 650 mg by mouth every 4 (four) hours as needed (discomfort, elevated temperature). (Patient not taking: Reported on 01/02/2024)   [DISCONTINUED] alum & mag hydroxide-simeth (MAALOX/MYLANTA) 200-200-20 MG/5 SUSP Apply 30 mLs topically every 4 (four) hours as needed (gas, indigestion, upset stomach). (Patient not taking: Reported on 01/02/2024)   [DISCONTINUED] bismuth subsalicylate (KAOPECTATE) 262 MG/15ML suspension Take 10 mLs by mouth as needed for diarrhea or loose stools. (Patient not taking: Reported on 01/02/2024)   [DISCONTINUED] Carbamide Peroxide (DEBROX OT) Place 5 drops into both ears as needed (ear wax). (Patient not taking: Reported on 01/02/2024)   [DISCONTINUED] cetirizine (ZYRTEC) 5 MG tablet Take 5 mg by mouth as needed for allergies. (Patient not taking:  Reported on 01/02/2024)   [DISCONTINUED] Fe Fum-Vit C-Vit B12-FA (TRIGELS-F FORTE) CAPS capsule Take 1 capsule by mouth 2 (two) times daily. (Patient not taking: Reported on 01/02/2024)   [DISCONTINUED] isosorbide  mononitrate (IMDUR ) 30 MG 24 hr tablet Take 30 mg by mouth daily. (Patient not taking: Reported on 01/02/2024)   [DISCONTINUED] magnesium  hydroxide (MILK OF MAGNESIA) 400 MG/5ML suspension Take 30 mLs by mouth as needed (constipation). (Patient not taking: Reported on 01/02/2024)   [DISCONTINUED] nystatin (MYCOSTATIN/NYSTOP) powder Apply 1 Application topically every 12 (twelve) hours as needed (skin breakdown). (Patient not taking: Reported on 01/02/2024)   [DISCONTINUED] ondansetron  (ZOFRAN ) 4 MG tablet Take 4 mg by mouth 3 (three) times daily as needed for nausea. (Patient not taking: Reported on 01/02/2024)   [DISCONTINUED] Vitamin D , Ergocalciferol , (DRISDOL ) 1.25 MG (50000 UNIT) CAPS capsule Take 1 capsule (50,000 Units total) by mouth every 7 (seven) days. (Patient not taking: Reported on 01/02/2024)   No facility-administered  encounter medications on file as of 01/02/2024.    Review of Systems  Constitutional:  Negative for activity change, appetite change, fatigue and unexpected weight change.  HENT:  Negative for congestion and hearing loss.   Eyes: Negative.   Respiratory:  Negative for cough and shortness of breath.   Cardiovascular:  Negative for chest pain, palpitations and leg swelling.  Gastrointestinal:  Negative for abdominal pain, constipation and diarrhea.  Genitourinary:  Negative for difficulty urinating and dysuria.  Musculoskeletal:  Negative for arthralgias and myalgias.  Skin:  Negative for color change and wound.  Neurological:  Positive for weakness. Negative for dizziness.  Psychiatric/Behavioral:  Negative for agitation, behavioral problems and confusion.      Immunization History  Administered Date(s) Administered   Influenza Inj Mdck Quad Pf 05/02/2018   Influenza Split 05/26/2015   Influenza, High Dose Seasonal PF 05/16/2019, 05/10/2020, 05/25/2021   Influenza-Unspecified 05/01/2012, 05/12/2013, 06/08/2016, 04/28/2017, 05/26/2023   Moderna Covid-19 Vaccine Bivalent Booster 55yrs & up 05/25/2021   PFIZER Comirnaty(Gray Top)Covid-19 Tri-Sucrose Vaccine 08/15/2019, 09/07/2019, 05/10/2020   PFIZER(Purple Top)SARS-COV-2 Vaccination 08/15/2019, 09/07/2019, 05/02/2020, 11/25/2020, 05/05/2023   Pneumococcal Conjugate-13 05/26/2015   Pneumococcal Polysaccharide-23 08/09/2011   Tdap 09/19/2023   Zoster Recombinant(Shingrix) 08/30/2018, 01/03/2019   Zoster, Live 08/09/2011   Pertinent  Health Maintenance Due  Topic Date Due   FOOT EXAM  Never done   OPHTHALMOLOGY EXAM  Never done   DEXA SCAN  Never done   HEMOGLOBIN A1C  09/13/2023   INFLUENZA VACCINE  03/08/2024      11/23/2021   11:00 AM 11/23/2021    8:00 PM 11/24/2021    9:00 AM 11/24/2021    8:10 PM 11/25/2021    8:00 AM  Fall Risk  (RETIRED) Patient Fall Risk Level Moderate fall risk Moderate fall risk Moderate fall risk  Moderate fall risk Moderate fall risk   Functional Status Survey:    Vitals:   01/02/24 0825 01/02/24 0832  BP: (!) 153/66 126/63  Pulse: 80   Resp: 20   Temp: 97.7 F (36.5 C)   SpO2: 94%   Weight: 153 lb 3.2 oz (69.5 kg)   Height: 5\' 2"  (1.575 m)    Body mass index is 28.02 kg/m. Physical Exam Constitutional:      General: She is not in acute distress.    Appearance: She is well-developed. She is not diaphoretic.  HENT:     Head: Normocephalic and atraumatic.     Mouth/Throat:     Pharynx: No oropharyngeal exudate.  Eyes:  Conjunctiva/sclera: Conjunctivae normal.     Pupils: Pupils are equal, round, and reactive to light.  Cardiovascular:     Rate and Rhythm: Normal rate and regular rhythm.     Heart sounds: Normal heart sounds.  Pulmonary:     Effort: Pulmonary effort is normal.     Breath sounds: Normal breath sounds.  Abdominal:     General: Bowel sounds are normal.     Palpations: Abdomen is soft.  Musculoskeletal:     Cervical back: Normal range of motion and neck supple.     Right lower leg: No edema.     Left lower leg: No edema.  Skin:    General: Skin is warm and dry.  Neurological:     Mental Status: She is alert. Mental status is at baseline.     Motor: Weakness present.     Gait: Gait abnormal.     Comments: Right sided hemiparesis   Psychiatric:        Mood and Affect: Mood normal.     Labs reviewed: Recent Labs    03/13/23 1034 11/23/23 0837 11/26/23 1906 11/30/23 0000  NA 140 136 134* 136*  K 3.5 3.4* 3.5 3.8  CL 104 100 95* 99  CO2  --  27 27 27*  GLUCOSE 103* 121* 138*  --   BUN 16 23 26* 25*  CREATININE 1.10* 1.20* 1.22* 1.0  CALCIUM   --  9.2 9.8 9.5   Recent Labs    03/13/23 1029 11/23/23 0837  AST 29 25  ALT 26 29  ALKPHOS 51 50  BILITOT 0.7 0.7  PROT 6.9 6.8  ALBUMIN 3.6 3.7   Recent Labs    03/13/23 1029 03/13/23 1034 11/23/23 0837 11/26/23 1906 11/30/23 0000 12/22/23 0000  WBC 7.2  --  9.4 10.8* 10.3  9.3  NEUTROABS 4.6  --  6.2 7.1 6,211.00 5,534.00  HGB 13.4   < > 13.1 13.6 11.8* 13.2  HCT 41.2   < > 39.1 39.5 35* 40  MCV 94.7  --  97.3 95.2  --   --   PLT 256  --  274 292 262 315   < > = values in this interval not displayed.   No results found for: "TSH" [facility] Results  Component Value Date   HGBA1C 6.4 (H) 03/13/2023   [facility] Results  Component Value Date   CHOL 112 03/14/2023   HDL 48 03/14/2023   LDLCALC 41 03/14/2023   TRIG 117 03/14/2023   CHOLHDL 2.3 03/14/2023    Significant Diagnostic Results in last 30 days:  No results found.  Assessment/Plan Arthritis of right hip Pain and mobility much improved, continues on tramadol  PRN  CAD (coronary artery disease) Stable without chest pains at [facility] stime, continues on ASA with coreg , losartan  and statin  Chronic diastolic (congestive) heart failure (HCC) Euvolemic at this time, continues on losartan  and coreg   Controlled type 2 diabetes mellitus with stage 3 chronic kidney disease, without long-term current use of insulin  (HCC) Diet controlled, will follow up A1c and urine microalbumin Encouraged dietary compliance, routine foot care/monitoring and to keep up with diabetic eye exams through ophthalmology   Major depressive disorder, single episode, moderate (HCC) Stable at this time, continue to be involved in activities at facility and lexapro   Major neurocognitive disorder (HCC) Stable, continues with support from family and staff.   Right hemiparesis (HCC) Ongoing, continues therapy and orthotics       [facility] K. Denney Fisherman [facility] [phone]

## 2024-01-04 ENCOUNTER — Telehealth: Payer: Self-pay | Admitting: *Deleted

## 2024-01-04 ENCOUNTER — Non-Acute Institutional Stay (SKILLED_NURSING_FACILITY): Payer: Self-pay | Admitting: Nurse Practitioner

## 2024-01-04 ENCOUNTER — Other Ambulatory Visit: Payer: Self-pay | Admitting: Nurse Practitioner

## 2024-01-04 DIAGNOSIS — Z0000 Encounter for general adult medical examination without abnormal findings: Secondary | ICD-10-CM

## 2024-01-04 DIAGNOSIS — E2839 Other primary ovarian failure: Secondary | ICD-10-CM

## 2024-01-04 LAB — HEMOGLOBIN A1C: Hemoglobin A1C: 6.1

## 2024-01-04 NOTE — Progress Notes (Signed)
 Subjective:   Cassidy Hernandez is a 83 y.o. female who presents for Medicare Annual (Subsequent) preventive examination.  [facility]   Cardiac Risk Factors include: advanced age (>73men, >33 women);sedentary lifestyle;hypertension;dyslipidemia     Objective:     There were no vitals filed for this visit. There is no height or weight on file to calculate BMI.     12/11/2023   11:37 AM 11/26/2023    7:03 PM 11/23/2023    8:33 AM 09/19/2023    1:59 AM 06/20/2023    1:01 PM 06/16/2023    9:45 AM 03/22/2023    8:38 AM  Advanced Directives  Does Patient Have a Medical Advance Directive? Yes Yes Yes Yes No No Yes  Type of Estate agent of [place];Living will Healthcare Power of [employer] of [place];Living will    Healthcare Power of [place];Living will  Does patient want to make changes to medical advance directive? No - Patient declined No - Patient declined     No - Patient declined  Copy of Healthcare Power of Attorney in Chart? Yes - validated most recent copy scanned in chart (See row information) No - copy available, Physician notified     Yes - validated most recent copy scanned in chart (See row information)  Would patient like information on creating a medical advance directive?     No - Patient declined No - Patient declined     Current Medications (verified) Outpatient Encounter Medications as of 01/04/2024  Medication Sig   aspirin  81 MG chewable tablet Chew 1 tablet (81 mg total) by mouth daily.   atorvastatin  (LIPITOR ) 80 MG tablet Take 1 tablet (80 mg total) by mouth daily.   carvedilol  (COREG ) 3.125 MG tablet Take 3.125 mg by mouth 2 (two) times daily.   chlorthalidone  (HYGROTON ) 25 MG tablet Take 1 tablet (25 mg total) by mouth daily.   cholecalciferol (VITAMIN D3) 25 MCG (1000 UNIT) tablet Take 1,000 Units by mouth daily.   clopidogrel  (PLAVIX ) 75 MG tablet Take 1 tablet (75 mg total) by mouth daily.    docusate sodium  (COLACE) 100 MG capsule Take 100 mg by mouth 2 (two) times daily.   escitalopram  (LEXAPRO ) 10 MG tablet Take 1 tablet (10 mg total) by mouth daily.   ezetimibe  (ZETIA ) 10 MG tablet Take 1 tablet (10 mg total) by mouth daily.   ferrous sulfate 325 (65 FE) MG EC tablet Take 325 mg by mouth daily with breakfast.   Glucose 15 GM/32ML GEL Take 1 packet by mouth as needed (low blood sugar).   losartan  (COZAAR ) 100 MG tablet Take 1 tablet (100 mg total) by mouth daily.   magnesium  chloride (SLOW-MAG) 64 MG TBEC SR tablet Take 1 tablet by mouth daily.   Multiple Vitamin (THEREMS PO) Take 1 tablet by mouth daily.   nitroGLYCERIN  (NITROSTAT ) 0.4 MG SL tablet Place 0.4 mg under the tongue every 5 (five) minutes as needed for chest pain.   pantoprazole  (PROTONIX ) 40 MG tablet Take 40 mg by mouth daily.   tizanidine  (ZANAFLEX ) 2 MG capsule Take 1 capsule (2 mg total) by mouth 3 (three) times daily.   traMADol  (ULTRAM ) 50 MG tablet Take 50 mg by mouth every 6 (six) hours as needed.   No facility-administered encounter medications on file as of 01/04/2024.    Allergies (verified) Sulfa antibiotics   History: Past Medical History:  Diagnosis Date   Anginal pain (HCC)    Aortic atherosclerosis  Cancer [facility])    Carotid artery stenosis 02/01/2010   a.) Doppler 02/01/2010: 72% LICA and 60% RICA. b.) Doppler 12/30/2014: >70% LICA and 70% RICA. c.) Doppler 10/25/16: >70% Bilater ICAs   Chronic bilateral low back pain with left-sided sciatica    CKD (chronic kidney disease), stage III (HCC)    Coronary artery disease    a.) LHC 12/24/2014 -> small LAD system; diffuse CTO of LAD. 60% RCA. LM and LCx with no sig disease. no intervention; med mgmt.   Degenerative arthritis    Diastolic dysfunction    a.) TTE 09/20/2018: EF 55%, G1DD, mild LAE and RVE, triv-mild panvalvular regurgitation.   Dysarthria    GERD (gastroesophageal reflux disease)    Hepatic steatosis    History of kidney  stones    HLD (hyperlipidemia)    Hypertension    Kidney stone    Lumbar radiculopathy    Major depression in remission (HCC)    PVD (peripheral vascular disease) (HCC)    Renal cyst, right 06/02/2015   a.) CT 06/02/2015 --> 4.4 cm RIGHT renal mass; MRI recommended. b.) MRI 06/16/2015 --> 3.8 x 4.2 x 4.0 cm proteinaceous/hemmorhagic cyst.   Subclavian steal syndrome    a.) LHC 12/24/2014 --> tight eccentric 90% ostial stenosis with damping.   T2DM (type 2 diabetes mellitus) [facility])    Past Surgical History:  Procedure Laterality Date   ABDOMINAL HYSTERECTOMY     AUGMENTATION MAMMAPLASTY Bilateral 1980   BACK SURGERY  09/2018   BICEPT TENODESIS  06/10/2021   Procedure: BICEPS TENODESIS;  Surgeon: Elner Hahn, MD;  Location: [facility] ORS;  Service: Orthopedics;;   CARDIAC CATHETERIZATION Left 12/24/2014   Procedure: LEFT HEARD CATHETERIZATION; Location: [facility]; Surgeon: Dorean Gambles, MD   CHOLECYSTECTOMY     COLONOSCOPY N/A 02/11/2016   Procedure: COLONOSCOPY;  Surgeon: Cassie Click, MD;  [facility]COPY;  Service: Endoscopy;  Laterality: N/A;   IR CT HEAD LTD  11/16/2021   IR CT HEAD LTD  11/16/2021   IR INTRAVSC STENT CERV CAROTID W/O EMB-PROT MOD SED INC ANGIO  11/18/2021   IR PERCUTANEOUS ART THROMBECTOMY/INFUSION INTRACRANIAL INC DIAG ANGIO  11/16/2021   RADIOLOGY WITH ANESTHESIA N/A 11/16/2021   Procedure: IR WITH ANESTHESIA- CODE STROKE;  Surgeon: Radiologist, Medication, MD;  Location: [facility] OR;  Service: Radiology;  Laterality: N/A;   REVERSE SHOULDER ARTHROPLASTY Right 06/10/2021   Procedure: REVERSE SHOULDER ARTHROPLASTY;  Surgeon: Elner Hahn, MD;  Location: [facility] ORS;  Service: Orthopedics;  Laterality: Right;   Family History  Problem Relation Age of Onset   Heart attack Mother    Heart disease Mother    Heart attack Father    Heart disease Father    Breast cancer Neg Hx    Social History   Socioeconomic History   Marital status: Married    Spouse name: Not on  file   Number of children: Not on file   Years of education: Not on file   Highest education level: Not on file  Occupational History   Not on file  Tobacco Use   Smoking status: Never    Passive exposure: Never   Smokeless tobacco: Never  Vaping Use   Vaping status: Never Used  Substance and Sexual Activity   Alcohol use: Never   Drug use: Never   Sexual activity: Not Currently  Other Topics Concern   Not on file  Social History Narrative   Not on file   Social Drivers of Health  Financial Resource Strain: Not on file  Food Insecurity: No Food Insecurity (03/14/2023)   Hunger Vital Sign    Worried About Running Out of Food in the Last Year: Never true    Ran Out of Food in the Last Year: Never true  Transportation Needs: No Transportation Needs (03/14/2023)   PRAPARE - Administrator, Civil Service (Medical): No    Lack of Transportation (Non-Medical): No  Physical Activity: Not on file  Stress: Not on file  Social Connections: Not on file    Tobacco Counseling Counseling given: Not Answered   Clinical Intake:  Pre-visit preparation completed: Yes  Pain : No/denies pain     BMI - recorded: 28 Diabetes: Yes  How often do you need to have someone help you when you read instructions, pamphlets, or other written materials from your doctor or pharmacy?: 1 - Never         Activities of Daily Living    01/04/2024    8:45 AM 03/14/2023   12:08 PM  In your present state of health, do you have any difficulty performing the following activities:  Hearing? 0 0  Vision? 0 0  Difficulty concentrating or making decisions? 0 1  Walking or climbing stairs? 1 1  Dressing or bathing? 1 1  Doing errands, shopping? 1 1  Preparing Food and eating ? N   Using the Toilet? N   In the past six months, have you accidently leaked urine? N   Do you have problems with loss of bowel control? N   Managing your Medications? Y   Managing your Finances? Y    Housekeeping or managing your Housekeeping? Y     Patient Care Team: Valrie Gehrig, MD as PCP - General (Family Medicine) Valrie Gehrig, MD (Family Medicine)  Indicate any recent Medical Services you may have received from other than [facility] providers in the past year (date may be approximate).     Assessment:    This is a routine wellness examination for Chairty.  Hearing/Vision screen No results found.   Goals Addressed   None    Depression Screen    01/04/2024    8:58 AM 10/17/2019    1:35 PM 07/03/2018    8:41 AM 05/14/2018    3:50 PM 03/27/2018    2:36 PM 02/07/2018    3:05 PM 01/09/2018    2:20 PM  PHQ 2/9 Scores  PHQ - 2 Score 0 0 0 0 0 0 0    Fall Risk    01/04/2024    8:58 AM 10/17/2019    1:35 PM 07/03/2018    8:41 AM 05/14/2018    3:50 PM 03/27/2018    2:36 PM  Fall Risk   Falls in the past year? 1 0 0 No No  Number falls in past yr: 1  0    Injury with Fall? 1  0    Risk for fall due to : History of fall(s);Impaired balance/gait;Impaired mobility      Follow up Falls evaluation completed        MEDICARE RISK AT HOME: Medicare Risk at Home Any stairs in or around the home?: No Home free of loose throw rugs in walkways, pet beds, electrical cords, etc?: Yes Adequate lighting in your home to reduce risk of falls?: Yes Life alert?: Yes Use of a cane, walker or w/c?: Yes Grab bars in the bathroom?: Yes Shower chair or bench in shower?: Yes Elevated toilet seat or a handicapped  toilet?: Yes  TIMED UP AND GO:  Was the test performed?  No    Cognitive Function:        Immunizations Immunization History  Administered Date(s) Administered   Influenza Inj Mdck Quad Pf 05/02/2018   Influenza Split 05/26/2015   Influenza, High Dose Seasonal PF 05/16/2019, 05/10/2020, 05/25/2021   Influenza-Unspecified 05/01/2012, 05/12/2013, 06/08/2016, 04/28/2017, 05/26/2023   Moderna Covid-19 Vaccine Bivalent Booster 51yrs & up 05/25/2021   PFIZER Comirnaty(Gray  Top)Covid-19 Tri-Sucrose Vaccine 08/15/2019, 09/07/2019, 05/10/2020   PFIZER(Purple Top)SARS-COV-2 Vaccination 08/15/2019, 09/07/2019, 05/02/2020, 11/25/2020, 05/05/2023   Pneumococcal Conjugate-13 05/26/2015   Pneumococcal Polysaccharide-23 08/09/2011   Tdap 09/19/2023   Zoster Recombinant(Shingrix) 08/30/2018, 01/03/2019   Zoster, Live 08/09/2011    TDAP status: Up to date  Flu Vaccine status: Up to date  Pneumococcal vaccine status: Up to date  Covid-19 vaccine status: Information provided on how to obtain vaccines.   Qualifies for Shingles Vaccine? Yes   Zostavax completed No   Shingrix Completed?: Yes  Screening Tests Health Maintenance  Topic Date Due   OPHTHALMOLOGY EXAM  Never done   Diabetic kidney evaluation - Urine ACR  Never done   DEXA SCAN  Never done   COVID-19 Vaccine (10 - 2024-25 season) 06/30/2023   HEMOGLOBIN A1C  09/13/2023   INFLUENZA VACCINE  03/08/2024   Diabetic kidney evaluation - eGFR measurement  11/29/2024   FOOT EXAM  01/03/2025   Medicare Annual Wellness (AWV)  01/03/2025   DTaP/Tdap/Td (2 - Td or Tdap) 09/18/2033   Pneumonia Vaccine 41+ Years old  Completed   Zoster Vaccines- Shingrix  Completed   HPV VACCINES  Aged Out   Meningococcal B Vaccine  Aged Out    Health Maintenance  Health Maintenance Due  Topic Date Due   OPHTHALMOLOGY EXAM  Never done   Diabetic kidney evaluation - Urine ACR  Never done   DEXA SCAN  Never done   COVID-19 Vaccine (10 - 2024-25 season) 06/30/2023   HEMOGLOBIN A1C  09/13/2023    Colorectal cancer screening: No longer required.   Mammogram status: No longer required due to age.  Bone Density status: Ordered today. Pt provided with contact info and advised to call to schedule appt.  Lung Cancer Screening: (Low Dose CT Chest recommended if Age 102-80 years, 20 pack-year currently smoking OR have quit w/in 15years.) does not qualify.   Lung Cancer Screening Referral: na  Additional  Screening:  Hepatitis C Screening: does not qualify; Completed na  Vision Screening: Recommended annual ophthalmology exams for early detection of glaucoma and other disorders of the eye. Is the patient up to date with their annual eye exam?  Yes  Who is the provider or what is the name of the office in which the patient attends annual eye exams? [facility] If pt is not established with a provider, would they like to be referred to a provider to establish care? No .   Dental Screening: Recommended annual dental exams for proper oral hygiene  Diabetic Foot Exam: Diabetic Foot Exam: Completed today  Community Resource Referral / Chronic Care Management: CRR required this visit?  No   CCM required this visit?  No     Plan:     I have personally reviewed and noted the following in the patient's chart:   Medical and social history Use of alcohol, tobacco or illicit drugs  Current medications and supplements including opioid prescriptions. Patient is not currently taking opioid prescriptions. Functional ability and status Nutritional status Physical  activity Advanced directives List of other physicians Hospitalizations, surgeries, and ER visits in previous 12 months Vitals Screenings to include cognitive, depression, and falls Referrals and appointments  In addition, I have reviewed and discussed with patient certain preventive protocols, quality metrics, and best practice recommendations. A written personalized care plan for preventive services as well as general preventive health recommendations were provided to patient.     Verma Gobble, NP   01/04/2024

## 2024-01-04 NOTE — Telephone Encounter (Signed)
 Requested Eye Exam from [facility] as requested by [facility].

## 2024-01-04 NOTE — Patient Instructions (Signed)
  Ms. Iannello , Thank you for taking time to come for your Medicare Wellness Visit. I appreciate your ongoing commitment to your health goals. Please review the following plan we discussed and let me know if I can assist you in the future.  Will request records for eye exam and order bone denisty   This is a list of the screening recommended for you and due dates:  Health Maintenance  Topic Date Due   Eye exam for diabetics  Never done   Yearly kidney health urinalysis for diabetes  Never done   DEXA scan (bone density measurement)  Never done   COVID-19 Vaccine (10 - 2024-25 season) 06/30/2023   Hemoglobin A1C  09/13/2023   Flu Shot  03/08/2024   Yearly kidney function blood test for diabetes  11/29/2024   Complete foot exam   01/03/2025   Medicare Annual Wellness Visit  01/03/2025   DTaP/Tdap/Td vaccine (2 - Td or Tdap) 09/18/2033   Pneumonia Vaccine  Completed   Zoster (Shingles) Vaccine  Completed   HPV Vaccine  Aged Out   Meningitis B Vaccine  Aged Out

## 2024-01-10 ENCOUNTER — Ambulatory Visit
Admission: RE | Admit: 2024-01-10 | Discharge: 2024-01-10 | Disposition: A | Discharge status: Home / Self Care | Source: Ambulatory Visit | Attending: Nurse Practitioner | Admitting: Nurse Practitioner

## 2024-01-10 DIAGNOSIS — M85851 Other specified disorders of bone density and structure, right thigh: Secondary | ICD-10-CM | POA: Diagnosis not present

## 2024-01-10 DIAGNOSIS — E2839 Other primary ovarian failure: Secondary | ICD-10-CM | POA: Insufficient documentation

## 2024-01-10 DIAGNOSIS — Z780 Asymptomatic menopausal state: Secondary | ICD-10-CM | POA: Diagnosis not present

## 2024-01-15 ENCOUNTER — Ambulatory Visit: Payer: Self-pay | Admitting: Student

## 2024-01-24 DIAGNOSIS — B351 Tinea unguium: Secondary | ICD-10-CM | POA: Diagnosis not present

## 2024-01-24 DIAGNOSIS — E1159 Type 2 diabetes mellitus with other circulatory complications: Secondary | ICD-10-CM | POA: Diagnosis not present

## 2024-01-31 ENCOUNTER — Non-Acute Institutional Stay (SKILLED_NURSING_FACILITY): Payer: Self-pay | Admitting: Student

## 2024-01-31 ENCOUNTER — Encounter: Payer: Self-pay | Admitting: Student

## 2024-01-31 DIAGNOSIS — M79671 Pain in right foot: Secondary | ICD-10-CM | POA: Diagnosis not present

## 2024-01-31 NOTE — Progress Notes (Signed)
 Location:  Other [place].  Nursing Home Room Number: [facility] [id number] Place of Service:  SNF [phone]) Provider:  Abdul Fine, MD  [name], MD as PCP - General (Family Medicine) Abdul Fine, MD [facility] Medicine)  Extended Emergency Contact Information Primary Emergency Contact: [name], [name]: [phone] Relation: Daughter Secondary Emergency Contact: [name]: [phone] Relation: Sister  Code Status:  Full Code Goals of care: Advanced Directive information    12/11/2023   11:37 AM  Advanced Directives  Does Patient Have a Medical Advance Directive? Yes  Type of Estate agent of [place];Living will  Does patient want to make changes to medical advance directive? No - Patient declined  Copy of Healthcare Power of Attorney in Chart? Yes - validated most recent copy scanned in chart (See row information)     Chief Complaint  Patient presents with   Foot Pain    Foot Pain    HPI:  Pt is a 83 y.o. female seen today for an acute visit for Foot Pain.  The right foot has been hurting for a few weeks. She doesn't know why it is hurting so much. No pain medication has helped. No recent falls for fracture. This is the foot she wears an orthotic.   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 b.) Doppler 12/30/2014: >70% LICA and 70% RICA. c.) Doppler 10/25/16: >70% Bilater ICAs   Chronic bilateral low back pain with left-sided sciatica    CKD (chronic kidney disease), stage III (HCC)    Coronary artery disease    a.) LHC 12/24/2014 -> small LAD system; diffuse CTO of LAD. 60% RCA. LM and LCx with no sig disease. no intervention; med mgmt.   Degenerative arthritis    Diastolic  dysfunction    a.) TTE 09/20/2018: EF 55%, G1DD, mild LAE and RVE, triv-mild panvalvular regurgitation.   Dysarthria    GERD (gastroesophageal reflux disease)    Hepatic steatosis    History of kidney stones    HLD (hyperlipidemia)    Hypertension    Kidney stone    Lumbar radiculopathy    Major depression in remission (HCC)    PVD (peripheral vascular disease) (HCC)    Renal cyst, right 06/02/2015   a.) CT 06/02/2015 --> 4.4 cm RIGHT renal mass; MRI recommended. b.) MRI 06/16/2015 --> 3.8 x 4.2 x 4.0 cm proteinaceous/hemmorhagic cyst.   Subclavian steal syndrome    a.) LHC 12/24/2014 --> tight eccentric 90% ostial stenosis with damping.   T2DM (type 2 diabetes mellitus) [facility])    Past Surgical History:  Procedure Laterality Date   ABDOMINAL HYSTERECTOMY     AUGMENTATION MAMMAPLASTY Bilateral 1980   BACK SURGERY  09/2018   BICEPT TENODESIS  06/10/2021   Procedure: BICEPS TENODESIS;  Surgeon: Edie Norleen PARAS, MD;  Location: [facility] ORS;  Service: Orthopedics;;   CARDIAC CATHETERIZATION Left 12/24/2014   Procedure: LEFT HEARD CATHETERIZATION; Location: [facility]; Surgeon: Norleen Molt, MD   CHOLECYSTECTOMY     COLONOSCOPY N/A 02/11/2016   Procedure: COLONOSCOPY;  Surgeon: Lamar ONEIDA Holmes, MD;  Location: [facility] ENDOSCOPY;  Service: Endoscopy;  Laterality: N/A;   IR CT HEAD LTD  11/16/2021   IR CT HEAD  LTD  11/16/2021   IR INTRAVSC STENT CERV CAROTID W/O EMB-PROT MOD SED INC ANGIO  11/18/2021   IR PERCUTANEOUS ART THROMBECTOMY/INFUSION INTRACRANIAL INC DIAG ANGIO  11/16/2021   RADIOLOGY WITH ANESTHESIA N/A 11/16/2021   Procedure: IR WITH ANESTHESIA- CODE STROKE;  Surgeon: Radiologist, Medication, MD;  Location: [facility] OR;  Service: Radiology;  Laterality: N/A;   REVERSE SHOULDER ARTHROPLASTY Right 06/10/2021   Procedure: REVERSE SHOULDER ARTHROPLASTY;  Surgeon: Edie Norleen PARAS, MD;  Location: [facility] ORS;  Service: Orthopedics;  Laterality: Right;    Allergies  Allergen Reactions   Sulfa Antibiotics Other  (See Comments)    Unknown reaction    Outpatient Encounter Medications as of 01/31/2024  Medication Sig   aspirin  81 MG chewable tablet Chew 1 tablet (81 mg total) by mouth daily.   atorvastatin  (LIPITOR ) 80 MG tablet Take 1 tablet (80 mg total) by mouth daily.   carvedilol  (COREG ) 3.125 MG tablet Take 3.125 mg by mouth 2 (two) times daily.   chlorthalidone  (HYGROTON ) 25 MG tablet Take 1 tablet (25 mg total) by mouth daily.   cholecalciferol (VITAMIN D3) 25 MCG (1000 UNIT) tablet Take 1,000 Units by mouth daily.   clopidogrel  (PLAVIX ) 75 MG tablet Take 1 tablet (75 mg total) by mouth daily.   docusate sodium  (COLACE) 100 MG capsule Take 100 mg by mouth 2 (two) times daily.   escitalopram  (LEXAPRO ) 10 MG tablet Take 1 tablet (10 mg total) by mouth daily.   ezetimibe  (ZETIA ) 10 MG tablet Take 1 tablet (10 mg total) by mouth daily.   ferrous sulfate 325 (65 FE) MG EC tablet Take 325 mg by mouth daily with breakfast.   losartan  (COZAAR ) 100 MG tablet Take 1 tablet (100 mg total) by mouth daily.   magnesium  chloride (SLOW-MAG) 64 MG TBEC SR tablet Take 1 tablet by mouth daily.   Multiple Vitamin (THEREMS PO) Take 1 tablet by mouth daily.   nitroGLYCERIN  (NITROSTAT ) 0.4 MG SL tablet Place 0.4 mg under the tongue every 5 (five) minutes as needed for chest pain.   pantoprazole  (PROTONIX ) 40 MG tablet Take 40 mg by mouth daily.   tizanidine  (ZANAFLEX ) 2 MG capsule Take 1 capsule (2 mg total) by mouth 3 (three) times daily.   traMADol  (ULTRAM ) 50 MG tablet Take 50 mg by mouth every 6 (six) hours as needed.   Glucose 15 GM/32ML GEL Take 1 packet by mouth as needed (low blood sugar). (Patient not taking: Reported on 01/31/2024)   No facility-administered encounter medications on file as of 01/31/2024.    Review of Systems  Immunization History  Administered Date(s) Administered   Influenza Inj Mdck Quad Pf 05/02/2018   Influenza Split 05/26/2015   Influenza, High Dose Seasonal PF 05/16/2019,  05/10/2020, 05/25/2021   Influenza-Unspecified 05/01/2012, 05/12/2013, 06/08/2016, 04/28/2017, 05/26/2023   Moderna Covid-19 Vaccine Bivalent Booster 47yrs & up 05/25/2021   PFIZER Comirnaty(Gray Top)Covid-19 Tri-Sucrose Vaccine 08/15/2019, 09/07/2019, 05/10/2020   PFIZER(Purple Top)SARS-COV-2 Vaccination 08/15/2019, 09/07/2019, 05/02/2020, 11/25/2020, 05/05/2023   Pneumococcal Conjugate-13 05/26/2015   Pneumococcal Polysaccharide-23 08/09/2011   Tdap 09/19/2023   Zoster Recombinant(Shingrix) 08/30/2018, 01/03/2019   Zoster, Live 08/09/2011   Pertinent  Health Maintenance Due  Topic Date Due   OPHTHALMOLOGY EXAM  Never done   INFLUENZA VACCINE  03/08/2024   HEMOGLOBIN A1C  07/06/2024   FOOT EXAM  01/03/2025   DEXA SCAN  Completed      11/23/2021    8:00 PM 11/24/2021    9:00 AM 11/24/2021    8:10 PM  11/25/2021    8:00 AM 01/04/2024    8:58 AM  Fall Risk  Falls in the past year?     1  Was there an injury with Fall?     1  Fall Risk Category Calculator     3  (RETIRED) Patient Fall Risk Level Moderate fall risk  Moderate fall risk  Moderate fall risk  Moderate fall risk    Patient at Risk for Falls Due to     History of fall(s);Impaired balance/gait;Impaired mobility  Fall risk Follow up     Falls evaluation completed     Data saved with a previous flowsheet row definition   Functional Status Survey:    Vitals:   01/31/24 1047  BP: (!) 158/81  Pulse: 80  Resp: 18  Temp: (!) 97.3 F (36.3 C)  SpO2: 90%  Weight: 154 lb 9.6 oz (70.1 kg)  Height: 5' 2 (1.575 m)   Body mass index is 28.28 kg/m. Physical Exam Musculoskeletal:     Comments: Right foot with limited ROM. No bruising or swelling. Right lateral foot callus     Labs reviewed: Recent Labs    03/13/23 1034 11/23/23 0837 11/26/23 1906 11/30/23 0000  NA 140 136 134* 136*  K 3.5 3.4* 3.5 3.8  CL 104 100 95* 99  CO2  --  27 27 27*  GLUCOSE 103* 121* 138*  --   BUN 16 23 26* 25*  CREATININE 1.10* 1.20*  1.22* 1.0  CALCIUM   --  9.2 9.8 9.5   Recent Labs    03/13/23 1029 11/23/23 0837  AST 29 25  ALT 26 29  ALKPHOS 51 50  BILITOT 0.7 0.7  PROT 6.9 6.8  ALBUMIN 3.6 3.7   Recent Labs    03/13/23 1029 03/13/23 1034 11/23/23 0837 11/26/23 1906 11/30/23 0000 12/22/23 0000  WBC 7.2  --  9.4 10.8* 10.3 9.3  NEUTROABS 4.6  --  6.2 7.1 6,211.00 5,534.00  HGB 13.4   < > 13.1 13.6 11.8* 13.2  HCT 41.2   < > 39.1 39.5 35* 40  MCV 94.7  --  97.3 95.2  --   --   PLT 256  --  274 292 262 315   < > = values in this interval not displayed.   No results found for: TSH Lab Results  Component Value Date   HGBA1C 6.1 01/04/2024   Lab Results  Component Value Date   CHOL 112 03/14/2023   HDL 48 03/14/2023   LDLCALC 41 03/14/2023   TRIG 117 03/14/2023   CHOLHDL 2.3 03/14/2023    Significant Diagnostic Results in last 30 days:  DG Bone Density Result Date: 01/10/2024 EXAM: DUAL X-RAY ABSORPTIOMETRY (DXA) FOR BONE MINERAL DENSITY 01/10/2024 1:41 pm CLINICAL DATA:  83 year old Female Postmenopausal. Estrogen deficiency TECHNIQUE: An axial (e.g., hips, spine) and/or appendicular (e.g., radius) exam was performed, as appropriate, using GE Secretary/administrator at [facility]. Images are obtained for bone mineral density measurement and are not obtained for diagnostic purposes. [id number] Exclusions: L3-L4 due to degenerative changes COMPARISON:  None. FINDINGS: Scan quality: Good. LUMBAR SPINE (L1-L2): BMD (in g/cm2): 1.523 T-score: 2.9 Z-score: 4.8 LEFT FEMORAL NECK: BMD (in g/cm2): 0.989 T-score: -0.4 Z-score: 2.0 LEFT TOTAL HIP: BMD (in g/cm2): 1.014 T-score: 0.0 Z-score: 2.3 RIGHT FEMORAL NECK: BMD (in g/cm2): 0.864 T-score: -1.3 Z-score: 1.1 RIGHT TOTAL HIP: BMD (in g/cm2): 0.969 T-score: -0.3 Z-score: 2.0 FRAX 10-YEAR PROBABILITY OF FRACTURE: 10-year fracture risk  is performed using the [facility] calculator based on patient-reported risk factors. Major  osteoporotic fracture: 12.6% Hip fracture: 3.1% Other situations known to alter the reliability of the FRAX score should be considered when making treatment decisions, including chronic glucocorticoid use and past treatments. Further guidance on treatment can be found at the [facility]oporosis Foundation's website https://www.patton.com/. IMPRESSION: Osteopenia based on BMD. Fracture risk is increased. Increased risk is based on low BMD, and FRAX calculation. RECOMMENDATIONS: 1. All patients should optimize calcium  and vitamin D  intake. 2. Consider FDA-approved medical therapies in postmenopausal women and men aged 37 years and older, based on the following: - A hip or vertebral (clinical or morphometric) fracture - T-score less than or equal to -2.5 and secondary causes have been excluded. - Low bone mass (T-score between -1.0 and -2.5) and a 10-year probability of a hip fracture greater than or equal to 3% or a 10-year probability of a major osteoporosis-related fracture greater than or equal to 20% based on the [place] -adapted WHO algorithm. - Clinician judgment and/or patient preferences may indicate treatment for people with 10-year fracture probabilities above or below these levels 3. Patients with diagnosis of osteoporosis or at high risk for fracture should have regular bone mineral density tests. For patients eligible for Medicare, routine testing is allowed once every 2 years. The testing frequency can be increased to one year for patients who have rapidly progressing disease, those who are receiving or discontinuing medical therapy to restore bone mass, or have additional risk factors. Electronically Signed   By: Reyes Phi M.D.   On: 01/10/2024 16:56    Assessment/Plan Right foot pain Foot with orthotic pain x-ray to evaluate for fx. Encourage patient to have an appointment with OT and podiatry for fiitting.   Family/ staff Communication: nursing  Labs/tests ordered:  non

## 2024-02-06 DIAGNOSIS — Z9181 History of falling: Secondary | ICD-10-CM | POA: Diagnosis not present

## 2024-02-06 DIAGNOSIS — M21371 Foot drop, right foot: Secondary | ICD-10-CM | POA: Diagnosis not present

## 2024-02-06 DIAGNOSIS — G4733 Obstructive sleep apnea (adult) (pediatric): Secondary | ICD-10-CM | POA: Diagnosis not present

## 2024-02-06 DIAGNOSIS — R252 Cramp and spasm: Secondary | ICD-10-CM | POA: Diagnosis not present

## 2024-02-06 DIAGNOSIS — I63312 Cerebral infarction due to thrombosis of left middle cerebral artery: Secondary | ICD-10-CM | POA: Diagnosis not present

## 2024-02-08 ENCOUNTER — Non-Acute Institutional Stay (SKILLED_NURSING_FACILITY): Payer: Self-pay | Admitting: Nurse Practitioner

## 2024-02-08 ENCOUNTER — Encounter: Payer: Self-pay | Admitting: Nurse Practitioner

## 2024-02-08 DIAGNOSIS — I2584 Coronary atherosclerosis due to calcified coronary lesion: Secondary | ICD-10-CM | POA: Diagnosis not present

## 2024-02-08 DIAGNOSIS — F0390 Unspecified dementia without behavioral disturbance: Secondary | ICD-10-CM | POA: Diagnosis not present

## 2024-02-08 DIAGNOSIS — I2510 Atherosclerotic heart disease of native coronary artery without angina pectoris: Secondary | ICD-10-CM

## 2024-02-08 DIAGNOSIS — K219 Gastro-esophageal reflux disease without esophagitis: Secondary | ICD-10-CM

## 2024-02-08 DIAGNOSIS — N183 Chronic kidney disease, stage 3 unspecified: Secondary | ICD-10-CM | POA: Diagnosis not present

## 2024-02-08 DIAGNOSIS — E1122 Type 2 diabetes mellitus with diabetic chronic kidney disease: Secondary | ICD-10-CM

## 2024-02-08 DIAGNOSIS — F321 Major depressive disorder, single episode, moderate: Secondary | ICD-10-CM

## 2024-02-08 DIAGNOSIS — I5032 Chronic diastolic (congestive) heart failure: Secondary | ICD-10-CM

## 2024-02-08 DIAGNOSIS — M8580 Other specified disorders of bone density and structure, unspecified site: Secondary | ICD-10-CM | POA: Diagnosis not present

## 2024-02-08 DIAGNOSIS — G8191 Hemiplegia, unspecified affecting right dominant side: Secondary | ICD-10-CM | POA: Diagnosis not present

## 2024-02-08 NOTE — Assessment & Plan Note (Signed)
 Continues on vit d supplement

## 2024-02-08 NOTE — Assessment & Plan Note (Signed)
 Stable without chest pains at [facility] stime, continues on ASA with coreg , losartan  and statin

## 2024-02-08 NOTE — Assessment & Plan Note (Signed)
 Euvolemic at this time, continues on losartan  and coreg 

## 2024-02-08 NOTE — Assessment & Plan Note (Signed)
 Ongoing, continues therapy and orthotics

## 2024-02-08 NOTE — Assessment & Plan Note (Signed)
 Continue protonix

## 2024-02-08 NOTE — Progress Notes (Signed)
 Location:  Other [place].  Nursing Home Room Number: [facility] [id number] Place of Service:  SNF [phone]) [name], NP  PCP: Abdul Fine, MD  [name], MD as PCP - General (Family Medicine) Abdul Fine, MD [facility] Medicine)  Extended Emergency Contact Information Primary Emergency Contact: [facility] Address: [name], [name]: [phone] Relation: Daughter Secondary Emergency Contact: [facility] Mobile Phone: [phone] Relation: Sister  Goals of care: Advanced Directive information    12/11/2023   11:37 AM  Advanced Directives  Does Patient Have a Medical Advance Directive? Yes  Type of Estate agent of [place];Living will  Does patient want to make changes to medical advance directive? No - Patient declined  Copy of Healthcare Power of Attorney in Chart? Yes - validated most recent copy scanned in chart (See row information)     Chief Complaint  Patient presents with   Medical Management of Chronic Issues    Medical Management of Chronic Issues.     HPI:  Pt is a 83 y.o. female seen today for medical management of chronic disease.  Pt with hx of right sided hemiparesis, CAD, CKD, DM, OA.  Pt continues to work with therapy to help with mobility, continues to be bothered by right foot pain with walking but is not constant.  Mood has been stable on lexapro  Denies chest pains or shortness of breath.  Continues on protonix  for GERD, without worsening of symptoms.  Staff has no acute concerns.  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 b.) Doppler 12/30/2014: >70% LICA and 70% RICA. c.) Doppler 10/25/16: >70% Bilater ICAs   Chronic bilateral low back pain with left-sided sciatica    CKD (chronic  kidney disease), stage III (HCC)    Coronary artery disease    a.) LHC 12/24/2014 -> small LAD system; diffuse CTO of LAD. 60% RCA. LM and LCx with no sig disease. no intervention; med mgmt.   Degenerative arthritis    Diastolic dysfunction    a.) TTE 09/20/2018: EF 55%, G1DD, mild LAE and RVE, triv-mild panvalvular regurgitation.   Dysarthria    GERD (gastroesophageal reflux disease)    Hepatic steatosis    History of kidney stones    HLD (hyperlipidemia)    Hypertension    Kidney stone    Lumbar radiculopathy    Major depression in remission (HCC)    PVD (peripheral vascular disease) (HCC)    Renal cyst, right 06/02/2015   a.) CT 06/02/2015 --> 4.4 cm RIGHT renal mass; MRI recommended. b.) MRI 06/16/2015 --> 3.8 x 4.2 x 4.0 cm proteinaceous/hemmorhagic cyst.   Subclavian steal syndrome    a.) LHC 12/24/2014 --> tight eccentric 90% ostial stenosis with damping.   T2DM (type 2 diabetes mellitus) [facility])    Past Surgical History:  Procedure Laterality Date   ABDOMINAL HYSTERECTOMY     AUGMENTATION MAMMAPLASTY Bilateral 1980   BACK SURGERY  09/2018   BICEPT TENODESIS  06/10/2021   Procedure: BICEPS TENODESIS;  Surgeon: Edie Norleen PARAS, MD;  Location: [facility] ORS;  Service: Orthopedics;;   CARDIAC CATHETERIZATION Left 12/24/2014   Procedure: LEFT HEARD CATHETERIZATION; Location: [facility]; Surgeon: Norleen Molt, MD   CHOLECYSTECTOMY     COLONOSCOPY N/A 02/11/2016  Procedure: COLONOSCOPY;  Surgeon: Lamar ONEIDA Holmes, MD;  Location: [facility] ENDOSCOPY;  Service: Endoscopy;  Laterality: N/A;   IR CT HEAD LTD  11/16/2021   IR CT HEAD LTD  11/16/2021   IR INTRAVSC STENT CERV CAROTID W/O EMB-PROT MOD SED INC ANGIO  11/18/2021   IR PERCUTANEOUS ART THROMBECTOMY/INFUSION INTRACRANIAL INC DIAG ANGIO  11/16/2021   RADIOLOGY WITH ANESTHESIA N/A 11/16/2021   Procedure: IR WITH ANESTHESIA- CODE STROKE;  Surgeon: Radiologist, Medication, MD;  Location: [facility] OR;  Service: Radiology;  Laterality: N/A;   REVERSE SHOULDER  ARTHROPLASTY Right 06/10/2021   Procedure: REVERSE SHOULDER ARTHROPLASTY;  Surgeon: Edie Norleen PARAS, MD;  Location: [facility] ORS;  Service: Orthopedics;  Laterality: Right;    Allergies  Allergen Reactions   Sulfa Antibiotics Other (See Comments)    Unknown reaction    Outpatient Encounter Medications as of 02/08/2024  Medication Sig   aspirin  81 MG chewable tablet Chew 1 tablet (81 mg total) by mouth daily.   atorvastatin  (LIPITOR ) 80 MG tablet Take 1 tablet (80 mg total) by mouth daily.   carvedilol  (COREG ) 3.125 MG tablet Take 3.125 mg by mouth 2 (two) times daily.   chlorthalidone  (HYGROTON ) 25 MG tablet Take 1 tablet (25 mg total) by mouth daily.   cholecalciferol (VITAMIN D3) 25 MCG (1000 UNIT) tablet Take 1,000 Units by mouth daily.   clopidogrel  (PLAVIX ) 75 MG tablet Take 1 tablet (75 mg total) by mouth daily.   docusate sodium  (COLACE) 100 MG capsule Take 100 mg by mouth 2 (two) times daily.   escitalopram  (LEXAPRO ) 10 MG tablet Take 1 tablet (10 mg total) by mouth daily.   ezetimibe  (ZETIA ) 10 MG tablet Take 1 tablet (10 mg total) by mouth daily.   ferrous sulfate 325 (65 FE) MG EC tablet Take 325 mg by mouth daily with breakfast.   losartan  (COZAAR ) 100 MG tablet Take 1 tablet (100 mg total) by mouth daily.   magnesium  chloride (SLOW-MAG) 64 MG TBEC SR tablet Take 1 tablet by mouth daily.   Multiple Vitamin (THEREMS PO) Take 1 tablet by mouth daily.   nitroGLYCERIN  (NITROSTAT ) 0.4 MG SL tablet Place 0.4 mg under the tongue every 5 (five) minutes as needed for chest pain.   pantoprazole  (PROTONIX ) 40 MG tablet Take 40 mg by mouth daily.   ticagrelor  (BRILINTA ) 90 MG TABS tablet Take 90 mg by mouth 2 (two) times daily.   tizanidine  (ZANAFLEX ) 2 MG capsule Take 1 capsule (2 mg total) by mouth 3 (three) times daily.   traMADol  (ULTRAM ) 50 MG tablet Take 50 mg by mouth every 6 (six) hours as needed.   [DISCONTINUED] Glucose 15 GM/32ML GEL Take 1 packet by mouth as needed (low blood sugar).  (Patient not taking: Reported on 02/08/2024)   No facility-administered encounter medications on file as of 02/08/2024.    Review of Systems  Constitutional:  Negative for activity change, appetite change, fatigue and unexpected weight change.  HENT:  Negative for congestion and hearing loss.   Eyes: Negative.   Respiratory:  Negative for cough and shortness of breath.   Cardiovascular:  Negative for chest pain, palpitations and leg swelling.  Gastrointestinal:  Negative for abdominal pain, constipation and diarrhea.  Genitourinary:  Negative for difficulty urinating and dysuria.  Musculoskeletal:  Positive for arthralgias. Negative for myalgias.  Skin:  Negative for color change and wound.  Neurological:  Negative for dizziness and weakness.  Psychiatric/Behavioral:  Negative for agitation, behavioral problems and confusion.  Immunization History  Administered Date(s) Administered   Influenza Inj Mdck Quad Pf 05/02/2018   Influenza Split 05/26/2015   Influenza, High Dose Seasonal PF 05/16/2019, 05/10/2020, 05/25/2021   Influenza-Unspecified 05/01/2012, 05/12/2013, 06/08/2016, 04/28/2017, 05/26/2023   Moderna Covid-19 Vaccine Bivalent Booster 75yrs & up 05/25/2021   PFIZER Comirnaty(Gray Top)Covid-19 Tri-Sucrose Vaccine 08/15/2019, 09/07/2019, 05/10/2020   PFIZER(Purple Top)SARS-COV-2 Vaccination 08/15/2019, 09/07/2019, 05/02/2020, 11/25/2020, 05/05/2023   Pneumococcal Conjugate-13 05/26/2015   Pneumococcal Polysaccharide-23 08/09/2011   Tdap 09/19/2023   Zoster Recombinant(Shingrix) 08/30/2018, 01/03/2019   Zoster, Live 08/09/2011   Pertinent  Health Maintenance Due  Topic Date Due   OPHTHALMOLOGY EXAM  Never done   INFLUENZA VACCINE  03/08/2024   HEMOGLOBIN A1C  07/06/2024   FOOT EXAM  01/03/2025   DEXA SCAN  Completed      11/23/2021    8:00 PM 11/24/2021    9:00 AM 11/24/2021    8:10 PM 11/25/2021    8:00 AM 01/04/2024    8:58 AM  Fall Risk  Falls in the past year?      1  Was there an injury with Fall?     1  Fall Risk Category Calculator     3  (RETIRED) Patient Fall Risk Level Moderate fall risk  Moderate fall risk  Moderate fall risk  Moderate fall risk    Patient at Risk for Falls Due to     History of fall(s);Impaired balance/gait;Impaired mobility  Fall risk Follow up     Falls evaluation completed     Data saved with a previous flowsheet row definition   Functional Status Survey:    Vitals:   02/08/24 0825 02/08/24 0838  BP: (!) 144/80 130/80  Pulse: 80   Resp: 18   Temp: (!) 97.3 F (36.3 C)   SpO2: 90%   Weight: 162 lb 12.8 oz (73.8 kg)   Height: 5' 2 (1.575 m)    Body mass index is 29.78 kg/m. Physical Exam Constitutional:      General: She is not in acute distress.    Appearance: She is well-developed. She is not diaphoretic.  HENT:     Head: Normocephalic and atraumatic.     Mouth/Throat:     Pharynx: No oropharyngeal exudate.  Eyes:     Conjunctiva/sclera: Conjunctivae normal.     Pupils: Pupils are equal, round, and reactive to light.  Cardiovascular:     Rate and Rhythm: Normal rate and regular rhythm.     Heart sounds: Normal heart sounds.  Pulmonary:     Effort: Pulmonary effort is normal.     Breath sounds: Normal breath sounds.  Abdominal:     General: Bowel sounds are normal.     Palpations: Abdomen is soft.  Musculoskeletal:     Cervical back: Normal range of motion and neck supple.     Right lower leg: No edema.     Left lower leg: No edema.  Skin:    General: Skin is warm and dry.  Neurological:     Mental Status: She is alert.     Comments: Right sided weakness  Psychiatric:        Mood and Affect: Mood normal.     Labs reviewed: Recent Labs    03/13/23 1034 11/23/23 0837 11/26/23 1906 11/30/23 0000  NA 140 136 134* 136*  K 3.5 3.4* 3.5 3.8  CL 104 100 95* 99  CO2  --  27 27 27*  GLUCOSE 103* 121* 138*  --   BUN  16 23 26* 25*  CREATININE 1.10* 1.20* 1.22* 1.0  CALCIUM   --  9.2 9.8  9.5   Recent Labs    03/13/23 1029 11/23/23 0837  AST 29 25  ALT 26 29  ALKPHOS 51 50  BILITOT 0.7 0.7  PROT 6.9 6.8  ALBUMIN 3.6 3.7   Recent Labs    03/13/23 1029 03/13/23 1034 11/23/23 0837 11/26/23 1906 11/30/23 0000 12/22/23 0000  WBC 7.2  --  9.4 10.8* 10.3 9.3  NEUTROABS 4.6  --  6.2 7.1 6,211.00 5,534.00  HGB 13.4   < > 13.1 13.6 11.8* 13.2  HCT 41.2   < > 39.1 39.5 35* 40  MCV 94.7  --  97.3 95.2  --   --   PLT 256  --  274 292 262 315   < > = values in this interval not displayed.   No results found for: TSH Lab Results  Component Value Date   HGBA1C 6.1 01/04/2024   Lab Results  Component Value Date   CHOL 112 03/14/2023   HDL 48 03/14/2023   LDLCALC 41 03/14/2023   TRIG 117 03/14/2023   CHOLHDL 2.3 03/14/2023    Significant Diagnostic Results in last 30 days:  DG Bone Density Result Date: 01/10/2024 EXAM: DUAL X-RAY ABSORPTIOMETRY (DXA) FOR BONE MINERAL DENSITY 01/10/2024 1:41 pm CLINICAL DATA:  83 year old Female Postmenopausal. Estrogen deficiency TECHNIQUE: An axial (e.g., hips, spine) and/or appendicular (e.g., radius) exam was performed, as appropriate, using GE Secretary/administrator at [facility]. Images are obtained for bone mineral density measurement and are not obtained for diagnostic purposes. [id number] Exclusions: L3-L4 due to degenerative changes COMPARISON:  None. FINDINGS: Scan quality: Good. LUMBAR SPINE (L1-L2): BMD (in g/cm2): 1.523 T-score: 2.9 Z-score: 4.8 LEFT FEMORAL NECK: BMD (in g/cm2): 0.989 T-score: -0.4 Z-score: 2.0 LEFT TOTAL HIP: BMD (in g/cm2): 1.014 T-score: 0.0 Z-score: 2.3 RIGHT FEMORAL NECK: BMD (in g/cm2): 0.864 T-score: -1.3 Z-score: 1.1 RIGHT TOTAL HIP: BMD (in g/cm2): 0.969 T-score: -0.3 Z-score: 2.0 FRAX 10-YEAR PROBABILITY OF FRACTURE: 10-year fracture risk is performed using the [employer] FRAX calculator based on patient-reported risk factors. Major osteoporotic fracture: 12.6% Hip  fracture: 3.1% Other situations known to alter the reliability of the FRAX score should be considered when making treatment decisions, including chronic glucocorticoid use and past treatments. Further guidance on treatment can be found at the [facility] Foundation's website https://www.patton.com/. IMPRESSION: Osteopenia based on BMD. Fracture risk is increased. Increased risk is based on low BMD, and FRAX calculation. RECOMMENDATIONS: 1. All patients should optimize calcium  and vitamin D  intake. 2. Consider FDA-approved medical therapies in postmenopausal women and men aged 1 years and older, based on the following: - A hip or vertebral (clinical or morphometric) fracture - T-score less than or equal to -2.5 and secondary causes have been excluded. - Low bone mass (T-score between -1.0 and -2.5) and a 10-year probability of a hip fracture greater than or equal to 3% or a 10-year probability of a major osteoporosis-related fracture greater than or equal to 20% based on the [place] -adapted WHO algorithm. - Clinician judgment and/or patient preferences may indicate treatment for people with 10-year fracture probabilities above or below these levels 3. Patients with diagnosis of osteoporosis or at high risk for fracture should have regular bone mineral density tests. For patients eligible for Medicare, routine testing is allowed once every 2 years. The testing frequency can be increased to one year for patients who have rapidly  progressing disease, those who are receiving or discontinuing medical therapy to restore bone mass, or have additional risk factors. Electronically Signed   By: Reyes Phi M.D.   On: 01/10/2024 16:56    Assessment/Plan CAD (coronary artery disease) Stable without chest pains at [facility] stime, continues on ASA with coreg , losartan  and statin  Chronic diastolic (congestive) heart failure (HCC) Euvolemic at this time, continues on losartan  and coreg   Controlled type 2 diabetes mellitus with  stage 3 chronic kidney disease, without long-term current use of insulin  (HCC) Diet controlled, continue lifestyle modifications  Gastroesophageal reflux disease without esophagitis Continue protonix   Major depressive disorder, single episode, moderate (HCC) Stable at this time, continue to be involved in activities at facility and lexapro   Major neurocognitive disorder (HCC) Stable, continues with support from family and staff.   Osteopenia Continues on vit d supplement   Right hemiparesis (HCC) Ongoing, continues therapy and orthotics       Vann Okerlund K. Caro BODILY [facility] [phone]

## 2024-02-08 NOTE — Assessment & Plan Note (Signed)
 Stable, continues with support from family and staff.

## 2024-02-08 NOTE — Assessment & Plan Note (Signed)
 Stable at this time, continue to be involved in activities at facility and lexapro 

## 2024-02-08 NOTE — Assessment & Plan Note (Signed)
Diet controlled, continue lifestyle modifications

## 2024-02-09 ENCOUNTER — Encounter: Payer: Self-pay | Admitting: Student

## 2024-02-28 DIAGNOSIS — M79671 Pain in right foot: Secondary | ICD-10-CM | POA: Diagnosis not present

## 2024-03-13 DIAGNOSIS — Z961 Presence of intraocular lens: Secondary | ICD-10-CM | POA: Diagnosis not present

## 2024-04-04 DIAGNOSIS — E7800 Pure hypercholesterolemia, unspecified: Secondary | ICD-10-CM | POA: Diagnosis not present

## 2024-04-04 DIAGNOSIS — M1611 Unilateral primary osteoarthritis, right hip: Secondary | ICD-10-CM | POA: Diagnosis not present

## 2024-04-04 DIAGNOSIS — E1122 Type 2 diabetes mellitus with diabetic chronic kidney disease: Secondary | ICD-10-CM | POA: Diagnosis not present

## 2024-04-04 LAB — LIPID PANEL
Cholesterol: 135 (ref 0–200)
HDL: 56 (ref 35–70)
LDL Cholesterol: 60
Triglycerides: 106 (ref 40–160)

## 2024-04-22 DIAGNOSIS — E871 Hypo-osmolality and hyponatremia: Secondary | ICD-10-CM | POA: Diagnosis not present

## 2024-04-22 LAB — BASIC METABOLIC PANEL WITH GFR
BUN: 26 — AB (ref 4–21)
CO2: 29 — AB (ref 13–22)
Chloride: 101 (ref 99–108)
Creatinine: 1.2 — AB (ref 0.5–1.1)
Glucose: 105
Potassium: 4 meq/L (ref 3.5–5.1)
Sodium: 138 (ref 137–147)

## 2024-04-22 LAB — COMPREHENSIVE METABOLIC PANEL WITH GFR: Calcium: 9.3 (ref 8.7–10.7)

## 2024-05-06 ENCOUNTER — Encounter: Payer: Self-pay | Admitting: Adult Health

## 2024-05-06 ENCOUNTER — Non-Acute Institutional Stay (SKILLED_NURSING_FACILITY): Payer: Self-pay | Admitting: Adult Health

## 2024-05-06 DIAGNOSIS — D509 Iron deficiency anemia, unspecified: Secondary | ICD-10-CM | POA: Diagnosis not present

## 2024-05-06 DIAGNOSIS — I5032 Chronic diastolic (congestive) heart failure: Secondary | ICD-10-CM

## 2024-05-06 DIAGNOSIS — F321 Major depressive disorder, single episode, moderate: Secondary | ICD-10-CM

## 2024-05-06 DIAGNOSIS — I69359 Hemiplegia and hemiparesis following cerebral infarction affecting unspecified side: Secondary | ICD-10-CM

## 2024-05-06 DIAGNOSIS — I129 Hypertensive chronic kidney disease with stage 1 through stage 4 chronic kidney disease, or unspecified chronic kidney disease: Secondary | ICD-10-CM | POA: Diagnosis not present

## 2024-05-06 DIAGNOSIS — N183 Chronic kidney disease, stage 3 unspecified: Secondary | ICD-10-CM | POA: Diagnosis not present

## 2024-05-06 NOTE — Progress Notes (Addendum)
 Location:  Other ([facility] Home Room Number: 518 A Place of Service:  SNF ([phone]) Provider:  Medina-Vargas, Jessicah Croll, DNP, FNP-BC  [name], MD as PCP - General (Family Medicine) Abdul Fine, MD [facility])  Extended Emergency Contact Information Primary Emergency Contact: Cassidy Hernandez Address: [place], [name] Secondary Emergency Contact: [name]  Code Status:   Full Code  Goals of care: Advanced Directive information    12/11/2023   11:37 AM  Advanced Directives  Does Patient Have a Medical Advance Directive? Yes  Type of Estate agent of [place];Living will  Does patient want to make changes to medical advance directive? No - Patient declined  Copy of Healthcare Power of Attorney in Chart? Yes - validated most recent copy scanned in chart (See row information)     Chief Complaint  Patient presents with   Medical Management of Chronic Issues    Routine Visit    HPI:  Pt is a 83 y.o. female seen today for medical management of chronic diseases.    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 Has right foot brace due to foot drop -  takes aspirin  81 mg daily, Ezetimibe  10 mg daily, Plavix  75 mg daily and atorvastatin  80 mg daily  Benign hypertension with CKD (chronic kidney disease) stage III (HCC)  -  BP 130/80, takes carvedilol  3.125 mg twice a day and losartan  100 mg daily  Iron deficiency anemia, unspecified iron deficiency anemia type  -latest hemoglobin 13.2 (12/22/2023), takes ferrous sulfate 325 mg daily  Major depressive disorder, single episode, moderate (HCC) -takes escitalopram  20 mg daily  Chronic diastolic (congestive) heart  failure (HCC) -normal shortness of breath, takes chlorthalidone  25 mg daily  Past Medical History:  Diagnosis Date   Anginal pain    Aortic atherosclerosis    Cancer (HCC)    Carotid artery stenosis 02/01/2010   a.) Doppler 02/01/2010: 72% LICA and 60% RICA. b.) Doppler 12/30/2014: >70% LICA and 70% RICA. c.) Doppler 10/25/16: >70% Bilater ICAs   Chronic bilateral low back pain with left-sided sciatica    CKD (chronic kidney disease), stage III (HCC)    Coronary artery disease    a.) LHC 12/24/2014 -> small LAD system; diffuse CTO of LAD. 60% RCA. LM and LCx with no sig disease. no intervention; med mgmt.   Degenerative arthritis    Diastolic dysfunction    a.) TTE 09/20/2018: EF 55%, G1DD, mild LAE and RVE, triv-mild panvalvular regurgitation.   Dysarthria    GERD (gastroesophageal reflux disease)    Hepatic steatosis    History of kidney stones    HLD (hyperlipidemia)    Hypertension    Kidney stone    Lumbar radiculopathy    Major depression in remission    PVD (peripheral vascular disease)    Renal cyst, right 06/02/2015   a.) CT 06/02/2015 --> 4.4 cm RIGHT renal mass; MRI recommended. b.) MRI 06/16/2015 --> 3.8 x 4.2 x 4.0 cm proteinaceous/hemmorhagic cyst.   Subclavian steal syndrome    a.) LHC 12/24/2014 --> tight eccentric 90% ostial stenosis with damping.   T2DM (type 2 diabetes mellitus) (HCC)    Past Surgical History:  Procedure Laterality Date   ABDOMINAL HYSTERECTOMY  AUGMENTATION MAMMAPLASTY Bilateral 1980   BACK SURGERY  09/2018   BICEPT TENODESIS  06/10/2021   Procedure: BICEPS TENODESIS;  Surgeon: Edie Norleen PARAS, MD;  Location: [facility] ORS;  Service: Orthopedics;;   CARDIAC CATHETERIZATION Left 12/24/2014   Procedure: LEFT HEARD CATHETERIZATION; Location: [facility]; Surgeon: Norleen Molt, MD   CHOLECYSTECTOMY     COLONOSCOPY N/A 02/11/2016   Procedure: COLONOSCOPY;  Surgeon: Lamar ONEIDA Holmes, MD;  Location: [facility];  Service: Endoscopy;  Laterality: N/A;    IR CT HEAD LTD  11/16/2021   IR CT HEAD LTD  11/16/2021   IR INTRAVSC STENT CERV CAROTID W/O EMB-PROT MOD SED INC ANGIO  11/18/2021   IR PERCUTANEOUS ART THROMBECTOMY/INFUSION INTRACRANIAL INC DIAG ANGIO  11/16/2021   RADIOLOGY WITH ANESTHESIA N/A 11/16/2021   Procedure: IR WITH ANESTHESIA- CODE STROKE;  Surgeon: Radiologist, Medication, MD;  Location: [facility] OR;  Service: Radiology;  Laterality: N/A;   REVERSE SHOULDER ARTHROPLASTY Right 06/10/2021   Procedure: REVERSE SHOULDER ARTHROPLASTY;  Surgeon: Edie Norleen PARAS, MD;  Location: [facility] ORS;  Service: Orthopedics;  Laterality: Right;    Allergies  Allergen Reactions   Sulfa Antibiotics Other (See Comments)    Unknown reaction    Outpatient Encounter Medications as of 05/06/2024  Medication Sig   aspirin  81 MG chewable tablet Chew 1 tablet (81 mg total) by mouth daily.   atorvastatin  (LIPITOR ) 80 MG tablet Take 1 tablet (80 mg total) by mouth daily.   carvedilol  (COREG ) 3.125 MG tablet Take 3.125 mg by mouth 2 (two) times daily.   chlorthalidone  (HYGROTON ) 25 MG tablet Take 1 tablet (25 mg total) by mouth daily.   cholecalciferol (VITAMIN D3) 25 MCG (1000 UNIT) tablet Take 1,000 Units by mouth daily.   clopidogrel  (PLAVIX ) 75 MG tablet Take 1 tablet (75 mg total) by mouth daily.   docusate sodium  (COLACE) 100 MG capsule Take 100 mg by mouth 2 (two) times daily.   escitalopram  (LEXAPRO ) 10 MG tablet Take 1 tablet (10 mg total) by mouth daily.   ezetimibe  (ZETIA ) 10 MG tablet Take 1 tablet (10 mg total) by mouth daily.   ferrous sulfate 325 (65 FE) MG EC tablet Take 325 mg by mouth daily with breakfast.   losartan  (COZAAR ) 100 MG tablet Take 1 tablet (100 mg total) by mouth daily.   magnesium  chloride (SLOW-MAG) 64 MG TBEC SR tablet Take 1 tablet by mouth daily.   Multiple Vitamin (THEREMS PO) Take 1 tablet by mouth daily.   nitroGLYCERIN  (NITROSTAT ) 0.4 MG SL tablet Place 0.4 mg under the tongue every 5 (five) minutes as needed for chest pain.    pantoprazole  (PROTONIX ) 40 MG tablet Take 40 mg by mouth daily.   ticagrelor  (BRILINTA ) 90 MG TABS tablet Take 90 mg by mouth 2 (two) times daily.   tizanidine  (ZANAFLEX ) 2 MG capsule Take 1 capsule (2 mg total) by mouth 3 (three) times daily.   traMADol  (ULTRAM ) 50 MG tablet Take 50 mg by mouth every 6 (six) hours as needed.   No facility-administered encounter medications on file as of 05/06/2024.    Review of Systems  Constitutional:  Negative for appetite change, chills, fatigue and fever.  HENT:  Negative for congestion, hearing loss, rhinorrhea and sore throat.   Eyes: Negative.   Respiratory:  Negative for cough, shortness of breath and wheezing.   Cardiovascular:  Negative for chest pain, palpitations and leg swelling.  Gastrointestinal:  Negative for abdominal pain, constipation, diarrhea, nausea and vomiting.  Genitourinary:  Negative for dysuria.  Musculoskeletal:  Negative for arthralgias, back pain and myalgias.  Skin:  Negative for color change, rash and wound.  Neurological:  Negative for dizziness, weakness and headaches.  Psychiatric/Behavioral:  Negative for behavioral problems. The patient is not nervous/anxious.      Immunization History  Administered Date(s) Administered   INFLUENZA, HIGH DOSE SEASONAL PF 05/16/2019, 05/10/2020, 05/25/2021   Influenza Inj Mdck Quad Pf 05/02/2018   Influenza Split 05/26/2015   Influenza-Unspecified 05/01/2012, 05/12/2013, 06/08/2016, 04/28/2017, 05/26/2023   Moderna Covid-19 Vaccine Bivalent Booster 63yrs & up 05/25/2021   PFIZER Comirnaty(Gray Top)Covid-19 Tri-Sucrose Vaccine 08/15/2019, 09/07/2019, 05/10/2020   PFIZER(Purple Top)SARS-COV-2 Vaccination 08/15/2019, 09/07/2019, 05/02/2020, 11/25/2020, 05/05/2023   Pneumococcal Conjugate-13 05/26/2015   Pneumococcal Polysaccharide-23 08/09/2011   Tdap 09/19/2023   Zoster Recombinant(Shingrix) 08/30/2018, 01/03/2019   Zoster, Live 08/09/2011   Pertinent  Health Maintenance Due   Topic Date Due   OPHTHALMOLOGY EXAM  Never done   Influenza Vaccine  03/08/2024   HEMOGLOBIN A1C  07/06/2024   FOOT EXAM  01/03/2025   DEXA SCAN  Completed      11/23/2021    8:00 PM 11/24/2021    9:00 AM 11/24/2021    8:10 PM 11/25/2021    8:00 AM 01/04/2024    8:58 AM  Fall Risk  Falls in the past year?     1  Was there an injury with Fall?     1  Fall Risk Category Calculator     3  (RETIRED) Patient Fall Risk Level Moderate fall risk  Moderate fall risk  Moderate fall risk  Moderate fall risk    Patient at Risk for Falls Due to     History of fall(s);Impaired balance/gait;Impaired mobility  Fall risk Follow up     Falls evaluation completed     Data saved with a previous flowsheet row definition     Vitals:   05/06/24 1808  BP: 138/82  Pulse: 74  Resp: 20  Temp: 97.8 F (36.6 C)  SpO2: 96%  Weight: 163 lb (73.9 kg)  Height: 5' 2 (1.575 m)   Body mass index is 29.81 kg/m.  Physical Exam Constitutional:      General: She is not in acute distress.    Appearance: She is obese.  HENT:     Head: Normocephalic and atraumatic.     Nose: Nose normal.     Mouth/Throat:     Mouth: Mucous membranes are moist.  Eyes:     Conjunctiva/sclera: Conjunctivae normal.  Cardiovascular:     Rate and Rhythm: Normal rate and regular rhythm.  Pulmonary:     Effort: Pulmonary effort is normal.     Breath sounds: Normal breath sounds.  Abdominal:     General: Bowel sounds are normal.     Palpations: Abdomen is soft.  Musculoskeletal:     Cervical back: Normal range of motion.     Comments: Right foot brace  Skin:    General: Skin is warm and dry.  Neurological:     Mental Status: She is alert.     Motor: Weakness present.     Comments: Right-sided weakness   Psychiatric:        Mood and Affect: Mood normal.        Behavior: Behavior normal.      Labs reviewed: Recent Labs    11/23/23 0837 11/26/23 1906 11/30/23 0000  NA 136 134* 136*  K 3.4* 3.5 3.8  CL 100  95* 99  CO2 27 27 27*  GLUCOSE 121* 138*  --   BUN 23 26* 25*  CREATININE 1.20* 1.22* 1.0  CALCIUM  9.2 9.8 9.5   Recent Labs    11/23/23 0837  AST 25  ALT 29  ALKPHOS 50  BILITOT 0.7  PROT 6.8  ALBUMIN 3.7   Recent Labs    11/23/23 0837 11/26/23 1906 11/30/23 0000 12/22/23 0000  WBC 9.4 10.8* 10.3 9.3  NEUTROABS 6.2 7.1 6,211.00 5,534.00  HGB 13.1 13.6 11.8* 13.2  HCT 39.1 39.5 35* 40  MCV 97.3 95.2  --   --   PLT 274 292 262 315   No results found for: TSH Lab Results  Component Value Date   HGBA1C 6.1 01/04/2024   Lab Results  Component Value Date   CHOL 112 03/14/2023   HDL 48 03/14/2023   LDLCALC 41 03/14/2023   TRIG 117 03/14/2023   CHOLHDL 2.3 03/14/2023    Significant Diagnostic Results in last 30 days:  No results found.  Assessment/Plan  1. Hemiparesis affecting dominant side as late effect of cerebrovascular accident (CVA) (HCC) (Primary) - Continue hoyer lift for transfers - Continue right foot brace -Continue aspirin  81 mg daily, ezetimibe  daily 10 mg daily, Plavix  75 mg daily and atorvastatin  80 mg daily  2. Benign hypertension with CKD (chronic kidney disease) stage III (HCC) -BP stable-   continue carvedilol  3.125 mg twice a day -   Continue losartan  100 mg daily  3. Iron deficiency anemia, unspecified iron deficiency anemia type Lab Results  Component Value Date   HGB 13.2 12/22/2023    -  Within normal -   Discontinue ferrous sulfate -   CBC, CMP  4. Major depressive disorder, single episode, moderate (HCC) -Mood is stable   Continue-   escitalopram  20 mg daily  5. Chronic diastolic (congestive) heart failure (HCC) -No shortness of breath -  Continue chlorthalidone  25 mg daily    Family/ staff Communication: Discussed plan of care with resident and charge nurse.  Labs/tests ordered: CBC, CMP    Jereld Serum, DNP, MSN, FNP-BC [facility] [phone] (Monday-Friday 8:00 a.m. -  5:00 p.m.) [phone] (after hours)

## 2024-05-29 LAB — HM DIABETES FOOT EXAM

## 2024-06-04 ENCOUNTER — Non-Acute Institutional Stay (SKILLED_NURSING_FACILITY): Payer: Self-pay | Admitting: Nurse Practitioner

## 2024-06-04 ENCOUNTER — Encounter: Payer: Self-pay | Admitting: Nurse Practitioner

## 2024-06-04 DIAGNOSIS — Z8673 Personal history of transient ischemic attack (TIA), and cerebral infarction without residual deficits: Secondary | ICD-10-CM | POA: Diagnosis not present

## 2024-06-04 DIAGNOSIS — M8580 Other specified disorders of bone density and structure, unspecified site: Secondary | ICD-10-CM

## 2024-06-04 DIAGNOSIS — I5032 Chronic diastolic (congestive) heart failure: Secondary | ICD-10-CM

## 2024-06-04 DIAGNOSIS — Z6831 Body mass index (BMI) 31.0-31.9, adult: Secondary | ICD-10-CM

## 2024-06-04 DIAGNOSIS — N183 Chronic kidney disease, stage 3 unspecified: Secondary | ICD-10-CM

## 2024-06-04 DIAGNOSIS — E7800 Pure hypercholesterolemia, unspecified: Secondary | ICD-10-CM | POA: Diagnosis not present

## 2024-06-04 DIAGNOSIS — K219 Gastro-esophageal reflux disease without esophagitis: Secondary | ICD-10-CM | POA: Diagnosis not present

## 2024-06-04 DIAGNOSIS — R471 Dysarthria and anarthria: Secondary | ICD-10-CM

## 2024-06-04 DIAGNOSIS — F325 Major depressive disorder, single episode, in full remission: Secondary | ICD-10-CM | POA: Diagnosis not present

## 2024-06-04 DIAGNOSIS — I129 Hypertensive chronic kidney disease with stage 1 through stage 4 chronic kidney disease, or unspecified chronic kidney disease: Secondary | ICD-10-CM

## 2024-06-04 DIAGNOSIS — E1122 Type 2 diabetes mellitus with diabetic chronic kidney disease: Secondary | ICD-10-CM

## 2024-06-04 NOTE — Progress Notes (Signed)
 Location:  Other [facility].  Nursing Home Room Number: [facility] [id number] Place of Service:  SNF [phone]) [name], NP  PCP: Laurence Locus, DO  Patient Care Team: Laurence Locus, DO as PCP - General (Internal Medicine)  Extended Emergency Contact Information Primary Emergency Contact: Blinda Given Address: [name], [name]: [phone] Relation: Daughter Secondary Emergency Contact: [facility] Mobile Phone: [phone] Relation: Sister  Goals of care: Advanced Directive information    06/04/2024   11:36 AM  Advanced Directives  Does Patient Have a Medical Advance Directive? Yes  Type of Estate Agent of [place];Living will  Does patient want to make changes to medical advance directive? No - Patient declined  Copy of Healthcare Power of Attorney in Chart? Yes - validated most recent copy scanned in chart (See row information)     Chief Complaint  Patient presents with   Medical Management of Chronic Issues    Medical Management of Chronic Issues.     HPI:  The patient is an 83 year old female with a history of CHF, HTN, stroke, HLD, depression, and GERD, presenting for routine medical management of chronic conditions.   She reports no acute concerns but notes a 12-pound weight gain over the past year. She attributes this to decreased mobility, stating she is not eating more, though staff reports otherwise.  She reports feeling well overall. She participates in chair-based restorative therapy exercises but has been unable to walk since arriving at the facility. She previously ambulated with a walker, even after her stroke, and now expresses interest in attempting to walk again.   She denies pain, chest pain, shortness of breath, nausea, vomiting, diarrhea, or constipation. Bowel movements are regular, and she urinates without difficulty. She uses a lift for bathroom transfers and attends  meals in the main dining hall. No issues with swallowing. She takes her medications without difficulty.  Right foot pain, previously reported, has resolved. Sleep is adequate. She maintains regular contact with nearby family and denies any acute issues.   Past Medical History:  Diagnosis Date   Anginal pain    Aortic atherosclerosis    Cancer (HCC)    Carotid artery stenosis 02/01/2010   a.) Doppler 02/01/2010: 72% LICA and 60% RICA. b.) Doppler 12/30/2014: >70% LICA and 70% RICA. c.) Doppler 10/25/16: >70% Bilater ICAs   Chronic bilateral low back pain with left-sided sciatica    CKD (chronic kidney disease), stage III (HCC)    Coronary artery disease    a.) LHC 12/24/2014 -> small LAD system; diffuse CTO of LAD. 60% RCA. LM and LCx with no sig disease. no intervention; med mgmt.   Degenerative arthritis    Diastolic dysfunction    a.) TTE 09/20/2018: EF 55%, G1DD, mild LAE and RVE, triv-mild panvalvular regurgitation.   Dysarthria    GERD (gastroesophageal reflux disease)    Hepatic steatosis    History of kidney stones    HLD (hyperlipidemia)    Hypertension    Kidney stone    Lumbar radiculopathy    Major depression in remission    PVD (peripheral vascular disease)    Renal cyst, right 06/02/2015   a.) CT 06/02/2015 --> 4.4 cm RIGHT renal mass; MRI recommended. b.) MRI 06/16/2015 --> 3.8 x 4.2 x 4.0 cm proteinaceous/hemmorhagic cyst.   Subclavian steal syndrome    a.) LHC 12/24/2014 --> tight eccentric 90% ostial stenosis with damping.   T2DM (type  2 diabetes mellitus) [facility])    Past Surgical History:  Procedure Laterality Date   ABDOMINAL HYSTERECTOMY     AUGMENTATION MAMMAPLASTY Bilateral 1980   BACK SURGERY  09/2018   BICEPT TENODESIS  06/10/2021   Procedure: BICEPS TENODESIS;  Surgeon: Edie Norleen PARAS, MD;  Location: [facility] ORS;  Service: Orthopedics;;   CARDIAC CATHETERIZATION Left 12/24/2014   Procedure: LEFT HEARD CATHETERIZATION; Location: [facility]; Surgeon: Norleen Molt, MD    CHOLECYSTECTOMY     COLONOSCOPY N/A 02/11/2016   Procedure: COLONOSCOPY;  Surgeon: Lamar ONEIDA Holmes, MD;  Location: [facility] ENDOSCOPY;  Service: Endoscopy;  Laterality: N/A;   IR CT HEAD LTD  11/16/2021   IR CT HEAD LTD  11/16/2021   IR INTRAVSC STENT CERV CAROTID W/O EMB-PROT MOD SED INC ANGIO  11/18/2021   IR PERCUTANEOUS ART THROMBECTOMY/INFUSION INTRACRANIAL INC DIAG ANGIO  11/16/2021   RADIOLOGY WITH ANESTHESIA N/A 11/16/2021   Procedure: IR WITH ANESTHESIA- CODE STROKE;  Surgeon: Radiologist, Medication, MD;  Location: [facility] OR;  Service: Radiology;  Laterality: N/A;   REVERSE SHOULDER ARTHROPLASTY Right 06/10/2021   Procedure: REVERSE SHOULDER ARTHROPLASTY;  Surgeon: Edie Norleen PARAS, MD;  Location: [facility] ORS;  Service: Orthopedics;  Laterality: Right;   Allergies  Allergen Reactions   Sulfa Antibiotics Other (See Comments)    Unknown reaction   Outpatient Encounter Medications as of 06/04/2024  Medication Sig   acetaminophen  (TYLENOL ) 325 MG tablet Take 650 mg by mouth every 6 (six) hours as needed.   aspirin  81 MG chewable tablet Chew 1 tablet (81 mg total) by mouth daily.   atorvastatin  (LIPITOR ) 80 MG tablet Take 1 tablet (80 mg total) by mouth daily.   carvedilol  (COREG ) 3.125 MG tablet Take 3.125 mg by mouth 2 (two) times daily.   chlorthalidone  (HYGROTON ) 25 MG tablet Take 1 tablet (25 mg total) by mouth daily.   cholecalciferol (VITAMIN D3) 25 MCG (1000 UNIT) tablet Take 1,000 Units by mouth daily.   clopidogrel  (PLAVIX ) 75 MG tablet Take 1 tablet (75 mg total) by mouth daily.   docusate sodium  (COLACE) 100 MG capsule Take 100 mg by mouth 2 (two) times daily.   escitalopram  (LEXAPRO ) 20 MG tablet Take 20 mg by mouth daily.   ezetimibe  (ZETIA ) 10 MG tablet Take 1 tablet (10 mg total) by mouth daily.   losartan  (COZAAR ) 100 MG tablet Take 1 tablet (100 mg total) by mouth daily.   magnesium  chloride (SLOW-MAG) 64 MG TBEC SR tablet Take 1 tablet by mouth daily.   Multiple Vitamin  (THEREMS PO) Take 1 tablet by mouth daily.   nitroGLYCERIN  (NITROSTAT ) 0.4 MG SL tablet Place 0.4 mg under the tongue every 5 (five) minutes as needed for chest pain.   pantoprazole  (PROTONIX ) 40 MG tablet Take 40 mg by mouth daily.   tizanidine  (ZANAFLEX ) 2 MG capsule Take 1 capsule (2 mg total) by mouth 3 (three) times daily.   traMADol  (ULTRAM ) 50 MG tablet Take 50 mg by mouth every 6 (six) hours as needed.   escitalopram  (LEXAPRO ) 10 MG tablet Take 1 tablet (10 mg total) by mouth daily. (Patient not taking: Reported on 06/04/2024)   ferrous sulfate 325 (65 FE) MG EC tablet Take 325 mg by mouth daily with breakfast. (Patient not taking: Reported on 06/04/2024)   ticagrelor  (BRILINTA ) 90 MG TABS tablet Take 90 mg by mouth 2 (two) times daily. (Patient not taking: Reported on 06/04/2024)   No facility-administered encounter medications on file as of 06/04/2024.   Review of Systems  Constitutional:  Positive for unexpected weight change.  HENT: Negative.    Eyes: Negative.   Respiratory: Negative.    Cardiovascular: Negative.   Gastrointestinal: Negative.   Genitourinary: Negative.   Musculoskeletal: Negative.   Skin: Negative.   Neurological: Negative.   Psychiatric/Behavioral: Negative.      Immunization History  Administered Date(s) Administered   INFLUENZA, HIGH DOSE SEASONAL PF 05/16/2019, 05/10/2020, 05/25/2021   Influenza Inj Mdck Quad Pf 05/02/2018   Influenza Split 05/26/2015   Influenza-Unspecified 05/01/2012, 05/12/2013, 06/08/2016, 04/28/2017, 05/26/2023, 05/21/2024   Moderna Covid-19 Vaccine Bivalent Booster 63yrs & up 05/25/2021   PFIZER Comirnaty(Gray Top)Covid-19 Tri-Sucrose Vaccine 08/15/2019, 09/07/2019, 05/10/2020   PFIZER(Purple Top)SARS-COV-2 Vaccination 08/15/2019, 09/07/2019, 05/02/2020, 11/25/2020, 05/05/2023   Pneumococcal Conjugate-13 05/26/2015   Pneumococcal Polysaccharide-23 08/09/2011   Tdap 09/19/2023   Zoster Recombinant(Shingrix) 08/30/2018,  01/03/2019   Zoster, Live 08/09/2011   Pertinent  Health Maintenance Due  Topic Date Due   OPHTHALMOLOGY EXAM  Never done   HEMOGLOBIN A1C  07/06/2024   FOOT EXAM  01/03/2025   Influenza Vaccine  Completed   DEXA SCAN  Completed      11/23/2021    8:00 PM 11/24/2021    9:00 AM 11/24/2021    8:10 PM 11/25/2021    8:00 AM 01/04/2024    8:58 AM  Fall Risk  Falls in the past year?     1  Was there an injury with Fall?     1  Fall Risk Category Calculator     3  (RETIRED) Patient Fall Risk Level Moderate fall risk  Moderate fall risk  Moderate fall risk  Moderate fall risk    Patient at Risk for Falls Due to     History of fall(s);Impaired balance/gait;Impaired mobility  Fall risk Follow up     Falls evaluation completed     Data saved with a previous flowsheet row definition   Functional Status Survey:    Vitals:   06/04/24 1126  BP: 134/72  Pulse: 72  Resp: 20  Temp: (!) 97.4 F (36.3 C)  SpO2: 95%  Weight: 174 lb (78.9 kg)  Height: 5' 2 (1.575 m)   Body mass index is 31.83 kg/m. Physical Exam Vitals reviewed.  Constitutional:      Appearance: Normal appearance.  HENT:     Head: Normocephalic and atraumatic.     Right Ear: External ear normal.     Left Ear: External ear normal.     Nose: Nose normal.     Mouth/Throat:     Mouth: Mucous membranes are moist.     Pharynx: Oropharynx is clear.  Eyes:     Conjunctiva/sclera: Conjunctivae normal.  Cardiovascular:     Rate and Rhythm: Normal rate and regular rhythm.     Pulses: Normal pulses.     Heart sounds: Normal heart sounds.  Pulmonary:     Effort: Pulmonary effort is normal.     Breath sounds: Normal breath sounds.  Abdominal:     General: Bowel sounds are normal.     Palpations: Abdomen is soft.  Musculoskeletal:     Right lower leg: Edema present.     Left lower leg: Edema present.     Comments: Brace to right foot present  Skin:    General: Skin is warm and dry.  Neurological:     Mental Status:  She is alert and oriented to person, place, and time. Mental status is at baseline.     Cranial Nerves: Dysarthria present.  Psychiatric:        Mood and Affect: Mood normal.        Behavior: Behavior normal.    Labs reviewed: Recent Labs    11/23/23 0837 11/26/23 1906 11/30/23 0000 04/22/24 0000  NA 136 134* 136* 138  K 3.4* 3.5 3.8 4.0  CL 100 95* 99 101  CO2 27 27 27* 29*  GLUCOSE 121* 138*  --   --   BUN 23 26* 25* 26*  CREATININE 1.20* 1.22* 1.0 1.2*  CALCIUM  9.2 9.8 9.5 9.3   Recent Labs    11/23/23 0837  AST 25  ALT 29  ALKPHOS 50  BILITOT 0.7  PROT 6.8  ALBUMIN 3.7   Recent Labs    11/23/23 0837 11/26/23 1906 11/30/23 0000 12/22/23 0000  WBC 9.4 10.8* 10.3 9.3  NEUTROABS 6.2 7.1 6,211.00 5,534.00  HGB 13.1 13.6 11.8* 13.2  HCT 39.1 39.5 35* 40  MCV 97.3 95.2  --   --   PLT 274 292 262 315   No results found for: TSH Lab Results  Component Value Date   HGBA1C 6.1 01/04/2024   Lab Results  Component Value Date   CHOL 135 04/04/2024   HDL 56 04/04/2024   LDLCALC 60 04/04/2024   TRIG 106 04/04/2024   CHOLHDL 2.3 03/14/2023    Significant Diagnostic Results in last 30 days:  No results found.  Assessment/Plan  1. Major depression in remission - Stable. - Continue Lexapro  as prescribed. - Encourage participation in activities and meals in the main hall.  2. Chronic diastolic (congestive) heart failure (HCC) (Primary) - Chronic and stable. - Continue Carvedilol  as prescribed. - Reinforce low-fat, heart-healthy dietary habits.  3. Benign hypertension with CKD (chronic kidney disease) stage III (HCC) - Blood pressure stable. - Continue Losartan , Carvedilol , and Chlorthalidone  as prescribed. - Monitor CMP every 6 months.  4. Gastroesophageal reflux disease without esophagitis - Stable; no acute complaints. - Continue Protonix  as prescribed.  5. Controlled type 2 diabetes mellitus with stage 3 chronic kidney disease, without long-term  current use of insulin  (HCC) - Stable. - Last A1c: 6.1 (May 2025). - Continue routine monitoring. - Discussed low-carb diet and limiting extra snacks. -will follow up a1c  6. Osteopenia, unspecified location - Last bone density scan: 01/10/24. - Continue routine monitoring. - Continue Vitamin D  and Calcium  supplementation.  7. Dysarthria - Stable and at baseline. - Patient able to speak in full sentences and respond appropriately.  8. Hypercholesteremia - Chronic and stable. - Lipid profile checked August 2025; within target range. - Continue Lipitor  as prescribed. - Annual lipid monitoring.  9. History of stroke - Stable and at baseline. - Continue Aspirin , Plavix ,as prescribed.  10. BMI 31 Has gained weight, due to decrease in mobility and food intake Discussed cutting back on desserts and salty snacks   Irfat Avie, AGPCNP Student - [facility] -I personally was present during the history, physical exam and medical decision-making activities of this service and have verified that the service and findings are accurately documented in the student's note Shuan Statzer K. Caro BODILY [facility] [phone]

## 2024-06-04 NOTE — Progress Notes (Deleted)
 Location:  Other [facility].  Nursing Home Room Number: [facility] [id number] Place of Service:  SNF [phone]) [name], NP  PCP: Laurence Locus, DO  Patient Care Team: Laurence Locus, DO as PCP - General (Internal Medicine)  Extended Emergency Contact Information Primary Emergency Contact: [name], [name]: [phone] Relation: Daughter Secondary Emergency Contact: [facility] Mobile Phone: [phone] Relation: Sister  Goals of care: Advanced Directive information    06/04/2024   11:36 AM  Advanced Directives  Does Patient Have a Medical Advance Directive? Yes  Type of Estate Agent of [place];Living will  Does patient want to make changes to medical advance directive? No - Patient declined  Copy of Healthcare Power of Attorney in Chart? Yes - validated most recent copy scanned in chart (See row information)     Chief Complaint  Patient presents with   Medical Management of Chronic Issues    Medical Management of Chronic Issues.     HPI:  Pt is a 83 y.o. female seen today for medical management of chronic disease.    Past Medical History:  Diagnosis Date   Anginal pain    Aortic atherosclerosis    Cancer (HCC)    Carotid artery stenosis 02/01/2010   a.) Doppler 02/01/2010: 72% LICA and 60% RICA. b.) Doppler 12/30/2014: >70% LICA and 70% RICA. c.) Doppler 10/25/16: >70% Bilater ICAs   Chronic bilateral low back pain with left-sided sciatica    CKD (chronic kidney disease), stage III (HCC)    Coronary artery disease    a.) LHC 12/24/2014 -> small LAD system; diffuse CTO of LAD. 60% RCA. LM and LCx with no sig disease. no intervention; med mgmt.   Degenerative arthritis    Diastolic dysfunction    a.) TTE 09/20/2018: EF 55%, G1DD, mild LAE and RVE, triv-mild panvalvular regurgitation.   Dysarthria    GERD (gastroesophageal reflux disease)    Hepatic steatosis     History of kidney stones    HLD (hyperlipidemia)    Hypertension    Kidney stone    Lumbar radiculopathy    Major depression in remission    PVD (peripheral vascular disease)    Renal cyst, right 06/02/2015   a.) CT 06/02/2015 --> 4.4 cm RIGHT renal mass; MRI recommended. b.) MRI 06/16/2015 --> 3.8 x 4.2 x 4.0 cm proteinaceous/hemmorhagic cyst.   Subclavian steal syndrome    a.) LHC 12/24/2014 --> tight eccentric 90% ostial stenosis with damping.   T2DM (type 2 diabetes mellitus) [facility])    Past Surgical History:  Procedure Laterality Date   ABDOMINAL HYSTERECTOMY     AUGMENTATION MAMMAPLASTY Bilateral 1980   BACK SURGERY  09/2018   BICEPT TENODESIS  06/10/2021   Procedure: BICEPS TENODESIS;  Surgeon: Edie Norleen PARAS, MD;  Location: [facility] ORS;  Service: Orthopedics;;   CARDIAC CATHETERIZATION Left 12/24/2014   Procedure: LEFT HEARD CATHETERIZATION; Location: [facility]; Surgeon: Norleen Molt, MD   CHOLECYSTECTOMY     COLONOSCOPY N/A 02/11/2016   Procedure: COLONOSCOPY;  Surgeon: Lamar ONEIDA Holmes, MD;  Location: [facility] ENDOSCOPY;  Service: Endoscopy;  Laterality: N/A;   IR CT HEAD LTD  11/16/2021   IR CT HEAD LTD  11/16/2021   IR INTRAVSC STENT CERV CAROTID W/O EMB-PROT MOD SED INC ANGIO  11/18/2021   IR PERCUTANEOUS ART THROMBECTOMY/INFUSION INTRACRANIAL INC DIAG ANGIO  11/16/2021   RADIOLOGY WITH ANESTHESIA N/A 11/16/2021   Procedure: IR  WITH ANESTHESIA- CODE STROKE;  Surgeon: Radiologist, Medication, MD;  Location: [facility] OR;  Service: Radiology;  Laterality: N/A;   REVERSE SHOULDER ARTHROPLASTY Right 06/10/2021   Procedure: REVERSE SHOULDER ARTHROPLASTY;  Surgeon: Edie Norleen PARAS, MD;  Location: [facility] ORS;  Service: Orthopedics;  Laterality: Right;    Allergies  Allergen Reactions   Sulfa Antibiotics Other (See Comments)    Unknown reaction    Outpatient Encounter Medications as of 06/04/2024  Medication Sig   acetaminophen  (TYLENOL ) 325 MG tablet Take 650 mg by mouth every 6 (six) hours as needed.    aspirin  81 MG chewable tablet Chew 1 tablet (81 mg total) by mouth daily.   atorvastatin  (LIPITOR ) 80 MG tablet Take 1 tablet (80 mg total) by mouth daily.   carvedilol  (COREG ) 3.125 MG tablet Take 3.125 mg by mouth 2 (two) times daily.   chlorthalidone  (HYGROTON ) 25 MG tablet Take 1 tablet (25 mg total) by mouth daily.   cholecalciferol (VITAMIN D3) 25 MCG (1000 UNIT) tablet Take 1,000 Units by mouth daily.   clopidogrel  (PLAVIX ) 75 MG tablet Take 1 tablet (75 mg total) by mouth daily.   docusate sodium  (COLACE) 100 MG capsule Take 100 mg by mouth 2 (two) times daily.   escitalopram  (LEXAPRO ) 20 MG tablet Take 20 mg by mouth daily.   ezetimibe  (ZETIA ) 10 MG tablet Take 1 tablet (10 mg total) by mouth daily.   losartan  (COZAAR ) 100 MG tablet Take 1 tablet (100 mg total) by mouth daily.   magnesium  chloride (SLOW-MAG) 64 MG TBEC SR tablet Take 1 tablet by mouth daily.   Multiple Vitamin (THEREMS PO) Take 1 tablet by mouth daily.   nitroGLYCERIN  (NITROSTAT ) 0.4 MG SL tablet Place 0.4 mg under the tongue every 5 (five) minutes as needed for chest pain.   pantoprazole  (PROTONIX ) 40 MG tablet Take 40 mg by mouth daily.   tizanidine  (ZANAFLEX ) 2 MG capsule Take 1 capsule (2 mg total) by mouth 3 (three) times daily.   traMADol  (ULTRAM ) 50 MG tablet Take 50 mg by mouth every 6 (six) hours as needed.   escitalopram  (LEXAPRO ) 10 MG tablet Take 1 tablet (10 mg total) by mouth daily. (Patient not taking: Reported on 06/04/2024)   ferrous sulfate 325 (65 FE) MG EC tablet Take 325 mg by mouth daily with breakfast. (Patient not taking: Reported on 06/04/2024)   ticagrelor  (BRILINTA ) 90 MG TABS tablet Take 90 mg by mouth 2 (two) times daily. (Patient not taking: Reported on 06/04/2024)   No facility-administered encounter medications on file as of 06/04/2024.    Review of Systems ***  Immunization History  Administered Date(s) Administered   INFLUENZA, HIGH DOSE SEASONAL PF 05/16/2019, 05/10/2020,  05/25/2021   Influenza Inj Mdck Quad Pf 05/02/2018   Influenza Split 05/26/2015   Influenza-Unspecified 05/01/2012, 05/12/2013, 06/08/2016, 04/28/2017, 05/26/2023, 05/21/2024   Moderna Covid-19 Vaccine Bivalent Booster 40yrs & up 05/25/2021   PFIZER Comirnaty(Gray Top)Covid-19 Tri-Sucrose Vaccine 08/15/2019, 09/07/2019, 05/10/2020   PFIZER(Purple Top)SARS-COV-2 Vaccination 08/15/2019, 09/07/2019, 05/02/2020, 11/25/2020, 05/05/2023   Pneumococcal Conjugate-13 05/26/2015   Pneumococcal Polysaccharide-23 08/09/2011   Tdap 09/19/2023   Zoster Recombinant(Shingrix) 08/30/2018, 01/03/2019   Zoster, Live 08/09/2011   Pertinent  Health Maintenance Due  Topic Date Due   OPHTHALMOLOGY EXAM  Never done   HEMOGLOBIN A1C  07/06/2024   FOOT EXAM  01/03/2025   Influenza Vaccine  Completed   DEXA SCAN  Completed      11/23/2021    8:00 PM 11/24/2021    9:00 AM 11/24/2021  8:10 PM 11/25/2021    8:00 AM 01/04/2024    8:58 AM  Fall Risk  Falls in the past year?     1  Was there an injury with Fall?     1  Fall Risk Category Calculator     3  (RETIRED) Patient Fall Risk Level Moderate fall risk  Moderate fall risk  Moderate fall risk  Moderate fall risk    Patient at Risk for Falls Due to     History of fall(s);Impaired balance/gait;Impaired mobility  Fall risk Follow up     Falls evaluation completed     Data saved with a previous flowsheet row definition   Functional Status Survey:    Vitals:   06/04/24 1126  BP: 134/72  Pulse: 72  Resp: 20  Temp: (!) 97.4 F (36.3 C)  SpO2: 95%  Weight: 174 lb (78.9 kg)  Height: 5' 2 (1.575 m)   Body mass index is 31.83 kg/m. Physical Exam***  Labs reviewed: Recent Labs    11/23/23 0837 11/26/23 1906 11/30/23 0000 04/22/24 0000  NA 136 134* 136* 138  K 3.4* 3.5 3.8 4.0  CL 100 95* 99 101  CO2 27 27 27* 29*  GLUCOSE 121* 138*  --   --   BUN 23 26* 25* 26*  CREATININE 1.20* 1.22* 1.0 1.2*  CALCIUM  9.2 9.8 9.5 9.3   Recent Labs     11/23/23 0837  AST 25  ALT 29  ALKPHOS 50  BILITOT 0.7  PROT 6.8  ALBUMIN 3.7   Recent Labs    11/23/23 0837 11/26/23 1906 11/30/23 0000 12/22/23 0000  WBC 9.4 10.8* 10.3 9.3  NEUTROABS 6.2 7.1 6,211.00 5,534.00  HGB 13.1 13.6 11.8* 13.2  HCT 39.1 39.5 35* 40  MCV 97.3 95.2  --   --   PLT 274 292 262 315   No results found for: TSH Lab Results  Component Value Date   HGBA1C 6.1 01/04/2024   Lab Results  Component Value Date   CHOL 135 04/04/2024   HDL 56 04/04/2024   LDLCALC 60 04/04/2024   TRIG 106 04/04/2024   CHOLHDL 2.3 03/14/2023    Significant Diagnostic Results in last 30 days:  No results found.  Assessment/Plan No problem-specific Assessment & Plan notes found for this encounter.     Steed Kanaan K. Caro BODILY [facility] [phone]

## 2024-06-06 DIAGNOSIS — I69351 Hemiplegia and hemiparesis following cerebral infarction affecting right dominant side: Secondary | ICD-10-CM | POA: Diagnosis not present

## 2024-06-06 DIAGNOSIS — E1122 Type 2 diabetes mellitus with diabetic chronic kidney disease: Secondary | ICD-10-CM | POA: Diagnosis not present

## 2024-06-06 DIAGNOSIS — I129 Hypertensive chronic kidney disease with stage 1 through stage 4 chronic kidney disease, or unspecified chronic kidney disease: Secondary | ICD-10-CM | POA: Diagnosis not present

## 2024-06-06 LAB — CBC AND DIFFERENTIAL
HCT: 38 (ref 36–46)
Hemoglobin: 12.5 (ref 12.0–16.0)
Neutrophils Absolute: 4282
Platelets: 271 K/uL (ref 150–400)
WBC: 7.8

## 2024-06-06 LAB — CBC: RBC: 3.95 (ref 3.87–5.11)

## 2024-06-06 LAB — HEPATIC FUNCTION PANEL
ALT: 20 U/L (ref 7–35)
AST: 20 (ref 13–35)
Alkaline Phosphatase: 59 (ref 25–125)
Bilirubin, Total: 0.4

## 2024-06-06 LAB — BASIC METABOLIC PANEL WITH GFR
BUN: 20 (ref 4–21)
CO2: 25 — AB (ref 13–22)
Chloride: 103 (ref 99–108)
Creatinine: 1 (ref 0.5–1.1)
Glucose: 176
Potassium: 4 meq/L (ref 3.5–5.1)
Sodium: 137 (ref 137–147)

## 2024-06-06 LAB — COMPREHENSIVE METABOLIC PANEL WITH GFR
Albumin: 3.8 (ref 3.5–5.0)
Calcium: 9.2 (ref 8.7–10.7)
Globulin: 2.6
eGFR: 54

## 2024-06-10 LAB — BASIC METABOLIC PANEL WITH GFR
BUN: 19 (ref 4–21)
CO2: 31 — AB (ref 13–22)
Chloride: 99 (ref 99–108)
Creatinine: 1.1 (ref 0.5–1.1)
Glucose: 116
Potassium: 4.3 meq/L (ref 3.5–5.1)
Sodium: 137 (ref 137–147)

## 2024-06-10 LAB — HEMOGLOBIN A1C: Hemoglobin A1C: 6.1

## 2024-06-10 LAB — CBC AND DIFFERENTIAL
HCT: 39 (ref 36–46)
Hemoglobin: 12.9 (ref 12.0–16.0)
Neutrophils Absolute: 5059
Platelets: 283 K/uL (ref 150–400)
WBC: 8.2

## 2024-06-10 LAB — CBC: RBC: 4.06 (ref 3.87–5.11)

## 2024-06-10 LAB — HEPATIC FUNCTION PANEL
ALT: 21 U/L (ref 7–35)
AST: 19 (ref 13–35)
Alkaline Phosphatase: 48 (ref 25–125)
Bilirubin, Total: 0.6

## 2024-06-10 LAB — COMPREHENSIVE METABOLIC PANEL WITH GFR
Albumin: 4.1 (ref 3.5–5.0)
Calcium: 9.6 (ref 8.7–10.7)
Globulin: 2.7
eGFR: 51

## 2024-06-25 DIAGNOSIS — I779 Disorder of arteries and arterioles, unspecified: Secondary | ICD-10-CM | POA: Diagnosis not present

## 2024-06-25 DIAGNOSIS — N183 Chronic kidney disease, stage 3 unspecified: Secondary | ICD-10-CM | POA: Diagnosis not present

## 2024-06-25 DIAGNOSIS — I6523 Occlusion and stenosis of bilateral carotid arteries: Secondary | ICD-10-CM | POA: Diagnosis not present

## 2024-06-25 DIAGNOSIS — I129 Hypertensive chronic kidney disease with stage 1 through stage 4 chronic kidney disease, or unspecified chronic kidney disease: Secondary | ICD-10-CM | POA: Diagnosis not present

## 2024-06-25 DIAGNOSIS — I2510 Atherosclerotic heart disease of native coronary artery without angina pectoris: Secondary | ICD-10-CM | POA: Diagnosis not present

## 2024-06-25 DIAGNOSIS — G458 Other transient cerebral ischemic attacks and related syndromes: Secondary | ICD-10-CM | POA: Diagnosis not present

## 2024-06-25 DIAGNOSIS — E7800 Pure hypercholesterolemia, unspecified: Secondary | ICD-10-CM | POA: Diagnosis not present

## 2024-07-02 ENCOUNTER — Non-Acute Institutional Stay (SKILLED_NURSING_FACILITY): Payer: Self-pay | Admitting: Nurse Practitioner

## 2024-07-02 ENCOUNTER — Encounter: Payer: Self-pay | Admitting: Nurse Practitioner

## 2024-07-02 DIAGNOSIS — N183 Chronic kidney disease, stage 3 unspecified: Secondary | ICD-10-CM | POA: Diagnosis not present

## 2024-07-02 DIAGNOSIS — K219 Gastro-esophageal reflux disease without esophagitis: Secondary | ICD-10-CM

## 2024-07-02 DIAGNOSIS — I129 Hypertensive chronic kidney disease with stage 1 through stage 4 chronic kidney disease, or unspecified chronic kidney disease: Secondary | ICD-10-CM | POA: Diagnosis not present

## 2024-07-02 DIAGNOSIS — E7800 Pure hypercholesterolemia, unspecified: Secondary | ICD-10-CM | POA: Diagnosis not present

## 2024-07-02 DIAGNOSIS — I69959 Hemiplegia and hemiparesis following unspecified cerebrovascular disease affecting unspecified side: Secondary | ICD-10-CM | POA: Diagnosis not present

## 2024-07-02 DIAGNOSIS — E1122 Type 2 diabetes mellitus with diabetic chronic kidney disease: Secondary | ICD-10-CM

## 2024-07-02 DIAGNOSIS — K5904 Chronic idiopathic constipation: Secondary | ICD-10-CM | POA: Diagnosis not present

## 2024-07-02 DIAGNOSIS — M5416 Radiculopathy, lumbar region: Secondary | ICD-10-CM | POA: Diagnosis not present

## 2024-07-02 DIAGNOSIS — I5032 Chronic diastolic (congestive) heart failure: Secondary | ICD-10-CM | POA: Diagnosis not present

## 2024-07-02 DIAGNOSIS — Z5181 Encounter for therapeutic drug level monitoring: Secondary | ICD-10-CM | POA: Insufficient documentation

## 2024-07-02 DIAGNOSIS — F411 Generalized anxiety disorder: Secondary | ICD-10-CM | POA: Diagnosis not present

## 2024-07-02 DIAGNOSIS — M8580 Other specified disorders of bone density and structure, unspecified site: Secondary | ICD-10-CM

## 2024-07-02 NOTE — Assessment & Plan Note (Signed)
 Controlled, continue protonix

## 2024-07-02 NOTE — Assessment & Plan Note (Signed)
   07/02/2024- no anxiety or depression reported- will decrease lexapro  from 20 to 10 mg daily

## 2024-07-02 NOTE — Assessment & Plan Note (Signed)
 Euvolemic at this time, continues on losartan  and coreg 

## 2024-07-02 NOTE — Assessment & Plan Note (Signed)
 Denies any anxiety at this time Will attempt GDR of lexapro  from 20 to 10 mg daily

## 2024-07-02 NOTE — Assessment & Plan Note (Addendum)
 Continues on lipitor  80 mg daily and zetia .  LDL at goal

## 2024-07-02 NOTE — Assessment & Plan Note (Signed)
 Blood pressure well controlled, goal bp <140/90 Continue current medications and dietary modifications follow metabolic panel

## 2024-07-02 NOTE — Assessment & Plan Note (Signed)
 Continues on vit d supplement

## 2024-07-02 NOTE — Assessment & Plan Note (Addendum)
 Reports more pain to right leg Tylenol  has been effective- will schedule tylenol  650 mg TID continues on asa and plavix  with statin therapy

## 2024-07-02 NOTE — Progress Notes (Signed)
 Location:  Other [facility].  Nursing Home Room Number: [name] [id number] Place of Service:  SNF (31) [name], NP  PCP: Laurence Locus, DO  Patient Care Team: Laurence Locus, DO as PCP - General (Internal Medicine)  Extended Emergency Contact Information Primary Emergency Contact: [facility] Address: [name], [name]: [phone] Relation: Daughter Secondary Emergency Contact: [facility] Mobile Phone: [phone] Relation: Sister  Goals of care: Advanced Directive information    06/04/2024   11:36 AM  Advanced Directives  Does Patient Have a Medical Advance Directive? Yes  Type of Estate Agent of [place];Living will  Does patient want to make changes to medical advance directive? No - Patient declined  Copy of Healthcare Power of Attorney in Chart? Yes - validated most recent copy scanned in chart (See row information)     Chief Complaint  Patient presents with   Medical Management of Chronic Issues    Medical Management of Chronic Issues.     HPI:  Pt is a 83 y.o. female seen today for medical management of chronic disease.  Pt with hx of chf, cva, depression, DM, HTN, CKD.  Pt reports pain to right leg- which was effected by her CVA. More consistent. States it will wake her up at night. No injury Tylenol  is effective to help pain. She is nonambulatory.   She continues to eat well reports she has cut back from snacking- weight has been stable.  No worsening LE edema No shortness of breath, cough, congestion or chest pain.     Past Medical History:  Diagnosis Date   Anginal pain    Aortic atherosclerosis    Cancer (HCC)    Carotid artery stenosis 02/01/2010   a.) Doppler 02/01/2010: 72% LICA and 60% RICA. b.) Doppler 12/30/2014: >70% LICA and 70% RICA. c.) Doppler 10/25/16: >70% Bilater ICAs   Chronic bilateral low back pain with left-sided sciatica    CKD (chronic  kidney disease), stage III (HCC)    Coronary artery disease    a.) LHC 12/24/2014 -> small LAD system; diffuse CTO of LAD. 60% RCA. LM and LCx with no sig disease. no intervention; med mgmt.   Degenerative arthritis    Diastolic dysfunction    a.) TTE 09/20/2018: EF 55%, G1DD, mild LAE and RVE, triv-mild panvalvular regurgitation.   Dysarthria    GERD (gastroesophageal reflux disease)    Hepatic steatosis    History of kidney stones    HLD (hyperlipidemia)    Hypertension    Kidney stone    Lumbar radiculopathy    Major depression in remission    PVD (peripheral vascular disease)    Renal cyst, right 06/02/2015   a.) CT 06/02/2015 --> 4.4 cm RIGHT renal mass; MRI recommended. b.) MRI 06/16/2015 --> 3.8 x 4.2 x 4.0 cm proteinaceous/hemmorhagic cyst.   Subclavian steal syndrome    a.) LHC 12/24/2014 --> tight eccentric 90% ostial stenosis with damping.   T2DM (type 2 diabetes mellitus) [facility])    Past Surgical History:  Procedure Laterality Date   ABDOMINAL HYSTERECTOMY     AUGMENTATION MAMMAPLASTY Bilateral 1980   BACK SURGERY  09/2018   BICEPT TENODESIS  06/10/2021   Procedure: BICEPS TENODESIS;  Surgeon: Edie Norleen PARAS, MD;  Location: [facility] ORS;  Service: Orthopedics;;   CARDIAC CATHETERIZATION Left 12/24/2014   Procedure: LEFT HEARD CATHETERIZATION; Location: [facility]; Surgeon: Norleen Molt, MD   CHOLECYSTECTOMY  COLONOSCOPY N/A 02/11/2016   Procedure: COLONOSCOPY;  Surgeon: Lamar ONEIDA Holmes, MD;  Location: [facility] ENDOSCOPY;  Service: Endoscopy;  Laterality: N/A;   IR CT HEAD LTD  11/16/2021   IR CT HEAD LTD  11/16/2021   IR INTRAVSC STENT CERV CAROTID W/O EMB-PROT MOD SED INC ANGIO  11/18/2021   IR PERCUTANEOUS ART THROMBECTOMY/INFUSION INTRACRANIAL INC DIAG ANGIO  11/16/2021   RADIOLOGY WITH ANESTHESIA N/A 11/16/2021   Procedure: IR WITH ANESTHESIA- CODE STROKE;  Surgeon: Radiologist, Medication, MD;  Location: [facility] OR;  Service: Radiology;  Laterality: N/A;   REVERSE SHOULDER ARTHROPLASTY  Right 06/10/2021   Procedure: REVERSE SHOULDER ARTHROPLASTY;  Surgeon: Edie Norleen PARAS, MD;  Location: [facility] ORS;  Service: Orthopedics;  Laterality: Right;    Allergies  Allergen Reactions   Sulfa Antibiotics Other (See Comments)    Unknown reaction    Outpatient Encounter Medications as of 07/02/2024  Medication Sig   acetaminophen  (TYLENOL ) 325 MG tablet Take 650 mg by mouth every 6 (six) hours as needed.   aspirin  81 MG chewable tablet Chew 1 tablet (81 mg total) by mouth daily.   atorvastatin  (LIPITOR ) 80 MG tablet Take 1 tablet (80 mg total) by mouth daily.   carvedilol  (COREG ) 3.125 MG tablet Take 3.125 mg by mouth 2 (two) times daily.   chlorthalidone  (HYGROTON ) 25 MG tablet Take 1 tablet (25 mg total) by mouth daily.   cholecalciferol (VITAMIN D3) 25 MCG (1000 UNIT) tablet Take 1,000 Units by mouth daily.   clopidogrel  (PLAVIX ) 75 MG tablet Take 1 tablet (75 mg total) by mouth daily.   docusate sodium  (COLACE) 100 MG capsule Take 100 mg by mouth 2 (two) times daily.   escitalopram  (LEXAPRO ) 20 MG tablet Take 20 mg by mouth daily.   ezetimibe  (ZETIA ) 10 MG tablet Take 1 tablet (10 mg total) by mouth daily.   losartan  (COZAAR ) 100 MG tablet Take 1 tablet (100 mg total) by mouth daily.   magnesium  chloride (SLOW-MAG) 64 MG TBEC SR tablet Take 1 tablet by mouth daily.   Multiple Vitamin (THEREMS PO) Take 1 tablet by mouth daily.   nitroGLYCERIN  (NITROSTAT ) 0.4 MG SL tablet Place 0.4 mg under the tongue every 5 (five) minutes as needed for chest pain.   pantoprazole  (PROTONIX ) 40 MG tablet Take 40 mg by mouth daily.   tizanidine  (ZANAFLEX ) 2 MG capsule Take 1 capsule (2 mg total) by mouth 3 (three) times daily.   traMADol  (ULTRAM ) 50 MG tablet Take 50 mg by mouth every 6 (six) hours as needed.   escitalopram  (LEXAPRO ) 10 MG tablet Take 1 tablet (10 mg total) by mouth daily. (Patient not taking: Reported on 07/02/2024)   ferrous sulfate 325 (65 FE) MG EC tablet Take 325 mg by mouth daily  with breakfast. (Patient not taking: Reported on 07/02/2024)   ticagrelor  (BRILINTA ) 90 MG TABS tablet Take 90 mg by mouth 2 (two) times daily. (Patient not taking: Reported on 07/02/2024)   No facility-administered encounter medications on file as of 07/02/2024.    Review of Systems  Constitutional:  Negative for activity change, appetite change, fatigue and unexpected weight change.  HENT:  Negative for congestion and hearing loss.   Eyes: Negative.   Respiratory:  Negative for cough and shortness of breath.   Cardiovascular:  Negative for chest pain, palpitations and leg swelling.  Gastrointestinal:  Negative for abdominal pain, constipation and diarrhea.  Genitourinary:  Negative for difficulty urinating and dysuria.  Musculoskeletal:  Positive for arthralgias, gait problem and myalgias.  Skin:  Negative for color change and wound.  Neurological:  Positive for weakness. Negative for dizziness.  Psychiatric/Behavioral:  Negative for agitation, behavioral problems and confusion.      Immunization History  Administered Date(s) Administered   INFLUENZA, HIGH DOSE SEASONAL PF 05/16/2019, 05/10/2020, 05/25/2021   Influenza Inj Mdck Quad Pf 05/02/2018   Influenza Split 05/26/2015   Influenza-Unspecified 05/01/2012, 05/12/2013, 06/08/2016, 04/28/2017, 05/26/2023, 05/21/2024   Moderna Covid-19 Vaccine Bivalent Booster 26yrs & up 05/25/2021   PFIZER Comirnaty(Gray Top)Covid-19 Tri-Sucrose Vaccine 08/15/2019, 09/07/2019, 05/10/2020   PFIZER(Purple Top)SARS-COV-2 Vaccination 08/15/2019, 09/07/2019, 05/02/2020, 11/25/2020, 05/05/2023   Pneumococcal Conjugate-13 05/26/2015   Pneumococcal Polysaccharide-23 08/09/2011   Tdap 09/19/2023   Zoster Recombinant(Shingrix) 08/30/2018, 01/03/2019   Zoster, Live 08/09/2011   Pertinent  Health Maintenance Due  Topic Date Due   OPHTHALMOLOGY EXAM  Never done   HEMOGLOBIN A1C  12/08/2024   FOOT EXAM  01/03/2025   Influenza Vaccine  Completed   Bone  Density Scan  Completed      11/23/2021    8:00 PM 11/24/2021    9:00 AM 11/24/2021    8:10 PM 11/25/2021    8:00 AM 01/04/2024    8:58 AM  Fall Risk  Falls in the past year?     1  Was there an injury with Fall?     1  Fall Risk Category Calculator     3  (RETIRED) Patient Fall Risk Level Moderate fall risk  Moderate fall risk  Moderate fall risk  Moderate fall risk    Patient at Risk for Falls Due to     History of fall(s);Impaired balance/gait;Impaired mobility  Fall risk Follow up     Falls evaluation completed     Data saved with a previous flowsheet row definition   Functional Status Survey:    Vitals:   07/02/24 1003  BP: (!) 142/84  Pulse: 76  Resp: 20  Temp: 97.7 F (36.5 C)  SpO2: 94%  Weight: 178 lb 9.6 oz (81 kg)  Height: 5' 2 (1.575 m)   Body mass index is 32.67 kg/m. Physical Exam Constitutional:      General: She is not in acute distress.    Appearance: She is well-developed. She is not diaphoretic.  HENT:     Head: Normocephalic and atraumatic.     Mouth/Throat:     Pharynx: No oropharyngeal exudate.  Eyes:     Conjunctiva/sclera: Conjunctivae normal.     Pupils: Pupils are equal, round, and reactive to light.  Cardiovascular:     Rate and Rhythm: Normal rate and regular rhythm.     Heart sounds: Normal heart sounds.  Pulmonary:     Effort: Pulmonary effort is normal.     Breath sounds: Normal breath sounds.  Abdominal:     General: Bowel sounds are normal.     Palpations: Abdomen is soft.  Musculoskeletal:     Cervical back: Normal range of motion and neck supple.     Right lower leg: No edema.     Left lower leg: No edema.  Skin:    General: Skin is warm and dry.  Neurological:     Mental Status: She is alert. Mental status is at baseline.     Motor: Weakness present.     Gait: Gait abnormal.  Psychiatric:        Mood and Affect: Mood normal.     Labs reviewed: Recent Labs    11/23/23 0837 11/26/23 1906 11/30/23 0000  04/22/24 0000  06/06/24 0000 06/10/24 0000  NA 136 134*   < > 138 137 137  K 3.4* 3.5   < > 4.0 4.0 4.3  CL 100 95*   < > 101 103 99  CO2 27 27   < > 29* 25* 31*  GLUCOSE 121* 138*  --   --   --   --   BUN 23 26*   < > 26* 20 19  CREATININE 1.20* 1.22*   < > 1.2* 1.0 1.1  CALCIUM  9.2 9.8   < > 9.3 9.2 9.6   < > = values in this interval not displayed.   Recent Labs    11/23/23 0837 06/06/24 0000 06/10/24 0000  AST 25 20 19   ALT 29 20 21   ALKPHOS 50 59 48  BILITOT 0.7  --   --   PROT 6.8  --   --   ALBUMIN 3.7 3.8 4.1   Recent Labs    11/23/23 0837 11/26/23 1906 11/30/23 0000 12/22/23 0000 06/06/24 0000 06/10/24 0000  WBC 9.4 10.8*   < > 9.3 7.8 8.2  NEUTROABS 6.2 7.1   < > 5,534.00 4,282.00 5,059.00  HGB 13.1 13.6   < > 13.2 12.5 12.9  HCT 39.1 39.5   < > 40 38 39  MCV 97.3 95.2  --   --   --   --   PLT 274 292   < > 315 271 283   < > = values in this interval not displayed.   No results found for: TSH Lab Results  Component Value Date   HGBA1C 6.1 06/10/2024   Lab Results  Component Value Date   CHOL 135 04/04/2024   HDL 56 04/04/2024   LDLCALC 60 04/04/2024   TRIG 106 04/04/2024   CHOLHDL 2.3 03/14/2023    Significant Diagnostic Results in last 30 days:  No results found.  Assessment/Plan Osteopenia Continues on vit d supplement   Lumbar radiculopathy Stable, without increase in pain at this time. Due to leg pain will schedule tylenol  325 mg 2 tablets TID   Hypercholesteremia Continues on lipitor  80 mg daily and zetia .  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) Reports more pain to right leg Tylenol  has been effective- will schedule tylenol  650 mg TID continues on asa and plavix  with statin therapy   Gastroesophageal reflux disease without esophagitis Controlled, continue protonix   Controlled type 2 diabetes mellitus with stage 3 chronic kidney disease, without long-term current use of insulin  (HCC) Diet  controlled, continue lifestyle modifications A1c at 6.1  Constipation, unspecified Well controlled on colace 100 mg BID  Chronic diastolic (congestive) heart failure (HCC) Euvolemic at this time, continues on losartan  and coreg   Benign hypertension with CKD (chronic kidney disease) stage III (HCC) Blood pressure well controlled, goal bp <140/90 Continue current medications and dietary modifications follow metabolic panel  Anxiety disorder, unspecified Denies any anxiety at this time Will attempt GDR of lexapro  from 20 to 10 mg daily   Encounter for medication titration   07/02/2024- no anxiety or depression reported- will decrease lexapro  from 20 to 10 mg daily       Tu Shimmel K. Caro BODILY [facility] [phone]

## 2024-07-02 NOTE — Assessment & Plan Note (Signed)
 Diet controlled, continue lifestyle modifications A1c at 6.1

## 2024-07-02 NOTE — Assessment & Plan Note (Signed)
 Stable, without increase in pain at this time. Due to leg pain will schedule tylenol  325 mg 2 tablets TID

## 2024-07-02 NOTE — Assessment & Plan Note (Signed)
 Well controlled on colace 100 mg BID

## 2024-07-23 ENCOUNTER — Non-Acute Institutional Stay: Payer: Self-pay | Admitting: Nurse Practitioner

## 2024-07-23 ENCOUNTER — Encounter: Payer: Self-pay | Admitting: Nurse Practitioner

## 2024-07-23 DIAGNOSIS — R600 Localized edema: Secondary | ICD-10-CM

## 2024-07-23 DIAGNOSIS — J069 Acute upper respiratory infection, unspecified: Secondary | ICD-10-CM

## 2024-07-23 DIAGNOSIS — N183 Chronic kidney disease, stage 3 unspecified: Secondary | ICD-10-CM | POA: Diagnosis not present

## 2024-07-23 DIAGNOSIS — I129 Hypertensive chronic kidney disease with stage 1 through stage 4 chronic kidney disease, or unspecified chronic kidney disease: Secondary | ICD-10-CM

## 2024-07-23 DIAGNOSIS — I69959 Hemiplegia and hemiparesis following unspecified cerebrovascular disease affecting unspecified side: Secondary | ICD-10-CM

## 2024-07-23 DIAGNOSIS — E1122 Type 2 diabetes mellitus with diabetic chronic kidney disease: Secondary | ICD-10-CM

## 2024-07-23 DIAGNOSIS — I5032 Chronic diastolic (congestive) heart failure: Secondary | ICD-10-CM | POA: Diagnosis not present

## 2024-07-23 DIAGNOSIS — K219 Gastro-esophageal reflux disease without esophagitis: Secondary | ICD-10-CM | POA: Diagnosis not present

## 2024-07-23 NOTE — Assessment & Plan Note (Signed)
 Stable, continues with brace to right ankle for support due to right sided hemiparesis  continues on asa and plavix  with statin therapy

## 2024-07-23 NOTE — Assessment & Plan Note (Signed)
 Blood pressure well controlled Continue current medications and dietary modifications follow metabolic panel

## 2024-07-23 NOTE — Assessment & Plan Note (Signed)
 Diet controlled, continue lifestyle modifications A1c at 6.1

## 2024-07-23 NOTE — Assessment & Plan Note (Signed)
 Weight has been stable Now with LE edema but without shortness of breath  Will monitor weights and symptoms Continues on losartan  and coreg 

## 2024-07-23 NOTE — Progress Notes (Signed)
 Location:  Other [facility] Room Number: [name] [id number] Place of Service:  SNF [phone]) [name], NP  PCP: Laurence Locus, DO  Patient Care Team: Laurence Locus, DO as PCP - General (Internal Medicine)  Extended Emergency Contact Information Primary Emergency Contact: Blinda Given Address: [name], [name]: [phone] Relation: Daughter Secondary Emergency Contact: [name] Mobile Phone: [phone] Relation: Sister  Goals of care: Advanced Directive information    06/04/2024   11:36 AM  Advanced Directives  Does Patient Have a Medical Advance Directive? Yes  Type of Estate Agent of [place];Living will  Does patient want to make changes to medical advance directive? No - Patient declined  Copy of Healthcare Power of Attorney in Chart? Yes - validated most recent copy scanned in chart (See row information)     Chief Complaint  Patient presents with   Medical Management of Chronic Issues    Medical Management of Chronic Issues.     HPI:  Pt is a 83 y.o. female seen today for medical management of chronic disease. Pt with hx of CVA, CKD, CHF, HTN, depression, hyperlipidemia.  Pt has been having cough and congestion over the last few days, flu and covid test were negative.  She denies shortness of breath but reports cough and chest congestion Denies fevers or chills.   Staff reports she has had increase in LE edema.  She has compression socks that she did not wear today because she reports it is too tight on her toes She has had increase in snacking and been snacking on pretzels   Denies anxiety or depression   Hx of CVA with right sided hemiparesis. She has a brace to help with support She is unable to walk due to weakness and instability.   Past Medical History:  Diagnosis Date   Anginal pain    Aortic atherosclerosis    Cancer (HCC)    Carotid artery stenosis  02/01/2010   a.) Doppler 02/01/2010: 72% LICA and 60% RICA. b.) Doppler 12/30/2014: >70% LICA and 70% RICA. c.) Doppler 10/25/16: >70% Bilater ICAs   Chronic bilateral low back pain with left-sided sciatica    CKD (chronic kidney disease), stage III (HCC)    Coronary artery disease    a.) LHC 12/24/2014 -> small LAD system; diffuse CTO of LAD. 60% RCA. LM and LCx with no sig disease. no intervention; med mgmt.   Degenerative arthritis    Diastolic dysfunction    a.) TTE 09/20/2018: EF 55%, G1DD, mild LAE and RVE, triv-mild panvalvular regurgitation.   Dysarthria    GERD (gastroesophageal reflux disease)    Hepatic steatosis    History of kidney stones    HLD (hyperlipidemia)    Hypertension    Kidney stone    Lumbar radiculopathy    Major depression in remission    PVD (peripheral vascular disease)    Renal cyst, right 06/02/2015   a.) CT 06/02/2015 --> 4.4 cm RIGHT renal mass; MRI recommended. b.) MRI 06/16/2015 --> 3.8 x 4.2 x 4.0 cm proteinaceous/hemmorhagic cyst.   Subclavian steal syndrome    a.) LHC 12/24/2014 --> tight eccentric 90% ostial stenosis with damping.   T2DM (type 2 diabetes mellitus) [facility])    Past Surgical History:  Procedure Laterality Date   ABDOMINAL HYSTERECTOMY     AUGMENTATION MAMMAPLASTY Bilateral 1980   BACK SURGERY  09/2018   BICEPT TENODESIS  06/10/2021  Procedure: BICEPS TENODESIS;  Surgeon: Edie Norleen PARAS, MD;  Location: [facility] ORS;  Service: Orthopedics;;   CARDIAC CATHETERIZATION Left 12/24/2014   Procedure: LEFT HEARD CATHETERIZATION; Location: [facility]; Surgeon: Norleen Molt, MD   CHOLECYSTECTOMY     COLONOSCOPY N/A 02/11/2016   Procedure: COLONOSCOPY;  Surgeon: Lamar ONEIDA Holmes, MD;  Location: [facility] ENDOSCOPY;  Service: Endoscopy;  Laterality: N/A;   IR CT HEAD LTD  11/16/2021   IR CT HEAD LTD  11/16/2021   IR INTRAVSC STENT CERV CAROTID W/O EMB-PROT MOD SED INC ANGIO  11/18/2021   IR PERCUTANEOUS ART THROMBECTOMY/INFUSION INTRACRANIAL INC DIAG ANGIO   11/16/2021   RADIOLOGY WITH ANESTHESIA N/A 11/16/2021   Procedure: IR WITH ANESTHESIA- CODE STROKE;  Surgeon: Radiologist, Medication, MD;  Location: [facility] OR;  Service: Radiology;  Laterality: N/A;   REVERSE SHOULDER ARTHROPLASTY Right 06/10/2021   Procedure: REVERSE SHOULDER ARTHROPLASTY;  Surgeon: Edie Norleen PARAS, MD;  Location: [facility] ORS;  Service: Orthopedics;  Laterality: Right;    Allergies[1]  Outpatient Encounter Medications as of 07/23/2024  Medication Sig   acetaminophen  (TYLENOL ) 325 MG tablet Take 650 mg by mouth 3 (three) times daily.   aspirin  81 MG chewable tablet Chew 1 tablet (81 mg total) by mouth daily.   atorvastatin  (LIPITOR ) 80 MG tablet Take 1 tablet (80 mg total) by mouth daily.   carvedilol  (COREG ) 3.125 MG tablet Take 3.125 mg by mouth 2 (two) times daily.   chlorthalidone  (HYGROTON ) 25 MG tablet Take 1 tablet (25 mg total) by mouth daily.   cholecalciferol (VITAMIN D3) 25 MCG (1000 UNIT) tablet Take 1,000 Units by mouth daily.   clopidogrel  (PLAVIX ) 75 MG tablet Take 1 tablet (75 mg total) by mouth daily.   docusate sodium  (COLACE) 100 MG capsule Take 100 mg by mouth 2 (two) times daily.   escitalopram  (LEXAPRO ) 10 MG tablet Take 1 tablet (10 mg total) by mouth daily.   ezetimibe  (ZETIA ) 10 MG tablet Take 1 tablet (10 mg total) by mouth daily.   losartan  (COZAAR ) 100 MG tablet Take 1 tablet (100 mg total) by mouth daily.   magnesium  chloride (SLOW-MAG) 64 MG TBEC SR tablet Take 1 tablet by mouth daily.   Multiple Vitamin (THEREMS PO) Take 1 tablet by mouth daily.   nitroGLYCERIN  (NITROSTAT ) 0.4 MG SL tablet Place 0.4 mg under the tongue every 5 (five) minutes as needed for chest pain.   pantoprazole  (PROTONIX ) 40 MG tablet Take 40 mg by mouth daily.   tizanidine  (ZANAFLEX ) 2 MG capsule Take 1 capsule (2 mg total) by mouth 3 (three) times daily.   escitalopram  (LEXAPRO ) 20 MG tablet Take 20 mg by mouth daily. (Patient not taking: Reported on 07/23/2024)   ferrous sulfate  325 (65 FE) MG EC tablet Take 325 mg by mouth daily with breakfast. (Patient not taking: Reported on 07/23/2024)   ticagrelor  (BRILINTA ) 90 MG TABS tablet Take 90 mg by mouth 2 (two) times daily. (Patient not taking: Reported on 07/23/2024)   traMADol  (ULTRAM ) 50 MG tablet Take 50 mg by mouth every 6 (six) hours as needed. (Patient not taking: Reported on 07/23/2024)   No facility-administered encounter medications on file as of 07/23/2024.    Review of Systems  Constitutional:  Negative for activity change, appetite change, fatigue and unexpected weight change.  HENT:  Negative for congestion and hearing loss.   Eyes: Negative.   Respiratory:  Positive for cough. Negative for shortness of breath and wheezing.   Cardiovascular:  Positive for leg swelling. Negative for  chest pain and palpitations.  Gastrointestinal:  Negative for abdominal pain, constipation and diarrhea.  Genitourinary:  Negative for difficulty urinating and dysuria.  Musculoskeletal:  Negative for arthralgias and myalgias.  Skin:  Negative for color change and wound.  Neurological:  Negative for dizziness and weakness.  Psychiatric/Behavioral:  Negative for agitation, behavioral problems and confusion.      Immunization History  Administered Date(s) Administered   INFLUENZA, HIGH DOSE SEASONAL PF 05/16/2019, 05/10/2020, 05/25/2021   Influenza Inj Mdck Quad Pf 05/02/2018   Influenza Split 05/26/2015   Influenza-Unspecified 05/01/2012, 05/12/2013, 06/08/2016, 04/28/2017, 05/26/2023, 05/21/2024   Moderna Covid-19 Vaccine Bivalent Booster 43yrs & up 05/25/2021   PFIZER Comirnaty(Gray Top)Covid-19 Tri-Sucrose Vaccine 08/15/2019, 09/07/2019, 05/10/2020   PFIZER(Purple Top)SARS-COV-2 Vaccination 08/15/2019, 09/07/2019, 05/02/2020, 11/25/2020, 05/05/2023   Pneumococcal Conjugate-13 05/26/2015   Pneumococcal Polysaccharide-23 08/09/2011   Tdap 09/19/2023   Zoster Recombinant(Shingrix) 08/30/2018, 01/03/2019   Zoster, Live  08/09/2011   Pertinent  Health Maintenance Due  Topic Date Due   OPHTHALMOLOGY EXAM  Never done   HEMOGLOBIN A1C  12/08/2024   FOOT EXAM  01/03/2025   Influenza Vaccine  Completed   Bone Density Scan  Completed      11/23/2021    8:00 PM 11/24/2021    9:00 AM 11/24/2021    8:10 PM 11/25/2021    8:00 AM 01/04/2024    8:58 AM  Fall Risk  Falls in the past year?     1  Was there an injury with Fall?     1   Fall Risk Category Calculator     3  (RETIRED) Patient Fall Risk Level Moderate fall risk  Moderate fall risk  Moderate fall risk  Moderate fall risk    Patient at Risk for Falls Due to     History of fall(s);Impaired balance/gait;Impaired mobility  Fall risk Follow up     Falls evaluation completed     Data saved with a previous flowsheet row definition   Functional Status Survey:    Vitals:   07/23/24 1233  BP: 129/70  Pulse: 80  Resp: 20  Temp: 97.7 F (36.5 C)  SpO2: 96%  Weight: 182 lb 6.4 oz (82.7 kg)  Height: 5' 2 (1.575 m)   Body mass index is 33.36 kg/m. Wt Readings from Last 3 Encounters:  07/23/24 182 lb 6.4 oz (82.7 kg)  07/02/24 178 lb 9.6 oz (81 kg)  06/04/24 174 lb (78.9 kg)    Physical Exam Constitutional:      General: She is not in acute distress.    Appearance: She is well-developed. She is not diaphoretic.  HENT:     Head: Normocephalic and atraumatic.     Mouth/Throat:     Pharynx: No oropharyngeal exudate.  Eyes:     Conjunctiva/sclera: Conjunctivae normal.     Pupils: Pupils are equal, round, and reactive to light.  Cardiovascular:     Rate and Rhythm: Normal rate and regular rhythm.     Heart sounds: Normal heart sounds.  Pulmonary:     Effort: Pulmonary effort is normal.     Breath sounds: Normal breath sounds.  Abdominal:     General: Bowel sounds are normal.     Palpations: Abdomen is soft.  Musculoskeletal:     Cervical back: Normal range of motion and neck supple.     Right lower leg: Edema (1+) present.     Left lower  leg: Edema (1+) present.  Skin:    General: Skin is warm and  dry.  Neurological:     Mental Status: She is alert and oriented to person, place, and time.     Motor: Weakness present.     Gait: Gait abnormal.  Psychiatric:        Mood and Affect: Mood normal.    Labs reviewed: Recent Labs    11/23/23 0837 11/26/23 1906 11/30/23 0000 04/22/24 0000 06/06/24 0000 06/10/24 0000  NA 136 134*   < > 138 137 137  K 3.4* 3.5   < > 4.0 4.0 4.3  CL 100 95*   < > 101 103 99  CO2 27 27   < > 29* 25* 31*  GLUCOSE 121* 138*  --   --   --   --   BUN 23 26*   < > 26* 20 19  CREATININE 1.20* 1.22*   < > 1.2* 1.0 1.1  CALCIUM  9.2 9.8   < > 9.3 9.2 9.6   < > = values in this interval not displayed.   Recent Labs    11/23/23 0837 06/06/24 0000 06/10/24 0000  AST 25 20 19   ALT 29 20 21   ALKPHOS 50 59 48  BILITOT 0.7  --   --   PROT 6.8  --   --   ALBUMIN 3.7 3.8 4.1   Recent Labs    11/23/23 0837 11/26/23 1906 11/30/23 0000 12/22/23 0000 06/06/24 0000 06/10/24 0000  WBC 9.4 10.8*   < > 9.3 7.8 8.2  NEUTROABS 6.2 7.1   < > 5,534.00 4,282.00 5,059.00  HGB 13.1 13.6   < > 13.2 12.5 12.9  HCT 39.1 39.5   < > 40 38 39  MCV 97.3 95.2  --   --   --   --   PLT 274 292   < > 315 271 283   < > = values in this interval not displayed.   No results found for: TSH Lab Results  Component Value Date   HGBA1C 6.1 06/10/2024   Lab Results  Component Value Date   CHOL 135 04/04/2024   HDL 56 04/04/2024   LDLCALC 60 04/04/2024   TRIG 106 04/04/2024   CHOLHDL 2.3 03/14/2023    Significant Diagnostic Results in last 30 days:  No results found.  Assessment/Plan No problem-specific Assessment & Plan notes found for this encounter. Cassidy Hernandez was seen today for medical management of chronic issues.  Benign hypertension with CKD (chronic kidney disease) stage III (HCC) Overview: Last Assessment & Plan:  Formatting of this note might be different from the original. Taking medications  without noted side effects or dizziness.  Uremic symptoms such as nausea and itching are not noted and nephrotoxic medications are being avoided.  Assessment & Plan: Blood pressure well controlled Continue current medications and dietary modifications follow metabolic panel   Chronic diastolic (congestive) heart failure (HCC) Assessment & Plan: Weight has been stable Now with LE edema but without shortness of breath  Will monitor weights and symptoms Continues on losartan  and coreg    Controlled type 2 diabetes mellitus with stage 3 chronic kidney disease, without long-term current use of insulin  (HCC) Overview: Last Assessment & Plan:  Formatting of this note might be different from the original. Following a diabetic diet and medications are tolerated as appropriate.  Uremic symptoms such as nausea and itching are not noted and nephrotoxic medications are being avoided.  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, unspecified cerebrovascular disease type, unspecified laterality (HCC) Assessment & Plan: Stable, continues with brace to right ankle for support due to right sided hemiparesis  continues on asa and plavix  with statin therapy    Gastroesophageal reflux disease without esophagitis Assessment & Plan: Controlled, continue protonix    Upper respiratory infection with cough and congestion - will add mucinex DM by mouth twice daily with full glass of water -covid and flu swab negative when checked yesterday No fevers or chills No shortness of breath reported Continue supportive care Encourage hydration  Bilateral leg edema --encouraged to elevate legs above level of heart as tolerates, low sodium diet encouraged- she has been eating a lot of salty snacks, compression hose as tolerates (on in am, off in pm)- Staff will get compression hose without toes Nursing to  monitor Will hold off on adding lasix due to URI at this time and monitor.       Merrick Feutz K. Caro BODILY [facility] [phone]       [1]  Allergies Allergen Reactions   Sulfa Antibiotics Other (See Comments)    Unknown reaction

## 2024-07-23 NOTE — Assessment & Plan Note (Signed)
 Controlled, continue protonix

## 2024-08-14 ENCOUNTER — Non-Acute Institutional Stay (SKILLED_NURSING_FACILITY): Payer: Self-pay | Admitting: Orthopedic Surgery

## 2024-08-14 ENCOUNTER — Encounter: Payer: Self-pay | Admitting: Orthopedic Surgery

## 2024-08-14 DIAGNOSIS — H9201 Otalgia, right ear: Secondary | ICD-10-CM

## 2024-08-14 DIAGNOSIS — H6123 Impacted cerumen, bilateral: Secondary | ICD-10-CM | POA: Diagnosis not present

## 2024-08-14 MED ORDER — DEBROX 6.5 % OT SOLN: 5.0000 [drp] | Freq: Two times a day (BID) | OTIC | Status: AC

## 2024-08-14 NOTE — Progress Notes (Signed)
 " Location:  Other [place] [facility].  Nursing Home Room Number: [facility] [id number] Place of Service:  SNF [phone]) Provider:  Greig Cluster, NP  PCP: Laurence Locus, DO  Patient Care Team: Laurence Locus, DO as PCP - General (Internal Medicine)  Extended Emergency Contact Information Primary Emergency Contact: Blinda Given Address: [name], [name]: [phone] Relation: Daughter Secondary Emergency Contact: [name]: [phone] Relation: Sister  Code Status:  Full Code.  Goals of care: Advanced Directive information    08/14/2024   10:32 AM  Advanced Directives  Does Patient Have a Medical Advance Directive? Yes  Type of Estate Agent of [place];Living will  Does patient want to make changes to medical advance directive? No - Patient declined  Copy of Healthcare Power of Attorney in Chart? Yes - validated most recent copy scanned in chart (See row information)     Chief Complaint  Patient presents with   Ear Pain    Ear Pain.     HPI:  Pt is a 84 y.o. female seen today for acute visit due to right ear pain.   She currently resides on the skilled nursing unit at [facility]. PMH: HTN, CAD, CHF, PVD, CKD, GERD, hepatic steatosis, T2DM, hemiparesis due to CVA (2023), neurocognitive disorder, osteopenia, depression and insomnia.   Right ear pain x 1 day. Tylenol  given by nursing with some relief. No recent fall or injury. No rash noted. Afebrile. Vitals stable.    Past Medical History:  Diagnosis Date   Anginal pain    Aortic atherosclerosis    Cancer (HCC)    Carotid artery stenosis 02/01/2010   a.) Doppler 02/01/2010: 72% LICA and 60% RICA. b.) Doppler 12/30/2014: >70% LICA and 70% RICA. c.) Doppler 10/25/16: >70% Bilater ICAs   Chronic bilateral low back pain with left-sided sciatica    CKD (chronic kidney disease), stage III (HCC)    Coronary artery disease    a.) LHC 12/24/2014 ->  small LAD system; diffuse CTO of LAD. 60% RCA. LM and LCx with no sig disease. no intervention; med mgmt.   Degenerative arthritis    Diastolic dysfunction    a.) TTE 09/20/2018: EF 55%, G1DD, mild LAE and RVE, triv-mild panvalvular regurgitation.   Dysarthria    GERD (gastroesophageal reflux disease)    Hepatic steatosis    History of kidney stones    HLD (hyperlipidemia)    Hypertension    Kidney stone    Lumbar radiculopathy    Major depression in remission    PVD (peripheral vascular disease)    Renal cyst, right 06/02/2015   a.) CT 06/02/2015 --> 4.4 cm RIGHT renal mass; MRI recommended. b.) MRI 06/16/2015 --> 3.8 x 4.2 x 4.0 cm proteinaceous/hemmorhagic cyst.   Subclavian steal syndrome    a.) LHC 12/24/2014 --> tight eccentric 90% ostial stenosis with damping.   T2DM (type 2 diabetes mellitus) [facility]    Past Surgical History:  Procedure Laterality Date   ABDOMINAL HYSTERECTOMY     AUGMENTATION MAMMAPLASTY Bilateral 1980   BACK SURGERY  09/2018   BICEPT TENODESIS  06/10/2021   Procedure: BICEPS TENODESIS;  Surgeon: Edie Norleen PARAS, MD;  Location: [facility] ORS;  Service: Orthopedics;;   CARDIAC CATHETERIZATION Left 12/24/2014   Procedure: LEFT HEARD CATHETERIZATION; Location: [facility]; Surgeon: Norleen Molt, MD   CHOLECYSTECTOMY     COLONOSCOPY N/A 02/11/2016   Procedure: COLONOSCOPY;  Surgeon: Lamar ONEIDA Holmes,  MD;  Location: [facility] ENDOSCOPY;  Service: Endoscopy;  Laterality: N/A;   IR CT HEAD LTD  11/16/2021   IR CT HEAD LTD  11/16/2021   IR INTRAVSC STENT CERV CAROTID W/O EMB-PROT MOD SED INC ANGIO  11/18/2021   IR PERCUTANEOUS ART THROMBECTOMY/INFUSION INTRACRANIAL INC DIAG ANGIO  11/16/2021   RADIOLOGY WITH ANESTHESIA N/A 11/16/2021   Procedure: IR WITH ANESTHESIA- CODE STROKE;  Surgeon: Radiologist, Medication, MD;  Location: [facility] OR;  Service: Radiology;  Laterality: N/A;   REVERSE SHOULDER ARTHROPLASTY Right 06/10/2021   Procedure: REVERSE SHOULDER ARTHROPLASTY;  Surgeon: Edie Norleen PARAS,  MD;  Location: [facility] ORS;  Service: Orthopedics;  Laterality: Right;    Allergies[1]  Outpatient Encounter Medications as of 08/14/2024  Medication Sig   acetaminophen  (TYLENOL ) 325 MG tablet Take 650 mg by mouth 3 (three) times daily.   aspirin  81 MG chewable tablet Chew 1 tablet (81 mg total) by mouth daily.   atorvastatin  (LIPITOR ) 80 MG tablet Take 1 tablet (80 mg total) by mouth daily.   carvedilol  (COREG ) 3.125 MG tablet Take 3.125 mg by mouth 2 (two) times daily.   chlorthalidone  (HYGROTON ) 25 MG tablet Take 1 tablet (25 mg total) by mouth daily.   cholecalciferol (VITAMIN D3) 25 MCG (1000 UNIT) tablet Take 1,000 Units by mouth daily.   clopidogrel  (PLAVIX ) 75 MG tablet Take 1 tablet (75 mg total) by mouth daily.   docusate sodium  (COLACE) 100 MG capsule Take 100 mg by mouth 2 (two) times daily.   escitalopram  (LEXAPRO ) 10 MG tablet Take 1 tablet (10 mg total) by mouth daily.   ezetimibe  (ZETIA ) 10 MG tablet Take 1 tablet (10 mg total) by mouth daily.   guaifenesin (ROBITUSSIN) 100 MG/5ML syrup Take 5 mLs by mouth every 8 (eight) hours as needed for cough.   losartan  (COZAAR ) 100 MG tablet Take 1 tablet (100 mg total) by mouth daily.   magnesium  chloride (SLOW-MAG) 64 MG TBEC SR tablet Take 1 tablet by mouth daily.   Multiple Vitamin (THEREMS PO) Take 1 tablet by mouth daily.   nitroGLYCERIN  (NITROSTAT ) 0.4 MG SL tablet Place 0.4 mg under the tongue every 5 (five) minutes as needed for chest pain.   pantoprazole  (PROTONIX ) 40 MG tablet Take 40 mg by mouth daily.   tizanidine  (ZANAFLEX ) 2 MG capsule Take 1 capsule (2 mg total) by mouth 3 (three) times daily.   No facility-administered encounter medications on file as of 08/14/2024.    Review of Systems  Unable to perform ROS: Other    Immunization History  Administered Date(s) Administered   INFLUENZA, HIGH DOSE SEASONAL PF 05/16/2019, 05/10/2020, 05/25/2021   Influenza Inj Mdck Quad Pf 05/02/2018   Influenza Split 05/26/2015    Influenza-Unspecified 05/01/2012, 05/12/2013, 06/08/2016, 04/28/2017, 05/26/2023, 05/21/2024   Moderna Covid-19 Vaccine Bivalent Booster 51yrs & up 05/25/2021   PFIZER Comirnaty(Gray Top)Covid-19 Tri-Sucrose Vaccine 08/15/2019, 09/07/2019, 05/10/2020   PFIZER(Purple Top)SARS-COV-2 Vaccination 08/15/2019, 09/07/2019, 05/02/2020, 11/25/2020, 05/05/2023   Pneumococcal Conjugate-13 05/26/2015   Pneumococcal Polysaccharide-23 08/09/2011   Tdap 09/19/2023   Unspecified SARS-COV-2 Vaccination 05/31/2024   Zoster Recombinant(Shingrix) 08/30/2018, 01/03/2019   Zoster, Live 08/09/2011   Pertinent  Health Maintenance Due  Topic Date Due   OPHTHALMOLOGY EXAM  Never done   HEMOGLOBIN A1C  12/08/2024   FOOT EXAM  05/29/2025   Influenza Vaccine  Completed   Bone Density Scan  Completed      11/23/2021    8:00 PM 11/24/2021    9:00 AM 11/24/2021    8:10  PM 11/25/2021    8:00 AM 01/04/2024    8:58 AM  Fall Risk  Falls in the past year?     1  Was there an injury with Fall?     1   Fall Risk Category Calculator     3  (RETIRED) Patient Fall Risk Level Moderate fall risk  Moderate fall risk  Moderate fall risk  Moderate fall risk    Patient at Risk for Falls Due to     History of fall(s);Impaired balance/gait;Impaired mobility  Fall risk Follow up     Falls evaluation completed     Data saved with a previous flowsheet row definition   Functional Status Survey:    Vitals:   08/14/24 1026  BP: (!) 148/82  Pulse: 82  Resp: 20  Temp: 97.7 F (36.5 C)  SpO2: 95%  Weight: 183 lb 6.4 oz (83.2 kg)  Height: 5' 2 (1.575 m)   Body mass index is 33.54 kg/m. Physical Exam Vitals reviewed.  Constitutional:      General: She is not in acute distress. HENT:     Head: Normocephalic.     Right Ear: There is impacted cerumen.     Left Ear: There is impacted cerumen.     Ears:     Comments: No tenderness, erythema, rash, swelling to bilateral outer ears, tolerated exam well  Eyes:     General:         Right eye: No discharge.        Left eye: No discharge.  Cardiovascular:     Rate and Rhythm: Normal rate and regular rhythm.     Pulses: Normal pulses.     Heart sounds: Normal heart sounds.  Pulmonary:     Effort: Pulmonary effort is normal.     Breath sounds: Normal breath sounds.  Neurological:     General: No focal deficit present.     Mental Status: She is alert. Mental status is at baseline.  Psychiatric:        Mood and Affect: Mood normal.     Labs reviewed: Recent Labs    11/23/23 0837 11/26/23 1906 11/30/23 0000 04/22/24 0000 06/06/24 0000 06/10/24 0000  NA 136 134*   < > 138 137 137  K 3.4* 3.5   < > 4.0 4.0 4.3  CL 100 95*   < > 101 103 99  CO2 27 27   < > 29* 25* 31*  GLUCOSE 121* 138*  --   --   --   --   BUN 23 26*   < > 26* 20 19  CREATININE 1.20* 1.22*   < > 1.2* 1.0 1.1  CALCIUM  9.2 9.8   < > 9.3 9.2 9.6   < > = values in this interval not displayed.   Recent Labs    11/23/23 0837 06/06/24 0000 06/10/24 0000  AST 25 20 19   ALT 29 20 21   ALKPHOS 50 59 48  BILITOT 0.7  --   --   PROT 6.8  --   --   ALBUMIN 3.7 3.8 4.1   Recent Labs    11/23/23 0837 11/26/23 1906 11/30/23 0000 12/22/23 0000 06/06/24 0000 06/10/24 0000  WBC 9.4 10.8*   < > 9.3 7.8 8.2  NEUTROABS 6.2 7.1   < > 5,534.00 4,282.00 5,059.00  HGB 13.1 13.6   < > 13.2 12.5 12.9  HCT 39.1 39.5   < > 40 38 39  MCV 97.3 95.2  --   --   --   --  PLT 274 292   < > 315 271 283   < > = values in this interval not displayed.   No results found for: TSH Lab Results  Component Value Date   HGBA1C 6.1 06/10/2024   Lab Results  Component Value Date   CHOL 135 04/04/2024   HDL 56 04/04/2024   LDLCALC 60 04/04/2024   TRIG 106 04/04/2024   CHOLHDL 2.3 03/14/2023    Significant Diagnostic Results in last 30 days:  No results found.  Assessment/Plan 1. Cerumen debris on tympanic membrane of both ears (Primary) - right ear pain x 1 day - UTA TM due to cerumen -  bilateral outer ears unremarkable - start debrox BID x 5 days  - flush ears when Debrox complete - warm compress prn for pain  2. Right ear pain - see above - if no improvement assess for shingles> UTD on Shingrix      Family/ staff Communication: plan discussed with nurse  Labs/tests ordered:  none        [1]  Allergies Allergen Reactions   Sulfa Antibiotics Other (See Comments)    Unknown reaction   "

## 2024-08-20 ENCOUNTER — Encounter: Payer: Self-pay | Admitting: Internal Medicine

## 2024-08-20 ENCOUNTER — Non-Acute Institutional Stay (SKILLED_NURSING_FACILITY): Admitting: Internal Medicine

## 2024-08-20 DIAGNOSIS — I2510 Atherosclerotic heart disease of native coronary artery without angina pectoris: Secondary | ICD-10-CM

## 2024-08-20 DIAGNOSIS — N183 Chronic kidney disease, stage 3 unspecified: Secondary | ICD-10-CM | POA: Diagnosis not present

## 2024-08-20 DIAGNOSIS — I69951 Hemiplegia and hemiparesis following unspecified cerebrovascular disease affecting right dominant side: Secondary | ICD-10-CM | POA: Diagnosis not present

## 2024-08-20 DIAGNOSIS — E1169 Type 2 diabetes mellitus with other specified complication: Secondary | ICD-10-CM | POA: Diagnosis not present

## 2024-08-20 DIAGNOSIS — I129 Hypertensive chronic kidney disease with stage 1 through stage 4 chronic kidney disease, or unspecified chronic kidney disease: Secondary | ICD-10-CM

## 2024-08-20 DIAGNOSIS — F339 Major depressive disorder, recurrent, unspecified: Secondary | ICD-10-CM

## 2024-08-20 DIAGNOSIS — I5032 Chronic diastolic (congestive) heart failure: Secondary | ICD-10-CM | POA: Diagnosis not present

## 2024-08-20 DIAGNOSIS — Z5181 Encounter for therapeutic drug level monitoring: Secondary | ICD-10-CM | POA: Diagnosis not present

## 2024-08-20 DIAGNOSIS — E1122 Type 2 diabetes mellitus with diabetic chronic kidney disease: Secondary | ICD-10-CM

## 2024-08-20 DIAGNOSIS — E782 Mixed hyperlipidemia: Secondary | ICD-10-CM | POA: Diagnosis not present

## 2024-08-20 DIAGNOSIS — F0153 Vascular dementia, unspecified severity, with mood disturbance: Secondary | ICD-10-CM

## 2024-08-20 DIAGNOSIS — I2583 Coronary atherosclerosis due to lipid rich plaque: Secondary | ICD-10-CM

## 2024-08-20 MED ORDER — FUROSEMIDE 40 MG PO TABS: ORAL_TABLET | ORAL | Status: AC

## 2024-08-20 NOTE — Assessment & Plan Note (Signed)
 Stable vascular dementia with depression.  Remains on Lexapro  10 mg a day.

## 2024-08-20 NOTE — Assessment & Plan Note (Signed)
 Last A1c of 6.1% on May 2025.  This indicates excellent outpatient control of diabetes.  She is on diet control.

## 2024-08-20 NOTE — Assessment & Plan Note (Signed)
 Right sided Hemi plegia.  She wears an orthotic on her right shoe to help with her gait.

## 2024-08-20 NOTE — Assessment & Plan Note (Signed)
 08/20/2024(1st qtr 2026) - lexapro  was decreased to 10 mg in November 2025. Will re-evaluate her depression during 2nd qtr 2026 to see if Lexapro  can be decreased further.

## 2024-08-20 NOTE — Assessment & Plan Note (Addendum)
 Continue her on her losartan  100 mg daily, Coreg  3.125 mg twice daily.  Last BUN and creatinine from June 10, 2024 of 19/1.06 respectively.  Updating her BMP on August 23, 2024

## 2024-08-20 NOTE — Assessment & Plan Note (Addendum)
 Pt with increased bilateral lower extremity edema including her thighs and her lower legs.  Will DC her chlorthalidone .  Start her on Lasix  40 mg twice daily x 3 days then start 40 mg daily thereafter.  Draw a BMP on Friday, August 23, 2024 to monitor her renal function given her CKD stage III A and to see if she needs supplemental potassium.  Continue current GDMT with losartan  100 mg daily, Coreg  3.125 mg twice daily.  Last echo April 2024 showed LVEF of 65-70%

## 2024-08-20 NOTE — Assessment & Plan Note (Signed)
 Stable.  Remains on aspirin  and 80 mg of Lipitor , 10 mg of Zetia 

## 2024-08-20 NOTE — Progress Notes (Signed)
 [facility] Routine Visit Progress Note    Location:  Other [facility]) Nursing Home Room Number: 518 A Place of Service:  SNF (31)   Cassidy Locus, DO   Patient Care Team: Cassidy Locus, DO as PCP - General (Internal Medicine)   Extended Emergency Contact Information Primary Emergency Contact: [facility] Address: [place] [facility], [name]: [phone] Relation: Daughter Secondary Emergency Contact: [facility] Mobile Phone: [phone] Relation: Sister   Goals of care: Advanced Directive information    08/14/2024   10:32 AM  Advanced Directives  Does Patient Have a Medical Advance Directive? Yes  Type of Estate Agent of [place];Living will  Does patient want to make changes to medical advance directive? No - Patient declined  Copy of Healthcare Power of Attorney in Chart? Yes - validated most recent copy scanned in chart (See row information)    CODE STATUS: Full Code   Chief Complaint  Patient presents with   Medical Management of Chronic Issues    Routine visit. Discuss need for eye exam or post pone if patient refuses.      HPI: Pt is a 84 y.o. female seen today for medical management of chronic disease. Also for bilateral LE Edema  [name] is an 84 year old female who is a resident of [facility]  She is a long-term care resident.  She was urgent admitted on November 28, 2023.  She had [facility] lived at [facility].  She has a prior history of stroke with resultant right foot drop for which she wears an orthotic to her right shoe, history of type 2 diabetes with CKD stage with a baseline creatinine 3A with a baseline creatinine of approximately 1.1-1.2, hypertension, mixed hyperlipidemia, history of major recurrent depression.  Patient's CNA was concerned about increasing lower extremity edema.  Nursing also notes the patient has had worsening edema  of her lower legs.  Patient is on chlorthalidone  at baseline.  Patient does drink lots of water.  Nursing notes that the patient does sit on the toilet for prolonged periods of time in order to try to urinate.  Patient herself does not have any complaints.   Past Medical History:  Diagnosis Date   Anginal pain    Aortic atherosclerosis    Cancer (HCC)    Carotid artery stenosis 02/01/2010   a.) Doppler 02/01/2010: 72% LICA and 60% RICA. b.) Doppler 12/30/2014: >70% LICA and 70% RICA. c.) Doppler 10/25/16: >70% Bilater ICAs   Chronic bilateral low back pain with left-sided sciatica    CKD (chronic kidney disease), stage III (HCC)    Coronary artery disease    a.) LHC 12/24/2014 -> small LAD system; diffuse CTO of LAD. 60% RCA. LM and LCx with no sig disease. no intervention; med mgmt.   Degenerative arthritis    Diastolic dysfunction    a.) TTE 09/20/2018: EF 55%, G1DD, mild LAE and RVE, triv-mild panvalvular regurgitation.   Dysarthria    GERD (gastroesophageal reflux disease)    Hepatic steatosis    History of kidney stones    History of stroke 11/16/2021   HLD (hyperlipidemia)    Hypertension    Kidney stone    Lumbar radiculopathy    Major depression in remission    PVD (peripheral vascular disease)    Renal cyst, right 06/02/2015   a.) CT 06/02/2015 --> 4.4 cm RIGHT renal mass; MRI  recommended. b.) MRI 06/16/2015 --> 3.8 x 4.2 x 4.0 cm proteinaceous/hemmorhagic cyst.   Subclavian steal syndrome    a.) LHC 12/24/2014 --> tight eccentric 90% ostial stenosis with damping.   T2DM (type 2 diabetes mellitus) [facility])    Past Surgical History:  Procedure Laterality Date   ABDOMINAL HYSTERECTOMY     AUGMENTATION MAMMAPLASTY Bilateral 1980   BACK SURGERY  09/2018   BICEPT TENODESIS  06/10/2021   Procedure: BICEPS TENODESIS;  Surgeon: Edie Norleen PARAS, MD;  Location: [facility] ORS;  Service: Orthopedics;;   CARDIAC CATHETERIZATION Left 12/24/2014   Procedure: LEFT HEARD CATHETERIZATION;  Location: [facility]; Surgeon: Norleen Molt, MD   CHOLECYSTECTOMY     COLONOSCOPY N/A 02/11/2016   Procedure: COLONOSCOPY;  Surgeon: Lamar ONEIDA Holmes, MD;  Location: [facility];  Service: Endoscopy;  Laterality: N/A;   IR CT HEAD LTD  11/16/2021   IR CT HEAD LTD  11/16/2021   IR INTRAVSC STENT CERV CAROTID W/O EMB-PROT MOD SED INC ANGIO  11/18/2021   IR PERCUTANEOUS ART THROMBECTOMY/INFUSION INTRACRANIAL INC DIAG ANGIO  11/16/2021   RADIOLOGY WITH ANESTHESIA N/A 11/16/2021   Procedure: IR WITH ANESTHESIA- CODE STROKE;  Surgeon: Radiologist, Medication, MD;  Location: [facility] OR;  Service: Radiology;  Laterality: N/A;   REVERSE SHOULDER ARTHROPLASTY Right 06/10/2021   Procedure: REVERSE SHOULDER ARTHROPLASTY;  Surgeon: Edie Norleen PARAS, MD;  Location: [facility] ORS;  Service: Orthopedics;  Laterality: Right;     Allergies[1]   Outpatient Encounter Medications as of 08/20/2024  Medication Sig   acetaminophen  (TYLENOL ) 325 MG tablet Take 650 mg by mouth 3 (three) times daily.   aspirin  81 MG chewable tablet Chew 1 tablet (81 mg total) by mouth daily.   atorvastatin  (LIPITOR ) 80 MG tablet Take 1 tablet (80 mg total) by mouth daily.   barrier cream (NON-SPECIFIED) CREA Apply 1 Application topically as needed (apply to buttocks to reduce risk of skin injury).   carvedilol  (COREG ) 3.125 MG tablet Take 3.125 mg by mouth 2 (two) times daily.   cholecalciferol (VITAMIN D3) 25 MCG (1000 UNIT) tablet Take 1,000 Units by mouth daily.   clopidogrel  (PLAVIX ) 75 MG tablet Take 1 tablet (75 mg total) by mouth daily.   docusate sodium  (COLACE) 100 MG capsule Take 100 mg by mouth 2 (two) times daily.   escitalopram  (LEXAPRO ) 10 MG tablet Take 1 tablet (10 mg total) by mouth daily.   ezetimibe  (ZETIA ) 10 MG tablet Take 1 tablet (10 mg total) by mouth daily.   furosemide  (LASIX ) 40 MG tablet Take 1 tablet (40 mg total) by mouth 2 (two) times daily for 3 days, THEN 1 tablet (40 mg total) daily.   losartan  (COZAAR ) 100 MG tablet Take  1 tablet (100 mg total) by mouth daily.   magnesium  chloride (SLOW-MAG) 64 MG TBEC SR tablet Take 1 tablet by mouth daily.   Multiple Vitamin (THEREMS PO) Take 1 tablet by mouth daily.   nitroGLYCERIN  (NITROSTAT ) 0.4 MG SL tablet Place 0.4 mg under the tongue every 5 (five) minutes as needed for chest pain.   pantoprazole  (PROTONIX ) 40 MG tablet Take 40 mg by mouth daily.   tizanidine  (ZANAFLEX ) 2 MG capsule Take 1 capsule (2 mg total) by mouth 3 (three) times daily.   [DISCONTINUED] chlorthalidone  (HYGROTON ) 25 MG tablet Take 1 tablet (25 mg total) by mouth daily.   guaifenesin (ROBITUSSIN) 100 MG/5ML syrup Take 5 mLs by mouth every 8 (eight) hours as needed for cough. (Patient not taking: Reported on 08/20/2024)   No  facility-administered encounter medications on file as of 08/20/2024.     Review of Systems  Constitutional: Negative.   HENT: Negative.    Eyes: Negative.   Respiratory: Negative.    Cardiovascular:  Positive for leg swelling.  Gastrointestinal: Negative.   Endocrine: Negative.   Genitourinary:  Positive for difficulty urinating.       Has to sit on the toilet for prolonged period in order to urinate.  Allergic/Immunologic: Negative.   Neurological: Negative.   Hematological: Negative.   Psychiatric/Behavioral: Negative.    All other systems reviewed and are negative.     Immunization History  Administered Date(s) Administered   INFLUENZA, HIGH DOSE SEASONAL PF 05/16/2019, 05/10/2020, 05/25/2021   Influenza Inj Mdck Quad Pf 05/02/2018   Influenza Split 05/26/2015   Influenza-Unspecified 05/01/2012, 05/12/2013, 06/08/2016, 04/28/2017, 05/26/2023, 05/21/2024   Moderna Covid-19 Vaccine Bivalent Booster 69yrs & up 05/25/2021   PFIZER Comirnaty(Gray Top)Covid-19 Tri-Sucrose Vaccine 08/15/2019, 09/07/2019, 05/10/2020   PFIZER(Purple Top)SARS-COV-2 Vaccination 08/15/2019, 09/07/2019, 05/02/2020, 11/25/2020, 05/05/2023   Pneumococcal Conjugate-13 05/26/2015   Pneumococcal  Polysaccharide-23 08/09/2011   Tdap 09/19/2023   Unspecified SARS-COV-2 Vaccination 05/31/2024   Zoster Recombinant(Shingrix) 08/30/2018, 01/03/2019   Zoster, Live 08/09/2011   Pertinent  Health Maintenance Due  Topic Date Due   OPHTHALMOLOGY EXAM  Never done   HEMOGLOBIN A1C  12/08/2024   FOOT EXAM  05/29/2025   Influenza Vaccine  Completed   Bone Density Scan  Completed      11/23/2021    8:00 PM 11/24/2021    9:00 AM 11/24/2021    8:10 PM 11/25/2021    8:00 AM 01/04/2024    8:58 AM  Fall Risk  Falls in the past year?     1  Was there an injury with Fall?     1   Fall Risk Category Calculator     3  (RETIRED) Patient Fall Risk Level Moderate fall risk  Moderate fall risk  Moderate fall risk  Moderate fall risk    Patient at Risk for Falls Due to     History of fall(s);Impaired balance/gait;Impaired mobility  Fall risk Follow up     Falls evaluation completed     Data saved with a previous flowsheet row definition   Functional Status Survey:     Vitals:   08/20/24 0934  BP: (!) 145/73  Pulse: 86  Resp: 20  Temp: 97.7 F (36.5 C)  SpO2: 95%  Weight: 184 lb (83.5 kg)  Height: 5' 2 (1.575 m)   Body mass index is 33.65 kg/m. Physical Exam Vitals and nursing note reviewed.  Constitutional:      General: She is not in acute distress.    Appearance: She is not diaphoretic.  HENT:     Head: Normocephalic and atraumatic.     Nose: Nose normal.  Cardiovascular:     Rate and Rhythm: Normal rate and regular rhythm.  Pulmonary:     Effort: Pulmonary effort is normal.     Breath sounds: Normal breath sounds.  Abdominal:     General: Abdomen is protuberant. Bowel sounds are normal.     Palpations: Abdomen is soft.  Musculoskeletal:     Right lower leg: Edema present.     Left lower leg: Edema present.     Comments: +1 pitting bilateral thigh edema +2 pitting bilateral pretibial edema  See pictures      Labs reviewed: Recent Labs    11/23/23 0837  11/26/23 1906 11/30/23 0000 04/22/24 0000 06/06/24 0000 06/10/24  0000  NA 136 134*   < > 138 137 137  K 3.4* 3.5   < > 4.0 4.0 4.3  CL 100 95*   < > 101 103 99  CO2 27 27   < > 29* 25* 31*  GLUCOSE 121* 138*  --   --   --   --   BUN 23 26*   < > 26* 20 19  CREATININE 1.20* 1.22*   < > 1.2* 1.0 1.1  CALCIUM  9.2 9.8   < > 9.3 9.2 9.6   < > = values in this interval not displayed.   Recent Labs    11/23/23 0837 06/06/24 0000 06/10/24 0000  AST 25 20 19   ALT 29 20 21   ALKPHOS 50 59 48  BILITOT 0.7  --   --   PROT 6.8  --   --   ALBUMIN 3.7 3.8 4.1   Recent Labs    11/23/23 0837 11/26/23 1906 11/30/23 0000 12/22/23 0000 06/06/24 0000 06/10/24 0000  WBC 9.4 10.8*   < > 9.3 7.8 8.2  NEUTROABS 6.2 7.1   < > 5,534.00 4,282.00 5,059.00  HGB 13.1 13.6   < > 13.2 12.5 12.9  HCT 39.1 39.5   < > 40 38 39  MCV 97.3 95.2  --   --   --   --   PLT 274 292   < > 315 271 283   < > = values in this interval not displayed.   No results found for: TSH Lab Results  Component Value Date   HGBA1C 6.1 06/10/2024   Lab Results  Component Value Date   CHOL 135 04/04/2024   HDL 56 04/04/2024   LDLCALC 60 04/04/2024   TRIG 106 04/04/2024   CHOLHDL 2.3 03/14/2023     Significant Diagnostic Results in last 30 days: No results found.   Assessment/Plan Encounter for medication titration 08/20/2024(1st qtr 2026) - lexapro  was decreased to 10 mg in November 2025. Will re-evaluate her depression during 2nd qtr 2026 to see if Lexapro  can be decreased further.   Chronic diastolic (congestive) heart failure (HCC) Pt with increased bilateral lower extremity edema including her thighs and her lower legs.  Will DC her chlorthalidone .  Start her on Lasix  40 mg twice daily x 3 days then start 40 mg daily thereafter.  Draw a BMP on Friday, August 23, 2024 to monitor her renal function Hernandez her CKD stage III A and to see if she needs supplemental potassium.  Continue current GDMT with losartan   100 mg daily, Coreg  3.125 mg twice daily.  Last echo April 2024 showed LVEF of 65-70%  Benign hypertension with CKD (chronic kidney disease) stage III (HCC) Continue her on her losartan  100 mg daily, Coreg  3.125 mg twice daily.  Last BUN and creatinine from June 10, 2024 of 19/1.06 respectively.  Updating her BMP on August 23, 2024  Controlled type 2 diabetes mellitus with stage 3 chronic kidney disease, without long-term current use of insulin  (HCC) Last A1c of 6.1% on May 2025.  This indicates excellent outpatient control of diabetes.  She is on diet control.  Mixed hyperlipidemia due to type 2 diabetes mellitus (HCC) Continue with Zetia  10 mg daily, Lipitor  80 mg a day  Major depression, recurrent, chronic Her Lexapro  was weaned from 20 mg--> 10 mg in November 2025.  Will reevaluate second quarter 2026 to see if her Lexapro  can be decreased any further.  Vascular dementia with depressed mood (HCC)  Stable vascular dementia with depression.  Remains on Lexapro  10 mg a day.  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  She wears an orthotic on her right shoe to help with her gait.  CAD (coronary artery disease) Stable.  Remains on aspirin  and 80 mg of Lipitor , 10 mg of Zetia     Meds ordered this encounter  Medications   furosemide  (LASIX ) 40 MG tablet    Sig: Take 1 tablet (40 mg total) by mouth 2 (two) times daily for 3 days, THEN 1 tablet (40 mg total) daily.   Medications Discontinued During This Encounter  Medication Reason   chlorthalidone  (HYGROTON ) 25 MG tablet Discontinued by provider   No orders of the defined types were placed in this encounter.   Camellia Door, DO [facility] [phone]     [1]  Allergies Allergen Reactions   Sulfa Antibiotics Other (See Comments)    Unknown reaction

## 2024-08-20 NOTE — Assessment & Plan Note (Signed)
 Her Lexapro  was weaned from 20 mg--> 10 mg in November 2025.  Will reevaluate second quarter 2026 to see if her Lexapro  can be decreased any further.

## 2024-08-20 NOTE — Assessment & Plan Note (Addendum)
 Continue with Zetia  10 mg daily, Lipitor  80 mg a day
